# Patient Record
Sex: Male | Born: 1955 | Race: White | Hispanic: No | Marital: Single | Smoking: Current every day smoker
Health system: Southern US, Community
[De-identification: ages and names within clinical notes are randomized; demographics above are authoritative.]

## PROBLEM LIST (undated history)

## (undated) DIAGNOSIS — D649 Anemia, unspecified: Secondary | ICD-10-CM

## (undated) DIAGNOSIS — K219 Gastro-esophageal reflux disease without esophagitis: Secondary | ICD-10-CM

## (undated) DIAGNOSIS — K769 Liver disease, unspecified: Secondary | ICD-10-CM

## (undated) DIAGNOSIS — K7469 Other cirrhosis of liver: Secondary | ICD-10-CM

## (undated) DIAGNOSIS — I10 Essential (primary) hypertension: Secondary | ICD-10-CM

## (undated) HISTORY — PX: LIVER BIOPSY: SHX301

## (undated) HISTORY — DX: Other cirrhosis of liver: K74.69

## (undated) HISTORY — PX: COLONOSCOPY: SHX174

## (undated) HISTORY — DX: Gastro-esophageal reflux disease without esophagitis: K21.9

## (undated) HISTORY — DX: Liver disease, unspecified: K76.9

## (undated) HISTORY — PX: UPPER GASTROINTESTINAL ENDOSCOPY: SHX188

---

## 2005-04-06 ENCOUNTER — Ambulatory Visit (HOSPITAL_COMMUNITY): Admission: RE | Admit: 2005-04-06 | Discharge: 2005-04-06 | Payer: Self-pay | Admitting: Internal Medicine

## 2005-04-24 ENCOUNTER — Ambulatory Visit: Payer: Self-pay | Admitting: Internal Medicine

## 2005-05-31 ENCOUNTER — Encounter (INDEPENDENT_AMBULATORY_CARE_PROVIDER_SITE_OTHER): Payer: Self-pay | Admitting: Internal Medicine

## 2005-05-31 ENCOUNTER — Ambulatory Visit (HOSPITAL_COMMUNITY): Admission: RE | Admit: 2005-05-31 | Discharge: 2005-05-31 | Payer: Self-pay | Admitting: Internal Medicine

## 2005-05-31 ENCOUNTER — Ambulatory Visit: Payer: Self-pay | Admitting: Internal Medicine

## 2005-12-05 ENCOUNTER — Ambulatory Visit (HOSPITAL_COMMUNITY): Admission: RE | Admit: 2005-12-05 | Discharge: 2005-12-05 | Payer: Self-pay | Admitting: Internal Medicine

## 2005-12-05 ENCOUNTER — Encounter (INDEPENDENT_AMBULATORY_CARE_PROVIDER_SITE_OTHER): Payer: Self-pay | Admitting: Specialist

## 2005-12-05 ENCOUNTER — Ambulatory Visit: Payer: Self-pay | Admitting: Internal Medicine

## 2005-12-06 ENCOUNTER — Ambulatory Visit (HOSPITAL_COMMUNITY): Payer: Self-pay | Admitting: Internal Medicine

## 2005-12-06 ENCOUNTER — Encounter (HOSPITAL_COMMUNITY): Admission: RE | Admit: 2005-12-06 | Discharge: 2006-01-05 | Payer: Self-pay | Admitting: Internal Medicine

## 2005-12-06 ENCOUNTER — Ambulatory Visit (HOSPITAL_COMMUNITY): Admission: RE | Admit: 2005-12-06 | Discharge: 2005-12-06 | Payer: Self-pay | Admitting: Internal Medicine

## 2005-12-07 ENCOUNTER — Ambulatory Visit (HOSPITAL_COMMUNITY): Admission: RE | Admit: 2005-12-07 | Discharge: 2005-12-07 | Payer: Self-pay | Admitting: Internal Medicine

## 2005-12-12 ENCOUNTER — Ambulatory Visit: Payer: Self-pay | Admitting: Internal Medicine

## 2005-12-27 ENCOUNTER — Ambulatory Visit (HOSPITAL_COMMUNITY): Admission: RE | Admit: 2005-12-27 | Discharge: 2005-12-27 | Payer: Self-pay | Admitting: Internal Medicine

## 2005-12-27 ENCOUNTER — Encounter (INDEPENDENT_AMBULATORY_CARE_PROVIDER_SITE_OTHER): Payer: Self-pay | Admitting: Specialist

## 2006-01-01 ENCOUNTER — Ambulatory Visit (HOSPITAL_COMMUNITY): Admission: RE | Admit: 2006-01-01 | Discharge: 2006-01-01 | Payer: Self-pay | Admitting: Internal Medicine

## 2006-01-05 ENCOUNTER — Ambulatory Visit (HOSPITAL_COMMUNITY): Admission: RE | Admit: 2006-01-05 | Discharge: 2006-01-05 | Payer: Self-pay | Admitting: Internal Medicine

## 2006-01-10 ENCOUNTER — Ambulatory Visit (HOSPITAL_COMMUNITY): Admission: RE | Admit: 2006-01-10 | Discharge: 2006-01-10 | Payer: Self-pay | Admitting: Internal Medicine

## 2006-01-18 ENCOUNTER — Ambulatory Visit: Payer: Self-pay | Admitting: Internal Medicine

## 2006-01-18 ENCOUNTER — Ambulatory Visit (HOSPITAL_COMMUNITY): Admission: RE | Admit: 2006-01-18 | Discharge: 2006-01-18 | Payer: Self-pay | Admitting: Internal Medicine

## 2006-01-23 ENCOUNTER — Ambulatory Visit: Payer: Self-pay | Admitting: Internal Medicine

## 2006-01-28 ENCOUNTER — Ambulatory Visit (HOSPITAL_COMMUNITY): Admission: RE | Admit: 2006-01-28 | Discharge: 2006-01-28 | Payer: Self-pay | Admitting: Internal Medicine

## 2006-02-01 ENCOUNTER — Ambulatory Visit (HOSPITAL_COMMUNITY): Admission: RE | Admit: 2006-02-01 | Discharge: 2006-02-01 | Payer: Self-pay | Admitting: Internal Medicine

## 2006-02-05 ENCOUNTER — Ambulatory Visit (HOSPITAL_COMMUNITY): Admission: RE | Admit: 2006-02-05 | Discharge: 2006-02-05 | Payer: Self-pay | Admitting: Internal Medicine

## 2006-02-18 ENCOUNTER — Ambulatory Visit: Payer: Self-pay | Admitting: Internal Medicine

## 2006-02-18 ENCOUNTER — Ambulatory Visit (HOSPITAL_COMMUNITY): Admission: RE | Admit: 2006-02-18 | Discharge: 2006-02-18 | Payer: Self-pay | Admitting: Internal Medicine

## 2006-03-05 ENCOUNTER — Ambulatory Visit (HOSPITAL_COMMUNITY): Admission: RE | Admit: 2006-03-05 | Discharge: 2006-03-05 | Payer: Self-pay | Admitting: Internal Medicine

## 2006-03-18 ENCOUNTER — Ambulatory Visit (HOSPITAL_COMMUNITY): Admission: RE | Admit: 2006-03-18 | Discharge: 2006-03-18 | Payer: Self-pay | Admitting: Internal Medicine

## 2006-03-29 ENCOUNTER — Ambulatory Visit: Payer: Self-pay | Admitting: Internal Medicine

## 2006-03-29 ENCOUNTER — Ambulatory Visit (HOSPITAL_COMMUNITY): Admission: RE | Admit: 2006-03-29 | Discharge: 2006-03-29 | Payer: Self-pay | Admitting: Internal Medicine

## 2006-04-05 ENCOUNTER — Ambulatory Visit (HOSPITAL_COMMUNITY): Admission: RE | Admit: 2006-04-05 | Discharge: 2006-04-05 | Payer: Self-pay | Admitting: Gastroenterology

## 2006-04-12 ENCOUNTER — Ambulatory Visit: Payer: Self-pay | Admitting: Internal Medicine

## 2006-04-12 ENCOUNTER — Ambulatory Visit (HOSPITAL_COMMUNITY): Admission: RE | Admit: 2006-04-12 | Discharge: 2006-04-12 | Payer: Self-pay | Admitting: Internal Medicine

## 2006-04-25 ENCOUNTER — Ambulatory Visit: Payer: Self-pay | Admitting: Internal Medicine

## 2006-05-02 ENCOUNTER — Inpatient Hospital Stay (HOSPITAL_COMMUNITY): Admission: EM | Admit: 2006-05-02 | Discharge: 2006-05-04 | Payer: Self-pay | Admitting: Emergency Medicine

## 2006-05-17 ENCOUNTER — Ambulatory Visit (HOSPITAL_COMMUNITY): Admission: RE | Admit: 2006-05-17 | Discharge: 2006-05-17 | Payer: Self-pay | Admitting: Internal Medicine

## 2006-05-28 ENCOUNTER — Ambulatory Visit: Payer: Self-pay | Admitting: Internal Medicine

## 2006-06-20 ENCOUNTER — Ambulatory Visit: Payer: Self-pay | Admitting: Internal Medicine

## 2006-07-11 ENCOUNTER — Ambulatory Visit: Payer: Self-pay | Admitting: Internal Medicine

## 2006-08-06 HISTORY — PX: TIPS PROCEDURE: SHX808

## 2006-09-20 ENCOUNTER — Ambulatory Visit: Payer: Self-pay | Admitting: Internal Medicine

## 2006-09-24 ENCOUNTER — Ambulatory Visit (HOSPITAL_COMMUNITY): Admission: RE | Admit: 2006-09-24 | Discharge: 2006-09-24 | Payer: Self-pay | Admitting: Internal Medicine

## 2006-12-12 ENCOUNTER — Ambulatory Visit (HOSPITAL_COMMUNITY): Admission: RE | Admit: 2006-12-12 | Discharge: 2006-12-12 | Payer: Self-pay | Admitting: Internal Medicine

## 2007-01-09 ENCOUNTER — Ambulatory Visit: Payer: Self-pay | Admitting: Internal Medicine

## 2007-11-07 ENCOUNTER — Ambulatory Visit (HOSPITAL_COMMUNITY): Admission: RE | Admit: 2007-11-07 | Discharge: 2007-11-07 | Payer: Self-pay | Admitting: Internal Medicine

## 2007-11-12 ENCOUNTER — Ambulatory Visit (HOSPITAL_COMMUNITY): Admission: RE | Admit: 2007-11-12 | Discharge: 2007-11-12 | Payer: Self-pay | Admitting: Internal Medicine

## 2007-12-18 ENCOUNTER — Ambulatory Visit (HOSPITAL_COMMUNITY): Admission: RE | Admit: 2007-12-18 | Discharge: 2007-12-18 | Payer: Self-pay | Admitting: Internal Medicine

## 2008-10-28 ENCOUNTER — Encounter: Payer: Self-pay | Admitting: Urgent Care

## 2008-12-27 ENCOUNTER — Ambulatory Visit (HOSPITAL_COMMUNITY): Admission: RE | Admit: 2008-12-27 | Discharge: 2008-12-27 | Payer: Self-pay | Admitting: Internal Medicine

## 2009-09-15 ENCOUNTER — Ambulatory Visit (HOSPITAL_COMMUNITY): Admission: RE | Admit: 2009-09-15 | Discharge: 2009-09-15 | Payer: Self-pay | Admitting: Internal Medicine

## 2010-08-27 ENCOUNTER — Encounter: Payer: Self-pay | Admitting: Internal Medicine

## 2010-12-19 NOTE — Assessment & Plan Note (Signed)
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   SUBJECTIVE:  The patient is a 55 year old Caucasian male patient of Dr.  Ouida Sills, who is here for a scheduled visit.  He was last seen on September 20, 2006.  He has no complaints.  Review of his weight record reveals  that he has gained another 14 pounds.  All in all, he has gained 23  pounds this year.  He admits to not doing much physical activity but  eating a lot.  He is due for his hemoglobin A1c in the a.m. per Dr.  Ouida Sills.  He states he only had a mild episode of nausea since his last  visit, which was about 4 weeks ago.  He continues to complain of  insomnia.  When he was seen at [facility] he was given a  prescription for Ambien, which did not help.  He took 3 doses and  stopped.  He also complains of bloating and cramps and nausea with  lactulose.  He is taking less than the recommended dose.  He has not had  any episodes of confusion.  He denies melena or rectal bleeding.   He is on:  1. Spironolactone 100 mg daily.  2. Lasix 40 mg q.a.m.  3. Noroxin 400 mg daily.  4. B complex daily.  5. Lactulose 30 mL b.i.d.   PAST MEDICAL HISTORY:  1. He is diabetic, presently on diet.  2. Cirrhosis was diagnosed in 2006.  His hepatitis C antibody was      positive but HCV RNA repeatedly has been negative.  Other      biochemical markers were negative.  It was felt he has cryptogenic      cirrhosis.  He developed ascites requiring multiple taps,      eventually leading to TIPS in September 2007, and he has done      remarkably well.  He has had reversal of his muscle wasting.  3. History of mild hypertension.   ALLERGIES:  No known.   OBJECTIVE:  VITAL SIGNS:  Weight 241 pounds.  He is 6 feet tall.  Pulse  64 per  minute, blood pressure 130/72, temperature is 98.3.  GENERAL:  He does not have asterixis.  HEENT:  Conjunctivae are pink.  Sclerae are nonicteric.  Oropharyngeal  mucosa is normal.  Dentition in satisfactory condition.  NECK:  No neck masses or thyromegaly noted.  CHEST:  He does not have spider angiomata but he has bilateral  gynecomastia.  No tenderness noted on breast exam.  LUNGS:  Clear to auscultation.  CARDIAC:  Regular rhythm, normal S1 and S3.  No murmur or gallop noted.  ABDOMEN:  Full, soft and nontender.  Spleen is not palpable.  Liver edge  is indistinct, 5 cm below RCM.  Left lobe of liver is also prominent.  No shifting dullness noted.  It is soft and nontender.  He has  hepatomegaly with prominent left lobe.  He has inferior margin 5-6 cm  below RCM on inspiration.  Liver is nontender.  Spleen is not palpable.  EXTREMITIES:  He does not have shifting dullness or peripheral edema.   Hepatic ultrasound performed Dec 12, 2006:  Cirrhotic-appearing liver  with mild hepatomegaly, mild splenomegaly, cholelithiasis.  A Doppler  was also performed.  Velocities were as follows:  Proximal 204 cm/sec.,  on previous study of October was 150; mid 180 cm/sec., on previous study  was 198; and distally was 161 cm/sec., and previously was 193.  Slight  increase in velocity noted in proximal portion of TIPS compared to  previous study.  Distally it was slightly decreased.  It was felt to be  insignificant.  No ascites was noted on this study.  Similarly, no  ascites noted in this study.   ASSESSMENT:  Cryptogenic cirrhosis complicated by refractory ascites,  status post TIPS placement September 2007.  He has done extremely well  since then, although he had some difficulty with hepatic encephalopathy.  He is not taking his lactulose as recommended because of side effects.   PLAN:  Patient advised to take lactulose at least 30 mL a day.  New  prescription given for spironolactone 100 mg  daily, 30 with refills, for  Lasix 40 mg q.a.m., 30 with 5 refills, and Noroxin 400 mg, 30 with 5  refills.  He will have CBC, Chem-20, INR and serum ammonia with his  blood work in the a.m.  We will also check his alpha-fetoprotein.   Since he has an appointment to be seen at [facility] in August, I  will plan to see him in October 2008 unless new issues arise.       Lionel December, M.D.  Electronically Signed     NR/MEDQ  D:  01/09/2007  T:  01/10/2007  Job:  [id number]   cc:   Kingsley Callander. Ouida Sills, MD  Juel Burrow, PSE

## 2010-12-22 NOTE — Op Note (Signed)
NAME:  [name], [name]               ACCOUNT NO.:  [id number]   MEDICAL RECORD NO.:  [id number]          PATIENT TYPE:  AMB   LOCATION:  DAY                           FACILITY:  [facility]   PHYSICIAN:  Lionel December, M.D.    DATE OF BIRTH:  01-19-1956   DATE OF PROCEDURE:  02/01/2006  DATE OF DISCHARGE:                                 OPERATIVE REPORT   PROCEDURE:  Esophagogastroduodenoscopy.   INDICATIONS:  [name] [name] is a 55 year old Caucasian male with advanced cirrhosis  who is undergoing EGD looking for esophageal and/or gastric varices.  The  procedure risks were reviewed with the patient and informed consent was  obtained.   MEDS FOR CONSCIOUS SEDATION:  Benzocaine spray for pharyngeal topical  anesthesia, Demerol 50 mg IV, Versed 5 mg IV in divided dose.   FINDINGS:  The procedure was performed in the endoscopy suite.  The  patient's vital signs and O2 sat were monitored during the procedure and  remained stable.  The patient was placed in the left lateral position.  The  Olympus videoscope was passed via oropharynx without any difficulty into  esophagus.   Esophagus:  The mucosa of the proximal and middle segments was normal.  Distal 7-8 cm had four columns of esophageal varices, one at 6 o'clock was  most prominent, but no more than grade II, others were smaller and grade 1.  GE junction was located at 37 cm from the incisors.  No hernia was noted.   Stomach.  It was empty and distended very well insufflation.  The folds of  the proximal stomach were normal.  Examination of the mucosa revealed mosaic  pattern of the gastric body with focal patchy erythema with no erosions  and/or ulcers noted.  Pyloric channel was patent.  Angularis, fundus and  cardia were examined by retroflexing the scope and were normal.  No fundal  varices were identified.   Duodenum:  The bulbar mucosa was normal.  The scope was passed in the second  part of the duodenum where mucosa and folds were normal.   The endoscope was  withdrawn.  The patient tolerated the procedure well.   FINAL DIAGNOSIS:  1.  Four columns of grade 1 to II esophageal varices.  2.  Mild ported gastropathy but no evidence of fundal varices.   RECOMMENDATIONS:  He will resume his usual meds.  We will do an H. pylori  next time he has blood work to out H. pylori gastritis.   PLAN:  He will resume his usual meds.  We will do an H. pylori serology when  he has his next blood work.      Lionel December, M.D.  Electronically Signed     NR/MEDQ  D:  02/01/2006  T:  02/01/2006  Job:  [id number]   cc:   Kingsley Callander. Ouida Sills, MD  Fax: [phone]   Satira Mccallum, M.D.

## 2010-12-22 NOTE — Group Therapy Note (Signed)
NAME:  [name] [name], [name] [name]               ACCOUNT NO.:  [id number]   MEDICAL RECORD NO.:  [id number]          PATIENT TYPE:  INP   LOCATION:  A311                          FACILITY:  [facility]   PHYSICIAN:  Angus G. Renard Matter, MD   DATE OF BIRTH:  08-24-1955   DATE OF PROCEDURE:  DATE OF DISCHARGE:                                   PROGRESS NOTE   This patient has a history of cirrhosis, admitted with dizziness and  weakness.  He underwent TIPS surgery for cirrhosis and ascites.  Apparently  is feeling some better.  His liver enzymes were repeated; bilirubin 2.6,  SGOT 98, SGPT 55, total protein 7.1, albumin 2.1.   OBJECTIVE:  VITAL SIGNS:  Blood pressure 118/68, respirations 20, pulse 79,  temp 97.2.  Blood sugars range from 115 to 162.  There is no generalized  weakness.  LUNG:  Clear to P and A.  HEART:  Regular rhythm.  ABDOMEN:  There is abdominal distension.   ASSESSMENT:  Patient has a history of cirrhosis, elevated liver enzymes,  history of cholelithiasis, and elevated ammonia level without hepatic  encephalopathy.   PLAN:  I plan to continue a 4 g sodium diet, 60 to 70 g protein diet, Lasix  40 mg daily.  There is a possibility patient could be discharged over the  weekend.      Angus G. Renard Matter, MD  Electronically Signed     AGM/MEDQ  D:  05/04/2006  T:  05/06/2006  Job:  [id number]

## 2010-12-22 NOTE — Discharge Summary (Signed)
NAME:  [name] [name], [name] [name]               ACCOUNT NO.:  [id number]   MEDICAL RECORD NO.:  [id number]          PATIENT TYPE:  INP   LOCATION:  A311                          FACILITY:  [facility]   PHYSICIAN:  Kingsley Callander. Ouida Sills, MD       DATE OF BIRTH:  April 16, 1956   DATE OF ADMISSION:  05/02/2006  DATE OF DISCHARGE:  09/29/2007LH                                 DISCHARGE SUMMARY   DISCHARGE DIAGNOSES:  1. Cirrhosis.  2. Ascites, status post transjugular intrahepatic portosystemic shunt.  3. Type 2 diabetes.  4. Nausea and vomiting.   HOSPITAL COURSE:  This patient is a 55 year old male with cirrhosis who  presented with recurrent nausea and vomiting with weakness and dizziness.  White count was 11.4, temperature was 98, hemoglobin was 12, ammonia level  was elevated at 96. Had recently been started on lactulose. Felt that this  was contributing to his recurrent vomiting. Sodium was 133, potassium was  4.1. Liver function studies were elevated with an initial bilirubin of 2.1  with an SGOT of 52 and SGPT of 25. He had recently undergone a TIPS at  [facility] He was hospitalized and treated with antiemetics (Zofran) and  Protonix. Lactulose was continued in light of his elevated ammonia level. He  was seen in GI consultation by Dr. Karilyn Cota. A repeat white count was 11.7.  Liver function studies increased further with a rise in SGOT to 80 then 98  and a rise in his SGPT to 37 then 55. His bilirubin rose to 3.4 then dropped  to 2.6 by discharge. His ammonia level improved to 59.   His chest x-ray was normal. His INR was normal at 1.1. He was continued on  spironolactone and Lasix and Noroxin. Symptomatically much improved on the  29th and was felt to be stable for discharge.   DISCHARGE MEDICATIONS:  1. Aldactone 50 mg daily.  2. Lasix 20 mg daily.  3. Noroxin 400 mg daily.  4. Protonix 40 mg daily.  5. Lactulose 30 cc b.i.d.   He will be seen in followup by Dr. Karilyn Cota in 7-10 days. He was advised to  consume a 4 gm sodium, 70 gm protein diet.      Kingsley Callander. Ouida Sills, MD  Electronically Signed     ROF/MEDQ  D:  05/15/2006  T:  05/15/2006  Job:  [id number]   cc:   Lionel December, M.D.  [address]  [place] [address]

## 2010-12-22 NOTE — Op Note (Signed)
NAME:  [name], [name]               ACCOUNT NO.:  [id number]   MEDICAL RECORD NO.:  [id number]          PATIENT TYPE:  AMB   LOCATION:  DAY                           FACILITY:  [facility]   PHYSICIAN:  Lionel December, M.D.    DATE OF BIRTH:  May 27, 1956   DATE OF PROCEDURE:  02/05/2006  DATE OF DISCHARGE:                                 OPERATIVE REPORT   PROCEDURE:  Large Volume Abdominal Paracentasis.  [name] [name] is a 55 year old Caucasian male with biopsy-proven cirrhosis, possibly  secondary to prior hepatitis C and alcohol use, who presents with recurrent  ascites.  He is being evaluated at [facility] as he is a candidate for  OLT.  He is back on his spironolactone and Lasix but cannot tell any  difference.  Dr. Hermelinda Medicus is planning to increase diuretic therapy if his  renal function and potassium are normal.  He just had blood work done by  them yesterday.  He is back with tense abdomen and would like to have some  fluid removed.  His last tap was on January 28, 2006, when 10 L of fluid was  removed.  His white cell count, which initially was 600 in May and peaked to  1100, was down to 200, and he only had 8% neutrophils.   This is [name] [name]'s seventh abdominal paracentesis.  The Procedure and risks were  reviewed the patient, informed consent was obtained.   FINDINGS:  Procedure performed in day hospital.  The patient was placed in  supine position and tilted onto the right side by placing pillow on the left  flank.  Skin in the right midabdomen was prepped in the usual fashion with  Betadine solution and isolated using a sterile sheet.  Xylocaine 1% was  injected using a 25-gauge needle.  There was some oozing from the abdominal  wall.  Therefore, another site was picked just below that.  There was no  bleeding from this site.  Xylocaine 1% was injected down to parietal  peritoneum.  A skin-deep incision was made with scalpel blade and a 14-gauge  Caldwell needle was introduced into  peritoneal cavity without any difficulty  with free flow of creamy, hazy fluid just like before.  A sample was taken  and sent to the lab for routine studies.  A total of 10 L of fluid was  removed without any difficulty.  Puncture site was covered with Band-Aid and  pressure dressing was applied.  The patient tolerated the procedure well.   FINAL DIAGNOSES:  1.  Cirrhotic ascites, difficult to manage medically.  2.  Status post with removal of 10 L of fluid.   PLAN:  Fluid sent to the lab for cell count, differential, Gram stain,  aerobic and anaerobic cultures in blood culture bottles.  The patient can  remove pressure dressing in a.m.  He will continue to watch intake of salt.   He will call us should he reach a point that he feels he needs to have fluid  removed again.      Lionel December, M.D.  Electronically Signed  NR/MEDQ  D:  02/05/2006  T:  02/05/2006  Job:  [id number]   cc:   Kingsley Callander. Ouida Sills, MD  Fax: [phone]   Satira Mccallum, M.D.  Hepatology Section, [facility]

## 2010-12-22 NOTE — Op Note (Signed)
NAME:  [name] [name], [name] [name]               ACCOUNT NO.:  [id number]   MEDICAL RECORD NO.:  [id number]          PATIENT TYPE:  AMB   LOCATION:  DAY                           FACILITY:  [facility]   PHYSICIAN:  Lionel December, M.D.    DATE OF BIRTH:  1956/02/17   DATE OF PROCEDURE:  03/05/2006  DATE OF DISCHARGE:                                 OPERATIVE REPORT   PROCEDURE:  Large-volume abdominal paracentesis (10th tap since Dec 05, 2005).   [name] is 55 year old Caucasian male with cirrhotic ascites which has been  difficult to control with solid restriction and diuretic therapy.  His  diuretic therapy is currently being managed through hepatology Clinic at [facility]  however it is not working.  The patient called yesterday and requested that  fluid be removed for symptomatic relief.  He cannot rest and cannot sleep at  night because he feels tight and has a difficult time taking a deep breath.  He has not experienced fever, chills, nausea or vomiting.  He is being  evaluated for TIPS although he is not sure that he wants to go that route  given the risks.  The patient states that he needs to have fluid removed and  cannot wait any longer.   The procedure risks were reviewed with the patient and informed consent was  obtained.   FINDINGS:  The procedure performed in day hospital.  The patient was placed  in supine position.  He was noted to have tense ascites with positive fluid  thrill.  He was tilted onto the right side, placing a pillow under the left  flank.  The skin and left mid abdomen was prepped in the usual fashion with  Betadine solution and isolated using a sterile sheet.  1% Xylocaine was  injected in the skin and subcutaneous tissue down to parietal peritoneum.  A  total of 2 mL was injected.  A small skin deep incision was made with a  scalpel blade.  A 14-gauge Caldwell needle was introduced into the  peritoneal cavity without any difficulty with free flow of hazy greenish-  yellow  fluid just like before.  60 mL was saved and sent to the lab for  routine studies.  A total of 10 liters of fluid was removed without any  difficulty.  The patient's abdomen was much softer and he felt better.  The  puncture site was covered with a Band-Aid and a pressure dressing was  applied.  The patient tolerated the procedure well.   FINAL DIAGNOSIS:  Cirrhotic ascites refractory to medical therapy.  Status  post LVAP with removal of 10 liters of fluid.  Please note his last tap was  on February 18, 2006.   PLAN:  Fluid sent to the lab for cell count, Dif, Gram's stain, aerobic,  anaerobic cultures in blood culture bottles.  Next he will remove the  pressure dressing after 24 hours.   He will continue his planned visit to [facility] in the next few  __________.      Lionel December, M.D.  Electronically Signed  NR/MEDQ  D:  03/05/2006  T:  03/05/2006  Job:  [id number]   cc:   Kingsley Callander. Ouida Sills, MD  Fax: [phone]   Doretha Sou, PA-C  Hepatology Clinic, [facility]

## 2010-12-22 NOTE — Consult Note (Signed)
NAME:  [name] [name], [name] [name]               ACCOUNT NO.:  [id number]   MEDICAL RECORD NO.:  [id number]          PATIENT TYPE:  INP   LOCATION:  A311                          FACILITY:  [facility]   PHYSICIAN:  Lionel December, M.D.    DATE OF BIRTH:  September 13, 1955   DATE OF CONSULTATION:  05/03/2006  DATE OF DISCHARGE:                                   CONSULTATION   REASON FOR CONSULTATION:  Cirrhosis, nausea/vomiting.   REQUESTING PHYSICIAN:  Kingsley Callander. Ouida Sills, MD   Physician co-signing note is Dr. Lionel December.   HISTORY OF PRESENT ILLNESS:  Patient is a 55 year old Caucasian gentleman  with history of cirrhosis, who recently underwent TIPS on April 17, 2006  for refractory ascites.  He has undergone numerous large volume abdominal  paracentesis since approximately May of this year.  His post-TIPS placement  he has done fairly well, except for some early dizziness and weakness, which  subsided after a couple of days.  He was seen in the office by Dr. Karilyn Cota  late last week, felt to be doing well.  Over the weekend, he started having  increased dizziness and weakness.  He states he fell a couple of times,  because he lost his balance.  On Wednesday, after he started taking  Lactulose, he started having nausea and vomiting.  He has had several  episodes over the course of the last two days.  Denies any hematemesis.  Lactulose was started after elevated ammonia level of 77, was detected on  May 01, 2006.  Patient generally has one bowel movement daily.  He has  had seven doses of Lactulose and has not had any loose stools or increase in  frequency.  Denies any melena or bright red blood per rectum.  He feels a  little bloated.  Denies any abdominal pain.  Been eating well up until a  couple of days ago.   MEDICATIONS:  At home:  1. Lactulose 30 cc b.i.d. started on Wednesday.  2. Aldactone 100 mg b.i.d.  3. Lasix 40 mg t.i.d.  4. Noroxin 400 mg q.d.  5. Protonix 40 mg q.d.   ALLERGIES:   NO KNOWN DRUG ALLERGIES.   PAST MEDICAL HISTORY:  Diagnosed with cirrhosis late 2006.  He does have a  history of hepatitis C antibody which positive, however his HCV RNA came  back negative.  No prior history of heavy alcohol use and no alcohol use at  all in almost two years.  He is currently being evaluated at [facility].  Recent  TIPS placement as outlined above.  History of diabetes mellitus controlled  with diet and borderline hypertension.   FAMILY HISTORY:  Mother in good health.  Father has coronary artery disease,  deceased at age 74, was also diabetic.  He has two sisters who are good  health.   SOCIAL HISTORY:  Has never been married.  Does not have any children.  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  History of  social  alcohol use but none in almost two years, no steady alcohol intake in the  past.   REVIEW OF SYSTEMS:  See HPI for GI and constitutional.  CARDIOPULMONARY:  He  has early a.m. cough productive of white phlegm, no shortness of breath.  GU:  No dysuria or hematuria.   PHYSICAL EXAMINATION:  VITAL SIGNS:  T-Max 99.1, T-current 97.5, pulse 83,  respirations 16, blood pressure 91/50, weight 88.8 kg, height 72 inches.  GENERAL:  Pleasant, chronically ill-appearing Caucasian male in no acute  distress.  SKIN:  Warm and dry, no jaundice.  HEENT:  Sclerae nonicteric.  Oropharyngeal mucosa moist.  No  lymphadenopathy, thyromegaly.  CHEST:  Multiple spider angiomata over the anterior chest.  Lungs are clear  to auscultation.  CARDIAC:  Reveals regular rate and rhythm.  No murmurs, rubs or gallops.  ABDOMEN:  Full but symmetrical, positive bowel sounds.  Abdomen is very  soft, prominent to the left lobe of the liver, easily palpated 6 cm below  the right costal margin and in inspiration.  Spleen tip is also easily  palpable.  No hernia noted and no abdominal  bruits. No  significant ascites.  EXTREMITIES:  Trace pedal edema bilaterally.   LABORATORY:  White count 11,700, hemoglobin 11.3, hematocrit 31.5, platelets  182,000, eosinophil count 7%, sodium 135, potassium 4.3, BUN 22, creatinine  1.4, glucose 139.2.  Bilirubin was 1.3, two days ago, 2.1 yesterday, 3.4  today, and direct bilirubin today is 2.3.  His alkaline phosphatase went  from 130-145, AST was normal, 52 yesterday, 80 today.  ALT was normal, went  to 25 and 37.  Albumin 1.9.  Ammonia went from 96 down to 59.  Lipase is 40.  Chest x-ray reveals no acute disease.   IMPRESSION:  [name] [name] is 55 year old gentleman with a history of cirrhosis,  possibly related to prior HCV versus cryptogenic.  He presents with  weakness, dizziness, nausea, vomiting of two days duration.  Symptoms have  worsened after beginning Lactulose 2 days ago.  Denies any significant  abdominal pain.  It is not clear whether his nausea/vomiting is related to  Lactulose, although patient subjectively feels that he is.  Somewhat  concerning is the changes in his LFT parameters with increasing  transaminases from normal, and increased total bilirubin, although this was  mostly indirect and could be related to his cirrhosis rather than biliary  obstruction.  He does have a history of cholelithiasis.  However, given the  patient not having any significant abdominal  pain, this makes acute  cholecystectomy on biliary etiology less likely.  He has an elevated ammonia  level on admission which is improved; however, patient has not had any signs  or symptoms or hepatic encephalopathy.  He does have a history of positive  cell count on abdominal paracentesis back in May of 2007, but cultures were  negative.  He did receive Rocephin, and more recently his cell counts have  been fine.  He had a very low-grade temp of 99 his admission but otherwise  no other symptoms suggestive of spontaneous bacterial peritonitis.   RECOMMENDATION: 1. We  will be discussing this case with Dr. Karilyn Cota this morning.  We may      need to consider abdominal ultrasound to rule out acute cholecystitis.      Patient tells me he is also due to have his TIPS re-evaluated via      ultrasound next week, and we may facilitate that at the same  time.  2. Will recheck his LFTs in the morning.  3. Further recommendations to follow.      Tana Coast, P.A.      Lionel December, M.D.  Electronically Signed    LL/MEDQ  D:  05/03/2006  T:  05/03/2006  Job:  [id number]   cc:   Kingsley Callander. Ouida Sills, MD  Fax: [phone]

## 2010-12-22 NOTE — Op Note (Signed)
NAME:  [name] [name], [name] [name]               ACCOUNT NO.:  [id number]   MEDICAL RECORD NO.:  [id number]          PATIENT TYPE:  AMB   LOCATION:  DAY                           FACILITY:  [facility]   PHYSICIAN:  [name] [name], M.D.    DATE OF BIRTH:  September 27, 1955   DATE OF PROCEDURE:  12/27/2005  DATE OF DISCHARGE:                                 OPERATIVE REPORT   PROCEDURE:  Large-volume abdominal paracentesis.   INDICATIONS:  [name] [name] is a 55 year old Caucasian male with a biopsy-proven  cirrhosis who presented with new onset of ascites 3 weeks ago.  He had  elevated white cell count.  He was treated with IV Rocephin however, his  white cell count went up to 1100. Cultures have remained negative.  We asked  the lab to keep cultures for 2 weeks and they were cell negative. AFB  cultures are still pending although smear was negative.  He has been  maintained on low-dose Lasix and spironolactone.  His ascites has  reaccumulated and he feels miserable.  He is undergoing repeat tap both for  diagnostic and therapeutic purposes.  Procedure risks were reviewed with the  patient, informed consent was obtained.   FINDINGS:  Procedure performed in day hospital.  The patient was placed in  supine position and tilted onto the left side by placing pillow under the  right flank.  Skin in left mid abdomen was prepped in the usual fashion with  Betadine solution and isolated using sterile sheet.  1% Xylocaine was  injected in skin and subcutaneous tissue with 25-gauge needle and deeper  injection was made was made with 22 gauge needle.  Total of 3 mL of  Xylocaine was injected.  Small skin deep incision was made with scalpel  blade.  A 14 gauge Caldwell needle was introduced in peritoneal cavity  without any difficulty.  There was free flow of yellowish brown hazy fluid  just like before.  Sample was saved and sent to lab.  Total of 9 liters of  fluid was removed.  Puncture site was covered with Band-Aid and  pressure  dressing was applied.  The patient's abdomen was much softer and he felt  better.   FINAL DIAGNOSIS:  Recurrent ascites of recent onset with elevated white  cells but negative cultures status post LV AP with removal of 9 liters of  fluid.   I am concerned that he could have atypical infection.  His serum albumin few  weeks ago was 3.4. His Doppler ultrasound was negative for veno occlusive  disease or portal vein thrombosis.   PLAN:  Fluid sent to the lab for routine studies and also for repeat  cytology.   MET-7 was done along with HCV RNA by PCR qualitative.   He will be scheduled for abdominopelvic CT next week.   The patient was advised to increase his spironolactone to 100 mg b.i.d.   However, his serum potassium was reported to be 5.8.  Sodium was 130 and  creatinine was 1.3.  I contacted the patient and asked him to discontinue  his Lasix and spironolactone.  The patient will also be undergoing EGD at some point in the future to find  out if he has varices and what grade.  We will also send the paperwork for  him to be seen at transplant center which would be a [facility].   He may need that diagnostic peritoneoscopy and he may also benefit from  TIPS.      [name] [name], M.D.  Electronically Signed     NR/MEDQ  D:  12/27/2005  T:  12/28/2005  Job:  [id number]   cc:   [name] [name]. [name] Sills, MD  Fax: [phone]

## 2010-12-22 NOTE — Op Note (Signed)
NAME:  [name] [name], [name] [name]               ACCOUNT NO.:  [id number]   MEDICAL RECORD NO.:  [id number]          PATIENT TYPE:  AMB   LOCATION:  DAY                           FACILITY:  [facility]   PHYSICIAN:  Kassie Mends, M.D.      DATE OF BIRTH:  [birth year]   DATE OF PROCEDURE:  04/05/2006  DATE OF DISCHARGE:                                 OPERATIVE REPORT   PROCEDURE:  Large volume abdominal paracentesis.   INDICATIONS:  Patient is a 55 year old Caucasian male with cirrhotic  ascites, which has been difficult to manage medically.  He requires repeated  abdominal paracentesis for tense ascites.  He has been generally requiring  approximately every two weeks.  His last paracentesis was by Dr. Karilyn Cota on  March 29, 2006, at which time he had 10 liters of fluid drawn.  At that  time, he received 50 gm of albumin IV during the procedure, at the  recommendation of Dr. Deniece Ree, his hepatologist at [facility].  He called the  office yesterday complaining of recurrent abdominal fluid and mild shortness  of breath.   PROCEDURE:  The procedure and risks were reviewed with the patient.  Informed consent was obtained.   PHYSICAL EXAMINATION:  VITAL SIGNS:  Prior to the procedure, his O2 sat was  98% on room air.  His temp was 97.2.  Pulse was 86.  Respirations 20.  Blood  pressure 128/80.  Weight 226 pounds.  GENERAL:  A pleasant, chronically ill-appearing male in no acute distress.  SKIN:  Warm and dry.  No jaundice.  CHEST:  Lungs are clear to auscultation.  CARDIAC:  Regular rate and rhythm.  No murmurs, rubs or gallops.  ABDOMEN:  Positive bowel sounds.  Abdomen is distended and tense.  Positive  fluid wave.  Nontender.  EXTREMITIES:  Trace lower extremity edema bilaterally.   MEDICATIONS:  Xylocaine 1% was used for topical anesthesia, 3 cc.   FINDINGS:  Procedure was performed in short stay center.  Patient was placed  in supine position, tilted to the right side.  Skin in the right mid abdomen  was  prepped in the usual fashion with Betadine solution, isolated using a  sterile sheath.  Xylocaine 1% was injected into the skin and subcutaneous  tissue down to the peritoneal cavity with a total of 3 cc used.  The  peritoneal cavity was located with the same 25 gauge needle.  Next, a 14  gauge Caldwell needle was easily passed into the peritoneal cavity with free  flow of hazy, yellowish-colored fluid.  A total of 12.4 liters of fluid were  removed.  The patient's abdomen was very soft.  He felt better.  The  puncture site was covered with a Band-Aid, and pressure dressing was  applied.  The patient was given 50 gm of albumin IV during the procedure.  The patient tolerated the procedure well.  Post procedure weight and vital  signs to be obtained.   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  PLAN:  1. Fluid was sent to the lab for cell count with differential.  Gram's      stain, anaerobic cultures, aerobic cultures, and blood cultures in      bottles.  2. Continue a 4 gm sodium diet.  3. He will resume his home medications as before, which include      spironolactone 200 mg daily and Lasix 80 mg daily as tolerated.      Patient has been adjusting his dose, as he has been instructed by [facility].      He will continue Noroxin 400 mg daily and Protonix 40 mg daily as well.      He will call us when his ascites has reaccumulated.  If he develops any      problems over the weekend, he has been told to notify the on-call      physician, Dr. Kassie Mends.      Tana Coast, P.A.      Kassie Mends, M.D.  Electronically Signed    LL/MEDQ  D:  04/05/2006  T:  04/05/2006  Job:  [id number]   cc:   Carlota Raspberry, PA-C

## 2010-12-22 NOTE — Op Note (Signed)
NAME:  [name] [name], [name] [name]               ACCOUNT NO.:  [id number]   MEDICAL RECORD NO.:  [id number]          PATIENT TYPE:  AMB   LOCATION:  DAY                           FACILITY:  [facility]   PHYSICIAN:  Lionel December, M.D.    DATE OF BIRTH:  07-23-1956   DATE OF PROCEDURE:  01/28/2006  DATE OF DISCHARGE:                                 OPERATIVE REPORT   PROCEDURE:  Large-volume abdominal paracentesis.   INDICATIONS FOR PROCEDURE:  [name] [name] is a 55 year old Caucasian male with biopsy-  proven cirrhosis who has a recurrent ascites requiring tap every 1-2 weeks.  He has failed spirolactone twice because of hyperemia.  His abdomen is tight  and he requests that some fluid be removed for symptomatic relief.  He has  an appointment to be seen at [facility] in 2 days for consideration of  TIPS and he is also a candidate for OLT.  Procedure risks were reviewed the  patient and informed consent was obtained.   FINDINGS:  Procedure performed in Day Hospital.  The patient was placed in  supine position and was tilted on the left side by placing pillow on the  right side.  The skin and left mid abdomen was prepped in usual fashion with  Betadine solution and isolated using sterile sheet.  Xylocaine 1% was  injected into skin and subcutaneous tissue with 25-gauge needle.  A small  nick was made in the skin to facilitate entry of 14-gauge Caldwell needle  which was easily passed into the peritoneal cavity.  There was a free flow  of hazy fluid just like before.  Sample was saved for routine studies.  Total of 10 liters of fluid was removed without any difficulty.  The  patient's abdomen felt much softer.  Puncture site was covered with Band-Aid  and pressure dressing was applied.  The patient tolerated the procedure  well.   FINAL DIAGNOSIS:  1.  Recurrent ascites, failed medical therapy.  2.  Status post LVAP with removal of 10 liters of fluid.   PLAN:  Fluid sent to lab for Gram stain, aerobic,  anaerobic cultures and  blood culture bottles, cell count and differential.   The patient will call us when he needs repeat tap.      Lionel December, M.D.  Electronically Signed    NR/MEDQ  D:  01/28/2006  T:  01/28/2006  Job:  [id number]   cc:   Kingsley Callander. Ouida Sills, MD  Fax: [phone]

## 2010-12-22 NOTE — Op Note (Signed)
NAME:  [name] [name], [name] [name]               ACCOUNT NO.:  [id number]   MEDICAL RECORD NO.:  [id number]          PATIENT TYPE:  AMB   LOCATION:  DAY                           FACILITY:  [facility]   PHYSICIAN:  Lionel December, M.D.    DATE OF BIRTH:  04/20/1956   DATE OF PROCEDURE:  02/18/2006  DATE OF DISCHARGE:                                 OPERATIVE REPORT   PROCEDURE:  Large-volume abdominal paracentesis.   [name] is a 55 year old Caucasian male with known cirrhotic ascites which has  been difficult to control medically.  The patient called yesterday  complaining of tense ascites and he was having difficult time resting.  He  is therefore here to have fluid removed for symptomatic relief.  This is the  patient's ninth abdominal paracenteses since Dec 05, 2005.  His diuretic  therapy is presently managed  through [facility].   Procedure and risks were reviewed with the patient and  informed consent was  obtained.   PREOP MEDS:  1% Xylocaine for cutaneous subcutaneous topical anesthesia.   Procedure was reviewed with the patient and informed consent was obtained.   FINDINGS:  Procedure performed in day hospital.  The patient was placed in  supine position and tilted onto the left placing pillow under the right  flank.  The skin in left mid abdomen was prepped in usual fashion with  Betadine solution and isolated using sterile sheet.  1% Xylocaine was  injected in the skin and subcutaneous tissue down to parietal peritoneum.  Small skin deep incision was made using scalpel blade to facilitate entry of  14-gauge Caldwell needle which was easily introduced into peritoneal cavity  with free flow of hazy fluid like before.  Sample was taken and sent to the  lab.  A total of 10.5 liters of fluid was removed without any difficulty.  The patient felt a lot better and his abdomen was much softer.  Puncture  site was covered with Band-Aid and pressure dressing was applied.  The  patient tolerated the  procedure well.   FINAL DIAGNOSIS:  Recurrent cirrhotic ascites difficult to manage medically.  Status post large-volume abdominal paracentesis with removal of 10.5 liters  of fluid.   PLAN:  Fluid sent to the lab for Gram stain, cell count, dif, aerobic,  anaerobic cultures in blood culture bottles.   The patient will keep his appointment at [facility].      Lionel December, M.D.  Electronically Signed     NR/MEDQ  D:  02/18/2006  T:  02/18/2006  Job:  [id number]   cc:   Kingsley Callander. Ouida Sills, MD  Fax: [phone]   Cristela Felt, PA  Hepatology [facility] - [facility]

## 2010-12-22 NOTE — Op Note (Signed)
NAME:  [name] [name], [name] [name]               ACCOUNT NO.:  [id number]   MEDICAL RECORD NO.:  [id number]          PATIENT TYPE:  AMB   LOCATION:  DAY                           FACILITY:  [facility]   PHYSICIAN:  Lionel December, M.D.    DATE OF BIRTH:  May 28, 1956   DATE OF PROCEDURE:  DATE OF DISCHARGE:                                 OPERATIVE REPORT   PROCEDURE:  Large-volume abdominal paracentesis.   INDICATIONS:  [name] [name] is a 55 year old Caucasian male with cirrhotic ascites  difficult to manage medically.  He is returning with tense ascites.  He is  requiring tap generally every 2 weeks.  He has been evaluated for TIPS at  [facility] and is scheduled for about 3 weeks from now.  He was seen by  his hepatologist Dr. Hollice Espy earlier in the week and they recommended, that  we should give him IV albumin with LVP.   Procedure and risks were reviewed with the patient and informed consent was  obtained.   FINDINGS:  Procedure performed in the Day Hospital.  The patient was placed  in supine position.  He had tense ascites with fluid thrill.  He also had 2+  edema to his lower extremities.  Skin and right mid abdomen was prepped in  the usual fashion with Betadine solution and isolated using sterile sheet.  Then 1% Xylocaine was injected into the skin and subcutaneous tissue with 25-  gauge needle.  Deeper injection were made into parietal peritoneum with the  same needle.  Small skin deep incision was made with the scalpel blade.  A  14-gauge Caldwell needle was introduced into the peritoneal cavity without  any difficulty with free flow of hazy pale yellow colored fluid.  Sample was  taken for routine studies.   A total of 10 liters of fluid was removed.  He was also given 50 grams of  albumin IV.  The puncture site was covered with Band-Aid and pressure  dressing was applied.  The patient tolerated the procedure well.   FINAL DIAGNOSIS:  1. 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  Status  post LVAP with      removal of 10 liters of fluid.  2. The patient was given of 50 grams of albumin IV during the procedure.   PLAN:  1. Fluid sent to lab for cell count, differential, Gram stain, aerobic and      anaerobic cultures in blood culture bottles.      Will also repeat his protein level.  Please note we did check his      triglyceride levels before and this was not elevated.  Fluid sent to      lab for routine studies along with protein level.  2. The patient will call us when ascites has reaccumulated and he is      symptomatic.      Lionel December, M.D.  Electronically Signed     NR/MEDQ  D:  03/29/2006  T:  03/30/2006  Job:  [id number]   cc:   Clinton Quant, PA  GI Division  [facility]  [address]  [place]   Roy O. Ouida Sills, MD  Fax: [phone]

## 2010-12-22 NOTE — Op Note (Signed)
NAME:  [name] [name], [name] [name]               ACCOUNT NO.:  [id number]   MEDICAL RECORD NO.:  [id number]          PATIENT TYPE:  AMB   LOCATION:  DAY                           FACILITY:  [facility]   PHYSICIAN:  Lionel December, M.D.    DATE OF BIRTH:  1955-09-14   DATE OF PROCEDURE:  05/31/2005  DATE OF DISCHARGE:                                 OPERATIVE REPORT   PROCEDURE:  Percutaneous liver biopsy.   INDICATION:  [name] [name] is a 55 year old Caucasian male who was recently found to  have hepatosplenomegaly by Dr. Ouida Sills. He has elevated transaminases. His  hepatitis C virus antibody was positive. However, his HCV RNA by PCR is  negative. 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 He  is undergoing diagnostic liver biopsy, both for diagnostic purposes as well  as staging of his liver disease. Procedures were reviewed the patient, and  informed consent was obtained. His PT and PTT were normal. His bleeding time  is 7 minutes and platelet count is mildly decreased at 111,000.   FINDINGS:  Procedure performed in endoscopy suite. The patient had an  ultrasound this morning and site was already marked. The patient was placed  in supine position. Skin was prepped in usual fashion with Betadine solution  and isolated using sterile sheet. One percent Xylocaine was injected in the  skin and subcutaneous tissue. Deeper injection was made with the patient  holding his breath. Small skin deep incision was made to facilitate entry of  the liver biopsy needle. Sounding was done with trocar of 22-gauge spinal  needle. Liver biopsy was performed using Monopty needle. Two passes were  made with patient holding his breath at end inspiration. Adequate liver  tissue was obtained. Puncture site was covered with Band-Aid, and the  patient asked to lie on his right side. The patient tolerated the procedure  well.   FINAL DIAGNOSIS:  Hepatomegaly, cirrhosis suspected. Status post  percutaneous liver  biopsy.   PLAN:  He will be given a single dose of Demerol 50 mg IM. He will have  periodic vital signs per protocol. H&H will be repeated in about two to  three hours. Unless he has problems, he will be going home this afternoon.      Lionel December, M.D.  Electronically Signed     NR/MEDQ  D:  05/31/2005  T:  05/31/2005  Job:  [id number]   cc:   Kingsley Callander. Ouida Sills, MD  Fax: [phone]

## 2010-12-22 NOTE — Op Note (Signed)
NAME:  [name] [name], [name] [name]               ACCOUNT NO.:  [id number]   MEDICAL RECORD NO.:  [id number]          PATIENT TYPE:  AMB   LOCATION:  DAY                           FACILITY:  [facility]   PHYSICIAN:  Lionel December, M.D.    DATE OF BIRTH:  1955-10-04   DATE OF PROCEDURE:  01/18/2006  DATE OF DISCHARGE:                                 OPERATIVE REPORT   PROCEDURE:  Large volume abdominal paracentesis.   INDICATIONS:  Patient is a 55 year old Caucasian male with cirrhosis with  recent onset of leukocytic ascites with negative cultures, who has had  difficulty with reaccumulation of ascites.  He has had multiple abdominal  paracentesis over the last six weeks.  Patient called the office today  stating that he was having discomfort and difficulty breathing when he laid  flat; therefore, he came in to undergo LVAP.  The last abdominal  paracentesis was on January 10, 2006, done through radiology with removal of 11  liters of fluid.  The procedure risks were reviewed with the patient, and  informed consent was obtained.   PHYSICAL EXAMINATION:  VITAL SIGNS:  Prior to procedure, his O2 sats were  99%, temp 97.3, pulse 102, respirations 18, blood pressure 163/107.  Weight  212.  GENERAL:  A pleasant, chronically ill-appearing male in no acute distress.  SKIN:  Warm and dry.  No jaundice.  CHEST:  Lungs are clear to auscultation.  CARDIAC:  Regular rate and rhythm.  No murmurs, rubs or gallops.  ABDOMEN:  Positive bowel sounds.  Distended and tense, nontender.  EXTREMITIES:  Lower extremities:  No edema.   MEDICATIONS:  Lidocaine 1% used for topical anesthesia.   FINDINGS:  Procedure performed in short stay center.  Patient was placed in  a supine position and tilted to the right side.  Skin in the right mid  abdomen was prepped in the usual fashion with Betadine solution, isolated  using a sterile sheet.  Lidocaine 1% was injected into the skin and  subcutaneous tissue down to the peritoneal  cavity with a total of 3 cc used.  A 14 gauge Caldwell needle was easily passed into the peritoneal cavity with  free flow of hazy, yellowish-colored fluid.  A total of 12 liters of fluid  were removed.  The patient's abdomen was very soft, and he felt better.  The  puncture site was covered with the Band-Aid, and pressure dressing was  applied.  The patient tolerated the procedure well.   Post procedure, his blood pressure was 115/80, pulse 98, respirations 18.  Weight 188.5 pounds.   FINAL DIAGNOSES:  1.  Recurrent ascites.  2.  Status post large volume abdominal paracentesis with removal of 12      liters of fluid.   PLAN:  1.  Fluid was sent to the lab for cell count with differential, Gram's      stain, anaerobic cultures, and blood cultures, bottle to be held for two      weeks.  2.  He is asked to continue a 4 gm sodium diet with 1500 cc fluid  restriction daily.  His Lasix was increased to 40 mg daily and the      addition of spironolactone 100 mg daily, as per Dr. Patty Sermons request.      He will have a repeat MET-7 on Tuesday, as he did develop hyperkalemia      in the past.  He has an appointment on Wednesday, June 20th, with Dr.      Karilyn Cota.  He is asked to notify us if he develops fever, bleeding,      abdominal pain, severe weakness, or reaccumulation of tense ascites.      Tana Coast, P.A.      Lionel December, M.D.  Electronically Signed    LL/MEDQ  D:  01/18/2006  T:  01/18/2006  Job:  [id number]

## 2010-12-22 NOTE — Op Note (Signed)
NAME:  [name] [name], [name] [name]               ACCOUNT NO.:  [id number]   MEDICAL RECORD NO.:  [id number]          PATIENT TYPE:  AMB   LOCATION:  DAY                           FACILITY:  [facility]   PHYSICIAN:  Lionel December, M.D.    DATE OF BIRTH:  08-May-1956   DATE OF PROCEDURE:  03/18/2006  DATE OF DISCHARGE:                                 OPERATIVE REPORT   PROCEDURE:  Large-volume abdominal paracenteses   INDICATIONS:  [name] [name] is A 55 year old Caucasian male with cirrhotic ascites  which was been difficult to control medically.  He is on solid restriction.  He is on 2 to 4 grams sodium diet a day.  He has also been on the Lasix 80  mg and spirolactone 200 mg daily.  He was having some side effects and did  not take therapy for a few days.  His last LVAP was 2 weeks ago with removal  of 10 liters of fluid.  He is presently undergoing evaluation for TIPS  placement at [facility].  Eventually he also appears to be a candidate  for OLT.  Procedure and risks were reviewed with the patient and informed  consent was obtained.   FINDINGS:  Procedure performed in day hospital.  The patient was placed in  the supine position.  Abdomen is prepped in the usual fashion.  The patient  was placed in supine position.  The skin in left mid abdomen was prepped in  the usual fashion with Betadine solution and isolated using sterile sheet.  Then 1% Xylocaine was injected into the skin and subcutaneous tissue down to  parietal peritoneum.  A total of 4 mL was injected.  A small skin deep  incision was made to facilitate entry of a 14-gauge Caldwell needle which  was easily passed into peritoneal cavity with free flow of whitish hazy  fluid just like before.  Sample was saved for routine studies.  A total of  10 liters of fluid was removed without any difficulty.  Puncture site was  covered with Band-Aid and pressure dressing was applied; and the patient was  asked to lie on his right side.  He tolerated the  procedure well.   FINAL DIAGNOSIS:  1. Cirrhotic ascites which is refractory to medical therapy.  2. Status post large volume abdominal paracentesis with removal of 10      liters of fluid.  3. Fluid sent to the lab for Gram stain, cell count, differential, aerobic      and anaerobic cultures in blood culture bottles.   The patient advised to take it easy today and he can remove the pressure  dressing in a.m. but leave the band-aid on until it falls off spontaneously.  Next, he will call us when he thinks he needs another tap.  He has an  appointment with Ms. Satira Mccallum, [facility] later this  month.      Lionel December, M.D.  Electronically Signed     NR/MEDQ  D:  03/18/2006  T:  03/19/2006  Job:  [id number]   cc:   Nicholos Johns  Trudie Buckler  Hepatology Section  [facility]  [place]  [place]

## 2010-12-22 NOTE — Op Note (Signed)
NAME:  [name] [name], [name] [name]               ACCOUNT NO.:  [id number]   MEDICAL RECORD NO.:  [id number]          PATIENT TYPE:  AMB   LOCATION:  DAY                           FACILITY:  [facility]   PHYSICIAN:  Lionel December, M.D.    DATE OF BIRTH:  1956-04-18   DATE OF PROCEDURE:  12/05/2005  DATE OF DISCHARGE:                                 OPERATIVE REPORT   PROCEDURE:  Diagnostic and therapeutic abdominal paracentesis.   INDICATIONS:  [name] is 55 year old Caucasian male who had liver biopsy in  October last year confirming cirrhosis.  It was felt to be primarily  secondary to ethanol use.  His hep-C antibody was positive but HCV RNA by  PCR was negative implying that must have had prior infection with  spontaneous clearance.  He has not had any alcohol in about 9 months.  He  presented Dr. Ouida Sills earlier this week with new onset of tense ascites.  His  CBC and chemistry panel are essentially normal except alkaline phosphatase  of 124, albumin of 3.3.  His AST is 23, ALT 15, BUN 13, creatinine 0.9.  He  was begun on spironolactone 50 mg b.i.d. and Lasix 20 mg three times a week,  but he has not felt any better.  He is also on low salt diet.  He is  therefore here to have large-volume abdominal paracentesis.  Procedure risks  were reviewed the patient, informed consent was obtained.   FINDINGS:  Procedure performed at bedside.  The patient was placed in supine  position and tilted on the right by putting pillow under the left flank.  Skin right mid abdomen was prepped in the usual fashion with Betadine  solution and isolated using sterile sheet.  1% Xylocaine was injected skin  and subcutaneous tissue and deeper injection were made with the same needle.  Small nick was made in skin to facilitate entry of 14-gauge Caldwell needle  which was easily introduced in the peritoneal cavity with free flow of  yellowish somewhat hazy fluid.  Sample was taken, sent to the lab for  routine studies.  Total of  7 liters of fluid was removed without any  difficulty.  Puncture site was covered with Band-Aid and pressure dressing  applied.  The patient's abdomen was much softer and he felt better.   FINAL DIAGNOSIS:  Status post LV AP with removal of 7 liters of fluid.   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   PLAN:  We will obtain the results of cell count and before he is discharged  from the facility.  If his cell count is high he will need an antibiotic  therapy.  Otherwise he will continue spironolactone and Lasix as above as  well as low salt diet and he will return for OV on 12/12/2005.   Hepatic ultrasound with Doppler study to look for patency of portal and  hepatic veins and also rule out focal hepatic mass.      Lionel December, M.D.  Electronically Signed  NR/MEDQ  D:  12/05/2005  T:  12/06/2005  Job:  [id number]   cc:   Kingsley Callander. Ouida Sills, MD  Fax: [phone]

## 2010-12-22 NOTE — H&P (Signed)
NAME:  [name] [name], [name] [name]               ACCOUNT NO.:  [id number]   MEDICAL RECORD NO.:  [id number]          PATIENT TYPE:  INP   LOCATION:  A311                          FACILITY:  [facility]   PHYSICIAN:  Tesfaye D. Felecia Shelling, MD   DATE OF BIRTH:  June 20, 1956   DATE OF ADMISSION:  05/02/2006  DATE OF DISCHARGE:  LH                                HISTORY & PHYSICAL   CHIEF COMPLAINT:  Dizziness and weakness.   HISTORY OF PRESENT ILLNESS:  This is a 55 year old male patient of Dr. [name] with history of liver cirrhosis, came to emergency room with the above  complaints.  The patient was recently discharged from [facility]  after he underwent PIPS surgery for liver cirrhosis and ascites.  He has  been previously followed by Dr. Dionicia Abler and had frequent abdominal tapping for  ascites.  Since he has been discharged from [facility] the patient has been  generally weak.  He was started on Lactulose 2 days back.  The patient,  however, was very nauseated after taking the lactulose.  He tried to take  about 2 doses.  However, there was no change in his bowel movement.  His  conditioned started getting worse this morning.  The patient became more  dizzy and weak.  He came back to the emergency room where he was evaluated.  During evaluation the patient was found to have elevated bilirubin level.  He was then admitted for further treatment.   REVIEW OF SYSTEMS:  The patient has no headache, fever, chills, cough, chest  pain, abdominal pain, dysuria, urgency or frequency of urination.   PAST MEDICAL HISTORY:  1. Liver cirrhosis.  2. Diabetes mellitus diet controlled.   CURRENT MEDICATIONS:  1. Lactulose 30 cc p.o. b.i.d.  2. Spironolactone 100 mg p.o. b.i.d.  3. Lasix 20 g b.i.d.  4. Protonix 40 mg p.o. b.i.d.  5. Noroxin 400 mg p.o. daily.   SOCIAL HISTORY:  The patient is single and lives alone.  He is currently  disabled due to his illness. The patient smokes about 1-2 pack of  cigarettes  per day.  He has no history of heavy alcohol abuse or substance abuse.   PHYSICAL EXAMINATION:  GENERAL:  The patient is alert, awake and chronically  sick looking.  VITALS:  Blood pressure 117/70, pulse 84, respiratory rate 20, temperature  98 degrees Fahrenheit.  HEENT:  Pupils are equal, reactive.  NECK:  Supple.  CHEST: Decreased air entry, few rhonchi.  CARDIOVASCULAR:  First and secondary sounds heard, regular.  ABDOMEN:  Soft and lax.  Bowel sounds positive.  The patient has significant  ascites.  The patient liver is not palpable.  EXTREMITIES:  1+ edema.   LABORATORY DATA:  Labs on admission CBC:  WBC 11.4, hemoglobin 12.0,  hematocrit 33.9, platelet 207, sodium 153, potassium 4.1, chloride 101,  carbon dioxide 25, sodium 138, BUN 22, creatinine 1.2.  Total bilirubin 2.1,  indirect 0.7 and direct 1.4, alkaline phosphatase 145, AST 52, ALT 25.  Total protein 7.3.  Albumin 2.2, calcium 9.0, ammonia 96, lap is  40.   ASSESSMENT:  This is a 55 year old male patient with known case of liver  cirrhosis with ascites who recently had PIPS, came with dizziness and  generalized weakness. The patient has elevated ammonia level. However, his  mental status and his memory is not effected.  The patient is not obviously  in hepatic encephalopathy.   PLAN:  Will continue the patient on his regular medications.  Will start him  on Zofran p.r.n. for nausea, vomiting.  Will continue on lactulose and  increase the dose 30 cc's q. 4 h until he develops loose stool and then will  back off maybe to b.i.d. and will do GI consult.      Tesfaye D. Felecia Shelling, MD  Electronically Signed     TDF/MEDQ  D:  05/02/2006  T:  05/03/2006  Job:  [id number]

## 2010-12-22 NOTE — Op Note (Signed)
NAME:  [name] [name], [name] [name]               ACCOUNT NO.:  [id number]   MEDICAL RECORD NO.:  [id number]          PATIENT TYPE:  AMB   LOCATION:  DAY                           FACILITY:  [facility]   PHYSICIAN:  Lionel December, M.D.    DATE OF BIRTH:  [birth year]   DATE OF PROCEDURE:  01/05/2006  DATE OF DISCHARGE:  01/05/2006                                 OPERATIVE REPORT   PROCEDURE:  Large-volume abdominal paracentesis.   INDICATION:  The patient is a 55 year old Caucasian male with cirrhosis with  recent onset of leukocytic ascites with negative cultures who is having  difficulty because of the reaccumulation.  The patient called this morning  stating that he was having discomfort and difficulty breathing when he would  lie flat.  He is therefore undergoing LVAP.  Procedure risks were reviewed  with the patient and informed consent was obtained.   MEDICATIONS:  1% Xylocaine for topical anesthesia.   FINDINGS:  Procedure performed in short stay center.  The patient was placed  in supine position and tilted on the left side.  Skin in left mid abdomen  was prepped in the usual fashion with Betadine solution and isolated using  sterile sheet.  1% Xylocaine was injected into the skin and subcutaneous  tissue down to parietal peritoneum.  A small skin-deep incision was made  following of 14-gauge Caldwell needle which was easily passed into  peritoneal cavity with free flow of hazy yellowish-colored fluid.  A total  of 9 liters of fluid was removed.  The patient's abdomen was very soft and  he felt better.  Puncture site was covered with a Band-Aid and pressure  dressing was applied.  The patient tolerated the procedure well.   FINAL DIAGNOSES:  1.  Recurrent ascites.  2.  Status post large volume abdominal paracentesis with removal of 9 liters      of fluid.   PLAN:  1.  Fluid sent to the lab for cell count, differential, Gram stain, aerobic      and anaerobic cultures in blood culture bottles.   Lab been asked to hold      cultures for two weeks.  2.  Will also check triglycerides levels of this fluid.  3.  He will continue 4 grams sodium diet and Lasix 20 mg q.a.m.  Please note      his spirolactone was discontinued because of hyperkalemia.      Lionel December, M.D.  Electronically Signed     NR/MEDQ  D:  01/05/2006  T:  01/07/2006  Job:  [id number]

## 2010-12-22 NOTE — Consult Note (Signed)
NAME:  [name] [name], [name] [name]               ACCOUNT NO.:  [id number]   MEDICAL RECORD NO.:  [id number]          PATIENT TYPE:  AMB   LOCATION:  RAD                           FACILITY:  [facility]   PHYSICIAN:  Lionel December, M.D.    DATE OF BIRTH:  04-Sep-1955   DATE OF CONSULTATION:  04/24/2005  DATE OF DISCHARGE:                                   CONSULTATION   REASON FOR CONSULTATION:  Hepatitis C with cirrhosis.   HISTORY OF PRESENT ILLNESS:  [name] [name] is a 55 year old Caucasian male who is  referred through courtesy of Dr. Ouida Sills for evaluation of recently diagnosed  hepatitis C with suspected cirrhosis.   [name] [name] recently experienced blurred vision.  He checked his glucose level with  his stat Glucometer and noticed glucose was elevated.  This was a couple of  months ago.  He states he went on a strict diet and managed to loose 30  pounds in a period of three months.  He has noticed his fasting glucose runs  around 100 and at bedtime it is between 120-140.  Nevertheless, he decided  to see Dr. Ouida Sills. On exam, Dr. Ouida Sills noted he had hepatomegaly.  His liver  was firm and nontender.  He, therefore, had further studies.  His LFT's are  noted to be mildly elevated.  He had abdominal/pelvic CT on April 06, 2005, revealing lobulated liver contours with a large lateral segment of the  left lobe as well as large caudad lobe.  Spleen was mildly enlarged  measuring 15 cm.  He has a small amount of preperitoneal fluid as well as  cholelithiasis and two tiny stones in the left kidney.  The patient was felt  to have cirrhosis.  There was also an incidental finding of left inguinal  hernia containing fats.  His hepatitis C antibody came back positive, but  hepatitis B surface antigen is negative.   [name] [name] denies history of jaundice or hepatitis.  There is no history of  tattoos, blood transfusion or IV drug abuse.   Family history is negative for hepatitis C.  He states he feels well.  He  denies weakness,  fatigue, abdominal pain, distention or problems with fluid  retention.  He has a good appetite.  He has occasional dysphagia, usually  with first bite or two, but he denies heartburn, melena or rectal bleeding.  He states he used to drink alcohol socially, but not every day.  Has not had  any in the last six months.   MEDICATIONS:  He is presently on no medications.   PAST MEDICAL HISTORY:  Recent diagnosis of diabetes mellitus, presently on  diet.  Has follow up appointment with Dr. Ouida Sills in 10 weeks.  Recently noted  to have borderline blood pressure.   ALLERGIES:  NK.   FAMILY HISTORY:  Mother is in good health.  Father had coronary artery  disease and was diabetic and died at age 41.  He has two sisters in good  health.   SOCIAL HISTORY:  He has never married and does not have any children.  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  He has been smoking  cigarettes off and on for 15 years, presently two packs per day.  History of  social but not steadily alcohol intake until six months ago.   PHYSICAL EXAMINATION:  GENERAL APPEARANCE:  Pleasant, well-developed, mildly  obese, Caucasian male who is in no acute distress.  VITAL SIGNS:  Weight 215, 6 feet tall, pulse 72, blood pressure 164/88,  temperature 98.3.  HEENT:  Conjunctivae is pink.  Sclerae is nonicteric.  Oropharyngeal mucosa  is normal.  No neck masses or thyromegaly noted.  He has multiple spider  angiomata over his anterior chest.  Most of which are located over the right  pectoral area.  He does not have gynecomastia.  CARDIAC:  Regular rhythm.  Normal S1, S3.  No murmurs, rubs or gallops  noted.  LUNGS:  Clear to auscultation.  ABDOMEN:  Full.  There is prominence to left lobe of the liver just seen on  inspection.  On palpation, abdomen is soft.  Spleen tip is easily palpable  at end inspiration.  He has enlarged liver, but prominent left lobe liver is  firm.  There is 6 cm below RCM at end  inspiration.  Span is percussed to be  17 cm.  No hernia noted in supine position.  Genitalia within normal limits.  He does not have peripheral edema or clubbing.   LABORATORY DATA:  Labs through Dr. Alonza Smoker office from March 30, 2005, WBC  7.0, H&H 15.5 and 44.1, platelets 112,000.  Total bilirubin T 120, AST 51,  ALT 48, total protein 7.8 with albumin 3.4.  Sodium 140, potassium 4.6,  chloride 105, CO2 25, glucose 111, BUN 16, creatinine 1.0.  Hemoglobin A1C  was 8.4, calcium 9.0, cholesterol 135, HDL 41, LDL 77, hepatitis B surface  antigen negative.  Hepatitis C virus antibodies reactive.  CT findings as  above.   IMPRESSION:  [name] [name] is a 55 year old Caucasian male who was recently found to  have hepatomegaly by Dr. Ouida Sills, and on workup, was noted to have hepatitis  C.  His exam, as well as, CT findings, suggests that he has cirrhosis.  He  also has palpable spleen.  His albumin is mildly decreased.  INR is unknown.  He, surprisingly, is free of any symptoms.  I would suspect that he has  stage III or IV disease.  While he is asymptomatic, we may have a window of  opportunity to treat him.  Extensive workup and treatment options reviewed  with patient.  Once his evaluation has been completed, therapy will be  discussed in detail with him.  He has acquainted himself with hepatitis C  disease by looking at information on the Internet with the help of his  sister.   RECOMMENDATIONS:  1.  The patient advised to [facility] to receive full      course of hepatitis B vaccine.  2.  Will go to the lab for INR alpha fetoprotein, HCVR, ANA by PCR      quantitative as well as genotype.  3.  Patient also advised not to eat raw fish.  4.  As soon as these lab studies are available for review, will proceed with      esophagogastroduodenoscopy looking for varices and a liver biopsy prior      to any sharing antiviral therapy. 5.  We would like to thank Dr. Ouida Sills for the  opportunity to participate in  the care of this gentleman.      Lionel December, M.D.  Electronically Signed     NR/MEDQ  D:  04/24/2005  T:  04/25/2005  Job:  [id number]   cc:   Kingsley Callander. Ouida Sills, MD  Fax: [phone]

## 2010-12-22 NOTE — Op Note (Signed)
NAME:  [name] [name], [name] [name]               ACCOUNT NO.:  [id number]   MEDICAL RECORD NO.:  [id number]          PATIENT TYPE:  AMB   LOCATION:  DAY                           FACILITY:  [facility]   PHYSICIAN:  Lionel December, M.D.    DATE OF BIRTH:  [birth year]   DATE OF PROCEDURE:  04/12/2006  DATE OF DISCHARGE:                                 OPERATIVE REPORT   PROCEDURE:  Large-volume abdominal paracentesis.   INDICATIONS:  [name] [name] is a 55 year old Caucasian male with refractory cirrhotic  ascites who is scheduled to undergo TIPS at [facility] next week.  He  is requiring LVAP every 10-14 days.  He felt he could wait another couple  days, but since he is undergoing TIPS next week his physicians at [facility] recommended large-volume abdominal paracentesis be performed today, and  he probably would have another one prior to the procedure.  Procedures and  risks were reviewed with the patient, and informed consent was obtained.   FINDINGS:  Procedure performed in day hospital.  The patient was placed in  the supine position and tilted on the left by placing pillow under the right  flank.  Skin in the left midabdomen was prepped in the usual fashion with  Betadine solution and isolated using a sterile sheet.  1% Xylocaine was  injected in the skin and subcutaneous tissue down to parietal peritoneum  with a 25-gauge needle.  Small skin-deep incision was made with scalpel  blade, and a 14-gauge Caldwell needle was introduced across the abdominal  wall into the peritoneal cavity, with free flow of hazy pale yellow fluid  just like before.  Sample was saved for routine studies.  A total of 10  liters of fluid was removed without any difficulty.  During the procedure,  the patient was given 50 g of IV albumin.  The patient's abdomen was much  softer, and he felt better.  Puncture site was covered with Band-Aid, and  pressure dressing was applied.  The patient tolerated the procedure well.   FINAL  DIAGNOSIS:  Cirrhotic ascites, which is refractory to therapy.  Status  post large-volume abdominal paracentesis, with removal of 10 liters of  fluid.   The patient was given 50 grams of albumin IV during the procedure.   PLAN:  Fluid sent to lab for Gram's stain, cell count, differential,  aerobic, anaerobic cultures, and blood culture bottles.   He can remove the pressure dressing in the a.m. but leave the Band-Aid on.  He will keep his appointment for TIPS at [facility] next week.      Lionel December, M.D.  Electronically Signed     NR/MEDQ  D:  04/12/2006  T:  04/13/2006  Job:  [id number]   cc:   Kingsley Callander. Ouida Sills, MD  Fax: [phone]   Satira Mccallum,, PA  GI Division  [facility]

## 2010-12-22 NOTE — Op Note (Signed)
NAME:  [name] [name], [name] [name]               ACCOUNT NO.:  [id number]   MEDICAL RECORD NO.:  [id number]          PATIENT TYPE:  AMB   LOCATION:  DAY                           FACILITY:  [facility]   PHYSICIAN:  Lionel December, M.D.    DATE OF BIRTH:  05/27/1956   DATE OF PROCEDURE:  12/07/2005  DATE OF DISCHARGE:                                 OPERATIVE REPORT   PROCEDURE:  Follow-up abdominal paracentesis for cell count.   [name] is a 55 year old Caucasian male with cirrhosis predominately secondary  to prior alcohol intake who also tested positive for HCV antibody but HCV  RNA by PCR was negative.  His LFTs were normal except albumin of 3.3.  He  presented with acute onset of ascites without any pain or fever.  He had  LVAP 2 days ago with removal of over 7 liters of fluid.  It was hazy, cell  count revealed WBCs 900 and segs were 43%.  His cultures are negative.  He  is being treated with Rocephin.  He is undergoing repeat tap to document  that he is responding to therapy.  In essence, he has culture negative  neutrocytic ascites.  The procedure was reviewed with the patient and  informed consent was obtained.   FINDINGS:  The procedure performed in the hospital.  The patient was placed  in supine position and tilted on the left side.  The skin in the left mid  abdomen was prepped in the usual fashion with Betadine solution and isolated  using a sterile sheet.  Then 1%Xylocaine was injected in the skin and  subcutaneous tissue.  Deeper injection was made with a 22 gauge needle.  A  small skin deep incision was made to facilitate entry of 14-gauge Caldwell  needle which was easily introduced into peritoneal cavity with free flow of  fluid.  It still was hazy.  A sample was sent to the lab for routine  studies.  A total of 4 liters of fluid was removed without any difficulty.  The patient's abdomen was much softer although he still had some fluid in  his abdomen.  The puncture site was covered with a  bandage and a pressure  dressing was applied.  The patient tolerated the procedure well.   FINAL DIAGNOSIS:  Status post abdominal paracentesis primarily for  diagnostic purposes, but 4 liters of fluid was removed.   PLAN:  Fluid sent to the lab for cell count, Dif, Gram's stain, aerobic,  anaerobic cultures and blood culture bottles.  We had asked the lab to hold  the cultures for at least 7 days.   He will finish up his Rocephin in 2 days and then will start Cipro 750 mg  p.o. b.i.d. for 1 week.  He has office visit on Dec 12, 2005.      Lionel December, M.D.  Electronically Signed     NR/MEDQ  D:  12/07/2005  T:  12/08/2005  Job:  [id number]   cc:   Kingsley Callander. Ouida Sills, MD  Fax: [phone]

## 2011-03-16 ENCOUNTER — Other Ambulatory Visit (INDEPENDENT_AMBULATORY_CARE_PROVIDER_SITE_OTHER): Payer: Self-pay | Admitting: *Deleted

## 2011-03-16 NOTE — Telephone Encounter (Signed)
Rec'd fax request from Pharmacy for medication refill

## 2011-03-19 MED ORDER — FUROSEMIDE 40 MG PO TABS: ORAL_TABLET | ORAL | Status: DC

## 2011-06-19 ENCOUNTER — Ambulatory Visit (INDEPENDENT_AMBULATORY_CARE_PROVIDER_SITE_OTHER): Payer: Medicaid Other | Admitting: Ophthalmology

## 2011-06-19 DIAGNOSIS — E11359 Type 2 diabetes mellitus with proliferative diabetic retinopathy without macular edema: Secondary | ICD-10-CM

## 2011-06-19 DIAGNOSIS — E1139 Type 2 diabetes mellitus with other diabetic ophthalmic complication: Secondary | ICD-10-CM

## 2011-06-19 DIAGNOSIS — H2510 Age-related nuclear cataract, unspecified eye: Secondary | ICD-10-CM

## 2011-06-19 DIAGNOSIS — H43819 Vitreous degeneration, unspecified eye: Secondary | ICD-10-CM

## 2011-10-09 ENCOUNTER — Encounter (INDEPENDENT_AMBULATORY_CARE_PROVIDER_SITE_OTHER): Payer: Self-pay | Admitting: *Deleted

## 2011-10-17 ENCOUNTER — Ambulatory Visit (INDEPENDENT_AMBULATORY_CARE_PROVIDER_SITE_OTHER): Payer: No Typology Code available for payment source | Admitting: Ophthalmology

## 2011-10-17 DIAGNOSIS — H43819 Vitreous degeneration, unspecified eye: Secondary | ICD-10-CM

## 2011-10-17 DIAGNOSIS — E1165 Type 2 diabetes mellitus with hyperglycemia: Secondary | ICD-10-CM

## 2011-10-17 DIAGNOSIS — H2510 Age-related nuclear cataract, unspecified eye: Secondary | ICD-10-CM

## 2011-10-17 DIAGNOSIS — E11359 Type 2 diabetes mellitus with proliferative diabetic retinopathy without macular edema: Secondary | ICD-10-CM

## 2011-10-23 ENCOUNTER — Encounter (INDEPENDENT_AMBULATORY_CARE_PROVIDER_SITE_OTHER): Payer: Self-pay | Admitting: Internal Medicine

## 2011-10-23 ENCOUNTER — Ambulatory Visit (INDEPENDENT_AMBULATORY_CARE_PROVIDER_SITE_OTHER): Payer: No Typology Code available for payment source | Admitting: Internal Medicine

## 2011-10-23 VITALS — BP 140/80 | HR 78 | Temp 97.6°F | Resp 16 | Ht 72.0 in | Wt 240.0 lb

## 2011-10-23 DIAGNOSIS — K7460 Unspecified cirrhosis of liver: Secondary | ICD-10-CM

## 2011-10-23 DIAGNOSIS — Z9889 Other specified postprocedural states: Secondary | ICD-10-CM

## 2011-10-23 DIAGNOSIS — E119 Type 2 diabetes mellitus without complications: Secondary | ICD-10-CM | POA: Insufficient documentation

## 2011-10-23 DIAGNOSIS — K219 Gastro-esophageal reflux disease without esophagitis: Secondary | ICD-10-CM | POA: Insufficient documentation

## 2011-10-23 DIAGNOSIS — K7469 Other cirrhosis of liver: Secondary | ICD-10-CM

## 2011-10-23 DIAGNOSIS — Z95828 Presence of other vascular implants and grafts: Secondary | ICD-10-CM | POA: Insufficient documentation

## 2011-10-23 LAB — AFP TUMOR MARKER: AFP-Tumor Marker: 2 ng/mL (ref 0.0–8.0)

## 2011-10-23 NOTE — Patient Instructions (Signed)
Physician Will contact with results of blood work and ultrasound and completed.

## 2011-10-23 NOTE — Progress Notes (Signed)
Presenting complaint;  Followup for cryptogenic cirrhosis.  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 He was last seen in [place] in may 2010. He states he feels fine. He has Very good appetite. Because of his diabetes and being prompted by Dr. Ouida Sills he has lost 15 pounds in the last one year. He denies insomnia or confusion. He has very good appetite and his bowels move regularly. He denies abdominal pain weakness or fatigue. He walks 4-5 miles every day. Patient has not had ultrasound in 2 years. He had EGD and colonoscopy in April 2009. EGD was normal and colonoscopy revealed external hemorrhoids.  Current Medications: Current Outpatient Prescriptions  Medication Sig Dispense Refill  . ciprofloxacin (CIPRO) 750 MG tablet Take 750 mg by mouth once a week.      Marland Kitchen glimepiride (AMARYL) 4 MG tablet Take 4 mg by mouth daily before breakfast.      . OMEPRAZOLE PO Take 20 mg by mouth daily.      . furosemide (LASIX) 40 MG tablet Take 1 Tablet By mouth Everyday Or Every Other Day  30 tablet  5     Objective: Blood pressure 140/80, pulse 78, temperature 97.6 F (36.4 C), temperature source Oral, resp. rate 16, height 6' (1.829 m), weight 240 lb (108.863 kg). Patient is alert and does not have asterixis the Conjunctiva is pink. Sclera is nonicteric Oropharyngeal mucosa is normal. No neck masses or thyromegaly noted. Cardiac exam with regular rhythm normal S1 and S2. No murmur or gallop noted. Lungs are clear to auscultation. Abdomen is obese but soft and nontender without organomegaly or masses. Shifting dullness is absent the No LE edema or clubbing noted.  Labs/studies Results: From 10/05/2011. WBC 6.8, hemoglobin 14.8, hematocrit 44.4, platelet count 126K. Electrolytes normal. Glucose 124, BUN 16, creatinine 1.40, bilirubin 1.3, AP 103, AST  37, ALT 31, total protein 7.1 with albumin of 3.7 calcium 9.1.  Hemoglobin A1c 5.8.   Assessment:  Cryptogenic cirrhosis. Status post TIPS placement in September 2008 for refractory ascites is nothing in transient problems with HE is at hepatic function is surprisingly improved. Clinically he has no ascites. Mild thrombocytopenia secondary to chronic liver disease.   Plan:  Check serum Alpha-fetoprotein. Hepatobiliary ultrasound along with Doppler study to assess flow across Tips. Patient patient advised to keep working at losing weight should at least lose another 20 pounds. Presuming ultrasound is normal it will be repeated in 6 months and a return for office visit in one year.

## 2011-10-25 ENCOUNTER — Ambulatory Visit (HOSPITAL_COMMUNITY)
Admission: RE | Admit: 2011-10-25 | Discharge: 2011-10-25 | Disposition: A | Discharge status: Home / Self Care | Payer: No Typology Code available for payment source | Source: Ambulatory Visit | Attending: Internal Medicine | Admitting: Internal Medicine

## 2011-10-25 ENCOUNTER — Other Ambulatory Visit (INDEPENDENT_AMBULATORY_CARE_PROVIDER_SITE_OTHER): Payer: Self-pay | Admitting: Internal Medicine

## 2011-10-25 DIAGNOSIS — K7469 Other cirrhosis of liver: Secondary | ICD-10-CM

## 2011-10-25 DIAGNOSIS — K8020 Calculus of gallbladder without cholecystitis without obstruction: Secondary | ICD-10-CM | POA: Insufficient documentation

## 2011-10-25 DIAGNOSIS — K7460 Unspecified cirrhosis of liver: Secondary | ICD-10-CM | POA: Insufficient documentation

## 2011-10-25 DIAGNOSIS — Z9889 Other specified postprocedural states: Secondary | ICD-10-CM | POA: Insufficient documentation

## 2012-02-01 ENCOUNTER — Encounter (INDEPENDENT_AMBULATORY_CARE_PROVIDER_SITE_OTHER): Payer: Self-pay

## 2012-02-15 ENCOUNTER — Encounter (INDEPENDENT_AMBULATORY_CARE_PROVIDER_SITE_OTHER): Payer: No Typology Code available for payment source | Admitting: Ophthalmology

## 2012-02-15 DIAGNOSIS — H43819 Vitreous degeneration, unspecified eye: Secondary | ICD-10-CM

## 2012-02-15 DIAGNOSIS — E1139 Type 2 diabetes mellitus with other diabetic ophthalmic complication: Secondary | ICD-10-CM

## 2012-02-15 DIAGNOSIS — H2510 Age-related nuclear cataract, unspecified eye: Secondary | ICD-10-CM

## 2012-02-15 DIAGNOSIS — E1165 Type 2 diabetes mellitus with hyperglycemia: Secondary | ICD-10-CM

## 2012-02-15 DIAGNOSIS — E11359 Type 2 diabetes mellitus with proliferative diabetic retinopathy without macular edema: Secondary | ICD-10-CM

## 2012-04-11 ENCOUNTER — Encounter (INDEPENDENT_AMBULATORY_CARE_PROVIDER_SITE_OTHER): Payer: Self-pay | Admitting: *Deleted

## 2012-04-18 ENCOUNTER — Ambulatory Visit (INDEPENDENT_AMBULATORY_CARE_PROVIDER_SITE_OTHER): Payer: No Typology Code available for payment source | Admitting: Ophthalmology

## 2012-04-21 DIAGNOSIS — E119 Type 2 diabetes mellitus without complications: Secondary | ICD-10-CM | POA: Insufficient documentation

## 2012-05-14 ENCOUNTER — Encounter (INDEPENDENT_AMBULATORY_CARE_PROVIDER_SITE_OTHER): Payer: Self-pay

## 2012-06-10 ENCOUNTER — Other Ambulatory Visit (INDEPENDENT_AMBULATORY_CARE_PROVIDER_SITE_OTHER): Payer: Self-pay | Admitting: Internal Medicine

## 2012-07-09 ENCOUNTER — Other Ambulatory Visit (INDEPENDENT_AMBULATORY_CARE_PROVIDER_SITE_OTHER): Payer: Self-pay | Admitting: Internal Medicine

## 2012-07-25 ENCOUNTER — Encounter (INDEPENDENT_AMBULATORY_CARE_PROVIDER_SITE_OTHER): Payer: Self-pay

## 2012-08-18 ENCOUNTER — Ambulatory Visit (INDEPENDENT_AMBULATORY_CARE_PROVIDER_SITE_OTHER): Payer: Medicaid Other | Admitting: Ophthalmology

## 2012-08-18 DIAGNOSIS — H43819 Vitreous degeneration, unspecified eye: Secondary | ICD-10-CM

## 2012-08-18 DIAGNOSIS — I10 Essential (primary) hypertension: Secondary | ICD-10-CM

## 2012-08-18 DIAGNOSIS — H2510 Age-related nuclear cataract, unspecified eye: Secondary | ICD-10-CM

## 2012-08-18 DIAGNOSIS — E1165 Type 2 diabetes mellitus with hyperglycemia: Secondary | ICD-10-CM

## 2012-08-18 DIAGNOSIS — H35039 Hypertensive retinopathy, unspecified eye: Secondary | ICD-10-CM

## 2012-08-18 DIAGNOSIS — E11359 Type 2 diabetes mellitus with proliferative diabetic retinopathy without macular edema: Secondary | ICD-10-CM

## 2012-10-04 ENCOUNTER — Other Ambulatory Visit (INDEPENDENT_AMBULATORY_CARE_PROVIDER_SITE_OTHER): Payer: Self-pay | Admitting: Internal Medicine

## 2012-11-20 ENCOUNTER — Encounter (INDEPENDENT_AMBULATORY_CARE_PROVIDER_SITE_OTHER): Payer: Self-pay

## 2013-01-28 ENCOUNTER — Other Ambulatory Visit (HOSPITAL_COMMUNITY): Payer: Self-pay | Admitting: Internal Medicine

## 2013-01-28 DIAGNOSIS — K7460 Unspecified cirrhosis of liver: Secondary | ICD-10-CM

## 2013-01-28 DIAGNOSIS — R0989 Other specified symptoms and signs involving the circulatory and respiratory systems: Secondary | ICD-10-CM

## 2013-01-28 DIAGNOSIS — I10 Essential (primary) hypertension: Secondary | ICD-10-CM | POA: Diagnosis present

## 2013-01-30 ENCOUNTER — Ambulatory Visit (HOSPITAL_COMMUNITY)
Admission: RE | Admit: 2013-01-30 | Discharge: 2013-01-30 | Disposition: A | Discharge status: Home / Self Care | Payer: Medicaid Other | Source: Ambulatory Visit | Attending: Internal Medicine | Admitting: Internal Medicine

## 2013-01-30 DIAGNOSIS — I10 Essential (primary) hypertension: Secondary | ICD-10-CM | POA: Insufficient documentation

## 2013-01-30 DIAGNOSIS — K7460 Unspecified cirrhosis of liver: Secondary | ICD-10-CM

## 2013-01-30 DIAGNOSIS — R0989 Other specified symptoms and signs involving the circulatory and respiratory systems: Secondary | ICD-10-CM | POA: Insufficient documentation

## 2013-01-30 DIAGNOSIS — F17200 Nicotine dependence, unspecified, uncomplicated: Secondary | ICD-10-CM | POA: Insufficient documentation

## 2013-01-30 DIAGNOSIS — E119 Type 2 diabetes mellitus without complications: Secondary | ICD-10-CM | POA: Insufficient documentation

## 2013-01-30 DIAGNOSIS — I6529 Occlusion and stenosis of unspecified carotid artery: Secondary | ICD-10-CM | POA: Insufficient documentation

## 2013-03-23 ENCOUNTER — Encounter (INDEPENDENT_AMBULATORY_CARE_PROVIDER_SITE_OTHER): Payer: Self-pay

## 2013-05-05 ENCOUNTER — Telehealth (INDEPENDENT_AMBULATORY_CARE_PROVIDER_SITE_OTHER): Payer: Self-pay | Admitting: Internal Medicine

## 2013-05-05 MED ORDER — FUROSEMIDE 40 MG PO TABS: ORAL_TABLET | ORAL | Status: DC

## 2013-05-05 NOTE — Telephone Encounter (Signed)
Lasix reordered

## 2013-05-18 ENCOUNTER — Ambulatory Visit (INDEPENDENT_AMBULATORY_CARE_PROVIDER_SITE_OTHER): Payer: Medicaid Other | Admitting: Ophthalmology

## 2013-05-18 DIAGNOSIS — I10 Essential (primary) hypertension: Secondary | ICD-10-CM

## 2013-05-18 DIAGNOSIS — H35039 Hypertensive retinopathy, unspecified eye: Secondary | ICD-10-CM

## 2013-05-18 DIAGNOSIS — E1139 Type 2 diabetes mellitus with other diabetic ophthalmic complication: Secondary | ICD-10-CM

## 2013-05-18 DIAGNOSIS — H2510 Age-related nuclear cataract, unspecified eye: Secondary | ICD-10-CM

## 2013-05-18 DIAGNOSIS — E11359 Type 2 diabetes mellitus with proliferative diabetic retinopathy without macular edema: Secondary | ICD-10-CM

## 2013-05-18 DIAGNOSIS — H43819 Vitreous degeneration, unspecified eye: Secondary | ICD-10-CM

## 2013-07-01 ENCOUNTER — Other Ambulatory Visit (INDEPENDENT_AMBULATORY_CARE_PROVIDER_SITE_OTHER): Payer: Self-pay | Admitting: Internal Medicine

## 2013-07-16 ENCOUNTER — Encounter (INDEPENDENT_AMBULATORY_CARE_PROVIDER_SITE_OTHER): Payer: Self-pay

## 2013-11-04 ENCOUNTER — Other Ambulatory Visit (INDEPENDENT_AMBULATORY_CARE_PROVIDER_SITE_OTHER): Payer: Self-pay | Admitting: Internal Medicine

## 2013-11-04 DIAGNOSIS — K7460 Unspecified cirrhosis of liver: Secondary | ICD-10-CM

## 2013-11-04 NOTE — Telephone Encounter (Signed)
Patient needs OV.

## 2014-01-04 ENCOUNTER — Other Ambulatory Visit (INDEPENDENT_AMBULATORY_CARE_PROVIDER_SITE_OTHER): Payer: Self-pay | Admitting: Internal Medicine

## 2014-01-06 ENCOUNTER — Other Ambulatory Visit (INDEPENDENT_AMBULATORY_CARE_PROVIDER_SITE_OTHER): Payer: Self-pay | Admitting: Internal Medicine

## 2014-01-08 NOTE — Telephone Encounter (Signed)
Per Dr.Rehman may fill with 2 additional refills. 

## 2014-02-15 ENCOUNTER — Other Ambulatory Visit (HOSPITAL_COMMUNITY): Payer: Self-pay | Admitting: Internal Medicine

## 2014-02-15 DIAGNOSIS — R0989 Other specified symptoms and signs involving the circulatory and respiratory systems: Secondary | ICD-10-CM

## 2014-02-15 DIAGNOSIS — K7460 Unspecified cirrhosis of liver: Secondary | ICD-10-CM

## 2014-02-18 ENCOUNTER — Other Ambulatory Visit (HOSPITAL_COMMUNITY): Payer: Self-pay | Admitting: Internal Medicine

## 2014-02-18 DIAGNOSIS — R0989 Other specified symptoms and signs involving the circulatory and respiratory systems: Secondary | ICD-10-CM

## 2014-02-18 DIAGNOSIS — K7460 Unspecified cirrhosis of liver: Secondary | ICD-10-CM

## 2014-02-22 ENCOUNTER — Ambulatory Visit (HOSPITAL_COMMUNITY)
Admission: RE | Admit: 2014-02-22 | Discharge: 2014-02-22 | Disposition: A | Discharge status: Home / Self Care | Payer: Medicaid Other | Source: Ambulatory Visit | Attending: Internal Medicine | Admitting: Internal Medicine

## 2014-02-22 ENCOUNTER — Other Ambulatory Visit (HOSPITAL_COMMUNITY): Payer: Self-pay | Admitting: Internal Medicine

## 2014-02-22 DIAGNOSIS — K7460 Unspecified cirrhosis of liver: Secondary | ICD-10-CM | POA: Diagnosis not present

## 2014-02-22 DIAGNOSIS — I6529 Occlusion and stenosis of unspecified carotid artery: Secondary | ICD-10-CM | POA: Insufficient documentation

## 2014-02-22 DIAGNOSIS — R0989 Other specified symptoms and signs involving the circulatory and respiratory systems: Secondary | ICD-10-CM

## 2014-02-22 DIAGNOSIS — I658 Occlusion and stenosis of other precerebral arteries: Secondary | ICD-10-CM | POA: Diagnosis not present

## 2014-03-03 ENCOUNTER — Other Ambulatory Visit (HOSPITAL_COMMUNITY): Payer: Medicaid Other

## 2014-03-26 ENCOUNTER — Encounter (INDEPENDENT_AMBULATORY_CARE_PROVIDER_SITE_OTHER): Payer: Self-pay

## 2014-05-05 ENCOUNTER — Other Ambulatory Visit (INDEPENDENT_AMBULATORY_CARE_PROVIDER_SITE_OTHER): Payer: Self-pay | Admitting: Internal Medicine

## 2014-05-19 ENCOUNTER — Ambulatory Visit (INDEPENDENT_AMBULATORY_CARE_PROVIDER_SITE_OTHER): Payer: Medicaid Other | Admitting: Ophthalmology

## 2014-05-19 DIAGNOSIS — H35033 Hypertensive retinopathy, bilateral: Secondary | ICD-10-CM

## 2014-05-19 DIAGNOSIS — H43813 Vitreous degeneration, bilateral: Secondary | ICD-10-CM

## 2014-05-19 DIAGNOSIS — E11319 Type 2 diabetes mellitus with unspecified diabetic retinopathy without macular edema: Secondary | ICD-10-CM

## 2014-05-19 DIAGNOSIS — I10 Essential (primary) hypertension: Secondary | ICD-10-CM

## 2014-05-19 DIAGNOSIS — E11359 Type 2 diabetes mellitus with proliferative diabetic retinopathy without macular edema: Secondary | ICD-10-CM

## 2014-05-19 DIAGNOSIS — H2513 Age-related nuclear cataract, bilateral: Secondary | ICD-10-CM

## 2014-07-02 ENCOUNTER — Other Ambulatory Visit (INDEPENDENT_AMBULATORY_CARE_PROVIDER_SITE_OTHER): Payer: Self-pay | Admitting: Internal Medicine

## 2014-07-05 ENCOUNTER — Other Ambulatory Visit (INDEPENDENT_AMBULATORY_CARE_PROVIDER_SITE_OTHER): Payer: Self-pay | Admitting: Internal Medicine

## 2014-07-05 NOTE — Telephone Encounter (Signed)
Apt has been scheduled for 07/19/14 with Terri Setzer, NP.  

## 2014-07-05 NOTE — Telephone Encounter (Signed)
Per Dr.Rehman the patient needs a office visit.

## 2014-07-06 ENCOUNTER — Encounter (INDEPENDENT_AMBULATORY_CARE_PROVIDER_SITE_OTHER): Payer: Self-pay | Admitting: *Deleted

## 2014-07-19 ENCOUNTER — Telehealth (INDEPENDENT_AMBULATORY_CARE_PROVIDER_SITE_OTHER): Payer: Self-pay | Admitting: *Deleted

## 2014-07-19 ENCOUNTER — Encounter (INDEPENDENT_AMBULATORY_CARE_PROVIDER_SITE_OTHER): Payer: Self-pay | Admitting: Internal Medicine

## 2014-07-19 ENCOUNTER — Ambulatory Visit (INDEPENDENT_AMBULATORY_CARE_PROVIDER_SITE_OTHER): Payer: Medicaid Other | Admitting: Internal Medicine

## 2014-07-19 VITALS — BP 146/70 | HR 60 | Temp 97.4°F | Ht 70.5 in | Wt 248.7 lb

## 2014-07-19 DIAGNOSIS — K7469 Other cirrhosis of liver: Secondary | ICD-10-CM

## 2014-07-19 DIAGNOSIS — K7460 Unspecified cirrhosis of liver: Secondary | ICD-10-CM

## 2014-07-19 DIAGNOSIS — R188 Other ascites: Principal | ICD-10-CM

## 2014-07-19 NOTE — Telephone Encounter (Signed)
Per Dr.Rehman the patient will need to have labs drawn in 6 months. 

## 2014-07-19 NOTE — Addendum Note (Signed)
Addended by: Len BlalockSETZER, Marcie Shearon L on: 07/19/2014 02:27 PM   Modules accepted: Orders

## 2014-07-19 NOTE — Progress Notes (Addendum)
   Subjective:    Patient ID: [name] [name], male    DOB: [birth year], 58 y.o.   MRN: [id number]  HPI Here today for f/u. 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 He was last seen in 2013 by Dr. Karilyn Cotaehman. He has gained about 8 pounds since his last visit in 2013. He tells me he is is doing good. He is retired from farming. He denies any confusion.  Appetite has remained good. No abdominal pain. His BMs are moving normal usually 1-2 a day. He denies any abdoiminal distention.  No hx of etoh Denies any jaundice    He had EGD and colonoscopy in April 2009. EGD was normal and colonoscopy revealed external hemorrhoids.     US Art/Ven flow abdomen/pelvis doppler limited.  Findings: 10/24/2013  TIPS is patent without significant aliasing. Portal venous flow is towards the TIPS. Hepatic veins are patent with hepatofugal flow. Splenic vein is patent. Hepatic artery is patent.   02/22/2014 US abdomen:  IMPRESSION:  Liver:  Nodular and heterogeneous echotexture without focal lesion or intrahepatic biliary ductal dilatation.  1. Probable tumefactive sludge in the gallbladder without other ultrasound suggestion of cholecystitis.   02/09/2014 H and h 12.7 AND 35.0, MCV 102, Platelet ct 102. Total bili 1.5, ALP 81, AST 29, ALT 19 HA1C 6.1 06/17/2014 H and H 10.1 and 27.1, MCV 111.2    Review of Systems  Single. No children Past Medical History  Diagnosis Date  . Liver disease   . Cryptogenic cirrhosis   . GERD (gastroesophageal reflux disease)   . Diabetes mellitus     Past Surgical History  Procedure Laterality Date  . Colonoscopy    . Upper gastrointestinal endoscopy    . Tips procedure  2008  . Liver biopsy      No Known Allergies  Current Outpatient Prescriptions on File Prior to Visit  Medication Sig Dispense Refill  . ciprofloxacin (CIPRO) 750 MG tablet TAKE ONE TABLET BY MOUTH EVERY WEEK. 12 tablet 3    . furosemide (LASIX) 40 MG tablet TAKE 1 TABLET BY MOUTH EVERYDAY OR EVERY OTHER DAY. 30 tablet 2  . glimepiride (AMARYL) 4 MG tablet Take 4 mg by mouth daily before breakfast.    . omeprazole (PRILOSEC) 20 MG capsule TAKE 1 CAPSULE BY MOUTH EVERY MORNING FOR ACID REFLUX. 30 capsule 5   No current facility-administered medications on file prior to visit.        Objective:   Physical Exam  Filed Vitals:   07/19/14 1007  Height: 5' 10.5" (1.791 m)  Weight: 248 lb 11.2 oz (112.81 kg)   Alert and oriented. Skin warm and dry. Oral mucosa is moist.   . Sclera anicteric, conjunctivae is pink. Thyroid not enlarged. No cervical lymphadenopathy. Lungs clear. Heart regular rate and rhythm.  Abdomen is soft. Bowel sounds are positive. No hepatomegaly. No abdominal masses felt. No tenderness.  Belly obese. No edema to lower extremities.         Assessment & Plan:  Cryptogenic cirrhosis. He seems to be doing well.  Patient advised he needs to lose weight.   CBC, Hepatic function, AFP today OV in 6 months with a CBC, Hepatic function and AFP.  US abdomen next month.

## 2014-07-19 NOTE — Patient Instructions (Signed)
OV in 6 months with a CBC, Hepatic function and AFP. Will also need an US abdomen.

## 2014-07-20 LAB — CBC WITH DIFFERENTIAL/PLATELET
BASOS PCT: 1 % (ref 0–1)
Basophils Absolute: 0.1 10*3/uL (ref 0.0–0.1)
EOS PCT: 6 % — AB (ref 0–5)
Eosinophils Absolute: 0.3 10*3/uL (ref 0.0–0.7)
HEMATOCRIT: 32.4 % — AB (ref 39.0–52.0)
Hemoglobin: 11.4 g/dL — ABNORMAL LOW (ref 13.0–17.0)
Lymphocytes Relative: 33 % (ref 12–46)
Lymphs Abs: 1.7 10*3/uL (ref 0.7–4.0)
MCH: 41.5 pg — AB (ref 26.0–34.0)
MCHC: 35.2 g/dL (ref 30.0–36.0)
MCV: 117.8 fL — AB (ref 78.0–100.0)
MONO ABS: 0.4 10*3/uL (ref 0.1–1.0)
MONOS PCT: 8 % (ref 3–12)
MPV: 9.5 fL (ref 9.4–12.4)
NEUTROS ABS: 2.7 10*3/uL (ref 1.7–7.7)
Neutrophils Relative %: 52 % (ref 43–77)
Platelets: 113 10*3/uL — ABNORMAL LOW (ref 150–400)
RBC: 2.75 MIL/uL — ABNORMAL LOW (ref 4.22–5.81)
RDW: 14.2 % (ref 11.5–15.5)
WBC: 5.2 10*3/uL (ref 4.0–10.5)

## 2014-07-20 LAB — HEPATIC FUNCTION PANEL
ALT: 21 U/L (ref 0–53)
AST: 30 U/L (ref 0–37)
Albumin: 3.9 g/dL (ref 3.5–5.2)
Alkaline Phosphatase: 83 U/L (ref 39–117)
BILIRUBIN DIRECT: 0.4 mg/dL — AB (ref 0.0–0.3)
Indirect Bilirubin: 1.1 mg/dL (ref 0.2–1.2)
Total Bilirubin: 1.5 mg/dL — ABNORMAL HIGH (ref 0.2–1.2)
Total Protein: 6.9 g/dL (ref 6.0–8.3)

## 2014-07-20 LAB — AFP TUMOR MARKER: AFP-Tumor Marker: 1.7 ng/mL (ref <6.1)

## 2014-07-21 ENCOUNTER — Telehealth (INDEPENDENT_AMBULATORY_CARE_PROVIDER_SITE_OTHER): Payer: Self-pay | Admitting: *Deleted

## 2014-07-21 DIAGNOSIS — R188 Other ascites: Principal | ICD-10-CM

## 2014-07-21 DIAGNOSIS — K7460 Unspecified cirrhosis of liver: Secondary | ICD-10-CM

## 2014-07-21 NOTE — Telephone Encounter (Signed)
.  Per Terri Setzer,NP patient to have lab work in 4 weeks. 

## 2014-07-27 ENCOUNTER — Encounter (INDEPENDENT_AMBULATORY_CARE_PROVIDER_SITE_OTHER): Payer: Self-pay | Admitting: *Deleted

## 2014-07-27 ENCOUNTER — Encounter (INDEPENDENT_AMBULATORY_CARE_PROVIDER_SITE_OTHER): Payer: Self-pay

## 2014-08-03 ENCOUNTER — Other Ambulatory Visit (INDEPENDENT_AMBULATORY_CARE_PROVIDER_SITE_OTHER): Payer: Self-pay | Admitting: *Deleted

## 2014-08-03 ENCOUNTER — Encounter (INDEPENDENT_AMBULATORY_CARE_PROVIDER_SITE_OTHER): Payer: Self-pay | Admitting: *Deleted

## 2014-08-03 DIAGNOSIS — K7460 Unspecified cirrhosis of liver: Secondary | ICD-10-CM

## 2014-08-03 DIAGNOSIS — R188 Other ascites: Principal | ICD-10-CM

## 2014-08-09 ENCOUNTER — Ambulatory Visit (HOSPITAL_COMMUNITY)
Admission: RE | Admit: 2014-08-09 | Discharge: 2014-08-09 | Disposition: A | Discharge status: Home / Self Care | Payer: Medicaid Other | Source: Ambulatory Visit | Attending: Internal Medicine | Admitting: Internal Medicine

## 2014-08-09 DIAGNOSIS — R188 Other ascites: Secondary | ICD-10-CM

## 2014-08-09 DIAGNOSIS — K7460 Unspecified cirrhosis of liver: Secondary | ICD-10-CM

## 2014-08-09 DIAGNOSIS — R161 Splenomegaly, not elsewhere classified: Secondary | ICD-10-CM | POA: Insufficient documentation

## 2014-08-09 DIAGNOSIS — K8020 Calculus of gallbladder without cholecystitis without obstruction: Secondary | ICD-10-CM | POA: Insufficient documentation

## 2014-09-02 ENCOUNTER — Other Ambulatory Visit (INDEPENDENT_AMBULATORY_CARE_PROVIDER_SITE_OTHER): Payer: Self-pay | Admitting: Internal Medicine

## 2014-10-02 ENCOUNTER — Other Ambulatory Visit (INDEPENDENT_AMBULATORY_CARE_PROVIDER_SITE_OTHER): Payer: Self-pay | Admitting: Internal Medicine

## 2014-10-15 ENCOUNTER — Encounter (HOSPITAL_COMMUNITY)
Admission: RE | Admit: 2014-10-15 | Discharge: 2014-10-15 | Disposition: A | Discharge status: Home / Self Care | Payer: Medicaid Other | Source: Ambulatory Visit | Attending: Internal Medicine | Admitting: Internal Medicine

## 2014-10-15 DIAGNOSIS — K7689 Other specified diseases of liver: Secondary | ICD-10-CM | POA: Insufficient documentation

## 2014-10-15 DIAGNOSIS — D638 Anemia in other chronic diseases classified elsewhere: Secondary | ICD-10-CM | POA: Insufficient documentation

## 2014-10-15 LAB — RETICULOCYTES
RBC.: 1.72 MIL/uL — AB (ref 4.22–5.81)
RETIC COUNT ABSOLUTE: 29.2 10*3/uL (ref 19.0–186.0)
Retic Ct Pct: 1.7 % (ref 0.4–3.1)

## 2014-10-15 LAB — IRON AND TIBC
IRON: 281 ug/dL — AB (ref 42–165)
UIBC: <15 ug/dL — ABNORMAL LOW (ref 125–400)

## 2014-10-15 LAB — ABO/RH: ABO/RH(D): O POS

## 2014-10-15 LAB — HEMOGLOBIN AND HEMATOCRIT, BLOOD
HEMATOCRIT: 19.6 % — AB (ref 39.0–52.0)
Hemoglobin: 7.3 g/dL — ABNORMAL LOW (ref 13.0–17.0)

## 2014-10-15 LAB — PREPARE RBC (CROSSMATCH)

## 2014-10-15 NOTE — Progress Notes (Signed)
Here for labs and type and cross. Dx anemia ,chronic liver disease.

## 2014-10-15 NOTE — Progress Notes (Signed)
Consent signed. Labs drawn and sent to lab for results.

## 2014-10-16 DIAGNOSIS — D638 Anemia in other chronic diseases classified elsewhere: Secondary | ICD-10-CM | POA: Diagnosis not present

## 2014-10-16 LAB — VITAMIN B12: VITAMIN B 12: 114 pg/mL — AB (ref 211–911)

## 2014-10-16 LAB — FERRITIN: Ferritin: 545 ng/mL — ABNORMAL HIGH (ref 22–322)

## 2014-10-16 LAB — FOLATE: Folate: 10.5 ng/mL

## 2014-10-17 LAB — TYPE AND SCREEN
ABO/RH(D): O POS
ANTIBODY SCREEN: NEGATIVE
UNIT DIVISION: 0
UNIT DIVISION: 0

## 2014-11-11 ENCOUNTER — Ambulatory Visit (INDEPENDENT_AMBULATORY_CARE_PROVIDER_SITE_OTHER): Payer: Medicaid Other | Admitting: Internal Medicine

## 2014-11-11 ENCOUNTER — Encounter (INDEPENDENT_AMBULATORY_CARE_PROVIDER_SITE_OTHER): Payer: Self-pay | Admitting: Internal Medicine

## 2014-11-11 VITALS — BP 142/70 | HR 66 | Temp 98.5°F | Resp 18 | Ht 70.5 in | Wt 248.3 lb

## 2014-11-11 DIAGNOSIS — D519 Vitamin B12 deficiency anemia, unspecified: Secondary | ICD-10-CM

## 2014-11-11 DIAGNOSIS — R188 Other ascites: Principal | ICD-10-CM

## 2014-11-11 DIAGNOSIS — K7460 Unspecified cirrhosis of liver: Secondary | ICD-10-CM | POA: Diagnosis not present

## 2014-11-11 NOTE — Patient Instructions (Signed)
Next blood work and ultrasound with Doppler to be performed in June 2016.

## 2014-11-11 NOTE — Progress Notes (Signed)
Presenting complaint;  Anemia and chronic liver disease.  Database;  Patient is 59 year old Caucasian male was 10 year history of cirrhosis complicated by refractory ascites leading to TIPS in September 2007 located by hepatic encephalopathy. Patient was recently evaluated by Dr. Ouida Sills for progressive weakness and noted to have hemoglobin of 7.3 g. Dr. Ouida Sills was concerned that he may be losing blood from GI tract and therefore wanted to be seen. In the meantime he was found to have a low serum B12 level and begun on B12 injections. He was last seen in our office in December 2015 when his H&H was 11.4 and 32.4. He also had upper abdominal ultrasound and no focal abnormalities noted in cirrhotic appearing liver and there was no evidence of ascites. His study showed cholelithiasis which is known to have from prior studies. Last EGD and colonoscopy was in 2009.  Subjective;  Patient states he feels much better since he was begun on B12 injections. He states his hemoglobin 1 week ago was 10.1 g and Dr. Alonza Smoker plan to check it again in a few weeks. He denies history of melena or rectal bleeding or hematuria. He denies heartburn nausea vomiting or abdominal pain. He states his weight has not changed in the last year. 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    Current Medications: Outpatient Encounter Prescriptions as of 11/11/2014  Medication Sig  . carvedilol (COREG) 6.25 MG tablet Take 6.25 mg by mouth 2 (two) times daily with a meal.  . ciprofloxacin (CIPRO) 750 MG tablet TAKE ONE TABLET BY MOUTH EVERY WEEK.  . Cyanocobalamin (VITAMIN B-12 IJ) Inject as directed once a week. Currently on starter - for 6 weeks then monthly.Currently in 4 th week  . furosemide (LASIX) 40 MG tablet TAKE 1 TABLET BY MOUTH EVERYDAY OR EVERY OTHER DAY.  Marland Kitchen glimepiride (AMARYL) 4 MG tablet Take 4 mg by mouth daily before breakfast.  . losartan  (COZAAR) 100 MG tablet Take 100 mg by mouth daily.  Marland Kitchen omeprazole (PRILOSEC) 20 MG capsule TAKE 1 CAPSULE BY MOUTH EVERY MORNING FOR ACID REFLUX.  . [DISCONTINUED] atenolol (TENORMIN) 25 MG tablet Take by mouth daily.     Objective: Blood pressure 142/70, pulse 66, temperature 98.5 F (36.9 C), temperature source Oral, resp. rate 18, height 5' 10.5" (1.791 m), weight 248 lb 4.8 oz (112.628 kg). Patient is alert and does not have asterixis. Conjunctiva is pink. Sclera is nonicteric Oropharyngeal mucosa is normal. No neck masses or thyromegaly noted. Cardiac exam with regular rhythm normal S1 and S2. No murmur or gallop noted. Lungs are clear to auscultation. Abdomen is full. On palpation is soft and nontender. Spleen is not palpable. Liver edge is firm and left lobe is easily palpable and midepigastric region. No LE edema or clubbing noted.  Labs/studies Results: Lab data from 10/15/2014  B12 was 114   folate 10.5 Serum iron to 81 and ferritin 545.  Assessment:  #1. Macrocytic anemia confirmed to be due to B12 deficiency and he has responded nicely to therapy. No evidence of GI bleed or an deficiency anemia. Therefore he does not need GI workup at this time. #2. Cirrhosis. Etiology felt to be cryptogenic or steatohepatitis. He was diagnosed in 2006 and rapidly developed refractory ascites and has done quite well with TIPS placed in September 2007 and was last confirmed to be patent on July 2015 study.  He is up-to-date on  HCC screening.   Plan:  Patient will have AFP in June 2016. Abdominal ultrasound with Doppler study in June 2016. He will continue to have periodic blood work through Dr. Alonza SmokerFagan's office. Office visit in 1 year.

## 2014-11-18 ENCOUNTER — Encounter (INDEPENDENT_AMBULATORY_CARE_PROVIDER_SITE_OTHER): Payer: Self-pay

## 2014-12-20 ENCOUNTER — Encounter (INDEPENDENT_AMBULATORY_CARE_PROVIDER_SITE_OTHER): Payer: Self-pay | Admitting: *Deleted

## 2014-12-29 ENCOUNTER — Other Ambulatory Visit (INDEPENDENT_AMBULATORY_CARE_PROVIDER_SITE_OTHER): Payer: Self-pay | Admitting: *Deleted

## 2014-12-29 ENCOUNTER — Encounter (INDEPENDENT_AMBULATORY_CARE_PROVIDER_SITE_OTHER): Payer: Self-pay | Admitting: *Deleted

## 2014-12-29 DIAGNOSIS — K7469 Other cirrhosis of liver: Secondary | ICD-10-CM

## 2015-01-19 LAB — CBC WITH DIFFERENTIAL/PLATELET
BASOS ABS: 0.1 10*3/uL (ref 0.0–0.1)
BASOS PCT: 1 % (ref 0–1)
EOS ABS: 0.4 10*3/uL (ref 0.0–0.7)
EOS PCT: 8 % — AB (ref 0–5)
HCT: 40 % (ref 39.0–52.0)
Hemoglobin: 13.3 g/dL (ref 13.0–17.0)
Lymphocytes Relative: 34 % (ref 12–46)
Lymphs Abs: 1.8 10*3/uL (ref 0.7–4.0)
MCH: 28.7 pg (ref 26.0–34.0)
MCHC: 33.3 g/dL (ref 30.0–36.0)
MCV: 86.4 fL (ref 78.0–100.0)
MONO ABS: 0.4 10*3/uL (ref 0.1–1.0)
MPV: 10.4 fL (ref 8.6–12.4)
Monocytes Relative: 8 % (ref 3–12)
Neutro Abs: 2.6 10*3/uL (ref 1.7–7.7)
Neutrophils Relative %: 49 % (ref 43–77)
PLATELETS: 128 10*3/uL — AB (ref 150–400)
RBC: 4.63 MIL/uL (ref 4.22–5.81)
RDW: 14.3 % (ref 11.5–15.5)
WBC: 5.3 10*3/uL (ref 4.0–10.5)

## 2015-01-19 LAB — HEPATIC FUNCTION PANEL
ALBUMIN: 3.4 g/dL — AB (ref 3.5–5.2)
ALT: 16 U/L (ref 0–53)
AST: 24 U/L (ref 0–37)
Alkaline Phosphatase: 105 U/L (ref 39–117)
BILIRUBIN TOTAL: 0.6 mg/dL (ref 0.2–1.2)
Bilirubin, Direct: 0.1 mg/dL (ref 0.0–0.3)
Indirect Bilirubin: 0.5 mg/dL (ref 0.2–1.2)
TOTAL PROTEIN: 6.7 g/dL (ref 6.0–8.3)

## 2015-01-19 LAB — AFP TUMOR MARKER: AFP-Tumor Marker: 2.1 ng/mL (ref <6.1)

## 2015-01-24 ENCOUNTER — Ambulatory Visit (INDEPENDENT_AMBULATORY_CARE_PROVIDER_SITE_OTHER): Payer: Medicaid Other | Admitting: Internal Medicine

## 2015-01-27 ENCOUNTER — Other Ambulatory Visit (INDEPENDENT_AMBULATORY_CARE_PROVIDER_SITE_OTHER): Payer: Self-pay | Admitting: Internal Medicine

## 2015-01-27 DIAGNOSIS — K7469 Other cirrhosis of liver: Secondary | ICD-10-CM

## 2015-01-27 DIAGNOSIS — Z95828 Presence of other vascular implants and grafts: Secondary | ICD-10-CM

## 2015-01-28 ENCOUNTER — Encounter (INDEPENDENT_AMBULATORY_CARE_PROVIDER_SITE_OTHER): Payer: Self-pay

## 2015-02-02 ENCOUNTER — Ambulatory Visit (HOSPITAL_COMMUNITY)
Admission: RE | Admit: 2015-02-02 | Discharge: 2015-02-02 | Disposition: A | Discharge status: Home / Self Care | Payer: Medicaid Other | Source: Ambulatory Visit | Attending: Internal Medicine | Admitting: Internal Medicine

## 2015-02-02 DIAGNOSIS — Z95828 Presence of other vascular implants and grafts: Secondary | ICD-10-CM

## 2015-02-02 DIAGNOSIS — R161 Splenomegaly, not elsewhere classified: Secondary | ICD-10-CM | POA: Insufficient documentation

## 2015-02-02 DIAGNOSIS — Z9889 Other specified postprocedural states: Secondary | ICD-10-CM | POA: Insufficient documentation

## 2015-02-02 DIAGNOSIS — K7460 Unspecified cirrhosis of liver: Secondary | ICD-10-CM | POA: Diagnosis not present

## 2015-02-02 DIAGNOSIS — K7469 Other cirrhosis of liver: Secondary | ICD-10-CM

## 2015-05-01 ENCOUNTER — Other Ambulatory Visit (INDEPENDENT_AMBULATORY_CARE_PROVIDER_SITE_OTHER): Payer: Self-pay | Admitting: Internal Medicine

## 2015-05-23 ENCOUNTER — Ambulatory Visit (INDEPENDENT_AMBULATORY_CARE_PROVIDER_SITE_OTHER): Payer: Medicaid Other | Admitting: Ophthalmology

## 2015-05-23 DIAGNOSIS — H2513 Age-related nuclear cataract, bilateral: Secondary | ICD-10-CM

## 2015-05-23 DIAGNOSIS — E11319 Type 2 diabetes mellitus with unspecified diabetic retinopathy without macular edema: Secondary | ICD-10-CM

## 2015-05-23 DIAGNOSIS — E113593 Type 2 diabetes mellitus with proliferative diabetic retinopathy without macular edema, bilateral: Secondary | ICD-10-CM | POA: Diagnosis not present

## 2015-05-23 DIAGNOSIS — H43813 Vitreous degeneration, bilateral: Secondary | ICD-10-CM | POA: Diagnosis not present

## 2015-05-23 DIAGNOSIS — H35033 Hypertensive retinopathy, bilateral: Secondary | ICD-10-CM

## 2015-05-23 DIAGNOSIS — I10 Essential (primary) hypertension: Secondary | ICD-10-CM

## 2015-08-19 ENCOUNTER — Encounter (INDEPENDENT_AMBULATORY_CARE_PROVIDER_SITE_OTHER): Payer: Self-pay | Admitting: Internal Medicine

## 2015-09-23 ENCOUNTER — Ambulatory Visit (INDEPENDENT_AMBULATORY_CARE_PROVIDER_SITE_OTHER): Payer: Medicaid Other | Admitting: Ophthalmology

## 2015-09-23 DIAGNOSIS — H2513 Age-related nuclear cataract, bilateral: Secondary | ICD-10-CM

## 2015-09-23 DIAGNOSIS — E113593 Type 2 diabetes mellitus with proliferative diabetic retinopathy without macular edema, bilateral: Secondary | ICD-10-CM | POA: Diagnosis not present

## 2015-09-23 DIAGNOSIS — E11319 Type 2 diabetes mellitus with unspecified diabetic retinopathy without macular edema: Secondary | ICD-10-CM

## 2015-09-23 DIAGNOSIS — H35033 Hypertensive retinopathy, bilateral: Secondary | ICD-10-CM | POA: Diagnosis not present

## 2015-09-23 DIAGNOSIS — I10 Essential (primary) hypertension: Secondary | ICD-10-CM | POA: Diagnosis not present

## 2015-09-23 DIAGNOSIS — H43813 Vitreous degeneration, bilateral: Secondary | ICD-10-CM

## 2015-11-16 ENCOUNTER — Ambulatory Visit (INDEPENDENT_AMBULATORY_CARE_PROVIDER_SITE_OTHER): Payer: Medicaid Other | Admitting: Internal Medicine

## 2016-03-22 ENCOUNTER — Ambulatory Visit (INDEPENDENT_AMBULATORY_CARE_PROVIDER_SITE_OTHER): Payer: Medicaid Other | Admitting: Ophthalmology

## 2016-03-22 DIAGNOSIS — I10 Essential (primary) hypertension: Secondary | ICD-10-CM | POA: Diagnosis not present

## 2016-03-22 DIAGNOSIS — H43813 Vitreous degeneration, bilateral: Secondary | ICD-10-CM | POA: Diagnosis not present

## 2016-03-22 DIAGNOSIS — E113593 Type 2 diabetes mellitus with proliferative diabetic retinopathy without macular edema, bilateral: Secondary | ICD-10-CM

## 2016-03-22 DIAGNOSIS — H2513 Age-related nuclear cataract, bilateral: Secondary | ICD-10-CM

## 2016-03-22 DIAGNOSIS — E11319 Type 2 diabetes mellitus with unspecified diabetic retinopathy without macular edema: Secondary | ICD-10-CM

## 2016-03-22 DIAGNOSIS — H35033 Hypertensive retinopathy, bilateral: Secondary | ICD-10-CM

## 2016-06-07 ENCOUNTER — Other Ambulatory Visit (HOSPITAL_COMMUNITY): Payer: Self-pay | Admitting: Internal Medicine

## 2016-06-07 DIAGNOSIS — K7460 Unspecified cirrhosis of liver: Secondary | ICD-10-CM

## 2016-06-15 ENCOUNTER — Ambulatory Visit (HOSPITAL_COMMUNITY)
Admission: RE | Admit: 2016-06-15 | Discharge: 2016-06-15 | Disposition: A | Discharge status: Home / Self Care | Payer: Medicaid Other | Source: Ambulatory Visit | Attending: Internal Medicine | Admitting: Internal Medicine

## 2016-06-15 DIAGNOSIS — K7460 Unspecified cirrhosis of liver: Secondary | ICD-10-CM | POA: Diagnosis present

## 2016-06-15 DIAGNOSIS — R161 Splenomegaly, not elsewhere classified: Secondary | ICD-10-CM | POA: Insufficient documentation

## 2016-06-15 DIAGNOSIS — K8020 Calculus of gallbladder without cholecystitis without obstruction: Secondary | ICD-10-CM | POA: Diagnosis not present

## 2016-07-24 ENCOUNTER — Ambulatory Visit (INDEPENDENT_AMBULATORY_CARE_PROVIDER_SITE_OTHER): Payer: Medicaid Other | Admitting: Ophthalmology

## 2016-07-24 DIAGNOSIS — H43813 Vitreous degeneration, bilateral: Secondary | ICD-10-CM | POA: Diagnosis not present

## 2016-07-24 DIAGNOSIS — I10 Essential (primary) hypertension: Secondary | ICD-10-CM | POA: Diagnosis not present

## 2016-07-24 DIAGNOSIS — H35033 Hypertensive retinopathy, bilateral: Secondary | ICD-10-CM

## 2016-07-24 DIAGNOSIS — E113593 Type 2 diabetes mellitus with proliferative diabetic retinopathy without macular edema, bilateral: Secondary | ICD-10-CM | POA: Diagnosis not present

## 2016-07-24 DIAGNOSIS — E11319 Type 2 diabetes mellitus with unspecified diabetic retinopathy without macular edema: Secondary | ICD-10-CM

## 2016-12-27 ENCOUNTER — Encounter (HOSPITAL_COMMUNITY): Payer: Self-pay | Admitting: *Deleted

## 2016-12-27 ENCOUNTER — Emergency Department (HOSPITAL_COMMUNITY)
Admission: EM | Admit: 2016-12-27 | Discharge: 2016-12-27 | Disposition: A | Discharge status: Home / Self Care | Payer: Medicaid Other | Attending: Emergency Medicine | Admitting: Emergency Medicine

## 2016-12-27 ENCOUNTER — Emergency Department (HOSPITAL_COMMUNITY): Payer: Medicaid Other

## 2016-12-27 DIAGNOSIS — S6991XA Unspecified injury of right wrist, hand and finger(s), initial encounter: Secondary | ICD-10-CM | POA: Diagnosis present

## 2016-12-27 DIAGNOSIS — Z7984 Long term (current) use of oral hypoglycemic drugs: Secondary | ICD-10-CM | POA: Diagnosis not present

## 2016-12-27 DIAGNOSIS — Y92009 Unspecified place in unspecified non-institutional (private) residence as the place of occurrence of the external cause: Secondary | ICD-10-CM | POA: Diagnosis not present

## 2016-12-27 DIAGNOSIS — Z79899 Other long term (current) drug therapy: Secondary | ICD-10-CM | POA: Diagnosis not present

## 2016-12-27 DIAGNOSIS — W270XXA Contact with workbench tool, initial encounter: Secondary | ICD-10-CM | POA: Diagnosis not present

## 2016-12-27 DIAGNOSIS — F17210 Nicotine dependence, cigarettes, uncomplicated: Secondary | ICD-10-CM | POA: Insufficient documentation

## 2016-12-27 DIAGNOSIS — E119 Type 2 diabetes mellitus without complications: Secondary | ICD-10-CM | POA: Diagnosis not present

## 2016-12-27 DIAGNOSIS — S62521B Displaced fracture of distal phalanx of right thumb, initial encounter for open fracture: Secondary | ICD-10-CM | POA: Insufficient documentation

## 2016-12-27 DIAGNOSIS — Y939 Activity, unspecified: Secondary | ICD-10-CM | POA: Diagnosis not present

## 2016-12-27 DIAGNOSIS — Y999 Unspecified external cause status: Secondary | ICD-10-CM | POA: Insufficient documentation

## 2016-12-27 MED ORDER — CEFAZOLIN SODIUM-DEXTROSE 1-4 GM/50ML-% IV SOLN
1.0000 g | Freq: Once | INTRAVENOUS | Status: AC
  Administered 2016-12-27: 1 g via INTRAVENOUS
  Filled 2016-12-27: qty 50

## 2016-12-27 MED ORDER — BUPIVACAINE HCL (PF) 0.5 % IJ SOLN
INTRAMUSCULAR | Status: AC
  Filled 2016-12-27: qty 30

## 2016-12-27 MED ORDER — SULFAMETHOXAZOLE-TRIMETHOPRIM 800-160 MG PO TABS: 1.0000 | ORAL_TABLET | Freq: Two times a day (BID) | ORAL | 0 refills | Status: AC

## 2016-12-27 MED ORDER — OXYCODONE-ACETAMINOPHEN 5-325 MG PO TABS: 1.0000 | ORAL_TABLET | ORAL | 0 refills | Status: DC | PRN

## 2016-12-27 MED ORDER — HYDROGEN PEROXIDE 3 % EX SOLN
CUTANEOUS | Status: DC
Start: 2016-12-27 — End: 2016-12-28
  Filled 2016-12-27: qty 473

## 2016-12-27 MED ORDER — BUPIVACAINE HCL (PF) 0.5 % IJ SOLN
10.0000 mL | Freq: Once | INTRAMUSCULAR | Status: AC
  Administered 2016-12-27: 10 mL

## 2016-12-27 NOTE — ED Notes (Signed)
Pt states he was attempting to steady a board while cutting it using a table saw. Large laceration to R thumb, small amount of blood oozing at this time. Denies blood thinner use.

## 2016-12-27 NOTE — ED Triage Notes (Signed)
Laceration to right thumb, cut with a table saw

## 2016-12-27 NOTE — Discharge Instructions (Signed)
Do not eat or drink anything after midnight tonight.  Have your wife drive you to Dr. Merrilee SeashoreKuzma's office tomorrow morning - he wants to see you between 9 and 9:30 and will be arranging for you to have surgery to complete the repair of your injury.  Take your medicines with you when you go to [place], but do not take your glimepiride in the morning since you will not be able to eat. You may take your other medicines at your regular time with a sip of water.  You are being given several pain pills this evening (oxycodone) this medicine will make you sleepy - do not drive within 4 hours of taking this medicine.

## 2016-12-27 NOTE — ED Provider Notes (Signed)
[facility]-EMERGENCY DEPT Provider Note   CSN: [id number] Arrival date & time: 12/27/16  1958     History   Chief Complaint Chief Complaint  Patient presents with  . Laceration    HPI [name] Schillingsorris M [name] is a 61 y.o. right handed male presenting with laceration injury to his right thumb occurring prior to arrival when he was sawing wood at home as he was making a bee hive.  He states the thumb was "crooked" at the tip until he straightened it so is sure he has a fracture.  He has numbness distally at laceration edge of the finger, but sensation is present on the opposite side.  He has applied pressure but the wound continues to bleed.  His last tetanus was updated 2 years ago by his pcp, per patient report.  The history is provided by the patient and the spouse.    Past Medical History:  Diagnosis Date  . Cryptogenic cirrhosis (HCC)   . Diabetes mellitus   . GERD (gastroesophageal reflux disease)   . Liver disease     Patient Active Problem List   Diagnosis Date Noted  . Cryptogenic cirrhosis (HCC) 10/23/2011  . DM (diabetes mellitus) (HCC) 10/23/2011  . GERD (gastroesophageal reflux disease) 10/23/2011  . 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 COLONOSCOPY    . LIVER BIOPSY    . TIPS PROCEDURE  2008  . UPPER GASTROINTESTINAL ENDOSCOPY         Home Medications    Prior to Admission medications   Medication Sig Start Date End Date Taking? Authorizing Provider  carvedilol (COREG) 6.25 MG tablet Take 6.25 mg by mouth 2 (two) times daily with a meal.    [provider]  ciprofloxacin (CIPRO) 750 MG tablet TAKE ONE TABLET BY MOUTH EVERY WEEK. 05/03/15   Setzer, Brand Maleserri L, NP  Cyanocobalamin (VITAMIN B-12 IJ) Inject as directed once a week. Currently on starter - for 6 weeks then monthly.Currently in 4 th week    [provider]  furosemide (LASIX) 40 MG tablet TAKE 1 TABLET BY  MOUTH EVERYDAY OR EVERY OTHER DAY. 09/03/14   Setzer, Brand Maleserri L, NP  glimepiride (AMARYL) 4 MG tablet Take 4 mg by mouth daily before breakfast.    [provider]  losartan (COZAAR) 100 MG tablet Take 100 mg by mouth daily.    [provider]  omeprazole (PRILOSEC) 20 MG capsule TAKE 1 CAPSULE BY MOUTH EVERY MORNING FOR ACID REFLUX. 07/05/14   Rehman, Joline MaxcyNajeeb U, MD  oxyCODONE-acetaminophen (PERCOCET/ROXICET) 5-325 MG tablet Take 1 tablet by mouth every 4 (four) hours as needed for severe pain. 12/27/16   Burgess AmorIdol, Alianna Wurster, PA-C  sulfamethoxazole-trimethoprim (BACTRIM DS,SEPTRA DS) 800-160 MG tablet Take 1 tablet by mouth 2 (two) times daily. 12/27/16 01/03/17  Burgess AmorIdol, Derel Mcglasson, PA-C    Family History Family History  Problem Relation Age of Onset  . Healthy Mother   . Diabetes Father   . Heart disease Father   . Healthy Sister   . Healthy Sister     Social History Social History  Substance Use Topics  . Smoking status: Current Every Day Smoker    Packs/day: 1.00    Years: 40.00    Types: Cigarettes  . Smokeless tobacco: Never Used     Comment: patient states that in the 40 years he has stopped smoking then start again  . Alcohol use No     Allergies  Patient has no known allergies.   Review of Systems Review of Systems  Constitutional: Negative for chills and fever.  Respiratory: Negative for shortness of breath and wheezing.   Musculoskeletal: Positive for arthralgias. Negative for joint swelling and myalgias.  Skin: Positive for wound.  Neurological: Negative for weakness and numbness.     Physical Exam Updated Vital Signs BP (!) 201/67 (BP Location: Left Arm)   Pulse 66   Temp 98.7 F (37.1 C) (Oral)   Resp 18   SpO2 96%   Physical Exam  Constitutional: He is oriented to person, place, and time. He appears well-developed and well-nourished.  HENT:  Head: Normocephalic.  Cardiovascular: Normal rate.   Pulmonary/Chest: Effort normal.  Musculoskeletal: He  exhibits tenderness and deformity.       Right hand: He exhibits laceration. He exhibits normal capillary refill.  Pt can flex and extend distal phalanx, but limited by pain.  Less than 2 sec cap refill in finger tip.  Decreased sensation radial side, sensation intact ulnar side.  Distal phalanx unstable. Laceration dorsally through the lateral nail plate, around the distal finger tip and extends to the proximal finger tuft.  Irregular wound, partial avulsion mid tuft.  Neurological: He is alert and oriented to person, place, and time. No sensory deficit.  Skin: Laceration noted.     ED Treatments / Results  Labs (all labs ordered are listed, but only abnormal results are displayed) Labs Reviewed - No data to display  EKG  EKG Interpretation None       Radiology Dg Finger Thumb Right  Result Date: 12/27/2016 CLINICAL DATA:  61 y/o  M; laceration to the right thumb. EXAM: RIGHT THUMB 2+V COMPARISON:  None. FINDINGS: Oblique mildly displaced acute fracture with comminuted fragments extending from lateral aspect of the first distal phalanx tuft to mid base with intra-articular extension. Extensive soft tissue abnormality. IMPRESSION: Oblique mildly displaced acute fracture with comminuted fragments extending from lateral aspect of the first distal phalanx tuft to mid base with intra-articular extension. Electronically Signed   By: Mitzi Hansen M.D.   On: 12/27/2016 20:45    Procedures Procedures (including critical care time)  LACERATION REPAIR Performed by: Burgess Amor Authorized by: Burgess Amor Consent: Verbal consent obtained. Risks and benefits: risks, benefits and alternatives were discussed Consent given by: patient Patient identity confirmed: provided demographic data Prepped and Draped in normal sterile fashion.  Copious flushing using NS after betadine wash.  Wound explored  Laceration Location: right distal thumb  Laceration Length:  3 cm including avulsion  on tuft side of finger  No Foreign Bodies seen or palpated  Anesthesia: digital block  Local anesthetic: marcaine 0.5% without epinephrine  Anesthetic total: 4 ml  Irrigation method: syringe Amount of cleaning: standard  Skin closure: chromic gut 4-0  Number of sutures: 5 sutures, loosely approximated  Technique: simple interupted  Patient tolerance: Patient tolerated the procedure well with no immediate complications.  Shredded strands of skin at avulsion site removed.   Medications Ordered in ED Medications  hydrogen peroxide 3 % external solution (not administered)  bupivacaine (MARCAINE) 0.5 % injection 10 mL (10 mLs Infiltration Given by Other 12/27/16 2030)  ceFAZolin (ANCEF) IVPB 1 g/50 mL premix (0 g Intravenous Stopped 12/27/16 2252)     Initial Impression / Assessment and Plan / ED Course  I have reviewed the triage vital signs and the nursing notes.  Pertinent labs & imaging results that were available during my care of the patient were  reviewed by me and considered in my medical decision making (see chart for details).     Discussed with Dr. Merlyn Lot who will see pt in office in am. Pt to stay npo after MN, will plan for surgical repair tomorrow.  Ancef IV given here. Oxycodone pre pack for pain relief.  Bactrim prescribed.  Pt and wife aware and agree with plan.   Final Clinical Impressions(s) / ED Diagnoses   Final diagnoses:  Open displaced fracture of distal phalanx of right thumb, initial encounter    New Prescriptions Discharge Medication List as of 12/27/2016 11:08 PM    START taking these medications   Details  oxyCODONE-acetaminophen (PERCOCET/ROXICET) 5-325 MG tablet Take 1 tablet by mouth every 4 (four) hours as needed for severe pain., Starting Thu 12/27/2016, Print    sulfamethoxazole-trimethoprim (BACTRIM DS,SEPTRA DS) 800-160 MG tablet Take 1 tablet by mouth 2 (two) times daily., Starting Thu 12/27/2016, Until Thu 01/03/2017, Print           Burgess Amor, PA-C 12/28/16 0100    Dione Booze, MD 12/28/16 2242

## 2016-12-28 ENCOUNTER — Encounter (HOSPITAL_BASED_OUTPATIENT_CLINIC_OR_DEPARTMENT_OTHER)
Admission: RE | Disposition: A | Discharge status: Home / Self Care | Payer: Self-pay | Source: Ambulatory Visit | Attending: Orthopedic Surgery

## 2016-12-28 ENCOUNTER — Ambulatory Visit (HOSPITAL_BASED_OUTPATIENT_CLINIC_OR_DEPARTMENT_OTHER)
Admission: RE | Admit: 2016-12-28 | Discharge: 2016-12-28 | Disposition: A | Discharge status: Home / Self Care | Payer: Medicaid Other | Source: Ambulatory Visit | Attending: Orthopedic Surgery | Admitting: Orthopedic Surgery

## 2016-12-28 ENCOUNTER — Encounter (HOSPITAL_BASED_OUTPATIENT_CLINIC_OR_DEPARTMENT_OTHER): Payer: Self-pay | Admitting: *Deleted

## 2016-12-28 ENCOUNTER — Ambulatory Visit (HOSPITAL_BASED_OUTPATIENT_CLINIC_OR_DEPARTMENT_OTHER): Payer: Medicaid Other | Admitting: Anesthesiology

## 2016-12-28 ENCOUNTER — Other Ambulatory Visit: Payer: Self-pay | Admitting: Orthopedic Surgery

## 2016-12-28 DIAGNOSIS — E119 Type 2 diabetes mellitus without complications: Secondary | ICD-10-CM | POA: Diagnosis not present

## 2016-12-28 DIAGNOSIS — Z79899 Other long term (current) drug therapy: Secondary | ICD-10-CM | POA: Insufficient documentation

## 2016-12-28 DIAGNOSIS — Z7984 Long term (current) use of oral hypoglycemic drugs: Secondary | ICD-10-CM | POA: Insufficient documentation

## 2016-12-28 DIAGNOSIS — F17210 Nicotine dependence, cigarettes, uncomplicated: Secondary | ICD-10-CM | POA: Diagnosis not present

## 2016-12-28 DIAGNOSIS — S61111A Laceration without foreign body of right thumb with damage to nail, initial encounter: Secondary | ICD-10-CM | POA: Diagnosis not present

## 2016-12-28 DIAGNOSIS — S6401XA Injury of ulnar nerve at wrist and hand level of right arm, initial encounter: Secondary | ICD-10-CM | POA: Insufficient documentation

## 2016-12-28 DIAGNOSIS — W312XXA Contact with powered woodworking and forming machines, initial encounter: Secondary | ICD-10-CM | POA: Diagnosis not present

## 2016-12-28 DIAGNOSIS — K219 Gastro-esophageal reflux disease without esophagitis: Secondary | ICD-10-CM | POA: Diagnosis not present

## 2016-12-28 DIAGNOSIS — I10 Essential (primary) hypertension: Secondary | ICD-10-CM | POA: Diagnosis not present

## 2016-12-28 DIAGNOSIS — S62521B Displaced fracture of distal phalanx of right thumb, initial encounter for open fracture: Secondary | ICD-10-CM | POA: Insufficient documentation

## 2016-12-28 DIAGNOSIS — S61011A Laceration without foreign body of right thumb without damage to nail, initial encounter: Secondary | ICD-10-CM | POA: Diagnosis present

## 2016-12-28 DIAGNOSIS — S66021A Laceration of long flexor muscle, fascia and tendon of right thumb at wrist and hand level, initial encounter: Secondary | ICD-10-CM | POA: Insufficient documentation

## 2016-12-28 HISTORY — DX: Essential (primary) hypertension: I10

## 2016-12-28 HISTORY — PX: NERVE, TENDON AND ARTERY REPAIR: SHX5695

## 2016-12-28 HISTORY — PX: PERCUTANEOUS PINNING: SHX2209

## 2016-12-28 HISTORY — PX: INCISION AND DRAINAGE OF WOUND: SHX1803

## 2016-12-28 HISTORY — DX: Anemia, unspecified: D64.9

## 2016-12-28 LAB — POCT I-STAT, CHEM 8
BUN: 15 mg/dL (ref 6–20)
CHLORIDE: 109 mmol/L (ref 101–111)
Calcium, Ion: 1.15 mmol/L (ref 1.15–1.40)
Creatinine, Ser: 1.1 mg/dL (ref 0.61–1.24)
GLUCOSE: 121 mg/dL — AB (ref 65–99)
HEMATOCRIT: 40 % (ref 39.0–52.0)
Hemoglobin: 13.6 g/dL (ref 13.0–17.0)
POTASSIUM: 4.1 mmol/L (ref 3.5–5.1)
Sodium: 142 mmol/L (ref 135–145)
TCO2: 22 mmol/L (ref 0–100)

## 2016-12-28 LAB — GLUCOSE, CAPILLARY: GLUCOSE-CAPILLARY: 140 mg/dL — AB (ref 65–99)

## 2016-12-28 SURGERY — PINNING, EXTREMITY, PERCUTANEOUS
Anesthesia: Monitor Anesthesia Care | Site: Thumb | Laterality: Right

## 2016-12-28 MED ORDER — MIDAZOLAM HCL 2 MG/2ML IJ SOLN
INTRAMUSCULAR | Status: AC
  Filled 2016-12-28: qty 2

## 2016-12-28 MED ORDER — PROPOFOL 10 MG/ML IV BOLUS
INTRAVENOUS | Status: AC
  Filled 2016-12-28: qty 20

## 2016-12-28 MED ORDER — LACTATED RINGERS IV SOLN
INTRAVENOUS | Status: DC
  Administered 2016-12-28: 13:00:00 via INTRAVENOUS

## 2016-12-28 MED ORDER — CEFAZOLIN SODIUM-DEXTROSE 2-4 GM/100ML-% IV SOLN
2.0000 g | INTRAVENOUS | Status: AC
  Administered 2016-12-28: 2 g via INTRAVENOUS

## 2016-12-28 MED ORDER — ONDANSETRON HCL 4 MG/2ML IJ SOLN
INTRAMUSCULAR | Status: DC | PRN
  Administered 2016-12-28: 4 mg via INTRAVENOUS

## 2016-12-28 MED ORDER — CEFAZOLIN SODIUM-DEXTROSE 2-4 GM/100ML-% IV SOLN
INTRAVENOUS | Status: AC
  Filled 2016-12-28: qty 100

## 2016-12-28 MED ORDER — PROPOFOL 10 MG/ML IV BOLUS
INTRAVENOUS | Status: DC | PRN
  Administered 2016-12-28: 10 mg via INTRAVENOUS
  Administered 2016-12-28: 20 mg via INTRAVENOUS

## 2016-12-28 MED ORDER — CHLORHEXIDINE GLUCONATE 4 % EX LIQD: 60.0000 mL | Freq: Once | CUTANEOUS | Status: DC

## 2016-12-28 MED ORDER — OXYCODONE-ACETAMINOPHEN 5-325 MG PO TABS: ORAL_TABLET | ORAL | 0 refills | Status: DC

## 2016-12-28 MED ORDER — MIDAZOLAM HCL 2 MG/2ML IJ SOLN: 1.0000 mg | INTRAMUSCULAR | Status: DC | PRN

## 2016-12-28 MED ORDER — SULFAMETHOXAZOLE-TRIMETHOPRIM 800-160 MG PO TABS: 1.0000 | ORAL_TABLET | Freq: Two times a day (BID) | ORAL | 0 refills | Status: DC

## 2016-12-28 MED ORDER — BUPIVACAINE-EPINEPHRINE (PF) 0.5% -1:200000 IJ SOLN
INTRAMUSCULAR | Status: DC | PRN
  Administered 2016-12-28: 30 mL via PERINEURAL

## 2016-12-28 MED ORDER — SCOPOLAMINE 1 MG/3DAYS TD PT72: 1.0000 | MEDICATED_PATCH | Freq: Once | TRANSDERMAL | Status: DC | PRN

## 2016-12-28 MED ORDER — OXYCODONE HCL 5 MG PO TABS: ORAL_TABLET | ORAL | 0 refills | Status: DC

## 2016-12-28 MED ORDER — FENTANYL CITRATE (PF) 100 MCG/2ML IJ SOLN: 50.0000 ug | INTRAMUSCULAR | Status: DC | PRN

## 2016-12-28 MED ORDER — PROPOFOL 500 MG/50ML IV EMUL
INTRAVENOUS | Status: DC | PRN
  Administered 2016-12-28: 75 ug/kg/min via INTRAVENOUS

## 2016-12-28 MED ORDER — FENTANYL CITRATE (PF) 100 MCG/2ML IJ SOLN
INTRAMUSCULAR | Status: AC
  Filled 2016-12-28: qty 2

## 2016-12-28 SURGICAL SUPPLY — 59 items
BANDAGE ACE 3X5.8 VEL STRL LF (GAUZE/BANDAGES/DRESSINGS) ×3 IMPLANT
BANDAGE ACE 4X5 VEL STRL LF (GAUZE/BANDAGES/DRESSINGS) IMPLANT
BLADE SURG 15 STRL LF DISP TIS (BLADE) ×2 IMPLANT
BLADE SURG 15 STRL SS (BLADE) ×6
BNDG CMPR 9X4 STRL LF SNTH (GAUZE/BANDAGES/DRESSINGS) ×1
BNDG ELASTIC 2X5.8 VLCR STR LF (GAUZE/BANDAGES/DRESSINGS) IMPLANT
BNDG ESMARK 4X9 LF (GAUZE/BANDAGES/DRESSINGS) ×3 IMPLANT
BNDG GAUZE ELAST 4 BULKY (GAUZE/BANDAGES/DRESSINGS) ×3 IMPLANT
CHLORAPREP W/TINT 26ML (MISCELLANEOUS) IMPLANT
CORDS BIPOLAR (ELECTRODE) ×3 IMPLANT
COVER BACK TABLE 60X90IN (DRAPES) ×3 IMPLANT
COVER MAYO STAND STRL (DRAPES) ×3 IMPLANT
CUFF TOURNIQUET SINGLE 18IN (TOURNIQUET CUFF) ×3 IMPLANT
DRAPE EXTREMITY T 121X128X90 (DRAPE) ×3 IMPLANT
DRAPE OEC MINIVIEW 54X84 (DRAPES) ×3 IMPLANT
DRAPE SURG 17X23 STRL (DRAPES) ×3 IMPLANT
GAUZE SPONGE 4X4 12PLY STRL (GAUZE/BANDAGES/DRESSINGS) ×3 IMPLANT
GAUZE SPONGE 4X4 16PLY XRAY LF (GAUZE/BANDAGES/DRESSINGS) IMPLANT
GAUZE XEROFORM 1X8 LF (GAUZE/BANDAGES/DRESSINGS) ×3 IMPLANT
GLOVE BIO SURGEON STRL SZ 6.5 (GLOVE) ×4 IMPLANT
GLOVE BIO SURGEON STRL SZ7.5 (GLOVE) ×3 IMPLANT
GLOVE BIO SURGEONS STRL SZ 6.5 (GLOVE) ×2
GLOVE BIOGEL PI IND STRL 7.0 (GLOVE) ×1 IMPLANT
GLOVE BIOGEL PI IND STRL 8 (GLOVE) ×2 IMPLANT
GLOVE BIOGEL PI IND STRL 8.5 (GLOVE) ×3 IMPLANT
GLOVE BIOGEL PI INDICATOR 7.0 (GLOVE) ×2
GLOVE BIOGEL PI INDICATOR 8 (GLOVE) ×4
GLOVE BIOGEL PI INDICATOR 8.5 (GLOVE) ×6
GOWN STRL REUS W/ TWL LRG LVL3 (GOWN DISPOSABLE) ×2 IMPLANT
GOWN STRL REUS W/ TWL XL LVL3 (GOWN DISPOSABLE) IMPLANT
GOWN STRL REUS W/TWL LRG LVL3 (GOWN DISPOSABLE) ×6
GOWN STRL REUS W/TWL XL LVL3 (GOWN DISPOSABLE) ×6 IMPLANT
K-WIRE .035X4 (WIRE) ×6 IMPLANT
NEEDLE HYPO 22GX1.5 SAFETY (NEEDLE) IMPLANT
NS IRRIG 1000ML POUR BTL (IV SOLUTION) IMPLANT
PACK BASIN DAY SURGERY FS (CUSTOM PROCEDURE TRAY) ×3 IMPLANT
PAD CAST 3X4 CTTN HI CHSV (CAST SUPPLIES) ×1 IMPLANT
PAD CAST 4YDX4 CTTN HI CHSV (CAST SUPPLIES) IMPLANT
PADDING CAST COTTON 3X4 STRL (CAST SUPPLIES) ×3
PADDING CAST COTTON 4X4 STRL (CAST SUPPLIES)
SPLINT PLASTER CAST XFAST 3X15 (CAST SUPPLIES) IMPLANT
SPLINT PLASTER XTRA FASTSET 3X (CAST SUPPLIES)
STOCKINETTE 4X48 STRL (DRAPES) ×3 IMPLANT
SUCTION FRAZIER HANDLE 10FR (MISCELLANEOUS) ×2
SUCTION TUBE FRAZIER 10FR DISP (MISCELLANEOUS) ×1 IMPLANT
SUT CHROMIC 6 0 PS 4 (SUTURE) ×6 IMPLANT
SUT ETHILON 3 0 PS 1 (SUTURE) IMPLANT
SUT ETHILON 4 0 PS 2 18 (SUTURE) ×3 IMPLANT
SUT ETHILON 5 0 P 3 18 (SUTURE)
SUT ETHILON 6 0 P 1 (SUTURE) IMPLANT
SUT MON AB 5-0 P3 18 (SUTURE) ×3 IMPLANT
SUT NYLON ETHILON 5-0 P-3 1X18 (SUTURE) IMPLANT
SUT POLY BUTTON 15MM (SUTURE) ×3 IMPLANT
SUT PROLENE 2 0 SH DA (SUTURE) ×3 IMPLANT
SUT VIC AB 3-0 FS2 27 (SUTURE) IMPLANT
SYR CONTROL 10ML LL (SYRINGE) IMPLANT
TUBE CONNECTING 20'X1/4 (TUBING) ×1
TUBE CONNECTING 20X1/4 (TUBING) ×2 IMPLANT
UNDERPAD 30X30 (UNDERPADS AND DIAPERS) ×3 IMPLANT

## 2016-12-28 NOTE — Anesthesia Procedure Notes (Signed)
Anesthesia Regional Block: Supraclavicular block   Pre-Anesthetic Checklist: ,, timeout performed, Correct Patient, Correct Site, Correct Laterality, Correct Procedure, Correct Position, site marked, Risks and benefits discussed,  Surgical consent,  Pre-op evaluation,  At surgeon's request and post-op pain management  Laterality: Right  Prep: chloraprep       Needles:  Injection technique: Single-shot  Needle Type: Echogenic Stimulator Needle     Needle Length: 9cm  Needle Gauge: 21     Additional Needles:   Procedures: ultrasound guided, nerve stimulator,,,,,,   Nerve Stimulator or Paresthesia:  Response: 0.4 mA,   Additional Responses:   Narrative:  Start time: 12/28/2016 12:58 PM End time: 12/28/2016 1:08 PM Injection made incrementally with aspirations every 5 mL.  Performed by: Personally  Anesthesiologist: Arta BruceSSEY, Gerod Caligiuri  Additional Notes: Monitors applied. Patient sedated. Sterile prep and drape,hand hygiene and sterile gloves were used. Relevant anatomy identified.Needle position confirmed.Local anesthetic injected incrementally after negative aspiration. Local anesthetic spread visualized around nerve(s). Vascular puncture avoided. No complications. Image printed for medical record.The patient tolerated the procedure well.

## 2016-12-28 NOTE — Op Note (Signed)
[name], [name] NO.:  [id number]  MEDICAL RECORD NO.:  [id number]  LOCATION:                                 FACILITY:  PHYSICIAN:  Betha Loa, MD             DATE OF BIRTH:  DATE OF PROCEDURE:  12/28/2016 DATE OF DISCHARGE:                              OPERATIVE REPORT   PREOPERATIVE DIAGNOSES:  Right thumb table saw injury with open distal phalanx fracture.  POSTOPERATIVE DIAGNOSES:  Right thumb table saw injury with open distal phalanx fracture, flexor pollicis longus laceration, and ulnar digital nerve and artery laceration.  PROCEDURE:   1. Right thumb irrigation and debridement of open distal phalanx fracture including skin, subcutaneous tissue and bone  2. Open reduction and pin fixation of intra-articular distal phalanx fracture 3. Repair of zone #1 flexor tendon laceration over a button  4. Repair of ulnar digital nerve under microscope 5. Repair of ulnar artery under microscope 6. Right thumb nail bed repair.  SURGEON:  Betha Loa, MD.  ASSISTANT:  Cindee Salt, MD.  ANESTHESIA:  Regional with sedation.  IV FLUIDS:  Per anesthesia flow sheet.  ESTIMATED BLOOD LOSS:  Minimal.  COMPLICATIONS:  None.  SPECIMENS:  None.  TOURNIQUET TIME:  105 minutes.  DISPOSITION:  Stable to PACU.  INDICATIONS:  [name] [name] is a 61 year old male, who last night was using a table saw when he sustained a laceration to the right thumb on the blade.  He was seen at [facility] Emergency Department where the wound was irrigated and sutured.  He followed up in the office.  I recommended operative treatment.  Risks, benefits, and alternatives of surgery were discussed including the risk of blood loss; infection; damage to nerves, vessels, tendons, ligaments, bone for surgery, need for additional surgery, complications with wound healing, continued pain, and stiffness.  He voiced understanding of these risks and elected to proceed.  OPERATIVE COURSE:   After being identified preoperatively by myself, the patient and I agreed upon procedure and site of procedure.  Surgical site was marked.  Risks, benefits, and alternatives of surgery were reviewed and wished to proceed.  Surgical consent had been signed.  He was given IV Ancef as preoperative antibiotic prophylaxis.  He was transferred to the operating room and placed on the operating room table in supine position with the right upper extremity on arm board.  A regional block had been performed by Anesthesia in preoperative holding. Sedation was induced in the operating room.  Right upper extremity was prepped and draped in normal sterile orthopedic fashion.  Surgical pause was performed between surgeons, anesthesia, and operating staff; and all were in agreement as to the patient procedure and site of procedure. Tourniquet at the proximal aspect of the extremity was inflated to 250 mmHg after exsanguination of the limb with an Esmarch bandage.  The sutures were removed.  The wound was explored.  There was a fracture through the distal phalanx involving the articular surface.  The laceration went through the nail bed.  The wound was debrided of contaminated hematoma.  The scissors were used to sharply debride the devitalized skin.  There was a piece of bone, which was removed as well. The wound was copiously irrigated with sterile saline.  There was laceration of the FPL tendon from the larger piece of distal phalanx. It was still attached to the smaller radial-sided fragment.  The fracture was then stabilized with two 0.035 inch K-wires.  These were advanced from the tip of the thumb across the fracture and DIP joint. This was adequate to stabilize the thumb.  C-arm was used in AP and lateral projections to ensure appropriate reduction and position of the pins, which was the case.  The dorsal skin and nailbed were then repaired.  The skin was repaired with 5-0 Monocryl suture and the  nail bed was repaired with 6-0 chromic suture.  The nail had been removed with a Therapist, nutritionalreer elevator.  The laceration in the nail bed was at the radial edge coursing more centrally at the proximal aspect.  Attention was turned to the flexor tendon.  A 2-0 Prolene suture was passed in a Bunnell fashion through the tendon.  It was then passed through the dorsal aspect of the thumb and tied over a button on the dorsum of the thumb.  A piece of Xeroform had been placed on the nail bed and in the nail fold.  The area was padded with Webril suture.  This provided good reapproximation of the tendon to the bone.  The pins had been bent and cut short.  The microscope was brought in.  The ulnar digital artery was 100% lacerated.  The damaged ends were freshened.  Clot was removed from the lumen.  A 9-0 nylon suture was used in an interrupted circumferential fashion to reapproximate the arterial ends.  Good reapproximation was obtained.  The laceration to the ulnar digital nerve was at the level of the trifurcation.  Two branches were found and both were repaired with the 9-0 nylon suture in an interrupted fashion.  Good reapproximation of the nerve ends was obtained.  The wound was then closed with 4-0 nylon in a horizontal mattress fashion.  The wound had been extended proximally to aid in visualization of the tendon and ulnar digital nerve and artery.  The wounds were then dressed with sterile Xeroform, 4x4s, and wrapped with a Kerlix bandage.  A dorsal blocking splint was placed with the wrist flexed approximately 30 to 40 degrees. The thumb MP joint flexed and the IP extended.  This was wrapped with Kerlix and Ace bandage.  Tourniquet was deflated at 105 minutes. Fingertips were pink with brisk capillary refill after deflation of the tourniquet.  Operative drapes were broken down.  The patient was awakened from anesthesia safely.  He was transferred back to stretcher and taken to PACU in stable  condition.  I will see him back in the office in 1 week for postoperative followup.  I will give him Percocet 5/325 one to two p.o. q.6 hours p.r.n. pain, dispensed #30 and Bactrim DS 1 p.o. b.i.d. x7 days.     Betha LoaKevin Bladen Umar, MD     KK/MEDQ  D:  12/28/2016  T:  12/28/2016  Job:  [id number]

## 2016-12-28 NOTE — Anesthesia Preprocedure Evaluation (Signed)
Anesthesia Evaluation  Patient identified by MRN, date of birth, ID band Patient awake    Reviewed: Allergy & Precautions, NPO status , Patient's Chart, lab work & pertinent test results  Airway Mallampati: I  TM Distance: >3 FB Neck ROM: Full    Dental   Pulmonary Current Smoker,    Pulmonary exam normal        Cardiovascular hypertension, Pt. on medications Normal cardiovascular exam     Neuro/Psych    GI/Hepatic GERD  Medicated and Controlled,  Endo/Other  diabetes, Type 2, Oral Hypoglycemic Agents  Renal/GU      Musculoskeletal   Abdominal   Peds  Hematology   Anesthesia Other Findings   Reproductive/Obstetrics                             Anesthesia Physical Anesthesia Plan  ASA: II  Anesthesia Plan: Regional and MAC   Post-op Pain Management:    Induction: Intravenous  Airway Management Planned: Simple Face Mask  Additional Equipment:   Intra-op Plan:   Post-operative Plan:   Informed Consent: I have reviewed the patients History and Physical, chart, labs and discussed the procedure including the risks, benefits and alternatives for the proposed anesthesia with the patient or authorized representative who has indicated his/her understanding and acceptance.     Plan Discussed with: CRNA and Surgeon  Anesthesia Plan Comments:         Anesthesia Quick Evaluation

## 2016-12-28 NOTE — Op Note (Signed)
I assisted Surgeon(s) and Role:    * Betha LoaKuzma, Kevin, MD - Primary    Cindee Salt* Colburn Asper, MD on the Procedure(s): PERCUTANEOUS PINNING EXTREMITY IRRIGATION AND DEBRIDEMENT WOUND NERVE, TENDON AND ARTERY REPAIR on 12/28/2016.  I provided assistance on this case as follows: repair flexor tendon, repair artery and nerve using microscope, closure and application of the dressing and splints.  Electronically signed by: Nicki ReaperKUZMA,Paisely Brick R, MD Date: 12/28/2016 Time: 4:36 PM

## 2016-12-28 NOTE — Transfer of Care (Signed)
Immediate Anesthesia Transfer of Care Note  Patient: [name] [name]  Procedure(s) Performed: Procedure(s): PERCUTANEOUS PINNING EXTREMITY (Right) IRRIGATION AND DEBRIDEMENT WOUND (Right) NERVE, TENDON AND ARTERY REPAIR (Right)  Patient Location: PACU  Anesthesia Type:GA combined with regional for post-op pain  Level of Consciousness: sedated  Airway & Oxygen Therapy: Patient Spontanous Breathing and Patient connected to face mask oxygen  Post-op Assessment: Report given to RN and Post -op Vital signs reviewed and stable  Post vital signs: Reviewed and stable  Last Vitals:  Vitals:   12/28/16 1304 12/28/16 1305  BP:    Pulse: (!) 56 61  Resp: 14 15  Temp:      Last Pain:  Vitals:   12/28/16 1209  TempSrc: Oral  PainSc: 1       Patients Stated Pain Goal: 1 (12/28/16 1209)  Complications: No apparent anesthesia complications

## 2016-12-28 NOTE — H&P (Signed)
[name] [name] is an 61 y.o. male.   Chief Complaint: right thumb saw injury HPI: 62 yo rhd male states he injured right thumb on table saw last night.  Seen at [facility] where wound cleaned and sutured.  Followed up in office.  He wishes to undergo operative treatment for the fracture.  Allergies: No Known Allergies  Past Medical History:  Diagnosis Date  . Anemia   . Cryptogenic cirrhosis (HCC)   . Diabetes mellitus   . GERD (gastroesophageal reflux disease)   . Hypertension   . Liver disease     Past Surgical History:  Procedure Laterality Date  . COLONOSCOPY    . LIVER BIOPSY    . TIPS PROCEDURE  2008  . UPPER GASTROINTESTINAL ENDOSCOPY      Family History: Family History  Problem Relation Age of Onset  . Healthy Mother   . Diabetes Father   . Heart disease Father   . Healthy Sister   . Healthy Sister     Social History:   reports that he has been smoking Cigarettes.  He has a 40.00 pack-year smoking history. He has never used smokeless tobacco. He reports that he does not drink alcohol or use drugs.  Medications: Medications Prior to Admission  Medication Sig Dispense Refill  . carvedilol (COREG) 6.25 MG tablet Take 6.25 mg by mouth 2 (two) times daily with a meal.    . Cyanocobalamin (VITAMIN B-12 IJ) Inject as directed once a week. Currently on starter - for 6 weeks then monthly.Currently in 4 th week    . furosemide (LASIX) 40 MG tablet TAKE 1 TABLET BY MOUTH EVERYDAY OR EVERY OTHER DAY. 30 tablet 5  . glimepiride (AMARYL) 4 MG tablet Take 4 mg by mouth daily before breakfast.    . losartan (COZAAR) 100 MG tablet Take 100 mg by mouth daily.    Marland Kitchen omeprazole (PRILOSEC) 20 MG capsule TAKE 1 CAPSULE BY MOUTH EVERY MORNING FOR ACID REFLUX. 30 capsule 5  . ciprofloxacin (CIPRO) 750 MG tablet TAKE ONE TABLET BY MOUTH EVERY WEEK. 4 tablet 0  . oxyCODONE-acetaminophen (PERCOCET/ROXICET) 5-325 MG tablet Take 1 tablet by mouth every 4 (four) hours as needed for severe pain. 6  tablet 0  . sulfamethoxazole-trimethoprim (BACTRIM DS,SEPTRA DS) 800-160 MG tablet Take 1 tablet by mouth 2 (two) times daily. 20 tablet 0    Results for orders placed or performed during the hospital encounter of 12/28/16 (from the past 48 hour(s))  I-STAT, chem 8     Status: Abnormal   Collection Time: 12/28/16 12:47 PM  Result Value Ref Range   Sodium 142 135 - 145 mmol/L   Potassium 4.1 3.5 - 5.1 mmol/L   Chloride 109 101 - 111 mmol/L   BUN 15 6 - 20 mg/dL   Creatinine, Ser 6.96 0.61 - 1.24 mg/dL   Glucose, Bld 295 (H) 65 - 99 mg/dL   Calcium, Ion 2.84 1.32 - 1.40 mmol/L   TCO2 22 0 - 100 mmol/L   Hemoglobin 13.6 13.0 - 17.0 g/dL   HCT 44.0 10.2 - 72.5 %    Dg Finger Thumb Right  Result Date: 12/27/2016 CLINICAL DATA:  61 y/o  M; laceration to the right thumb. EXAM: RIGHT THUMB 2+V COMPARISON:  None. FINDINGS: Oblique mildly displaced acute fracture with comminuted fragments extending from lateral aspect of the first distal phalanx tuft to mid base with intra-articular extension. Extensive soft tissue abnormality. IMPRESSION: Oblique mildly displaced acute fracture with comminuted fragments extending from  lateral aspect of the first distal phalanx tuft to mid base with intra-articular extension. Electronically Signed   By: Mitzi HansenLance  Furusawa-Stratton M.D.   On: 12/27/2016 20:45     A comprehensive review of systems was negative.  Blood pressure (!) 171/69, pulse 61, temperature 98.1 F (36.7 C), temperature source Oral, resp. rate 15, height 5\' 10"  (1.778 m), weight 110.2 kg (243 lb), SpO2 100 %.  General appearance: alert, cooperative and appears stated age Head: Normocephalic, without obvious abnormality, atraumatic Neck: supple, symmetrical, trachea midline Resp: clear to auscultation bilaterally Cardio: regular rate and rhythm GI: non-tender Extremities: Intact sensation and capillary refill all digits.  +epl/fpl/io.  Laceration of distal phalanx including nail. Pulses: 2+  and symmetric Skin: Skin color, texture, turgor normal. No rashes or lesions Neurologic: Grossly normal Incision/Wound:as above  Assessment/Plan Right thumb table saw injury with open distal phalanx fracture.  Non operative and operative treatment options were discussed with the patient and patient wishes to proceed with operative treatment. Risks, benefits, and alternatives of surgery were discussed and the patient agrees with the plan of care.   Jarone Ostergaard R 12/28/2016, 1:12 PM

## 2016-12-28 NOTE — Discharge Instructions (Addendum)
Regional Anesthesia Blocks ° °1. Numbness or the inability to move the "blocked" extremity may last from 3-48 hours after placement. The length of time depends on the medication injected and your individual response to the medication. If the numbness is not going away after 48 hours, call your surgeon. ° °2. The extremity that is blocked will need to be protected until the numbness is gone and the  Strength has returned. Because you cannot feel it, you will need to take extra care to avoid injury. Because it may be weak, you may have difficulty moving it or using it. You may not know what position it is in without looking at it while the block is in effect. ° °3. For blocks in the legs and feet, returning to weight bearing and walking needs to be done carefully. You will need to wait until the numbness is entirely gone and the strength has returned. You should be able to move your leg and foot normally before you try and bear weight or walk. You will need someone to be with you when you first try to ensure you do not fall and possibly risk injury. ° °4. Bruising and tenderness at the needle site are common side effects and will resolve in a few days. ° °5. Persistent numbness or new problems with movement should be communicated to the surgeon or the [facility] ([phone])/ [facility] ([phone]). ° ° ° °Post Anesthesia Home Care Instructions ° °Activity: °Get plenty of rest for the remainder of the day. A responsible individual must stay with you for 24 hours following the procedure.  °For the next 24 hours, DO NOT: °-Drive a car °-Operate machinery °-Drink alcoholic beverages °-Take any medication unless instructed by your physician °-Make any legal decisions or sign important papers. ° °Meals: °Start with liquid foods such as gelatin or soup. Progress to regular foods as tolerated. Avoid greasy, spicy, heavy foods. If nausea and/or vomiting occur, drink only clear liquids until the  nausea and/or vomiting subsides. Call your physician if vomiting continues. ° °Special Instructions/Symptoms: °Your throat may feel dry or sore from the anesthesia or the breathing tube placed in your throat during surgery. If this causes discomfort, gargle with warm salt water. The discomfort should disappear within 24 hours. ° °If you had a scopolamine patch placed behind your ear for the management of post- operative nausea and/or vomiting: ° °1. The medication in the patch is effective for 72 hours, after which it should be removed.  Wrap patch in a tissue and discard in the trash. Wash hands thoroughly with soap and water. °2. You may remove the patch earlier than 72 hours if you experience unpleasant side effects which may include dry mouth, dizziness or visual disturbances. °3. Avoid touching the patch. Wash your hands with soap and water after contact with the patch. °  °[facility] Center Instructions °Hand Surgery ° °Wound Care: °Keep your hand elevated above the level of your heart.  Do not allow it to dangle by your side.  Keep the dressing dry and do not remove it unless your doctor advises you to do so.  He will usually change it at the time of your post-op visit.  Moving your fingers is advised to stimulate circulation but will depend on the site of your surgery.  If you have a splint applied, your doctor will advise you regarding movement. ° °Activity: °Do not drive or operate machinery today.  Rest today and then you may   return to your normal activity and work as indicated by your physician. ° °Diet:  °Drink liquids today or eat a light diet.  You may resume a regular diet tomorrow.   ° °General expectations: °Pain for two to three days. °Fingers may become slightly swollen. ° °Call your doctor if any of the following occur: °Severe pain not relieved by pain medication. °Elevated temperature. °Dressing soaked with blood. °Inability to move fingers. °White or bluish color to fingers. ° °

## 2016-12-28 NOTE — Brief Op Note (Signed)
12/28/2016  3:39 PM  PATIENT:  Cay SchillingsNorris M Brazel  61 y.o. male  PRE-OPERATIVE DIAGNOSIS:  INJURY NAIL BED RT THUMB  POST-OPERATIVE DIAGNOSIS:  INJURY NAIL BED RT THUMB LACERATION TENDON NERVE ARTERY THUMB  PROCEDURE:  Procedure(s): PERCUTANEOUS PINNING EXTREMITY (Right) IRRIGATION AND DEBRIDEMENT WOUND (Right) NERVE, TENDON AND ARTERY REPAIR (Right)  SURGEON:  Surgeon(s) and Role:    * Betha LoaKuzma, Gaynel Schaafsma, MD - Primary    * Cindee SaltKuzma, Gary, MD  PHYSICIAN ASSISTANT:   ASSISTANTS: Cindee SaltGary Marleta Lapierre, MD   ANESTHESIA:   regional and IV sedation  EBL:  Total I/O In: 300 [I.V.:300] Out: -   BLOOD ADMINISTERED:none  DRAINS: none   LOCAL MEDICATIONS USED:  NONE  SPECIMEN:  No Specimen  DISPOSITION OF SPECIMEN:  N/A  COUNTS:  YES  TOURNIQUET:   Total Tourniquet Time Documented: Upper Arm (Right) - 105 minutes Total: Upper Arm (Right) - 105 minutes   DICTATION: .Other Dictation: Dictation Number [id number]  PLAN OF CARE: Discharge to home after PACU  PATIENT DISPOSITION:  PACU - hemodynamically stable.

## 2016-12-28 NOTE — Anesthesia Postprocedure Evaluation (Signed)
Anesthesia Post Note  Patient: [name] [name]  Procedure(s) Performed: Procedure(s) (LRB): PERCUTANEOUS PINNING EXTREMITY (Right) IRRIGATION AND DEBRIDEMENT WOUND (Right) NERVE, TENDON AND ARTERY REPAIR (Right)  Patient location during evaluation: PACU Anesthesia Type: Regional Level of consciousness: awake and alert and patient cooperative Pain management: pain level controlled Vital Signs Assessment: post-procedure vital signs reviewed and stable Respiratory status: spontaneous breathing and respiratory function stable Cardiovascular status: stable Anesthetic complications: no       Last Vitals:  Vitals:   12/28/16 1600 12/28/16 1630  BP: (!) 141/67 (!) 165/81  Pulse: (!) 58 64  Resp: 17 16  Temp:  36.6 C    Last Pain:  Vitals:   12/28/16 1630  TempSrc:   PainSc: 0-No pain                 Astryd Pearcy DAVID

## 2016-12-28 NOTE — Op Note (Signed)
[id number] 

## 2016-12-28 NOTE — Progress Notes (Signed)
Assisted Dr. Ossey with right, ultrasound guided, supraclavicular block. Side rails up, monitors on throughout procedure. See vital signs in flow sheet. Tolerated Procedure well. 

## 2017-01-01 ENCOUNTER — Encounter (HOSPITAL_BASED_OUTPATIENT_CLINIC_OR_DEPARTMENT_OTHER): Payer: Self-pay | Admitting: Orthopedic Surgery

## 2017-01-04 MED FILL — Oxycodone w/ Acetaminophen Tab 5-325 MG: ORAL | Qty: 6 | Status: AC

## 2017-01-23 ENCOUNTER — Ambulatory Visit (INDEPENDENT_AMBULATORY_CARE_PROVIDER_SITE_OTHER): Payer: Medicaid Other | Admitting: Ophthalmology

## 2017-01-23 DIAGNOSIS — E10311 Type 1 diabetes mellitus with unspecified diabetic retinopathy with macular edema: Secondary | ICD-10-CM

## 2017-01-23 DIAGNOSIS — E113593 Type 2 diabetes mellitus with proliferative diabetic retinopathy without macular edema, bilateral: Secondary | ICD-10-CM | POA: Diagnosis not present

## 2017-01-23 DIAGNOSIS — H2513 Age-related nuclear cataract, bilateral: Secondary | ICD-10-CM | POA: Diagnosis not present

## 2017-01-23 DIAGNOSIS — I10 Essential (primary) hypertension: Secondary | ICD-10-CM | POA: Diagnosis not present

## 2017-01-23 DIAGNOSIS — H35033 Hypertensive retinopathy, bilateral: Secondary | ICD-10-CM | POA: Diagnosis not present

## 2017-01-23 DIAGNOSIS — H43813 Vitreous degeneration, bilateral: Secondary | ICD-10-CM

## 2017-05-23 ENCOUNTER — Other Ambulatory Visit (HOSPITAL_COMMUNITY): Payer: Self-pay | Admitting: Internal Medicine

## 2017-05-23 DIAGNOSIS — K7460 Unspecified cirrhosis of liver: Secondary | ICD-10-CM

## 2017-05-23 DIAGNOSIS — I739 Peripheral vascular disease, unspecified: Principal | ICD-10-CM

## 2017-05-23 DIAGNOSIS — I779 Disorder of arteries and arterioles, unspecified: Secondary | ICD-10-CM

## 2017-05-29 ENCOUNTER — Ambulatory Visit (HOSPITAL_COMMUNITY): Payer: Medicaid Other

## 2017-06-03 ENCOUNTER — Ambulatory Visit (HOSPITAL_COMMUNITY): Payer: Medicaid Other | Attending: Internal Medicine

## 2017-06-03 ENCOUNTER — Ambulatory Visit (HOSPITAL_COMMUNITY): Admission: RE | Admit: 2017-06-03 | Payer: Medicaid Other | Source: Ambulatory Visit

## 2017-06-17 ENCOUNTER — Ambulatory Visit (HOSPITAL_COMMUNITY)
Admission: RE | Admit: 2017-06-17 | Discharge: 2017-06-17 | Disposition: A | Discharge status: Home / Self Care | Payer: Medicaid Other | Source: Ambulatory Visit | Attending: Internal Medicine | Admitting: Internal Medicine

## 2017-06-17 DIAGNOSIS — I779 Disorder of arteries and arterioles, unspecified: Secondary | ICD-10-CM

## 2017-06-17 DIAGNOSIS — K7460 Unspecified cirrhosis of liver: Secondary | ICD-10-CM | POA: Insufficient documentation

## 2017-06-17 DIAGNOSIS — K8020 Calculus of gallbladder without cholecystitis without obstruction: Secondary | ICD-10-CM | POA: Insufficient documentation

## 2017-06-17 DIAGNOSIS — I6523 Occlusion and stenosis of bilateral carotid arteries: Secondary | ICD-10-CM | POA: Diagnosis not present

## 2017-06-17 DIAGNOSIS — I739 Peripheral vascular disease, unspecified: Principal | ICD-10-CM

## 2017-07-26 ENCOUNTER — Ambulatory Visit (INDEPENDENT_AMBULATORY_CARE_PROVIDER_SITE_OTHER): Payer: Medicaid Other | Admitting: Ophthalmology

## 2017-07-26 DIAGNOSIS — H2513 Age-related nuclear cataract, bilateral: Secondary | ICD-10-CM | POA: Diagnosis not present

## 2017-07-26 DIAGNOSIS — H35033 Hypertensive retinopathy, bilateral: Secondary | ICD-10-CM | POA: Diagnosis not present

## 2017-07-26 DIAGNOSIS — E11311 Type 2 diabetes mellitus with unspecified diabetic retinopathy with macular edema: Secondary | ICD-10-CM | POA: Diagnosis not present

## 2017-07-26 DIAGNOSIS — E113593 Type 2 diabetes mellitus with proliferative diabetic retinopathy without macular edema, bilateral: Secondary | ICD-10-CM

## 2017-07-26 DIAGNOSIS — H43813 Vitreous degeneration, bilateral: Secondary | ICD-10-CM | POA: Diagnosis not present

## 2017-07-26 DIAGNOSIS — I10 Essential (primary) hypertension: Secondary | ICD-10-CM | POA: Diagnosis not present

## 2017-09-05 DIAGNOSIS — E113593 Type 2 diabetes mellitus with proliferative diabetic retinopathy without macular edema, bilateral: Secondary | ICD-10-CM | POA: Diagnosis not present

## 2017-09-05 DIAGNOSIS — H35033 Hypertensive retinopathy, bilateral: Secondary | ICD-10-CM | POA: Diagnosis not present

## 2017-09-05 DIAGNOSIS — H25012 Cortical age-related cataract, left eye: Secondary | ICD-10-CM | POA: Diagnosis not present

## 2017-09-05 DIAGNOSIS — H2512 Age-related nuclear cataract, left eye: Secondary | ICD-10-CM | POA: Diagnosis not present

## 2017-09-05 DIAGNOSIS — H40013 Open angle with borderline findings, low risk, bilateral: Secondary | ICD-10-CM | POA: Diagnosis not present

## 2017-09-05 DIAGNOSIS — H25013 Cortical age-related cataract, bilateral: Secondary | ICD-10-CM | POA: Diagnosis not present

## 2017-09-05 DIAGNOSIS — H2513 Age-related nuclear cataract, bilateral: Secondary | ICD-10-CM | POA: Diagnosis not present

## 2017-10-08 DIAGNOSIS — H2511 Age-related nuclear cataract, right eye: Secondary | ICD-10-CM | POA: Diagnosis not present

## 2017-10-08 DIAGNOSIS — H25811 Combined forms of age-related cataract, right eye: Secondary | ICD-10-CM | POA: Diagnosis not present

## 2017-11-05 DIAGNOSIS — H25812 Combined forms of age-related cataract, left eye: Secondary | ICD-10-CM | POA: Diagnosis not present

## 2017-11-05 DIAGNOSIS — H2512 Age-related nuclear cataract, left eye: Secondary | ICD-10-CM | POA: Diagnosis not present

## 2018-01-23 ENCOUNTER — Encounter (INDEPENDENT_AMBULATORY_CARE_PROVIDER_SITE_OTHER): Payer: Medicaid Other | Admitting: Ophthalmology

## 2018-01-23 DIAGNOSIS — E113593 Type 2 diabetes mellitus with proliferative diabetic retinopathy without macular edema, bilateral: Secondary | ICD-10-CM

## 2018-01-23 DIAGNOSIS — H43813 Vitreous degeneration, bilateral: Secondary | ICD-10-CM | POA: Diagnosis not present

## 2018-01-23 DIAGNOSIS — I10 Essential (primary) hypertension: Secondary | ICD-10-CM

## 2018-01-23 DIAGNOSIS — E11319 Type 2 diabetes mellitus with unspecified diabetic retinopathy without macular edema: Secondary | ICD-10-CM | POA: Diagnosis not present

## 2018-01-23 DIAGNOSIS — H35033 Hypertensive retinopathy, bilateral: Secondary | ICD-10-CM

## 2018-01-24 ENCOUNTER — Encounter (INDEPENDENT_AMBULATORY_CARE_PROVIDER_SITE_OTHER): Payer: Medicaid Other | Admitting: Ophthalmology

## 2018-02-13 DIAGNOSIS — E538 Deficiency of other specified B group vitamins: Secondary | ICD-10-CM | POA: Diagnosis not present

## 2018-03-21 DIAGNOSIS — E538 Deficiency of other specified B group vitamins: Secondary | ICD-10-CM | POA: Diagnosis not present

## 2018-04-25 DIAGNOSIS — E538 Deficiency of other specified B group vitamins: Secondary | ICD-10-CM | POA: Diagnosis not present

## 2018-05-19 DIAGNOSIS — Z79899 Other long term (current) drug therapy: Secondary | ICD-10-CM | POA: Diagnosis not present

## 2018-05-19 DIAGNOSIS — E1139 Type 2 diabetes mellitus with other diabetic ophthalmic complication: Secondary | ICD-10-CM | POA: Diagnosis not present

## 2018-05-19 DIAGNOSIS — K7460 Unspecified cirrhosis of liver: Secondary | ICD-10-CM | POA: Diagnosis not present

## 2018-05-19 DIAGNOSIS — D696 Thrombocytopenia, unspecified: Secondary | ICD-10-CM | POA: Diagnosis not present

## 2018-05-26 DIAGNOSIS — Z23 Encounter for immunization: Secondary | ICD-10-CM | POA: Diagnosis not present

## 2018-05-26 DIAGNOSIS — E1139 Type 2 diabetes mellitus with other diabetic ophthalmic complication: Secondary | ICD-10-CM | POA: Diagnosis not present

## 2018-05-26 DIAGNOSIS — K7469 Other cirrhosis of liver: Secondary | ICD-10-CM | POA: Diagnosis not present

## 2018-05-26 DIAGNOSIS — D696 Thrombocytopenia, unspecified: Secondary | ICD-10-CM | POA: Diagnosis not present

## 2018-06-06 DIAGNOSIS — E538 Deficiency of other specified B group vitamins: Secondary | ICD-10-CM | POA: Diagnosis not present

## 2018-07-28 ENCOUNTER — Encounter (INDEPENDENT_AMBULATORY_CARE_PROVIDER_SITE_OTHER): Payer: Medicaid Other | Admitting: Ophthalmology

## 2018-08-05 ENCOUNTER — Other Ambulatory Visit: Payer: Self-pay | Admitting: Internal Medicine

## 2018-08-05 ENCOUNTER — Other Ambulatory Visit (HOSPITAL_COMMUNITY): Payer: Self-pay | Admitting: Internal Medicine

## 2018-08-05 DIAGNOSIS — I739 Peripheral vascular disease, unspecified: Principal | ICD-10-CM

## 2018-08-05 DIAGNOSIS — I779 Disorder of arteries and arterioles, unspecified: Secondary | ICD-10-CM

## 2018-08-05 DIAGNOSIS — K7460 Unspecified cirrhosis of liver: Secondary | ICD-10-CM

## 2018-08-11 ENCOUNTER — Encounter (INDEPENDENT_AMBULATORY_CARE_PROVIDER_SITE_OTHER): Payer: Medicaid Other | Admitting: Ophthalmology

## 2018-08-11 DIAGNOSIS — H43813 Vitreous degeneration, bilateral: Secondary | ICD-10-CM

## 2018-08-11 DIAGNOSIS — I10 Essential (primary) hypertension: Secondary | ICD-10-CM | POA: Diagnosis not present

## 2018-08-11 DIAGNOSIS — E113593 Type 2 diabetes mellitus with proliferative diabetic retinopathy without macular edema, bilateral: Secondary | ICD-10-CM | POA: Diagnosis not present

## 2018-08-11 DIAGNOSIS — H35033 Hypertensive retinopathy, bilateral: Secondary | ICD-10-CM | POA: Diagnosis not present

## 2018-08-11 DIAGNOSIS — E11319 Type 2 diabetes mellitus with unspecified diabetic retinopathy without macular edema: Secondary | ICD-10-CM | POA: Diagnosis not present

## 2018-08-13 ENCOUNTER — Ambulatory Visit (HOSPITAL_COMMUNITY)
Admission: RE | Admit: 2018-08-13 | Discharge: 2018-08-13 | Disposition: A | Discharge status: Home / Self Care | Payer: Medicaid Other | Source: Ambulatory Visit | Attending: Internal Medicine | Admitting: Internal Medicine

## 2018-08-13 DIAGNOSIS — K7460 Unspecified cirrhosis of liver: Secondary | ICD-10-CM

## 2018-08-13 DIAGNOSIS — I739 Peripheral vascular disease, unspecified: Secondary | ICD-10-CM | POA: Insufficient documentation

## 2018-08-13 DIAGNOSIS — K8020 Calculus of gallbladder without cholecystitis without obstruction: Secondary | ICD-10-CM | POA: Diagnosis not present

## 2018-08-13 DIAGNOSIS — I6523 Occlusion and stenosis of bilateral carotid arteries: Secondary | ICD-10-CM | POA: Diagnosis not present

## 2018-08-13 DIAGNOSIS — I779 Disorder of arteries and arterioles, unspecified: Secondary | ICD-10-CM

## 2018-08-20 DIAGNOSIS — D696 Thrombocytopenia, unspecified: Secondary | ICD-10-CM | POA: Diagnosis not present

## 2018-08-20 DIAGNOSIS — Z79899 Other long term (current) drug therapy: Secondary | ICD-10-CM | POA: Diagnosis not present

## 2018-08-20 DIAGNOSIS — K7460 Unspecified cirrhosis of liver: Secondary | ICD-10-CM | POA: Diagnosis not present

## 2018-08-20 DIAGNOSIS — E1139 Type 2 diabetes mellitus with other diabetic ophthalmic complication: Secondary | ICD-10-CM | POA: Diagnosis not present

## 2018-08-27 DIAGNOSIS — I700 Atherosclerosis of aorta: Secondary | ICD-10-CM | POA: Diagnosis not present

## 2018-08-27 DIAGNOSIS — E1140 Type 2 diabetes mellitus with diabetic neuropathy, unspecified: Secondary | ICD-10-CM | POA: Diagnosis not present

## 2018-08-27 DIAGNOSIS — D696 Thrombocytopenia, unspecified: Secondary | ICD-10-CM | POA: Diagnosis not present

## 2018-08-27 DIAGNOSIS — K7460 Unspecified cirrhosis of liver: Secondary | ICD-10-CM | POA: Diagnosis not present

## 2018-08-27 DIAGNOSIS — N183 Chronic kidney disease, stage 3 (moderate): Secondary | ICD-10-CM | POA: Diagnosis not present

## 2018-08-27 DIAGNOSIS — E538 Deficiency of other specified B group vitamins: Secondary | ICD-10-CM | POA: Diagnosis not present

## 2018-08-27 DIAGNOSIS — Z6835 Body mass index (BMI) 35.0-35.9, adult: Secondary | ICD-10-CM | POA: Diagnosis not present

## 2018-09-24 ENCOUNTER — Encounter (HOSPITAL_COMMUNITY): Payer: Self-pay | Admitting: Emergency Medicine

## 2018-09-24 ENCOUNTER — Inpatient Hospital Stay (HOSPITAL_COMMUNITY)
Admission: EM | Admit: 2018-09-24 | Discharge: 2018-10-02 | DRG: 444 | Disposition: A | Discharge status: Home / Self Care | Payer: Medicaid Other | Attending: Internal Medicine | Admitting: Internal Medicine

## 2018-09-24 ENCOUNTER — Other Ambulatory Visit: Payer: Self-pay

## 2018-09-24 ENCOUNTER — Emergency Department (HOSPITAL_COMMUNITY): Payer: Medicaid Other

## 2018-09-24 DIAGNOSIS — F22 Delusional disorders: Secondary | ICD-10-CM | POA: Diagnosis not present

## 2018-09-24 DIAGNOSIS — Z8249 Family history of ischemic heart disease and other diseases of the circulatory system: Secondary | ICD-10-CM

## 2018-09-24 DIAGNOSIS — Z79899 Other long term (current) drug therapy: Secondary | ICD-10-CM

## 2018-09-24 DIAGNOSIS — E860 Dehydration: Secondary | ICD-10-CM | POA: Diagnosis present

## 2018-09-24 DIAGNOSIS — F17210 Nicotine dependence, cigarettes, uncomplicated: Secondary | ICD-10-CM | POA: Diagnosis present

## 2018-09-24 DIAGNOSIS — N183 Chronic kidney disease, stage 3 (moderate): Secondary | ICD-10-CM | POA: Diagnosis present

## 2018-09-24 DIAGNOSIS — K831 Obstruction of bile duct: Secondary | ICD-10-CM | POA: Diagnosis present

## 2018-09-24 DIAGNOSIS — K7469 Other cirrhosis of liver: Secondary | ICD-10-CM | POA: Diagnosis present

## 2018-09-24 DIAGNOSIS — E1122 Type 2 diabetes mellitus with diabetic chronic kidney disease: Secondary | ICD-10-CM | POA: Diagnosis present

## 2018-09-24 DIAGNOSIS — I10 Essential (primary) hypertension: Secondary | ICD-10-CM | POA: Diagnosis present

## 2018-09-24 DIAGNOSIS — D696 Thrombocytopenia, unspecified: Secondary | ICD-10-CM | POA: Diagnosis present

## 2018-09-24 DIAGNOSIS — I129 Hypertensive chronic kidney disease with stage 1 through stage 4 chronic kidney disease, or unspecified chronic kidney disease: Secondary | ICD-10-CM | POA: Diagnosis present

## 2018-09-24 DIAGNOSIS — N179 Acute kidney failure, unspecified: Secondary | ICD-10-CM | POA: Diagnosis present

## 2018-09-24 DIAGNOSIS — R1013 Epigastric pain: Secondary | ICD-10-CM | POA: Diagnosis not present

## 2018-09-24 DIAGNOSIS — K8001 Calculus of gallbladder with acute cholecystitis with obstruction: Principal | ICD-10-CM | POA: Diagnosis present

## 2018-09-24 DIAGNOSIS — G92 Toxic encephalopathy: Secondary | ICD-10-CM | POA: Diagnosis not present

## 2018-09-24 DIAGNOSIS — Z7984 Long term (current) use of oral hypoglycemic drugs: Secondary | ICD-10-CM

## 2018-09-24 DIAGNOSIS — K819 Cholecystitis, unspecified: Secondary | ICD-10-CM | POA: Diagnosis not present

## 2018-09-24 DIAGNOSIS — K750 Abscess of liver: Secondary | ICD-10-CM | POA: Diagnosis present

## 2018-09-24 DIAGNOSIS — D6489 Other specified anemias: Secondary | ICD-10-CM | POA: Diagnosis present

## 2018-09-24 DIAGNOSIS — K219 Gastro-esophageal reflux disease without esophagitis: Secondary | ICD-10-CM | POA: Diagnosis present

## 2018-09-24 DIAGNOSIS — R161 Splenomegaly, not elsewhere classified: Secondary | ICD-10-CM | POA: Diagnosis present

## 2018-09-24 DIAGNOSIS — Z720 Tobacco use: Secondary | ICD-10-CM | POA: Diagnosis present

## 2018-09-24 DIAGNOSIS — K810 Acute cholecystitis: Secondary | ICD-10-CM | POA: Diagnosis present

## 2018-09-24 DIAGNOSIS — K8000 Calculus of gallbladder with acute cholecystitis without obstruction: Secondary | ICD-10-CM

## 2018-09-24 DIAGNOSIS — R188 Other ascites: Secondary | ICD-10-CM | POA: Diagnosis present

## 2018-09-24 DIAGNOSIS — F419 Anxiety disorder, unspecified: Secondary | ICD-10-CM | POA: Diagnosis present

## 2018-09-24 DIAGNOSIS — K661 Hemoperitoneum: Secondary | ICD-10-CM | POA: Diagnosis present

## 2018-09-24 DIAGNOSIS — M25511 Pain in right shoulder: Secondary | ICD-10-CM | POA: Diagnosis not present

## 2018-09-24 DIAGNOSIS — E1165 Type 2 diabetes mellitus with hyperglycemia: Secondary | ICD-10-CM | POA: Diagnosis present

## 2018-09-24 MED ORDER — LACTATED RINGERS IV SOLN
INTRAVENOUS | Status: DC
  Administered 2018-09-24 – 2018-09-29 (×7): via INTRAVENOUS

## 2018-09-24 MED ORDER — ONDANSETRON HCL 4 MG/2ML IJ SOLN
4.0000 mg | Freq: Once | INTRAMUSCULAR | Status: AC
  Administered 2018-09-24: 4 mg via INTRAVENOUS
  Filled 2018-09-24: qty 2

## 2018-09-24 MED ORDER — FENTANYL CITRATE (PF) 100 MCG/2ML IJ SOLN
50.0000 ug | Freq: Once | INTRAMUSCULAR | Status: AC
  Administered 2018-09-24: 50 ug via INTRAVENOUS
  Filled 2018-09-24: qty 2

## 2018-09-24 NOTE — ED Triage Notes (Signed)
Pt reports abd pain for one month intermittently. Nausea and vomiting with diarrhea since 8pm.

## 2018-09-24 NOTE — ED Provider Notes (Signed)
Emergency Department Provider Note   I have reviewed the triage vital signs and the nursing notes.   HISTORY  Chief Complaint Abdominal Pain   HPI [name] [name] is a 63 y.o. male with medical problems documented below who presents the emergency department today with excruciating abdominal pain.  Patient states that he has been having this pain off and on for the last month or 2.  It feels like bloating and sharp constant pain in his epigastric area that spreads to the right upper quadrant and subsequently to his whole abdomen.  Patient states that does not seem to be related to meals but he does have 6 known gallstones.  Does not drink alcohol is never any problems his pancreas that he knows of.  He has a history of cirrhosis of unclear etiology.  Not had any fevers during this time.  When he gets this pain he will have some nonbloody nonbilious vomiting and watery diarrhea.  No cough.  No other associated symptoms.  Tonight the pain was much worse than previously and did not go away so came to the emergency room for evaluation.  No sick contacts or recent travels.  No suspicious food intake.  Normal urine output.  No skin color changes. No other associated or modifying symptoms.    Past Medical History:  Diagnosis Date  . Anemia   . Cryptogenic cirrhosis (HCC)   . Diabetes mellitus   . GERD (gastroesophageal reflux disease)   . Hypertension   . Liver disease     Patient Active Problem List   Diagnosis Date Noted  . Acute cholecystitis 09/25/2018  . Cryptogenic cirrhosis (HCC) 10/23/2011  . DM (diabetes mellitus) (HCC) 10/23/2011  . GERD (gastroesophageal reflux disease) 10/23/2011  . 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 COLONOSCOPY    . INCISION AND DRAINAGE OF WOUND Right 12/28/2016   Procedure: IRRIGATION AND DEBRIDEMENT WOUND;  Surgeon: Betha Loa, MD;  Location: [facility];  Service: Orthopedics;  Laterality: Right;  . LIVER BIOPSY    . NERVE, TENDON AND ARTERY REPAIR Right 12/28/2016   Procedure: NERVE, TENDON AND ARTERY REPAIR;  Surgeon: Betha Loa, MD;  Location: [facility];  Service: Orthopedics;  Laterality: Right;  . PERCUTANEOUS PINNING Right 12/28/2016   Procedure: PERCUTANEOUS PINNING EXTREMITY;  Surgeon: Betha Loa, MD;  Location: [facility];  Service: Orthopedics;  Laterality: Right;  . TIPS PROCEDURE  2008  . UPPER GASTROINTESTINAL ENDOSCOPY      Current Outpatient Rx  . Order #: [id number] Class: Historical Med  . Order #: [id number] Class: Normal  . Order #: [id number] Class: Historical Med  . Order #: [id number] Class: Normal  . Order #: [id number] Class: Historical Med  . Order #: [id number] Class: Historical Med  . Order #: [id number] Class: Normal  . Order #: [id number] Class: Print  . Order #: [id number] Class: Print    Allergies Patient has no known allergies.  Family History  Problem Relation Age of Onset  . Healthy Mother   . Diabetes Father   . Heart disease Father   . Healthy Sister   . Healthy Sister     Social History Social History   Tobacco Use  . Smoking status: Current Every Day Smoker    Packs/day: 1.00    Years: 40.00    Pack years: 40.00    Types: Cigarettes  . Smokeless tobacco: Never Used  . Tobacco comment: patient states that  in the 40 years he has stopped smoking then start again  Substance Use Topics  . Alcohol use: No    Alcohol/week: 0.0 standard drinks  . Drug use: No    Review of Systems  All other systems negative except as documented in the HPI. All pertinent positives and negatives as reviewed in the HPI. ____________________________________________   PHYSICAL EXAM:  VITAL SIGNS: ED Triage Vitals  Enc Vitals Group     BP 09/24/18 2232 (!) 159/38     Pulse Rate 09/24/18 2232 71     Resp 09/24/18 2232 18     Temp 09/24/18 2232 97.8 F (36.6 C)      Temp Source 09/24/18 2232 Oral     SpO2 09/24/18 2232 95 %     Weight 09/24/18 2233 240 lb (108.9 kg)     Height 09/24/18 2233 5\' 11"  (1.803 m)     Head Circumference --      Peak Flow --      Pain Score 09/24/18 2232 9     Pain Loc --      Pain Edu? --      Excl. in GC? --     Constitutional: Alert and oriented. Well appearing and in no acute distress. Eyes: Conjunctivae are normal. PERRL. EOMI. Head: Atraumatic. Nose: No congestion/rhinnorhea. Mouth/Throat: Mucous membranes are moist.  Oropharynx non-erythematous. Neck: No stridor.  No meningeal signs.   Cardiovascular: Normal rate, regular rhythm. Good peripheral circulation. Grossly normal heart sounds.   Respiratory: Normal respiratory effort.  No retractions. Lungs CTAB. Gastrointestinal: Soft and exquisitely tender to epigastric area and less so to periumbilical area. No distention.  Musculoskeletal: No lower extremity tenderness nor edema. No gross deformities of extremities. Neurologic:  Normal speech and language. No gross focal neurologic deficits are appreciated.  Skin:  Skin is warm, dry and intact. No rash noted.   ____________________________________________   LABS (all labs ordered are listed, but only abnormal results are displayed)  Labs Reviewed  COMPREHENSIVE METABOLIC PANEL - Abnormal; Notable for the following components:      Result Value   CO2 21 (*)    Glucose, Bld 240 (*)    Creatinine, Ser 1.26 (*)    Calcium 8.6 (*)    Albumin 3.3 (*)    Alkaline Phosphatase 132 (*)    Total Bilirubin 3.1 (*)    All other components within normal limits  CBC WITH DIFFERENTIAL/PLATELET - Abnormal; Notable for the following components:   WBC 11.6 (*)    Platelets 106 (*)    Neutro Abs 9.8 (*)    All other components within normal limits  LIPASE, BLOOD  URINALYSIS, ROUTINE W REFLEX MICROSCOPIC   ____________________________________________  EKG   EKG Interpretation  Date/Time:  Wednesday September 24 2018 23:38:28 EST Ventricular Rate:  73 PR Interval:    QRS Duration: 92 QT Interval:  418 QTC Calculation: 461 R Axis:   62 Text Interpretation:  Sinus rhythm Minimal ST depression, diffuse leads likely related to baseline wander No acute changes comp ared to may 2018 Confirmed by Marily Memos [phone]) on 09/25/2018 12:06:57 AM       ____________________________________________  RADIOLOGY  Ct Abdomen Pelvis W Contrast  Result Date: 09/25/2018 CLINICAL DATA:  Upper abdominal pain with nausea and vomiting EXAM: CT ABDOMEN AND PELVIS WITH CONTRAST TECHNIQUE: Multidetector CT imaging of the abdomen and pelvis was performed using the standard protocol following bolus administration of intravenous contrast. CONTRAST:  OMNIPAQUE IOHEXOL 300 MG/ML  SOLN COMPARISON:  CT 01/01/2006 FINDINGS: Lower chest: Lung bases demonstrate no acute consolidation or effusion. The heart size is within normal limits. Small hiatal hernia Hepatobiliary: Cirrhotic morphology of the liver. Tips shunt is in place. No focal hepatic abnormality or biliary dilatation. Abnormal appearance of gallbladder with high density intraluminal material and hazy edema around the gallbladder lumen. Stone at the gallbladder neck. High density fluid along the inferior hepatic margin suspicious for hemoperitoneum. Rounded calcification in the fluid, series 2, image number 27, possibly a gallstone. Pancreas: Unremarkable. No pancreatic ductal dilatation or surrounding inflammatory changes. Spleen: Normal in size without focal abnormality. Adrenals/Urinary Tract: Adrenal glands are unremarkable. Kidneys are normal, without renal calculi, focal lesion, or hydronephrosis. Bladder is unremarkable. Stomach/Bowel: Stomach is within normal limits. Appendix appears normal. No evidence of bowel wall thickening, distention, or inflammatory changes. Vascular/Lymphatic: Nonaneurysmal aorta. Mild aortic atherosclerosis. No significantly enlarged lymph  nodes Reproductive: Slightly enlarged prostate with calcification Other: No free air. Small slightly hyperdense fluid within the pelvis with more moderate fluid in the right upper quadrant adjacent to the liver. Fat containing inguinal hernias. Musculoskeletal: Advanced degenerative changes of the spine. No acute osseous abnormality. IMPRESSION: 1. Abnormal appearance of the gallbladder which contains intraluminal high density material. There is hazy edema and or fluid around the gallbladder with a stone present in the gallbladder neck. Findings could be secondary to an acute cholecystitis. Additional finding of hemorrhagic fluid along the inferior margin of the liver with rounded calcifications in the hemorrhagic fluid, question gallbladder perforation with extravasated stones. Associated hemoperitoneum. 2. Cirrhosis of the liver. 3. Small amount of hemorrhagic ascites within the pelvis. Electronically Signed   By: Jasmine PangKim  Fujinaga M.D.   On: 09/25/2018 02:02    ____________________________________________   PROCEDURES  Procedure(s) performed:   Procedures   ____________________________________________   INITIAL IMPRESSION / ASSESSMENT AND PLAN / ED COURSE  Overall impression is for likely biliary colic.  Will treat pain and nausea.  Also consider peptic ulcer disease versus pancreatitis as possible causes.  His abdomen is pretty tender in the epigastric area but does not seem to have peritonitis at this point.  I will continue to reevaluate after interventions and results.  CT scan with evidence of cholecystitis and likely gallbladder rupture.  Discussed with Dr. Lovell SheehanJenkins to ensure this was not an emergent surgical issue and the patient has normal vital signs minimally elevated white blood cell count and does not have a peritonitic abdomen so we will start antibiotics admit to medicine and surgery will see in the morning.     Pertinent labs & imaging results that were available during my care  of the patient were reviewed by me and considered in my medical decision making (see chart for details).  ____________________________________________  FINAL CLINICAL IMPRESSION(S) / ED DIAGNOSES  Final diagnoses:  Cholecystitis     MEDICATIONS GIVEN DURING THIS VISIT:  Medications  lactated ringers infusion ( Intravenous New Bag/Given 09/24/18 2349)  piperacillin-tazobactam (ZOSYN) IVPB 3.375 g (3.375 g Intravenous New Bag/Given 09/25/18 0219)  fentaNYL (SUBLIMAZE) injection 50 mcg (50 mcg Intravenous Given 09/24/18 2348)  ondansetron (ZOFRAN) injection 4 mg (4 mg Intravenous Given 09/24/18 2348)  iohexol (OMNIPAQUE) 300 MG/ML solution 100 mL (100 mLs Intravenous Contrast Given 09/25/18 0056)  HYDROmorphone (DILAUDID) injection 1 mg (1 mg Intravenous Given 09/25/18 0132)     NEW OUTPATIENT MEDICATIONS STARTED DURING THIS VISIT:  New Prescriptions   No medications on file    Note:  This note was prepared with  assistance of Conservation officer, historic buildingsDragon voice recognition software. Occasional wrong-word or sound-a-like substitutions may have occurred due to the inherent limitations of voice recognition software.   Eben Choinski, Barbara CowerJason, MD 09/25/18 [phone]

## 2018-09-25 ENCOUNTER — Other Ambulatory Visit: Payer: Self-pay

## 2018-09-25 ENCOUNTER — Encounter (HOSPITAL_COMMUNITY): Payer: Self-pay | Admitting: Internal Medicine

## 2018-09-25 ENCOUNTER — Inpatient Hospital Stay (HOSPITAL_COMMUNITY): Payer: Medicaid Other

## 2018-09-25 DIAGNOSIS — Z720 Tobacco use: Secondary | ICD-10-CM | POA: Diagnosis present

## 2018-09-25 DIAGNOSIS — K651 Peritoneal abscess: Secondary | ICD-10-CM | POA: Diagnosis not present

## 2018-09-25 DIAGNOSIS — K7469 Other cirrhosis of liver: Secondary | ICD-10-CM | POA: Diagnosis present

## 2018-09-25 DIAGNOSIS — F419 Anxiety disorder, unspecified: Secondary | ICD-10-CM | POA: Diagnosis present

## 2018-09-25 DIAGNOSIS — K831 Obstruction of bile duct: Secondary | ICD-10-CM | POA: Diagnosis present

## 2018-09-25 DIAGNOSIS — Z8249 Family history of ischemic heart disease and other diseases of the circulatory system: Secondary | ICD-10-CM | POA: Diagnosis not present

## 2018-09-25 DIAGNOSIS — K82A2 Perforation of gallbladder in cholecystitis: Secondary | ICD-10-CM | POA: Diagnosis not present

## 2018-09-25 DIAGNOSIS — K710 Toxic liver disease with cholestasis: Secondary | ICD-10-CM | POA: Diagnosis not present

## 2018-09-25 DIAGNOSIS — K810 Acute cholecystitis: Secondary | ICD-10-CM | POA: Diagnosis present

## 2018-09-25 DIAGNOSIS — E1122 Type 2 diabetes mellitus with diabetic chronic kidney disease: Secondary | ICD-10-CM | POA: Diagnosis present

## 2018-09-25 DIAGNOSIS — I10 Essential (primary) hypertension: Secondary | ICD-10-CM | POA: Diagnosis not present

## 2018-09-25 DIAGNOSIS — D631 Anemia in chronic kidney disease: Secondary | ICD-10-CM | POA: Diagnosis not present

## 2018-09-25 DIAGNOSIS — Z79899 Other long term (current) drug therapy: Secondary | ICD-10-CM | POA: Diagnosis not present

## 2018-09-25 DIAGNOSIS — K8001 Calculus of gallbladder with acute cholecystitis with obstruction: Secondary | ICD-10-CM | POA: Diagnosis not present

## 2018-09-25 DIAGNOSIS — F17210 Nicotine dependence, cigarettes, uncomplicated: Secondary | ICD-10-CM | POA: Diagnosis present

## 2018-09-25 DIAGNOSIS — K661 Hemoperitoneum: Secondary | ICD-10-CM | POA: Diagnosis present

## 2018-09-25 DIAGNOSIS — K822 Perforation of gallbladder: Secondary | ICD-10-CM | POA: Diagnosis not present

## 2018-09-25 DIAGNOSIS — D696 Thrombocytopenia, unspecified: Secondary | ICD-10-CM | POA: Diagnosis present

## 2018-09-25 DIAGNOSIS — N183 Chronic kidney disease, stage 3 (moderate): Secondary | ICD-10-CM | POA: Diagnosis present

## 2018-09-25 DIAGNOSIS — K219 Gastro-esophageal reflux disease without esophagitis: Secondary | ICD-10-CM | POA: Diagnosis present

## 2018-09-25 DIAGNOSIS — R17 Unspecified jaundice: Secondary | ICD-10-CM | POA: Diagnosis not present

## 2018-09-25 DIAGNOSIS — K819 Cholecystitis, unspecified: Secondary | ICD-10-CM | POA: Diagnosis not present

## 2018-09-25 DIAGNOSIS — N179 Acute kidney failure, unspecified: Secondary | ICD-10-CM | POA: Diagnosis present

## 2018-09-25 DIAGNOSIS — K750 Abscess of liver: Secondary | ICD-10-CM | POA: Diagnosis present

## 2018-09-25 DIAGNOSIS — I129 Hypertensive chronic kidney disease with stage 1 through stage 4 chronic kidney disease, or unspecified chronic kidney disease: Secondary | ICD-10-CM | POA: Diagnosis present

## 2018-09-25 DIAGNOSIS — R161 Splenomegaly, not elsewhere classified: Secondary | ICD-10-CM | POA: Diagnosis present

## 2018-09-25 DIAGNOSIS — G92 Toxic encephalopathy: Secondary | ICD-10-CM | POA: Diagnosis not present

## 2018-09-25 DIAGNOSIS — K760 Fatty (change of) liver, not elsewhere classified: Secondary | ICD-10-CM | POA: Diagnosis not present

## 2018-09-25 DIAGNOSIS — K7460 Unspecified cirrhosis of liver: Secondary | ICD-10-CM | POA: Diagnosis not present

## 2018-09-25 DIAGNOSIS — R1013 Epigastric pain: Secondary | ICD-10-CM | POA: Diagnosis not present

## 2018-09-25 DIAGNOSIS — E1165 Type 2 diabetes mellitus with hyperglycemia: Secondary | ICD-10-CM | POA: Diagnosis present

## 2018-09-25 DIAGNOSIS — Z7984 Long term (current) use of oral hypoglycemic drugs: Secondary | ICD-10-CM | POA: Diagnosis not present

## 2018-09-25 DIAGNOSIS — R188 Other ascites: Secondary | ICD-10-CM | POA: Diagnosis present

## 2018-09-25 DIAGNOSIS — E860 Dehydration: Secondary | ICD-10-CM | POA: Diagnosis present

## 2018-09-25 DIAGNOSIS — M25511 Pain in right shoulder: Secondary | ICD-10-CM | POA: Diagnosis not present

## 2018-09-25 DIAGNOSIS — N200 Calculus of kidney: Secondary | ICD-10-CM | POA: Diagnosis not present

## 2018-09-25 DIAGNOSIS — D6489 Other specified anemias: Secondary | ICD-10-CM | POA: Diagnosis present

## 2018-09-25 LAB — CBC WITH DIFFERENTIAL/PLATELET
Abs Immature Granulocytes: 0.07 10*3/uL (ref 0.00–0.07)
BASOS PCT: 0 %
Basophils Absolute: 0 10*3/uL (ref 0.0–0.1)
Basophils Absolute: 0 10*3/uL (ref 0.0–0.1)
Basophils Relative: 0 %
EOS ABS: 0.1 10*3/uL (ref 0.0–0.5)
Eosinophils Absolute: 0.1 10*3/uL (ref 0.0–0.5)
Eosinophils Relative: 1 %
Eosinophils Relative: 1 %
HCT: 36.8 % — ABNORMAL LOW (ref 39.0–52.0)
HEMATOCRIT: 40.2 % (ref 39.0–52.0)
HEMOGLOBIN: 13.2 g/dL (ref 13.0–17.0)
Hemoglobin: 12.1 g/dL — ABNORMAL LOW (ref 13.0–17.0)
Immature Granulocytes: 1 %
LYMPHS ABS: 0.7 10*3/uL (ref 0.7–4.0)
Lymphocytes Relative: 6 %
Lymphocytes Relative: 9 %
Lymphs Abs: 1 10*3/uL (ref 0.7–4.0)
MCH: 30.5 pg (ref 26.0–34.0)
MCH: 31 pg (ref 26.0–34.0)
MCHC: 32.9 g/dL (ref 30.0–36.0)
MCHC: 32.8 g/dL (ref 30.0–36.0)
MCV: 92.8 fL (ref 80.0–100.0)
MCV: 94.4 fL (ref 80.0–100.0)
MONOS PCT: 8 %
Monocytes Absolute: 1 10*3/uL (ref 0.1–1.0)
Monocytes Absolute: 1 10*3/uL (ref 0.1–1.0)
Monocytes Relative: 8 %
NEUTROS ABS: 9.8 10*3/uL — AB (ref 1.7–7.7)
NEUTROS PCT: 84 %
Neutro Abs: 9.4 10*3/uL — ABNORMAL HIGH (ref 1.7–7.7)
Neutrophils Relative %: 81 %
Platelets: 106 10*3/uL — ABNORMAL LOW (ref 150–400)
Platelets: 97 10*3/uL — ABNORMAL LOW (ref 150–400)
RBC: 3.9 MIL/uL — ABNORMAL LOW (ref 4.22–5.81)
RBC: 4.33 MIL/uL (ref 4.22–5.81)
RDW: 13.5 % (ref 11.5–15.5)
RDW: 13.9 % (ref 11.5–15.5)
WBC: 11.6 10*3/uL — ABNORMAL HIGH (ref 4.0–10.5)
WBC: 11.6 10*3/uL — ABNORMAL HIGH (ref 4.0–10.5)
nRBC: 0 % (ref 0.0–0.2)
nRBC: 0.8 % — ABNORMAL HIGH (ref 0.0–0.2)

## 2018-09-25 LAB — COMPREHENSIVE METABOLIC PANEL
ALBUMIN: 3.3 g/dL — AB (ref 3.5–5.0)
ALT: 37 U/L (ref 0–44)
ALT: 40 U/L (ref 0–44)
AST: 35 U/L (ref 15–41)
AST: 35 U/L (ref 15–41)
Albumin: 3.1 g/dL — ABNORMAL LOW (ref 3.5–5.0)
Alkaline Phosphatase: 109 U/L (ref 38–126)
Alkaline Phosphatase: 132 U/L — ABNORMAL HIGH (ref 38–126)
Anion gap: 8 (ref 5–15)
Anion gap: 8 (ref 5–15)
BUN: 21 mg/dL (ref 8–23)
BUN: 18 mg/dL (ref 8–23)
CHLORIDE: 108 mmol/L (ref 98–111)
CO2: 21 mmol/L — AB (ref 22–32)
CO2: 21 mmol/L — AB (ref 22–32)
Calcium: 8.4 mg/dL — ABNORMAL LOW (ref 8.9–10.3)
Calcium: 8.6 mg/dL — ABNORMAL LOW (ref 8.9–10.3)
Chloride: 109 mmol/L (ref 98–111)
Creatinine, Ser: 1.26 mg/dL — ABNORMAL HIGH (ref 0.61–1.24)
Creatinine, Ser: 1.44 mg/dL — ABNORMAL HIGH (ref 0.61–1.24)
GFR calc Af Amer: 60 mL/min — ABNORMAL LOW (ref >60)
GFR calc Af Amer: >60 mL/min (ref >60)
GFR calc non Af Amer: 52 mL/min — ABNORMAL LOW (ref >60)
GFR calc non Af Amer: >60 mL/min (ref >60)
GLUCOSE: 240 mg/dL — AB (ref 70–99)
Glucose, Bld: 248 mg/dL — ABNORMAL HIGH (ref 70–99)
Potassium: 4.8 mmol/L (ref 3.5–5.1)
Potassium: 4.9 mmol/L (ref 3.5–5.1)
SODIUM: 137 mmol/L (ref 135–145)
Sodium: 138 mmol/L (ref 135–145)
Total Bilirubin: 2.6 mg/dL — ABNORMAL HIGH (ref 0.3–1.2)
Total Bilirubin: 3.1 mg/dL — ABNORMAL HIGH (ref 0.3–1.2)
Total Protein: 6.4 g/dL — ABNORMAL LOW (ref 6.5–8.1)
Total Protein: 6.9 g/dL (ref 6.5–8.1)

## 2018-09-25 LAB — URINALYSIS, ROUTINE W REFLEX MICROSCOPIC
BACTERIA UA: NONE SEEN
BILIRUBIN URINE: NEGATIVE
Glucose, UA: >=500 mg/dL — AB
Hgb urine dipstick: NEGATIVE
KETONES UR: NEGATIVE mg/dL
Leukocytes,Ua: NEGATIVE
NITRITE: NEGATIVE
PROTEIN: NEGATIVE mg/dL
SPECIFIC GRAVITY, URINE: 1.037 — AB (ref 1.005–1.030)
pH: 5 (ref 5.0–8.0)

## 2018-09-25 LAB — PHOSPHORUS: Phosphorus: 2.3 mg/dL — ABNORMAL LOW (ref 2.5–4.6)

## 2018-09-25 LAB — CBG MONITORING, ED
Glucose-Capillary: 231 mg/dL — ABNORMAL HIGH (ref 70–99)
Glucose-Capillary: 199 mg/dL — ABNORMAL HIGH (ref 70–99)
Glucose-Capillary: 253 mg/dL — ABNORMAL HIGH (ref 70–99)
Glucose-Capillary: 253 mg/dL — ABNORMAL HIGH (ref 70–99)
Glucose-Capillary: 228 mg/dL — ABNORMAL HIGH (ref 70–99)

## 2018-09-25 LAB — GLUCOSE, CAPILLARY: GLUCOSE-CAPILLARY: 193 mg/dL — AB (ref 70–99)

## 2018-09-25 LAB — BILIRUBIN, DIRECT: Bilirubin, Direct: 1.1 mg/dL — ABNORMAL HIGH (ref 0.0–0.2)

## 2018-09-25 LAB — LIPASE, BLOOD: LIPASE: 48 U/L (ref 11–51)

## 2018-09-25 LAB — PROTIME-INR
INR: 1.06
Prothrombin Time: 13.7 seconds (ref 11.4–15.2)

## 2018-09-25 LAB — MAGNESIUM: MAGNESIUM: 2 mg/dL (ref 1.7–2.4)

## 2018-09-25 MED ORDER — INSULIN ASPART 100 UNIT/ML ~~LOC~~ SOLN: 0.0000 [IU] | Freq: Every day | SUBCUTANEOUS | Status: DC

## 2018-09-25 MED ORDER — PANTOPRAZOLE SODIUM 40 MG PO TBEC
40.0000 mg | DELAYED_RELEASE_TABLET | Freq: Every day | ORAL | Status: DC
  Administered 2018-09-25 – 2018-09-28 (×4): 40 mg via ORAL
  Filled 2018-09-25 (×4): qty 1

## 2018-09-25 MED ORDER — IOHEXOL 300 MG/ML  SOLN
100.0000 mL | Freq: Once | INTRAMUSCULAR | Status: AC | PRN
  Administered 2018-09-25: 100 mL via INTRAVENOUS

## 2018-09-25 MED ORDER — PIPERACILLIN-TAZOBACTAM 3.375 G IVPB 30 MIN
3.3750 g | Freq: Once | INTRAVENOUS | Status: AC
  Administered 2018-09-25: 3.375 g via INTRAVENOUS
  Filled 2018-09-25: qty 50

## 2018-09-25 MED ORDER — INSULIN ASPART 100 UNIT/ML ~~LOC~~ SOLN
0.0000 [IU] | Freq: Three times a day (TID) | SUBCUTANEOUS | Status: DC
  Administered 2018-09-26 (×3): 3 [IU] via SUBCUTANEOUS
  Administered 2018-09-27 (×3): 2 [IU] via SUBCUTANEOUS
  Administered 2018-09-28 – 2018-09-29 (×3): 3 [IU] via SUBCUTANEOUS

## 2018-09-25 MED ORDER — HYDROMORPHONE HCL 1 MG/ML IJ SOLN
1.0000 mg | INTRAMUSCULAR | Status: DC | PRN
  Administered 2018-09-25 – 2018-09-26 (×2): 1 mg via INTRAVENOUS
  Filled 2018-09-25 (×4): qty 1

## 2018-09-25 MED ORDER — PIPERACILLIN-TAZOBACTAM 3.375 G IVPB
3.3750 g | Freq: Three times a day (TID) | INTRAVENOUS | Status: DC
  Administered 2018-09-25 – 2018-09-28 (×9): 3.375 g via INTRAVENOUS
  Filled 2018-09-25 (×9): qty 50

## 2018-09-25 MED ORDER — PIPERACILLIN-TAZOBACTAM 3.375 G IVPB 30 MIN: 3.3750 g | Freq: Three times a day (TID) | INTRAVENOUS | Status: DC

## 2018-09-25 MED ORDER — FAMOTIDINE IN NACL 20-0.9 MG/50ML-% IV SOLN: 20.0000 mg | Freq: Two times a day (BID) | INTRAVENOUS | Status: DC

## 2018-09-25 MED ORDER — HYDROMORPHONE HCL 1 MG/ML IJ SOLN
1.0000 mg | Freq: Once | INTRAMUSCULAR | Status: AC
  Administered 2018-09-25: 1 mg via INTRAVENOUS
  Filled 2018-09-25: qty 1

## 2018-09-25 MED ORDER — GLIMEPIRIDE 2 MG PO TABS
4.0000 mg | ORAL_TABLET | Freq: Every day | ORAL | Status: DC
  Administered 2018-09-27: 4 mg via ORAL
  Filled 2018-09-25: qty 1
  Filled 2018-09-25: qty 2

## 2018-09-25 MED ORDER — ONDANSETRON HCL 4 MG/2ML IJ SOLN
4.0000 mg | Freq: Four times a day (QID) | INTRAMUSCULAR | Status: DC | PRN
  Administered 2018-09-29 – 2018-09-30 (×2): 4 mg via INTRAVENOUS
  Filled 2018-09-25 (×2): qty 2

## 2018-09-25 MED ORDER — ACETAMINOPHEN 650 MG RE SUPP: 650.0000 mg | Freq: Four times a day (QID) | RECTAL | Status: DC | PRN

## 2018-09-25 MED ORDER — PANTOPRAZOLE SODIUM 40 MG PO TBEC: 40.0000 mg | DELAYED_RELEASE_TABLET | Freq: Every day | ORAL | Status: DC

## 2018-09-25 MED ORDER — LOSARTAN POTASSIUM 25 MG PO TABS: 25.0000 mg | ORAL_TABLET | Freq: Every day | ORAL | Status: DC

## 2018-09-25 MED ORDER — ACETAMINOPHEN 325 MG PO TABS: 650.0000 mg | ORAL_TABLET | Freq: Four times a day (QID) | ORAL | Status: DC | PRN

## 2018-09-25 MED ORDER — LOSARTAN POTASSIUM 50 MG PO TABS
100.0000 mg | ORAL_TABLET | Freq: Every day | ORAL | Status: DC
  Administered 2018-09-25 – 2018-09-28 (×4): 100 mg via ORAL
  Filled 2018-09-25 (×2): qty 2
  Filled 2018-09-25: qty 4
  Filled 2018-09-25: qty 2

## 2018-09-25 MED ORDER — INSULIN ASPART 100 UNIT/ML ~~LOC~~ SOLN
0.0000 [IU] | SUBCUTANEOUS | Status: DC
  Administered 2018-09-25: 3 [IU] via SUBCUTANEOUS
  Administered 2018-09-25: 5 [IU] via SUBCUTANEOUS
  Administered 2018-09-25: 8 [IU] via SUBCUTANEOUS
  Administered 2018-09-25: 5 [IU] via SUBCUTANEOUS
  Filled 2018-09-25 (×5): qty 1

## 2018-09-25 MED ORDER — FUROSEMIDE 40 MG PO TABS
40.0000 mg | ORAL_TABLET | Freq: Every day | ORAL | Status: DC
  Administered 2018-09-25 – 2018-09-28 (×4): 40 mg via ORAL
  Filled 2018-09-25 (×4): qty 1

## 2018-09-25 MED ORDER — CARVEDILOL 3.125 MG PO TABS
6.2500 mg | ORAL_TABLET | Freq: Two times a day (BID) | ORAL | Status: DC
  Administered 2018-09-26 – 2018-10-02 (×12): 6.25 mg via ORAL
  Filled 2018-09-25 (×13): qty 2

## 2018-09-25 MED ORDER — ONDANSETRON HCL 4 MG PO TABS: 4.0000 mg | ORAL_TABLET | Freq: Four times a day (QID) | ORAL | Status: DC | PRN

## 2018-09-25 MED ORDER — FAMOTIDINE IN NACL 20-0.9 MG/50ML-% IV SOLN
20.0000 mg | Freq: Two times a day (BID) | INTRAVENOUS | Status: DC
  Administered 2018-09-25 – 2018-09-28 (×7): 20 mg via INTRAVENOUS
  Filled 2018-09-25 (×7): qty 50

## 2018-09-25 NOTE — ED Notes (Signed)
Nurse unavailable for report at this time. Floor to call ED back for report.

## 2018-09-25 NOTE — ED Notes (Signed)
Dr. Jenkins at bedside. 

## 2018-09-25 NOTE — H&P (Signed)
History and Physical    [name] [name] QXI:[id number] DOB: Oct 17, 1955 DOA: 09/24/2018  PCP: [name], MD  Patient coming from: Home.  I have personally briefly reviewed patient's old medical records in [facility]  Chief Complaint: Abdominal pain  HPI: [name] [name] is a 63 y.o. male with medical history significant of anemia, cryptogenic cirrhosis, status post TIPS procedure, type 2 diabetes mellitus, GERD, hypertension, history of cholelithiasis who is coming to the emergency department with complaints of abdominal pain, diarrhea, nausea and emesis since earlier in the evening.  The patient states that he has been having this pain for a while 1-1/2 to 2 months, but it has not been this intense.  He denies fever, chills, but feels fatigued.  No dyspnea, wheezing, hemoptysis, chest pain, palpitations, dizziness, diaphoresis, PND orthopnea.  He gets occasional lower extremity edema.  He denies dysuria, frequency or hematuria.  No polyuria, polydipsia, polyphagia or blurred vision.  He denies skin rashes or pruritus.  ED Course: Initial vital signs temperature 97.8 F, pulse 71, respiration 18, blood pressure 159/38 mmHg and O2 sat 95% on room air.  The patient received fentanyl 50 mcg IVP x1 dose, hydromorphone 1 mg IVP x1 dose, Zofran 4 mg IVP x1 and Zosyn 3.375 g IVPB.  White count is 11.6, hemoglobin 13.2 g/dL and platelets 034.  Sodium 137, potassium 4.9, chloride 108 and CO2 21 mmol/L.  Glucose 240, BUN 18, creatinine 1.26, calcium 8.6, total bilirubin 3.1 mg/dL.  Total protein was normal, but albumin was low-3.3 g/dL.  His alkaline phosphatase mildly elevated 132.  AST and ALT were normal.  Imaging: CT abdomen/pelvis shows abnormal appearance of the gallbladder which contains intraluminal high density material.  There was hazy edema and fluid around the gallbladder with a stone present in the gallbladder neck.  There is additional finding of hemorrhagic fluid along the inferior  margin of the liver with rounded calcification in the hemorrhagic fluid, question gallbladder perforation with extravasated stones.  There is associated hemoperitoneum.  There is cirrhosis of the liver.  There is a small amount of hemorrhagic ascites within the pelvis.  Please see images and full radiology report for further detail.  Review of Systems: As per HPI otherwise 10 point review of systems negative.   Past Medical History:  Diagnosis Date  . Anemia   . Cryptogenic cirrhosis (HCC)   . Diabetes mellitus   . GERD (gastroesophageal reflux disease)   . Hypertension   . Liver disease     Past Surgical History:  Procedure Laterality Date  . COLONOSCOPY    . INCISION AND DRAINAGE OF WOUND Right 12/28/2016   Procedure: IRRIGATION AND DEBRIDEMENT WOUND;  Surgeon: Betha Loa, MD;  Location: [facility];  Service: Orthopedics;  Laterality: Right;  . LIVER BIOPSY    . NERVE, TENDON AND ARTERY REPAIR Right 12/28/2016   Procedure: NERVE, TENDON AND ARTERY REPAIR;  Surgeon: Betha Loa, MD;  Location: [facility];  Service: Orthopedics;  Laterality: Right;  . PERCUTANEOUS PINNING Right 12/28/2016   Procedure: PERCUTANEOUS PINNING EXTREMITY;  Surgeon: Betha Loa, MD;  Location: [facility];  Service: Orthopedics;  Laterality: Right;  . TIPS PROCEDURE  2008  . UPPER GASTROINTESTINAL ENDOSCOPY       reports that he has been smoking cigarettes. He has a 40.00 pack-year smoking history. He has never used smokeless tobacco. He reports that he does not drink alcohol or use drugs.  No Known Allergies  Family History  Problem Relation Age of Onset  . Healthy Mother   . Diabetes Father   . Heart disease Father   . Healthy Sister   . Healthy Sister    Prior to Admission medications   Medication Sig Start Date End Date Taking? Authorizing Provider  carvedilol (COREG) 6.25 MG tablet Take 6.25 mg by mouth 2 (two) times daily with a meal.   Yes  [provider]  Cyanocobalamin (VITAMIN B-12 IJ) Inject as directed once a week. Currently on starter - for 6 weeks then monthly.Currently in 4 th week   Yes [provider]  furosemide (LASIX) 40 MG tablet TAKE 1 TABLET BY MOUTH EVERYDAY OR EVERY OTHER DAY. 09/03/14  Yes Setzer, Terri L, NP  glimepiride (AMARYL) 4 MG tablet Take 4 mg by mouth daily before breakfast.   Yes [provider]  losartan (COZAAR) 100 MG tablet Take 100 mg by mouth daily.   Yes [provider]  omeprazole (PRILOSEC) 20 MG capsule TAKE 1 CAPSULE BY MOUTH EVERY MORNING FOR ACID REFLUX. 07/05/14  Yes Rehman, Joline MaxcyNajeeb U, MD  ciprofloxacin (CIPRO) 750 MG tablet TAKE ONE TABLET BY MOUTH EVERY WEEK. 05/03/15   Setzer, Brand Maleserri L, NP  oxyCODONE (ROXICODONE) 5 MG immediate release tablet 1-2 tabs po q6 hours prn pain 12/28/16   Betha LoaKuzma, Kevin, MD  sulfamethoxazole-trimethoprim (BACTRIM DS) 800-160 MG tablet Take 1 tablet by mouth 2 (two) times daily. 12/28/16   Betha LoaKuzma, Kevin, MD    Physical Exam: Vitals:   09/24/18 2340 09/25/18 0219 09/25/18 0230 09/25/18 0300  BP: (!) 150/43 (!) 128/42 (!) 119/44 (!) 112/44  Pulse: 73 88 91 88  Resp: 20 17 14 14   Temp:  99.4 F (37.4 C)    TempSrc:  Oral    SpO2: 98% 96% 94% 91%  Weight:      Height:        Constitutional: NAD, calm, comfortable Eyes: PERRL, lids and conjunctivae normal ENMT: Mucous membranes are dry (patient states he mouth breathes). Posterior pharynx clear of any exudate or lesions. Neck: normal, supple, no masses, no thyromegaly Respiratory: clear to auscultation bilaterally, no wheezing, no crackles. Normal respiratory effort. No accessory muscle use.  Cardiovascular: Regular rate and rhythm, no murmurs / rubs / gallops. No extremity edema. 2+ pedal pulses. No carotid bruits.  Abdomen: Nondistended, soft, positive RUQ and epigastric tenderness, no guarding or rebound, masses palpated. No hepatosplenomegaly. Bowel sounds positive.    Musculoskeletal: no clubbing / cyanosis. No joint deformity upper and lower extremities. Good ROM, no contractures. Normal muscle tone.  Skin: no gross rashes, lesions, ulcers on limited dermatological examination. Neurologic: CN 2-12 grossly intact. Sensation intact, DTR normal. Strength 5/5 in all 4.  Psychiatric: Normal judgment and insight. Alert and oriented x 3. Normal mood.   Labs on Admission: I have personally reviewed following labs and imaging studies  CBC: Recent Labs  Lab 09/24/18 2350  WBC 11.6*  NEUTROABS 9.8*  HGB 13.2  HCT 40.2  MCV 92.8  PLT 106*   Basic Metabolic Panel: Recent Labs  Lab 09/24/18 2350  NA 137  K 4.9  CL 108  CO2 21*  GLUCOSE 240*  BUN 18  CREATININE 1.26*  CALCIUM 8.6*  MG 2.0  PHOS 2.3*   GFR: Estimated Creatinine Clearance: 76.3 mL/min (A) (by C-G formula based on SCr of 1.26 mg/dL (H)). Liver Function Tests: Recent Labs  Lab 09/24/18 2350  AST 35  ALT 40  ALKPHOS 132*  BILITOT 3.1*  PROT 6.9  ALBUMIN 3.3*   Recent Labs  Lab 09/24/18 2350  LIPASE 48   No results for input(s): AMMONIA in the last 168 hours. Coagulation Profile: No results for input(s): INR, PROTIME in the last 168 hours. Cardiac Enzymes: No results for input(s): CKTOTAL, CKMB, CKMBINDEX, TROPONINI in the last 168 hours. BNP (last 3 results) No results for input(s): PROBNP in the last 8760 hours. HbA1C: No results for input(s): HGBA1C in the last 72 hours. CBG: No results for input(s): GLUCAP in the last 168 hours. Lipid Profile: No results for input(s): CHOL, HDL, LDLCALC, TRIG, CHOLHDL, LDLDIRECT in the last 72 hours. Thyroid Function Tests: No results for input(s): TSH, T4TOTAL, FREET4, T3FREE, THYROIDAB in the last 72 hours. Anemia Panel: No results for input(s): VITAMINB12, FOLATE, FERRITIN, TIBC, IRON, RETICCTPCT in the last 72 hours. Urine analysis: No results found for: COLORURINE, APPEARANCEUR, LABSPEC, PHURINE, GLUCOSEU, HGBUR,  BILIRUBINUR, KETONESUR, PROTEINUR, UROBILINOGEN, NITRITE, LEUKOCYTESUR  Radiological Exams on Admission: Ct Abdomen Pelvis W Contrast  Result Date: 09/25/2018 CLINICAL DATA:  Upper abdominal pain with nausea and vomiting EXAM: CT ABDOMEN AND PELVIS WITH CONTRAST TECHNIQUE: Multidetector CT imaging of the abdomen and pelvis was performed using the standard protocol following bolus administration of intravenous contrast. CONTRAST:  OMNIPAQUE IOHEXOL 300 MG/ML  SOLN COMPARISON:  CT 01/01/2006 FINDINGS: Lower chest: Lung bases demonstrate no acute consolidation or effusion. The heart size is within normal limits. Small hiatal hernia Hepatobiliary: Cirrhotic morphology of the liver. Tips shunt is in place. No focal hepatic abnormality or biliary dilatation. Abnormal appearance of gallbladder with high density intraluminal material and hazy edema around the gallbladder lumen. Stone at the gallbladder neck. High density fluid along the inferior hepatic margin suspicious for hemoperitoneum. Rounded calcification in the fluid, series 2, image number 27, possibly a gallstone. Pancreas: Unremarkable. No pancreatic ductal dilatation or surrounding inflammatory changes. Spleen: Normal in size without focal abnormality. Adrenals/Urinary Tract: Adrenal glands are unremarkable. Kidneys are normal, without renal calculi, focal lesion, or hydronephrosis. Bladder is unremarkable. Stomach/Bowel: Stomach is within normal limits. Appendix appears normal. No evidence of bowel wall thickening, distention, or inflammatory changes. Vascular/Lymphatic: Nonaneurysmal aorta. Mild aortic atherosclerosis. No significantly enlarged lymph nodes Reproductive: Slightly enlarged prostate with calcification Other: No free air. Small slightly hyperdense fluid within the pelvis with more moderate fluid in the right upper quadrant adjacent to the liver. Fat containing inguinal hernias. Musculoskeletal: Advanced degenerative changes of the  spine. No acute osseous abnormality. IMPRESSION: 1. Abnormal appearance of the gallbladder which contains intraluminal high density material. There is hazy edema and or fluid around the gallbladder with a stone present in the gallbladder neck. Findings could be secondary to an acute cholecystitis. Additional finding of hemorrhagic fluid along the inferior margin of the liver with rounded calcifications in the hemorrhagic fluid, question gallbladder perforation with extravasated stones. Associated hemoperitoneum. 2. Cirrhosis of the liver. 3. Small amount of hemorrhagic ascites within the pelvis. Electronically Signed   By: Jasmine Pang M.D.   On: 09/25/2018 02:02    EKG: Independently reviewed.  Vent. rate 73 BPM PR interval * ms QRS duration 92 ms QT/QTc 418/461 ms P-R-T axes 25 62 -8 Sinus rhythm Minimal ST depression, diffuse leads likely related to baseline wander No significant changes when compared to previous tracings.  Assessment/Plan Principal Problem:   Acute cholecystitis Admit to telemetry/inpatient. Keep n.p.o. Continue supplemental oxygen. Continue IV fluids. Continue Zosyn 3.375 g IVPB every 8 hours. Analgesics as needed. Antiemetics as needed. Obtain RUQ ultrasound for better characterization.  General surgery to evaluate later today.  Active Problems:   GERD (gastroesophageal reflux disease) Famotidine 20 mg IVPB every 12 hours    Tobacco use Declined nicotine replacement therapy. Will have the staff provide tobacco cessation information.    Hypophosphatemia Replacement ordered. Follow-up level as needed.    Hypertension Hold carvedilol, furosemide and losartan. Will use scheduled IV metoprolol and as needed hydralazine. Monitor blood pressure, heart rate, renal function and electrolytes.   DVT prophylaxis: SCDs. Code Status: Full code. Family Communication: Disposition Plan: Admit for symptoms management, IV antibiotic and general surgery  evaluation. Consults called: General surgery consultation. Admission status: Inpatient/telemetry.   [name] MD [facility]  09/25/2018, 5:38 AM   This document was prepared using Dragon voice recognition software and may contain some unintended transcription errors.

## 2018-09-25 NOTE — Progress Notes (Signed)
PROGRESS NOTE                                                                                                                                                                                                             Patient Demographics:    [name] [name], is a 63 y.o. male, DOB - 12/22/1955, ACZ:[id number]  Admit date - 09/24/2018   Admitting Physician No admitting provider for patient encounter.  Outpatient Primary MD for the patient is [name], MD  LOS - 0  Outpatient Specialists: None (previously followed with GI Dr. Karilyn Cota )  Chief Complaint  Patient presents with  . 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  Reports off-and-on pain for almost 2 months.  Denies jaundice, fevers, chills, shortness of breath, chest pain, hematemesis, melena. Patient found to have acute calculus cholecystitis with stone present in the gallbladder neck.  There is additional finding of hemorrhagic fluid along the inferior margin of the liver with concern for gallbladder perforation with extravasated stone and associated hemoperitoneum.  There is also small amount of hemorrhagic ascites within the pelvis.      Subjective:   Patient complains of epigastric pain and also having some right groin pain this morning.  Reports noticing his urine to be dark red.   Assessment  & Plan :    Principal Problem:   Acute calculus cholecystitis with?  Gallbladder perforation and hemoperitoneum Strict n.p.o.  Pain control with PRN Dilaudid, supportive care with antiemetics.  Continue Ringer's lactate at 125 cc/h.  Monitor for fluid overload.  Empiric IV Zosyn.  Ordered abdominal  ultrasound to evaluate?  Gallbladder perforation and hemoperitoneum.  Serial abdominal exam. Monitor LFTs (total bili of 2.6 this morning). General surgery consulted.  Active Problems: Essential hypertension On Coreg, Lasix and losartan at home which are on hold.  Placed on scheduled IV Lopressor and PRN hydralazine.  Diabetes mellitus type 2 with hyperglycemia CBG in 200s.  Amaryl on hold.  Monitor on sliding scale coverage.  Acute kidney injury (HCC) Baseline creatinine <1, 1.44 this morning.  Possibly prerenal with dehydration.  Hold Lasix and losartan.  Monitor with IV fluids.     GERD (gastroesophageal reflux disease) Continue Pepcid.    Tobacco use Counseled on cessation.  Refused nicotine patch    Hypophosphatemia Ordered replenishment.  Thrombocytopenia Chronic (last lab almost 4 years back with platelets in low 100s).  Likely associated with cryptogenic cirrhosis.  Previously followed by Dr. Karilyn Cotaehman has not been seen in last 4  Years. monitor closely.  ?  Hematuria Check UA.  Code Status : Full code  Family Communication  : Sister at bedside  Disposition Plan  : Home once symptoms resolved, pending surgical evaluation and intervention  Barriers For Discharge : Active symptoms  Consults  : Surgery  Procedures  : CT abdomen/ultrasound abdomen  DVT Prophylaxis  : SCDs  Lab Results  Component Value Date   PLT 97 (L) 09/25/2018    Antibiotics  :    Anti-infectives (From admission, onward)   Start     Dose/Rate Route Frequency Ordered Stop   09/25/18 1000  piperacillin-tazobactam (ZOSYN) IVPB 3.375 g     3.375 g 12.5 mL/hr over 240 Minutes Intravenous Every 8 hours 09/25/18 0249     09/25/18 0600  piperacillin-tazobactam (ZOSYN) IVPB 3.375 g  Status:  Discontinued     3.375 g 100 mL/hr over 30 Minutes Intravenous Every 8 hours 09/25/18 0245 09/25/18 0250   09/25/18 0215  piperacillin-tazobactam (ZOSYN) IVPB 3.375 g     3.375 g 100 mL/hr over 30 Minutes  Intravenous  Once 09/25/18 0206 09/25/18 0246        Objective:   Vitals:   09/25/18 0530 09/25/18 0651 09/25/18 0700 09/25/18 0730  BP: (!) 105/49 138/68 (!) 157/69 129/61  Pulse: 85 85 85 84  Resp: 13 18 18 16   Temp:      TempSrc:      SpO2: 96% 100% 99% 99%  Weight:      Height:        Wt Readings from Last 3 Encounters:  09/24/18 108.9 kg  12/28/16 110.2 kg  11/11/14 112.6 kg     Intake/Output Summary (Last 24 hours) at 09/25/2018 0818 Last data filed at 09/25/2018 0321 Gross per 24 hour  Intake 1050 ml  Output -  Net 1050 ml     Physical Exam  Gen: not in distress, fatigued HEENT: no pallor, mild icterus, moist mucosa, supple neck Chest: clear b/l, no added sounds CVS: N S1&S2, no murmurs, rubs or gallop GI: soft, nondistended, bowel sounds present, epigastric and right upper quadrant tenderness to minimal pressure.  Mild right lower quadrant tenderness as well Musculoskeletal: warm, no edema     Data Review:    CBC Recent Labs  Lab 09/24/18 2350 09/25/18 0714  WBC 11.6* 11.6*  HGB 13.2 12.1*  HCT 40.2 36.8*  PLT 106* 97*  MCV 92.8 94.4  MCH 30.5 31.0  MCHC 32.8 32.9  RDW 13.5 13.9  LYMPHSABS 0.7 1.0  MONOABS 1.0 1.0  EOSABS 0.1 0.1  BASOSABS 0.0 0.0    Chemistries  Recent Labs  Lab 09/24/18 2350 09/25/18 0714  NA 137 138  K 4.9 4.8  CL 108 109  CO2 21* 21*  GLUCOSE 240* 248*  BUN 18 21  CREATININE 1.26* 1.44*  CALCIUM 8.6* 8.4*  MG 2.0  --   AST 35 35  ALT 40 37  ALKPHOS 132* 109  BILITOT 3.1* 2.6*   ------------------------------------------------------------------------------------------------------------------ No results for input(s): CHOL, HDL, LDLCALC, TRIG, CHOLHDL, LDLDIRECT in the last 72 hours.  No results found for: HGBA1C ------------------------------------------------------------------------------------------------------------------ No results for input(s): TSH, T4TOTAL, T3FREE, THYROIDAB in the last 72  hours.  Invalid input(s): FREET3 ------------------------------------------------------------------------------------------------------------------ No results for input(s): VITAMINB12, FOLATE, FERRITIN, TIBC, IRON, RETICCTPCT in the last 72 hours.  Coagulation profile No results for input(s): INR, PROTIME in the last 168 hours.  No results for input(s): DDIMER in the last 72 hours.  Cardiac Enzymes No results for input(s): CKMB, TROPONINI, MYOGLOBIN in the last 168 hours.  Invalid input(s): CK ------------------------------------------------------------------------------------------------------------------ No results found for: BNP  Inpatient Medications  Scheduled Meds: Continuous Infusions: . famotidine (PEPCID) IV 20 mg (09/25/18 0705)  . lactated ringers Stopped (09/25/18 0321)  . piperacillin-tazobactam (ZOSYN)  IV     PRN Meds:.acetaminophen **OR** acetaminophen, HYDROmorphone (DILAUDID) injection, ondansetron **OR** ondansetron (ZOFRAN) IV  Micro Results No results found for this or any previous visit (from the past 240 hour(s)).  Radiology Reports Ct Abdomen Pelvis W Contrast  Result Date: 09/25/2018 CLINICAL DATA:  Upper abdominal pain with nausea and vomiting EXAM: CT ABDOMEN AND PELVIS WITH CONTRAST TECHNIQUE: Multidetector CT imaging of the abdomen and pelvis was performed using the standard protocol following bolus administration of intravenous contrast. CONTRAST:  OMNIPAQUE IOHEXOL 300 MG/ML  SOLN COMPARISON:  CT 01/01/2006 FINDINGS: Lower chest: Lung bases demonstrate no acute consolidation or effusion. The heart size is within normal limits. Small hiatal hernia Hepatobiliary: Cirrhotic morphology of the liver. Tips shunt is in place. No focal hepatic abnormality or biliary dilatation. Abnormal appearance of gallbladder with high density intraluminal material and hazy edema around the gallbladder lumen. Stone at the gallbladder neck. High density fluid along the  inferior hepatic margin suspicious for hemoperitoneum. Rounded calcification in the fluid, series 2, image number 27, possibly a gallstone. Pancreas: Unremarkable. No pancreatic ductal dilatation or surrounding inflammatory changes. Spleen: Normal in size without focal abnormality. Adrenals/Urinary Tract: Adrenal glands are unremarkable. Kidneys are normal, without renal calculi, focal lesion, or hydronephrosis. Bladder is unremarkable. Stomach/Bowel: Stomach is within normal limits. Appendix appears normal. No evidence of bowel wall thickening, distention, or inflammatory changes. Vascular/Lymphatic: Nonaneurysmal aorta. Mild aortic atherosclerosis. No significantly enlarged lymph nodes Reproductive: Slightly enlarged prostate with calcification Other: No free air. Small slightly hyperdense fluid within the pelvis with more moderate fluid in the right upper quadrant adjacent to the liver. Fat containing inguinal hernias. Musculoskeletal: Advanced degenerative changes of the spine. No acute osseous abnormality. IMPRESSION: 1. Abnormal appearance of the gallbladder which contains intraluminal high density material. There is hazy edema and or fluid around the gallbladder with a stone present in the gallbladder neck. Findings could be secondary to an acute cholecystitis. Additional finding of hemorrhagic fluid along the inferior margin of the liver with rounded calcifications in the hemorrhagic fluid, question gallbladder perforation with extravasated stones. Associated hemoperitoneum. 2. Cirrhosis of the liver. 3. Small amount of hemorrhagic ascites within the pelvis. Electronically Signed   By: Jasmine Pang M.D.   On: 09/25/2018 02:02    Time Spent in minutes  20   Zachary Nole M.D on 09/25/2018 at 8:18 AM  Between 7am to 7pm - Pager - [phone]  After 7pm go to [email or web address] - password [email or web address]  [facility] -  Office  [phone]

## 2018-09-25 NOTE — ED Notes (Signed)
Melody Comas, sister, [phone]

## 2018-09-25 NOTE — Consult Note (Signed)
Reason for Consult: Perforated cholecystitis, cholelithiasis, cirrhosis Referring Physician: Dr. Ainsley Spinner [name] [name] is an 63 y.o. male.  HPI: Patient is a 63 year old white male who presented to the emergency room with a several month history of worsening upper abdominal pain.  As his pain worsened and he started having diarrhea, nausea, and emesis, he presented to the emergency room.  CT scan of the abdomen was performed which revealed acute cholecystitis, cholelithiasis, and probable perforation of the gallbladder.  He denies any fever, chills, or jaundice.  His liver enzyme tests were within normal limits except for an elevated total bilirubin.  His white blood cell count is within normal limits.  He is hungry.  He has a known history of cholelithiasis.  He is status post a TIPS procedure for cryptogenic cirrhosis.  He has not seen a GI doctor in many years.  He currently has minimal right upper quadrant abdominal pain.  Past Medical History:  Diagnosis Date  . Anemia   . Cryptogenic cirrhosis (HCC)   . Diabetes mellitus   . GERD (gastroesophageal reflux disease)   . Hypertension   . Liver disease     Past Surgical History:  Procedure Laterality Date  . COLONOSCOPY    . INCISION AND DRAINAGE OF WOUND Right 12/28/2016   Procedure: IRRIGATION AND DEBRIDEMENT WOUND;  Surgeon: Betha Loa, MD;  Location: [facility];  Service: Orthopedics;  Laterality: Right;  . LIVER BIOPSY    . NERVE, TENDON AND ARTERY REPAIR Right 12/28/2016   Procedure: NERVE, TENDON AND ARTERY REPAIR;  Surgeon: Betha Loa, MD;  Location: [facility];  Service: Orthopedics;  Laterality: Right;  . PERCUTANEOUS PINNING Right 12/28/2016   Procedure: PERCUTANEOUS PINNING EXTREMITY;  Surgeon: Betha Loa, MD;  Location: [facility];  Service: Orthopedics;  Laterality: Right;  . TIPS PROCEDURE  2008  . UPPER GASTROINTESTINAL ENDOSCOPY      Family History  Problem Relation  Age of Onset  . Healthy Mother   . Diabetes Father   . Heart disease Father   . Healthy Sister   . Healthy Sister     Social History:  reports that he has been smoking cigarettes. He has a 40.00 pack-year smoking history. He has never used smokeless tobacco. He reports that he does not drink alcohol or use drugs.  Allergies: No Known Allergies  Medications: I have reviewed the patient's current medications.  Results for orders placed or performed during the hospital encounter of 09/24/18 (from the past 48 hour(s))  Comprehensive metabolic panel     Status: Abnormal   Collection Time: 09/24/18 11:50 PM  Result Value Ref Range   Sodium 137 135 - 145 mmol/L   Potassium 4.9 3.5 - 5.1 mmol/L   Chloride 108 98 - 111 mmol/L   CO2 21 (L) 22 - 32 mmol/L   Glucose, Bld 240 (H) 70 - 99 mg/dL   BUN 18 8 - 23 mg/dL   Creatinine, Ser 9.62 (H) 0.61 - 1.24 mg/dL   Calcium 8.6 (L) 8.9 - 10.3 mg/dL   Total Protein 6.9 6.5 - 8.1 g/dL   Albumin 3.3 (L) 3.5 - 5.0 g/dL   AST 35 15 - 41 U/L   ALT 40 0 - 44 U/L   Alkaline Phosphatase 132 (H) 38 - 126 U/L   Total Bilirubin 3.1 (H) 0.3 - 1.2 mg/dL   GFR calc non Af Amer >60 >60 mL/min   GFR calc Af Amer >60 >60 mL/min  Anion gap 8 5 - 15    Comment: Performed at [facility], [address] [place], [place] [address]  Lipase, blood     Status: None   Collection Time: 09/24/18 11:50 PM  Result Value Ref Range   Lipase 48 11 - 51 U/L    Comment: Performed at [facility], [address] [place], [place] [address]  CBC with Diff     Status: Abnormal   Collection Time: 09/24/18 11:50 PM  Result Value Ref Range   WBC 11.6 (H) 4.0 - 10.5 K/uL   RBC 4.33 4.22 - 5.81 MIL/uL   Hemoglobin 13.2 13.0 - 17.0 g/dL   HCT 09.840.2 11.939.0 - 14.752.0 %   MCV 92.8 80.0 - 100.0 fL   MCH 30.5 26.0 - 34.0 pg   MCHC 32.8 30.0 - 36.0 g/dL   RDW 82.913.5 56.211.5 - 13.015.5 %   Platelets 106 (L) 150 - 400 K/uL    Comment: PLATELET COUNT CONFIRMED BY SMEAR SPECIMEN CHECKED FOR  CLOTS Immature Platelet Fraction may be clinically indicated, consider ordering this additional test [id number]    nRBC 0.0 0.0 - 0.2 %   Neutrophils Relative % 84 %   Neutro Abs 9.8 (H) 1.7 - 7.7 K/uL   Lymphocytes Relative 6 %   Lymphs Abs 0.7 0.7 - 4.0 K/uL   Monocytes Relative 8 %   Monocytes Absolute 1.0 0.1 - 1.0 K/uL   Eosinophils Relative 1 %   Eosinophils Absolute 0.1 0.0 - 0.5 K/uL   Basophils Relative 0 %   Basophils Absolute 0.0 0.0 - 0.1 K/uL    Comment: Performed at [facility], [address] [place], [place] [address]  Magnesium     Status: None   Collection Time: 09/24/18 11:50 PM  Result Value Ref Range   Magnesium 2.0 1.7 - 2.4 mg/dL    Comment: Performed at [facility], [address] [place], [place] [address]  Phosphorus     Status: Abnormal   Collection Time: 09/24/18 11:50 PM  Result Value Ref Range   Phosphorus 2.3 (L) 2.5 - 4.6 mg/dL    Comment: Performed at [facility], [address] [place], [place] [address]  CBC WITH DIFFERENTIAL     Status: Abnormal   Collection Time: 09/25/18  7:14 AM  Result Value Ref Range   WBC 11.6 (H) 4.0 - 10.5 K/uL   RBC 3.90 (L) 4.22 - 5.81 MIL/uL   Hemoglobin 12.1 (L) 13.0 - 17.0 g/dL   HCT 27.236.8 (L) 53.639.0 - 64.452.0 %   MCV 94.4 80.0 - 100.0 fL   MCH 31.0 26.0 - 34.0 pg   MCHC 32.9 30.0 - 36.0 g/dL   RDW 03.413.9 74.211.5 - 59.515.5 %   Platelets 97 (L) 150 - 400 K/uL    Comment: Immature Platelet Fraction may be clinically indicated, consider ordering this additional test [id number]    nRBC 0.8 (H) 0.0 - 0.2 %   Neutrophils Relative % 81 %   Neutro Abs 9.4 (H) 1.7 - 7.7 K/uL   Lymphocytes Relative 9 %   Lymphs Abs 1.0 0.7 - 4.0 K/uL   Monocytes Relative 8 %   Monocytes Absolute 1.0 0.1 - 1.0 K/uL   Eosinophils Relative 1 %   Eosinophils Absolute 0.1 0.0 - 0.5 K/uL   Basophils Relative 0 %   Basophils Absolute 0.0 0.0 - 0.1 K/uL   Immature Granulocytes 1 %   Abs Immature Granulocytes 0.07 0.00 - 0.07 K/uL    Comment:  Performed at [facility], [address], [place], [place] [address]  Comprehensive metabolic panel     Status: Abnormal   Collection Time: 09/25/18  7:14 AM  Result Value Ref Range   Sodium 138 135 - 145 mmol/L   Potassium 4.8 3.5 - 5.1 mmol/L   Chloride 109 98 - 111 mmol/L   CO2 21 (L) 22 - 32 mmol/L   Glucose, Bld 248 (H) 70 - 99 mg/dL   BUN 21 8 - 23 mg/dL   Creatinine, Ser 6.21 (H) 0.61 - 1.24 mg/dL   Calcium 8.4 (L) 8.9 - 10.3 mg/dL   Total Protein 6.4 (L) 6.5 - 8.1 g/dL   Albumin 3.1 (L) 3.5 - 5.0 g/dL   AST 35 15 - 41 U/L   ALT 37 0 - 44 U/L   Alkaline Phosphatase 109 38 - 126 U/L   Total Bilirubin 2.6 (H) 0.3 - 1.2 mg/dL   GFR calc non Af Amer 52 (L) >60 mL/min   GFR calc Af Amer 60 (L) >60 mL/min   Anion gap 8 5 - 15    Comment: Performed at [facility], [address], [place], [place] [address]  Bilirubin, direct     Status: Abnormal   Collection Time: 09/25/18  7:14 AM  Result Value Ref Range   Bilirubin, Direct 1.1 (H) 0.0 - 0.2 mg/dL    Comment: Performed at [facility], [address], [place], [place] [address]  CBG monitoring, ED     Status: Abnormal   Collection Time: 09/25/18  9:13 AM  Result Value Ref Range   Glucose-Capillary 231 (H) 70 - 99 mg/dL  Protime-INR     Status: None   Collection Time: 09/25/18  9:54 AM  Result Value Ref Range   Prothrombin Time 13.7 11.4 - 15.2 seconds   INR 1.06     Comment: Performed at [facility], [address], [place], [place] [address]  CBG monitoring, ED     Status: Abnormal   Collection Time: 09/25/18  1:15 PM  Result Value Ref Range   Glucose-Capillary 199 (H) 70 - 99 mg/dL    US Abdomen Complete  Result Date: 09/25/2018 CLINICAL DATA:  Cirrhosis.  Upper abdominal pain EXAM: ABDOMEN ULTRASOUND COMPLETE COMPARISON:  None. FINDINGS: Gallbladder: There is a 10 mm in size calculus adherent in the neck of the gallbladder. Gallbladder contains significant sludge. The gallbladder wall is diffusely thickened. There is  no pericholecystic fluid. No sonographic Murphy sign noted by sonographer. Common bile duct: Diameter: 4 mm. No demonstrable intrahepatic, common hepatic, or common bile duct dilatation. Liver: No focal lesion identified. Liver has a nodular contour with diffuse increased echogenicity. A TIPS catheter is present and noted to be patent. Portal vein is patent on color Doppler imaging with normal direction of blood flow towards the liver. IVC: No abnormality visualized in visualized regions. Much of the infrahepatic inferior vena cava is obscured by gas. Pancreas: Visualized portion unremarkable. Most of the pancreas is obscured by gas. Spleen: Size and appearance within normal limits. Spleen measures 12.1 cm. Right Kidney: Length: 11.7 cm. Echogenicity within normal limits. No mass or hydronephrosis visualized. Left Kidney: Length: 12.7 cm. Echogenicity within normal limits. No mass or hydronephrosis visualized. Abdominal aorta: No aneurysm visualized in visualized regions. Much of the aorta is obscured by gas. Other findings: There is a mild degree of ascites present. IMPRESSION: 1. Gallstone adherent in the neck of the gallbladder. Sludge throughout gallbladder. Gallbladder wall is thickened. While gallbladder wall  thickening may be seen in association with ascites, this finding in association with the gallstone adherent in the neck of the gallbladder raises concern for a degree of acute cholecystitis. It may be prudent to consider nuclear medicine hepatobiliary imaging study to assess for cystic duct patency. 2. Appearance of the liver is indicative of hepatic cirrhosis. While no focal liver lesions are evident, it must be cautioned that the sensitivity of ultrasound for detection of focal liver lesions is diminished significantly in this circumstance. TIPS catheter is present, patent. The flow in the portal vein is in the anatomic direction. 3. Most of the pancreas is obscured by gas. Much of the inferior vena  cava and aorta are obscured by gas. 4.  Spleen appears toward the upper limits of normal in size. Electronically Signed   By: Bretta Bang III M.D.   On: 09/25/2018 09:22   Ct Abdomen Pelvis W Contrast  Result Date: 09/25/2018 CLINICAL DATA:  Upper abdominal pain with nausea and vomiting EXAM: CT ABDOMEN AND PELVIS WITH CONTRAST TECHNIQUE: Multidetector CT imaging of the abdomen and pelvis was performed using the standard protocol following bolus administration of intravenous contrast. CONTRAST:  OMNIPAQUE IOHEXOL 300 MG/ML  SOLN COMPARISON:  CT 01/01/2006 FINDINGS: Lower chest: Lung bases demonstrate no acute consolidation or effusion. The heart size is within normal limits. Small hiatal hernia Hepatobiliary: Cirrhotic morphology of the liver. Tips shunt is in place. No focal hepatic abnormality or biliary dilatation. Abnormal appearance of gallbladder with high density intraluminal material and hazy edema around the gallbladder lumen. Stone at the gallbladder neck. High density fluid along the inferior hepatic margin suspicious for hemoperitoneum. Rounded calcification in the fluid, series 2, image number 27, possibly a gallstone. Pancreas: Unremarkable. No pancreatic ductal dilatation or surrounding inflammatory changes. Spleen: Normal in size without focal abnormality. Adrenals/Urinary Tract: Adrenal glands are unremarkable. Kidneys are normal, without renal calculi, focal lesion, or hydronephrosis. Bladder is unremarkable. Stomach/Bowel: Stomach is within normal limits. Appendix appears normal. No evidence of bowel wall thickening, distention, or inflammatory changes. Vascular/Lymphatic: Nonaneurysmal aorta. Mild aortic atherosclerosis. No significantly enlarged lymph nodes Reproductive: Slightly enlarged prostate with calcification Other: No free air. Small slightly hyperdense fluid within the pelvis with more moderate fluid in the right upper quadrant adjacent to the liver. Fat containing  inguinal hernias. Musculoskeletal: Advanced degenerative changes of the spine. No acute osseous abnormality. IMPRESSION: 1. Abnormal appearance of the gallbladder which contains intraluminal high density material. There is hazy edema and or fluid around the gallbladder with a stone present in the gallbladder neck. Findings could be secondary to an acute cholecystitis. Additional finding of hemorrhagic fluid along the inferior margin of the liver with rounded calcifications in the hemorrhagic fluid, question gallbladder perforation with extravasated stones. Associated hemoperitoneum. 2. Cirrhosis of the liver. 3. Small amount of hemorrhagic ascites within the pelvis. Electronically Signed   By: Jasmine Pang M.D.   On: 09/25/2018 02:02    ROS:  Pertinent items are noted in HPI.  Blood pressure (!) 126/57, pulse 85, temperature 99.4 F (37.4 C), temperature source Oral, resp. rate 16, height 5\' 11"  (1.803 m), weight 108.9 kg, SpO2 98 %. Physical Exam: Pleasant white male no acute distress Head is normocephalic, atraumatic Eyes without scleral icterus Lungs are clear to auscultation with good breath sounds bilaterally Heart examination reveals regular rate and rhythm without S3, S4, murmurs Abdomen is rotund, soft.  No significant right upper quadrant pain is noted to palpation.  Difficult to assess for hepatosplenomegaly.  No rigidity is noted.  CT scan and ultrasound images personally reviewed  Assessment/Plan: Impression: Perforated acute cholecystitis with cholelithiasis.  Is difficult to determine exactly when this occurred.  He has no leukocytosis and is not septic.  His hemoglobin is relatively good.  He does have a total elevated bilirubin secondary to the cholecystitis.  His liver cirrhosis appears to be well compensated.  He has a normal INR. He is a high risk surgical candidate for cholecystectomy or exploration due to his cirrhosis.  I did discuss the situation with interventional  radiology.  They feel that he has not a good candidate for cholecystostomy tube at this time as the ability to drain the contracted gallbladder would be low yield. I did discuss the situation with the patient.  At this point, would admit the patient for IV antibiotics and monitoring.  He is a risk of developing the right upper quadrant abscess and should that occur, would reimage him and possibly percutaneously drain him at that time.  He is fully aware that he is a high risk candidate for any surgical intervention.  Will follow with you.  Franky Macho 09/25/2018, 1:27 PM

## 2018-09-26 DIAGNOSIS — R17 Unspecified jaundice: Secondary | ICD-10-CM

## 2018-09-26 DIAGNOSIS — K710 Toxic liver disease with cholestasis: Secondary | ICD-10-CM

## 2018-09-26 DIAGNOSIS — K7460 Unspecified cirrhosis of liver: Secondary | ICD-10-CM

## 2018-09-26 DIAGNOSIS — D631 Anemia in chronic kidney disease: Secondary | ICD-10-CM

## 2018-09-26 DIAGNOSIS — K82A2 Perforation of gallbladder in cholecystitis: Secondary | ICD-10-CM

## 2018-09-26 LAB — CBC WITH DIFFERENTIAL/PLATELET
Abs Immature Granulocytes: 0.07 10*3/uL (ref 0.00–0.07)
BASOS ABS: 0 10*3/uL (ref 0.0–0.1)
Basophils Relative: 0 %
Eosinophils Absolute: 0 10*3/uL (ref 0.0–0.5)
Eosinophils Relative: 0 %
HCT: 36.6 % — ABNORMAL LOW (ref 39.0–52.0)
Hemoglobin: 11.8 g/dL — ABNORMAL LOW (ref 13.0–17.0)
Immature Granulocytes: 1 %
Lymphocytes Relative: 11 %
Lymphs Abs: 1 10*3/uL (ref 0.7–4.0)
MCH: 30.9 pg (ref 26.0–34.0)
MCHC: 32.2 g/dL (ref 30.0–36.0)
MCV: 95.8 fL (ref 80.0–100.0)
Monocytes Absolute: 1.1 10*3/uL — ABNORMAL HIGH (ref 0.1–1.0)
Monocytes Relative: 13 %
NEUTROS PCT: 75 %
Neutro Abs: 6.7 10*3/uL (ref 1.7–7.7)
Platelets: 97 10*3/uL — ABNORMAL LOW (ref 150–400)
RBC: 3.82 MIL/uL — ABNORMAL LOW (ref 4.22–5.81)
RDW: 14.2 % (ref 11.5–15.5)
WBC: 8.8 10*3/uL (ref 4.0–10.5)
nRBC: 0 % (ref 0.0–0.2)

## 2018-09-26 LAB — COMPREHENSIVE METABOLIC PANEL
ALT: 41 U/L (ref 0–44)
AST: 43 U/L — ABNORMAL HIGH (ref 15–41)
Albumin: 3.1 g/dL — ABNORMAL LOW (ref 3.5–5.0)
Alkaline Phosphatase: 107 U/L (ref 38–126)
Anion gap: 8 (ref 5–15)
BILIRUBIN TOTAL: 5.1 mg/dL — AB (ref 0.3–1.2)
BUN: 27 mg/dL — AB (ref 8–23)
CO2: 24 mmol/L (ref 22–32)
Calcium: 8.5 mg/dL — ABNORMAL LOW (ref 8.9–10.3)
Chloride: 106 mmol/L (ref 98–111)
Creatinine, Ser: 1.84 mg/dL — ABNORMAL HIGH (ref 0.61–1.24)
GFR calc Af Amer: 45 mL/min — ABNORMAL LOW (ref >60)
GFR, EST NON AFRICAN AMERICAN: 38 mL/min — AB (ref >60)
Glucose, Bld: 191 mg/dL — ABNORMAL HIGH (ref 70–99)
Potassium: 4.2 mmol/L (ref 3.5–5.1)
Sodium: 138 mmol/L (ref 135–145)
TOTAL PROTEIN: 6.7 g/dL (ref 6.5–8.1)

## 2018-09-26 LAB — BILIRUBIN, DIRECT: Bilirubin, Direct: 2.5 mg/dL — ABNORMAL HIGH (ref 0.0–0.2)

## 2018-09-26 LAB — GLUCOSE, CAPILLARY
GLUCOSE-CAPILLARY: 185 mg/dL — AB (ref 70–99)
Glucose-Capillary: 182 mg/dL — ABNORMAL HIGH (ref 70–99)
Glucose-Capillary: 193 mg/dL — ABNORMAL HIGH (ref 70–99)
Glucose-Capillary: 143 mg/dL — ABNORMAL HIGH (ref 70–99)

## 2018-09-26 LAB — LIPASE, BLOOD: LIPASE: 30 U/L (ref 11–51)

## 2018-09-26 MED ORDER — HYDROMORPHONE HCL 1 MG/ML IJ SOLN
1.0000 mg | INTRAMUSCULAR | Status: DC | PRN
  Administered 2018-09-26 – 2018-09-27 (×11): 1 mg via INTRAVENOUS
  Filled 2018-09-26 (×12): qty 1

## 2018-09-26 MED ORDER — KETOROLAC TROMETHAMINE 30 MG/ML IJ SOLN
30.0000 mg | Freq: Once | INTRAMUSCULAR | Status: AC
  Administered 2018-09-26: 30 mg via INTRAVENOUS
  Filled 2018-09-26: qty 1

## 2018-09-26 MED ORDER — HYDROMORPHONE HCL 1 MG/ML IJ SOLN
2.0000 mg | Freq: Once | INTRAMUSCULAR | Status: AC
  Administered 2018-09-26: 2 mg via INTRAVENOUS
  Filled 2018-09-26: qty 2

## 2018-09-26 MED ORDER — HYDROMORPHONE HCL 1 MG/ML IJ SOLN
1.0000 mg | Freq: Once | INTRAMUSCULAR | Status: AC
  Administered 2018-09-26: 1 mg via INTRAVENOUS

## 2018-09-26 NOTE — Progress Notes (Signed)
Patient Demographics:    [name] [name], is a 63 y.o. male, DOB - [birth year], AVW:[id number]  Admit date - 09/24/2018   Admitting Physician [name], MD  Outpatient Primary MD for the patient is [name], MD  LOS - 1   Chief Complaint  Patient presents with  . 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 placement for refractory ascites a year later and history of asymptomatic cholelithiasis and on 09/25/2018 with perforated gallbladder currently on IV Zosyn   Plan:- 1)Calculus Cholecystitis with Perforation----apparently not a surgical candidate, also not a candidate for IR cholecystectomy tube placement at this time, continue IV Zosyn started 09/25/2018, surgical consult appreciated, GI consult appreciated, bilirubin trending up  2)History of Cryptogenic Cirrhosis status post prior TIPS in 2007----no encephalopathy, monitor closely  3)DM2--- no recent A1c, use Amaryl with breakfast, continue with sliding scale coverage      4)HTN--- stable, continue Coreg 6.25 mg twice daily, losartan 100 mg daily,  Disposition/Need for in-Hospital Stay- patient unable to be discharged at this time due to   Code Status :  Full   Family Communication:   Wife at bedside  Disposition Plan  :  TBD  Consults  :  Gen Surgery/Gi---  DVT Prophylaxis  :   SCDs   Lab Results  Component Value Date    PLT 97 (L) 09/26/2018    Inpatient Medications  Scheduled Meds: . carvedilol  6.25 mg Oral BID WC  . furosemide  40 mg Oral Daily  . glimepiride  4 mg Oral Q breakfast  . insulin aspart  0-15 Units Subcutaneous TID WC  . insulin aspart  0-5 Units Subcutaneous QHS  . losartan  100 mg Oral Daily  . pantoprazole  40 mg Oral Daily   Continuous Infusions: . famotidine (PEPCID) IV 20 mg (09/26/18 1059)  . lactated ringers 125 mL/hr at 09/26/18 1245  . piperacillin-tazobactam (ZOSYN)  IV 3.375 g (09/26/18 1714)   PRN Meds:.acetaminophen **OR** acetaminophen, HYDROmorphone (DILAUDID) injection, ondansetron **OR** ondansetron (ZOFRAN) IV  Anti-infectives (From admission, onward)   Start     Dose/Rate Route Frequency Ordered Stop   09/25/18 1000  piperacillin-tazobactam (ZOSYN) IVPB 3.375 g     3.375 g 12.5 mL/hr over 240 Minutes Intravenous Every 8 hours 09/25/18 0249     09/25/18 0600  piperacillin-tazobactam (ZOSYN) IVPB 3.375 g  Status:  Discontinued     3.375 g 100 mL/hr over 30 Minutes Intravenous Every 8 hours 09/25/18 0245 09/25/18 0250   09/25/18 0215  piperacillin-tazobactam (ZOSYN) IVPB 3.375 g     3.375 g 100 mL/hr over 30 Minutes Intravenous  Once 09/25/18 0206 09/25/18 0246        Objective:   Vitals:  09/25/18 2210 09/26/18 0546 09/26/18 1356 09/26/18 1412  BP: (!) 175/60 (!) 166/80  115/62  Pulse: 93 66  (!) 59  Resp: 16   20  Temp: (!) 101.3 F (38.5 C) 98.2 F (36.8 C)  (!) 97.5 F (36.4 C)  TempSrc: Oral Oral  Oral  SpO2: 96% 98% 95% 100%  Weight:      Height:        Wt Readings from Last 3 Encounters:  09/24/18 108.9 kg  12/28/16 110.2 kg  11/11/14 112.6 kg     Intake/Output Summary (Last 24 hours) at 09/26/2018 1821 Last data filed at 09/26/2018 1449 Gross per 24 hour  Intake 150 ml  Output 700 ml  Net -550 ml     Physical Exam Patient is examined daily including today on 09/26/18 , exams remain the same as of yesterday except that has  changed   Gen:- Awake Alert,  HEENT:- [place].AT, No sclera icterus Neck-Supple Neck,No JVD,.  Lungs-  CTAB , fair symmetrical air movement CV- S1, S2 normal, regular Abd-  +ve B.Sounds, Abd Soft, right upper quadrant tenderness, no CVA area tenderness    Extremity/Skin:- No  edema, pedal pulses present  Psych-affect is appropriate, oriented x3 Neuro-no new focal deficits, no tremors   Data Review:   Micro Results No results found for this or any previous visit (from the past 240 hour(s)).  Radiology Reports [place] Abdomen Complete  Result Date: 09/25/2018 CLINICAL DATA:  Cirrhosis.  Upper abdominal pain EXAM: ABDOMEN ULTRASOUND COMPLETE COMPARISON:  None. FINDINGS: Gallbladder: There is a 10 mm in size calculus adherent in the neck of the gallbladder. Gallbladder contains significant sludge. The gallbladder wall is diffusely thickened. There is no pericholecystic fluid. No sonographic Murphy sign noted by sonographer. Common bile duct: Diameter: 4 mm. No demonstrable intrahepatic, common hepatic, or common bile duct dilatation. Liver: No focal lesion identified. Liver has a nodular contour with diffuse increased echogenicity. A TIPS catheter is present and noted to be patent. Portal vein is patent on color Doppler imaging with normal direction of blood flow towards the liver. IVC: No abnormality visualized in visualized regions. Much of the infrahepatic inferior vena cava is obscured by gas. Pancreas: Visualized portion unremarkable. Most of the pancreas is obscured by gas. Spleen: Size and appearance within normal limits. Spleen measures 12.1 cm. Right Kidney: Length: 11.7 cm. Echogenicity within normal limits. No mass or hydronephrosis visualized. Left Kidney: Length: 12.7 cm. Echogenicity within normal limits. No mass or hydronephrosis visualized. Abdominal aorta: No aneurysm visualized in visualized regions. Much of the aorta is obscured by gas. Other findings: There is a mild degree of ascites  present. IMPRESSION: 1. Gallstone adherent in the neck of the gallbladder. Sludge throughout gallbladder. Gallbladder wall is thickened. While gallbladder wall thickening may be seen in association with ascites, this finding in association with the gallstone adherent in the neck of the gallbladder raises concern for a degree of acute cholecystitis. It may be prudent to consider nuclear medicine hepatobiliary imaging study to assess for cystic duct patency. 2. Appearance of the liver is indicative of hepatic cirrhosis. While no focal liver lesions are evident, it must be cautioned that the sensitivity of ultrasound for detection of focal liver lesions is diminished significantly in this circumstance. TIPS catheter is present, patent. The flow in the portal vein is in the anatomic direction. 3. Most of the pancreas is obscured by gas. Much of the inferior vena cava and aorta are obscured by gas. 4.  Spleen  appears toward the upper limits of normal in size. Electronically Signed   By: Bretta Bang III M.D.   On: 09/25/2018 09:22   Ct Abdomen Pelvis W Contrast  Result Date: 09/25/2018 CLINICAL DATA:  Upper abdominal pain with nausea and vomiting EXAM: CT ABDOMEN AND PELVIS WITH CONTRAST TECHNIQUE: Multidetector CT imaging of the abdomen and pelvis was performed using the standard protocol following bolus administration of intravenous contrast. CONTRAST:  OMNIPAQUE IOHEXOL 300 MG/ML  SOLN COMPARISON:  CT 01/01/2006 FINDINGS: Lower chest: Lung bases demonstrate no acute consolidation or effusion. The heart size is within normal limits. Small hiatal hernia Hepatobiliary: Cirrhotic morphology of the liver. Tips shunt is in place. No focal hepatic abnormality or biliary dilatation. Abnormal appearance of gallbladder with high density intraluminal material and hazy edema around the gallbladder lumen. Stone at the gallbladder neck. High density fluid along the inferior hepatic margin suspicious for  hemoperitoneum. Rounded calcification in the fluid, series 2, image number 27, possibly a gallstone. Pancreas: Unremarkable. No pancreatic ductal dilatation or surrounding inflammatory changes. Spleen: Normal in size without focal abnormality. Adrenals/Urinary Tract: Adrenal glands are unremarkable. Kidneys are normal, without renal calculi, focal lesion, or hydronephrosis. Bladder is unremarkable. Stomach/Bowel: Stomach is within normal limits. Appendix appears normal. No evidence of bowel wall thickening, distention, or inflammatory changes. Vascular/Lymphatic: Nonaneurysmal aorta. Mild aortic atherosclerosis. No significantly enlarged lymph nodes Reproductive: Slightly enlarged prostate with calcification Other: No free air. Small slightly hyperdense fluid within the pelvis with more moderate fluid in the right upper quadrant adjacent to the liver. Fat containing inguinal hernias. Musculoskeletal: Advanced degenerative changes of the spine. No acute osseous abnormality. IMPRESSION: 1. Abnormal appearance of the gallbladder which contains intraluminal high density material. There is hazy edema and or fluid around the gallbladder with a stone present in the gallbladder neck. Findings could be secondary to an acute cholecystitis. Additional finding of hemorrhagic fluid along the inferior margin of the liver with rounded calcifications in the hemorrhagic fluid, question gallbladder perforation with extravasated stones. Associated hemoperitoneum. 2. Cirrhosis of the liver. 3. Small amount of hemorrhagic ascites within the pelvis. Electronically Signed   By: Jasmine Pang M.D.   On: 09/25/2018 02:02     CBC Recent Labs  Lab 09/24/18 2350 09/25/18 0714 09/26/18 0538  WBC 11.6* 11.6* 8.8  HGB 13.2 12.1* 11.8*  HCT 40.2 36.8* 36.6*  PLT 106* 97* 97*  MCV 92.8 94.4 95.8  MCH 30.5 31.0 30.9  MCHC 32.8 32.9 32.2  RDW 13.5 13.9 14.2  LYMPHSABS 0.7 1.0 1.0  MONOABS 1.0 1.0 1.1*  EOSABS 0.1 0.1 0.0  BASOSABS  0.0 0.0 0.0    Chemistries  Recent Labs  Lab 09/24/18 2350 09/25/18 0714 09/26/18 0538  NA 137 138 138  K 4.9 4.8 4.2  CL 108 109 106  CO2 21* 21* 24  GLUCOSE 240* 248* 191*  BUN 18 21 27*  CREATININE 1.26* 1.44* 1.84*  CALCIUM 8.6* 8.4* 8.5*  MG 2.0  --   --   AST 35 35 43*  ALT 40 37 41  ALKPHOS 132* 109 107  BILITOT 3.1* 2.6* 5.1*   ------------------------------------------------------------------------------------------------------------------ No results for input(s): CHOL, HDL, LDLCALC, TRIG, CHOLHDL, LDLDIRECT in the last 72 hours.  No results found for: HGBA1C ------------------------------------------------------------------------------------------------------------------ No results for input(s): TSH, T4TOTAL, T3FREE, THYROIDAB in the last 72 hours.  Invalid input(s): FREET3 ------------------------------------------------------------------------------------------------------------------ No results for input(s): VITAMINB12, FOLATE, FERRITIN, TIBC, IRON, RETICCTPCT in the last 72 hours.  Coagulation profile Recent Labs  Lab 09/25/18 0954  INR 1.06    No results for input(s): DDIMER in the last 72 hours.  Cardiac Enzymes No results for input(s): CKMB, TROPONINI, MYOGLOBIN in the last 168 hours.  Invalid input(s): CK ------------------------------------------------------------------------------------------------------------------ No results found for: BNP   Shon Hale M.D on 09/26/2018 at 6:21 PM  Go to [email or web address] - for contact info  [facility] - Office  [phone]

## 2018-09-26 NOTE — Progress Notes (Signed)
Patient was screaming 10 out of 10 pain at 0200 PRN dilaudid was given. Patient stated pain medication was not working and pain was getting worse. Paged MD and got order for 1mg  of dilaudid and Toradol. Patient stated pain was better, now sleeping in room.

## 2018-09-26 NOTE — Progress Notes (Signed)
Subjective: Patient had episode of right shoulder pain.  He became very anxious.  He currently is not having any abdominal pain.  Objective: Vital signs in last 24 hours: Temp:  [98.2 F (36.8 C)-101.3 F (38.5 C)] 98.2 F (36.8 C) (02/21 0546) Pulse Rate:  [66-93] 66 (02/21 0546) Resp:  [14-25] 16 (02/20 2210) BP: (126-175)/(49-80) 166/80 (02/21 0546) SpO2:  [93 %-100 %] 98 % (02/21 0546) Last BM Date: 09/24/18  Intake/Output from previous day: 02/20 0701 - 02/21 0700 In: 250 [IV Piggyback:250] Out: 400 [Urine:400] Intake/Output this shift: No intake/output data recorded.  General appearance: alert, cooperative and no distress GI: Soft, not particularly tender in the right upper quadrant.  Patient does have a rotund abdomen.  No rigidity is noted.  Bowel sounds present.  Lab Results:  Recent Labs    09/25/18 0714 09/26/18 0538  WBC 11.6* 8.8  HGB 12.1* 11.8*  HCT 36.8* 36.6*  PLT 97* 97*   BMET Recent Labs    09/25/18 0714 09/26/18 0538  NA 138 138  K 4.8 4.2  CL 109 106  CO2 21* 24  GLUCOSE 248* 191*  BUN 21 27*  CREATININE 1.44* 1.84*  CALCIUM 8.4* 8.5*   PT/INR Recent Labs    09/25/18 0954  LABPROT 13.7  INR 1.06    Studies/Results: US Abdomen Complete  Result Date: 09/25/2018 CLINICAL DATA:  Cirrhosis.  Upper abdominal pain EXAM: ABDOMEN ULTRASOUND COMPLETE COMPARISON:  None. FINDINGS: Gallbladder: There is a 10 mm in size calculus adherent in the neck of the gallbladder. Gallbladder contains significant sludge. The gallbladder wall is diffusely thickened. There is no pericholecystic fluid. No sonographic Murphy sign noted by sonographer. Common bile duct: Diameter: 4 mm. No demonstrable intrahepatic, common hepatic, or common bile duct dilatation. Liver: No focal lesion identified. Liver has a nodular contour with diffuse increased echogenicity. A TIPS catheter is present and noted to be patent. Portal vein is patent on color Doppler imaging with  normal direction of blood flow towards the liver. IVC: No abnormality visualized in visualized regions. Much of the infrahepatic inferior vena cava is obscured by gas. Pancreas: Visualized portion unremarkable. Most of the pancreas is obscured by gas. Spleen: Size and appearance within normal limits. Spleen measures 12.1 cm. Right Kidney: Length: 11.7 cm. Echogenicity within normal limits. No mass or hydronephrosis visualized. Left Kidney: Length: 12.7 cm. Echogenicity within normal limits. No mass or hydronephrosis visualized. Abdominal aorta: No aneurysm visualized in visualized regions. Much of the aorta is obscured by gas. Other findings: There is a mild degree of ascites present. IMPRESSION: 1. Gallstone adherent in the neck of the gallbladder. Sludge throughout gallbladder. Gallbladder wall is thickened. While gallbladder wall thickening may be seen in association with ascites, this finding in association with the gallstone adherent in the neck of the gallbladder raises concern for a degree of acute cholecystitis. It may be prudent to consider nuclear medicine hepatobiliary imaging study to assess for cystic duct patency. 2. Appearance of the liver is indicative of hepatic cirrhosis. While no focal liver lesions are evident, it must be cautioned that the sensitivity of ultrasound for detection of focal liver lesions is diminished significantly in this circumstance. TIPS catheter is present, patent. The flow in the portal vein is in the anatomic direction. 3. Most of the pancreas is obscured by gas. Much of the inferior vena cava and aorta are obscured by gas. 4.  Spleen appears toward the upper limits of normal in size. Electronically Signed   By:  Bretta Bang III M.D.   On: 09/25/2018 09:22   Ct Abdomen Pelvis W Contrast  Result Date: 09/25/2018 CLINICAL DATA:  Upper abdominal pain with nausea and vomiting EXAM: CT ABDOMEN AND PELVIS WITH CONTRAST TECHNIQUE: Multidetector CT imaging of the abdomen  and pelvis was performed using the standard protocol following bolus administration of intravenous contrast. CONTRAST:  OMNIPAQUE IOHEXOL 300 MG/ML  SOLN COMPARISON:  CT 01/01/2006 FINDINGS: Lower chest: Lung bases demonstrate no acute consolidation or effusion. The heart size is within normal limits. Small hiatal hernia Hepatobiliary: Cirrhotic morphology of the liver. Tips shunt is in place. No focal hepatic abnormality or biliary dilatation. Abnormal appearance of gallbladder with high density intraluminal material and hazy edema around the gallbladder lumen. Stone at the gallbladder neck. High density fluid along the inferior hepatic margin suspicious for hemoperitoneum. Rounded calcification in the fluid, series 2, image number 27, possibly a gallstone. Pancreas: Unremarkable. No pancreatic ductal dilatation or surrounding inflammatory changes. Spleen: Normal in size without focal abnormality. Adrenals/Urinary Tract: Adrenal glands are unremarkable. Kidneys are normal, without renal calculi, focal lesion, or hydronephrosis. Bladder is unremarkable. Stomach/Bowel: Stomach is within normal limits. Appendix appears normal. No evidence of bowel wall thickening, distention, or inflammatory changes. Vascular/Lymphatic: Nonaneurysmal aorta. Mild aortic atherosclerosis. No significantly enlarged lymph nodes Reproductive: Slightly enlarged prostate with calcification Other: No free air. Small slightly hyperdense fluid within the pelvis with more moderate fluid in the right upper quadrant adjacent to the liver. Fat containing inguinal hernias. Musculoskeletal: Advanced degenerative changes of the spine. No acute osseous abnormality. IMPRESSION: 1. Abnormal appearance of the gallbladder which contains intraluminal high density material. There is hazy edema and or fluid around the gallbladder with a stone present in the gallbladder neck. Findings could be secondary to an acute cholecystitis. Additional finding of  hemorrhagic fluid along the inferior margin of the liver with rounded calcifications in the hemorrhagic fluid, question gallbladder perforation with extravasated stones. Associated hemoperitoneum. 2. Cirrhosis of the liver. 3. Small amount of hemorrhagic ascites within the pelvis. Electronically Signed   By: Jasmine Pang M.D.   On: 09/25/2018 02:02    Anti-infectives: Anti-infectives (From admission, onward)   Start     Dose/Rate Route Frequency Ordered Stop   09/25/18 1000  piperacillin-tazobactam (ZOSYN) IVPB 3.375 g     3.375 g 12.5 mL/hr over 240 Minutes Intravenous Every 8 hours 09/25/18 0249     09/25/18 0600  piperacillin-tazobactam (ZOSYN) IVPB 3.375 g  Status:  Discontinued     3.375 g 100 mL/hr over 30 Minutes Intravenous Every 8 hours 09/25/18 0245 09/25/18 0250   09/25/18 0215  piperacillin-tazobactam (ZOSYN) IVPB 3.375 g     3.375 g 100 mL/hr over 30 Minutes Intravenous  Once 09/25/18 0206 09/25/18 0246      Assessment/Plan: Impression: Perforated cholecystitis with cholelithiasis, cirrhosis.  The patient may be having referred pain from his right hemidiaphragm.  He is hemodynamically stable and the only thing remarkable on his liver enzyme tests is his total bilirubin which has risen.  He has no leukocytosis and his hemoglobin is stable.  I have asked Dr. Karilyn Cota to consult on the patient.  He does appear improved since yesterday.  Would continue current management at this time.  . LOS: 1 day    Franky Macho 09/26/2018

## 2018-09-26 NOTE — Consult Note (Signed)
Referring Provider: Franky Macho, MD and Shon Hale, Primary Care Physician:  [name], MD Primary Gastroenterologist:  Dr. Karilyn Cota  Reason for Consultation:    Jaundice in a patient with perforated cholecystitis.  HPI:   Patient is 63 year old Caucasian male with biopsy-proven cryptogenic cirrhosis dating back to 2006 status post TIPS placement for refractory ascites a year later and history of asymptomatic cholelithiasis who presented to emergency room with midepigastric and lower sternal pain associated nausea vomiting and low-grade fever on the evening of 09/24/2018.  Patient underwent abdominal pelvic CT which revealed thickened gallbladder wall high density liquid in the gallbladder as well as surrounding liver felt to be blood gallstone in neck of gallbladder and 2 stones outside the gallbladder.  Patient was felt to have acute cholecystitis with perforation.  He was hospitalized and begun on Zosyn and pain management. Patient states that he has been having intermittent epigastric pain for the last 1 year.  He describes his pain to be gas pain not associated with nausea vomiting fever chills or inability to eat.  He states pain would last few minutes and go away.  However for 3 days prior to admission he noted constant pain with worsening intensity.  Pain was intense prompting him to come to emergency room.  He did have few loose bowel movements prior to coming to emergency room.  He has not had any movement since then.  He has had good appetite and he has not lost any weight. This afternoon he complains of pain primarily in his right shoulder which started yesterday.  He has no abdominal pain at the present time. He had upper abdominal ultrasound yesterday revealing gallbladder wall thickening with sludge in the gallbladder gallstone in the neck of the gallbladder cirrhotic liver and splenomegaly.  TIPS in place. He is on omeprazole for GERD but he does not take any NSAIDs. He is  up-to-date on screening for CRC.  Last ultrasound was on 08/13/2018 not revealed any focal abnormalities to the liver.  He is single and does not have any children.  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  Now he is disabled.  He has not had any alcohol in the last 14 years.  Prior to that he would drink socially but not daily.  He smokes a pack of cigarettes per day. His mother is in her mid 58s and father died of MI in his 15s.  He has 2 sisters in good health.   Past Medical History:  Diagnosis Date  . Anemia   . Cryptogenic cirrhosis (HCC)   . Diabetes mellitus   . GERD (gastroesophageal reflux disease)   . Hypertension         Past Surgical History:  Procedure Laterality Date  . COLONOSCOPY    . INCISION AND DRAINAGE OF WOUND Right 12/28/2016   Procedure: IRRIGATION AND DEBRIDEMENT WOUND;  Surgeon: Betha Loa, MD;  Location: [facility];  Service: Orthopedics;  Laterality: Right;  . LIVER BIOPSY    . NERVE, TENDON AND ARTERY REPAIR Right 12/28/2016   Procedure: NERVE, TENDON AND ARTERY REPAIR;  Surgeon: Betha Loa, MD;  Location: [facility];  Service: Orthopedics;  Laterality: Right;  . PERCUTANEOUS PINNING Right 12/28/2016   Procedure: PERCUTANEOUS PINNING EXTREMITY;  Surgeon: Betha Loa, MD;  Location: [facility];  Service: Orthopedics;  Laterality: Right;  . TIPS PROCEDURE  2008  . UPPER GASTROINTESTINAL ENDOSCOPY      Prior to Admission medications   Medication Sig Start  Date End Date Taking? Authorizing Provider  carvedilol (COREG) 6.25 MG tablet Take 6.25 mg by mouth 2 (two) times daily with a meal.   Yes [provider]  Cyanocobalamin (VITAMIN B-12 IJ) Inject as directed once a week. Currently on starter - for 6 weeks then monthly.Currently in 4 th week   Yes [provider]  furosemide (LASIX) 40 MG tablet TAKE 1 TABLET BY MOUTH EVERYDAY OR EVERY OTHER DAY. 09/03/14  Yes Setzer, Terri L, NP  glimepiride (AMARYL) 4 MG  tablet Take 4 mg by mouth daily before breakfast.   Yes [provider]  losartan (COZAAR) 100 MG tablet Take 100 mg by mouth daily.   Yes [provider]  omeprazole (PRILOSEC) 20 MG capsule TAKE 1 CAPSULE BY MOUTH EVERY MORNING FOR ACID REFLUX. 07/05/14  Yes Rehman, Joline Maxcy, MD  oxyCODONE (ROXICODONE) 5 MG immediate release tablet 1-2 tabs po q6 hours prn pain Patient not taking: Reported on 09/25/2018 12/28/16   Betha Loa, MD  sulfamethoxazole-trimethoprim (BACTRIM DS) 800-160 MG tablet Take 1 tablet by mouth 2 (two) times daily. Patient not taking: Reported on 09/25/2018 12/28/16   Betha Loa, MD    Current Facility-Administered Medications  Medication Dose Route Frequency Provider Last Rate Last Dose  . acetaminophen (TYLENOL) tablet 650 mg  650 mg Oral Q6H PRN [name], MD       Or  . acetaminophen (TYLENOL) suppository 650 mg  650 mg Rectal Q6H PRN [name], MD      . carvedilol (COREG) tablet 6.25 mg  6.25 mg Oral BID WC Dhungel, Nishant, MD   6.25 mg at 09/26/18 0841  . famotidine (PEPCID) IVPB 20 mg premix  20 mg Intravenous Q12H [name], MD 100 mL/hr at 09/26/18 1059 20 mg at 09/26/18 1059  . furosemide (LASIX) tablet 40 mg  40 mg Oral Daily Dhungel, Nishant, MD   40 mg at 09/26/18 1052  . glimepiride (AMARYL) tablet 4 mg  4 mg Oral Q breakfast Dhungel, Nishant, MD      . HYDROmorphone (DILAUDID) injection 1 mg  1 mg Intravenous Q2H PRN [name], MD   1 mg at 09/26/18 1518  . insulin aspart (novoLOG) injection 0-15 Units  0-15 Units Subcutaneous TID WC Bodenheimer, Charles A, NP   3 Units at 09/26/18 1240  . insulin aspart (novoLOG) injection 0-5 Units  0-5 Units Subcutaneous QHS Bodenheimer, Charles A, NP      . lactated ringers infusion   Intravenous Continuous [name], MD 125 mL/hr at 09/26/18 1245    . losartan (COZAAR) tablet 100 mg  100 mg Oral Daily Dhungel, Nishant, MD   100 mg at 09/26/18 1052  .  ondansetron (ZOFRAN) tablet 4 mg  4 mg Oral Q6H PRN [name], MD       Or  . ondansetron [facility]) injection 4 mg  4 mg Intravenous Q6H PRN [name], MD      . pantoprazole (PROTONIX) EC tablet 40 mg  40 mg Oral Daily Dhungel, Nishant, MD   40 mg at 09/26/18 1052  . piperacillin-tazobactam (ZOSYN) IVPB 3.375 g  3.375 g Intravenous Q8H [name], MD 12.5 mL/hr at 09/26/18 1056 3.375 g at 09/26/18 1056    Allergies as of 09/24/2018  . (No Known Allergies)    Family History  Problem Relation Age of Onset  . Healthy Mother   . Diabetes Father   . Heart disease Father   .  Healthy Sister   . Healthy Sister     Social History   Socioeconomic History  . Marital status: Single    Spouse name: Not on file  . Number of children: Not on file  . Years of education: Not on file  . Highest education level: Not on file  Occupational History  . Not on file  Social Needs  . Financial resource strain: Not on file  . Food insecurity:    Worry: Not on file    Inability: Not on file  . Transportation needs:    Medical: Not on file    Non-medical: Not on file  Tobacco Use  . Smoking status: Current Every Day Smoker    Packs/day: 1.00    Years: 40.00    Pack years: 40.00    Types: Cigarettes  . Smokeless tobacco: Never Used  . Tobacco comment: patient states that in the 40 years he has stopped smoking then start again  Substance and Sexual Activity  . Alcohol use: No    Alcohol/week: 0.0 standard drinks  . Drug use: No  . Sexual activity: Not on file  Lifestyle  . Physical activity:    Days per week: Not on file    Minutes per session: Not on file  . Stress: Not on file  Relationships  . Social connections:    Talks on phone: Not on file    Gets together: Not on file    Attends religious service: Not on file    Active member of club or organization: Not on file    Attends meetings of clubs or organizations: Not on file    Relationship status: Not  on file  . Intimate partner violence:    Fear of current or ex partner: Not on file    Emotionally abused: Not on file    Physically abused: Not on file    Forced sexual activity: Not on file  Other Topics Concern  . Not on file  Social History Narrative  . Not on file    Review of Systems: See HPI, otherwise normal ROS  Physical Exam: Temp:  [97.5 F (36.4 C)-101.3 F (38.5 C)] 97.5 F (36.4 C) (02/21 1412) Pulse Rate:  [59-93] 59 (02/21 1412) Resp:  [14-25] 20 (02/21 1412) BP: (115-175)/(49-80) 115/62 (02/21 1412) SpO2:  [93 %-100 %] 100 % (02/21 1412) Last BM Date: 09/24/18  Patient is alert and in no acute distress. Conjunctiva is pink.  Sclera is mildly icteric. Oropharyngeal mucosa is normal. No neck masses or thyromegaly noted. Cardiac exam with regular rhythm normal S1 and S2.  No murmur gallop noted. Auscultation lungs reveal vesicular breath sounds bilaterally. Abdomen is full but somewhat tense.  Bowel sounds are normal.  On palpation he has mild tenderness in midepigastric region on deep palpation.  No organomegaly or masses. No peripheral edema or clubbing noted.  Lab Results: Recent Labs    09/24/18 2350 09/25/18 0714 09/26/18 0538  WBC 11.6* 11.6* 8.8  HGB 13.2 12.1* 11.8*  HCT 40.2 36.8* 36.6*  PLT 106* 97* 97*   BMET Recent Labs    09/24/18 2350 09/25/18 0714 09/26/18 0538  NA 137 138 138  K 4.9 4.8 4.2  CL 108 109 106  CO2 21* 21* 24  GLUCOSE 240* 248* 191*  BUN 18 21 27*  CREATININE 1.26* 1.44* 1.84*  CALCIUM 8.6* 8.4* 8.5*   LFT Recent Labs    09/26/18 0538  PROT 6.7  ALBUMIN 3.1*  AST 43*  ALT 41  ALKPHOS 107  BILITOT 5.1*  BILIDIR 2.5*   PT/INR Recent Labs    09/25/18 0954  LABPROT 13.7  INR 1.06   Hepatitis Panel No results for input(s): HEPBSAG, HCVAB, HEPAIGM, HEPBIGM in the last 72 hours.  Studies/Results: US Abdomen Complete  Result Date: 09/25/2018 CLINICAL DATA:  Cirrhosis.  Upper abdominal pain EXAM:  ABDOMEN ULTRASOUND COMPLETE COMPARISON:  None. FINDINGS: Gallbladder: There is a 10 mm in size calculus adherent in the neck of the gallbladder. Gallbladder contains significant sludge. The gallbladder wall is diffusely thickened. There is no pericholecystic fluid. No sonographic Murphy sign noted by sonographer. Common bile duct: Diameter: 4 mm. No demonstrable intrahepatic, common hepatic, or common bile duct dilatation. Liver: No focal lesion identified. Liver has a nodular contour with diffuse increased echogenicity. A TIPS catheter is present and noted to be patent. Portal vein is patent on color Doppler imaging with normal direction of blood flow towards the liver. IVC: No abnormality visualized in visualized regions. Much of the infrahepatic inferior vena cava is obscured by gas. Pancreas: Visualized portion unremarkable. Most of the pancreas is obscured by gas. Spleen: Size and appearance within normal limits. Spleen measures 12.1 cm. Right Kidney: Length: 11.7 cm. Echogenicity within normal limits. No mass or hydronephrosis visualized. Left Kidney: Length: 12.7 cm. Echogenicity within normal limits. No mass or hydronephrosis visualized. Abdominal aorta: No aneurysm visualized in visualized regions. Much of the aorta is obscured by gas. Other findings: There is a mild degree of ascites present. IMPRESSION: 1. Gallstone adherent in the neck of the gallbladder. Sludge throughout gallbladder. Gallbladder wall is thickened. While gallbladder wall thickening may be seen in association with ascites, this finding in association with the gallstone adherent in the neck of the gallbladder raises concern for a degree of acute cholecystitis. It may be prudent to consider nuclear medicine hepatobiliary imaging study to assess for cystic duct patency. 2. Appearance of the liver is indicative of hepatic cirrhosis. While no focal liver lesions are evident, it must be cautioned that the sensitivity of ultrasound for  detection of focal liver lesions is diminished significantly in this circumstance. TIPS catheter is present, patent. The flow in the portal vein is in the anatomic direction. 3. Most of the pancreas is obscured by gas. Much of the inferior vena cava and aorta are obscured by gas. 4.  Spleen appears toward the upper limits of normal in size. Electronically Signed   By: Bretta Bang III M.D.   On: 09/25/2018 09:22   Ct Abdomen Pelvis W Contrast  Result Date: 09/25/2018 CLINICAL DATA:  Upper abdominal pain with nausea and vomiting EXAM: CT ABDOMEN AND PELVIS WITH CONTRAST TECHNIQUE: Multidetector CT imaging of the abdomen and pelvis was performed using the standard protocol following bolus administration of intravenous contrast. CONTRAST:  OMNIPAQUE IOHEXOL 300 MG/ML  SOLN COMPARISON:  CT 01/01/2006 FINDINGS: Lower chest: Lung bases demonstrate no acute consolidation or effusion. The heart size is within normal limits. Small hiatal hernia Hepatobiliary: Cirrhotic morphology of the liver. Tips shunt is in place. No focal hepatic abnormality or biliary dilatation. Abnormal appearance of gallbladder with high density intraluminal material and hazy edema around the gallbladder lumen. Stone at the gallbladder neck. High density fluid along the inferior hepatic margin suspicious for hemoperitoneum. Rounded calcification in the fluid, series 2, image number 27, possibly a gallstone. Pancreas: Unremarkable. No pancreatic ductal dilatation or surrounding inflammatory changes. Spleen: Normal in size without focal abnormality. Adrenals/Urinary Tract: Adrenal glands are unremarkable. Kidneys  are normal, without renal calculi, focal lesion, or hydronephrosis. Bladder is unremarkable. Stomach/Bowel: Stomach is within normal limits. Appendix appears normal. No evidence of bowel wall thickening, distention, or inflammatory changes. Vascular/Lymphatic: Nonaneurysmal aorta. Mild aortic atherosclerosis. No significantly  enlarged lymph nodes Reproductive: Slightly enlarged prostate with calcification Other: No free air. Small slightly hyperdense fluid within the pelvis with more moderate fluid in the right upper quadrant adjacent to the liver. Fat containing inguinal hernias. Musculoskeletal: Advanced degenerative changes of the spine. No acute osseous abnormality. IMPRESSION: 1. Abnormal appearance of the gallbladder which contains intraluminal high density material. There is hazy edema and or fluid around the gallbladder with a stone present in the gallbladder neck. Findings could be secondary to an acute cholecystitis. Additional finding of hemorrhagic fluid along the inferior margin of the liver with rounded calcifications in the hemorrhagic fluid, question gallbladder perforation with extravasated stones. Associated hemoperitoneum. 2. Cirrhosis of the liver. 3. Small amount of hemorrhagic ascites within the pelvis. Electronically Signed   By: Jasmine Pang M.D.   On: 09/25/2018 02:02   Current imaging studies reviewed along with Dr. Juan Quam and compared with CT from April 06, 2005(only other CT that he has had since current study) findings as above.  Assessment;  Patient is 63 year old Caucasian male who has biopsy-proven cryptogenic cirrhosis dating back to October 2006, status post TIPS in 2007 for refractory ascites with known cholelithiasis now presents with 3-day history of epigastric pain associated with low-grade fever and he has some nausea.  CT revealed gallbladder wall thickening high density material within the gallbladder as well as along inferior and lateral margin the liver felt to be blood.  He also had 2 rounded calcifications outside the gallbladder and one in the neck. These findings would suggest acute cholecystitis with perforation and hemoperitoneum.  Process appears to be localized or contained.  He is presently on Zosyn which would appear to be appropriate coverage for his condition.  He is  at risk for abscess formation and will have to monitor symptoms closely. Patient's bilirubin has been gradually creeping up.  Cholestasis would appear to be due to underlying cirrhosis and perhaps contribution from hemoperitoneum.  Bile duct is not dilated.  There is no indication for endoscopic intervention.  Recommendations;  LFTs and INR in a.m.  Dr. Darrick Penna will will be seeing patient over the weekend for GI issues.   LOS: 1 day   Najeeb Rehman  09/26/2018, 3:24 PM

## 2018-09-27 LAB — BASIC METABOLIC PANEL
Anion gap: 12 (ref 5–15)
BUN: 54 mg/dL — ABNORMAL HIGH (ref 8–23)
CO2: 22 mmol/L (ref 22–32)
Calcium: 8.9 mg/dL (ref 8.9–10.3)
Chloride: 107 mmol/L (ref 98–111)
Creatinine, Ser: 2.56 mg/dL — ABNORMAL HIGH (ref 0.61–1.24)
GFR calc Af Amer: 30 mL/min — ABNORMAL LOW (ref >60)
GFR calc non Af Amer: 26 mL/min — ABNORMAL LOW (ref >60)
Glucose, Bld: 133 mg/dL — ABNORMAL HIGH (ref 70–99)
Potassium: 5 mmol/L (ref 3.5–5.1)
Sodium: 141 mmol/L (ref 135–145)

## 2018-09-27 LAB — CBC WITH DIFFERENTIAL/PLATELET
Abs Immature Granulocytes: 0.12 10*3/uL — ABNORMAL HIGH (ref 0.00–0.07)
Basophils Absolute: 0 10*3/uL (ref 0.0–0.1)
Basophils Relative: 0 %
EOS ABS: 0.1 10*3/uL (ref 0.0–0.5)
EOS PCT: 1 %
HCT: 34.4 % — ABNORMAL LOW (ref 39.0–52.0)
Hemoglobin: 11.2 g/dL — ABNORMAL LOW (ref 13.0–17.0)
Immature Granulocytes: 1 %
Lymphocytes Relative: 8 %
Lymphs Abs: 0.9 10*3/uL (ref 0.7–4.0)
MCH: 31.2 pg (ref 26.0–34.0)
MCHC: 32.6 g/dL (ref 30.0–36.0)
MCV: 95.8 fL (ref 80.0–100.0)
MONO ABS: 1.2 10*3/uL — AB (ref 0.1–1.0)
Monocytes Relative: 11 %
Neutro Abs: 8.9 10*3/uL — ABNORMAL HIGH (ref 1.7–7.7)
Neutrophils Relative %: 79 %
Platelets: 108 10*3/uL — ABNORMAL LOW (ref 150–400)
RBC: 3.59 MIL/uL — ABNORMAL LOW (ref 4.22–5.81)
RDW: 14.6 % (ref 11.5–15.5)
WBC: 11.3 10*3/uL — ABNORMAL HIGH (ref 4.0–10.5)
nRBC: 0 % (ref 0.0–0.2)

## 2018-09-27 LAB — HEPATIC FUNCTION PANEL
ALT: 38 U/L (ref 0–44)
AST: 56 U/L — ABNORMAL HIGH (ref 15–41)
Albumin: 3.1 g/dL — ABNORMAL LOW (ref 3.5–5.0)
Alkaline Phosphatase: 80 U/L (ref 38–126)
BILIRUBIN TOTAL: 14.6 mg/dL — AB (ref 0.3–1.2)
Bilirubin, Direct: 9.3 mg/dL — ABNORMAL HIGH (ref 0.0–0.2)
Indirect Bilirubin: 5.3 mg/dL — ABNORMAL HIGH (ref 0.3–0.9)
Total Protein: 6.9 g/dL (ref 6.5–8.1)

## 2018-09-27 LAB — PROTIME-INR
INR: 1.05
Prothrombin Time: 13.6 seconds (ref 11.4–15.2)

## 2018-09-27 LAB — GLUCOSE, CAPILLARY
Glucose-Capillary: 132 mg/dL — ABNORMAL HIGH (ref 70–99)
Glucose-Capillary: 135 mg/dL — ABNORMAL HIGH (ref 70–99)
Glucose-Capillary: 146 mg/dL — ABNORMAL HIGH (ref 70–99)
Glucose-Capillary: 183 mg/dL — ABNORMAL HIGH (ref 70–99)

## 2018-09-27 MED ORDER — SODIUM CHLORIDE 0.9 % IV SOLN
INTRAVENOUS | Status: DC | PRN
  Administered 2018-09-27: 250 mL via INTRAVENOUS

## 2018-09-27 MED ORDER — HYDRALAZINE HCL 20 MG/ML IJ SOLN: 10.0000 mg | Freq: Four times a day (QID) | INTRAMUSCULAR | Status: DC | PRN

## 2018-09-27 MED ORDER — GLIMEPIRIDE 2 MG PO TABS
2.0000 mg | ORAL_TABLET | Freq: Every day | ORAL | Status: DC
  Administered 2018-09-28 – 2018-09-29 (×2): 2 mg via ORAL
  Filled 2018-09-27 (×2): qty 1

## 2018-09-27 NOTE — Progress Notes (Signed)
Patient Demographics:    [name] [name], is a 63 y.o. male, DOB - [birth year], [name] RockerVWU:[id number]  Admit date - 09/24/2018   Admitting Physician Bobette Moavid Manuel Ortiz, MD  Outpatient Primary MD for the patient is [name], Roy, MD  LOS - 2   Chief Complaint  Patient presents with  . Abdominal Pain        Subjective:    [name] Rockerorris [name] today has no fevers, no emesis,  No chest pain, wife at bedside, complains of right shoulder pain suspect referred pain due to phrenic nerve irritation from perforated gallbladder with hemoperitoneum, had nausea  Assessment  & Plan :    Principal Problem:   Acute cholecystitis Active Problems:   GERD (gastroesophageal reflux disease)   Tobacco use   Hypophosphatemia   Hypertension  Brief Summary  [age] year old Caucasian male with biopsy-proven cryptogenic cirrhosis dating back to 2006 status post TIPS placement for refractory ascites a year later and history of asymptomatic cholelithiasis and on 09/25/2018 with perforated gallbladder currently on IV Zosyn   Plan:- 1)Calculus Cholecystitis with Perforation----apparently not a surgical candidate, also not a candidate for IR cholecystectomy tube placement at this time, continue IV Zosyn started 09/25/2018, surgical consult appreciated, GI consult appreciated, bilirubin trending up most likely due to reabsorption of bilirubin within abdominal cavity, patient may need repeat CT abdomen and pelvis within the next 1 to 2 days, may need HIDA scan down the road in order to help rule out ongoing bile leak, use prn opiates and PRN antiemetics  2)History of Cryptogenic Cirrhosis status post prior TIPS in 2007----no encephalopathy, monitor closely  3)DM2--- no recent A1c, use Amaryl with breakfast, continue with sliding scale coverage      4)HTN--- stable, continue Coreg 6.25 mg twice daily, losartan 100 mg daily,  may use IV Hydralazine 10 mg   Every 4 hours Prn for systolic blood pressure over 160 mmhg  5)FEN--- per general surgeon okay to try full liquid diet, continue IV fluids until oral intake is more reliable  Disposition/Need for in-Hospital Stay- patient unable to be discharged at this time due to Perforated gallbladder requiring IV antibiotics, inability to tolerate solid food at this time, requiring IV fluids  Code Status :  Full   Family Communication:   Wife at bedside  Disposition Plan  :  TBD  Consults  :  Gen Surgery/Gi---  DVT Prophylaxis  :   SCDs   Lab Results  Component Value Date   PLT 108 (L) 09/27/2018    Inpatient Medications  Scheduled Meds: . carvedilol  6.25 mg Oral BID WC  . furosemide  40 mg Oral Daily  . [START ON 09/28/2018] glimepiride  2 mg Oral Q breakfast  . insulin aspart  0-15 Units Subcutaneous TID WC  . insulin aspart  0-5 Units Subcutaneous QHS  . losartan  100 mg Oral Daily  . pantoprazole  40 mg Oral Daily   Continuous Infusions: . sodium chloride 250 mL (09/27/18 1210)  . famotidine (PEPCID) IV 20 mg (09/27/18 0943)  . lactated ringers 100 mL/hr at 09/27/18 1204  . piperacillin-tazobactam (ZOSYN)  IV 3.375 g (09/27/18 0926)   PRN Meds:.sodium chloride, acetaminophen **OR** acetaminophen, HYDROmorphone (DILAUDID) injection, ondansetron **OR** ondansetron (ZOFRAN) IV  Anti-infectives (From admission, onward)  Start     Dose/Rate Route Frequency Ordered Stop   09/25/18 1000  piperacillin-tazobactam (ZOSYN) IVPB 3.375 g     3.375 g 12.5 mL/hr over 240 Minutes Intravenous Every 8 hours 09/25/18 0249     09/25/18 0600  piperacillin-tazobactam (ZOSYN) IVPB 3.375 g  Status:  Discontinued     3.375 g 100 mL/hr over 30 Minutes Intravenous Every 8 hours 09/25/18 0245 09/25/18 0250   09/25/18 0215  piperacillin-tazobactam (ZOSYN) IVPB 3.375 g     3.375 g 100 mL/hr over 30 Minutes Intravenous  Once 09/25/18 0206 09/25/18 0246        Objective:   Vitals:   09/26/18 2100  09/26/18 2158 09/27/18 0625 09/27/18 0917  BP:  106/68 (!) 142/60 (!) 128/58  Pulse:  78 66 74  Resp:  18 18   Temp:  98.4 F (36.9 C) 98.5 F (36.9 C)   TempSrc:  Oral Oral   SpO2: 96% 90% 91%   Weight:      Height:        Wt Readings from Last 3 Encounters:  09/24/18 108.9 kg  12/28/16 110.2 kg  11/11/14 112.6 kg    No intake or output data in the 24 hours ending 09/27/18 1629   Physical Exam Patient is examined daily including today on 09/27/18 , exams remain the same as of yesterday except that has changed   Gen:- Awake Alert,  HEENT:- [place].AT, +ve icterus Neck-Supple Neck,No JVD,.  Lungs-  CTAB , fair symmetrical air movement CV- S1, S2 normal, regular Abd-  +ve B.Sounds, Abd Soft, right upper quadrant tenderness, no significant rebound, no CVA area tenderness    Extremity/Skin:- No  edema, pedal pulses present  Psych-affect is appropriate, oriented x3 Neuro-no new focal deficits, no tremors   Data Review:   Micro Results No results found for this or any previous visit (from the past 240 hour(s)).  Radiology Reports US Abdomen Complete  Result Date: 09/25/2018 CLINICAL DATA:  Cirrhosis.  Upper abdominal pain EXAM: ABDOMEN ULTRASOUND COMPLETE COMPARISON:  None. FINDINGS: Gallbladder: There is a 10 mm in size calculus adherent in the neck of the gallbladder. Gallbladder contains significant sludge. The gallbladder wall is diffusely thickened. There is no pericholecystic fluid. No sonographic Murphy sign noted by sonographer. Common bile duct: Diameter: 4 mm. No demonstrable intrahepatic, common hepatic, or common bile duct dilatation. Liver: No focal lesion identified. Liver has a nodular contour with diffuse increased echogenicity. A TIPS catheter is present and noted to be patent. Portal vein is patent on color Doppler imaging with normal direction of blood flow towards the liver. IVC: No abnormality visualized in visualized regions. Much of the infrahepatic inferior vena  cava is obscured by gas. Pancreas: Visualized portion unremarkable. Most of the pancreas is obscured by gas. Spleen: Size and appearance within normal limits. Spleen measures 12.1 cm. Right Kidney: Length: 11.7 cm. Echogenicity within normal limits. No mass or hydronephrosis visualized. Left Kidney: Length: 12.7 cm. Echogenicity within normal limits. No mass or hydronephrosis visualized. Abdominal aorta: No aneurysm visualized in visualized regions. Much of the aorta is obscured by gas. Other findings: There is a mild degree of ascites present. IMPRESSION: 1. Gallstone adherent in the neck of the gallbladder. Sludge throughout gallbladder. Gallbladder wall is thickened. While gallbladder wall thickening may be seen in association with ascites, this finding in association with the gallstone adherent in the neck of the gallbladder raises concern for a degree of acute cholecystitis. It may be prudent to consider nuclear  medicine hepatobiliary imaging study to assess for cystic duct patency. 2. Appearance of the liver is indicative of hepatic cirrhosis. While no focal liver lesions are evident, it must be cautioned that the sensitivity of ultrasound for detection of focal liver lesions is diminished significantly in this circumstance. TIPS catheter is present, patent. The flow in the portal vein is in the anatomic direction. 3. Most of the pancreas is obscured by gas. Much of the inferior vena cava and aorta are obscured by gas. 4.  Spleen appears toward the upper limits of normal in size. Electronically Signed   By: Bretta Bang III M.D.   On: 09/25/2018 09:22   Ct Abdomen Pelvis W Contrast  Result Date: 09/25/2018 CLINICAL DATA:  Upper abdominal pain with nausea and vomiting EXAM: CT ABDOMEN AND PELVIS WITH CONTRAST TECHNIQUE: Multidetector CT imaging of the abdomen and pelvis was performed using the standard protocol following bolus administration of intravenous contrast. CONTRAST:  OMNIPAQUE IOHEXOL  300 MG/ML  SOLN COMPARISON:  CT 01/01/2006 FINDINGS: Lower chest: Lung bases demonstrate no acute consolidation or effusion. The heart size is within normal limits. Small hiatal hernia Hepatobiliary: Cirrhotic morphology of the liver. Tips shunt is in place. No focal hepatic abnormality or biliary dilatation. Abnormal appearance of gallbladder with high density intraluminal material and hazy edema around the gallbladder lumen. Stone at the gallbladder neck. High density fluid along the inferior hepatic margin suspicious for hemoperitoneum. Rounded calcification in the fluid, series 2, image number 27, possibly a gallstone. Pancreas: Unremarkable. No pancreatic ductal dilatation or surrounding inflammatory changes. Spleen: Normal in size without focal abnormality. Adrenals/Urinary Tract: Adrenal glands are unremarkable. Kidneys are normal, without renal calculi, focal lesion, or hydronephrosis. Bladder is unremarkable. Stomach/Bowel: Stomach is within normal limits. Appendix appears normal. No evidence of bowel wall thickening, distention, or inflammatory changes. Vascular/Lymphatic: Nonaneurysmal aorta. Mild aortic atherosclerosis. No significantly enlarged lymph nodes Reproductive: Slightly enlarged prostate with calcification Other: No free air. Small slightly hyperdense fluid within the pelvis with more moderate fluid in the right upper quadrant adjacent to the liver. Fat containing inguinal hernias. Musculoskeletal: Advanced degenerative changes of the spine. No acute osseous abnormality. IMPRESSION: 1. Abnormal appearance of the gallbladder which contains intraluminal high density material. There is hazy edema and or fluid around the gallbladder with a stone present in the gallbladder neck. Findings could be secondary to an acute cholecystitis. Additional finding of hemorrhagic fluid along the inferior margin of the liver with rounded calcifications in the hemorrhagic fluid, question gallbladder perforation  with extravasated stones. Associated hemoperitoneum. 2. Cirrhosis of the liver. 3. Small amount of hemorrhagic ascites within the pelvis. Electronically Signed   By: Jasmine Pang M.D.   On: 09/25/2018 02:02     CBC Recent Labs  Lab 09/24/18 2350 09/25/18 0714 09/26/18 0538 09/27/18 0630  WBC 11.6* 11.6* 8.8 11.3*  HGB 13.2 12.1* 11.8* 11.2*  HCT 40.2 36.8* 36.6* 34.4*  PLT 106* 97* 97* 108*  MCV 92.8 94.4 95.8 95.8  MCH 30.5 31.0 30.9 31.2  MCHC 32.8 32.9 32.2 32.6  RDW 13.5 13.9 14.2 14.6  LYMPHSABS 0.7 1.0 1.0 0.9  MONOABS 1.0 1.0 1.1* 1.2*  EOSABS 0.1 0.1 0.0 0.1  BASOSABS 0.0 0.0 0.0 0.0    Chemistries  Recent Labs  Lab 09/24/18 2350 09/25/18 0714 09/26/18 0538 09/27/18 0630  NA 137 138 138 141  K 4.9 4.8 4.2 5.0  CL 108 109 106 107  CO2 21* 21* 24 22  GLUCOSE 240* 248* 191*  133*  BUN 18 21 27* 54*  CREATININE 1.26* 1.44* 1.84* 2.56*  CALCIUM 8.6* 8.4* 8.5* 8.9  MG 2.0  --   --   --   AST 35 35 43* 56*  ALT 40 37 41 38  ALKPHOS 132* 109 107 80  BILITOT 3.1* 2.6* 5.1* 14.6*   Coagulation profile Recent Labs  Lab 09/25/18 0954 09/27/18 0630  INR 1.06 1.05    No results for input(s): DDIMER in the last 72 hours.  Cardiac Enzymes No results for input(s): CKMB, TROPONINI, MYOGLOBIN in the last 168 hours.  Invalid input(s): CK ------------------------------------------------------------------------------------------------------------------ No results found for: BNP   Shon Hale M.D on 09/27/2018 at 4:29 PM  Go to [email or web address] - for contact info  [facility] - Office  [phone]

## 2018-09-27 NOTE — Progress Notes (Signed)
  Subjective: Patient complains of right shoulder pain intermittently.  He is also frustrated that he was made n.p.o. again.  He is hungry.  He has minimal right upper quadrant abdominal pain.  Objective: Vital signs in last 24 hours: Temp:  [97.5 F (36.4 C)-98.5 F (36.9 C)] 98.5 F (36.9 C) (02/22 0625) Pulse Rate:  [59-78] 74 (02/22 0917) Resp:  [18-20] 18 (02/22 0625) BP: (106-142)/(58-68) 128/58 (02/22 0917) SpO2:  [90 %-100 %] 91 % (02/22 0625) Last BM Date: 09/24/18  Intake/Output from previous day: 02/21 0701 - 02/22 0700 In: 0  Out: 300 [Urine:300] Intake/Output this shift: No intake/output data recorded.  General appearance: alert, cooperative and no distress GI: Soft with minimal tenderness in the right upper quadrant to palpation.  He does have a rotund abdomen which limits assessment.  No peritoneal signs appreciated.  Lab Results:  Recent Labs    09/26/18 0538 09/27/18 0630  WBC 8.8 11.3*  HGB 11.8* 11.2*  HCT 36.6* 34.4*  PLT 97* 108*   BMET Recent Labs    09/26/18 0538 09/27/18 0630  NA 138 141  K 4.2 5.0  CL 106 107  CO2 24 22  GLUCOSE 191* 133*  BUN 27* 54*  CREATININE 1.84* 2.56*  CALCIUM 8.5* 8.9   PT/INR Recent Labs    09/25/18 0954 09/27/18 0630  LABPROT 13.7 13.6  INR 1.06 1.05    Studies/Results: No results found.  Anti-infectives: Anti-infectives (From admission, onward)   Start     Dose/Rate Route Frequency Ordered Stop   09/25/18 1000  piperacillin-tazobactam (ZOSYN) IVPB 3.375 g     3.375 g 12.5 mL/hr over 240 Minutes Intravenous Every 8 hours 09/25/18 0249     09/25/18 0600  piperacillin-tazobactam (ZOSYN) IVPB 3.375 g  Status:  Discontinued     3.375 g 100 mL/hr over 30 Minutes Intravenous Every 8 hours 09/25/18 0245 09/25/18 0250   09/25/18 0215  piperacillin-tazobactam (ZOSYN) IVPB 3.375 g     3.375 g 100 mL/hr over 30 Minutes Intravenous  Once 09/25/18 0206 09/25/18 0246      Assessment/Plan: Impression:  Perforated cholecystitis with cholelithiasis.  Not surprised that the patient's bilirubin has climbed.  It is assumed that this is secondary to reabsorption of the bilirubin within the abdominal cavity.  May get CT scan of abdomen tomorrow to further assess.  May need HIDA scan in the future to see whether he has an ongoing bile leak.  All of this was explained to the patient.  Patient may be back on full liquid diet.  His right shoulder pain is most likely referred pain.  LOS: 2 days    Franky Macho 09/27/2018

## 2018-09-28 LAB — COMPREHENSIVE METABOLIC PANEL
ALT: 36 U/L (ref 0–44)
AST: 56 U/L — ABNORMAL HIGH (ref 15–41)
Albumin: 2.6 g/dL — ABNORMAL LOW (ref 3.5–5.0)
Alkaline Phosphatase: 66 U/L (ref 38–126)
Anion gap: 10 (ref 5–15)
BUN: 60 mg/dL — ABNORMAL HIGH (ref 8–23)
CO2: 21 mmol/L — ABNORMAL LOW (ref 22–32)
Calcium: 8.3 mg/dL — ABNORMAL LOW (ref 8.9–10.3)
Chloride: 106 mmol/L (ref 98–111)
Creatinine, Ser: 2.15 mg/dL — ABNORMAL HIGH (ref 0.61–1.24)
GFR calc Af Amer: 37 mL/min — ABNORMAL LOW (ref >60)
GFR calc non Af Amer: 32 mL/min — ABNORMAL LOW (ref >60)
Glucose, Bld: 193 mg/dL — ABNORMAL HIGH (ref 70–99)
Potassium: 3.8 mmol/L (ref 3.5–5.1)
Sodium: 137 mmol/L (ref 135–145)
Total Bilirubin: 12 mg/dL — ABNORMAL HIGH (ref 0.3–1.2)
Total Protein: 6.1 g/dL — ABNORMAL LOW (ref 6.5–8.1)

## 2018-09-28 LAB — CBC WITH DIFFERENTIAL/PLATELET
Abs Immature Granulocytes: 0.06 10*3/uL (ref 0.00–0.07)
Basophils Absolute: 0 10*3/uL (ref 0.0–0.1)
Basophils Relative: 1 %
Eosinophils Absolute: 0.1 10*3/uL (ref 0.0–0.5)
Eosinophils Relative: 1 %
HCT: 31.1 % — ABNORMAL LOW (ref 39.0–52.0)
Hemoglobin: 10 g/dL — ABNORMAL LOW (ref 13.0–17.0)
Immature Granulocytes: 1 %
Lymphocytes Relative: 10 %
Lymphs Abs: 0.7 10*3/uL (ref 0.7–4.0)
MCH: 30.2 pg (ref 26.0–34.0)
MCHC: 32.2 g/dL (ref 30.0–36.0)
MCV: 94 fL (ref 80.0–100.0)
MONO ABS: 0.7 10*3/uL (ref 0.1–1.0)
MONOS PCT: 11 %
Neutro Abs: 4.9 10*3/uL (ref 1.7–7.7)
Neutrophils Relative %: 76 %
Platelets: 109 10*3/uL — ABNORMAL LOW (ref 150–400)
RBC: 3.31 MIL/uL — AB (ref 4.22–5.81)
RDW: 14.7 % (ref 11.5–15.5)
WBC: 6.4 10*3/uL (ref 4.0–10.5)
nRBC: 0 % (ref 0.0–0.2)

## 2018-09-28 LAB — PROTIME-INR
INR: 1.09
Prothrombin Time: 14 seconds (ref 11.4–15.2)

## 2018-09-28 LAB — GLUCOSE, CAPILLARY
Glucose-Capillary: 171 mg/dL — ABNORMAL HIGH (ref 70–99)
Glucose-Capillary: 158 mg/dL — ABNORMAL HIGH (ref 70–99)
Glucose-Capillary: 109 mg/dL — ABNORMAL HIGH (ref 70–99)
Glucose-Capillary: 82 mg/dL (ref 70–99)

## 2018-09-28 LAB — AMMONIA: AMMONIA: 37 umol/L — AB (ref 9–35)

## 2018-09-28 LAB — BILIRUBIN, DIRECT: Bilirubin, Direct: 7.9 mg/dL — ABNORMAL HIGH (ref 0.0–0.2)

## 2018-09-28 MED ORDER — LORAZEPAM 2 MG/ML IJ SOLN
1.0000 mg | Freq: Once | INTRAMUSCULAR | Status: AC
  Administered 2018-09-28: 1 mg via INTRAVENOUS
  Filled 2018-09-28: qty 1

## 2018-09-28 MED ORDER — QUETIAPINE FUMARATE 25 MG PO TABS
25.0000 mg | ORAL_TABLET | Freq: Once | ORAL | Status: AC
  Administered 2018-09-28: 25 mg via ORAL
  Filled 2018-09-28: qty 1

## 2018-09-28 MED ORDER — QUETIAPINE FUMARATE 25 MG PO TABS
25.0000 mg | ORAL_TABLET | Freq: Every day | ORAL | Status: DC
  Administered 2018-09-28 – 2018-10-01 (×4): 25 mg via ORAL
  Filled 2018-09-28 (×4): qty 1

## 2018-09-28 MED ORDER — SODIUM CHLORIDE 0.45 % IV BOLUS
2000.0000 mL | Freq: Once | INTRAVENOUS | Status: AC
  Administered 2018-09-28: 2000 mL via INTRAVENOUS

## 2018-09-28 MED ORDER — METRONIDAZOLE IN NACL 5-0.79 MG/ML-% IV SOLN: 500.0000 mg | Freq: Three times a day (TID) | INTRAVENOUS | Status: DC

## 2018-09-28 MED ORDER — PANTOPRAZOLE SODIUM 40 MG PO TBEC
40.0000 mg | DELAYED_RELEASE_TABLET | Freq: Two times a day (BID) | ORAL | Status: DC
  Administered 2018-09-28 – 2018-10-02 (×8): 40 mg via ORAL
  Filled 2018-09-28 (×8): qty 1

## 2018-09-28 MED ORDER — PIPERACILLIN-TAZOBACTAM 3.375 G IVPB
3.3750 g | Freq: Three times a day (TID) | INTRAVENOUS | Status: DC
  Administered 2018-09-28 – 2018-10-02 (×13): 3.375 g via INTRAVENOUS
  Filled 2018-09-28 (×13): qty 50

## 2018-09-28 MED ORDER — HALOPERIDOL LACTATE 5 MG/ML IJ SOLN
5.0000 mg | Freq: Once | INTRAMUSCULAR | Status: AC
  Administered 2018-09-28: 5 mg via INTRAVENOUS
  Filled 2018-09-28: qty 1

## 2018-09-28 MED ORDER — SODIUM CHLORIDE 0.45 % IV BOLUS
1000.0000 mL | Freq: Once | INTRAVENOUS | Status: AC
  Administered 2018-09-28: 1000 mL via INTRAVENOUS

## 2018-09-28 MED ORDER — FUROSEMIDE 40 MG PO TABS: 40.0000 mg | ORAL_TABLET | Freq: Every day | ORAL | Status: DC

## 2018-09-28 MED ORDER — LOSARTAN POTASSIUM 50 MG PO TABS: 50.0000 mg | ORAL_TABLET | Freq: Every day | ORAL | Status: DC

## 2018-09-28 NOTE — Progress Notes (Signed)
Patient Demographics:    [name] [name], is a 63 y.o. male, DOB - 12-12-1955, JYN:[id number]  Admit date - 09/24/2018   Admitting Physician [name], MD  Outpatient Primary MD for the patient is [name], MD  LOS - 3   Chief Complaint  Patient presents with  . Abdominal Pain        Subjective:    [name] [name] today has no fevers, no emesis,  No chest pain, mother and sister at bedside, intermittent episodes of confusion persist, overnight patient apparently became agitated and belligerent with paranoia, and hallucinations  Assessment  & Plan :    Principal Problem:   Acute cholecystitis Active Problems:   GERD (gastroesophageal reflux disease)   Tobacco use   Hypophosphatemia   Hypertension  Brief Summary  63 year old Caucasian male with biopsy-proven cryptogenic cirrhosis dating back to 2006 status post TIPS placement for refractory ascites a year later and history of asymptomatic cholelithiasis and on 09/25/2018 with perforated gallbladder currently on IV Zosyn, now having hospital psychosis/paranoia/hallucinations most likely due to acute illness, being in the hospital and opiate use   Plan:- 1)Calculus Cholecystitis with Perforation----apparently not a surgical candidate, also not a candidate for IR cholecystectomy tube placement at this time, continue IV Zosyn started 09/25/2018, surgical consult appreciated, GI consult appreciated, bilirubin is down to 12.0 from 14.6, suspect it is most likely due to reabsorption of bilirubin within abdominal cavity, patient may need repeat CT abdomen and pelvis on 09/29/2018,  may need HIDA scan down the road in order to help rule out ongoing bile leak, use prn opiates and PRN antiemetics  2)History of Cryptogenic Cirrhosis status post prior TIPS in 2007----no encephalopathy, monitor closely , serum ammonia is 37 on 09/28/18  3)DM2--- no recent A1c,  stable, use Amaryl with breakfast, continue with sliding scale coverage      4)HTN--- stable, continue Coreg 6.25 mg twice daily, losartan 100 mg daily,  may use IV Hydralazine 10 mg  Every 4 hours Prn for systolic blood pressure over 160 mmhg  5)FEN--- per general surgeon okay to try full liquid diet, continue IV fluids until oral intake is more reliable  6)Acute Toxic Metabolic Encephalopathy--- now having hospital psychosis with paranoia/hallucinations most likely due to acute illness, being in the hospital and opiate use, this is not hepatic encephalopathy as patient is agitated rather lethargic and serum ammonia is 37 on 09/28/18, give Seroquel, taper off opiates  7)AKI----acute kidney injury due to dehydration in the setting of nausea vomiting secondary to cholecystitis compounded by Lasix and losartan use, creatinine on admission= 1.26 ,   baseline creatinine = 1.1   , creatinine is now= 2.15 (peak 2.56)   , renally adjust medications, avoid nephrotoxic agents/dehydration/hypotension  Disposition/Need for in-Hospital Stay- patient unable to be discharged at this time due to Perforated gallbladder requiring IV antibiotics, inability to tolerate solid food at this time, requiring IV fluids  Code Status :  Full   Family Communication: Sister and mother at bedside  Disposition Plan  :  TBD  Consults  :  Gen Surgery/Gi---  DVT Prophylaxis  :   SCDs   Lab Results  Component Value Date   PLT 109 (L) 09/28/2018    Inpatient Medications  Scheduled  Meds: . carvedilol  6.25 mg Oral BID WC  . [START ON 10/01/2018] furosemide  40 mg Oral Daily  . glimepiride  2 mg Oral Q breakfast  . insulin aspart  0-15 Units Subcutaneous TID WC  . insulin aspart  0-5 Units Subcutaneous QHS  . [START ON 09/29/2018] losartan  50 mg Oral Daily  . pantoprazole  40 mg Oral Daily  . pantoprazole  40 mg Oral BID AC  . QUEtiapine  25 mg Oral Once  . QUEtiapine  25 mg Oral QHS   Continuous Infusions: .  sodium chloride 250 mL (09/27/18 1210)  . lactated ringers 100 mL/hr at 09/28/18 1120  . piperacillin-tazobactam (ZOSYN)  IV 3.375 g (09/28/18 1118)   PRN Meds:.sodium chloride, acetaminophen **OR** acetaminophen, hydrALAZINE, HYDROmorphone (DILAUDID) injection, ondansetron **OR** ondansetron (ZOFRAN) IV  Anti-infectives (From admission, onward)   Start     Dose/Rate Route Frequency Ordered Stop   09/28/18 1000  piperacillin-tazobactam (ZOSYN) IVPB 3.375 g     3.375 g 12.5 mL/hr over 240 Minutes Intravenous Every 8 hours 09/28/18 0836     09/28/18 0500  metroNIDAZOLE (FLAGYL) IVPB 500 mg  Status:  Discontinued     500 mg 100 mL/hr over 60 Minutes Intravenous Every 8 hours 09/28/18 0447 09/28/18 0454   09/25/18 1000  piperacillin-tazobactam (ZOSYN) IVPB 3.375 g  Status:  Discontinued     3.375 g 12.5 mL/hr over 240 Minutes Intravenous Every 8 hours 09/25/18 0249 09/28/18 0447   09/25/18 0600  piperacillin-tazobactam (ZOSYN) IVPB 3.375 g  Status:  Discontinued     3.375 g 100 mL/hr over 30 Minutes Intravenous Every 8 hours 09/25/18 0245 09/25/18 0250   09/25/18 0215  piperacillin-tazobactam (ZOSYN) IVPB 3.375 g     3.375 g 100 mL/hr over 30 Minutes Intravenous  Once 09/25/18 0206 09/25/18 0246        Objective:   Vitals:   09/27/18 1801 09/27/18 2108 09/27/18 2134 09/28/18 0421  BP: 132/65  (!) 144/51 (!) 126/49  Pulse: 75  79 80  Resp: 18  18 18   Temp: 98.3 F (36.8 C)  98.4 F (36.9 C) 98 F (36.7 C)  TempSrc: Oral  Oral Oral  SpO2: 97% 93% 96% 95%  Weight:      Height:        Wt Readings from Last 3 Encounters:  09/24/18 108.9 kg  12/28/16 110.2 kg  11/11/14 112.6 kg     Intake/Output Summary (Last 24 hours) at 09/28/2018 1331 Last data filed at 09/28/2018 1300 Gross per 24 hour  Intake 7156.02 ml  Output 250 ml  Net 6906.02 ml     Physical Exam Patient is examined daily including today on 09/28/18 , exams remain the same as of yesterday except that has  changed   Gen:- Awake Alert, episodes of agitation, disorientation HEENT:- [place].AT, +ve icterus Neck-Supple Neck,No JVD,.  Lungs-  CTAB , fair symmetrical air movement CV- S1, S2 normal, regular Abd-  +ve B.Sounds, Abd Soft, right upper quadrant tenderness, no significant rebound, no CVA area tenderness    Extremity/Skin:- No  edema, pedal pulses present  Psych-affect is agitated at times, disoriented with psychosis at times (hallucinations/paranoia) Neuro-generalized weakness, but no new focal deficits, no tremors   Data Review:   Micro Results No results found for this or any previous visit (from the past 240 hour(s)).  Radiology Reports US Abdomen Complete  Result Date: 09/25/2018 CLINICAL DATA:  Cirrhosis.  Upper abdominal pain EXAM: ABDOMEN ULTRASOUND COMPLETE COMPARISON:  None.  FINDINGS: Gallbladder: There is a 10 mm in size calculus adherent in the neck of the gallbladder. Gallbladder contains significant sludge. The gallbladder wall is diffusely thickened. There is no pericholecystic fluid. No sonographic Murphy sign noted by sonographer. Common bile duct: Diameter: 4 mm. No demonstrable intrahepatic, common hepatic, or common bile duct dilatation. Liver: No focal lesion identified. Liver has a nodular contour with diffuse increased echogenicity. A TIPS catheter is present and noted to be patent. Portal vein is patent on color Doppler imaging with normal direction of blood flow towards the liver. IVC: No abnormality visualized in visualized regions. Much of the infrahepatic inferior vena cava is obscured by gas. Pancreas: Visualized portion unremarkable. Most of the pancreas is obscured by gas. Spleen: Size and appearance within normal limits. Spleen measures 12.1 cm. Right Kidney: Length: 11.7 cm. Echogenicity within normal limits. No mass or hydronephrosis visualized. Left Kidney: Length: 12.7 cm. Echogenicity within normal limits. No mass or hydronephrosis visualized. Abdominal aorta: No  aneurysm visualized in visualized regions. Much of the aorta is obscured by gas. Other findings: There is a mild degree of ascites present. IMPRESSION: 1. Gallstone adherent in the neck of the gallbladder. Sludge throughout gallbladder. Gallbladder wall is thickened. While gallbladder wall thickening may be seen in association with ascites, this finding in association with the gallstone adherent in the neck of the gallbladder raises concern for a degree of acute cholecystitis. It may be prudent to consider nuclear medicine hepatobiliary imaging study to assess for cystic duct patency. 2. Appearance of the liver is indicative of hepatic cirrhosis. While no focal liver lesions are evident, it must be cautioned that the sensitivity of ultrasound for detection of focal liver lesions is diminished significantly in this circumstance. TIPS catheter is present, patent. The flow in the portal vein is in the anatomic direction. 3. Most of the pancreas is obscured by gas. Much of the inferior vena cava and aorta are obscured by gas. 4.  Spleen appears toward the upper limits of normal in size. Electronically Signed   By: Bretta Bang III M.D.   On: 09/25/2018 09:22   Ct Abdomen Pelvis W Contrast  Result Date: 09/25/2018 CLINICAL DATA:  Upper abdominal pain with nausea and vomiting EXAM: CT ABDOMEN AND PELVIS WITH CONTRAST TECHNIQUE: Multidetector CT imaging of the abdomen and pelvis was performed using the standard protocol following bolus administration of intravenous contrast. CONTRAST:  OMNIPAQUE IOHEXOL 300 MG/ML  SOLN COMPARISON:  CT 01/01/2006 FINDINGS: Lower chest: Lung bases demonstrate no acute consolidation or effusion. The heart size is within normal limits. Small hiatal hernia Hepatobiliary: Cirrhotic morphology of the liver. Tips shunt is in place. No focal hepatic abnormality or biliary dilatation. Abnormal appearance of gallbladder with high density intraluminal material and hazy edema around the  gallbladder lumen. Stone at the gallbladder neck. High density fluid along the inferior hepatic margin suspicious for hemoperitoneum. Rounded calcification in the fluid, series 2, image number 27, possibly a gallstone. Pancreas: Unremarkable. No pancreatic ductal dilatation or surrounding inflammatory changes. Spleen: Normal in size without focal abnormality. Adrenals/Urinary Tract: Adrenal glands are unremarkable. Kidneys are normal, without renal calculi, focal lesion, or hydronephrosis. Bladder is unremarkable. Stomach/Bowel: Stomach is within normal limits. Appendix appears normal. No evidence of bowel wall thickening, distention, or inflammatory changes. Vascular/Lymphatic: Nonaneurysmal aorta. Mild aortic atherosclerosis. No significantly enlarged lymph nodes Reproductive: Slightly enlarged prostate with calcification Other: No free air. Small slightly hyperdense fluid within the pelvis with more moderate fluid in the right upper quadrant  adjacent to the liver. Fat containing inguinal hernias. Musculoskeletal: Advanced degenerative changes of the spine. No acute osseous abnormality. IMPRESSION: 1. Abnormal appearance of the gallbladder which contains intraluminal high density material. There is hazy edema and or fluid around the gallbladder with a stone present in the gallbladder neck. Findings could be secondary to an acute cholecystitis. Additional finding of hemorrhagic fluid along the inferior margin of the liver with rounded calcifications in the hemorrhagic fluid, question gallbladder perforation with extravasated stones. Associated hemoperitoneum. 2. Cirrhosis of the liver. 3. Small amount of hemorrhagic ascites within the pelvis. Electronically Signed   By: Jasmine Pang M.D.   On: 09/25/2018 02:02     CBC Recent Labs  Lab 09/24/18 2350 09/25/18 0714 09/26/18 0538 09/27/18 0630 09/28/18 0625  WBC 11.6* 11.6* 8.8 11.3* 6.4  HGB 13.2 12.1* 11.8* 11.2* 10.0*  HCT 40.2 36.8* 36.6* 34.4* 31.1*    PLT 106* 97* 97* 108* 109*  MCV 92.8 94.4 95.8 95.8 94.0  MCH 30.5 31.0 30.9 31.2 30.2  MCHC 32.8 32.9 32.2 32.6 32.2  RDW 13.5 13.9 14.2 14.6 14.7  LYMPHSABS 0.7 1.0 1.0 0.9 0.7  MONOABS 1.0 1.0 1.1* 1.2* 0.7  EOSABS 0.1 0.1 0.0 0.1 0.1  BASOSABS 0.0 0.0 0.0 0.0 0.0    Chemistries  Recent Labs  Lab 09/24/18 2350 09/25/18 0714 09/26/18 0538 09/27/18 0630 09/28/18 0625  NA 137 138 138 141 137  K 4.9 4.8 4.2 5.0 3.8  CL 108 109 106 107 106  CO2 21* 21* 24 22 21*  GLUCOSE 240* 248* 191* 133* 193*  BUN 18 21 27* 54* 60*  CREATININE 1.26* 1.44* 1.84* 2.56* 2.15*  CALCIUM 8.6* 8.4* 8.5* 8.9 8.3*  MG 2.0  --   --   --   --   AST 35 35 43* 56* 56*  ALT 40 37 41 38 36  ALKPHOS 132* 109 107 80 66  BILITOT 3.1* 2.6* 5.1* 14.6* 12.0*   Coagulation profile Recent Labs  Lab 09/25/18 0954 09/27/18 0630 09/28/18 0625  INR 1.06 1.05 1.09    No results for input(s): DDIMER in the last 72 hours.  Cardiac Enzymes No results for input(s): CKMB, TROPONINI, MYOGLOBIN in the last 168 hours.  Invalid input(s): CK ------------------------------------------------------------------------------------------------------------------ No results found for: BNP   Shon Hale M.D on 09/28/2018 at 1:31 PM  Go to [email or web address] - for contact info  [facility] - Office  [phone]

## 2018-09-28 NOTE — Progress Notes (Signed)
Patient ID: [name] [name], male   DOB: 05-31-1956, 63 y.o.   MRN: [id number]  Night shift floor coverage note.  The patient has continued to have some odd thought and paranoia. He was placed on Seroquel at bed time and given haldol earlier. His BUN and creatinine continue to be elevated, but have improved. I will discontinue diuretic and ARB. I will give another 1000 mL NaCl 0.45% bolus in anticipation of possible CT abdomen/pelvis tomorrow. Follow up renal function and electrolytes.  Sanda Klein., MD

## 2018-09-28 NOTE — Progress Notes (Signed)
Night shift floor coverage note  The patient was seen due to becoming restless and paranoid with the staff. He stated that we were not providing the right treatment and earlier the staff reports that he seemed to have some mild confusion.He also wanted to see Dr. Karilyn Cota ASAP. He stated that Dr. Karilyn Cota is his neighbor and that he only trusts him and not [place].  His vital signs have been stable. Most recent set showed a temperature 98 F, pulse 80, respiration 18, blood pressure 126/49 mmHg and O2 sat 95% on room air. He states though that his pain is better.  However, he stated that he was thirsty. Physical exam:  HEENT: normocephalic, icteric sclerae. Neck: Supple, no JVD. Lungs: CTA bilaterally. Cardiovascular: S1, S2, no murmurs. Abdomen: Obese, nondistended, soft, greatly decreased RUQ tenderness when compared to my assessment 3 days ago. Extremities: No edema, clubbing or cyanosis Neuro: Awake, alert, oriented x2, partially oriented to time/date, seems to be having some confusion about his current medical condition.  Hepatic function panel [[id number]] (Abnormal)   Collected: 09/27/18 0630   Updated: 09/27/18 0810   Specimen Type: Blood    Total Protein 6.9 g/dL   Albumin 9.1MBW  g/dL   AST 46KZLD  U/L   ALT 38 U/L   Alkaline Phosphatase 80 U/L   Total Bilirubin 14.6High  mg/dL   Bilirubin, Direct 3.5TSVX  mg/dL   Indirect Bilirubin 7.9TJQZ  mg/dL  Basic metabolic panel [[id number]] (Abnormal)   Collected: 09/27/18 0630   Updated: 09/27/18 0810   Specimen Type: Blood    Sodium 141 mmol/L   Potassium 5.0 mmol/L   Chloride 107 mmol/L   CO2 22 mmol/L   Glucose, Bld 133High  mg/dL   BUN 62UQJF  mg/dL   Creatinine, Ser 2.56High  mg/dL   Calcium 8.9 mg/dL   GFR calc non Af Amer 26Low  mL/min   GFR calc Af Amer 30Low  mL/min   Anion gap 12  CBC WITH DIFFERENTIAL [[id number]] (Abnormal)   Collected: 09/27/18 0630   Updated: 09/27/18 0804   Specimen Type: Blood    WBC 11.3High  K/uL    RBC 3.59Low  MIL/uL   Hemoglobin 11.2Low  g/dL   HCT 89.3TDS  %   MCV 95.8 fL   MCH 31.2 pg   MCHC 32.6 g/dL   RDW 28.7 %   Platelets 108Low  K/uL   nRBC 0.0 %   Neutrophils Relative % 79 %   Neutro Abs 8.9High  K/uL   Lymphocytes Relative 8 %   Lymphs Abs 0.9 K/uL   Monocytes Relative 11 %   Monocytes Absolute 1.2High  K/uL   Eosinophils Relative 1 %   Eosinophils Absolute 0.1 K/uL   Basophils Relative 0 %   Basophils Absolute 0.0 K/uL   Immature Granulocytes 1 %   Abs Immature Granulocytes 0.12High  K/uL  Protime-INR [[id number]]   Collected: 09/27/18 0630   Updated: 09/27/18 0355   Specimen Type: Blood    Prothrombin Time 13.6 seconds   INR 1.05   Assessment:  AKI Probably a combination of dehydration secondary to loose stools, decreased oral intake. Zosyn pseudo-nephrotoxicity or nephrotoxicity should be considred. Will ask pharmacy to adjust dosage. A 0.45% NaCl 2000 mL bolus was ordered.  Will check CBC, CMP, PT/PTT/INR and ammonia level.  Lorazepam 1 mg IVP for anxiety and restlessness was also ordered. The Patient has been asked to assist the staff with output measurement by urinating the urine output has been provided.  Sanda Klein, MD  About 30 minutes of critical care time were spent during the process of this event.  This document was prepared using Dragon voice recognition software may contain some unintended transcription errors.

## 2018-09-28 NOTE — Progress Notes (Signed)
Pharmacy Antibiotic Note  [name] [name] is a 63 y.o. male admitted on 09/24/2018 with IAI.  Pharmacy has been consulted for ZOSYN dosing.  Plan: Zosyn 3.375g IV q8h (4 hour infusion). 2/23 >> Continue to monitor renal fxn. (suspected prerenal)  Height: 5\' 11"  (180.3 cm) Weight: 240 lb (108.9 kg) IBW/kg (Calculated) : 75.3  Temp (24hrs), Avg:98.2 F (36.8 C), Min:98 F (36.7 C), Max:98.4 F (36.9 C)  Recent Labs  Lab 09/24/18 2350 09/25/18 0714 09/26/18 0538 09/27/18 0630 09/28/18 0625  WBC 11.6* 11.6* 8.8 11.3* 6.4  CREATININE 1.26* 1.44* 1.84* 2.56* 2.15*    Estimated Creatinine Clearance: 44.7 mL/min (A) (by C-G formula based on SCr of 2.15 mg/dL (H)).    No Known Allergies  Antimicrobials this admission: Zosyn 2/23 >>   Dose adjustments this admission:  Microbiology results:  BCx:   UCx:    Sputum:    MRSA PCR:   Thank you for allowing pharmacy to be a part of this patient's care.  Valrie Hart A 09/28/2018 8:37 AM

## 2018-09-28 NOTE — Progress Notes (Signed)
Patient had woken up earlier confused, irritable.  RN reassessed patient and patient is alert and oriented x 4 at this time.  Wife is at bedside.  RN will continue to assess patient and make MD aware of any changes.  P.J. Henderson Newcomer, RN

## 2018-09-28 NOTE — Progress Notes (Signed)
  Subjective: Noted that patient is having deliriums.  He can be somewhat belligerent during those times.  He states to me that his right shoulder pain has resolved.  His only complaint is that about the room he is staying in.  Objective: Vital signs in last 24 hours: Temp:  [98 F (36.7 C)-98.4 F (36.9 C)] 98 F (36.7 C) (02/23 0421) Pulse Rate:  [75-89] 80 (02/23 0421) Resp:  [18] 18 (02/23 0421) BP: (126-144)/(49-73) 126/49 (02/23 0421) SpO2:  [93 %-97 %] 95 % (02/23 0421) Last BM Date: 09/24/18  Intake/Output from previous day: 02/22 0701 - 02/23 0700 In: 5915 [P.O.:240; I.V.:3881.5; IV Piggyback:1793.5] Out: 150 [Urine:150] Intake/Output this shift: Total I/O In: 851 [P.O.:240; IV Piggyback:611] Out: -   General appearance: alert and no distress GI: Soft, rotund.  No tenderness noted.  No rigidity noted.  Eyes with scleral icterus  Lab Results:  Recent Labs    09/27/18 0630 09/28/18 0625  WBC 11.3* 6.4  HGB 11.2* 10.0*  HCT 34.4* 31.1*  PLT 108* 109*   BMET Recent Labs    09/27/18 0630 09/28/18 0625  NA 141 137  K 5.0 3.8  CL 107 106  CO2 22 21*  GLUCOSE 133* 193*  BUN 54* 60*  CREATININE 2.56* 2.15*  CALCIUM 8.9 8.3*   PT/INR Recent Labs    09/27/18 0630 09/28/18 0625  LABPROT 13.6 14.0  INR 1.05 1.09    Studies/Results: No results found.  Anti-infectives: Anti-infectives (From admission, onward)   Start     Dose/Rate Route Frequency Ordered Stop   09/28/18 1000  piperacillin-tazobactam (ZOSYN) IVPB 3.375 g     3.375 g 12.5 mL/hr over 240 Minutes Intravenous Every 8 hours 09/28/18 0836     09/28/18 0500  metroNIDAZOLE (FLAGYL) IVPB 500 mg  Status:  Discontinued     500 mg 100 mL/hr over 60 Minutes Intravenous Every 8 hours 09/28/18 0447 09/28/18 0454   09/25/18 1000  piperacillin-tazobactam (ZOSYN) IVPB 3.375 g  Status:  Discontinued     3.375 g 12.5 mL/hr over 240 Minutes Intravenous Every 8 hours 09/25/18 0249 09/28/18 0447   09/25/18 0600  piperacillin-tazobactam (ZOSYN) IVPB 3.375 g  Status:  Discontinued     3.375 g 100 mL/hr over 30 Minutes Intravenous Every 8 hours 09/25/18 0245 09/25/18 0250   09/25/18 0215  piperacillin-tazobactam (ZOSYN) IVPB 3.375 g     3.375 g 100 mL/hr over 30 Minutes Intravenous  Once 09/25/18 0206 09/25/18 0246      Assessment/Plan: Impression: Perforated cholecystitis with cholelithiasis in the face of cirrhosis.  Clinically, the patient appears improved.  His total bilirubin is starting to drop.  He has no leukocytosis.  His hemoglobin is drifting down but stable, most likely secondary to equilibration of fluids. Plan: Continue current management.  Probable CT scan of the abdomen tomorrow for follow-up.  No need for acute surgical intervention at this time.  LOS: 3 days    [name] [name] 09/28/2018

## 2018-09-28 NOTE — Progress Notes (Signed)
Patient had become agitated and paranoid around 0410 this morning.  He came to nurses station with his sister and demanded to speak to Dr. Karilyn Cota.  He stated that he was going to leave and his hospitalization "is a hoax".  Patient pulled off telemetry box and attempted to remove IV.  RN was able to calm patient down enough to escort him back to his room.  RN explained to patient and sister that Dr. Karilyn Cota is not available and Dr. Darrick Penna is covering for him this weekend, sister voiced understanding.  RN paged Dr. Robb Matar and reported patient's agitation and paranoia and reviewed with him patient's elevated bilirubin and most recent VS.  Dr. Robb Matar came to see patient and new orders received.  P.J. Zorah Backes,RN

## 2018-09-29 ENCOUNTER — Inpatient Hospital Stay (HOSPITAL_COMMUNITY): Payer: Medicaid Other

## 2018-09-29 LAB — CBC WITH DIFFERENTIAL/PLATELET
Abs Immature Granulocytes: 0.08 10*3/uL — ABNORMAL HIGH (ref 0.00–0.07)
BASOS PCT: 0 %
Basophils Absolute: 0 10*3/uL (ref 0.0–0.1)
EOS ABS: 0.3 10*3/uL (ref 0.0–0.5)
Eosinophils Relative: 4 %
HCT: 30.3 % — ABNORMAL LOW (ref 39.0–52.0)
Hemoglobin: 9.8 g/dL — ABNORMAL LOW (ref 13.0–17.0)
Immature Granulocytes: 1 %
Lymphocytes Relative: 18 %
Lymphs Abs: 1.3 10*3/uL (ref 0.7–4.0)
MCH: 30.3 pg (ref 26.0–34.0)
MCHC: 32.3 g/dL (ref 30.0–36.0)
MCV: 93.8 fL (ref 80.0–100.0)
Monocytes Absolute: 1 10*3/uL (ref 0.1–1.0)
Monocytes Relative: 14 %
Neutro Abs: 4.6 10*3/uL (ref 1.7–7.7)
Neutrophils Relative %: 63 %
Platelets: 121 10*3/uL — ABNORMAL LOW (ref 150–400)
RBC: 3.23 MIL/uL — ABNORMAL LOW (ref 4.22–5.81)
RDW: 14.7 % (ref 11.5–15.5)
WBC: 7.3 10*3/uL (ref 4.0–10.5)
nRBC: 0 % (ref 0.0–0.2)

## 2018-09-29 LAB — COMPREHENSIVE METABOLIC PANEL
ALT: 29 U/L (ref 0–44)
AST: 48 U/L — ABNORMAL HIGH (ref 15–41)
Albumin: 2.2 g/dL — ABNORMAL LOW (ref 3.5–5.0)
Alkaline Phosphatase: 66 U/L (ref 38–126)
Anion gap: 6 (ref 5–15)
BUN: 43 mg/dL — ABNORMAL HIGH (ref 8–23)
CO2: 25 mmol/L (ref 22–32)
Calcium: 8.2 mg/dL — ABNORMAL LOW (ref 8.9–10.3)
Chloride: 109 mmol/L (ref 98–111)
Creatinine, Ser: 1.76 mg/dL — ABNORMAL HIGH (ref 0.61–1.24)
GFR calc Af Amer: 47 mL/min — ABNORMAL LOW (ref >60)
GFR calc non Af Amer: 41 mL/min — ABNORMAL LOW (ref >60)
Glucose, Bld: 61 mg/dL — ABNORMAL LOW (ref 70–99)
Potassium: 3.4 mmol/L — ABNORMAL LOW (ref 3.5–5.1)
Sodium: 140 mmol/L (ref 135–145)
Total Bilirubin: 7.5 mg/dL — ABNORMAL HIGH (ref 0.3–1.2)
Total Protein: 5.4 g/dL — ABNORMAL LOW (ref 6.5–8.1)

## 2018-09-29 LAB — GLUCOSE, CAPILLARY
Glucose-Capillary: 110 mg/dL — ABNORMAL HIGH (ref 70–99)
Glucose-Capillary: 187 mg/dL — ABNORMAL HIGH (ref 70–99)
Glucose-Capillary: 67 mg/dL — ABNORMAL LOW (ref 70–99)
Glucose-Capillary: 69 mg/dL — ABNORMAL LOW (ref 70–99)
Glucose-Capillary: 70 mg/dL (ref 70–99)
Glucose-Capillary: 89 mg/dL (ref 70–99)

## 2018-09-29 MED ORDER — IOPAMIDOL (ISOVUE-300) INJECTION 61%
30.0000 mL | Freq: Once | INTRAVENOUS | Status: AC | PRN
  Administered 2018-09-29: 50 mL via ORAL

## 2018-09-29 MED ORDER — POTASSIUM CHLORIDE CRYS ER 20 MEQ PO TBCR
40.0000 meq | EXTENDED_RELEASE_TABLET | Freq: Once | ORAL | Status: AC
  Administered 2018-09-29: 40 meq via ORAL
  Filled 2018-09-29: qty 2

## 2018-09-29 MED ORDER — SODIUM CHLORIDE 0.45 % IV SOLN: INTRAVENOUS | Status: DC

## 2018-09-29 MED ORDER — POTASSIUM CHLORIDE IN NACL 20-0.45 MEQ/L-% IV SOLN
INTRAVENOUS | Status: AC
  Administered 2018-09-29: 22:00:00 via INTRAVENOUS

## 2018-09-29 MED ORDER — IOPAMIDOL (ISOVUE-300) INJECTION 61%: 75.0000 mL | Freq: Once | INTRAVENOUS | Status: DC | PRN

## 2018-09-29 MED ORDER — IIOPAMIDOL (ISOVUE-250) INJECTION 51%: 100.0000 mL | Freq: Once | INTRAVENOUS | Status: DC | PRN

## 2018-09-29 MED ORDER — DEXTROSE 5 % IV SOLN
INTRAVENOUS | Status: DC
  Administered 2018-09-30: via INTRAVENOUS

## 2018-09-29 MED ORDER — IOHEXOL 300 MG/ML  SOLN
75.0000 mL | Freq: Once | INTRAMUSCULAR | Status: AC | PRN
  Administered 2018-09-29: 75 mL via INTRAVENOUS

## 2018-09-29 NOTE — Progress Notes (Signed)
Inpatient Diabetes Program Recommendations  AACE/ADA: New Consensus Statement on Inpatient Glycemic Control (2015)  Target Ranges:  Prepandial:   less than 140 mg/dL      Peak postprandial:   less than 180 mg/dL (1-2 hours)      Critically ill patients:  140 - 180 mg/dL   Lab Results  Component Value Date   GLUCAP 69 (L) 09/29/2018    Review of Glycemic Control Results for [name] [name], [name] [name] (MRN [id number]) as of 09/29/2018 10:26  Ref. Range 09/28/2018 07:18 09/28/2018 11:54 09/28/2018 16:26 09/28/2018 20:54 09/29/2018 07:40 09/29/2018 08:00  Glucose-Capillary Latest Ref Range: 70 - 99 mg/dL 681 (H) 157 (H) 262 (H) 82 67 (L) 69 (L)   Inpatient Diabetes Program Recommendations:   -D/C Amaryl while in the hospital and reassess prior to D/C home -Decrease Novolog correction to sensitive tid + hs 0-5  Thank you, Billy Fischer. Anaysia Germer, RN, MSN, CDE  Diabetes Coordinator Inpatient Glycemic Control Team Team Pager [phone] (8am-5pm) 09/29/2018 10:27 AM

## 2018-09-29 NOTE — Progress Notes (Signed)
  Subjective:  Patient has no complaints this morning.  He denies shoulder or abdominal pain.  He states his stool has been loose.  His Sister Tessie Fass was at bedside states he had 4 stools the last 24 hours.  No melena or rectal bleeding reported.  According to her he was having hallucinations over the weekend but not today.  Objective: Blood pressure (!) 141/51, pulse 72, temperature 98 F (36.7 C), temperature source Oral, resp. rate 16, height '5\' 11"'$  (1.803 m), weight 108.9 kg, SpO2 96 %. Patient is alert and oriented to place person and time. Does not have asterixis. Conjunctiva is pink. Sclera is icteric Cardiac exam with regular rhythm normal S1 and S2.  No murmur gallop noted. Auscultation of lungs reveal vesicular breath sounds bilaterally. Abdomen is full.  Bowel sounds are normal.  On palpation it somewhat tense but not tender.  No organomegaly or masses. No LE edema or clubbing noted.  Labs/studies Results:  CBC Latest Ref Rng & Units 09/29/2018 09/28/2018 09/27/2018  WBC 4.0 - 10.5 K/uL 7.3 6.4 11.3(H)  Hemoglobin 13.0 - 17.0 g/dL 9.8(L) 10.0(L) 11.2(L)  Hematocrit 39.0 - 52.0 % 30.3(L) 31.1(L) 34.4(L)  Platelets 150 - 400 K/uL 121(L) 109(L) 108(L)    CMP Latest Ref Rng & Units 09/29/2018 09/28/2018 09/27/2018  Glucose 70 - 99 mg/dL 61(L) 193(H) 133(H)  BUN 8 - 23 mg/dL 43(H) 60(H) 54(H)  Creatinine 0.61 - 1.24 mg/dL 1.76(H) 2.15(H) 2.56(H)  Sodium 135 - 145 mmol/L 140 137 141  Potassium 3.5 - 5.1 mmol/L 3.4(L) 3.8 5.0  Chloride 98 - 111 mmol/L 109 106 107  CO2 22 - 32 mmol/L 25 21(L) 22  Calcium 8.9 - 10.3 mg/dL 8.2(L) 8.3(L) 8.9  Total Protein 6.5 - 8.1 g/dL 5.4(L) 6.1(L) 6.9  Total Bilirubin 0.3 - 1.2 mg/dL 7.5(H) 12.0(H) 14.6(H)  Alkaline Phos 38 - 126 U/L 66 66 80  AST 15 - 41 U/L 48(H) 56(H) 56(H)  ALT 0 - 44 U/L 29 36 38    Hepatic Function Latest Ref Rng & Units 09/29/2018 09/28/2018 09/27/2018  Total Protein 6.5 - 8.1 g/dL 5.4(L) 6.1(L) 6.9  Albumin 3.5 - 5.0 g/dL  2.2(L) 2.6(L) 3.1(L)  AST 15 - 41 U/L 48(H) 56(H) 56(H)  ALT 0 - 44 U/L 29 36 38  Alk Phosphatase 38 - 126 U/L 66 66 80  Total Bilirubin 0.3 - 1.2 mg/dL 7.5(H) 12.0(H) 14.6(H)  Bilirubin, Direct 0.0 - 0.2 mg/dL - 7.9(H) 9.3(H)      Assessment:  #1.  Acute calculus cholecystitis with perforation.  Day 5 on IV antibiotic.  Patient is now pain-free and abdominal exam does not reveal tenderness.  He will need a follow-up CT to document that inflammatory processes resolving and so is the hemoperitoneum.  HIDA scan may not be helpful given cholestasis.  #2.  Cholestasis appears to be due to infection/and the toxemia.  Hemoperitoneum may also be contributing to rising bilirubin.  Reassuring to note that bilirubin has dropped significantly from a peak of over 15 two days ago.  #3.  Mental status changes.  He is not confused this morning.  Suspect mental status changes multifactorial including hepatic encephalopathy.  #4.  Advanced cryptogenic cirrhosis.  INR 2 days ago was normal.  Patient at risk for ascites due to decompensation secondary to acute illness.  #4.  Anemia secondary to acute illness and hemoperitoneum.   Recommendations:  Consider advancing diet. Follow-up CT; timing to be determined by Dr. Arnoldo Morale.

## 2018-09-29 NOTE — Progress Notes (Signed)
  Subjective: Patient denies any abdominal pain.  His hallucinations seem to have resolved.  Much more comfortable.  Objective: Vital signs in last 24 hours: Temp:  [98 F (36.7 C)-100.1 F (37.8 C)] 98 F (36.7 C) (02/24 0631) Pulse Rate:  [72-88] 72 (02/24 0631) Resp:  [16] 16 (02/24 0631) BP: (119-160)/(51-60) 141/51 (02/24 0631) SpO2:  [95 %-97 %] 96 % (02/24 0631) Last BM Date: 09/24/18  Intake/Output from previous day: 02/23 0701 - 02/24 0700 In: 2691 [P.O.:480; I.V.:400; IV Piggyback:1811] Out: 200 [Urine:200] Intake/Output this shift: Total I/O In: 240 [P.O.:240] Out: -   General appearance: alert, cooperative and no distress GI: soft, non-tender; bowel sounds normal; no masses,  no organomegaly  Lab Results:  Recent Labs    09/28/18 0625 09/29/18 0442  WBC 6.4 7.3  HGB 10.0* 9.8*  HCT 31.1* 30.3*  PLT 109* 121*   BMET Recent Labs    09/28/18 0625 09/29/18 0442  NA 137 140  K 3.8 3.4*  CL 106 109  CO2 21* 25  GLUCOSE 193* 61*  BUN 60* 43*  CREATININE 2.15* 1.76*  CALCIUM 8.3* 8.2*   PT/INR Recent Labs    09/27/18 0630 09/28/18 0625  LABPROT 13.6 14.0  INR 1.05 1.09    Studies/Results: No results found.  Anti-infectives: Anti-infectives (From admission, onward)   Start     Dose/Rate Route Frequency Ordered Stop   09/28/18 1000  piperacillin-tazobactam (ZOSYN) IVPB 3.375 g     3.375 g 12.5 mL/hr over 240 Minutes Intravenous Every 8 hours 09/28/18 0836     09/28/18 0500  metroNIDAZOLE (FLAGYL) IVPB 500 mg  Status:  Discontinued     500 mg 100 mL/hr over 60 Minutes Intravenous Every 8 hours 09/28/18 0447 09/28/18 0454   09/25/18 1000  piperacillin-tazobactam (ZOSYN) IVPB 3.375 g  Status:  Discontinued     3.375 g 12.5 mL/hr over 240 Minutes Intravenous Every 8 hours 09/25/18 0249 09/28/18 0447   09/25/18 0600  piperacillin-tazobactam (ZOSYN) IVPB 3.375 g  Status:  Discontinued     3.375 g 100 mL/hr over 30 Minutes Intravenous Every 8  hours 09/25/18 0245 09/25/18 0250   09/25/18 0215  piperacillin-tazobactam (ZOSYN) IVPB 3.375 g     3.375 g 100 mL/hr over 30 Minutes Intravenous  Once 09/25/18 0206 09/25/18 0246      Assessment/Plan: Impression: Perforated cholecystitis, cholelithiasis, cirrhosis.  Bilirubin is dropping.  White count is normal.  Hemoglobin is stable.  Overall, patient is improving. Plan: CT scan of the abdomen will be done today.  Further management is pending those results.  LOS: 4 days    Franky Macho 09/29/2018

## 2018-09-29 NOTE — Progress Notes (Signed)
C/O nausea but no vomiting.  Received zofran.  Sister has been at bedside most of day

## 2018-09-29 NOTE — Progress Notes (Signed)
Patient Demographics:    [name] [name], is a 63 y.o. male, DOB - November 25, 1955, THY:[id number]  Admit date - 09/24/2018   Admitting Physician [name], MD  Outpatient Primary MD for the patient is [name], MD  LOS - 4   Chief Complaint  Patient presents with  . Abdominal Pain        Subjective:    [name] [name] today has no fevers, no emesis,  No chest pain  sister at bedside, much less confused, eating okay  Assessment  & Plan :    Principal Problem:   Acute cholecystitis Active Problems:   GERD (gastroesophageal reflux disease)   Tobacco use   Hypophosphatemia   Hypertension  Brief Summary  63 year old Caucasian male with biopsy-proven cryptogenic cirrhosis dating back to 2006 status post TIPS placement for refractory ascites a year later and history of asymptomatic cholelithiasis and on 09/25/2018 with perforated gallbladder currently on IV Zosyn, now having hospital psychosis/paranoia/hallucinations most likely due to acute illness, being in the hospital and opiate use, confusion improving   Plan:- 1)Calculus Cholecystitis with Perforation----apparently not a surgical candidate, also not a candidate for IR cholecystectomy tube placement at this time, continue IV Zosyn started 09/25/2018, surgical consult appreciated, GI consult appreciated, bilirubin is down to 7.5 from 14.6, suspect it is most likely due to reabsorption of bilirubin within abdominal cavity,   repeat CT abdomen and pelvis on 09/29/2018 with Changes as described involving the gallbladder likely due to acute hemorrhagic cholecystitis with rupture (previously known), may need HIDA scan down the road in order to help rule out ongoing bile leak, use prn opiates and PRN antiemetics  2)History of Cryptogenic Cirrhosis status post prior TIPS in 2007----no encephalopathy, monitor closely , serum ammonia is 37 on 09/28/18  3)DM2---  no recent A1c, stable, use Amaryl with breakfast, continue with sliding scale coverage      4)HTN--- stable, continue Coreg 6.25 mg twice daily, losartan 100 mg daily,  may use IV Hydralazine 10 mg  Every 4 hours Prn for systolic blood pressure over 160 mmhg  5)FEN--- per general surgeon okay to try full liquid diet, continue IV fluids until oral intake is more reliable  6)Acute Toxic Metabolic Encephalopathy--- now having hospital psychosis with paranoia/hallucinations most likely due to acute illness, being in the hospital and opiate use, this is not hepatic encephalopathy as patient is agitated rather lethargic and serum ammonia is 37 on 09/28/18, give Seroquel, taper off opiates  7)AKI----acute kidney injury due to dehydration in the setting of nausea vomiting secondary to cholecystitis compounded by Lasix and losartan use, creatinine on admission= 1.26 ,   baseline creatinine = 1.1   , creatinine is now=  1.7 (peak 2.56)   , renally adjust medications, avoid nephrotoxic agents/dehydration/hypotension, Lasix and losartan on hold  Disposition/Need for in-Hospital Stay- patient unable to be discharged at this time due to Perforated gallbladder requiring IV antibiotics, , requiring IV fluids  Code Status :  Full   Family Communication: Sister and mother at bedside  Disposition Plan  :  TBD  Consults  :  Gen Surgery/Gi---  DVT Prophylaxis  :   SCDs   Lab Results  Component Value Date   PLT 121 (L) 09/29/2018  Inpatient Medications  Scheduled Meds: . carvedilol  6.25 mg Oral BID WC  . glimepiride  2 mg Oral Q breakfast  . insulin aspart  0-15 Units Subcutaneous TID WC  . insulin aspart  0-5 Units Subcutaneous QHS  . pantoprazole  40 mg Oral BID AC  . QUEtiapine  25 mg Oral QHS   Continuous Infusions: . sodium chloride 250 mL (09/27/18 1210)  . lactated ringers 50 mL/hr at 09/29/18 0119  . piperacillin-tazobactam (ZOSYN)  IV 3.375 g (09/29/18 1657)   PRN Meds:.sodium  chloride, acetaminophen **OR** acetaminophen, hydrALAZINE, HYDROmorphone (DILAUDID) injection, iopamidol, iopamidol, ondansetron **OR** ondansetron (ZOFRAN) IV  Anti-infectives (From admission, onward)   Start     Dose/Rate Route Frequency Ordered Stop   09/28/18 1000  piperacillin-tazobactam (ZOSYN) IVPB 3.375 g     3.375 g 12.5 mL/hr over 240 Minutes Intravenous Every 8 hours 09/28/18 0836     09/28/18 0500  metroNIDAZOLE (FLAGYL) IVPB 500 mg  Status:  Discontinued     500 mg 100 mL/hr over 60 Minutes Intravenous Every 8 hours 09/28/18 0447 09/28/18 0454   09/25/18 1000  piperacillin-tazobactam (ZOSYN) IVPB 3.375 g  Status:  Discontinued     3.375 g 12.5 mL/hr over 240 Minutes Intravenous Every 8 hours 09/25/18 0249 09/28/18 0447   09/25/18 0600  piperacillin-tazobactam (ZOSYN) IVPB 3.375 g  Status:  Discontinued     3.375 g 100 mL/hr over 30 Minutes Intravenous Every 8 hours 09/25/18 0245 09/25/18 0250   09/25/18 0215  piperacillin-tazobactam (ZOSYN) IVPB 3.375 g     3.375 g 100 mL/hr over 30 Minutes Intravenous  Once 09/25/18 0206 09/25/18 0246        Objective:   Vitals:   09/28/18 2052 09/28/18 2053 09/29/18 0631 09/29/18 1801  BP: (!) 119/59 (!) 119/59 (!) 141/51 (!) 130/45  Pulse: 78 79 72 78  Resp: Temp: 100.1 F (37.8 C) 100.1 F (37.8 C) 98 F (36.7 C)   TempSrc: Oral Oral Oral   SpO2: 95% 95% 96% 95%  Weight:      Height:        Wt Readings from Last 3 Encounters:  09/24/18 108.9 kg  12/28/16 110.2 kg  11/11/14 112.6 kg     Intake/Output Summary (Last 24 hours) at 09/29/2018 1822 Last data filed at 09/29/2018 1700 Gross per 24 hour  Intake 2570 ml  Output -  Net 2570 ml     Physical Exam Patient is examined daily including today on 09/29/18 , exams remain the same as of yesterday except that has changed   Gen:- Awake Alert, less confused  HEENT:- [place].AT, +ve icterus Neck-Supple Neck,No JVD,.  Lungs-  CTAB , fair symmetrical air  movement CV- S1, S2 normal, regular Abd-  +ve B.Sounds, Abd Soft, much improved right upper quadrant tenderness, no significant rebound, no CVA area tenderness    Extremity/Skin:- No  edema, pedal pulses present  Psych-affect is flat, less disoriented   Neuro-generalized weakness, but no new focal deficits, no tremors   Data Review:   Micro Results No results found for this or any previous visit (from the past 240 hour(s)).  Radiology Reports US Abdomen Complete  Result Date: 09/25/2018 CLINICAL DATA:  Cirrhosis.  Upper abdominal pain EXAM: ABDOMEN ULTRASOUND COMPLETE COMPARISON:  None. FINDINGS: Gallbladder: There is a 10 mm in size calculus adherent in the neck of the gallbladder. Gallbladder contains significant sludge. The gallbladder wall is diffusely thickened. There is no pericholecystic fluid. No sonographic  Murphy sign noted by sonographer. Common bile duct: Diameter: 4 mm. No demonstrable intrahepatic, common hepatic, or common bile duct dilatation. Liver: No focal lesion identified. Liver has a nodular contour with diffuse increased echogenicity. A TIPS catheter is present and noted to be patent. Portal vein is patent on color Doppler imaging with normal direction of blood flow towards the liver. IVC: No abnormality visualized in visualized regions. Much of the infrahepatic inferior vena cava is obscured by gas. Pancreas: Visualized portion unremarkable. Most of the pancreas is obscured by gas. Spleen: Size and appearance within normal limits. Spleen measures 12.1 cm. Right Kidney: Length: 11.7 cm. Echogenicity within normal limits. No mass or hydronephrosis visualized. Left Kidney: Length: 12.7 cm. Echogenicity within normal limits. No mass or hydronephrosis visualized. Abdominal aorta: No aneurysm visualized in visualized regions. Much of the aorta is obscured by gas. Other findings: There is a mild degree of ascites present. IMPRESSION: 1. Gallstone adherent in the neck of the  gallbladder. Sludge throughout gallbladder. Gallbladder wall is thickened. While gallbladder wall thickening may be seen in association with ascites, this finding in association with the gallstone adherent in the neck of the gallbladder raises concern for a degree of acute cholecystitis. It may be prudent to consider nuclear medicine hepatobiliary imaging study to assess for cystic duct patency. 2. Appearance of the liver is indicative of hepatic cirrhosis. While no focal liver lesions are evident, it must be cautioned that the sensitivity of ultrasound for detection of focal liver lesions is diminished significantly in this circumstance. TIPS catheter is present, patent. The flow in the portal vein is in the anatomic direction. 3. Most of the pancreas is obscured by gas. Much of the inferior vena cava and aorta are obscured by gas. 4.  Spleen appears toward the upper limits of normal in size. Electronically Signed   By: Bretta Bang III M.D.   On: 09/25/2018 09:22   Ct Abdomen Pelvis W Contrast  Addendum Date: 09/29/2018   ADDENDUM REPORT: 09/29/2018 14:27 ADDENDUM: Critical Value/emergent results were called by telephone at the time of interpretation on 09/29/2018 at 2:27 pm to Dr. Franky Macho , who verbally acknowledged these results. Electronically Signed   By: Elberta Fortis M.D.   On: 09/29/2018 14:27   Result Date: 09/29/2018 CLINICAL DATA:  Perforated cholecystitis, cholelithiasis, cirrhosis. Bilirubin dropping. Hemoglobin stable. EXAM: CT ABDOMEN AND PELVIS WITH CONTRAST TECHNIQUE: Multidetector CT imaging of the abdomen and pelvis was performed using the standard protocol following bolus administration of intravenous contrast. CONTRAST:  50mL ISOVUE-300 IOPAMIDOL (ISOVUE-300) INJECTION 61%, 75mL OMNIPAQUE IOHEXOL 300 MG/ML SOLN COMPARISON:  CT 09/25/2018 and 12/23/2005 as well as ultrasound 09/25/2018 FINDINGS: Lower chest: Lung bases demonstrate a small amount of bilateral pleural fluid with  associated bibasilar atelectasis. Hepatobiliary: Evidence of known cirrhosis. Tips shunt unchanged. Gallbladder slightly contracted compared to the recent previous CT. Again seen is a 7 mm stone over the dependent portion of the gallbladder/gallbladder neck. There is mild increased density material within the gallbladder. There is focal discontinuity of the enhancing gallbladder wall over the fundus with increased adjacent fluid likely due to perforation. There is a 6-7 mm calcification as well as adjacent 8 mm calcification within the adjacent free fluid at least 1 of which is likely a free gallstone from the presumed gallbladder rupture. Pancreas: Normal. Spleen: Normal. Adrenals/Urinary Tract: Adrenal glands are normal. Kidneys are normal in size without hydronephrosis. Nonobstructing punctate stone over the lower pole collecting system of the right kidney. Ureters and bladder  are normal. Stomach/Bowel: Stomach is within normal. Small bowel is unremarkable. Appendix is normal. Colon is normal. Vascular/Lymphatic: Mild calcified plaque over the distal abdominal aorta. No adenopathy. Reproductive: Normal. Other: Mild to moderate ascites slightly worse. Few perisplenic varices. Musculoskeletal: Moderate degenerate changes spine with disc disease at the L4-5 and L5-S1 levels. Mild degenerative change of the hips. IMPRESSION: Changes as described involving the gallbladder likely due to acute hemorrhagic cholecystitis with rupture. Cirrhosis with mild to moderate ascites which is slightly worse. Tips shunt unchanged. Tiny bilateral pleural effusions with associated basilar atelectasis. Nonobstructing punctate stone over the lower pole collecting system of the right kidney. Aortic Atherosclerosis (ICD10-I70.0). Electronically Signed: By: Elberta Fortis M.D. On: 09/29/2018 14:18   Ct Abdomen Pelvis W Contrast  Result Date: 09/25/2018 CLINICAL DATA:  Upper abdominal pain with nausea and vomiting EXAM: CT ABDOMEN AND  PELVIS WITH CONTRAST TECHNIQUE: Multidetector CT imaging of the abdomen and pelvis was performed using the standard protocol following bolus administration of intravenous contrast. CONTRAST:  OMNIPAQUE IOHEXOL 300 MG/ML  SOLN COMPARISON:  CT 01/01/2006 FINDINGS: Lower chest: Lung bases demonstrate no acute consolidation or effusion. The heart size is within normal limits. Small hiatal hernia Hepatobiliary: Cirrhotic morphology of the liver. Tips shunt is in place. No focal hepatic abnormality or biliary dilatation. Abnormal appearance of gallbladder with high density intraluminal material and hazy edema around the gallbladder lumen. Stone at the gallbladder neck. High density fluid along the inferior hepatic margin suspicious for hemoperitoneum. Rounded calcification in the fluid, series 2, image number 27, possibly a gallstone. Pancreas: Unremarkable. No pancreatic ductal dilatation or surrounding inflammatory changes. Spleen: Normal in size without focal abnormality. Adrenals/Urinary Tract: Adrenal glands are unremarkable. Kidneys are normal, without renal calculi, focal lesion, or hydronephrosis. Bladder is unremarkable. Stomach/Bowel: Stomach is within normal limits. Appendix appears normal. No evidence of bowel wall thickening, distention, or inflammatory changes. Vascular/Lymphatic: Nonaneurysmal aorta. Mild aortic atherosclerosis. No significantly enlarged lymph nodes Reproductive: Slightly enlarged prostate with calcification Other: No free air. Small slightly hyperdense fluid within the pelvis with more moderate fluid in the right upper quadrant adjacent to the liver. Fat containing inguinal hernias. Musculoskeletal: Advanced degenerative changes of the spine. No acute osseous abnormality. IMPRESSION: 1. Abnormal appearance of the gallbladder which contains intraluminal high density material. There is hazy edema and or fluid around the gallbladder with a stone present in the gallbladder neck. Findings  could be secondary to an acute cholecystitis. Additional finding of hemorrhagic fluid along the inferior margin of the liver with rounded calcifications in the hemorrhagic fluid, question gallbladder perforation with extravasated stones. Associated hemoperitoneum. 2. Cirrhosis of the liver. 3. Small amount of hemorrhagic ascites within the pelvis. Electronically Signed   By: Jasmine Pang M.D.   On: 09/25/2018 02:02     CBC Recent Labs  Lab 09/25/18 0714 09/26/18 0538 09/27/18 0630 09/28/18 0625 09/29/18 0442  WBC 11.6* 8.8 11.3* 6.4 7.3  HGB 12.1* 11.8* 11.2* 10.0* 9.8*  HCT 36.8* 36.6* 34.4* 31.1* 30.3*  PLT 97* 97* 108* 109* 121*  MCV 94.4 95.8 95.8 94.0 93.8  MCH 31.0 30.9 31.2 30.2 30.3  MCHC 32.9 32.2 32.6 32.2 32.3  RDW 13.9 14.2 14.6 14.7 14.7  LYMPHSABS 1.0 1.0 0.9 0.7 1.3  MONOABS 1.0 1.1* 1.2* 0.7 1.0  EOSABS 0.1 0.0 0.1 0.1 0.3  BASOSABS 0.0 0.0 0.0 0.0 0.0    Chemistries  Recent Labs  Lab 09/24/18 2350 09/25/18 1610 09/26/18 0538 09/27/18 0630 09/28/18 9604 09/29/18 5409  NA 137 138 138 141 137 140  K 4.9 4.8 4.2 5.0 3.8 3.4*  CL 108 109 106 107 106 109  CO2 21* 21* 24 22 21* 25  GLUCOSE 240* 248* 191* 133* 193* 61*  BUN 18 21 27* 54* 60* 43*  CREATININE 1.26* 1.44* 1.84* 2.56* 2.15* 1.76*  CALCIUM 8.6* 8.4* 8.5* 8.9 8.3* 8.2*  MG 2.0  --   --   --   --   --   AST 35 35 43* 56* 56* 48*  ALT 40 37 41 38 36 29  ALKPHOS 132* 109 107 80 66 66  BILITOT 3.1* 2.6* 5.1* 14.6* 12.0* 7.5*   Coagulation profile Recent Labs  Lab 09/25/18 0954 09/27/18 0630 09/28/18 0625  INR 1.06 1.05 1.09    No results for input(s): DDIMER in the last 72 hours.  Cardiac Enzymes No results for input(s): CKMB, TROPONINI, MYOGLOBIN in the last 168 hours.  Invalid input(s): CK ------------------------------------------------------------------------------------------------------------------ No results found for: BNP   Shon Hale M.D on 09/29/2018 at 6:22 PM  Go to  [email or web address] - for contact info  [facility] - Office  [phone]

## 2018-09-29 NOTE — Progress Notes (Signed)
Discussed with Dr. Deanne Coffer, Interventional Radiology.  Patient will be transported to [facility] tomorrow for possible percutaneous drainage.

## 2018-09-30 ENCOUNTER — Ambulatory Visit (HOSPITAL_COMMUNITY)
Admit: 2018-09-30 | Discharge: 2018-09-30 | Disposition: A | Discharge status: Home / Self Care | Payer: Medicaid Other | Attending: General Surgery | Admitting: General Surgery

## 2018-09-30 ENCOUNTER — Encounter (HOSPITAL_COMMUNITY): Payer: Self-pay

## 2018-09-30 ENCOUNTER — Inpatient Hospital Stay (HOSPITAL_COMMUNITY): Admit: 2018-09-30 | Payer: Medicaid Other

## 2018-09-30 DIAGNOSIS — K219 Gastro-esophageal reflux disease without esophagitis: Secondary | ICD-10-CM | POA: Insufficient documentation

## 2018-09-30 DIAGNOSIS — E119 Type 2 diabetes mellitus without complications: Secondary | ICD-10-CM | POA: Insufficient documentation

## 2018-09-30 DIAGNOSIS — F17210 Nicotine dependence, cigarettes, uncomplicated: Secondary | ICD-10-CM

## 2018-09-30 DIAGNOSIS — K822 Perforation of gallbladder: Secondary | ICD-10-CM

## 2018-09-30 DIAGNOSIS — Z7984 Long term (current) use of oral hypoglycemic drugs: Secondary | ICD-10-CM | POA: Insufficient documentation

## 2018-09-30 DIAGNOSIS — Z79899 Other long term (current) drug therapy: Secondary | ICD-10-CM

## 2018-09-30 DIAGNOSIS — I10 Essential (primary) hypertension: Secondary | ICD-10-CM | POA: Insufficient documentation

## 2018-09-30 DIAGNOSIS — K651 Peritoneal abscess: Secondary | ICD-10-CM | POA: Diagnosis not present

## 2018-09-30 LAB — GLUCOSE, CAPILLARY
Glucose-Capillary: 60 mg/dL — ABNORMAL LOW (ref 70–99)
Glucose-Capillary: 132 mg/dL — ABNORMAL HIGH (ref 70–99)
Glucose-Capillary: 89 mg/dL (ref 70–99)
Glucose-Capillary: 117 mg/dL — ABNORMAL HIGH (ref 70–99)

## 2018-09-30 LAB — CBC WITH DIFFERENTIAL/PLATELET
Abs Immature Granulocytes: 0.15 10*3/uL — ABNORMAL HIGH (ref 0.00–0.07)
Basophils Absolute: 0 10*3/uL (ref 0.0–0.1)
Basophils Relative: 0 %
EOS PCT: 5 %
Eosinophils Absolute: 0.3 10*3/uL (ref 0.0–0.5)
HCT: 29.1 % — ABNORMAL LOW (ref 39.0–52.0)
Hemoglobin: 9.4 g/dL — ABNORMAL LOW (ref 13.0–17.0)
Immature Granulocytes: 2 %
LYMPHS PCT: 19 %
Lymphs Abs: 1.3 10*3/uL (ref 0.7–4.0)
MCH: 30.9 pg (ref 26.0–34.0)
MCHC: 32.3 g/dL (ref 30.0–36.0)
MCV: 95.7 fL (ref 80.0–100.0)
Monocytes Absolute: 1 10*3/uL (ref 0.1–1.0)
Monocytes Relative: 15 %
Neutro Abs: 3.8 10*3/uL (ref 1.7–7.7)
Neutrophils Relative %: 59 %
Platelets: 119 10*3/uL — ABNORMAL LOW (ref 150–400)
RBC: 3.04 MIL/uL — AB (ref 4.22–5.81)
RDW: 14.9 % (ref 11.5–15.5)
WBC: 6.6 10*3/uL (ref 4.0–10.5)
nRBC: 0 % (ref 0.0–0.2)

## 2018-09-30 LAB — COMPREHENSIVE METABOLIC PANEL
ALT: 26 U/L (ref 0–44)
ANION GAP: 5 (ref 5–15)
AST: 37 U/L (ref 15–41)
Albumin: 2.1 g/dL — ABNORMAL LOW (ref 3.5–5.0)
Alkaline Phosphatase: 68 U/L (ref 38–126)
BUN: 28 mg/dL — ABNORMAL HIGH (ref 8–23)
CO2: 24 mmol/L (ref 22–32)
Calcium: 7.9 mg/dL — ABNORMAL LOW (ref 8.9–10.3)
Chloride: 109 mmol/L (ref 98–111)
Creatinine, Ser: 1.73 mg/dL — ABNORMAL HIGH (ref 0.61–1.24)
GFR calc Af Amer: 48 mL/min — ABNORMAL LOW (ref >60)
GFR calc non Af Amer: 41 mL/min — ABNORMAL LOW (ref >60)
Glucose, Bld: 136 mg/dL — ABNORMAL HIGH (ref 70–99)
Potassium: 3.7 mmol/L (ref 3.5–5.1)
Sodium: 138 mmol/L (ref 135–145)
Total Bilirubin: 4.9 mg/dL — ABNORMAL HIGH (ref 0.3–1.2)
Total Protein: 5.3 g/dL — ABNORMAL LOW (ref 6.5–8.1)

## 2018-09-30 MED ORDER — FENTANYL CITRATE (PF) 100 MCG/2ML IJ SOLN
INTRAMUSCULAR | Status: AC
  Filled 2018-09-30: qty 2

## 2018-09-30 MED ORDER — MIDAZOLAM HCL 2 MG/2ML IJ SOLN
INTRAMUSCULAR | Status: AC | PRN
  Administered 2018-09-30: 1 mg via INTRAVENOUS
  Administered 2018-09-30: 0.5 mg via INTRAVENOUS

## 2018-09-30 MED ORDER — SODIUM CHLORIDE 0.9% FLUSH: 5.0000 mL | Freq: Three times a day (TID) | INTRAVENOUS | Status: DC

## 2018-09-30 MED ORDER — SODIUM CHLORIDE 0.45 % IV SOLN
INTRAVENOUS | Status: DC
  Administered 2018-09-30: 09:00:00 via INTRAVENOUS

## 2018-09-30 MED ORDER — MIDAZOLAM HCL 2 MG/2ML IJ SOLN
INTRAMUSCULAR | Status: AC
  Filled 2018-09-30: qty 2

## 2018-09-30 MED ORDER — FENTANYL CITRATE (PF) 100 MCG/2ML IJ SOLN
INTRAMUSCULAR | Status: AC | PRN
  Administered 2018-09-30: 25 ug via INTRAVENOUS
  Administered 2018-09-30: 50 ug via INTRAVENOUS

## 2018-09-30 NOTE — Progress Notes (Signed)
Patient ID: [name] [name], male   DOB: June 01, 1956, 63 y.o.   MRN: [id number]   Pt is scheduled for RUQ abscess drain placement  To be at [facility] asap  Pt consents for self per RN  Will return to [facility] after procedure

## 2018-09-30 NOTE — Progress Notes (Signed)
Subjective: Patient has no specific complaints.  Is alert and oriented.  Objective: Vital signs in last 24 hours: Temp:  [98.2 F (36.8 C)-98.8 F (37.1 C)] 98.8 F (37.1 C) (02/25 0455) Pulse Rate:  [70-78] 70 (02/25 0455) Resp:  [18] 18 (02/25 0455) BP: (119-132)/(45-74) 119/74 (02/25 0455) SpO2:  [94 %-95 %] 94 % (02/25 0455) Last BM Date: 09/29/18  Intake/Output from previous day: 02/24 0701 - 02/25 0700 In: 1527.9 [P.O.:720; I.V.:650.9; IV Piggyback:157.1] Out: -  Intake/Output this shift: No intake/output data recorded.  General appearance: alert, cooperative and no distress GI: Soft, rotund.  No rigidity is noted.  Possible discomfort to deep palpation in the right upper quadrant and epigastric region.  Lab Results:  Recent Labs    09/29/18 0442 09/30/18 0437  WBC 7.3 6.6  HGB 9.8* 9.4*  HCT 30.3* 29.1*  PLT 121* 119*   BMET Recent Labs    09/29/18 0442 09/30/18 0437  NA 140 138  K 3.4* 3.7  CL 109 109  CO2 25 24  GLUCOSE 61* 136*  BUN 43* 28*  CREATININE 1.76* 1.73*  CALCIUM 8.2* 7.9*   PT/INR Recent Labs    09/28/18 0625  LABPROT 14.0  INR 1.09    Studies/Results: Ct Abdomen Pelvis W Contrast  Addendum Date: 09/29/2018   ADDENDUM REPORT: 09/29/2018 14:27 ADDENDUM: Critical Value/emergent results were called by telephone at the time of interpretation on 09/29/2018 at 2:27 pm to Dr. Franky Macho , who verbally acknowledged these results. Electronically Signed   By: Elberta Fortis M.D.   On: 09/29/2018 14:27   Result Date: 09/29/2018 CLINICAL DATA:  Perforated cholecystitis, cholelithiasis, cirrhosis. Bilirubin dropping. Hemoglobin stable. EXAM: CT ABDOMEN AND PELVIS WITH CONTRAST TECHNIQUE: Multidetector CT imaging of the abdomen and pelvis was performed using the standard protocol following bolus administration of intravenous contrast. CONTRAST:  19mL ISOVUE-300 IOPAMIDOL (ISOVUE-300) INJECTION 61%, 1mL OMNIPAQUE IOHEXOL 300 MG/ML SOLN  COMPARISON:  CT 09/25/2018 and 12/23/2005 as well as ultrasound 09/25/2018 FINDINGS: Lower chest: Lung bases demonstrate a small amount of bilateral pleural fluid with associated bibasilar atelectasis. Hepatobiliary: Evidence of known cirrhosis. Tips shunt unchanged. Gallbladder slightly contracted compared to the recent previous CT. Again seen is a 7 mm stone over the dependent portion of the gallbladder/gallbladder neck. There is mild increased density material within the gallbladder. There is focal discontinuity of the enhancing gallbladder wall over the fundus with increased adjacent fluid likely due to perforation. There is a 6-7 mm calcification as well as adjacent 8 mm calcification within the adjacent free fluid at least 1 of which is likely a free gallstone from the presumed gallbladder rupture. Pancreas: Normal. Spleen: Normal. Adrenals/Urinary Tract: Adrenal glands are normal. Kidneys are normal in size without hydronephrosis. Nonobstructing punctate stone over the lower pole collecting system of the right kidney. Ureters and bladder are normal. Stomach/Bowel: Stomach is within normal. Small bowel is unremarkable. Appendix is normal. Colon is normal. Vascular/Lymphatic: Mild calcified plaque over the distal abdominal aorta. No adenopathy. Reproductive: Normal. Other: Mild to moderate ascites slightly worse. Few perisplenic varices. Musculoskeletal: Moderate degenerate changes spine with disc disease at the L4-5 and L5-S1 levels. Mild degenerative change of the hips. IMPRESSION: Changes as described involving the gallbladder likely due to acute hemorrhagic cholecystitis with rupture. Cirrhosis with mild to moderate ascites which is slightly worse. Tips shunt unchanged. Tiny bilateral pleural effusions with associated basilar atelectasis. Nonobstructing punctate stone over the lower pole collecting system of the right kidney. Aortic Atherosclerosis (ICD10-I70.0). Electronically Signed: By:  Elberta Fortis  M.D. On: 09/29/2018 14:18    Anti-infectives: Anti-infectives (From admission, onward)   Start     Dose/Rate Route Frequency Ordered Stop   09/28/18 1000  piperacillin-tazobactam (ZOSYN) IVPB 3.375 g     3.375 g 12.5 mL/hr over 240 Minutes Intravenous Every 8 hours 09/28/18 0836     09/28/18 0500  metroNIDAZOLE (FLAGYL) IVPB 500 mg  Status:  Discontinued     500 mg 100 mL/hr over 60 Minutes Intravenous Every 8 hours 09/28/18 0447 09/28/18 0454   09/25/18 1000  piperacillin-tazobactam (ZOSYN) IVPB 3.375 g  Status:  Discontinued     3.375 g 12.5 mL/hr over 240 Minutes Intravenous Every 8 hours 09/25/18 0249 09/28/18 0447   09/25/18 0600  piperacillin-tazobactam (ZOSYN) IVPB 3.375 g  Status:  Discontinued     3.375 g 100 mL/hr over 30 Minutes Intravenous Every 8 hours 09/25/18 0245 09/25/18 0250   09/25/18 0215  piperacillin-tazobactam (ZOSYN) IVPB 3.375 g     3.375 g 100 mL/hr over 30 Minutes Intravenous  Once 09/25/18 0206 09/25/18 0246      Assessment/Plan: Impression: Perforated cholecystitis, cholelithiasis, cirrhosis Plan: For percutaneous drainage of right upper quadrant today.  Further management is pending those results.  LOS: 5 days    Franky Macho 09/30/2018

## 2018-09-30 NOTE — Progress Notes (Signed)
Patient Demographics:    [name] [name], is a 63 y.o. male, DOB - March 30, 1956, UUV:[id number]  Admit date - 09/24/2018   Admitting Physician [name], MD  Outpatient Primary MD for the patient is [name], MD  LOS - 5   Chief Complaint  Patient presents with  . 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 for refractory ascites a year later and history of asymptomatic cholelithiasis and on 09/25/2018 with perforated gallbladder currently on IV Zosyn, now having hospital psychosis/paranoia/hallucinations most likely due to acute illness, being in the hospital and opiate use, more coherent, s/p CT Guided Drainage of Pericholecystic/Perihepatic Abscess on 09/30/18  Plan:- 1)Calculus Cholecystitis with Perforation----apparently not a surgical candidate, s/p CT Guided Drainage of Pericholecystic/Perihepatic Abscess on 09/30/18, continue IV Zosyn started 09/25/2018, surgical consult appreciated, GI consult appreciated, bilirubin is down to 7.5 from 14.6, suspect it is most likely due to reabsorption of bilirubin within abdominal cavity,   repeat CT abdomen and pelvis on 09/29/2018 with Changes as described involving the gallbladder likely due to acute hemorrhagic cholecystitis with rupture (previously known), may need HIDA scan down the road in order to help rule out ongoing bile leak, use prn opiates and PRN antiemetics   2)History of Cryptogenic Cirrhosis status post prior TIPS in 2007----no encephalopathy, monitor  closely , serum ammonia is 37 on 09/28/18  3)DM2--- no recent A1c, stable, use Amaryl with breakfast, continue with sliding scale coverage      4)HTN--- stable, continue Coreg 6.25 mg twice daily, losartan 100 mg daily,  may use IV Hydralazine 10 mg  Every 4 hours Prn for systolic blood pressure over 160 mmhg  5)FEN--- per general surgeon diet advanced,   6)Acute Toxic Metabolic Encephalopathy--- Now resolved, suspect patient was having hospital psychosis with paranoia/hallucinations most likely due to acute illness, being in the hospital and opiate use, this is not hepatic encephalopathy as patient is agitated rather lethargic and serum ammonia is 37 on 09/28/18, much improved on Seroquel, after taper of opiates  7)AKI----acute kidney injury due to dehydration in the setting of nausea vomiting secondary to cholecystitis compounded by Lasix and losartan use, creatinine on admission= 1.26 ,   baseline creatinine = 1.1   , creatinine is now=  1.7 (peak 2.56)   , renally adjust medications, avoid nephrotoxic agents/dehydration/hypotension, Lasix and losartan on hold  Disposition/Need for in-Hospital Stay- patient unable to be discharged at this time due to Perforated gallbladder requiring IV antibiotics, , requiring IV fluids  Code Status :  Full   Family Communication: Sister and mother at bedside  Disposition Plan  :  TBD--- as per surgical and GI team  Procedures-  CT Guided Drainage of Pericholecystic/Perihepatic Abscess on 09/30/18  Consults  :  Gen Surgery/Gi---  DVT Prophylaxis  :   SCDs  Lab Results  Component Value Date   PLT 119 (L) 09/30/2018    Inpatient Medications  Scheduled Meds: . carvedilol  6.25 mg Oral BID WC  . pantoprazole  40 mg Oral BID AC  . QUEtiapine  25 mg Oral QHS   Continuous Infusions: . sodium chloride 75 mL/hr at 09/30/18 1638  . sodium chloride 250 mL (09/27/18 1210)  . dextrose 125 mL/hr at 09/30/18 0300  . piperacillin-tazobactam (ZOSYN)  IV  3.375 g (09/30/18 1639)   PRN Meds:.sodium chloride, acetaminophen **OR** acetaminophen, hydrALAZINE, HYDROmorphone (DILAUDID) injection, iopamidol, iopamidol, ondansetron **OR** ondansetron (ZOFRAN) IV  Anti-infectives (From admission, onward)   Start     Dose/Rate Route Frequency Ordered Stop   09/28/18 1000  piperacillin-tazobactam (ZOSYN) IVPB 3.375 g     3.375 g 12.5 mL/hr over 240 Minutes Intravenous Every 8 hours 09/28/18 0836     09/28/18 0500  metroNIDAZOLE (FLAGYL) IVPB 500 mg  Status:  Discontinued     500 mg 100 mL/hr over 60 Minutes Intravenous Every 8 hours 09/28/18 0447 09/28/18 0454   09/25/18 1000  piperacillin-tazobactam (ZOSYN) IVPB 3.375 g  Status:  Discontinued     3.375 g 12.5 mL/hr over 240 Minutes Intravenous Every 8 hours 09/25/18 0249 09/28/18 0447   09/25/18 0600  piperacillin-tazobactam (ZOSYN) IVPB 3.375 g  Status:  Discontinued     3.375 g 100 mL/hr over 30 Minutes Intravenous Every 8 hours 09/25/18 0245 09/25/18 0250   09/25/18 0215  piperacillin-tazobactam (ZOSYN) IVPB 3.375 g     3.375 g 100 mL/hr over 30 Minutes Intravenous  Once 09/25/18 0206 09/25/18 0246        Objective:   Vitals:   09/29/18 0631 09/29/18 1801 09/29/18 2245 09/30/18 0455  BP: (!) 141/51 (!) 130/45 (!) 132/52 119/74  Pulse: 72 78 75 70  Resp: Temp: 98 F (36.7 C)  98.2 F (36.8 C) 98.8 F (37.1 C)  TempSrc: Oral  Oral Oral  SpO2: 96% 95% 95% 94%  Weight:      Height:        Wt Readings from Last 3 Encounters:  09/24/18 108.9 kg  12/28/16 110.2 kg  11/11/14 112.6 kg     Intake/Output Summary (Last 24 hours) at 09/30/2018 1859 Last data filed at 09/30/2018 1815 Gross per 24 hour  Intake 1345.41 ml  Output 145 ml  Net 1200.41 ml     Physical Exam Patient is examined daily including today on 09/30/18 , exams remain the same as of yesterday except that has changed   Gen:- Awake Alert, less confused  HEENT:- [place].AT, +ve icterus Neck-Supple Neck,No  JVD,.  Lungs-  CTAB , fair symmetrical air movement CV- S1, S2 normal, regular Abd-  +ve B.Sounds, Abd Soft, much improved right upper quadrant tenderness, no significant rebound, no CVA area tenderness , drain site clean dry and intact Extremity/Skin:- No  edema, pedal pulses present  Psych-affect is appropriate, alert and oriented x3  neuro-generalized weakness, but no new focal deficits, no tremors   Data Review:   Micro Results Recent Results (from the past 240 hour(s))  Aerobic/Anaerobic Culture (surgical/deep wound)     Status: None (Preliminary result)   Collection Time: 09/30/18  1:37 PM  Result Value Ref Range Status   Specimen Description ABSCESS  Final   Special Requests NONE  Final   Gram Stain   Final    FEW WBC PRESENT, PREDOMINANTLY PMN NO ORGANISMS SEEN Performed at [facility], [address] [place], [place] [address]    Culture PENDING  Incomplete   Report Status PENDING  Incomplete    Radiology Reports US Abdomen Complete  Result Date: 09/25/2018 CLINICAL DATA:  Cirrhosis.  Upper abdominal pain EXAM: ABDOMEN ULTRASOUND COMPLETE COMPARISON:  None. FINDINGS: Gallbladder: There is a 10 mm in size calculus adherent in the neck of the gallbladder. Gallbladder contains significant sludge. The gallbladder wall is diffusely thickened. There is no pericholecystic fluid. No sonographic Murphy sign noted by sonographer. Common bile duct: Diameter: 4 mm. No demonstrable intrahepatic, common hepatic, or common bile duct dilatation. Liver: No focal lesion identified. Liver has a nodular contour with diffuse increased echogenicity. A TIPS catheter is present and noted to be patent. Portal vein is patent on color Doppler imaging with normal direction of blood flow towards the liver. IVC: No abnormality visualized in visualized regions. Much of the infrahepatic inferior vena cava is obscured by gas. Pancreas: Visualized portion unremarkable. Most of the pancreas is obscured by  gas. Spleen: Size and appearance within normal limits. Spleen measures 12.1 cm. Right Kidney: Length: 11.7 cm. Echogenicity within normal limits. No mass or hydronephrosis visualized. Left Kidney: Length: 12.7 cm. Echogenicity within normal limits. No mass or hydronephrosis visualized. Abdominal aorta: No aneurysm visualized in visualized regions. Much of the aorta is obscured by gas. Other findings: There is a mild degree of ascites present. IMPRESSION: 1. Gallstone adherent in the neck of the gallbladder. Sludge throughout gallbladder. Gallbladder wall is thickened. While gallbladder wall thickening may be seen in association with ascites, this finding in association with the gallstone adherent in the neck of the gallbladder raises concern for a degree of acute cholecystitis. It may be prudent to consider nuclear medicine hepatobiliary imaging study to assess for cystic duct patency. 2. Appearance of the liver is indicative of hepatic cirrhosis. While no focal liver lesions are evident, it must be cautioned that the sensitivity of ultrasound for detection of focal liver lesions is diminished significantly in this circumstance. TIPS catheter is present, patent. The flow in the portal vein is in the anatomic direction. 3. Most of the pancreas is obscured by gas. Much of the inferior vena cava and aorta are obscured by gas. 4.  Spleen appears toward the upper limits of normal in size. Electronically Signed   By: Bretta Bang III M.D.   On: 09/25/2018 09:22   Ct Abdomen Pelvis W Contrast  Addendum Date: 09/29/2018   ADDENDUM REPORT: 09/29/2018 14:27 ADDENDUM: Critical Value/emergent results were called by telephone at the time of interpretation on 09/29/2018 at 2:27 pm to Dr. Franky Macho , who verbally acknowledged these results. Electronically Signed   By: Elberta Fortis M.D.   On: 09/29/2018 14:27   Result Date: 09/29/2018 CLINICAL DATA:  Perforated cholecystitis, cholelithiasis, cirrhosis. Bilirubin  dropping. Hemoglobin stable. EXAM: CT ABDOMEN AND PELVIS WITH CONTRAST TECHNIQUE: Multidetector CT imaging of the abdomen and pelvis was performed using the standard protocol following bolus administration of intravenous contrast. CONTRAST:  8mL ISOVUE-300 IOPAMIDOL (ISOVUE-300) INJECTION 61%, 38mL OMNIPAQUE IOHEXOL 300 MG/ML SOLN COMPARISON:  CT 09/25/2018 and 12/23/2005 as well as ultrasound 09/25/2018 FINDINGS: Lower chest: Lung bases demonstrate a small amount of bilateral pleural fluid with associated bibasilar atelectasis. Hepatobiliary: Evidence of known cirrhosis. Tips shunt unchanged. Gallbladder slightly contracted compared to the recent previous CT. Again seen is a 7 mm stone over the dependent portion of the gallbladder/gallbladder neck. There is mild increased density material within the gallbladder. There is focal discontinuity of  the enhancing gallbladder wall over the fundus with increased adjacent fluid likely due to perforation. There is a 6-7 mm calcification as well as adjacent 8 mm calcification within the adjacent free fluid at least 1 of which is likely a free gallstone from the presumed gallbladder rupture. Pancreas: Normal. Spleen: Normal. Adrenals/Urinary Tract: Adrenal glands are normal. Kidneys are normal in size without hydronephrosis. Nonobstructing punctate stone over the lower pole collecting system of the right kidney. Ureters and bladder are normal. Stomach/Bowel: Stomach is within normal. Small bowel is unremarkable. Appendix is normal. Colon is normal. Vascular/Lymphatic: Mild calcified plaque over the distal abdominal aorta. No adenopathy. Reproductive: Normal. Other: Mild to moderate ascites slightly worse. Few perisplenic varices. Musculoskeletal: Moderate degenerate changes spine with disc disease at the L4-5 and L5-S1 levels. Mild degenerative change of the hips. IMPRESSION: Changes as described involving the gallbladder likely due to acute hemorrhagic cholecystitis with  rupture. Cirrhosis with mild to moderate ascites which is slightly worse. Tips shunt unchanged. Tiny bilateral pleural effusions with associated basilar atelectasis. Nonobstructing punctate stone over the lower pole collecting system of the right kidney. Aortic Atherosclerosis (ICD10-I70.0). Electronically Signed: By: Elberta Fortis M.D. On: 09/29/2018 14:18   Ct Abdomen Pelvis W Contrast  Result Date: 09/25/2018 CLINICAL DATA:  Upper abdominal pain with nausea and vomiting EXAM: CT ABDOMEN AND PELVIS WITH CONTRAST TECHNIQUE: Multidetector CT imaging of the abdomen and pelvis was performed using the standard protocol following bolus administration of intravenous contrast. CONTRAST:  OMNIPAQUE IOHEXOL 300 MG/ML  SOLN COMPARISON:  CT 01/01/2006 FINDINGS: Lower chest: Lung bases demonstrate no acute consolidation or effusion. The heart size is within normal limits. Small hiatal hernia Hepatobiliary: Cirrhotic morphology of the liver. Tips shunt is in place. No focal hepatic abnormality or biliary dilatation. Abnormal appearance of gallbladder with high density intraluminal material and hazy edema around the gallbladder lumen. Stone at the gallbladder neck. High density fluid along the inferior hepatic margin suspicious for hemoperitoneum. Rounded calcification in the fluid, series 2, image number 27, possibly a gallstone. Pancreas: Unremarkable. No pancreatic ductal dilatation or surrounding inflammatory changes. Spleen: Normal in size without focal abnormality. Adrenals/Urinary Tract: Adrenal glands are unremarkable. Kidneys are normal, without renal calculi, focal lesion, or hydronephrosis. Bladder is unremarkable. Stomach/Bowel: Stomach is within normal limits. Appendix appears normal. No evidence of bowel wall thickening, distention, or inflammatory changes. Vascular/Lymphatic: Nonaneurysmal aorta. Mild aortic atherosclerosis. No significantly enlarged lymph nodes Reproductive: Slightly enlarged prostate  with calcification Other: No free air. Small slightly hyperdense fluid within the pelvis with more moderate fluid in the right upper quadrant adjacent to the liver. Fat containing inguinal hernias. Musculoskeletal: Advanced degenerative changes of the spine. No acute osseous abnormality. IMPRESSION: 1. Abnormal appearance of the gallbladder which contains intraluminal high density material. There is hazy edema and or fluid around the gallbladder with a stone present in the gallbladder neck. Findings could be secondary to an acute cholecystitis. Additional finding of hemorrhagic fluid along the inferior margin of the liver with rounded calcifications in the hemorrhagic fluid, question gallbladder perforation with extravasated stones. Associated hemoperitoneum. 2. Cirrhosis of the liver. 3. Small amount of hemorrhagic ascites within the pelvis. Electronically Signed   By: Jasmine Pang M.D.   On: 09/25/2018 02:02   Ct Image Guided Drainage By Percutaneous Catheter  Result Date: 09/30/2018 CLINICAL DATA:  Evidence of gallbladder perforation by imaging with pericholecystic and perihepatic fluid collection containing at least 2 extraluminal gallstones. The patient is not a current candidate for cholecystectomy and  requires percutaneous drainage catheter placement. EXAM: CT GUIDED CATHETER DRAINAGE OF RIGHT UPPER QUADRANT PERITONEAL ABSCESS ANESTHESIA/SEDATION: 1.5 mg IV Versed 75 mcg IV Fentanyl Total Moderate Sedation Time:  20 minutes The patient's level of consciousness and physiologic status were continuously monitored during the procedure by Radiology nursing. PROCEDURE: The procedure, risks, benefits, and alternatives were explained to the patient. Questions regarding the procedure were encouraged and answered. The patient understands and consents to the procedure. A time out was performed prior to initiating the procedure. Initial CT was performed in a supine position through the upper abdomen. The right  abdominal wall was prepped with chlorhexidine in a sterile fashion, and a sterile drape was applied covering the operative field. A sterile gown and sterile gloves were used for the procedure. Local anesthesia was provided with 1% Lidocaine. An 18 gauge trocar needle was advanced under CT guidance into a right pericholecystic/perihepatic fluid collection. After confirming needle tip position, aspiration of fluid was performed and a sample sent for culture analysis. A guidewire was advanced and the needle removed. The percutaneous tract was dilated to 10 [place] and a 10 [place] percutaneous drain placed. Drainage catheter positioning was confirmed by CT. The catheter was connected to a suction bulb. The catheter was secured at the skin with a Prolene retention suture and adhesive StatLock device. COMPLICATIONS: None FINDINGS: Aspiration at the level of the pericholecystic and perihepatic fluid collection yielded dark, bilious fluid. The drain was formed in the collection and is draining well after placement. IMPRESSION: CT-guided percutaneous catheter drainage of pericholecystic/perihepatic fluid collection yielding dark, bilious fluid. A sample was sent for culture analysis. A 10 French drain was placed and attached to suction bulb drainage. Electronically Signed   By: Irish LackGlenn  Yamagata M.D.   On: 09/30/2018 14:56     CBC Recent Labs  Lab 09/26/18 0538 09/27/18 0630 09/28/18 0625 09/29/18 0442 09/30/18 0437  WBC 8.8 11.3* 6.4 7.3 6.6  HGB 11.8* 11.2* 10.0* 9.8* 9.4*  HCT 36.6* 34.4* 31.1* 30.3* 29.1*  PLT 97* 108* 109* 121* 119*  MCV 95.8 95.8 94.0 93.8 95.7  MCH 30.9 31.2 30.2 30.3 30.9  MCHC 32.2 32.6 32.2 32.3 32.3  RDW 14.2 14.6 14.7 14.7 14.9  LYMPHSABS 1.0 0.9 0.7 1.3 1.3  MONOABS 1.1* 1.2* 0.7 1.0 1.0  EOSABS 0.0 0.1 0.1 0.3 0.3  BASOSABS 0.0 0.0 0.0 0.0 0.0    Chemistries  Recent Labs  Lab 09/24/18 2350  09/26/18 0538 09/27/18 0630 09/28/18 0625 09/29/18 0442 09/30/18 0437  NA  137   < > 138 141 137 140 138  K 4.9   < > 4.2 5.0 3.8 3.4* 3.7  CL 108   < > 106 107 106 109 109  CO2 21*   < > 24 22 21* 25 24  GLUCOSE 240*   < > 191* 133* 193* 61* 136*  BUN 18   < > 27* 54* 60* 43* 28*  CREATININE 1.26*   < > 1.84* 2.56* 2.15* 1.76* 1.73*  CALCIUM 8.6*   < > 8.5* 8.9 8.3* 8.2* 7.9*  MG 2.0  --   --   --   --   --   --   AST 35   < > 43* 56* 56* 48* 37  ALT 40   < > 41 38 36 29 26  ALKPHOS 132*   < > 107 80 66 66 68  BILITOT 3.1*   < > 5.1* 14.6* 12.0* 7.5* 4.9*   < > =  values in this interval not displayed.   Coagulation profile Recent Labs  Lab 09/25/18 0954 09/27/18 0630 09/28/18 0625  INR 1.06 1.05 1.09    No results for input(s): DDIMER in the last 72 hours.  Cardiac Enzymes No results for input(s): CKMB, TROPONINI, MYOGLOBIN in the last 168 hours.  Invalid input(s): CK ------------------------------------------------------------------------------------------------------------------ No results found for: BNP   Shon Hale M.D on 09/30/2018 at 6:59 PM  Go to [email or web address] - for contact info  [facility] - Office  [phone]

## 2018-09-30 NOTE — Progress Notes (Signed)
Dressing to jp drain site dry and intact.  JP drain emptied of 35 mls dark red/brown drainage.  Sister will be helping patient and she demonstrated emptying of drain.

## 2018-09-30 NOTE — Procedures (Signed)
Interventional Radiology Procedure Note  Procedure: CT Guided Drainage of Pericholecystic/Perihepatic Abscess  Complications: None  Estimated Blood Loss: < 10 mL  Findings: 10 Fr drain placed in pericholecystic fluid collection with return of dar, bilious fluid. Fluid sample sent for culture analysis. Drain attached to suction bulb drainage.  Will follow.  Jodi Marble. Fredia Sorrow, M.D Pager:  [phone]

## 2018-09-30 NOTE — Progress Notes (Signed)
  Subjective:  Patient denies abdominal pain post percutaneous placement of drain.  Objective: Blood pressure 119/74, pulse 70, temperature 98.8 F (37.1 C), temperature source Oral, resp. rate 18, height '5\' 11"'$  (1.803 m), weight 108.9 kg, SpO2 94 %. Patient is alert and oriented to place person and time. Asterixis absent. Abdomen is full.  There is a 10 [place] drain in place.  Bilious fluid noted in container.  Abdomen is nontender to palpation.  Labs/studies Results:   CBC Latest Ref Rng & Units 09/30/2018 09/29/2018 09/28/2018  WBC 4.0 - 10.5 K/uL 6.6 7.3 6.4  Hemoglobin 13.0 - 17.0 g/dL 9.4(L) 9.8(L) 10.0(L)  Hematocrit 39.0 - 52.0 % 29.1(L) 30.3(L) 31.1(L)  Platelets 150 - 400 K/uL 119(L) 121(L) 109(L)    CMP Latest Ref Rng & Units 09/30/2018 09/29/2018 09/28/2018  Glucose 70 - 99 mg/dL 136(H) 61(L) 193(H)  BUN 8 - 23 mg/dL 28(H) 43(H) 60(H)  Creatinine 0.61 - 1.24 mg/dL 1.73(H) 1.76(H) 2.15(H)  Sodium 135 - 145 mmol/L 138 140 137  Potassium 3.5 - 5.1 mmol/L 3.7 3.4(L) 3.8  Chloride 98 - 111 mmol/L 109 109 106  CO2 22 - 32 mmol/L 24 25 21(L)  Calcium 8.9 - 10.3 mg/dL 7.9(L) 8.2(L) 8.3(L)  Total Protein 6.5 - 8.1 g/dL 5.3(L) 5.4(L) 6.1(L)  Total Bilirubin 0.3 - 1.2 mg/dL 4.9(H) 7.5(H) 12.0(H)  Alkaline Phos 38 - 126 U/L 68 66 66  AST 15 - 41 U/L 37 48(H) 56(H)  ALT 0 - 44 U/L 26 29 36    Hepatic Function Latest Ref Rng & Units 09/30/2018 09/29/2018 09/28/2018  Total Protein 6.5 - 8.1 g/dL 5.3(L) 5.4(L) 6.1(L)  Albumin 3.5 - 5.0 g/dL 2.1(L) 2.2(L) 2.6(L)  AST 15 - 41 U/L 37 48(H) 56(H)  ALT 0 - 44 U/L 26 29 36  Alk Phosphatase 38 - 126 U/L 68 66 66  Total Bilirubin 0.3 - 1.2 mg/dL 4.9(H) 7.5(H) 12.0(H)  Bilirubin, Direct 0.0 - 0.2 mg/dL - - 7.9(H)      Assessment:  #1.  Acute calculus cholecystitis with perforation.  CT yesterday showed increasing subhepatic fluid.  He underwent CT-guided drainage of pericholecystic fluid.  Fluid reported to be bilious.  Fluid has been sent  for cultures.  #2.  Cholestasis appears to be due to infection/and the toxemia.  Continued improvement in cholestasis bilirubin peaked to 14.6 3 days ago.  Now it is down to 4.9.  #3.  Advanced cryptogenic cirrhosis.    Recommendations:  Wait for results of culture.

## 2018-09-30 NOTE — Progress Notes (Signed)
Notified mid-level of BS 70, up to 89 after supplement but pt NPO after midnight. New order to change fluids for D5. Will continue to monitor.

## 2018-09-30 NOTE — Consult Note (Signed)
Chief Complaint: Patient was seen in consultation today for right upper quadrant abscess drain placement at the request of Jenkins,Mark  Referring Physician(s): Franky Macho  Supervising Physician: Irish Lack  Patient Status: [facility] IP  History of Present Illness: [name] [name] is a 63 y.o. male   Cryptogenic cirrhosis 2006; post TIPS 2007 To ED 2/20 with Abd pain; N/V and fever  2/20 CT:  IMPRESSION: 1. Abnormal appearance of the gallbladder which contains intraluminal high density material. There is hazy edema and or fluid around the gallbladder with a stone present in the gallbladder neck. Findings could be secondary to an acute cholecystitis. Additional finding of hemorrhagic fluid along the inferior margin of the liver with rounded calcifications in the hemorrhagic fluid, question gallbladder perforation with extravasated stones. Associated hemoperitoneum. 2. Cirrhosis of the liver. 3. Small amount of hemorrhagic ascites within the pelvis.  Not a surgical candidate at this time per Dr Lovell Sheehan secondary cirrhosis Requesting percutaneous drainage of collection  Past Medical History:  Diagnosis Date  . Anemia   . Cryptogenic cirrhosis (HCC)   . Diabetes mellitus   . GERD (gastroesophageal reflux disease)   . Hypertension   . Liver disease     Past Surgical History:  Procedure Laterality Date  . COLONOSCOPY    . INCISION AND DRAINAGE OF WOUND Right 12/28/2016   Procedure: IRRIGATION AND DEBRIDEMENT WOUND;  Surgeon: Betha Loa, MD;  Location: [facility];  Service: Orthopedics;  Laterality: Right;  . LIVER BIOPSY    . NERVE, TENDON AND ARTERY REPAIR Right 12/28/2016   Procedure: NERVE, TENDON AND ARTERY REPAIR;  Surgeon: Betha Loa, MD;  Location: [facility];  Service: Orthopedics;  Laterality: Right;  . PERCUTANEOUS PINNING Right 12/28/2016   Procedure: PERCUTANEOUS PINNING EXTREMITY;  Surgeon: Betha Loa, MD;  Location:  [facility];  Service: Orthopedics;  Laterality: Right;  . TIPS PROCEDURE  2008  . UPPER GASTROINTESTINAL ENDOSCOPY      Allergies: Patient has no known allergies.  Medications: Prior to Admission medications   Medication Sig Start Date End Date Taking? Authorizing Provider  carvedilol (COREG) 6.25 MG tablet Take 6.25 mg by mouth 2 (two) times daily with a meal.    [provider]  Cyanocobalamin (VITAMIN B-12 IJ) Inject as directed once a week. Currently on starter - for 6 weeks then monthly.Currently in 4 th week    [provider]  furosemide (LASIX) 40 MG tablet TAKE 1 TABLET BY MOUTH EVERYDAY OR EVERY OTHER DAY. 09/03/14   Setzer, Brand Males, NP  glimepiride (AMARYL) 4 MG tablet Take 4 mg by mouth daily before breakfast.    [provider]  losartan (COZAAR) 100 MG tablet Take 100 mg by mouth daily.    [provider]  omeprazole (PRILOSEC) 20 MG capsule TAKE 1 CAPSULE BY MOUTH EVERY MORNING FOR ACID REFLUX. 07/05/14   Rehman, Joline Maxcy, MD  oxyCODONE (ROXICODONE) 5 MG immediate release tablet 1-2 tabs po q6 hours prn pain Patient not taking: Reported on 09/25/2018 12/28/16   Betha Loa, MD  sulfamethoxazole-trimethoprim (BACTRIM DS) 800-160 MG tablet Take 1 tablet by mouth 2 (two) times daily. Patient not taking: Reported on 09/25/2018 12/28/16   Betha Loa, MD     Family History  Problem Relation Age of Onset  . Healthy Mother   . Diabetes Father   . Heart disease Father   . Healthy Sister   . Healthy Sister     Social History  Socioeconomic History  . Marital status: Single    Spouse name: Not on file  . Number of children: Not on file  . Years of education: Not on file  . Highest education level: Not on file  Occupational History  . Not on file  Social Needs  . Financial resource strain: Not on file  . Food insecurity:    Worry: Not on file    Inability: Not on file  . Transportation needs:    Medical: Not on file      Non-medical: Not on file  Tobacco Use  . Smoking status: Current Every Day Smoker    Packs/day: 1.00    Years: 40.00    Pack years: 40.00    Types: Cigarettes  . Smokeless tobacco: Never Used  . Tobacco comment: patient states that in the 40 years he has stopped smoking then start again  Substance and Sexual Activity  . Alcohol use: No    Alcohol/week: 0.0 standard drinks  . Drug use: No  . Sexual activity: Not on file  Lifestyle  . Physical activity:    Days per week: Not on file    Minutes per session: Not on file  . Stress: Not on file  Relationships  . Social connections:    Talks on phone: Not on file    Gets together: Not on file    Attends religious service: Not on file    Active member of club or organization: Not on file    Attends meetings of clubs or organizations: Not on file    Relationship status: Not on file  Other Topics Concern  . Not on file  Social History Narrative  . Not on file    Review of Systems: A 12 point ROS discussed and pertinent positives are indicated in the HPI above.  All other systems are negative.  Review of Systems  Constitutional: Positive for activity change, appetite change and fatigue. Negative for fever.  Respiratory: Negative for cough and shortness of breath.   Cardiovascular: Negative for chest pain.  Gastrointestinal: Positive for abdominal pain, nausea and vomiting.  Neurological: Positive for weakness.  Psychiatric/Behavioral: Negative for behavioral problems and confusion.    Vital Signs: There were no vitals taken for this visit.  Physical Exam Vitals signs reviewed.  Cardiovascular:     Rate and Rhythm: Normal rate and regular rhythm.     Heart sounds: Normal heart sounds.  Pulmonary:     Effort: Pulmonary effort is normal.     Breath sounds: Normal breath sounds.  Abdominal:     General: Bowel sounds are normal.     Tenderness: There is abdominal tenderness.  Musculoskeletal: Normal range of motion.   Skin:    General: Skin is warm and dry.  Neurological:     Mental Status: He is alert and oriented to person, place, and time.  Psychiatric:        Mood and Affect: Mood normal.        Behavior: Behavior normal.        Thought Content: Thought content normal.        Judgment: Judgment normal.     Imaging: US Abdomen Complete  Result Date: 09/25/2018 CLINICAL DATA:  Cirrhosis.  Upper abdominal pain EXAM: ABDOMEN ULTRASOUND COMPLETE COMPARISON:  None. FINDINGS: Gallbladder: There is a 10 mm in size calculus adherent in the neck of the gallbladder. Gallbladder contains significant sludge. The gallbladder wall is diffusely thickened. There is no pericholecystic fluid. No sonographic Eulah Pont  sign noted by sonographer. Common bile duct: Diameter: 4 mm. No demonstrable intrahepatic, common hepatic, or common bile duct dilatation. Liver: No focal lesion identified. Liver has a nodular contour with diffuse increased echogenicity. A TIPS catheter is present and noted to be patent. Portal vein is patent on color Doppler imaging with normal direction of blood flow towards the liver. IVC: No abnormality visualized in visualized regions. Much of the infrahepatic inferior vena cava is obscured by gas. Pancreas: Visualized portion unremarkable. Most of the pancreas is obscured by gas. Spleen: Size and appearance within normal limits. Spleen measures 12.1 cm. Right Kidney: Length: 11.7 cm. Echogenicity within normal limits. No mass or hydronephrosis visualized. Left Kidney: Length: 12.7 cm. Echogenicity within normal limits. No mass or hydronephrosis visualized. Abdominal aorta: No aneurysm visualized in visualized regions. Much of the aorta is obscured by gas. Other findings: There is a mild degree of ascites present. IMPRESSION: 1. Gallstone adherent in the neck of the gallbladder. Sludge throughout gallbladder. Gallbladder wall is thickened. While gallbladder wall thickening may be seen in association with  ascites, this finding in association with the gallstone adherent in the neck of the gallbladder raises concern for a degree of acute cholecystitis. It may be prudent to consider nuclear medicine hepatobiliary imaging study to assess for cystic duct patency. 2. Appearance of the liver is indicative of hepatic cirrhosis. While no focal liver lesions are evident, it must be cautioned that the sensitivity of ultrasound for detection of focal liver lesions is diminished significantly in this circumstance. TIPS catheter is present, patent. The flow in the portal vein is in the anatomic direction. 3. Most of the pancreas is obscured by gas. Much of the inferior vena cava and aorta are obscured by gas. 4.  Spleen appears toward the upper limits of normal in size. Electronically Signed   By: Bretta Bang III M.D.   On: 09/25/2018 09:22   Ct Abdomen Pelvis W Contrast  Addendum Date: 09/29/2018   ADDENDUM REPORT: 09/29/2018 14:27 ADDENDUM: Critical Value/emergent results were called by telephone at the time of interpretation on 09/29/2018 at 2:27 pm to Dr. Franky Macho , who verbally acknowledged these results. Electronically Signed   By: Elberta Fortis M.D.   On: 09/29/2018 14:27   Result Date: 09/29/2018 CLINICAL DATA:  Perforated cholecystitis, cholelithiasis, cirrhosis. Bilirubin dropping. Hemoglobin stable. EXAM: CT ABDOMEN AND PELVIS WITH CONTRAST TECHNIQUE: Multidetector CT imaging of the abdomen and pelvis was performed using the standard protocol following bolus administration of intravenous contrast. CONTRAST:  69mL ISOVUE-300 IOPAMIDOL (ISOVUE-300) INJECTION 61%, 30mL OMNIPAQUE IOHEXOL 300 MG/ML SOLN COMPARISON:  CT 09/25/2018 and 12/23/2005 as well as ultrasound 09/25/2018 FINDINGS: Lower chest: Lung bases demonstrate a small amount of bilateral pleural fluid with associated bibasilar atelectasis. Hepatobiliary: Evidence of known cirrhosis. Tips shunt unchanged. Gallbladder slightly contracted compared to  the recent previous CT. Again seen is a 7 mm stone over the dependent portion of the gallbladder/gallbladder neck. There is mild increased density material within the gallbladder. There is focal discontinuity of the enhancing gallbladder wall over the fundus with increased adjacent fluid likely due to perforation. There is a 6-7 mm calcification as well as adjacent 8 mm calcification within the adjacent free fluid at least 1 of which is likely a free gallstone from the presumed gallbladder rupture. Pancreas: Normal. Spleen: Normal. Adrenals/Urinary Tract: Adrenal glands are normal. Kidneys are normal in size without hydronephrosis. Nonobstructing punctate stone over the lower pole collecting system of the right kidney. Ureters and bladder are  normal. Stomach/Bowel: Stomach is within normal. Small bowel is unremarkable. Appendix is normal. Colon is normal. Vascular/Lymphatic: Mild calcified plaque over the distal abdominal aorta. No adenopathy. Reproductive: Normal. Other: Mild to moderate ascites slightly worse. Few perisplenic varices. Musculoskeletal: Moderate degenerate changes spine with disc disease at the L4-5 and L5-S1 levels. Mild degenerative change of the hips. IMPRESSION: Changes as described involving the gallbladder likely due to acute hemorrhagic cholecystitis with rupture. Cirrhosis with mild to moderate ascites which is slightly worse. Tips shunt unchanged. Tiny bilateral pleural effusions with associated basilar atelectasis. Nonobstructing punctate stone over the lower pole collecting system of the right kidney. Aortic Atherosclerosis (ICD10-I70.0). Electronically Signed: By: Elberta Fortis M.D. On: 09/29/2018 14:18   Ct Abdomen Pelvis W Contrast  Result Date: 09/25/2018 CLINICAL DATA:  Upper abdominal pain with nausea and vomiting EXAM: CT ABDOMEN AND PELVIS WITH CONTRAST TECHNIQUE: Multidetector CT imaging of the abdomen and pelvis was performed using the standard protocol following bolus  administration of intravenous contrast. CONTRAST:  OMNIPAQUE IOHEXOL 300 MG/ML  SOLN COMPARISON:  CT 01/01/2006 FINDINGS: Lower chest: Lung bases demonstrate no acute consolidation or effusion. The heart size is within normal limits. Small hiatal hernia Hepatobiliary: Cirrhotic morphology of the liver. Tips shunt is in place. No focal hepatic abnormality or biliary dilatation. Abnormal appearance of gallbladder with high density intraluminal material and hazy edema around the gallbladder lumen. Stone at the gallbladder neck. High density fluid along the inferior hepatic margin suspicious for hemoperitoneum. Rounded calcification in the fluid, series 2, image number 27, possibly a gallstone. Pancreas: Unremarkable. No pancreatic ductal dilatation or surrounding inflammatory changes. Spleen: Normal in size without focal abnormality. Adrenals/Urinary Tract: Adrenal glands are unremarkable. Kidneys are normal, without renal calculi, focal lesion, or hydronephrosis. Bladder is unremarkable. Stomach/Bowel: Stomach is within normal limits. Appendix appears normal. No evidence of bowel wall thickening, distention, or inflammatory changes. Vascular/Lymphatic: Nonaneurysmal aorta. Mild aortic atherosclerosis. No significantly enlarged lymph nodes Reproductive: Slightly enlarged prostate with calcification Other: No free air. Small slightly hyperdense fluid within the pelvis with more moderate fluid in the right upper quadrant adjacent to the liver. Fat containing inguinal hernias. Musculoskeletal: Advanced degenerative changes of the spine. No acute osseous abnormality. IMPRESSION: 1. Abnormal appearance of the gallbladder which contains intraluminal high density material. There is hazy edema and or fluid around the gallbladder with a stone present in the gallbladder neck. Findings could be secondary to an acute cholecystitis. Additional finding of hemorrhagic fluid along the inferior margin of the liver with rounded  calcifications in the hemorrhagic fluid, question gallbladder perforation with extravasated stones. Associated hemoperitoneum. 2. Cirrhosis of the liver. 3. Small amount of hemorrhagic ascites within the pelvis. Electronically Signed   By: Jasmine Pang M.D.   On: 09/25/2018 02:02    Labs:  CBC: Recent Labs    09/27/18 0630 09/28/18 0625 09/29/18 0442 09/30/18 0437  WBC 11.3* 6.4 7.3 6.6  HGB 11.2* 10.0* 9.8* 9.4*  HCT 34.4* 31.1* 30.3* 29.1*  PLT 108* 109* 121* 119*    COAGS: Recent Labs    09/25/18 0954 09/27/18 0630 09/28/18 0625  INR 1.06 1.05 1.09    BMP: Recent Labs    09/27/18 0630 09/28/18 0625 09/29/18 0442 09/30/18 0437  NA 141 137 140 138  K 5.0 3.8 3.4* 3.7  CL 107 106 109 109  CO2 22 21* 25 24  GLUCOSE 133* 193* 61* 136*  BUN 54* 60* 43* 28*  CALCIUM 8.9 8.3* 8.2* 7.9*  CREATININE 2.56* 2.15*  1.76* 1.73*  GFRNONAA 26* 32* 41* 41*  GFRAA 30* 37* 47* 48*    LIVER FUNCTION TESTS: Recent Labs    09/27/18 0630 09/28/18 0625 09/29/18 0442 09/30/18 0437  BILITOT 14.6* 12.0* 7.5* 4.9*  AST 56* 56* 48* 37  ALT 38 36 29 26  ALKPHOS 80 66 66 68  PROT 6.9 6.1* 5.4* 5.3*  ALBUMIN 3.1* 2.6* 2.2* 2.1*    TUMOR MARKERS: No results for input(s): AFPTM, CEA, CA199, CHROMGRNA in the last 8760 hours.  Assessment and Plan:  Perforated gall bladder RUQ abscess collection Scheduled for abscess drain placement Plan for same today Will return to [facility] when placed Risks and benefits discussed with the patient including bleeding, infection, damage to adjacent structures, bowel perforation/fistula connection, and sepsis.  All of the patient's questions were answered, patient is agreeable to proceed. Consent signed and in chart.   Thank you for this interesting consult.  I greatly enjoyed meeting Cay Schillingsorris M Bienvenue and look forward to participating in their care.  A copy of this report was sent to the requesting provider on this date.  Electronically  Signed: Robet LeuPamela A Joshus Rogan, PA-C 09/30/2018, 10:51 AM   I spent a total of 40 Minutes    in face to face in clinical consultation, greater than 50% of which was counseling/coordinating care for RUQ abscess drain placement

## 2018-09-30 NOTE — Progress Notes (Signed)
Inpatient Diabetes Program Recommendations  AACE/ADA: New Consensus Statement on Inpatient Glycemic Control  Target Ranges:  Prepandial:   less than 140 mg/dL      Peak postprandial:   less than 180 mg/dL (1-2 hours)      Critically ill patients:  140 - 180 mg/dL  Results for [name] [name], [name] [name] (MRN [id number]) as of 09/30/2018 07:53  Ref. Range 09/30/2018 04:37  Glucose Latest Ref Range: 70 - 99 mg/dL 208 (H)   Results for [name] [name], [name] [name] (MRN [id number]) as of 09/30/2018 07:53  Ref. Range 09/29/2018 07:40 09/29/2018 08:00 09/29/2018 11:05 09/29/2018 16:45 09/29/2018 22:47 09/29/2018 23:16 09/29/2018 23:35  Glucose-Capillary Latest Ref Range: 70 - 99 mg/dL 67 (L) 69 (L)  Amaryl 2 mg 187 (H)  Novolog 3 units 110 (H) 60 (L) 70 89   Review of Glycemic Control  Diabetes history: DM2 Outpatient Diabetes medications: Amaryl 4 mg daily Current orders for Inpatient glycemic control: None  Inpatient Diabetes Program Recommendations:  Correction (SSI): Noted Amaryl and and Novolog correction discontinued on 2/24 due to hypoglycemia. Hypoglycemia likely due to Amaryl.  While inpatient, please order CBGs with Novolog 0-9 units Q4H (while NPO).  Thanks, Orlando Penner, RN, MSN, CDE Diabetes Coordinator Inpatient Diabetes Program [phone] (Team Pager from 8am to 5pm)

## 2018-10-01 DIAGNOSIS — K810 Acute cholecystitis: Secondary | ICD-10-CM

## 2018-10-01 DIAGNOSIS — I10 Essential (primary) hypertension: Secondary | ICD-10-CM

## 2018-10-01 DIAGNOSIS — Z720 Tobacco use: Secondary | ICD-10-CM

## 2018-10-01 LAB — COMPREHENSIVE METABOLIC PANEL
ALT: 25 U/L (ref 0–44)
AST: 33 U/L (ref 15–41)
Albumin: 2 g/dL — ABNORMAL LOW (ref 3.5–5.0)
Alkaline Phosphatase: 81 U/L (ref 38–126)
Anion gap: 6 (ref 5–15)
BILIRUBIN TOTAL: 4.2 mg/dL — AB (ref 0.3–1.2)
BUN: 25 mg/dL — ABNORMAL HIGH (ref 8–23)
CO2: 24 mmol/L (ref 22–32)
Calcium: 7.9 mg/dL — ABNORMAL LOW (ref 8.9–10.3)
Chloride: 109 mmol/L (ref 98–111)
Creatinine, Ser: 1.69 mg/dL — ABNORMAL HIGH (ref 0.61–1.24)
GFR calc Af Amer: 49 mL/min — ABNORMAL LOW (ref >60)
GFR, EST NON AFRICAN AMERICAN: 43 mL/min — AB (ref >60)
Glucose, Bld: 135 mg/dL — ABNORMAL HIGH (ref 70–99)
Potassium: 3.6 mmol/L (ref 3.5–5.1)
Sodium: 139 mmol/L (ref 135–145)
Total Protein: 5.3 g/dL — ABNORMAL LOW (ref 6.5–8.1)

## 2018-10-01 LAB — CBC
HEMATOCRIT: 30.8 % — AB (ref 39.0–52.0)
HEMOGLOBIN: 9.8 g/dL — AB (ref 13.0–17.0)
MCH: 30.9 pg (ref 26.0–34.0)
MCHC: 31.8 g/dL (ref 30.0–36.0)
MCV: 97.2 fL (ref 80.0–100.0)
Platelets: 113 10*3/uL — ABNORMAL LOW (ref 150–400)
RBC: 3.17 MIL/uL — ABNORMAL LOW (ref 4.22–5.81)
RDW: 14.9 % (ref 11.5–15.5)
WBC: 5.9 10*3/uL (ref 4.0–10.5)
nRBC: 0 % (ref 0.0–0.2)

## 2018-10-01 LAB — GLUCOSE, CAPILLARY
Glucose-Capillary: 128 mg/dL — ABNORMAL HIGH (ref 70–99)
Glucose-Capillary: 160 mg/dL — ABNORMAL HIGH (ref 70–99)
Glucose-Capillary: 168 mg/dL — ABNORMAL HIGH (ref 70–99)
Glucose-Capillary: 144 mg/dL — ABNORMAL HIGH (ref 70–99)

## 2018-10-01 MED ORDER — POTASSIUM CHLORIDE IN NACL 20-0.9 MEQ/L-% IV SOLN
INTRAVENOUS | Status: DC
  Administered 2018-10-01: 21:00:00 via INTRAVENOUS

## 2018-10-01 MED ORDER — HYDRALAZINE HCL 20 MG/ML IJ SOLN: 10.0000 mg | Freq: Four times a day (QID) | INTRAMUSCULAR | Status: DC | PRN

## 2018-10-01 NOTE — Progress Notes (Signed)
PROGRESS NOTE  [name] [name] WJX:[id number] DOB: 1955/10/18 DOA: 09/24/2018 PCP: [name], MD  Brief History:  63 year old male with a history of cryptogenic cirrhosis status post TIPS, diabetes mellitus type 2, GERD, hypertension, cholelithiasis presenting with 1 and half to 68-month history of intermittent abdominal pain.  Has worsened significantly in 24 hours prior to admission with increasing intensity.  He had complained of associated nausea, vomiting, and loose stools.  Initial CT of the abdomen and pelvis showed hazy edema and fluid around the gallbladder with a stone in the gallbladder neck.  There is also an additional finding of hemorrhagic fluid around the inferior margin of the liver with a round calcification in the hemorrhagic fluid with concerns for gallbladder perforation with extravasated stones.  General surgery was consulted.  The patient was felt not to be a surgical candidate.  The patient was started on Zosyn.  The patient continued to have abdominal pain.  Repeat CT of the abdomen and pelvis on 09/29/2018 revealed an acute hemorrhagic cholecystitis with rupture.  IR was consulted.  An abscess drain was placed on 09/30/2018.  Assessment/Plan: Calculus Cholecystitis with Perforation -not a surgical candidate,  -s/p CT Guided Drainage ofPericholecystic/Perihepatic Abscess on 09/30/18,  -continue IV Zosyn started 09/25/2018,  -surgical consult appreciated,  -GI consult appreciated,  -bilirubin is trending down    -repeat CT abdomen and pelvis on 09/29/2018 with Changes as described involving the gallbladder likely due to acute hemorrhagic cholecystitis with rupture (previously known),  Cryptogenic Cirrhosis  -status post prior TIPS in 2007----no encephalopathy, monitor closely ,  -serum ammonia is 37 on 09/28/18  DM2--  - no recent A1c, stable, use Amaryl with breakfast, continue with sliding scale coverage      HTN-- - stable, continue Coreg 6.25 mg twice  daily, losartan 100 mg daily,   -IV hydralazine prn SBP >180  Acute Toxic Metabolic Encephalopathy-- - Now resolved, suspect patient was having hospital psychosis with paranoia/hallucinations most likely due to acute illness, being in the hospital and opiate use, this is not hepatic encephalopathy as patient is agitated rather lethargic and serum ammonia is 37 on 09/28/18, much improved on Seroquel, after taper of opiates  Acute on chronic renal failure--CKD stage III -Baseline creatinine 1.1-1.4 -Serum creatinine peaked 2.56 -Secondary to infectious process, hemodynamic changes, and volume depletion -A.m. BMP  Thrombocytopenia -Secondary to cryptogenic cirrhosis -Monitor for signs of bleeding    Disposition Plan:   Home 2/27 if renal function stable and cultures final Family Communication:   Sister updated at bedside 2/26 -Total time spent 35 minutes.  Greater than 50% spent face to face counseling and coordinating care.   Consultants:  GI, general surgery, IR  Code Status:  FULL   DVT Prophylaxis:  SCDs   Procedures: As Listed in Progress Note Above  Antibiotics: Zosyn 2/20>>>     Subjective: Patient denies fevers, chills, headache, chest pain, dyspnea, nausea, vomiting, diarrhea, abdominal pain, dysuria, hematuria, hematochezia, and melena.   Objective: Vitals:   09/30/18 2128 10/01/18 0640 10/01/18 0817 10/01/18 1514  BP: (!) 119/45 (!) 97/37 (!) 114/47 (!) 127/44  Pulse: 66 64 63 65  Resp: Temp: 99.3 F (37.4 C) 99 F (37.2 C)  98.9 F (37.2 C)  TempSrc: Oral Oral  Oral  SpO2: 95% 94% 95% 95%  Weight:      Height:        Intake/Output Summary (Last 24 hours) at  10/01/2018 1606 Last data filed at 10/01/2018 1535 Gross per 24 hour  Intake 240 ml  Output 155 ml  Net 85 ml   Weight change:  Exam:   General:  Pt is alert, follows commands appropriately, not in acute distress  HEENT: No icterus, No thrush, No neck mass,  [place]/AT  Cardiovascular: RRR, S1/S2, no rubs, no gallops  Respiratory: CTA bilaterally, no wheezing, no crackles, no rhonchi  Abdomen: Soft/+BS, non tender, non distended, no guarding  Extremities: No edema, No lymphangitis, No petechiae, No rashes, no synovitis   Data Reviewed: I have personally reviewed following labs and imaging studies Basic Metabolic Panel: Recent Labs  Lab 09/24/18 2350  09/27/18 0630 09/28/18 0625 09/29/18 0442 09/30/18 0437 10/01/18 0436  NA 137   < > 141 137 140 138 139  K 4.9   < > 5.0 3.8 3.4* 3.7 3.6  CL 108   < > 107 106 109 109 109  CO2 21*   < > 22 21* 25 24 24   GLUCOSE 240*   < > 133* 193* 61* 136* 135*  BUN 18   < > 54* 60* 43* 28* 25*  CREATININE 1.26*   < > 2.56* 2.15* 1.76* 1.73* 1.69*  CALCIUM 8.6*   < > 8.9 8.3* 8.2* 7.9* 7.9*  MG 2.0  --   --   --   --   --   --   PHOS 2.3*  --   --   --   --   --   --    < > = values in this interval not displayed.   Liver Function Tests: Recent Labs  Lab 09/27/18 0630 09/28/18 0625 09/29/18 0442 09/30/18 0437 10/01/18 0436  AST 56* 56* 48* 37 33  ALT 38 36 29 26 25   ALKPHOS 80 66 66 68 81  BILITOT 14.6* 12.0* 7.5* 4.9* 4.2*  PROT 6.9 6.1* 5.4* 5.3* 5.3*  ALBUMIN 3.1* 2.6* 2.2* 2.1* 2.0*   Recent Labs  Lab 09/24/18 2350 09/26/18 0538  LIPASE 48 30   Recent Labs  Lab 09/28/18 0624  AMMONIA 37*   Coagulation Profile: Recent Labs  Lab 09/25/18 0954 09/27/18 0630 09/28/18 0625  INR 1.06 1.05 1.09   CBC: Recent Labs  Lab 09/26/18 0538 09/27/18 0630 09/28/18 0625 09/29/18 0442 09/30/18 0437 10/01/18 0436  WBC 8.8 11.3* 6.4 7.3 6.6 5.9  NEUTROABS 6.7 8.9* 4.9 4.6 3.8  --   HGB 11.8* 11.2* 10.0* 9.8* 9.4* 9.8*  HCT 36.6* 34.4* 31.1* 30.3* 29.1* 30.8*  MCV 95.8 95.8 94.0 93.8 95.7 97.2  PLT 97* 108* 109* 121* 119* 113*   Cardiac Enzymes: No results for input(s): CKTOTAL, CKMB, CKMBINDEX, TROPONINI in the last 168 hours. BNP: Invalid input(s): POCBNP CBG: Recent Labs   Lab 09/30/18 0738 09/30/18 1611 09/30/18 2129 10/01/18 0754 10/01/18 1121  GLUCAP 132* 89 117* 128* 160*   HbA1C: No results for input(s): HGBA1C in the last 72 hours. Urine analysis:    Component Value Date/Time   COLORURINE YELLOW 09/25/2018 1408   APPEARANCEUR CLEAR 09/25/2018 1408   LABSPEC 1.037 (H) 09/25/2018 1408   PHURINE 5.0 09/25/2018 1408   GLUCOSEU >=500 (A) 09/25/2018 1408   HGBUR NEGATIVE 09/25/2018 1408   BILIRUBINUR NEGATIVE 09/25/2018 1408   KETONESUR NEGATIVE 09/25/2018 1408   PROTEINUR NEGATIVE 09/25/2018 1408   NITRITE NEGATIVE 09/25/2018 1408   LEUKOCYTESUR NEGATIVE 09/25/2018 1408   Sepsis Labs: @LABRCNTIP (procalcitonin:4,lacticidven:4) ) Recent Results (from the past 240 hour(s))  Aerobic/Anaerobic Culture (surgical/deep  wound)     Status: None (Preliminary result)   Collection Time: 09/30/18  1:37 PM  Result Value Ref Range Status   Specimen Description ABSCESS  Final   Special Requests NONE  Final   Gram Stain   Final    FEW WBC PRESENT, PREDOMINANTLY PMN NO ORGANISMS SEEN    Culture   Final    NO GROWTH < 24 HOURS Performed at [facility], [address] [place], [place] [address]    Report Status PENDING  Incomplete     Scheduled Meds: . carvedilol  6.25 mg Oral BID WC  . pantoprazole  40 mg Oral BID AC  . QUEtiapine  25 mg Oral QHS   Continuous Infusions: . sodium chloride 50 mL/hr at 09/30/18 2214  . sodium chloride 250 mL (09/27/18 1210)  . piperacillin-tazobactam (ZOSYN)  IV 3.375 g (10/01/18 1000)    Procedures/Studies: US Abdomen Complete  Result Date: 09/25/2018 CLINICAL DATA:  Cirrhosis.  Upper abdominal pain EXAM: ABDOMEN ULTRASOUND COMPLETE COMPARISON:  None. FINDINGS: Gallbladder: There is a 10 mm in size calculus adherent in the neck of the gallbladder. Gallbladder contains significant sludge. The gallbladder wall is diffusely thickened. There is no pericholecystic fluid. No sonographic Murphy sign noted by  sonographer. Common bile duct: Diameter: 4 mm. No demonstrable intrahepatic, common hepatic, or common bile duct dilatation. Liver: No focal lesion identified. Liver has a nodular contour with diffuse increased echogenicity. A TIPS catheter is present and noted to be patent. Portal vein is patent on color Doppler imaging with normal direction of blood flow towards the liver. IVC: No abnormality visualized in visualized regions. Much of the infrahepatic inferior vena cava is obscured by gas. Pancreas: Visualized portion unremarkable. Most of the pancreas is obscured by gas. Spleen: Size and appearance within normal limits. Spleen measures 12.1 cm. Right Kidney: Length: 11.7 cm. Echogenicity within normal limits. No mass or hydronephrosis visualized. Left Kidney: Length: 12.7 cm. Echogenicity within normal limits. No mass or hydronephrosis visualized. Abdominal aorta: No aneurysm visualized in visualized regions. Much of the aorta is obscured by gas. Other findings: There is a mild degree of ascites present. IMPRESSION: 1. Gallstone adherent in the neck of the gallbladder. Sludge throughout gallbladder. Gallbladder wall is thickened. While gallbladder wall thickening may be seen in association with ascites, this finding in association with the gallstone adherent in the neck of the gallbladder raises concern for a degree of acute cholecystitis. It may be prudent to consider nuclear medicine hepatobiliary imaging study to assess for cystic duct patency. 2. Appearance of the liver is indicative of hepatic cirrhosis. While no focal liver lesions are evident, it must be cautioned that the sensitivity of ultrasound for detection of focal liver lesions is diminished significantly in this circumstance. TIPS catheter is present, patent. The flow in the portal vein is in the anatomic direction. 3. Most of the pancreas is obscured by gas. Much of the inferior vena cava and aorta are obscured by gas. 4.  Spleen appears toward the  upper limits of normal in size. Electronically Signed   By: Bretta Bang III M.D.   On: 09/25/2018 09:22   Ct Abdomen Pelvis W Contrast  Addendum Date: 09/29/2018   ADDENDUM REPORT: 09/29/2018 14:27 ADDENDUM: Critical Value/emergent results were called by telephone at the time of interpretation on 09/29/2018 at 2:27 pm to Dr. Franky Macho , who verbally acknowledged these results. Electronically Signed   By: Elberta Fortis M.D.   On: 09/29/2018 14:27  Result Date: 09/29/2018 CLINICAL DATA:  Perforated cholecystitis, cholelithiasis, cirrhosis. Bilirubin dropping. Hemoglobin stable. EXAM: CT ABDOMEN AND PELVIS WITH CONTRAST TECHNIQUE: Multidetector CT imaging of the abdomen and pelvis was performed using the standard protocol following bolus administration of intravenous contrast. CONTRAST:  53mL ISOVUE-300 IOPAMIDOL (ISOVUE-300) INJECTION 61%, 19mL OMNIPAQUE IOHEXOL 300 MG/ML SOLN COMPARISON:  CT 09/25/2018 and 12/23/2005 as well as ultrasound 09/25/2018 FINDINGS: Lower chest: Lung bases demonstrate a small amount of bilateral pleural fluid with associated bibasilar atelectasis. Hepatobiliary: Evidence of known cirrhosis. Tips shunt unchanged. Gallbladder slightly contracted compared to the recent previous CT. Again seen is a 7 mm stone over the dependent portion of the gallbladder/gallbladder neck. There is mild increased density material within the gallbladder. There is focal discontinuity of the enhancing gallbladder wall over the fundus with increased adjacent fluid likely due to perforation. There is a 6-7 mm calcification as well as adjacent 8 mm calcification within the adjacent free fluid at least 1 of which is likely a free gallstone from the presumed gallbladder rupture. Pancreas: Normal. Spleen: Normal. Adrenals/Urinary Tract: Adrenal glands are normal. Kidneys are normal in size without hydronephrosis. Nonobstructing punctate stone over the lower pole collecting system of the right kidney.  Ureters and bladder are normal. Stomach/Bowel: Stomach is within normal. Small bowel is unremarkable. Appendix is normal. Colon is normal. Vascular/Lymphatic: Mild calcified plaque over the distal abdominal aorta. No adenopathy. Reproductive: Normal. Other: Mild to moderate ascites slightly worse. Few perisplenic varices. Musculoskeletal: Moderate degenerate changes spine with disc disease at the L4-5 and L5-S1 levels. Mild degenerative change of the hips. IMPRESSION: Changes as described involving the gallbladder likely due to acute hemorrhagic cholecystitis with rupture. Cirrhosis with mild to moderate ascites which is slightly worse. Tips shunt unchanged. Tiny bilateral pleural effusions with associated basilar atelectasis. Nonobstructing punctate stone over the lower pole collecting system of the right kidney. Aortic Atherosclerosis (ICD10-I70.0). Electronically Signed: By: Elberta Fortis M.D. On: 09/29/2018 14:18   Ct Abdomen Pelvis W Contrast  Result Date: 09/25/2018 CLINICAL DATA:  Upper abdominal pain with nausea and vomiting EXAM: CT ABDOMEN AND PELVIS WITH CONTRAST TECHNIQUE: Multidetector CT imaging of the abdomen and pelvis was performed using the standard protocol following bolus administration of intravenous contrast. CONTRAST:  OMNIPAQUE IOHEXOL 300 MG/ML  SOLN COMPARISON:  CT 01/01/2006 FINDINGS: Lower chest: Lung bases demonstrate no acute consolidation or effusion. The heart size is within normal limits. Small hiatal hernia Hepatobiliary: Cirrhotic morphology of the liver. Tips shunt is in place. No focal hepatic abnormality or biliary dilatation. Abnormal appearance of gallbladder with high density intraluminal material and hazy edema around the gallbladder lumen. Stone at the gallbladder neck. High density fluid along the inferior hepatic margin suspicious for hemoperitoneum. Rounded calcification in the fluid, series 2, image number 27, possibly a gallstone. Pancreas: Unremarkable. No  pancreatic ductal dilatation or surrounding inflammatory changes. Spleen: Normal in size without focal abnormality. Adrenals/Urinary Tract: Adrenal glands are unremarkable. Kidneys are normal, without renal calculi, focal lesion, or hydronephrosis. Bladder is unremarkable. Stomach/Bowel: Stomach is within normal limits. Appendix appears normal. No evidence of bowel wall thickening, distention, or inflammatory changes. Vascular/Lymphatic: Nonaneurysmal aorta. Mild aortic atherosclerosis. No significantly enlarged lymph nodes Reproductive: Slightly enlarged prostate with calcification Other: No free air. Small slightly hyperdense fluid within the pelvis with more moderate fluid in the right upper quadrant adjacent to the liver. Fat containing inguinal hernias. Musculoskeletal: Advanced degenerative changes of the spine. No acute osseous abnormality. IMPRESSION: 1. Abnormal appearance of the gallbladder which  contains intraluminal high density material. There is hazy edema and or fluid around the gallbladder with a stone present in the gallbladder neck. Findings could be secondary to an acute cholecystitis. Additional finding of hemorrhagic fluid along the inferior margin of the liver with rounded calcifications in the hemorrhagic fluid, question gallbladder perforation with extravasated stones. Associated hemoperitoneum. 2. Cirrhosis of the liver. 3. Small amount of hemorrhagic ascites within the pelvis. Electronically Signed   By: Jasmine PangKim  Fujinaga M.D.   On: 09/25/2018 02:02   Ct Image Guided Drainage By Percutaneous Catheter  Result Date: 09/30/2018 CLINICAL DATA:  Evidence of gallbladder perforation by imaging with pericholecystic and perihepatic fluid collection containing at least 2 extraluminal gallstones. The patient is not a current candidate for cholecystectomy and requires percutaneous drainage catheter placement. EXAM: CT GUIDED CATHETER DRAINAGE OF RIGHT UPPER QUADRANT PERITONEAL ABSCESS  ANESTHESIA/SEDATION: 1.5 mg IV Versed 75 mcg IV Fentanyl Total Moderate Sedation Time:  20 minutes The patient's level of consciousness and physiologic status were continuously monitored during the procedure by Radiology nursing. PROCEDURE: The procedure, risks, benefits, and alternatives were explained to the patient. Questions regarding the procedure were encouraged and answered. The patient understands and consents to the procedure. A time out was performed prior to initiating the procedure. Initial CT was performed in a supine position through the upper abdomen. The right abdominal wall was prepped with chlorhexidine in a sterile fashion, and a sterile drape was applied covering the operative field. A sterile gown and sterile gloves were used for the procedure. Local anesthesia was provided with 1% Lidocaine. An 18 gauge trocar needle was advanced under CT guidance into a right pericholecystic/perihepatic fluid collection. After confirming needle tip position, aspiration of fluid was performed and a sample sent for culture analysis. A guidewire was advanced and the needle removed. The percutaneous tract was dilated to 10 [place] and a 10 [place] percutaneous drain placed. Drainage catheter positioning was confirmed by CT. The catheter was connected to a suction bulb. The catheter was secured at the skin with a Prolene retention suture and adhesive StatLock device. COMPLICATIONS: None FINDINGS: Aspiration at the level of the pericholecystic and perihepatic fluid collection yielded dark, bilious fluid. The drain was formed in the collection and is draining well after placement. IMPRESSION: CT-guided percutaneous catheter drainage of pericholecystic/perihepatic fluid collection yielding dark, bilious fluid. A sample was sent for culture analysis. A 10 French drain was placed and attached to suction bulb drainage. Electronically Signed   By: Irish LackGlenn  Yamagata M.D.   On: 09/30/2018 14:56    Catarina Hartshornavid Ventura Hollenbeck, DO  [facility] Pager [phone]  If 7PM-7AM, please contact night-coverage [email or web address] Password [email or web address] 10/01/2018, 4:06 PM   LOS: 6 days

## 2018-10-01 NOTE — Progress Notes (Signed)
Subjective: Patient has no complaints this morning.  Objective: Vital signs in last 24 hours: Temp:  [99 F (37.2 C)-99.3 F (37.4 C)] 99 F (37.2 C) (02/26 0640) Pulse Rate:  [62-66] 63 (02/26 0817) Resp:  [15-19] 19 (02/26 0817) BP: (97-137)/(37-71) 114/47 (02/26 0817) SpO2:  [94 %-97 %] 95 % (02/26 0817) Last BM Date: 09/30/18  Intake/Output from previous day: 02/25 0701 - 02/26 0700 In: 937.5 [I.V.:937.5] Out: 205 [Drains:205] Intake/Output this shift: No intake/output data recorded.  General appearance: alert, cooperative and no distress GI: Soft with minimal tenderness at the exit site of the drainage catheter.  Serosanguineous bilious fluid is noted in the bulb.  Lab Results:  Recent Labs    09/30/18 0437 10/01/18 0436  WBC 6.6 5.9  HGB 9.4* 9.8*  HCT 29.1* 30.8*  PLT 119* 113*   BMET Recent Labs    09/30/18 0437 10/01/18 0436  NA 138 139  K 3.7 3.6  CL 109 109  CO2 24 24  GLUCOSE 136* 135*  BUN 28* 25*  CREATININE 1.73* 1.69*  CALCIUM 7.9* 7.9*   PT/INR No results for input(s): LABPROT, INR in the last 72 hours.  Studies/Results: Ct Abdomen Pelvis W Contrast  Addendum Date: 09/29/2018   ADDENDUM REPORT: 09/29/2018 14:27 ADDENDUM: Critical Value/emergent results were called by telephone at the time of interpretation on 09/29/2018 at 2:27 pm to Dr. Franky Macho , who verbally acknowledged these results. Electronically Signed   By: Elberta Fortis M.D.   On: 09/29/2018 14:27   Result Date: 09/29/2018 CLINICAL DATA:  Perforated cholecystitis, cholelithiasis, cirrhosis. Bilirubin dropping. Hemoglobin stable. EXAM: CT ABDOMEN AND PELVIS WITH CONTRAST TECHNIQUE: Multidetector CT imaging of the abdomen and pelvis was performed using the standard protocol following bolus administration of intravenous contrast. CONTRAST:  62mL ISOVUE-300 IOPAMIDOL (ISOVUE-300) INJECTION 61%, 35mL OMNIPAQUE IOHEXOL 300 MG/ML SOLN COMPARISON:  CT 09/25/2018 and 12/23/2005 as well  as ultrasound 09/25/2018 FINDINGS: Lower chest: Lung bases demonstrate a small amount of bilateral pleural fluid with associated bibasilar atelectasis. Hepatobiliary: Evidence of known cirrhosis. Tips shunt unchanged. Gallbladder slightly contracted compared to the recent previous CT. Again seen is a 7 mm stone over the dependent portion of the gallbladder/gallbladder neck. There is mild increased density material within the gallbladder. There is focal discontinuity of the enhancing gallbladder wall over the fundus with increased adjacent fluid likely due to perforation. There is a 6-7 mm calcification as well as adjacent 8 mm calcification within the adjacent free fluid at least 1 of which is likely a free gallstone from the presumed gallbladder rupture. Pancreas: Normal. Spleen: Normal. Adrenals/Urinary Tract: Adrenal glands are normal. Kidneys are normal in size without hydronephrosis. Nonobstructing punctate stone over the lower pole collecting system of the right kidney. Ureters and bladder are normal. Stomach/Bowel: Stomach is within normal. Small bowel is unremarkable. Appendix is normal. Colon is normal. Vascular/Lymphatic: Mild calcified plaque over the distal abdominal aorta. No adenopathy. Reproductive: Normal. Other: Mild to moderate ascites slightly worse. Few perisplenic varices. Musculoskeletal: Moderate degenerate changes spine with disc disease at the L4-5 and L5-S1 levels. Mild degenerative change of the hips. IMPRESSION: Changes as described involving the gallbladder likely due to acute hemorrhagic cholecystitis with rupture. Cirrhosis with mild to moderate ascites which is slightly worse. Tips shunt unchanged. Tiny bilateral pleural effusions with associated basilar atelectasis. Nonobstructing punctate stone over the lower pole collecting system of the right kidney. Aortic Atherosclerosis (ICD10-I70.0). Electronically Signed: By: Elberta Fortis M.D. On: 09/29/2018 14:18   Ct Image  Guided  Drainage By Percutaneous Catheter  Result Date: 09/30/2018 CLINICAL DATA:  Evidence of gallbladder perforation by imaging with pericholecystic and perihepatic fluid collection containing at least 2 extraluminal gallstones. The patient is not a current candidate for cholecystectomy and requires percutaneous drainage catheter placement. EXAM: CT GUIDED CATHETER DRAINAGE OF RIGHT UPPER QUADRANT PERITONEAL ABSCESS ANESTHESIA/SEDATION: 1.5 mg IV Versed 75 mcg IV Fentanyl Total Moderate Sedation Time:  20 minutes The patient's level of consciousness and physiologic status were continuously monitored during the procedure by Radiology nursing. PROCEDURE: The procedure, risks, benefits, and alternatives were explained to the patient. Questions regarding the procedure were encouraged and answered. The patient understands and consents to the procedure. A time out was performed prior to initiating the procedure. Initial CT was performed in a supine position through the upper abdomen. The right abdominal wall was prepped with chlorhexidine in a sterile fashion, and a sterile drape was applied covering the operative field. A sterile gown and sterile gloves were used for the procedure. Local anesthesia was provided with 1% Lidocaine. An 18 gauge trocar needle was advanced under CT guidance into a right pericholecystic/perihepatic fluid collection. After confirming needle tip position, aspiration of fluid was performed and a sample sent for culture analysis. A guidewire was advanced and the needle removed. The percutaneous tract was dilated to 10 [place] and a 10 [place] percutaneous drain placed. Drainage catheter positioning was confirmed by CT. The catheter was connected to a suction bulb. The catheter was secured at the skin with a Prolene retention suture and adhesive StatLock device. COMPLICATIONS: None FINDINGS: Aspiration at the level of the pericholecystic and perihepatic fluid collection yielded dark, bilious fluid. The  drain was formed in the collection and is draining well after placement. IMPRESSION: CT-guided percutaneous catheter drainage of pericholecystic/perihepatic fluid collection yielding dark, bilious fluid. A sample was sent for culture analysis. A 10 French drain was placed and attached to suction bulb drainage. Electronically Signed   By: Irish Lack M.D.   On: 09/30/2018 14:56    Anti-infectives: Anti-infectives (From admission, onward)   Start     Dose/Rate Route Frequency Ordered Stop   09/28/18 1000  piperacillin-tazobactam (ZOSYN) IVPB 3.375 g     3.375 g 12.5 mL/hr over 240 Minutes Intravenous Every 8 hours 09/28/18 0836     09/28/18 0500  metroNIDAZOLE (FLAGYL) IVPB 500 mg  Status:  Discontinued     500 mg 100 mL/hr over 60 Minutes Intravenous Every 8 hours 09/28/18 0447 09/28/18 0454   09/25/18 1000  piperacillin-tazobactam (ZOSYN) IVPB 3.375 g  Status:  Discontinued     3.375 g 12.5 mL/hr over 240 Minutes Intravenous Every 8 hours 09/25/18 0249 09/28/18 0447   09/25/18 0600  piperacillin-tazobactam (ZOSYN) IVPB 3.375 g  Status:  Discontinued     3.375 g 100 mL/hr over 30 Minutes Intravenous Every 8 hours 09/25/18 0245 09/25/18 0250   09/25/18 0215  piperacillin-tazobactam (ZOSYN) IVPB 3.375 g     3.375 g 100 mL/hr over 30 Minutes Intravenous  Once 09/25/18 0206 09/25/18 0246      Assessment/Plan: Impression: Status post percutaneous drainage of subhepatic space, perforated cholecystitis with cholelithiasis, cirrhosis Plan: Would monitor for another 24 hours.  Able to be discharged home and follow-up with interventional radiology for drain care.  Continue IV antibiotics for now.  LOS: 6 days    Franky Macho 10/01/2018

## 2018-10-01 NOTE — Progress Notes (Signed)
  Subjective:   Objective: Blood pressure (!) 127/44, pulse 65, temperature 98.9 F (37.2 C), temperature source Oral, resp. rate 19, height '5\' 11"'$  (1.803 m), weight 108.9 kg, SpO2 95 %. Patient is alert. He does not have asterixis. Abdomen remains full but nontender. Biliary drain in place with bilious fluid in it. Trace edema around ankles.  Biliary output 255 cc in 30 hours.  Labs/studies Results:  CBC Latest Ref Rng & Units 10/01/2018 09/30/2018 09/29/2018  WBC 4.0 - 10.5 K/uL 5.9 6.6 7.3  Hemoglobin 13.0 - 17.0 g/dL 9.8(L) 9.4(L) 9.8(L)  Hematocrit 39.0 - 52.0 % 30.8(L) 29.1(L) 30.3(L)  Platelets 150 - 400 K/uL 113(L) 119(L) 121(L)    CMP Latest Ref Rng & Units 10/01/2018 09/30/2018 09/29/2018  Glucose 70 - 99 mg/dL 135(H) 136(H) 61(L)  BUN 8 - 23 mg/dL 25(H) 28(H) 43(H)  Creatinine 0.61 - 1.24 mg/dL 1.69(H) 1.73(H) 1.76(H)  Sodium 135 - 145 mmol/L 139 138 140  Potassium 3.5 - 5.1 mmol/L 3.6 3.7 3.4(L)  Chloride 98 - 111 mmol/L 109 109 109  CO2 22 - 32 mmol/L '24 24 25  '$ Calcium 8.9 - 10.3 mg/dL 7.9(L) 7.9(L) 8.2(L)  Total Protein 6.5 - 8.1 g/dL 5.3(L) 5.3(L) 5.4(L)  Total Bilirubin 0.3 - 1.2 mg/dL 4.2(H) 4.9(H) 7.5(H)  Alkaline Phos 38 - 126 U/L 81 68 66  AST 15 - 41 U/L 33 37 48(H)  ALT 0 - 44 U/L '25 26 29    '$ Hepatic Function Latest Ref Rng & Units 10/01/2018 09/30/2018 09/29/2018  Total Protein 6.5 - 8.1 g/dL 5.3(L) 5.3(L) 5.4(L)  Albumin 3.5 - 5.0 g/dL 2.0(L) 2.1(L) 2.2(L)  AST 15 - 41 U/L 33 37 48(H)  ALT 0 - 44 U/L '25 26 29  '$ Alk Phosphatase 38 - 126 U/L 81 68 66  Total Bilirubin 0.3 - 1.2 mg/dL 4.2(H) 4.9(H) 7.5(H)  Bilirubin, Direct 0.0 - 0.2 mg/dL - - -    Bile cultures negative at 24 hours.  Assessment:  #1.  Calculus cholecystitis with perforation requiring biliary drainage.  Patient remains on IV antibiotic.  Cultures negative. He is tolerating diet.  Agree with plans for discharge in near future.  Need for further antibiotic therapy to depend on final culture  results on biliary fluid.  #2.  Cholestasis.  Continued drop in bilirubin.  #3.  Anemia secondary to acute illness.  No evidence of GI bleed.   Recommendations:  We will plan to see patient in the office in 4 weeks or so regarding his cirrhosis.

## 2018-10-01 NOTE — Progress Notes (Signed)
Pharmacy Antibiotic Note  [name] [name] is a 63 y.o. male admitted on 09/24/2018 with IAI.  Pharmacy has been consulted for ZOSYN dosing. Percutaneous drain placed 2/25of subhepatic space, perforated cholecystitis with cholelithiasis, cirrhosis .  Plan: Continue Zosyn 3.375g IV q8h (4 hour infusion).  F/u cxs and clinical progress Monitor V/S, labs  Height: 5\' 11"  (180.3 cm) Weight: 240 lb (108.9 kg) IBW/kg (Calculated) : 75.3  Temp (24hrs), Avg:99.2 F (37.3 C), Min:99 F (37.2 C), Max:99.3 F (37.4 C)  Recent Labs  Lab 09/27/18 0630 09/28/18 0625 09/29/18 0442 09/30/18 0437 10/01/18 0436  WBC 11.3* 6.4 7.3 6.6 5.9  CREATININE 2.56* 2.15* 1.76* 1.73* 1.69*    Estimated Creatinine Clearance: 56.9 mL/min (A) (by C-G formula based on SCr of 1.69 mg/dL (H)).    No Known Allergies  Antimicrobials this admission: Zosyn 2/23 >>   Dose adjustments this admission:  Microbiology results:  BCx:   UCx:    Sputum:    MRSA PCR:   Thank you for allowing pharmacy to be a part of this patient's care.  Tera Mater 10/01/2018 12:10 PM

## 2018-10-01 NOTE — Progress Notes (Signed)
Referring Physician(s): Dr Zenon Mayo  Supervising Physician: Malachy Moan  Patient Status:  [facility] IP  Chief Complaint:  Perforated GB; fluid collection Findings: yesterday: 10 Fr drain placed in pericholecystic fluid collection with return of dar, bilious fluid. Fluid sample sent for culture analysis. Drain attached to suction bulb drainage.  Subjective:  Up in chair Doing well feeling better No pain No complaints   Allergies: Patient has no known allergies.  Medications: Prior to Admission medications   Medication Sig Start Date End Date Taking? Authorizing Provider  carvedilol (COREG) 6.25 MG tablet Take 6.25 mg by mouth 2 (two) times daily with a meal.   Yes [provider]  Cyanocobalamin (VITAMIN B-12 IJ) Inject as directed once a week. Currently on starter - for 6 weeks then monthly.Currently in 4 th week   Yes [provider]  furosemide (LASIX) 40 MG tablet TAKE 1 TABLET BY MOUTH EVERYDAY OR EVERY OTHER DAY. 09/03/14  Yes Setzer, Terri L, NP  glimepiride (AMARYL) 4 MG tablet Take 4 mg by mouth daily before breakfast.   Yes [provider]  losartan (COZAAR) 100 MG tablet Take 100 mg by mouth daily.   Yes [provider]  omeprazole (PRILOSEC) 20 MG capsule TAKE 1 CAPSULE BY MOUTH EVERY MORNING FOR ACID REFLUX. 07/05/14  Yes Rehman, Joline Maxcy, MD  oxyCODONE (ROXICODONE) 5 MG immediate release tablet 1-2 tabs po q6 hours prn pain Patient not taking: Reported on 09/25/2018 12/28/16   Betha Loa, MD  sulfamethoxazole-trimethoprim (BACTRIM DS) 800-160 MG tablet Take 1 tablet by mouth 2 (two) times daily. Patient not taking: Reported on 09/25/2018 12/28/16   Betha Loa, MD     Vital Signs: BP (!) 114/47 (BP Location: Right Arm)   Pulse 63   Temp 99 F (37.2 C) (Oral)   Resp 19   Ht 5\' 11"  (1.803 m)   Wt 240 lb (108.9 kg)   SpO2 95%   BMI 33.47 kg/m   Physical Exam Vitals signs reviewed.  Skin:    General: Skin is  warm and dry.     Comments: Site is clean and dry NT no bleeding No hematoma OP bilious 20 cc in JP 200 cc OP yesterday   Neurological:     Mental Status: He is alert.     Imaging: Ct Abdomen Pelvis W Contrast  Addendum Date: 09/29/2018   ADDENDUM REPORT: 09/29/2018 14:27 ADDENDUM: Critical Value/emergent results were called by telephone at the time of interpretation on 09/29/2018 at 2:27 pm to Dr. Franky Macho , who verbally acknowledged these results. Electronically Signed   By: Elberta Fortis M.D.   On: 09/29/2018 14:27   Result Date: 09/29/2018 CLINICAL DATA:  Perforated cholecystitis, cholelithiasis, cirrhosis. Bilirubin dropping. Hemoglobin stable. EXAM: CT ABDOMEN AND PELVIS WITH CONTRAST TECHNIQUE: Multidetector CT imaging of the abdomen and pelvis was performed using the standard protocol following bolus administration of intravenous contrast. CONTRAST:  79mL ISOVUE-300 IOPAMIDOL (ISOVUE-300) INJECTION 61%, 22mL OMNIPAQUE IOHEXOL 300 MG/ML SOLN COMPARISON:  CT 09/25/2018 and 12/23/2005 as well as ultrasound 09/25/2018 FINDINGS: Lower chest: Lung bases demonstrate a small amount of bilateral pleural fluid with associated bibasilar atelectasis. Hepatobiliary: Evidence of known cirrhosis. Tips shunt unchanged. Gallbladder slightly contracted compared to the recent previous CT. Again seen is a 7 mm stone over the dependent portion of the gallbladder/gallbladder neck. There is mild increased density material within the gallbladder. There is focal discontinuity of the enhancing gallbladder wall over the fundus with increased adjacent fluid likely  due to perforation. There is a 6-7 mm calcification as well as adjacent 8 mm calcification within the adjacent free fluid at least 1 of which is likely a free gallstone from the presumed gallbladder rupture. Pancreas: Normal. Spleen: Normal. Adrenals/Urinary Tract: Adrenal glands are normal. Kidneys are normal in size without hydronephrosis.  Nonobstructing punctate stone over the lower pole collecting system of the right kidney. Ureters and bladder are normal. Stomach/Bowel: Stomach is within normal. Small bowel is unremarkable. Appendix is normal. Colon is normal. Vascular/Lymphatic: Mild calcified plaque over the distal abdominal aorta. No adenopathy. Reproductive: Normal. Other: Mild to moderate ascites slightly worse. Few perisplenic varices. Musculoskeletal: Moderate degenerate changes spine with disc disease at the L4-5 and L5-S1 levels. Mild degenerative change of the hips. IMPRESSION: Changes as described involving the gallbladder likely due to acute hemorrhagic cholecystitis with rupture. Cirrhosis with mild to moderate ascites which is slightly worse. Tips shunt unchanged. Tiny bilateral pleural effusions with associated basilar atelectasis. Nonobstructing punctate stone over the lower pole collecting system of the right kidney. Aortic Atherosclerosis (ICD10-I70.0). Electronically Signed: By: Elberta Fortis M.D. On: 09/29/2018 14:18   Ct Image Guided Drainage By Percutaneous Catheter  Result Date: 09/30/2018 CLINICAL DATA:  Evidence of gallbladder perforation by imaging with pericholecystic and perihepatic fluid collection containing at least 2 extraluminal gallstones. The patient is not a current candidate for cholecystectomy and requires percutaneous drainage catheter placement. EXAM: CT GUIDED CATHETER DRAINAGE OF RIGHT UPPER QUADRANT PERITONEAL ABSCESS ANESTHESIA/SEDATION: 1.5 mg IV Versed 75 mcg IV Fentanyl Total Moderate Sedation Time:  20 minutes The patient's level of consciousness and physiologic status were continuously monitored during the procedure by Radiology nursing. PROCEDURE: The procedure, risks, benefits, and alternatives were explained to the patient. Questions regarding the procedure were encouraged and answered. The patient understands and consents to the procedure. A time out was performed prior to initiating the  procedure. Initial CT was performed in a supine position through the upper abdomen. The right abdominal wall was prepped with chlorhexidine in a sterile fashion, and a sterile drape was applied covering the operative field. A sterile gown and sterile gloves were used for the procedure. Local anesthesia was provided with 1% Lidocaine. An 18 gauge trocar needle was advanced under CT guidance into a right pericholecystic/perihepatic fluid collection. After confirming needle tip position, aspiration of fluid was performed and a sample sent for culture analysis. A guidewire was advanced and the needle removed. The percutaneous tract was dilated to 10 [place] and a 10 [place] percutaneous drain placed. Drainage catheter positioning was confirmed by CT. The catheter was connected to a suction bulb. The catheter was secured at the skin with a Prolene retention suture and adhesive StatLock device. COMPLICATIONS: None FINDINGS: Aspiration at the level of the pericholecystic and perihepatic fluid collection yielded dark, bilious fluid. The drain was formed in the collection and is draining well after placement. IMPRESSION: CT-guided percutaneous catheter drainage of pericholecystic/perihepatic fluid collection yielding dark, bilious fluid. A sample was sent for culture analysis. A 10 French drain was placed and attached to suction bulb drainage. Electronically Signed   By: Irish Lack M.D.   On: 09/30/2018 14:56    Labs:  CBC: Recent Labs    09/28/18 0625 09/29/18 0442 09/30/18 0437 10/01/18 0436  WBC 6.4 7.3 6.6 5.9  HGB 10.0* 9.8* 9.4* 9.8*  HCT 31.1* 30.3* 29.1* 30.8*  PLT 109* 121* 119* 113*    COAGS: Recent Labs    09/25/18 0954 09/27/18 0630 09/28/18 0625  INR  1.06 1.05 1.09    BMP: Recent Labs    09/28/18 0625 09/29/18 0442 09/30/18 0437 10/01/18 0436  NA 137 140 138 139  K 3.8 3.4* 3.7 3.6  CL 106 109 109 109  CO2 21* GLUCOSE 193* 61* 136* 135*  BUN 60* 43* 28* 25*    CALCIUM 8.3* 8.2* 7.9* 7.9*  CREATININE 2.15* 1.76* 1.73* 1.69*  GFRNONAA 32* 41* 41* 43*  GFRAA 37* 47* 48* 49*    LIVER FUNCTION TESTS: Recent Labs    09/28/18 0625 09/29/18 0442 09/30/18 0437 10/01/18 0436  BILITOT 12.0* 7.5* 4.9* 4.2*  AST 56* 48* 37 33  ALT 36 ALKPHOS 66 66 68 81  PROT 6.1* 5.4* 5.3* 5.3*  ALBUMIN 2.6* 2.2* 2.1* 2.0*    Assessment and Plan:  Perforated GB RUQ fluid collection Drain placed in IR 2/25 Plan for DC tomorrow per chart Drain will ned to be flushed at home daily with 5-10 cc sterile saline Record OP daily IR will call pt with time and date of follow CT and recheck at OP Clinic He is aware and agreeable  Electronically Signed: Robet Leu, PA-C 10/01/2018, 9:51 AM   I spent a total of 15 Minutes at the the patient's bedside AND on the patient's hospital floor or unit, greater than 50% of which was counseling/coordinating care for RUQ fluid collection drain

## 2018-10-01 NOTE — Care Management Note (Signed)
Case Management Note  Patient Details  Name: JERMY ALLIE MRN: [id number] Date of Birth: 24-Jul-1956  Subjective/Objective:       Acute calculus cholecystitis, perforated gallbladder. From home, independent.            Action/Plan: Discussed home health RN with patient in regards to drain management. He declines. Reports he and his sister have been shown how to manage drain and feels confident he can manage himself.  Discussed with bedside RN to allow patient to empty bulb and flush drain as he will be doing at home.  Patient aware that home health can be ordered by PCP or surgeon after DC'd from hospital if needed.   Expected Discharge Date:     10/02/18            Expected Discharge Plan:  Home/Self Care  In-House Referral:     Discharge planning Services  CM Consult  Post Acute Care Choice:  Home Health Choice offered to:     DME Arranged:    DME Agency:     HH Arranged:  Patient Refused HH HH Agency:     Status of Service:  Completed, signed off  If discussed at [employer], dates discussed:    Additional Comments:  Aarib Pulido, Chrystine Oiler, RN 10/01/2018, 1:31 PM

## 2018-10-02 ENCOUNTER — Other Ambulatory Visit: Payer: Self-pay | Admitting: General Surgery

## 2018-10-02 DIAGNOSIS — K8000 Calculus of gallbladder with acute cholecystitis without obstruction: Secondary | ICD-10-CM

## 2018-10-02 DIAGNOSIS — K219 Gastro-esophageal reflux disease without esophagitis: Secondary | ICD-10-CM

## 2018-10-02 LAB — COMPREHENSIVE METABOLIC PANEL
ALT: 23 U/L (ref 0–44)
AST: 28 U/L (ref 15–41)
Albumin: 2 g/dL — ABNORMAL LOW (ref 3.5–5.0)
Alkaline Phosphatase: 87 U/L (ref 38–126)
Anion gap: 6 (ref 5–15)
BUN: 22 mg/dL (ref 8–23)
CALCIUM: 7.8 mg/dL — AB (ref 8.9–10.3)
CO2: 22 mmol/L (ref 22–32)
Chloride: 110 mmol/L (ref 98–111)
Creatinine, Ser: 1.6 mg/dL — ABNORMAL HIGH (ref 0.61–1.24)
GFR calc Af Amer: 53 mL/min — ABNORMAL LOW (ref >60)
GFR, EST NON AFRICAN AMERICAN: 45 mL/min — AB (ref >60)
Glucose, Bld: 88 mg/dL (ref 70–99)
Potassium: 3.5 mmol/L (ref 3.5–5.1)
Sodium: 138 mmol/L (ref 135–145)
Total Bilirubin: 3.8 mg/dL — ABNORMAL HIGH (ref 0.3–1.2)
Total Protein: 5.2 g/dL — ABNORMAL LOW (ref 6.5–8.1)

## 2018-10-02 LAB — GLUCOSE, CAPILLARY
Glucose-Capillary: 63 mg/dL — ABNORMAL LOW (ref 70–99)
Glucose-Capillary: 73 mg/dL (ref 70–99)

## 2018-10-02 MED ORDER — AMOXICILLIN-POT CLAVULANATE 875-125 MG PO TABS
1.0000 | ORAL_TABLET | Freq: Two times a day (BID) | ORAL | Status: DC
  Administered 2018-10-02: 1 via ORAL
  Filled 2018-10-02: qty 1

## 2018-10-02 MED ORDER — AMOXICILLIN-POT CLAVULANATE 875-125 MG PO TABS: 1.0000 | ORAL_TABLET | Freq: Two times a day (BID) | ORAL | 0 refills | Status: DC

## 2018-10-02 NOTE — Discharge Summary (Signed)
Physician Discharge Summary  [name] [name] ZOX:[id number] DOB: 30-Jan-1956 DOA: 09/24/2018  PCP: [name], Roy, MD  Admit date: 09/24/2018 Discharge date: 10/02/2018  Admitted From: Home Disposition:  Home   Recommendations for Outpatient Follow-up:  1. Follow up with PCP in 1-2 weeks 2. Please obtain BMP/CBC in one week     Discharge Condition: Stable CODE STATUS: FULL Diet recommendation: Heart Healthy   Brief/Interim Summary: 63 year old male with a history of cryptogenic cirrhosis status post TIPS, diabetes mellitus type 2, GERD, hypertension, cholelithiasis presenting with 1 and half to 1030-month history of intermittent abdominal pain.  Has worsened significantly in 24 hours prior to admission with increasing intensity.  He had complained of associated nausea, vomiting, and loose stools.  Initial CT of the abdomen and pelvis showed hazy edema and fluid around the gallbladder with a stone in the gallbladder neck.  There is also an additional finding of hemorrhagic fluid around the inferior margin of the liver with a round calcification in the hemorrhagic fluid with concerns for gallbladder perforation with extravasated stones.  General surgery was consulted.  The patient was felt not to be a surgical candidate.  The patient was started on Zosyn.  The patient continued to have abdominal pain.  Repeat CT of the abdomen and pelvis on 09/29/2018 revealed an acute hemorrhagic cholecystitis with rupture.  IR was consulted.  An abscess drain was placed on 09/30/2018.  Discharge Diagnoses:  Calculus Cholecystitis with Perforation -not a surgical candidate, -s/pCT Guided Drainage ofPericholecystic/Perihepatic Abscesson 09/30/18-->culture neg -continue IV Zosyn started 09/25/2018-->d/c home with amox/clav x 7 more days -surgical consult appreciated,  -GI consult appreciated,  -bilirubin is trending down  -repeat CT abdomen and pelvis on 09/29/2018 with Changes as described involving the  gallbladder likely due to acute hemorrhagic cholecystitis with rupture (previously known),  Cryptogenic Cirrhosis  -status post prior TIPS in 2007----no encephalopathy, monitor closely ,  -serum ammonia is 37 on 09/28/18  DM2--  - no recent A1c,  -stable, use Amaryl with breakfast, continue with sliding scale coverage   HTN-- - stable, continue Coreg 6.25 mg twice daily, losartan 100 mg daily,  -d/c losartan due to renal failure-->BP remained well controlled -IV hydralazine prn SBP >180  Acute Toxic Metabolic Encephalopathy-- -Nowresolved, suspect patient was havinghospital psychosis with paranoia/hallucinations most likely due to acute illness, being in the hospital and opiate use, this is not hepatic encephalopathy as patient is agitated rather lethargic and serum ammonia is 37 on 09/28/18, much improved onSeroquel -tapered opiates  Acute on chronic renal failure--CKD stage III -Baseline creatinine 1.1-1.4 -Serum creatinine peaked 2.56 -Secondary to infectious process, hemodynamic changes, and volume depletion -serum creatinine 1.60 on day of d/c -d/c losartan  Thrombocytopenia -Secondary to cryptogenic cirrhosis -Monitor for signs of bleeding  Discharge Instructions   Allergies as of 10/02/2018   No Known Allergies     Medication List    STOP taking these medications   furosemide 40 MG tablet Commonly known as:  LASIX   losartan 100 MG tablet Commonly known as:  COZAAR   oxyCODONE 5 MG immediate release tablet Commonly known as:  ROXICODONE   sulfamethoxazole-trimethoprim 800-160 MG tablet Commonly known as:  BACTRIM DS     TAKE these medications   amoxicillin-clavulanate 875-125 MG tablet Commonly known as:  AUGMENTIN Take 1 tablet by mouth every 12 (twelve) hours.   carvedilol 6.25 MG tablet Commonly known as:  COREG Take 6.25 mg by mouth 2 (two) times daily with a meal.   glimepiride 4  MG tablet Commonly known as:  AMARYL Take 4 mg by  mouth daily before breakfast.   omeprazole 20 MG capsule Commonly known as:  PRILOSEC TAKE 1 CAPSULE BY MOUTH EVERY MORNING FOR ACID REFLUX.   VITAMIN B-12 IJ Inject as directed once a week. Currently on starter - for 6 weeks then monthly.Currently in 4 th week      Follow-up Information    Irish Lack, MD Follow up in 1 week(s).   Specialties:  Interventional Radiology, Radiology Why:  pt will hear from scheduler for follow up time and date at [facility]-- call [phone] if any questions Contact information: [address] [place] [address] [phone]        Franky Macho, MD. Schedule an appointment as soon as possible for a visit in 4 week(s).   Specialty:  General Surgery Contact information: [address] [place] [address] [phone]          No Known Allergies  Consultations:  GI, general surgery   Procedures/Studies: US Abdomen Complete  Result Date: 09/25/2018 CLINICAL DATA:  Cirrhosis.  Upper abdominal pain EXAM: ABDOMEN ULTRASOUND COMPLETE COMPARISON:  None. FINDINGS: Gallbladder: There is a 10 mm in size calculus adherent in the neck of the gallbladder. Gallbladder contains significant sludge. The gallbladder wall is diffusely thickened. There is no pericholecystic fluid. No sonographic Murphy sign noted by sonographer. Common bile duct: Diameter: 4 mm. No demonstrable intrahepatic, common hepatic, or common bile duct dilatation. Liver: No focal lesion identified. Liver has a nodular contour with diffuse increased echogenicity. A TIPS catheter is present and noted to be patent. Portal vein is patent on color Doppler imaging with normal direction of blood flow towards the liver. IVC: No abnormality visualized in visualized regions. Much of the infrahepatic inferior vena cava is obscured by gas. Pancreas: Visualized portion unremarkable. Most of the pancreas is obscured by gas. Spleen: Size and appearance within normal  limits. Spleen measures 12.1 cm. Right Kidney: Length: 11.7 cm. Echogenicity within normal limits. No mass or hydronephrosis visualized. Left Kidney: Length: 12.7 cm. Echogenicity within normal limits. No mass or hydronephrosis visualized. Abdominal aorta: No aneurysm visualized in visualized regions. Much of the aorta is obscured by gas. Other findings: There is a mild degree of ascites present. IMPRESSION: 1. Gallstone adherent in the neck of the gallbladder. Sludge throughout gallbladder. Gallbladder wall is thickened. While gallbladder wall thickening may be seen in association with ascites, this finding in association with the gallstone adherent in the neck of the gallbladder raises concern for a degree of acute cholecystitis. It may be prudent to consider nuclear medicine hepatobiliary imaging study to assess for cystic duct patency. 2. Appearance of the liver is indicative of hepatic cirrhosis. While no focal liver lesions are evident, it must be cautioned that the sensitivity of ultrasound for detection of focal liver lesions is diminished significantly in this circumstance. TIPS catheter is present, patent. The flow in the portal vein is in the anatomic direction. 3. Most of the pancreas is obscured by gas. Much of the inferior vena cava and aorta are obscured by gas. 4.  Spleen appears toward the upper limits of normal in size. Electronically Signed   By: Bretta Bang III M.D.   On: 09/25/2018 09:22   Ct Abdomen Pelvis W Contrast  Addendum Date: 09/29/2018   ADDENDUM REPORT: 09/29/2018 14:27 ADDENDUM: Critical Value/emergent results were called by telephone at the time of interpretation on 09/29/2018 at 2:27 pm to Dr. Loraine Leriche  JENKINS , who verbally acknowledged these results. Electronically Signed   By: Elberta Fortis M.D.   On: 09/29/2018 14:27   Result Date: 09/29/2018 CLINICAL DATA:  Perforated cholecystitis, cholelithiasis, cirrhosis. Bilirubin dropping. Hemoglobin stable. EXAM: CT ABDOMEN AND  PELVIS WITH CONTRAST TECHNIQUE: Multidetector CT imaging of the abdomen and pelvis was performed using the standard protocol following bolus administration of intravenous contrast. CONTRAST:  21mL ISOVUE-300 IOPAMIDOL (ISOVUE-300) INJECTION 61%, 75mL OMNIPAQUE IOHEXOL 300 MG/ML SOLN COMPARISON:  CT 09/25/2018 and 12/23/2005 as well as ultrasound 09/25/2018 FINDINGS: Lower chest: Lung bases demonstrate a small amount of bilateral pleural fluid with associated bibasilar atelectasis. Hepatobiliary: Evidence of known cirrhosis. Tips shunt unchanged. Gallbladder slightly contracted compared to the recent previous CT. Again seen is a 7 mm stone over the dependent portion of the gallbladder/gallbladder neck. There is mild increased density material within the gallbladder. There is focal discontinuity of the enhancing gallbladder wall over the fundus with increased adjacent fluid likely due to perforation. There is a 6-7 mm calcification as well as adjacent 8 mm calcification within the adjacent free fluid at least 1 of which is likely a free gallstone from the presumed gallbladder rupture. Pancreas: Normal. Spleen: Normal. Adrenals/Urinary Tract: Adrenal glands are normal. Kidneys are normal in size without hydronephrosis. Nonobstructing punctate stone over the lower pole collecting system of the right kidney. Ureters and bladder are normal. Stomach/Bowel: Stomach is within normal. Small bowel is unremarkable. Appendix is normal. Colon is normal. Vascular/Lymphatic: Mild calcified plaque over the distal abdominal aorta. No adenopathy. Reproductive: Normal. Other: Mild to moderate ascites slightly worse. Few perisplenic varices. Musculoskeletal: Moderate degenerate changes spine with disc disease at the L4-5 and L5-S1 levels. Mild degenerative change of the hips. IMPRESSION: Changes as described involving the gallbladder likely due to acute hemorrhagic cholecystitis with rupture. Cirrhosis with mild to moderate ascites  which is slightly worse. Tips shunt unchanged. Tiny bilateral pleural effusions with associated basilar atelectasis. Nonobstructing punctate stone over the lower pole collecting system of the right kidney. Aortic Atherosclerosis (ICD10-I70.0). Electronically Signed: By: Elberta Fortis M.D. On: 09/29/2018 14:18   Ct Abdomen Pelvis W Contrast  Result Date: 09/25/2018 CLINICAL DATA:  Upper abdominal pain with nausea and vomiting EXAM: CT ABDOMEN AND PELVIS WITH CONTRAST TECHNIQUE: Multidetector CT imaging of the abdomen and pelvis was performed using the standard protocol following bolus administration of intravenous contrast. CONTRAST:  OMNIPAQUE IOHEXOL 300 MG/ML  SOLN COMPARISON:  CT 01/01/2006 FINDINGS: Lower chest: Lung bases demonstrate no acute consolidation or effusion. The heart size is within normal limits. Small hiatal hernia Hepatobiliary: Cirrhotic morphology of the liver. Tips shunt is in place. No focal hepatic abnormality or biliary dilatation. Abnormal appearance of gallbladder with high density intraluminal material and hazy edema around the gallbladder lumen. Stone at the gallbladder neck. High density fluid along the inferior hepatic margin suspicious for hemoperitoneum. Rounded calcification in the fluid, series 2, image number 27, possibly a gallstone. Pancreas: Unremarkable. No pancreatic ductal dilatation or surrounding inflammatory changes. Spleen: Normal in size without focal abnormality. Adrenals/Urinary Tract: Adrenal glands are unremarkable. Kidneys are normal, without renal calculi, focal lesion, or hydronephrosis. Bladder is unremarkable. Stomach/Bowel: Stomach is within normal limits. Appendix appears normal. No evidence of bowel wall thickening, distention, or inflammatory changes. Vascular/Lymphatic: Nonaneurysmal aorta. Mild aortic atherosclerosis. No significantly enlarged lymph nodes Reproductive: Slightly enlarged prostate with calcification Other: No free air. Small  slightly hyperdense fluid within the pelvis with more moderate fluid in the right upper quadrant adjacent to the  liver. Fat containing inguinal hernias. Musculoskeletal: Advanced degenerative changes of the spine. No acute osseous abnormality. IMPRESSION: 1. Abnormal appearance of the gallbladder which contains intraluminal high density material. There is hazy edema and or fluid around the gallbladder with a stone present in the gallbladder neck. Findings could be secondary to an acute cholecystitis. Additional finding of hemorrhagic fluid along the inferior margin of the liver with rounded calcifications in the hemorrhagic fluid, question gallbladder perforation with extravasated stones. Associated hemoperitoneum. 2. Cirrhosis of the liver. 3. Small amount of hemorrhagic ascites within the pelvis. Electronically Signed   By: Jasmine Pang M.D.   On: 09/25/2018 02:02   Ct Image Guided Drainage By Percutaneous Catheter  Result Date: 09/30/2018 CLINICAL DATA:  Evidence of gallbladder perforation by imaging with pericholecystic and perihepatic fluid collection containing at least 2 extraluminal gallstones. The patient is not a current candidate for cholecystectomy and requires percutaneous drainage catheter placement. EXAM: CT GUIDED CATHETER DRAINAGE OF RIGHT UPPER QUADRANT PERITONEAL ABSCESS ANESTHESIA/SEDATION: 1.5 mg IV Versed 75 mcg IV Fentanyl Total Moderate Sedation Time:  20 minutes The patient's level of consciousness and physiologic status were continuously monitored during the procedure by Radiology nursing. PROCEDURE: The procedure, risks, benefits, and alternatives were explained to the patient. Questions regarding the procedure were encouraged and answered. The patient understands and consents to the procedure. A time out was performed prior to initiating the procedure. Initial CT was performed in a supine position through the upper abdomen. The right abdominal wall was prepped with chlorhexidine in a  sterile fashion, and a sterile drape was applied covering the operative field. A sterile gown and sterile gloves were used for the procedure. Local anesthesia was provided with 1% Lidocaine. An 18 gauge trocar needle was advanced under CT guidance into a right pericholecystic/perihepatic fluid collection. After confirming needle tip position, aspiration of fluid was performed and a sample sent for culture analysis. A guidewire was advanced and the needle removed. The percutaneous tract was dilated to 10 [place] and a 10 [place] percutaneous drain placed. Drainage catheter positioning was confirmed by CT. The catheter was connected to a suction bulb. The catheter was secured at the skin with a Prolene retention suture and adhesive StatLock device. COMPLICATIONS: None FINDINGS: Aspiration at the level of the pericholecystic and perihepatic fluid collection yielded dark, bilious fluid. The drain was formed in the collection and is draining well after placement. IMPRESSION: CT-guided percutaneous catheter drainage of pericholecystic/perihepatic fluid collection yielding dark, bilious fluid. A sample was sent for culture analysis. A 10 French drain was placed and attached to suction bulb drainage. Electronically Signed   By: Irish Lack M.D.   On: 09/30/2018 14:56         Discharge Exam: Vitals:   10/01/18 2217 10/02/18 0545  BP: 131/61 (!) 117/50  Pulse: 65 63  Resp:  20  Temp:  99.1 F (37.3 C)  SpO2: 98% 95%   Vitals:   10/01/18 2008 10/01/18 2215 10/01/18 2217 10/02/18 0545  BP:  (!) 82/29 131/61 (!) 117/50  Pulse:  64 65 63  Resp:  20  20  Temp:  98.8 F (37.1 C)  99.1 F (37.3 C)  TempSrc:  Oral  Oral  SpO2: (!) 89% 98% 98% 95%  Weight:      Height:        General: Pt is alert, awake, not in acute distress Cardiovascular: RRR, S1/S2 +, no rubs, no gallops Respiratory: CTA bilaterally, no wheezing, no rhonchi Abdominal: Soft, NT, ND,  bowel sounds + Extremities: no edema, no  cyanosis   The results of significant diagnostics from this hospitalization (including imaging, microbiology, ancillary and laboratory) are listed below for reference.    Significant Diagnostic Studies: US Abdomen Complete  Result Date: 09/25/2018 CLINICAL DATA:  Cirrhosis.  Upper abdominal pain EXAM: ABDOMEN ULTRASOUND COMPLETE COMPARISON:  None. FINDINGS: Gallbladder: There is a 10 mm in size calculus adherent in the neck of the gallbladder. Gallbladder contains significant sludge. The gallbladder wall is diffusely thickened. There is no pericholecystic fluid. No sonographic Murphy sign noted by sonographer. Common bile duct: Diameter: 4 mm. No demonstrable intrahepatic, common hepatic, or common bile duct dilatation. Liver: No focal lesion identified. Liver has a nodular contour with diffuse increased echogenicity. A TIPS catheter is present and noted to be patent. Portal vein is patent on color Doppler imaging with normal direction of blood flow towards the liver. IVC: No abnormality visualized in visualized regions. Much of the infrahepatic inferior vena cava is obscured by gas. Pancreas: Visualized portion unremarkable. Most of the pancreas is obscured by gas. Spleen: Size and appearance within normal limits. Spleen measures 12.1 cm. Right Kidney: Length: 11.7 cm. Echogenicity within normal limits. No mass or hydronephrosis visualized. Left Kidney: Length: 12.7 cm. Echogenicity within normal limits. No mass or hydronephrosis visualized. Abdominal aorta: No aneurysm visualized in visualized regions. Much of the aorta is obscured by gas. Other findings: There is a mild degree of ascites present. IMPRESSION: 1. Gallstone adherent in the neck of the gallbladder. Sludge throughout gallbladder. Gallbladder wall is thickened. While gallbladder wall thickening may be seen in association with ascites, this finding in association with the gallstone adherent in the neck of the gallbladder raises concern for a  degree of acute cholecystitis. It may be prudent to consider nuclear medicine hepatobiliary imaging study to assess for cystic duct patency. 2. Appearance of the liver is indicative of hepatic cirrhosis. While no focal liver lesions are evident, it must be cautioned that the sensitivity of ultrasound for detection of focal liver lesions is diminished significantly in this circumstance. TIPS catheter is present, patent. The flow in the portal vein is in the anatomic direction. 3. Most of the pancreas is obscured by gas. Much of the inferior vena cava and aorta are obscured by gas. 4.  Spleen appears toward the upper limits of normal in size. Electronically Signed   By: Bretta Bang III M.D.   On: 09/25/2018 09:22   Ct Abdomen Pelvis W Contrast  Addendum Date: 09/29/2018   ADDENDUM REPORT: 09/29/2018 14:27 ADDENDUM: Critical Value/emergent results were called by telephone at the time of interpretation on 09/29/2018 at 2:27 pm to Dr. Franky Macho , who verbally acknowledged these results. Electronically Signed   By: Elberta Fortis M.D.   On: 09/29/2018 14:27   Result Date: 09/29/2018 CLINICAL DATA:  Perforated cholecystitis, cholelithiasis, cirrhosis. Bilirubin dropping. Hemoglobin stable. EXAM: CT ABDOMEN AND PELVIS WITH CONTRAST TECHNIQUE: Multidetector CT imaging of the abdomen and pelvis was performed using the standard protocol following bolus administration of intravenous contrast. CONTRAST:  50mL ISOVUE-300 IOPAMIDOL (ISOVUE-300) INJECTION 61%, 75mL OMNIPAQUE IOHEXOL 300 MG/ML SOLN COMPARISON:  CT 09/25/2018 and 12/23/2005 as well as ultrasound 09/25/2018 FINDINGS: Lower chest: Lung bases demonstrate a small amount of bilateral pleural fluid with associated bibasilar atelectasis. Hepatobiliary: Evidence of known cirrhosis. Tips shunt unchanged. Gallbladder slightly contracted compared to the recent previous CT. Again seen is a 7 mm stone over the dependent portion of the gallbladder/gallbladder neck.  There is mild  increased density material within the gallbladder. There is focal discontinuity of the enhancing gallbladder wall over the fundus with increased adjacent fluid likely due to perforation. There is a 6-7 mm calcification as well as adjacent 8 mm calcification within the adjacent free fluid at least 1 of which is likely a free gallstone from the presumed gallbladder rupture. Pancreas: Normal. Spleen: Normal. Adrenals/Urinary Tract: Adrenal glands are normal. Kidneys are normal in size without hydronephrosis. Nonobstructing punctate stone over the lower pole collecting system of the right kidney. Ureters and bladder are normal. Stomach/Bowel: Stomach is within normal. Small bowel is unremarkable. Appendix is normal. Colon is normal. Vascular/Lymphatic: Mild calcified plaque over the distal abdominal aorta. No adenopathy. Reproductive: Normal. Other: Mild to moderate ascites slightly worse. Few perisplenic varices. Musculoskeletal: Moderate degenerate changes spine with disc disease at the L4-5 and L5-S1 levels. Mild degenerative change of the hips. IMPRESSION: Changes as described involving the gallbladder likely due to acute hemorrhagic cholecystitis with rupture. Cirrhosis with mild to moderate ascites which is slightly worse. Tips shunt unchanged. Tiny bilateral pleural effusions with associated basilar atelectasis. Nonobstructing punctate stone over the lower pole collecting system of the right kidney. Aortic Atherosclerosis (ICD10-I70.0). Electronically Signed: By: Elberta Fortis M.D. On: 09/29/2018 14:18   Ct Abdomen Pelvis W Contrast  Result Date: 09/25/2018 CLINICAL DATA:  Upper abdominal pain with nausea and vomiting EXAM: CT ABDOMEN AND PELVIS WITH CONTRAST TECHNIQUE: Multidetector CT imaging of the abdomen and pelvis was performed using the standard protocol following bolus administration of intravenous contrast. CONTRAST:  OMNIPAQUE IOHEXOL 300 MG/ML  SOLN COMPARISON:  CT 01/01/2006  FINDINGS: Lower chest: Lung bases demonstrate no acute consolidation or effusion. The heart size is within normal limits. Small hiatal hernia Hepatobiliary: Cirrhotic morphology of the liver. Tips shunt is in place. No focal hepatic abnormality or biliary dilatation. Abnormal appearance of gallbladder with high density intraluminal material and hazy edema around the gallbladder lumen. Stone at the gallbladder neck. High density fluid along the inferior hepatic margin suspicious for hemoperitoneum. Rounded calcification in the fluid, series 2, image number 27, possibly a gallstone. Pancreas: Unremarkable. No pancreatic ductal dilatation or surrounding inflammatory changes. Spleen: Normal in size without focal abnormality. Adrenals/Urinary Tract: Adrenal glands are unremarkable. Kidneys are normal, without renal calculi, focal lesion, or hydronephrosis. Bladder is unremarkable. Stomach/Bowel: Stomach is within normal limits. Appendix appears normal. No evidence of bowel wall thickening, distention, or inflammatory changes. Vascular/Lymphatic: Nonaneurysmal aorta. Mild aortic atherosclerosis. No significantly enlarged lymph nodes Reproductive: Slightly enlarged prostate with calcification Other: No free air. Small slightly hyperdense fluid within the pelvis with more moderate fluid in the right upper quadrant adjacent to the liver. Fat containing inguinal hernias. Musculoskeletal: Advanced degenerative changes of the spine. No acute osseous abnormality. IMPRESSION: 1. Abnormal appearance of the gallbladder which contains intraluminal high density material. There is hazy edema and or fluid around the gallbladder with a stone present in the gallbladder neck. Findings could be secondary to an acute cholecystitis. Additional finding of hemorrhagic fluid along the inferior margin of the liver with rounded calcifications in the hemorrhagic fluid, question gallbladder perforation with extravasated stones. Associated  hemoperitoneum. 2. Cirrhosis of the liver. 3. Small amount of hemorrhagic ascites within the pelvis. Electronically Signed   By: Jasmine Pang M.D.   On: 09/25/2018 02:02   Ct Image Guided Drainage By Percutaneous Catheter  Result Date: 09/30/2018 CLINICAL DATA:  Evidence of gallbladder perforation by imaging with pericholecystic and perihepatic fluid collection containing at least 2 extraluminal  gallstones. The patient is not a current candidate for cholecystectomy and requires percutaneous drainage catheter placement. EXAM: CT GUIDED CATHETER DRAINAGE OF RIGHT UPPER QUADRANT PERITONEAL ABSCESS ANESTHESIA/SEDATION: 1.5 mg IV Versed 75 mcg IV Fentanyl Total Moderate Sedation Time:  20 minutes The patient's level of consciousness and physiologic status were continuously monitored during the procedure by Radiology nursing. PROCEDURE: The procedure, risks, benefits, and alternatives were explained to the patient. Questions regarding the procedure were encouraged and answered. The patient understands and consents to the procedure. A time out was performed prior to initiating the procedure. Initial CT was performed in a supine position through the upper abdomen. The right abdominal wall was prepped with chlorhexidine in a sterile fashion, and a sterile drape was applied covering the operative field. A sterile gown and sterile gloves were used for the procedure. Local anesthesia was provided with 1% Lidocaine. An 18 gauge trocar needle was advanced under CT guidance into a right pericholecystic/perihepatic fluid collection. After confirming needle tip position, aspiration of fluid was performed and a sample sent for culture analysis. A guidewire was advanced and the needle removed. The percutaneous tract was dilated to 10 [place] and a 10 [place] percutaneous drain placed. Drainage catheter positioning was confirmed by CT. The catheter was connected to a suction bulb. The catheter was secured at the skin with a Prolene  retention suture and adhesive StatLock device. COMPLICATIONS: None FINDINGS: Aspiration at the level of the pericholecystic and perihepatic fluid collection yielded dark, bilious fluid. The drain was formed in the collection and is draining well after placement. IMPRESSION: CT-guided percutaneous catheter drainage of pericholecystic/perihepatic fluid collection yielding dark, bilious fluid. A sample was sent for culture analysis. A 10 French drain was placed and attached to suction bulb drainage. Electronically Signed   By: Irish Lack M.D.   On: 09/30/2018 14:56     Microbiology: Recent Results (from the past 240 hour(s))  Aerobic/Anaerobic Culture (surgical/deep wound)     Status: None (Preliminary result)   Collection Time: 09/30/18  1:37 PM  Result Value Ref Range Status   Specimen Description ABSCESS  Final   Special Requests NONE  Final   Gram Stain   Final    FEW WBC PRESENT, PREDOMINANTLY PMN NO ORGANISMS SEEN    Culture   Final    NO GROWTH 2 DAYS NO ANAEROBES ISOLATED; CULTURE IN PROGRESS FOR 5 DAYS Performed at [facility], [address], [place], [place] [address]    Report Status PENDING  Incomplete     Labs: Basic Metabolic Panel: Recent Labs  Lab 09/28/18 0625 09/29/18 0442 09/30/18 0437 10/01/18 0436 10/02/18 0451  NA 137 140 138 139 138  K 3.8 3.4* 3.7 3.6 3.5  CL 106 109 109 109 110  CO2 21* GLUCOSE 193* 61* 136* 135* 88  BUN 60* 43* 28* 25* 22  CREATININE 2.15* 1.76* 1.73* 1.69* 1.60*  CALCIUM 8.3* 8.2* 7.9* 7.9* 7.8*   Liver Function Tests: Recent Labs  Lab 09/28/18 0625 09/29/18 0442 09/30/18 0437 10/01/18 0436 10/02/18 0451  AST 56* 48* 37 33 28  ALT 36 ALKPHOS 66 66 68 81 87  BILITOT 12.0* 7.5* 4.9* 4.2* 3.8*  PROT 6.1* 5.4* 5.3* 5.3* 5.2*  ALBUMIN 2.6* 2.2* 2.1* 2.0* 2.0*   Recent Labs  Lab 09/26/18 0538  LIPASE 30   Recent Labs  Lab 09/28/18 0624  AMMONIA 37*   CBC: Recent Labs  Lab  09/26/18  9629 09/27/18 0630 09/28/18 0625 09/29/18 0442 09/30/18 0437 10/01/18 0436  WBC 8.8 11.3* 6.4 7.3 6.6 5.9  NEUTROABS 6.7 8.9* 4.9 4.6 3.8  --   HGB 11.8* 11.2* 10.0* 9.8* 9.4* 9.8*  HCT 36.6* 34.4* 31.1* 30.3* 29.1* 30.8*  MCV 95.8 95.8 94.0 93.8 95.7 97.2  PLT 97* 108* 109* 121* 119* 113*   Cardiac Enzymes: No results for input(s): CKTOTAL, CKMB, CKMBINDEX, TROPONINI in the last 168 hours. BNP: Invalid input(s): POCBNP CBG: Recent Labs  Lab 10/01/18 1121 10/01/18 1655 10/01/18 2308 10/02/18 0815 10/02/18 1131  GLUCAP 160* 168* 144* 63* 73    Time coordinating discharge:  36 minutes  Signed:  Catarina Hartshorn, DO [facility] Pager: [phone] 10/02/2018, 1:12 PM

## 2018-10-02 NOTE — Progress Notes (Signed)
Patient has no complaints. Biliary drain was 100 mL in the last 24 hours. Bilirubin is down to 3.8. Agree with discharge planning. Agree with Dr. Lovell Sheehan recommendations about antibiotic use for another week.

## 2018-10-02 NOTE — Care Management (Signed)
Discussed home health again with patient and sister at bedside. Both still decline home health RN. Sister has been working with RN on emptying and flushing drain.   Asked attending to order saline flushes for patient at time of DC.

## 2018-10-02 NOTE — Progress Notes (Signed)
Attending RN used the demonstration method in showing the patient and patient's sister on how to empty and flush the drain. 30 cc was emptied out of the drain. Before discharge, attending RN will use the teach back method with the patient and his sister on emptying and flushing drain. Patient and sister seemed to have a good understanding when the nurse demonstrated, will assess their skills before patient is discharged.

## 2018-10-02 NOTE — Progress Notes (Signed)
IV's removed. WNL. D/C instructions given to pt. Verbalized understanding. Watched the patient and sister demonstrate JP drain care.

## 2018-10-02 NOTE — Progress Notes (Signed)
Subjective: Patient has no complaints.  Objective: Vital signs in last 24 hours: Temp:  [98.8 F (37.1 C)-99.1 F (37.3 C)] 99.1 F (37.3 C) (02/27 0545) Pulse Rate:  [63-65] 63 (02/27 0545) Resp:  [19-20] 20 (02/27 0545) BP: (82-131)/(29-61) 117/50 (02/27 0545) SpO2:  [89 %-98 %] 95 % (02/27 0545) Last BM Date: 09/30/18  Intake/Output from previous day: 02/26 0701 - 02/27 0700 In: 240 [P.O.:240] Out: 100 [Drains:100] Intake/Output this shift: No intake/output data recorded.  General appearance: alert, cooperative and no distress GI: soft, non-tender; bowel sounds normal; no masses,  no organomegaly and Serosanguineous drainage in JP drain.  Mild bilious fluid present.  Lab Results:  Recent Labs    09/30/18 0437 10/01/18 0436  WBC 6.6 5.9  HGB 9.4* 9.8*  HCT 29.1* 30.8*  PLT 119* 113*   BMET Recent Labs    10/01/18 0436 10/02/18 0451  NA 139 138  K 3.6 3.5  CL 109 110  CO2 24 22  GLUCOSE 135* 88  BUN 25* 22  CREATININE 1.69* 1.60*  CALCIUM 7.9* 7.8*   PT/INR No results for input(s): LABPROT, INR in the last 72 hours.  Studies/Results: Ct Image Guided Drainage By Percutaneous Catheter  Result Date: 09/30/2018 CLINICAL DATA:  Evidence of gallbladder perforation by imaging with pericholecystic and perihepatic fluid collection containing at least 2 extraluminal gallstones. The patient is not a current candidate for cholecystectomy and requires percutaneous drainage catheter placement. EXAM: CT GUIDED CATHETER DRAINAGE OF RIGHT UPPER QUADRANT PERITONEAL ABSCESS ANESTHESIA/SEDATION: 1.5 mg IV Versed 75 mcg IV Fentanyl Total Moderate Sedation Time:  20 minutes The patient's level of consciousness and physiologic status were continuously monitored during the procedure by Radiology nursing. PROCEDURE: The procedure, risks, benefits, and alternatives were explained to the patient. Questions regarding the procedure were encouraged and answered. The patient understands  and consents to the procedure. A time out was performed prior to initiating the procedure. Initial CT was performed in a supine position through the upper abdomen. The right abdominal wall was prepped with chlorhexidine in a sterile fashion, and a sterile drape was applied covering the operative field. A sterile gown and sterile gloves were used for the procedure. Local anesthesia was provided with 1% Lidocaine. An 18 gauge trocar needle was advanced under CT guidance into a right pericholecystic/perihepatic fluid collection. After confirming needle tip position, aspiration of fluid was performed and a sample sent for culture analysis. A guidewire was advanced and the needle removed. The percutaneous tract was dilated to 10 [place] and a 10 [place] percutaneous drain placed. Drainage catheter positioning was confirmed by CT. The catheter was connected to a suction bulb. The catheter was secured at the skin with a Prolene retention suture and adhesive StatLock device. COMPLICATIONS: None FINDINGS: Aspiration at the level of the pericholecystic and perihepatic fluid collection yielded dark, bilious fluid. The drain was formed in the collection and is draining well after placement. IMPRESSION: CT-guided percutaneous catheter drainage of pericholecystic/perihepatic fluid collection yielding dark, bilious fluid. A sample was sent for culture analysis. A 10 French drain was placed and attached to suction bulb drainage. Electronically Signed   By: Irish Lack M.D.   On: 09/30/2018 14:56    Anti-infectives: Anti-infectives (From admission, onward)   Start     Dose/Rate Route Frequency Ordered Stop   09/28/18 1000  piperacillin-tazobactam (ZOSYN) IVPB 3.375 g     3.375 g 12.5 mL/hr over 240 Minutes Intravenous Every 8 hours 09/28/18 0836     09/28/18 0500  metroNIDAZOLE (FLAGYL) IVPB 500 mg  Status:  Discontinued     500 mg 100 mL/hr over 60 Minutes Intravenous Every 8 hours 09/28/18 0447 09/28/18 0454    09/25/18 1000  piperacillin-tazobactam (ZOSYN) IVPB 3.375 g  Status:  Discontinued     3.375 g 12.5 mL/hr over 240 Minutes Intravenous Every 8 hours 09/25/18 0249 09/28/18 0447   09/25/18 0600  piperacillin-tazobactam (ZOSYN) IVPB 3.375 g  Status:  Discontinued     3.375 g 100 mL/hr over 30 Minutes Intravenous Every 8 hours 09/25/18 0245 09/25/18 0250   09/25/18 0215  piperacillin-tazobactam (ZOSYN) IVPB 3.375 g     3.375 g 100 mL/hr over 30 Minutes Intravenous  Once 09/25/18 0206 09/25/18 0246      Assessment/Plan: Imp: Perforated cholecystitis, status post pigtail drainage. Plan: Initial Gram stain of fluid is negative for organisms.  Would place on oral antibiotics for 1 week.  Augmentin may be a good choice.  We will follow-up with IR for CT scan and catheter maintenance.  I will see the patient in 1 month in my office for follow-up.  LOS: 7 days    Franky Macho 10/02/2018

## 2018-10-05 LAB — AEROBIC/ANAEROBIC CULTURE W GRAM STAIN (SURGICAL/DEEP WOUND): Culture: NO GROWTH

## 2018-10-06 ENCOUNTER — Other Ambulatory Visit: Payer: Self-pay | Admitting: General Surgery

## 2018-10-08 ENCOUNTER — Encounter: Payer: Self-pay | Admitting: Radiology

## 2018-10-08 ENCOUNTER — Ambulatory Visit
Admission: RE | Admit: 2018-10-08 | Discharge: 2018-10-08 | Disposition: A | Discharge status: Home / Self Care | Payer: Medicaid Other | Source: Ambulatory Visit | Attending: General Surgery | Admitting: General Surgery

## 2018-10-08 ENCOUNTER — Encounter: Payer: Self-pay | Admitting: *Deleted

## 2018-10-08 ENCOUNTER — Ambulatory Visit
Admission: RE | Admit: 2018-10-08 | Discharge: 2018-10-08 | Disposition: A | Discharge status: Home / Self Care | Payer: Medicaid Other | Source: Ambulatory Visit | Attending: Radiology | Admitting: Radiology

## 2018-10-08 ENCOUNTER — Telehealth (INDEPENDENT_AMBULATORY_CARE_PROVIDER_SITE_OTHER): Payer: Self-pay | Admitting: Internal Medicine

## 2018-10-08 DIAGNOSIS — K8000 Calculus of gallbladder with acute cholecystitis without obstruction: Secondary | ICD-10-CM

## 2018-10-08 HISTORY — PX: IR RADIOLOGIST EVAL & MGMT: IMG5224

## 2018-10-08 MED ORDER — IOPAMIDOL (ISOVUE-300) INJECTION 61%
125.0000 mL | Freq: Once | INTRAVENOUS | Status: AC | PRN
  Administered 2018-10-08: 125 mL via INTRAVENOUS

## 2018-10-08 NOTE — Progress Notes (Signed)
Referring Physician(s): Dr Zenon Mayo  Chief Complaint: The patient is seen in follow up today s/p 09/30/18:  CT-guided percutaneous catheter drainage of pericholecystic/perihepatic fluid collection yielding dark, bilious fluid  History of present illness:  Evidence of gallbladder perforation by imaging with pericholecystic and perihepatic fluid collection containing at least 2 extraluminal gallstones. The patient is not a current candidate for cholecystectomy.  Pt has done well this last week Denies pain Denies N/V Denies fever chills OP is thin bilious fluid Flushing daily  Scheduled today for CT and possible injection   Past Medical History:  Diagnosis Date  . Anemia   . Cryptogenic cirrhosis (HCC)   . Diabetes mellitus   . GERD (gastroesophageal reflux disease)   . Hypertension   . Liver disease     Past Surgical History:  Procedure Laterality Date  . COLONOSCOPY    . INCISION AND DRAINAGE OF WOUND Right 12/28/2016   Procedure: IRRIGATION AND DEBRIDEMENT WOUND;  Surgeon: Betha Loa, MD;  Location: [facility];  Service: Orthopedics;  Laterality: Right;  . LIVER BIOPSY    . NERVE, TENDON AND ARTERY REPAIR Right 12/28/2016   Procedure: NERVE, TENDON AND ARTERY REPAIR;  Surgeon: Betha Loa, MD;  Location: [facility];  Service: Orthopedics;  Laterality: Right;  . PERCUTANEOUS PINNING Right 12/28/2016   Procedure: PERCUTANEOUS PINNING EXTREMITY;  Surgeon: Betha Loa, MD;  Location: [facility];  Service: Orthopedics;  Laterality: Right;  . TIPS PROCEDURE  2008  . UPPER GASTROINTESTINAL ENDOSCOPY      Allergies: Patient has no known allergies.  Medications: Prior to Admission medications   Medication Sig Start Date End Date Taking? Authorizing Provider  amoxicillin-clavulanate (AUGMENTIN) 875-125 MG tablet Take 1 tablet by mouth every 12 (twelve) hours. 10/02/18   Catarina Hartshorn, MD  carvedilol (COREG) 6.25 MG tablet Take  6.25 mg by mouth 2 (two) times daily with a meal.    [provider]  Cyanocobalamin (VITAMIN B-12 IJ) Inject as directed once a week. Currently on starter - for 6 weeks then monthly.Currently in 4 th week    [provider]  glimepiride (AMARYL) 4 MG tablet Take 4 mg by mouth daily before breakfast.    [provider]  omeprazole (PRILOSEC) 20 MG capsule TAKE 1 CAPSULE BY MOUTH EVERY MORNING FOR ACID REFLUX. 07/05/14   Rehman, Joline Maxcy, MD     Family History  Problem Relation Age of Onset  . Healthy Mother   . Diabetes Father   . Heart disease Father   . Healthy Sister   . Healthy Sister     Social History   Socioeconomic History  . Marital status: Single    Spouse name: Not on file  . Number of children: Not on file  . Years of education: Not on file  . Highest education level: Not on file  Occupational History  . Not on file  Social Needs  . Financial resource strain: Not on file  . Food insecurity:    Worry: Not on file    Inability: Not on file  . Transportation needs:    Medical: Not on file    Non-medical: Not on file  Tobacco Use  . Smoking status: Current Every Day Smoker    Packs/day: 1.00    Years: 40.00    Pack years: 40.00    Types: Cigarettes  . Smokeless tobacco: Never Used  . Tobacco comment: patient states that in the 40 years he has stopped  smoking then start again  Substance and Sexual Activity  . Alcohol use: No    Alcohol/week: 0.0 standard drinks  . Drug use: No  . Sexual activity: Not on file  Lifestyle  . Physical activity:    Days per week: Not on file    Minutes per session: Not on file  . Stress: Not on file  Relationships  . Social connections:    Talks on phone: Not on file    Gets together: Not on file    Attends religious service: Not on file    Active member of club or organization: Not on file    Attends meetings of clubs or organizations: Not on file    Relationship status: Not on file  Other  Topics Concern  . Not on file  Social History Narrative  . Not on file     Vital Signs: There were no vitals taken for this visit.  Physical Exam Skin:    General: Skin is warm and dry.     Comments: Site clean and dry NT no bleeding OP bilious Cxs no growth   Neurological:     Mental Status: He is alert.    CT today showing good position of drain No collections noted per Dr Deanne Coffer  Imaging: No results found.  Labs:  CBC: Recent Labs    09/28/18 0625 09/29/18 0442 09/30/18 0437 10/01/18 0436  WBC 6.4 7.3 6.6 5.9  HGB 10.0* 9.8* 9.4* 9.8*  HCT 31.1* 30.3* 29.1* 30.8*  PLT 109* 121* 119* 113*    COAGS: Recent Labs    09/25/18 0954 09/27/18 0630 09/28/18 0625  INR 1.06 1.05 1.09    BMP: Recent Labs    09/29/18 0442 09/30/18 0437 10/01/18 0436 10/02/18 0451  NA 140 138 139 138  K 3.4* 3.7 3.6 3.5  CL 109 109 109 110  CO2 25 24 24 22   GLUCOSE 61* 136* 135* 88  BUN 43* 28* 25* 22  CALCIUM 8.2* 7.9* 7.9* 7.8*  CREATININE 1.76* 1.73* 1.69* 1.60*  GFRNONAA 41* 41* 43* 45*  GFRAA 47* 48* 49* 53*    LIVER FUNCTION TESTS: Recent Labs    09/29/18 0442 09/30/18 0437 10/01/18 0436 10/02/18 0451  BILITOT 7.5* 4.9* 4.2* 3.8*  AST 48* 37 33 28  ALT 29 26 25 23   ALKPHOS 66 68 81 87  PROT 5.4* 5.3* 5.3* 5.2*  ALBUMIN 2.2* 2.1* 2.0* 2.0*    Assessment:  Pericholecystic/Perihepatic Abscess drain placed 09/30/18 Doing well Rx for saline flushes given to pt-- can flush every to every other day Plan for Dr Franky Macho to follow up with pt Will need cholecystectomy Pt is to make appt with Dr Lovell Sheehan He and sister have good understandig of plan   Signed: Robet Leu, PA-C 10/08/2018, 11:17 AM   Please refer to Dr. Epimenio Foot attestation of this note for management and plan.

## 2018-10-08 NOTE — Telephone Encounter (Signed)
Dr.Rehman has been paged. 

## 2018-10-08 NOTE — Telephone Encounter (Signed)
Patients wife left message stating when patient was in the hospital Dr Karilyn Cota took patient off all Lasix - patient is having a lot of swelling - please call [phone]

## 2018-10-08 NOTE — Telephone Encounter (Signed)
Message sent to Dr.Rehman and forwarded to him telephone encounter for documentation.

## 2018-10-10 ENCOUNTER — Encounter (INDEPENDENT_AMBULATORY_CARE_PROVIDER_SITE_OTHER): Payer: Self-pay | Admitting: Internal Medicine

## 2018-10-10 ENCOUNTER — Telehealth (INDEPENDENT_AMBULATORY_CARE_PROVIDER_SITE_OTHER): Payer: Self-pay | Admitting: *Deleted

## 2018-10-10 NOTE — Telephone Encounter (Signed)
Patient had questions about the taking of his Lasix. Apparently it was stopped at t he time of discharge from hospital. He took one yesterday morning and 1 this morning. He was originally taking one by mouth every other day , his sister is asking how he should take the medication?   Per Dr. Karilyn Cota he should take 1 by mouth every other day.  He will nee appointment with Dr.Rehman in the next 2-3 weeks.

## 2018-10-12 NOTE — Telephone Encounter (Signed)
Patient has been advised to go back on his usual diuretic medications

## 2018-10-13 DIAGNOSIS — E538 Deficiency of other specified B group vitamins: Secondary | ICD-10-CM | POA: Diagnosis not present

## 2018-10-14 ENCOUNTER — Encounter: Payer: Self-pay | Admitting: General Surgery

## 2018-10-14 ENCOUNTER — Ambulatory Visit (INDEPENDENT_AMBULATORY_CARE_PROVIDER_SITE_OTHER): Payer: Medicaid Other | Admitting: General Surgery

## 2018-10-14 VITALS — BP 146/62 | HR 62 | Temp 97.8°F | Resp 22 | Wt 248.4 lb

## 2018-10-14 DIAGNOSIS — K7030 Alcoholic cirrhosis of liver without ascites: Secondary | ICD-10-CM

## 2018-10-14 DIAGNOSIS — K8001 Calculus of gallbladder with acute cholecystitis with obstruction: Secondary | ICD-10-CM

## 2018-10-15 ENCOUNTER — Other Ambulatory Visit: Payer: Self-pay | Admitting: General Surgery

## 2018-10-15 DIAGNOSIS — K769 Liver disease, unspecified: Secondary | ICD-10-CM

## 2018-10-15 DIAGNOSIS — K760 Fatty (change of) liver, not elsewhere classified: Secondary | ICD-10-CM

## 2018-10-15 NOTE — Progress Notes (Signed)
Subjective:     [name] [name]  Here for follow-up hospitalization.  Patient was hospitalized with a perforated gallbladder which was managed conservatively.  He has underlining cirrhosis of the liver.  He states he has been feeling great.  He denies any fever, chills, or jaundice.  His appetite is good.  A percutaneous drainage tube is in place and is draining approximately 40 to 70 cc of bilious fluid daily.  He has finished his antibiotic course. Objective:    BP (!) 146/62 (BP Location: Left Arm, Patient Position: Sitting, Cuff Size: Large)   Pulse 62   Temp 97.8 F (36.6 C) (Temporal)   Resp (!) 22   Wt 248 lb 6.4 oz (112.7 kg)   BMI 34.64 kg/m   General:  alert, cooperative and no distress  Abdomen is soft, nontender, nondistended.  No significant ascites appreciable.  Percutaneous drain in place with dilute bilious fluid present.  No blood is present.  Recent CT scan of the abdomen report reviewed     Assessment:    Doing well, status post percutaneous drainage of perforated cholecystitis.  There is a question of a mass within the parenchyma of the liver, possibly consistent with hepatocellular carcinoma.    Plan:   Plan: Continue drainage.  Will get MRI of liver to further assess the mass.  Further management is pending those results.

## 2018-10-22 ENCOUNTER — Other Ambulatory Visit: Payer: Self-pay

## 2018-10-22 ENCOUNTER — Ambulatory Visit (HOSPITAL_COMMUNITY)
Admission: RE | Admit: 2018-10-22 | Discharge: 2018-10-22 | Disposition: A | Discharge status: Home / Self Care | Payer: Medicaid Other | Source: Ambulatory Visit | Attending: General Surgery | Admitting: General Surgery

## 2018-10-22 DIAGNOSIS — K769 Liver disease, unspecified: Secondary | ICD-10-CM | POA: Insufficient documentation

## 2018-10-22 DIAGNOSIS — K760 Fatty (change of) liver, not elsewhere classified: Secondary | ICD-10-CM | POA: Insufficient documentation

## 2018-10-22 DIAGNOSIS — R188 Other ascites: Secondary | ICD-10-CM | POA: Diagnosis not present

## 2018-10-22 DIAGNOSIS — R161 Splenomegaly, not elsewhere classified: Secondary | ICD-10-CM | POA: Diagnosis not present

## 2018-10-22 MED ORDER — GADOBUTROL 1 MMOL/ML IV SOLN
10.0000 mL | Freq: Once | INTRAVENOUS | Status: AC | PRN
  Administered 2018-10-22: 10 mL via INTRAVENOUS

## 2018-10-27 ENCOUNTER — Telehealth: Payer: Self-pay | Admitting: Emergency Medicine

## 2018-10-27 NOTE — Telephone Encounter (Signed)
Called patient and cancelled appointment. Notified him tht due to the corona virus per [place] we are not able to see any patients in the office and if he has any emergencies to please  Go to the ER. Patient stated he is not having any issues at this time

## 2018-10-28 ENCOUNTER — Other Ambulatory Visit: Payer: Self-pay

## 2018-10-28 ENCOUNTER — Encounter: Payer: Self-pay | Admitting: General Surgery

## 2018-10-28 ENCOUNTER — Ambulatory Visit (INDEPENDENT_AMBULATORY_CARE_PROVIDER_SITE_OTHER): Payer: Medicaid Other | Admitting: General Surgery

## 2018-10-28 ENCOUNTER — Encounter (INDEPENDENT_AMBULATORY_CARE_PROVIDER_SITE_OTHER): Payer: Self-pay | Admitting: Internal Medicine

## 2018-10-28 ENCOUNTER — Ambulatory Visit: Payer: Medicaid Other | Admitting: General Surgery

## 2018-10-28 ENCOUNTER — Ambulatory Visit (INDEPENDENT_AMBULATORY_CARE_PROVIDER_SITE_OTHER): Payer: Medicaid Other | Admitting: Internal Medicine

## 2018-10-28 VITALS — BP 184/66 | HR 65 | Temp 98.0°F | Resp 18 | Wt 241.0 lb

## 2018-10-28 VITALS — BP 153/70 | HR 62 | Temp 98.3°F | Resp 18 | Ht 70.5 in | Wt 238.6 lb

## 2018-10-28 DIAGNOSIS — K7469 Other cirrhosis of liver: Secondary | ICD-10-CM | POA: Diagnosis not present

## 2018-10-28 DIAGNOSIS — K8001 Calculus of gallbladder with acute cholecystitis with obstruction: Secondary | ICD-10-CM | POA: Diagnosis not present

## 2018-10-28 DIAGNOSIS — Z8719 Personal history of other diseases of the digestive system: Secondary | ICD-10-CM | POA: Diagnosis not present

## 2018-10-28 DIAGNOSIS — D649 Anemia, unspecified: Secondary | ICD-10-CM

## 2018-10-28 NOTE — Progress Notes (Signed)
Presenting complaint;  Follow-up to recent hospitalization for perforated cholecystitis. Patient has history of cryptogenic cirrhosis.  Database and subjective:  [name] [name] is 63 year old Caucasian male who has history of cryptogenic cirrhosis diagnosed in 2006 when he presented with refractory ascites and eventually went on to have TIPS and has done well. He was hospitalized last week for jaundice and right upper quadrant abdominal pain and found to have perforated acute cholecystitis and subhepatic abscess.  His jaundice was felt to be due to intrahepatic cholestasis in the setting of infection and not due to obstructive process.  He was felt to be too sick to undergo cholecystectomy.  He was treated with cholecystostomy and symptomatic improvement. He underwent abdominal CT on 10/08/2018 which revealed 54m enhancing lesion in segment 3 concerning for hepatocellular carcinoma.  TIPS was felt to be a patent.  Gallbladder was decompressed containing calcified stone and there was only small amount of residual fluid collection. 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  Study also revealed cirrhosis splenomegaly moderate right pleural effusion and small volume ascites. In the meantime patient has not had any drainage for the last 2 weeks.  He also has been pain-free.  Therefore Dr. JArnoldo Moraleremoved biliary drain earlier today.  Patient reports feeling much better.  He has good appetite but his taste is still not normal.  He says he used to drink 2 [facility] day and now he does not have taste for it.  His bowels move daily.  He denies shortness of breath or abdominal pain.  He has had diarrhea off and on but not in the last week or so.   Current Medications: Outpatient Encounter Medications as of 10/28/2018  Medication Sig  . carvedilol (COREG) 6.25 MG tablet Take 6.25 mg by mouth 2 (two) times daily with a meal.  . Cyanocobalamin  (VITAMIN B-12 IJ) Inject as directed once a week. Currently on starter - for 6 weeks then monthly.Currently in 4 th week  . furosemide (LASIX) 40 MG tablet Take 40 mg by mouth. Patient is taking 1 by mouth every other day.  .Marland Kitchenglimepiride (AMARYL) 4 MG tablet Take 4 mg by mouth daily before breakfast.  . LOSARTAN POTASSIUM PO Take 100 mg by mouth.  .Marland Kitchenomeprazole (PRILOSEC) 20 MG capsule TAKE 1 CAPSULE BY MOUTH EVERY MORNING FOR ACID REFLUX.   No facility-administered encounter medications on file as of 10/28/2018.      Objective: Blood pressure (!) 153/70, pulse 62, temperature 98.3 F (36.8 C), temperature source Oral, resp. rate 18, height 5' 10.5" (1.791 m), weight 238 lb 9.6 oz (108.2 kg). Patient is alert and in no acute distress. He does not have asterixis. Conjunctiva is pink. Sclera is nonicteric Oropharyngeal mucosa is normal. No neck masses or thyromegaly noted. Cardiac exam with regular rhythm normal S1 and S2. No murmur or gallop noted. Lungs are clear to auscultation. Abdomen is full.  He has sterile dressing over the site of percutaneous drain.  Abdomen is soft and nontender without palpable spleen.  Liver edge is palpable below the right costal margin. No LE edema or clubbing noted.  Labs/studies Results:  CBC Latest Ref Rng & Units 10/01/2018 09/30/2018 09/29/2018  WBC 4.0 - 10.5 K/uL 5.9 6.6 7.3  Hemoglobin 13.0 - 17.0 g/dL 9.8(L) 9.4(L) 9.8(L)  Hematocrit 39.0 - 52.0 % 30.8(L) 29.1(L) 30.3(L)  Platelets 150 - 400 K/uL 113(L) 119(L) 121(L)    CMP Latest  Ref Rng & Units 10/02/2018 10/01/2018 09/30/2018  Glucose 70 - 99 mg/dL 88 135(H) 136(H)  BUN 8 - 23 mg/dL 22 25(H) 28(H)  Creatinine 0.61 - 1.24 mg/dL 1.60(H) 1.69(H) 1.73(H)  Sodium 135 - 145 mmol/L 138 139 138  Potassium 3.5 - 5.1 mmol/L 3.5 3.6 3.7  Chloride 98 - 111 mmol/L 110 109 109  CO2 22 - 32 mmol/L '22 24 24  '$ Calcium 8.9 - 10.3 mg/dL 7.8(L) 7.9(L) 7.9(L)  Total Protein 6.5 - 8.1 g/dL 5.2(L) 5.3(L) 5.3(L)   Total Bilirubin 0.3 - 1.2 mg/dL 3.8(H) 4.2(H) 4.9(H)  Alkaline Phos 38 - 126 U/L 87 81 68  AST 15 - 41 U/L 28 33 37  ALT 0 - 44 U/L '23 25 26    '$ Hepatic Function Latest Ref Rng & Units 10/02/2018 10/01/2018 09/30/2018  Total Protein 6.5 - 8.1 g/dL 5.2(L) 5.3(L) 5.3(L)  Albumin 3.5 - 5.0 g/dL 2.0(L) 2.0(L) 2.1(L)  AST 15 - 41 U/L 28 33 37  ALT 0 - 44 U/L '23 25 26  '$ Alk Phosphatase 38 - 126 U/L 87 81 68  Total Bilirubin 0.3 - 1.2 mg/dL 3.8(H) 4.2(H) 4.9(H)  Bilirubin, Direct 0.0 - 0.2 mg/dL - - -    MRI reviewed.  Findings as above.  Assessment:  #1.  History of perforated cholecystitis responding to IV antibiotics and percutaneous drainage of abscess.  Drain was removed by Dr. Arnoldo Morale earlier today.  Patient is now asymptomatic.  It remains to be seen if he would require cholecystectomy down the road.  #2.  Cholestasis with acute illness.  Clinically he is not jaundice.  Will check labs.  #3.  Cryptogenic cirrhosis.  There was some hepatic decompensation while he was in the hospital with a drop in his albumin to 2.  Hopefully is not coming back high.  #4.  Anemia secondary to acute illness.   Plan:  Patient advised to call office if he has abdominal pain or fever. He will go to the lab for blood work to consist of CBC, comprehensive chemistry panel and an INR this or next week. Office visit in 3 months.

## 2018-10-28 NOTE — Progress Notes (Signed)
Subjective:     [name] [name]  Patient here for post hospitalization visit.  He is doing well.  Has no nausea or vomiting.  He denies any fever or chills.  He denies any jaundice.  He has not had any drainage from his pigtail catheter drain in over a week. Objective:    BP (!) 184/66 (BP Location: Left Arm, Patient Position: Sitting, Cuff Size: Normal)   Pulse 65   Temp 98 F (36.7 C) (Temporal)   Resp 18   Wt 241 lb (109.3 kg)   BMI 33.61 kg/m   General:  alert, cooperative and no distress  Abdomen soft.  Pigtail catheter removed.     Assessment:    Patient doing well after rupture of his gallbladder.    Plan:   I discussed management with Dr. Karilyn Cota.  He will see the patient in the few weeks to get lab test.  No need for acute surgical invention at this time.  Patient was instructed to go to the emergency room should he develop fever, chills, or jaundice.  Further follow-up will be done expectantly with Dr. Karilyn Cota.

## 2018-10-28 NOTE — Patient Instructions (Signed)
Physician will call with results of blood test when completed. Please notify if you have right upper quadrant abdominal pain or fever.

## 2018-11-03 ENCOUNTER — Other Ambulatory Visit: Payer: Self-pay | Admitting: "Endocrinology

## 2018-11-03 DIAGNOSIS — K7469 Other cirrhosis of liver: Secondary | ICD-10-CM | POA: Diagnosis not present

## 2018-11-03 DIAGNOSIS — D649 Anemia, unspecified: Secondary | ICD-10-CM | POA: Diagnosis not present

## 2018-11-04 LAB — CBC/DIFF AMBIGUOUS DEFAULT
Basophils Absolute: 0 10*3/uL (ref 0.0–0.2)
Basos: 1 %
EOS (ABSOLUTE): 0.2 10*3/uL (ref 0.0–0.4)
Eos: 4 %
Hematocrit: 33.2 % — ABNORMAL LOW (ref 37.5–51.0)
Hemoglobin: 11 g/dL — ABNORMAL LOW (ref 13.0–17.7)
Immature Grans (Abs): 0 10*3/uL (ref 0.0–0.1)
Immature Granulocytes: 0 %
Lymphocytes Absolute: 1.4 10*3/uL (ref 0.7–3.1)
Lymphs: 27 %
MCH: 30.1 pg (ref 26.6–33.0)
MCHC: 33.1 g/dL (ref 31.5–35.7)
MCV: 91 fL (ref 79–97)
Monocytes Absolute: 0.6 10*3/uL (ref 0.1–0.9)
Monocytes: 12 %
Neutrophils Absolute: 2.9 10*3/uL (ref 1.4–7.0)
Neutrophils: 56 %
Platelets: 139 10*3/uL — ABNORMAL LOW (ref 150–450)
RBC: 3.65 x10E6/uL — ABNORMAL LOW (ref 4.14–5.80)
RDW: 14.9 % (ref 11.6–15.4)
WBC: 5.1 10*3/uL (ref 3.4–10.8)

## 2018-11-04 LAB — PROTIME-INR
INR: 1 (ref 0.8–1.2)
Prothrombin Time: 10.9 s (ref 9.1–12.0)

## 2018-11-04 LAB — SPECIMEN STATUS REPORT

## 2018-11-04 LAB — COMPREHENSIVE METABOLIC PANEL
ALT: 22 IU/L (ref 0–44)
AST: 34 IU/L (ref 0–40)
Albumin/Globulin Ratio: 0.9 — ABNORMAL LOW (ref 1.2–2.2)
Albumin: 3.4 g/dL — ABNORMAL LOW (ref 3.8–4.8)
Alkaline Phosphatase: 167 IU/L — ABNORMAL HIGH (ref 39–117)
BUN/Creatinine Ratio: 11 (ref 10–24)
BUN: 23 mg/dL (ref 8–27)
Bilirubin Total: 1.8 mg/dL — ABNORMAL HIGH (ref 0.0–1.2)
CHLORIDE: 106 mmol/L (ref 96–106)
CO2: 21 mmol/L (ref 20–29)
Calcium: 9.2 mg/dL (ref 8.6–10.2)
Creatinine, Ser: 2.02 mg/dL — ABNORMAL HIGH (ref 0.76–1.27)
GFR calc Af Amer: 40 mL/min/{1.73_m2} — ABNORMAL LOW (ref >59)
GFR calc non Af Amer: 34 mL/min/{1.73_m2} — ABNORMAL LOW (ref >59)
GLOBULIN, TOTAL: 3.7 g/dL (ref 1.5–4.5)
Glucose: 173 mg/dL — ABNORMAL HIGH (ref 65–99)
Potassium: 4.7 mmol/L (ref 3.5–5.2)
Sodium: 140 mmol/L (ref 134–144)
Total Protein: 7.1 g/dL (ref 6.0–8.5)

## 2018-11-11 ENCOUNTER — Other Ambulatory Visit (INDEPENDENT_AMBULATORY_CARE_PROVIDER_SITE_OTHER): Payer: Self-pay | Admitting: *Deleted

## 2018-11-11 ENCOUNTER — Encounter (INDEPENDENT_AMBULATORY_CARE_PROVIDER_SITE_OTHER): Payer: Self-pay | Admitting: *Deleted

## 2018-11-11 DIAGNOSIS — K7469 Other cirrhosis of liver: Secondary | ICD-10-CM

## 2018-11-18 DIAGNOSIS — E538 Deficiency of other specified B group vitamins: Secondary | ICD-10-CM | POA: Diagnosis not present

## 2018-11-26 DIAGNOSIS — K7469 Other cirrhosis of liver: Secondary | ICD-10-CM | POA: Diagnosis not present

## 2018-11-26 DIAGNOSIS — D649 Anemia, unspecified: Secondary | ICD-10-CM | POA: Diagnosis not present

## 2018-11-27 LAB — CBC
HCT: 36.5 % — ABNORMAL LOW (ref 38.5–50.0)
Hemoglobin: 12.6 g/dL — ABNORMAL LOW (ref 13.2–17.1)
MCH: 31 pg (ref 27.0–33.0)
MCHC: 34.5 g/dL (ref 32.0–36.0)
MCV: 89.7 fL (ref 80.0–100.0)
MPV: 10.2 fL (ref 7.5–12.5)
PLATELETS: 118 10*3/uL — AB (ref 140–400)
RBC: 4.07 10*6/uL — ABNORMAL LOW (ref 4.20–5.80)
RDW: 14.1 % (ref 11.0–15.0)
WBC: 5 10*3/uL (ref 3.8–10.8)

## 2018-11-27 LAB — COMPREHENSIVE METABOLIC PANEL
AG Ratio: 0.9 (calc) — ABNORMAL LOW (ref 1.0–2.5)
ALT: 23 U/L (ref 9–46)
AST: 31 U/L (ref 10–35)
Albumin: 3.1 g/dL — ABNORMAL LOW (ref 3.6–5.1)
Alkaline phosphatase (APISO): 175 U/L — ABNORMAL HIGH (ref 35–144)
BUN: 24 mg/dL (ref 7–25)
CO2: 23 mmol/L (ref 20–32)
Calcium: 8.7 mg/dL (ref 8.6–10.3)
Chloride: 111 mmol/L — ABNORMAL HIGH (ref 98–110)
Creat: 1.2 mg/dL (ref 0.70–1.25)
Globulin: 3.6 g/dL (calc) (ref 1.9–3.7)
Glucose, Bld: 189 mg/dL — ABNORMAL HIGH (ref 65–139)
Potassium: 4 mmol/L (ref 3.5–5.3)
SODIUM: 141 mmol/L (ref 135–146)
TOTAL PROTEIN: 6.7 g/dL (ref 6.1–8.1)
Total Bilirubin: 1.2 mg/dL (ref 0.2–1.2)

## 2018-11-27 LAB — PROTIME-INR
INR: 1
Prothrombin Time: 10.4 s (ref 9.0–11.5)

## 2018-12-11 DIAGNOSIS — S0501XA Injury of conjunctiva and corneal abrasion without foreign body, right eye, initial encounter: Secondary | ICD-10-CM | POA: Diagnosis not present

## 2018-12-19 DIAGNOSIS — E1140 Type 2 diabetes mellitus with diabetic neuropathy, unspecified: Secondary | ICD-10-CM | POA: Diagnosis not present

## 2018-12-19 DIAGNOSIS — N183 Chronic kidney disease, stage 3 (moderate): Secondary | ICD-10-CM | POA: Diagnosis not present

## 2018-12-19 DIAGNOSIS — Z79899 Other long term (current) drug therapy: Secondary | ICD-10-CM | POA: Diagnosis not present

## 2018-12-19 DIAGNOSIS — K7460 Unspecified cirrhosis of liver: Secondary | ICD-10-CM | POA: Diagnosis not present

## 2018-12-19 DIAGNOSIS — D696 Thrombocytopenia, unspecified: Secondary | ICD-10-CM | POA: Diagnosis not present

## 2018-12-26 DIAGNOSIS — N183 Chronic kidney disease, stage 3 (moderate): Secondary | ICD-10-CM | POA: Diagnosis not present

## 2018-12-26 DIAGNOSIS — K7469 Other cirrhosis of liver: Secondary | ICD-10-CM | POA: Diagnosis not present

## 2018-12-26 DIAGNOSIS — D696 Thrombocytopenia, unspecified: Secondary | ICD-10-CM | POA: Diagnosis not present

## 2018-12-26 DIAGNOSIS — E1140 Type 2 diabetes mellitus with diabetic neuropathy, unspecified: Secondary | ICD-10-CM | POA: Diagnosis not present

## 2019-01-02 DIAGNOSIS — E538 Deficiency of other specified B group vitamins: Secondary | ICD-10-CM | POA: Diagnosis not present

## 2019-01-06 DIAGNOSIS — I10 Essential (primary) hypertension: Secondary | ICD-10-CM | POA: Diagnosis not present

## 2019-01-21 DIAGNOSIS — I10 Essential (primary) hypertension: Secondary | ICD-10-CM | POA: Diagnosis not present

## 2019-02-04 DIAGNOSIS — I10 Essential (primary) hypertension: Secondary | ICD-10-CM | POA: Diagnosis not present

## 2019-02-04 DIAGNOSIS — R609 Edema, unspecified: Secondary | ICD-10-CM | POA: Diagnosis not present

## 2019-02-05 DIAGNOSIS — E538 Deficiency of other specified B group vitamins: Secondary | ICD-10-CM | POA: Diagnosis not present

## 2019-02-09 ENCOUNTER — Other Ambulatory Visit: Payer: Self-pay

## 2019-02-09 ENCOUNTER — Encounter (INDEPENDENT_AMBULATORY_CARE_PROVIDER_SITE_OTHER): Payer: Medicaid Other | Admitting: Ophthalmology

## 2019-02-09 DIAGNOSIS — E11319 Type 2 diabetes mellitus with unspecified diabetic retinopathy without macular edema: Secondary | ICD-10-CM | POA: Diagnosis not present

## 2019-02-09 DIAGNOSIS — H43813 Vitreous degeneration, bilateral: Secondary | ICD-10-CM | POA: Diagnosis not present

## 2019-02-09 DIAGNOSIS — I10 Essential (primary) hypertension: Secondary | ICD-10-CM

## 2019-02-09 DIAGNOSIS — E113593 Type 2 diabetes mellitus with proliferative diabetic retinopathy without macular edema, bilateral: Secondary | ICD-10-CM | POA: Diagnosis not present

## 2019-02-09 DIAGNOSIS — H35033 Hypertensive retinopathy, bilateral: Secondary | ICD-10-CM | POA: Diagnosis not present

## 2019-03-12 DIAGNOSIS — E538 Deficiency of other specified B group vitamins: Secondary | ICD-10-CM | POA: Diagnosis not present

## 2019-04-08 DIAGNOSIS — Z79899 Other long term (current) drug therapy: Secondary | ICD-10-CM | POA: Diagnosis not present

## 2019-04-08 DIAGNOSIS — K743 Primary biliary cirrhosis: Secondary | ICD-10-CM | POA: Diagnosis not present

## 2019-04-08 DIAGNOSIS — I10 Essential (primary) hypertension: Secondary | ICD-10-CM | POA: Diagnosis not present

## 2019-04-08 DIAGNOSIS — E1139 Type 2 diabetes mellitus with other diabetic ophthalmic complication: Secondary | ICD-10-CM | POA: Diagnosis not present

## 2019-05-22 DIAGNOSIS — E538 Deficiency of other specified B group vitamins: Secondary | ICD-10-CM | POA: Diagnosis not present

## 2019-06-12 DIAGNOSIS — E1129 Type 2 diabetes mellitus with other diabetic kidney complication: Secondary | ICD-10-CM | POA: Diagnosis not present

## 2019-06-12 DIAGNOSIS — I10 Essential (primary) hypertension: Secondary | ICD-10-CM | POA: Diagnosis not present

## 2019-06-25 DIAGNOSIS — Z23 Encounter for immunization: Secondary | ICD-10-CM | POA: Diagnosis not present

## 2019-06-25 DIAGNOSIS — E538 Deficiency of other specified B group vitamins: Secondary | ICD-10-CM | POA: Diagnosis not present

## 2019-07-13 DIAGNOSIS — E1129 Type 2 diabetes mellitus with other diabetic kidney complication: Secondary | ICD-10-CM | POA: Diagnosis not present

## 2019-07-13 DIAGNOSIS — K7460 Unspecified cirrhosis of liver: Secondary | ICD-10-CM | POA: Diagnosis not present

## 2019-07-13 DIAGNOSIS — Z79899 Other long term (current) drug therapy: Secondary | ICD-10-CM | POA: Diagnosis not present

## 2019-07-13 DIAGNOSIS — I10 Essential (primary) hypertension: Secondary | ICD-10-CM | POA: Diagnosis not present

## 2019-07-20 DIAGNOSIS — K7460 Unspecified cirrhosis of liver: Secondary | ICD-10-CM | POA: Diagnosis not present

## 2019-07-20 DIAGNOSIS — E1122 Type 2 diabetes mellitus with diabetic chronic kidney disease: Secondary | ICD-10-CM | POA: Diagnosis not present

## 2019-07-20 DIAGNOSIS — I10 Essential (primary) hypertension: Secondary | ICD-10-CM | POA: Diagnosis not present

## 2019-08-03 ENCOUNTER — Other Ambulatory Visit (HOSPITAL_COMMUNITY): Payer: Self-pay | Admitting: Internal Medicine

## 2019-08-03 ENCOUNTER — Other Ambulatory Visit: Payer: Self-pay | Admitting: Internal Medicine

## 2019-08-03 DIAGNOSIS — E538 Deficiency of other specified B group vitamins: Secondary | ICD-10-CM | POA: Diagnosis not present

## 2019-08-03 DIAGNOSIS — R0989 Other specified symptoms and signs involving the circulatory and respiratory systems: Secondary | ICD-10-CM

## 2019-08-12 ENCOUNTER — Encounter (INDEPENDENT_AMBULATORY_CARE_PROVIDER_SITE_OTHER): Payer: Medicaid Other | Admitting: Ophthalmology

## 2019-08-12 ENCOUNTER — Other Ambulatory Visit: Payer: Self-pay

## 2019-08-12 DIAGNOSIS — E11319 Type 2 diabetes mellitus with unspecified diabetic retinopathy without macular edema: Secondary | ICD-10-CM

## 2019-08-12 DIAGNOSIS — H35033 Hypertensive retinopathy, bilateral: Secondary | ICD-10-CM | POA: Diagnosis not present

## 2019-08-12 DIAGNOSIS — I10 Essential (primary) hypertension: Secondary | ICD-10-CM

## 2019-08-12 DIAGNOSIS — E113593 Type 2 diabetes mellitus with proliferative diabetic retinopathy without macular edema, bilateral: Secondary | ICD-10-CM | POA: Diagnosis not present

## 2019-08-12 DIAGNOSIS — H43813 Vitreous degeneration, bilateral: Secondary | ICD-10-CM | POA: Diagnosis not present

## 2019-08-17 ENCOUNTER — Ambulatory Visit (HOSPITAL_COMMUNITY)
Admission: RE | Admit: 2019-08-17 | Discharge: 2019-08-17 | Disposition: A | Discharge status: Home / Self Care | Payer: Medicaid Other | Source: Ambulatory Visit | Attending: Internal Medicine | Admitting: Internal Medicine

## 2019-08-17 ENCOUNTER — Other Ambulatory Visit: Payer: Self-pay

## 2019-08-17 DIAGNOSIS — R0989 Other specified symptoms and signs involving the circulatory and respiratory systems: Secondary | ICD-10-CM

## 2019-08-19 ENCOUNTER — Other Ambulatory Visit: Payer: Self-pay

## 2019-08-19 ENCOUNTER — Ambulatory Visit (INDEPENDENT_AMBULATORY_CARE_PROVIDER_SITE_OTHER): Payer: Medicaid Other | Admitting: Cardiovascular Disease

## 2019-08-19 ENCOUNTER — Encounter: Payer: Self-pay | Admitting: Cardiovascular Disease

## 2019-08-19 ENCOUNTER — Telehealth: Payer: Self-pay | Admitting: Cardiovascular Disease

## 2019-08-19 VITALS — BP 147/74 | HR 55 | Ht 70.5 in | Wt 238.0 lb

## 2019-08-19 DIAGNOSIS — I6523 Occlusion and stenosis of bilateral carotid arteries: Secondary | ICD-10-CM

## 2019-08-19 DIAGNOSIS — N183 Chronic kidney disease, stage 3 unspecified: Secondary | ICD-10-CM

## 2019-08-19 DIAGNOSIS — I10 Essential (primary) hypertension: Secondary | ICD-10-CM

## 2019-08-19 MED ORDER — AMLODIPINE BESYLATE 5 MG PO TABS: 7.5000 mg | ORAL_TABLET | Freq: Every day | ORAL | 2 refills | Status: DC

## 2019-08-19 NOTE — Telephone Encounter (Signed)
    Precert needed for:  Renal Art Duplex    Location:  [facility]    Date: Sep 03, 2019

## 2019-08-19 NOTE — Patient Instructions (Signed)
Medication Instructions:   Your physician has recommended you make the following change in your medication:   Increase amlodipine to 7.5 mg daily. Please take 1&1/2 of your 5 mg daily  Continue other medications the same  Labwork:  NONE  Testing/Procedures: Your physician has requested that you have a renal artery duplex. During this test, an ultrasound is used to evaluate blood flow to the kidneys. Allow one hour for this exam. Do not eat after midnight the day before and avoid carbonated beverages. Take your medications as you usually do.  Follow-Up:  Your physician recommends that you schedule a follow-up appointment in: 3 months (virtual)  Any Other Special Instructions Will Be Listed Below (If Applicable).  If you need a refill on your cardiac medications before your next appointment, please call your pharmacy.

## 2019-08-19 NOTE — Progress Notes (Signed)
CARDIOLOGY CONSULT NOTE  Patient ID: [name] [name] MRN: [id number] DOB/AGE: 07-Feb-1956 64 y.o.  Admit date: (Not on file) Primary Physician: Asencion Noble, MD  Reason for Consultation: Hypertension management  HPI: [name] [name] is a 64 y.o. male who is being seen today for the evaluation of hypertension management at the request of Asencion Noble, MD.   I reviewed notes from his PCP.    He has a history of type 2 diabetes, cryptogenic cirrhosis status post TIPS, GERD, cholelithiasis, and hypertension.    Notes state that blood pressure control has been challenging in spite of 4 drug therapy.  Heart rates are typically in the 50 bpm range.  He has previously reported some dizziness upon standing but he has not been orthostatic.  It appears his mother requested a cardiology consult.  He apparently had some difficulties with low blood pressures while using a chainsaw to cut wood in November 2020.  He was not hypoglycemic at that time.  PCP felt that dizziness was related to clonidine and not due to bradycardia or hypoglycemia.  Consideration for hydralazine twice daily was given.  I personally reviewed an ECG performed on 09/24/2018 which demonstrated sinus rhythm with nonspecific ST segment and T wave abnormalities.  The patient currently denies any symptoms of chest pain, palpitations, shortness of breath, lightheadedness, dizziness, leg swelling, orthopnea, PND, and syncope.  He denies abdominal pain.  I reviewed a BP log dating back to June 2020 which demonstrated several elevated readings.  I reviewed labs dated 07/13/2019 which showed BUN 26 and creatinine 1.53.  Hemoglobin was 12.9.  Platelets were mildly low at 124.   No Known Allergies  Current Outpatient Medications  Medication Sig Dispense Refill  . amLODipine (NORVASC) 5 MG tablet Take 5 mg by mouth at bedtime.    Marland Kitchen aspirin EC 81 MG tablet Take 81 mg by mouth daily.    . carvedilol (COREG) 6.25 MG tablet Take  6.25 mg by mouth 2 (two) times daily with a meal.    . cloNIDine (CATAPRES) 0.3 MG tablet Take 0.3 mg by mouth 3 (three) times daily.    . Cyanocobalamin (VITAMIN B-12 IJ) Inject 1 mL as directed every 30 (thirty) days.     . furosemide (LASIX) 20 MG tablet Take 20 mg by mouth every other day.    Marland Kitchen glimepiride (AMARYL) 4 MG tablet Take 4 mg by mouth daily before breakfast.    . LANTUS SOLOSTAR 100 UNIT/ML Solostar Pen Inject 30 Units into the skin daily.    Marland Kitchen losartan (COZAAR) 100 MG tablet Take 100 mg by mouth daily.    Marland Kitchen omeprazole (PRILOSEC) 20 MG capsule TAKE 1 CAPSULE BY MOUTH EVERY MORNING FOR ACID REFLUX. 30 capsule 5  . zolpidem (AMBIEN) 5 MG tablet Take 2.5-5 mg by mouth at bedtime as needed.     No current facility-administered medications for this visit.    Past Medical History:  Diagnosis Date  . Anemia   . Cryptogenic cirrhosis ([place])   . Diabetes mellitus   . GERD (gastroesophageal reflux disease)   . Hypertension   . Liver disease     Past Surgical History:  Procedure Laterality Date  . COLONOSCOPY    . INCISION AND DRAINAGE OF WOUND Right 12/28/2016   Procedure: IRRIGATION AND DEBRIDEMENT WOUND;  Surgeon: Leanora Cover, MD;  Location: [place];  Service: Orthopedics;  Laterality: Right;  . IR RADIOLOGIST EVAL & MGMT  10/08/2018  .  LIVER BIOPSY    . NERVE, TENDON AND ARTERY REPAIR Right 12/28/2016   Procedure: NERVE, TENDON AND ARTERY REPAIR;  Surgeon: Betha Loa, MD;  Location: [facility];  Service: Orthopedics;  Laterality: Right;  . PERCUTANEOUS PINNING Right 12/28/2016   Procedure: PERCUTANEOUS PINNING EXTREMITY;  Surgeon: Betha Loa, MD;  Location: [facility];  Service: Orthopedics;  Laterality: Right;  . TIPS PROCEDURE  2008  . UPPER GASTROINTESTINAL ENDOSCOPY      Social History   Socioeconomic History  . Marital status: Single    Spouse name: Not on file  . Number of children: Not on file  . Years of  education: Not on file  . Highest education level: Not on file  Occupational History  . Not on file  Tobacco Use  . Smoking status: Current Every Day Smoker    Packs/day: 1.00    Years: 40.00    Pack years: 40.00    Types: Cigarettes    Start date: 01/23/1981  . Smokeless tobacco: Never Used  . Tobacco comment: patient states that in the 40 years he has stopped smoking then start again  Substance and Sexual Activity  . Alcohol use: No    Alcohol/week: 0.0 standard drinks  . Drug use: No  . Sexual activity: Not on file  Other Topics Concern  . Not on file  Social History Narrative  . Not on file   Social Determinants of Health   Financial Resource Strain:   . Difficulty of Paying Living Expenses: Not on file  Food Insecurity:   . Worried About Programme researcher, broadcasting/film/video in the Last Year: Not on file  . Ran Out of Food in the Last Year: Not on file  Transportation Needs:   . Lack of Transportation (Medical): Not on file  . Lack of Transportation (Non-Medical): Not on file  Physical Activity:   . Days of Exercise per Week: Not on file  . Minutes of Exercise per Session: Not on file  Stress:   . Feeling of Stress : Not on file  Social Connections:   . Frequency of Communication with Friends and Family: Not on file  . Frequency of Social Gatherings with Friends and Family: Not on file  . Attends Religious Services: Not on file  . Active Member of Clubs or Organizations: Not on file  . Attends Banker Meetings: Not on file  . Marital Status: Not on file  Intimate Partner Violence:   . Fear of Current or Ex-Partner: Not on file  . Emotionally Abused: Not on file  . Physically Abused: Not on file  . Sexually Abused: Not on file     No family history of premature CAD in 1st degree relatives.  Current Meds  Medication Sig  . amLODipine (NORVASC) 5 MG tablet Take 5 mg by mouth at bedtime.  Marland Kitchen aspirin EC 81 MG tablet Take 81 mg by mouth daily.  . carvedilol (COREG)  6.25 MG tablet Take 6.25 mg by mouth 2 (two) times daily with a meal.  . cloNIDine (CATAPRES) 0.3 MG tablet Take 0.3 mg by mouth 3 (three) times daily.  . Cyanocobalamin (VITAMIN B-12 IJ) Inject 1 mL as directed every 30 (thirty) days.   . furosemide (LASIX) 20 MG tablet Take 20 mg by mouth every other day.  Marland Kitchen glimepiride (AMARYL) 4 MG tablet Take 4 mg by mouth daily before breakfast.  . LANTUS SOLOSTAR 100 UNIT/ML Solostar Pen Inject 30 Units into the skin  daily.  . losartan (COZAAR) 100 MG tablet Take 100 mg by mouth daily.  Marland Kitchen omeprazole (PRILOSEC) 20 MG capsule TAKE 1 CAPSULE BY MOUTH EVERY MORNING FOR ACID REFLUX.  Marland Kitchen zolpidem (AMBIEN) 5 MG tablet Take 2.5-5 mg by mouth at bedtime as needed.      Review of systems complete and found to be negative unless listed above in HPI    Physical exam Blood pressure (!) 147/74, pulse (!) 55, height 5' 10.5" (1.791 m), weight 238 lb (108 kg), SpO2 100 %. General: NAD Neck: No JVD, no thyromegaly or thyroid nodule.  Lungs: Clear to auscultation bilaterally with normal respiratory effort. CV: Nondisplaced PMI. Regular rate and rhythm, normal S1/S2, no S3/S4, no murmur.  No peripheral edema.  No carotid bruit.    Abdomen: Soft, nontender, no distention.  Skin: Intact without lesions or rashes.  Neurologic: Alert and oriented x 3.  Psych: Normal affect. Extremities: No clubbing or cyanosis.  HEENT: Normal.   ECG: Most recent ECG reviewed.   Labs: Lab Results  Component Value Date/Time   K 4.0 11/26/2018 10:56 AM   BUN 24 11/26/2018 10:56 AM   BUN 23 11/03/2018 08:15 AM   CREATININE 1.20 11/26/2018 10:56 AM   ALT 23 11/26/2018 10:56 AM   HGB 12.6 (L) 11/26/2018 10:56 AM   HGB 11.0 (L) 11/03/2018 08:15 AM     Lipids: No results found for: LDLCALC, LDLDIRECT, CHOL, TRIG, HDL      ASSESSMENT AND PLAN:  1.  Hypertension: Blood pressure is mildly elevated.  He is on multiple antihypertensive agents as noted above.  In order to rule out  secondary causes, I will obtain renal artery Dopplers to evaluate for renal artery stenosis.  Creatinine is elevated at 1.53 on 07/13/2019.  I will increase amlodipine to 7.5 mg every morning.  2.  Carotid artery stenosis: Carotid Dopplers on 08/17/2019 showed 50 to 69% left ICA stenosis and less than 50% right ICA stenosis.  This will require annual imaging.  3.  Chronic kidney disease stage III: Creatinine 1.53 on 07/13/2019.  Currently on Lasix 20 mg every other day and losartan 100 mg daily.  I will obtain renal artery Dopplers.   Disposition: Follow up in 3 months virtual visit  Signed: Prentice Docker, M.D., F.A.C.C.  08/19/2019, 9:04 AM

## 2019-08-31 DIAGNOSIS — L03115 Cellulitis of right lower limb: Secondary | ICD-10-CM | POA: Diagnosis not present

## 2019-09-03 ENCOUNTER — Ambulatory Visit (INDEPENDENT_AMBULATORY_CARE_PROVIDER_SITE_OTHER): Payer: Medicaid Other

## 2019-09-03 ENCOUNTER — Telehealth: Payer: Self-pay | Admitting: *Deleted

## 2019-09-03 ENCOUNTER — Other Ambulatory Visit: Payer: Self-pay

## 2019-09-03 DIAGNOSIS — I10 Essential (primary) hypertension: Secondary | ICD-10-CM

## 2019-09-03 NOTE — Telephone Encounter (Signed)
Patient returned call

## 2019-09-03 NOTE — Telephone Encounter (Signed)
-----   Message from [name], MD sent at 09/03/2019  9:53 AM EST ----- Some suggestion of at least mild blockage of arteries to kidneys.  Awaiting final report by physician.

## 2019-09-07 DIAGNOSIS — I10 Essential (primary) hypertension: Secondary | ICD-10-CM | POA: Diagnosis not present

## 2019-09-07 DIAGNOSIS — E1129 Type 2 diabetes mellitus with other diabetic kidney complication: Secondary | ICD-10-CM | POA: Diagnosis not present

## 2019-09-07 NOTE — Telephone Encounter (Signed)
[name] [name], [place]  02/06/1637 4:53 PM EST    Detailed message left on voice mail. Copy to pmd & follow up scheduled for 11/17/2019 with Dr. Purvis Sheffield.    [name] [name], [place]  6/46/8032 1:22 PM EST    Left message to return call.   [name], MD  09/03/2019 10:35 AM EST    Mild renal artery blockages. Will monitor in outpatient setting.

## 2019-09-10 DIAGNOSIS — E538 Deficiency of other specified B group vitamins: Secondary | ICD-10-CM | POA: Diagnosis not present

## 2019-10-11 DIAGNOSIS — Z23 Encounter for immunization: Secondary | ICD-10-CM | POA: Diagnosis not present

## 2019-11-01 DIAGNOSIS — Z23 Encounter for immunization: Secondary | ICD-10-CM | POA: Diagnosis not present

## 2019-11-02 IMAGING — US ULTRASOUND ABDOMEN COMPLETE
1 series · 13 of 25 positions shown · non-contrast
Comparison: None.

CLINICAL DATA: Cirrhosis..  Upper abdominal pain

EXAM:
ABDOMEN ULTRASOUND COMPLETE

[Series 1: ultrasound abdomen complete · 0.24mm/px · 13 of 95 slices shown]
[im 1/95]
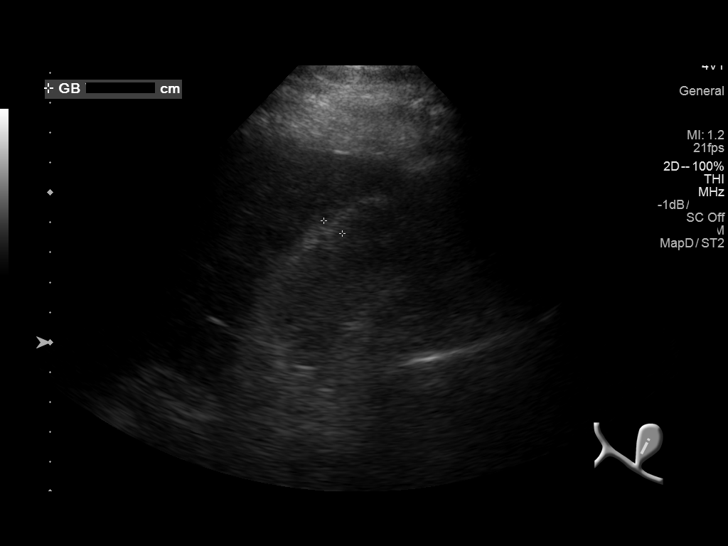
[im 8/95]
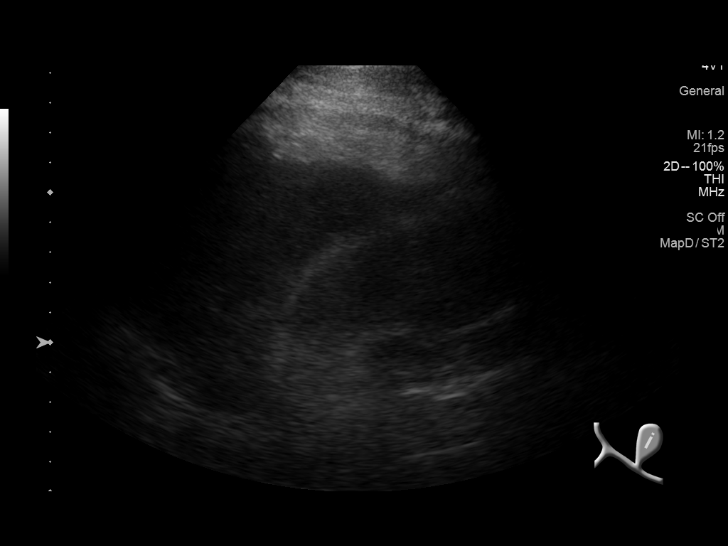
[im 16/95]
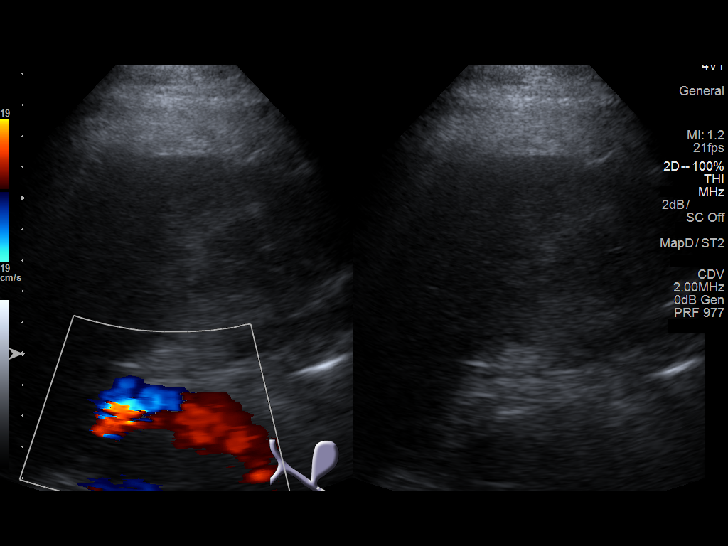
[im 24/95]
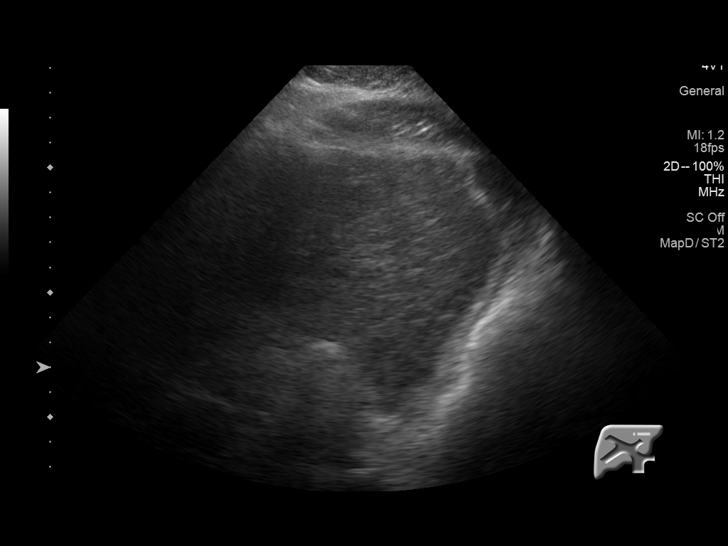
[im 32/95]
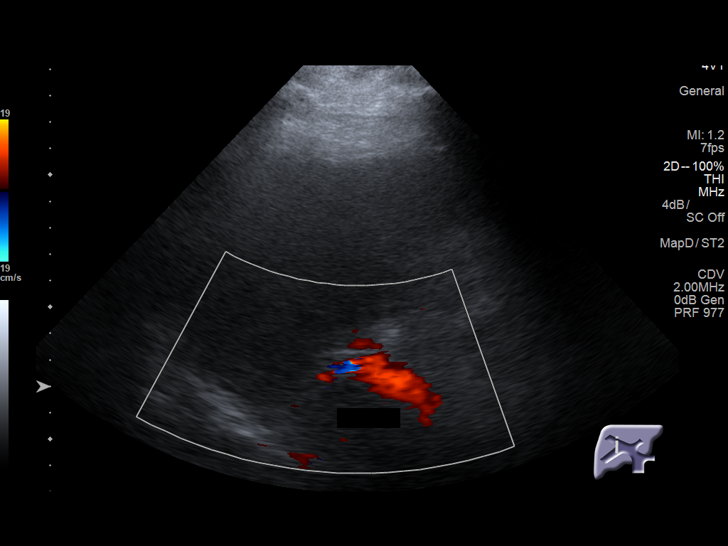
[im 40/95]
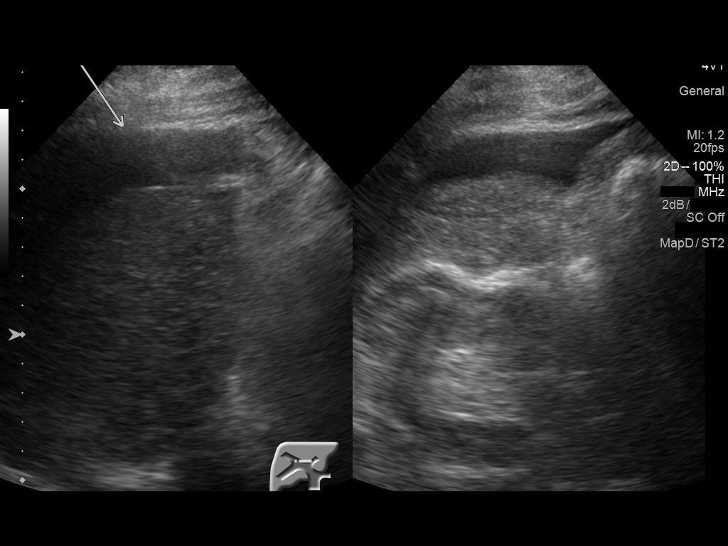
[im 48/95]
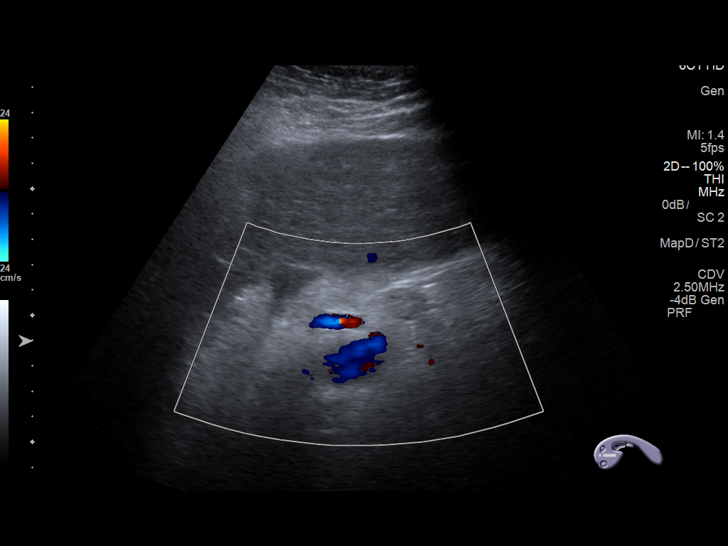
[im 55/95]
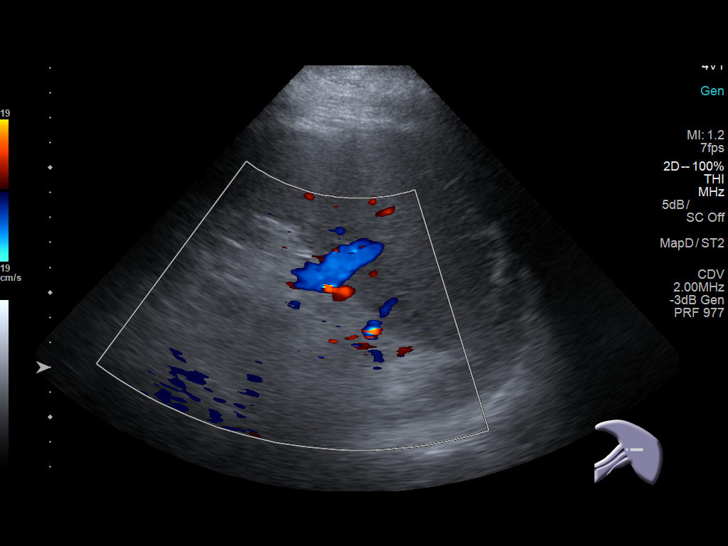
[im 63/95]
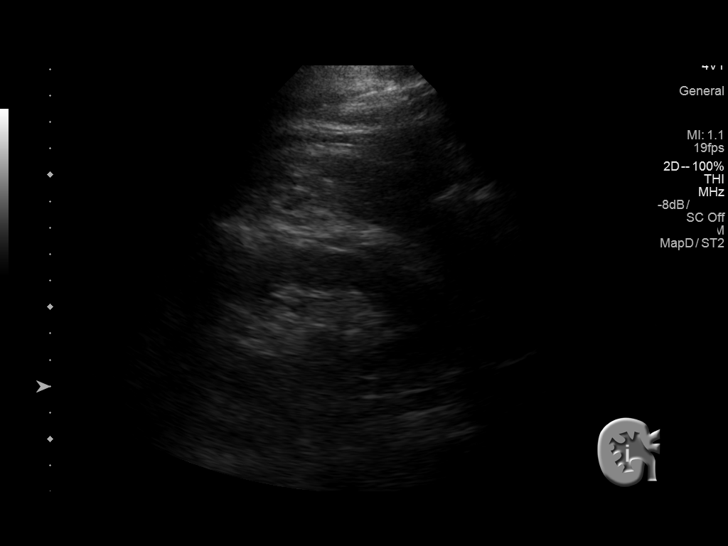
[im 71/95]
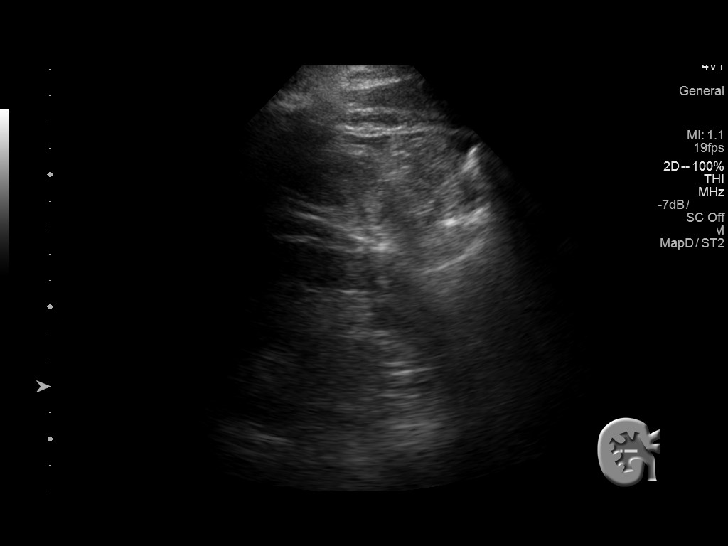
[im 79/95]
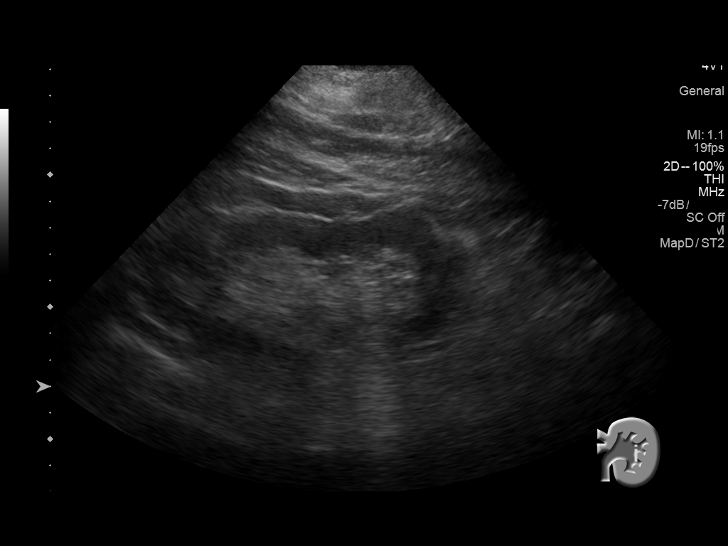
[im 87/95]
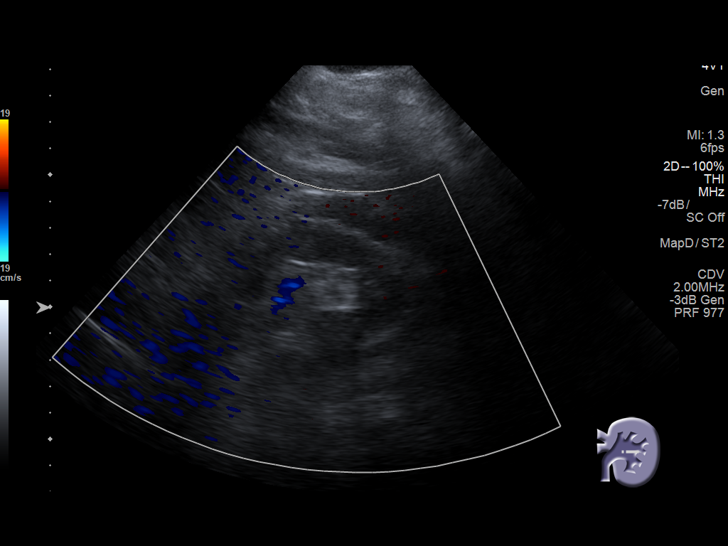
[im 95/95]
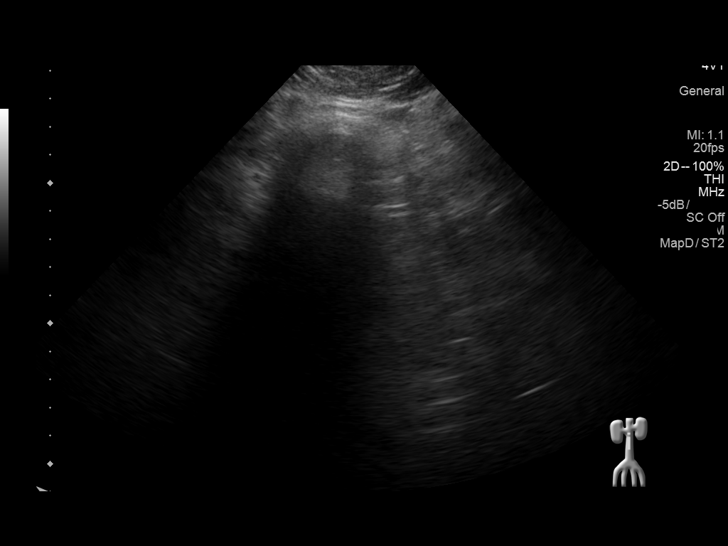

[13 of 25 positions shown; findings below may reference images not displayed]

FINDINGS: Gallbladder: There is a 10 mm in size calculus adherent in the neck
of the gallbladder. Gallbladder contains significant sludge. The
gallbladder wall is diffusely thickened. There is no pericholecystic
fluid. No sonographic Murphy sign noted by sonographer.

Common bile duct: Diameter: 4 mm. No demonstrable intrahepatic,
common hepatic, or common bile duct dilatation.

Liver: No focal lesion identified. Liver has a nodular contour with
diffuse increased echogenicity. A TIPS catheter is present and noted
to be patent.. Portal vein is patent on color Doppler imaging with
normal direction of blood flow towards the liver.

IVC: No abnormality visualized in visualized regions. Much of the
infrahepatic inferior vena cava is obscured by gas.

Pancreas: Visualized portion unremarkable. Most of the pancreas is
obscured by gas.

Spleen: Size and appearance within normal limits. Spleen measures
12.1 cm.

Right Kidney: Length: 11.7 cm. Echogenicity within normal limits. No
mass or hydronephrosis visualized.

Left Kidney: Length: 12.7 cm. Echogenicity within normal limits. No
mass or hydronephrosis visualized.

Abdominal aorta: No aneurysm visualized in visualized regions. Much
of the aorta is obscured by gas.

Other findings: There is a mild degree of ascites present.
IMPRESSION: 1. Gallstone adherent in the neck of the gallbladder. Sludge
throughout gallbladder. Gallbladder wall is thickened. While
gallbladder wall thickening may be seen in association with ascites,
this finding in association with the gallstone adherent in the neck
of the gallbladder raises concern for a degree of acute
cholecystitis. It may be prudent to consider nuclear medicine
hepatobiliary imaging study to assess for cystic duct patency.

2. Appearance of the liver is indicative of hepatic cirrhosis. While
no focal liver lesions are evident, it must be cautioned that the
sensitivity of ultrasound for detection of focal liver lesions is
diminished significantly in this circumstance. TIPS catheter is
present, patent. The flow in the portal vein is in the anatomic
direction.

3. Most of the pancreas is obscured by gas. Much of the inferior
vena cava and aorta are obscured by gas.

4.  Spleen appears toward the upper limits of normal in size.

## 2019-11-17 ENCOUNTER — Encounter: Payer: Self-pay | Admitting: Cardiovascular Disease

## 2019-11-17 ENCOUNTER — Telehealth (INDEPENDENT_AMBULATORY_CARE_PROVIDER_SITE_OTHER): Payer: Medicaid Other | Admitting: Cardiovascular Disease

## 2019-11-17 VITALS — BP 152/66 | HR 52 | Ht 70.5 in | Wt 240.0 lb

## 2019-11-17 DIAGNOSIS — N183 Chronic kidney disease, stage 3 unspecified: Secondary | ICD-10-CM | POA: Diagnosis not present

## 2019-11-17 DIAGNOSIS — I6523 Occlusion and stenosis of bilateral carotid arteries: Secondary | ICD-10-CM | POA: Diagnosis not present

## 2019-11-17 DIAGNOSIS — I10 Essential (primary) hypertension: Secondary | ICD-10-CM | POA: Diagnosis not present

## 2019-11-17 DIAGNOSIS — I701 Atherosclerosis of renal artery: Secondary | ICD-10-CM

## 2019-11-17 MED ORDER — AMLODIPINE BESYLATE 10 MG PO TABS: 10.0000 mg | ORAL_TABLET | Freq: Every day | ORAL | 6 refills | Status: DC

## 2019-11-17 NOTE — Patient Instructions (Addendum)
Medication Instructions:   Increase Amlodipine to 10mg  every evening.   You can double up on the 5mg  tablet so you can use up supply you already have.   New 10mg  tablet sent to [facility] today.   Continue all other medications.    Labwork: none  Testing/Procedures: none  Follow-Up: 6 months   Any Other Special Instructions Will Be Listed Below (If Applicable).  If you need a refill on your cardiac medications before your next appointment, please call your pharmacy.

## 2019-11-17 NOTE — Addendum Note (Signed)
Addended by: Lesle Chris on: 11/17/2019 09:19 AM   Modules accepted: Orders

## 2019-11-17 NOTE — Progress Notes (Signed)
Virtual Visit via Telephone Note   This visit type was conducted due to national recommendations for restrictions regarding the COVID-19 Pandemic (e.g. social distancing) in an effort to limit this patient's exposure and mitigate transmission in our community.  Due to his co-morbid illnesses, this patient is at least at moderate risk for complications without adequate follow up.  This format is felt to be most appropriate for this patient at this time.  The patient did not have access to video technology/had technical difficulties with video requiring transitioning to audio format only (telephone).  All issues noted in this document were discussed and addressed.  No physical exam could be performed with this format.  Please refer to the patient's chart for his  consent to telehealth for [facility].   The patient was identified using 2 identifiers.  Date:  11/17/2019   ID:  [name] [name], DOB 1956/05/03, MRN [id number]  Patient Location: Home Provider Location: Office  PCP:  [name], MD  Cardiologist:  Prentice Docker, MD  Electrophysiologist:  None   Evaluation Performed:  Follow-Up Visit  Chief Complaint:  HTN  History of Present Illness:    [name] [name] is a 64 y.o. male with a history of type 2 diabetes, cryptogenic cirrhosis status post TIPS, GERD, cholelithiasis, and hypertension.    Yesterday, his blood pressure in the morning prior to taking medications was elevated with a systolic reading in the 170 range.  After he takes his morning medications, blood pressure normalizes within 30 minutes to an hour.  The patient denies any symptoms of chest pain, palpitations, shortness of breath, lightheadedness, dizziness, leg swelling, orthopnea, PND, and syncope.    Past Medical History:  Diagnosis Date  . Anemia   . Cryptogenic cirrhosis ([facility])   . Diabetes mellitus   . GERD (gastroesophageal reflux disease)   . Hypertension   . Liver disease    Past Surgical  History:  Procedure Laterality Date  . COLONOSCOPY    . INCISION AND DRAINAGE OF WOUND Right 12/28/2016   Procedure: IRRIGATION AND DEBRIDEMENT WOUND;  Surgeon: Betha Loa, MD;  Location: [facility];  Service: Orthopedics;  Laterality: Right;  . IR RADIOLOGIST EVAL & MGMT  10/08/2018  . LIVER BIOPSY    . NERVE, TENDON AND ARTERY REPAIR Right 12/28/2016   Procedure: NERVE, TENDON AND ARTERY REPAIR;  Surgeon: Betha Loa, MD;  Location: [facility];  Service: Orthopedics;  Laterality: Right;  . PERCUTANEOUS PINNING Right 12/28/2016   Procedure: PERCUTANEOUS PINNING EXTREMITY;  Surgeon: Betha Loa, MD;  [facility];  Service: Orthopedics;  Laterality: Right;  . TIPS PROCEDURE  2008  . UPPER GASTROINTESTINAL ENDOSCOPY       Current Meds  Medication Sig  . amLODipine (NORVASC) 5 MG tablet Take 1.5 tablets (7.5 mg total) by mouth at bedtime.  Marland Kitchen aspirin EC 81 MG tablet Take 81 mg by mouth daily.  . carvedilol (COREG) 6.25 MG tablet Take 6.25 mg by mouth 2 (two) times daily with a meal.  . cloNIDine (CATAPRES) 0.3 MG tablet Take 0.3 mg by mouth 3 (three) times daily.  . Cyanocobalamin (VITAMIN B-12 IJ) Inject 1 mL as directed every 30 (thirty) days.   . furosemide (LASIX) 20 MG tablet Take 20 mg by mouth every other day.  Marland Kitchen glimepiride (AMARYL) 4 MG tablet Take 4 mg by mouth daily before breakfast.  . LANTUS SOLOSTAR 100 UNIT/ML Solostar Pen Inject 30 Units into the skin daily.  Marland Kitchen  losartan (COZAAR) 100 MG tablet Take 100 mg by mouth daily.  Marland Kitchen omeprazole (PRILOSEC) 20 MG capsule TAKE 1 CAPSULE BY MOUTH EVERY MORNING FOR ACID REFLUX.  Marland Kitchen zolpidem (AMBIEN) 5 MG tablet Take 2.5-5 mg by mouth at bedtime as needed.     Allergies:   Patient has no known allergies.   Social History   Tobacco Use  . Smoking status: Current Every Day Smoker    Packs/day: 1.00    Years: 40.00    Pack years: 40.00    Types: Cigarettes    Start date: 01/23/1981    . Smokeless tobacco: Never Used  . Tobacco comment: patient states that in the 40 years he has stopped smoking then start again  Substance Use Topics  . Alcohol use: No    Alcohol/week: 0.0 standard drinks  . Drug use: No     Family Hx: The patient's family history includes Diabetes in his father; Healthy in his mother, sister, and sister; Heart disease in his father.  ROS:   Please see the history of present illness.     All other systems reviewed and are negative.   Prior CV studies:   The following studies were reviewed today:  Renal artery Dopplers 09/03/2019:  Summary:  Largest Aortic Diameter: 2.4 cm    Renal:    Right: Normal size right kidney. Normal cortical thickness of right     kidney. 1-59% stenosis of the right renal artery. RRV flow     present. Abnormal right Resistive Index.  Left: Normal size of left kidney. Abnormal left Resisitve Index.     Normal cortical thickness of the left kidney. LRV flow     present. 1-59% stenosis of the left renal artery. Upper end     of scale.  Mesenteric:  Normal Celiac artery and Superior Mesenteric artery findings.   Labs/Other Tests and Data Reviewed:    EKG:  No ECG reviewed.  Recent Labs: 11/26/2018: ALT 23; BUN 24; Creat 1.20; Hemoglobin 12.6; Platelets 118; Potassium 4.0; Sodium 141   Recent Lipid Panel No results found for: CHOL, TRIG, HDL, CHOLHDL, LDLCALC, LDLDIRECT  Wt Readings from Last 3 Encounters:  11/17/19 240 lb (108.9 kg)  08/19/19 238 lb (108 kg)  10/28/18 238 lb 9.6 oz (108.2 kg)     Objective:    Vital Signs:  BP (!) 152/66 Comment: after morning medication  Pulse (!) 52   Ht 5' 10.5" (1.791 m)   Wt 240 lb (108.9 kg)   BMI 33.95 kg/m    VITAL SIGNS:  reviewed  ASSESSMENT & PLAN:    1.  Hypertension: Blood pressure is elevated.  He is on multiple antihypertensive agents as noted above.  Renal artery Dopplers showed only mild bilateral renal artery stenosis.  I  will increase amlodipine to 10 mg every evening.  2.  Carotid artery stenosis: Carotid Dopplers on 08/17/2019 showed 50 to 69% left ICA stenosis and less than 50% right ICA stenosis.  This will require annual imaging.  3.  Chronic kidney disease stage III: Creatinine 1.53 on 07/13/2019.  Currently on Lasix 20 mg every other day and losartan 100 mg daily.  Renal artery Dopplers showed only mild bilateral renal artery stenosis.  4. Renal artery stenosis:  Renal artery Dopplers on 09/03/2019 showed only mild bilateral renal artery stenosis.    COVID-19 Education: The signs and symptoms of COVID-19 were discussed with the patient and how to seek care for testing (follow up with PCP or arrange E-visit).  The importance of social distancing was discussed today.  Time:   Today, I have spent 20 minutes with the patient with telehealth technology discussing the above problems.     Medication Adjustments/Labs and Tests Ordered: Current medicines are reviewed at length with the patient today.  Concerns regarding medicines are outlined above.   Tests Ordered: No orders of the defined types were placed in this encounter.   Medication Changes: No orders of the defined types were placed in this encounter.   Follow Up:  Virtual Visit  in 6 month(s)  Signed, Prentice Docker, MD  11/17/2019 9:07 AM    [facility]

## 2019-11-30 ENCOUNTER — Telehealth: Payer: Self-pay | Admitting: Cardiovascular Disease

## 2019-11-30 MED ORDER — AMLODIPINE BESYLATE 10 MG PO TABS: 10.0000 mg | ORAL_TABLET | Freq: Every day | ORAL | 6 refills | Status: AC

## 2019-11-30 NOTE — Telephone Encounter (Signed)
It is okay to refill this medication.  Thank you

## 2019-11-30 NOTE — Telephone Encounter (Signed)
Patient states that [place] that they cannot renew amLODipine (NORVASC) 10 MG tablet .Dr. Purvis Sheffield is not contracted with Medicaid. Also, states that the medication was increased.

## 2019-12-02 DIAGNOSIS — E1129 Type 2 diabetes mellitus with other diabetic kidney complication: Secondary | ICD-10-CM | POA: Diagnosis not present

## 2019-12-02 DIAGNOSIS — I10 Essential (primary) hypertension: Secondary | ICD-10-CM | POA: Diagnosis not present

## 2019-12-02 DIAGNOSIS — Z79899 Other long term (current) drug therapy: Secondary | ICD-10-CM | POA: Diagnosis not present

## 2019-12-02 DIAGNOSIS — K7460 Unspecified cirrhosis of liver: Secondary | ICD-10-CM | POA: Diagnosis not present

## 2019-12-09 DIAGNOSIS — E1122 Type 2 diabetes mellitus with diabetic chronic kidney disease: Secondary | ICD-10-CM | POA: Diagnosis not present

## 2019-12-09 DIAGNOSIS — N1831 Chronic kidney disease, stage 3a: Secondary | ICD-10-CM | POA: Diagnosis not present

## 2019-12-09 DIAGNOSIS — Z6835 Body mass index (BMI) 35.0-35.9, adult: Secondary | ICD-10-CM | POA: Diagnosis not present

## 2019-12-09 DIAGNOSIS — K7460 Unspecified cirrhosis of liver: Secondary | ICD-10-CM | POA: Diagnosis not present

## 2019-12-09 DIAGNOSIS — I10 Essential (primary) hypertension: Secondary | ICD-10-CM | POA: Diagnosis not present

## 2020-02-11 ENCOUNTER — Other Ambulatory Visit: Payer: Self-pay

## 2020-02-11 ENCOUNTER — Encounter (INDEPENDENT_AMBULATORY_CARE_PROVIDER_SITE_OTHER): Payer: Medicaid Other | Admitting: Ophthalmology

## 2020-02-11 DIAGNOSIS — E113593 Type 2 diabetes mellitus with proliferative diabetic retinopathy without macular edema, bilateral: Secondary | ICD-10-CM | POA: Diagnosis not present

## 2020-02-11 DIAGNOSIS — H35033 Hypertensive retinopathy, bilateral: Secondary | ICD-10-CM

## 2020-02-11 DIAGNOSIS — E11319 Type 2 diabetes mellitus with unspecified diabetic retinopathy without macular edema: Secondary | ICD-10-CM | POA: Diagnosis not present

## 2020-02-11 DIAGNOSIS — H43813 Vitreous degeneration, bilateral: Secondary | ICD-10-CM | POA: Diagnosis not present

## 2020-02-11 DIAGNOSIS — I10 Essential (primary) hypertension: Secondary | ICD-10-CM | POA: Diagnosis not present

## 2020-03-09 DIAGNOSIS — Z79899 Other long term (current) drug therapy: Secondary | ICD-10-CM | POA: Diagnosis not present

## 2020-03-09 DIAGNOSIS — N183 Chronic kidney disease, stage 3 unspecified: Secondary | ICD-10-CM | POA: Diagnosis not present

## 2020-03-09 DIAGNOSIS — E1129 Type 2 diabetes mellitus with other diabetic kidney complication: Secondary | ICD-10-CM | POA: Diagnosis not present

## 2020-03-09 DIAGNOSIS — I10 Essential (primary) hypertension: Secondary | ICD-10-CM | POA: Diagnosis not present

## 2020-03-09 DIAGNOSIS — K7460 Unspecified cirrhosis of liver: Secondary | ICD-10-CM | POA: Diagnosis not present

## 2020-04-08 ENCOUNTER — Other Ambulatory Visit: Payer: Self-pay | Admitting: Internal Medicine

## 2020-04-08 DIAGNOSIS — K7460 Unspecified cirrhosis of liver: Secondary | ICD-10-CM

## 2020-04-14 ENCOUNTER — Encounter (HOSPITAL_COMMUNITY): Payer: Self-pay

## 2020-04-14 ENCOUNTER — Ambulatory Visit (HOSPITAL_COMMUNITY): Payer: Medicaid Other

## 2020-05-23 IMAGING — MR MRI ABDOMEN WITH AND WITHOUT CONTRAST
10 of 18 series · 24 of 48 positions shown · IV contrast (10ml gadavist)
Comparison: No prior abdominal MRI. CT the abdomen and pelvis
10/08/2018.

CLINICAL DATA: 62-year-old male with history of suspicious lesion
in the liver noted on recent CT examination. Follow-up study.

EXAM:
MRI ABDOMEN WITHOUT AND WITH CONTRAST
TECHNIQUE: Multiplanar multisequence MR imaging of the abdomen was performed
both before and after the administration of intravenous contrast.
CONTRAST:  10 mL of Gadavist.

[Series 3: T2 · coronal · 5.0mm · 1.31mm/px · 1 of 36 slices shown (1 of 2)]
[im 1/36]
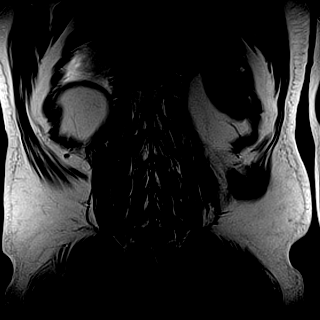

[Series 4: t2fs axial blade · axial · 4.0mm · 1.26mm/px · 1 of 48 slices shown]
[im 1/48]
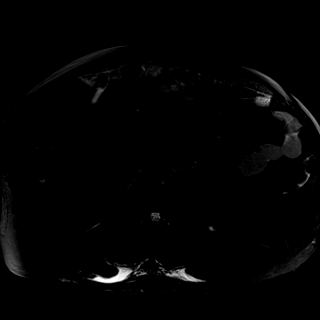

[Series 5: T1 · axial · 4.0mm · 0.66mm/px · z∈[-102,+150]mm · 2 of 64 slices shown (1 of 2)]
[im 1/64]
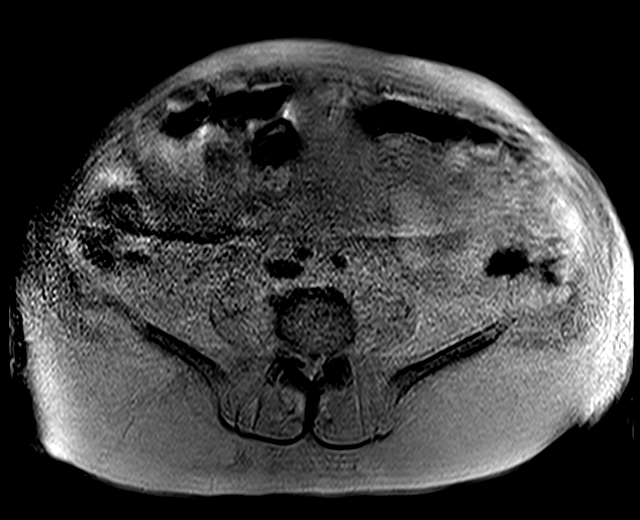
[im 64/64]
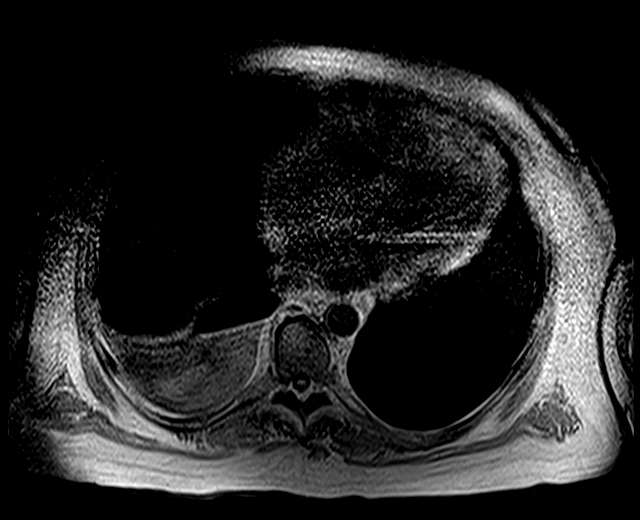

[Series 6: T1 · axial · 4.0mm · 0.66mm/px · z∈[-102,+150]mm · 3 of 64 slices shown (2 of 2)]
[im 1/64]
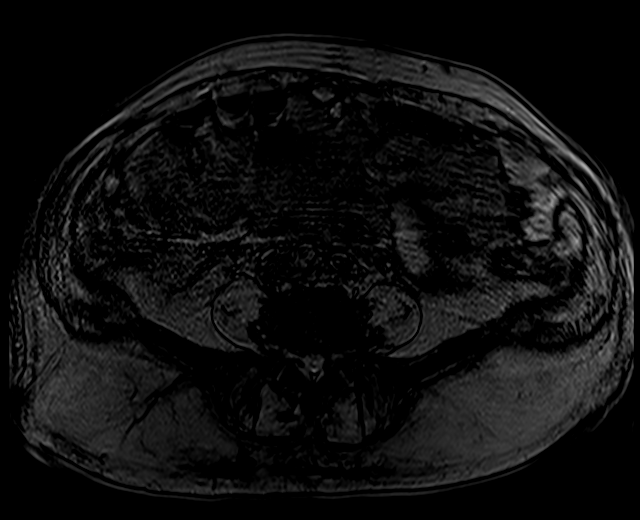
[im 32/64]
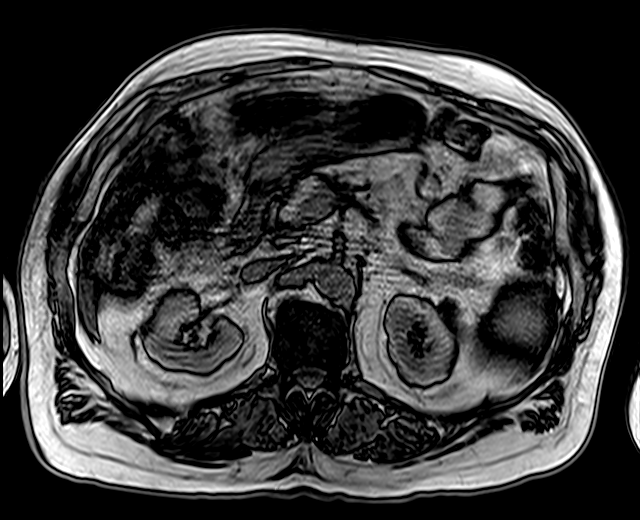
[im 64/64]
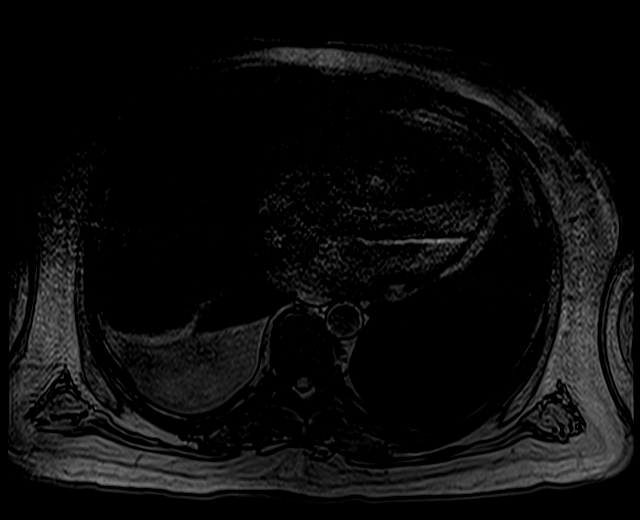

[Series 9: DWI · axial · 5.0mm · 1.08mm/px · z∈[-71,+163]mm · 3 of 80 slices shown (1 of 2)]
[im 1/80]
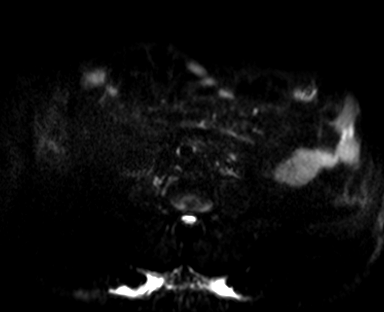
[im 40/80]
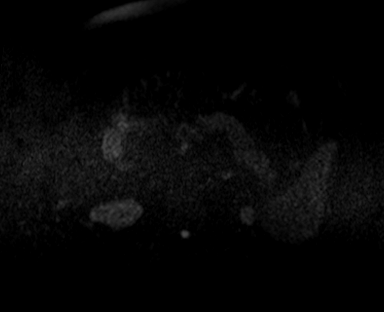
[im 80/80]
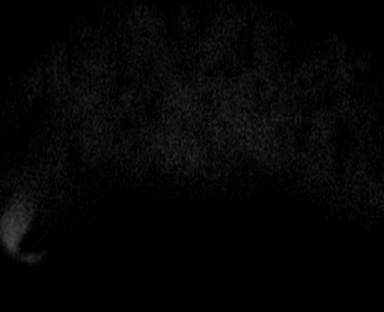

[Series 10: DWI · axial · 5.0mm · 1.08mm/px · z∈[-71,+163]mm · 2 of 40 slices shown (2 of 2)]
[im 1/40]
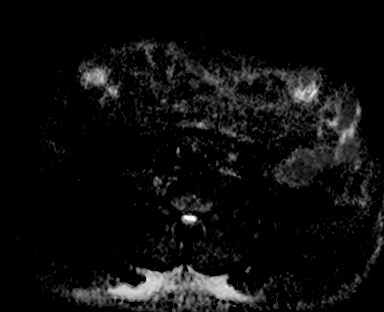
[im 40/40]
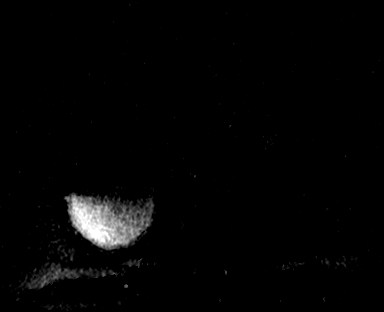

[Series 11: bSSFP · axial · 4.0mm · 0.83mm/px · z∈[-92,+184]mm · 3 of 70 slices shown]
[im 1/70]
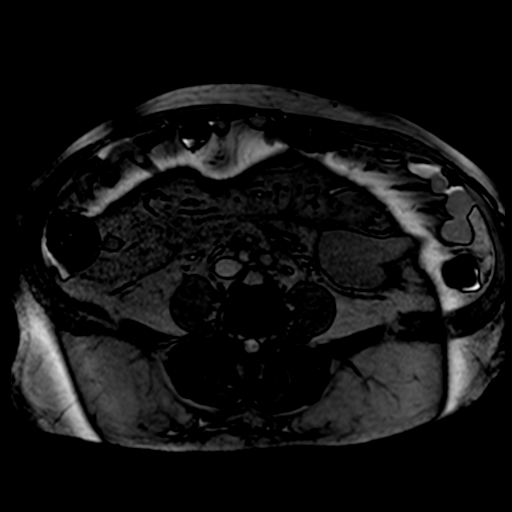
[im 35/70]
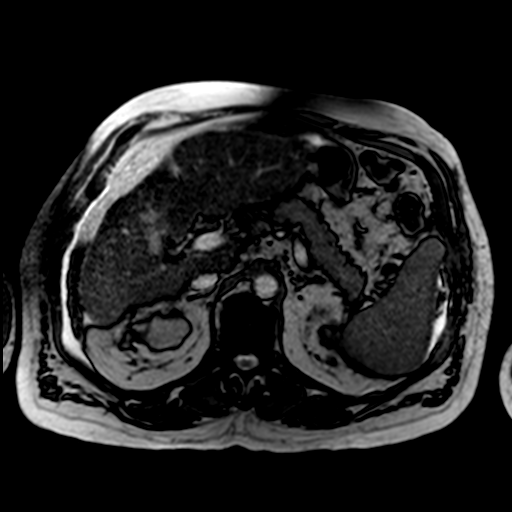
[im 70/70]
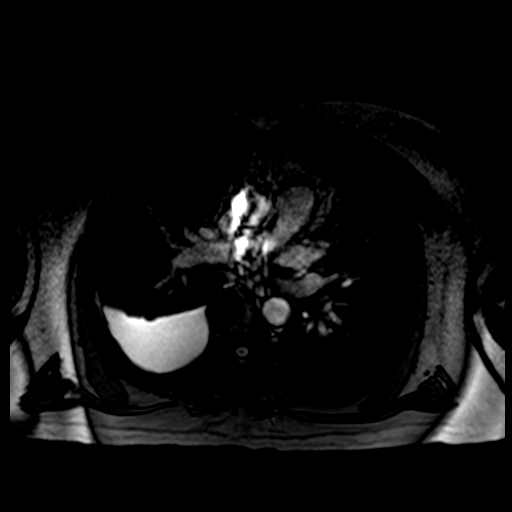

[Series 17: t1fs coronal post · coronal · 2.5mm · 1.48mm/px · 4 of 104 slices shown]
[im 1/104]
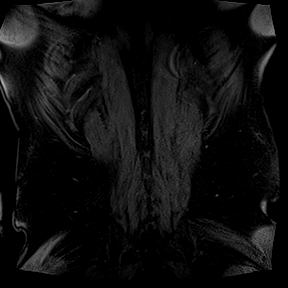
[im 35/104]
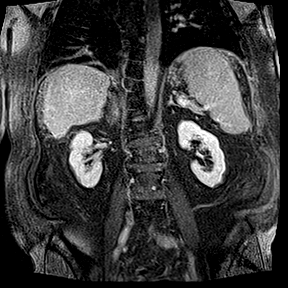
[im 69/104]
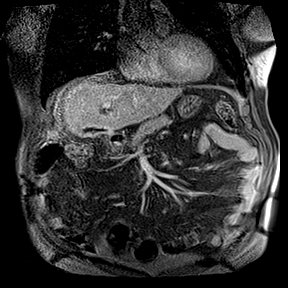
[im 104/104]
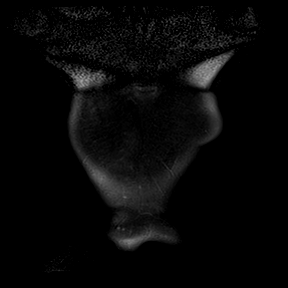

[Series 18: T2 · axial · 5.0mm · 1.62mm/px · z∈[-93,+141]mm · 2 of 40 slices shown (2 of 2)]
[im 1/40]
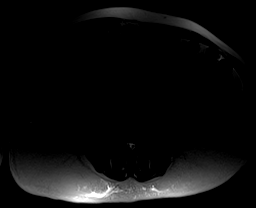
[im 40/40]
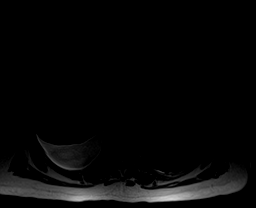

[Series 19: 5 min delay · axial · delayed · 3.5mm · 0.66mm/px · z∈[-100,+148]mm · 3 of 72 slices shown]
[im 1/72]
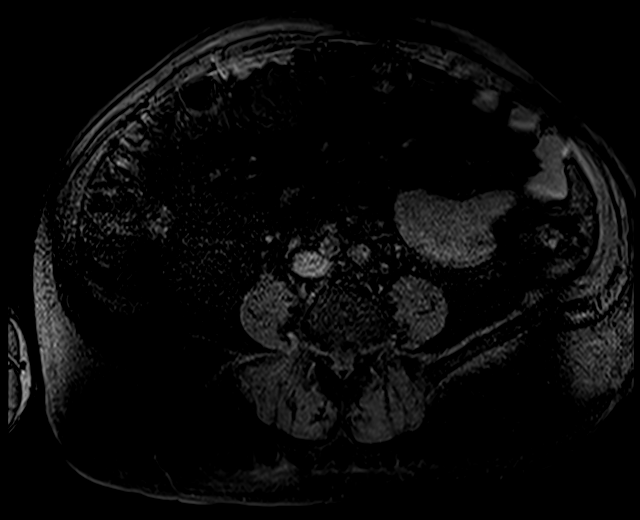
[im 36/72]
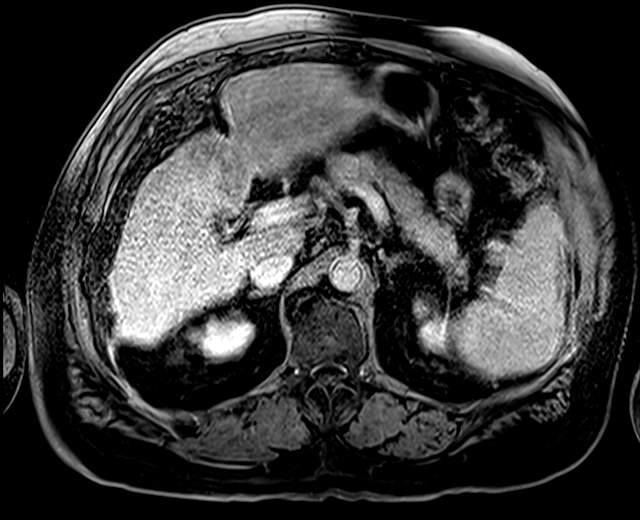
[im 72/72]
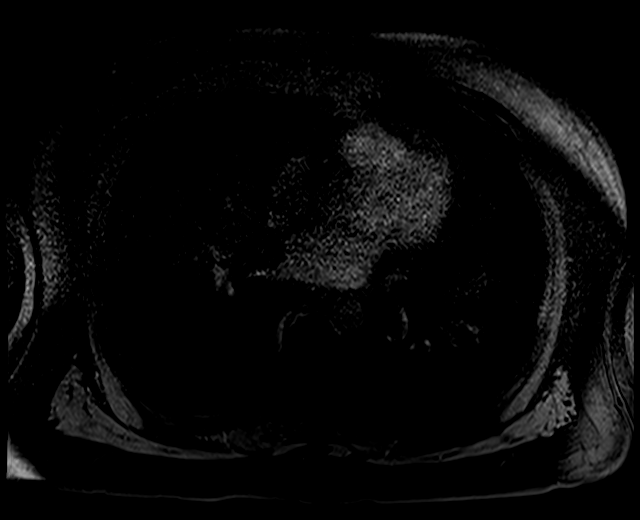

[24 of 48 positions shown; findings below may reference images not displayed]

FINDINGS: Lower chest: Moderate right pleural effusion lying dependently.

Hepatobiliary: Liver has a shrunken appearance and nodular contour,
indicative of advanced cirrhosis. There is a lace-like pattern of T2
hyperintensity scattered throughout the liver which corresponds to
areas of delayed enhancement, compatible with areas of regions of
confluent hepatic fibrosis. In segment 2 of the liver there is a
x 1.0 cm T1 hypointense, mildly T2 hyperintense lesion which does
not demonstrate diffusion restriction which demonstrates diffuse
arterial phase hyperenhancement with no appreciable washout on
delayed imaging (delayed images seem to parallel signal intensity of
vascular structures), favored to represent a cavernous hemangioma.
No other suspicious appearing hepatic lesions. No intra or
extrahepatic biliary ductal dilatation. Small filling defects are
noted within the gallbladder of lying dependently, compatible with
cholelithiasis. Gallbladder wall does not appear thickened, but
there does appear to be some T2 hyperintensity adjacent to the
gallbladder, suggesting gallbladder wall edema. Percutaneous
cholecystostomy tube poorly demonstrated on today's MRI examination.
Signal void in the liver between the right portal vein and right
hepatic vein, compatible with indwelling TIPS.

Pancreas: No pancreatic mass. No pancreatic ductal dilatation. No
pancreatic or peripancreatic fluid or inflammatory changes.

Spleen: Spleen is mildly enlarged measuring 7.4 x 13.6 x 14.0 cm
(estimated splenic volume of 704 mL).

Adrenals/Urinary Tract: Bilateral kidneys and adrenal glands are
normal in appearance. No hydroureteronephrosis in the visualized
portions of the abdomen.

Stomach/Bowel: Visualized portions are unremarkable.

Vascular/Lymphatic: Aortic atherosclerosis, without evidence of
aneurysm in the abdominal vasculature. No lymphadenopathy noted in
the abdomen.

Other:  Small volume of ascites.

Musculoskeletal: No aggressive appearing osseous lesions are noted
in the visualized portions of the skeleton.
IMPRESSION: 1. There are changes of cirrhosis with areas of hepatic fibrosis, as
discussed above. The suspicious lesion in segment 2 of the liver has
imaging characteristics most compatible with a flash fill cavernous
hemangioma. Given the background of cirrhosis, follow-up MRI of the
abdomen with and without IV gadolinium is recommended in 3-6 months
to ensure the stability of this finding.
2. Moderate right pleural effusion lying dependently.
3. Small volume of ascites.
4. Splenomegaly.
5. Additional incidental findings, as above.

## 2020-05-24 ENCOUNTER — Ambulatory Visit: Payer: Medicaid Other | Admitting: Cardiovascular Disease

## 2020-07-05 DIAGNOSIS — R7889 Finding of other specified substances, not normally found in blood: Secondary | ICD-10-CM | POA: Diagnosis not present

## 2020-07-05 DIAGNOSIS — E1139 Type 2 diabetes mellitus with other diabetic ophthalmic complication: Secondary | ICD-10-CM | POA: Diagnosis not present

## 2020-07-05 DIAGNOSIS — K7460 Unspecified cirrhosis of liver: Secondary | ICD-10-CM | POA: Diagnosis not present

## 2020-07-05 DIAGNOSIS — I10 Essential (primary) hypertension: Secondary | ICD-10-CM | POA: Diagnosis not present

## 2020-07-12 DIAGNOSIS — R7309 Other abnormal glucose: Secondary | ICD-10-CM | POA: Diagnosis not present

## 2020-07-12 DIAGNOSIS — D690 Allergic purpura: Secondary | ICD-10-CM | POA: Diagnosis not present

## 2020-07-12 DIAGNOSIS — E1122 Type 2 diabetes mellitus with diabetic chronic kidney disease: Secondary | ICD-10-CM | POA: Diagnosis not present

## 2020-07-12 DIAGNOSIS — K7469 Other cirrhosis of liver: Secondary | ICD-10-CM | POA: Diagnosis not present

## 2020-07-12 DIAGNOSIS — N1831 Chronic kidney disease, stage 3a: Secondary | ICD-10-CM | POA: Diagnosis not present

## 2020-07-12 DIAGNOSIS — Z23 Encounter for immunization: Secondary | ICD-10-CM | POA: Diagnosis not present

## 2020-07-20 ENCOUNTER — Other Ambulatory Visit: Payer: Self-pay

## 2020-07-20 ENCOUNTER — Ambulatory Visit (HOSPITAL_COMMUNITY)
Admission: RE | Admit: 2020-07-20 | Discharge: 2020-07-20 | Disposition: A | Discharge status: Home / Self Care | Payer: Medicaid Other | Source: Ambulatory Visit | Attending: Internal Medicine | Admitting: Internal Medicine

## 2020-07-20 DIAGNOSIS — K7460 Unspecified cirrhosis of liver: Secondary | ICD-10-CM | POA: Diagnosis not present

## 2020-07-20 DIAGNOSIS — K8020 Calculus of gallbladder without cholecystitis without obstruction: Secondary | ICD-10-CM | POA: Diagnosis not present

## 2020-08-15 ENCOUNTER — Encounter (INDEPENDENT_AMBULATORY_CARE_PROVIDER_SITE_OTHER): Payer: Medicaid Other | Admitting: Ophthalmology

## 2020-08-15 ENCOUNTER — Other Ambulatory Visit: Payer: Self-pay

## 2020-08-15 DIAGNOSIS — E113593 Type 2 diabetes mellitus with proliferative diabetic retinopathy without macular edema, bilateral: Secondary | ICD-10-CM

## 2020-08-15 DIAGNOSIS — H35033 Hypertensive retinopathy, bilateral: Secondary | ICD-10-CM

## 2020-08-15 DIAGNOSIS — I10 Essential (primary) hypertension: Secondary | ICD-10-CM

## 2020-08-15 DIAGNOSIS — H43813 Vitreous degeneration, bilateral: Secondary | ICD-10-CM

## 2020-08-15 DIAGNOSIS — H35372 Puckering of macula, left eye: Secondary | ICD-10-CM | POA: Diagnosis not present

## 2020-08-26 ENCOUNTER — Other Ambulatory Visit (HOSPITAL_COMMUNITY): Payer: Self-pay | Admitting: Internal Medicine

## 2020-08-26 DIAGNOSIS — R0989 Other specified symptoms and signs involving the circulatory and respiratory systems: Secondary | ICD-10-CM

## 2020-09-01 ENCOUNTER — Other Ambulatory Visit: Payer: Self-pay

## 2020-09-01 ENCOUNTER — Ambulatory Visit (HOSPITAL_COMMUNITY)
Admission: RE | Admit: 2020-09-01 | Discharge: 2020-09-01 | Disposition: A | Discharge status: Home / Self Care | Payer: Medicaid Other | Source: Ambulatory Visit | Attending: Internal Medicine | Admitting: Internal Medicine

## 2020-09-01 DIAGNOSIS — R0989 Other specified symptoms and signs involving the circulatory and respiratory systems: Secondary | ICD-10-CM | POA: Diagnosis not present

## 2020-09-01 DIAGNOSIS — I6523 Occlusion and stenosis of bilateral carotid arteries: Secondary | ICD-10-CM | POA: Diagnosis not present

## 2020-09-16 DIAGNOSIS — Z23 Encounter for immunization: Secondary | ICD-10-CM | POA: Diagnosis not present

## 2020-10-03 DIAGNOSIS — K7460 Unspecified cirrhosis of liver: Secondary | ICD-10-CM | POA: Diagnosis not present

## 2020-10-03 DIAGNOSIS — N183 Chronic kidney disease, stage 3 unspecified: Secondary | ICD-10-CM | POA: Diagnosis not present

## 2020-10-03 DIAGNOSIS — E1129 Type 2 diabetes mellitus with other diabetic kidney complication: Secondary | ICD-10-CM | POA: Diagnosis not present

## 2020-10-03 DIAGNOSIS — Z79899 Other long term (current) drug therapy: Secondary | ICD-10-CM | POA: Diagnosis not present

## 2020-10-03 DIAGNOSIS — D696 Thrombocytopenia, unspecified: Secondary | ICD-10-CM | POA: Diagnosis not present

## 2020-10-10 DIAGNOSIS — N1831 Chronic kidney disease, stage 3a: Secondary | ICD-10-CM | POA: Diagnosis not present

## 2020-10-10 DIAGNOSIS — D696 Thrombocytopenia, unspecified: Secondary | ICD-10-CM | POA: Diagnosis not present

## 2020-10-10 DIAGNOSIS — R7309 Other abnormal glucose: Secondary | ICD-10-CM | POA: Diagnosis not present

## 2020-10-10 DIAGNOSIS — I10 Essential (primary) hypertension: Secondary | ICD-10-CM | POA: Diagnosis not present

## 2020-10-10 DIAGNOSIS — K7469 Other cirrhosis of liver: Secondary | ICD-10-CM | POA: Diagnosis not present

## 2020-10-10 DIAGNOSIS — E1122 Type 2 diabetes mellitus with diabetic chronic kidney disease: Secondary | ICD-10-CM | POA: Diagnosis not present

## 2020-12-11 ENCOUNTER — Other Ambulatory Visit: Payer: Self-pay

## 2020-12-11 ENCOUNTER — Ambulatory Visit
Admission: EM | Admit: 2020-12-11 | Discharge: 2020-12-11 | Disposition: A | Discharge status: Home / Self Care | Payer: Medicaid Other | Attending: Family Medicine | Admitting: Family Medicine

## 2020-12-11 ENCOUNTER — Encounter: Payer: Self-pay | Admitting: Emergency Medicine

## 2020-12-11 DIAGNOSIS — S0501XA Injury of conjunctiva and corneal abrasion without foreign body, right eye, initial encounter: Secondary | ICD-10-CM

## 2020-12-11 MED ORDER — ERYTHROMYCIN 5 MG/GM OP OINT: TOPICAL_OINTMENT | OPHTHALMIC | 0 refills | Status: DC

## 2020-12-11 NOTE — ED Provider Notes (Signed)
[facility]    CSN: [id number] Arrival date & time: 12/11/20  1041      History   Chief Complaint No chief complaint on file.   HPI [name] [name] is a 65 y.o. male.   HPI Patient presents today with right eye irritation, reports two days ago he had saw dust fly into his eye. He immediately flushed the eye and retrieved remnants of saw dust following eye irrigation. He has had eye irritation redness since that time. Denies any visual acuity changes  Past Medical History:  Diagnosis Date  . Anemia   . Cryptogenic cirrhosis (HCC)   . Diabetes mellitus   . GERD (gastroesophageal reflux disease)   . Hypertension   . Liver disease     Patient Active Problem List   Diagnosis Date Noted  . Acute cholecystitis 09/25/2018  . Tobacco use 09/25/2018  . Hypophosphatemia 09/25/2018  . Hypertension 09/25/2018  . Calculus of gallbladder with acute cholecystitis and obstruction   . Cryptogenic cirrhosis (HCC) 10/23/2011  . DM (diabetes mellitus) (HCC) 10/23/2011  . GERD (gastroesophageal reflux disease) 10/23/2011  . 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 COLONOSCOPY    . INCISION AND DRAINAGE OF WOUND Right 12/28/2016   Procedure: IRRIGATION AND DEBRIDEMENT WOUND;  Surgeon: Betha Loa, MD;  Location: [facility];  Service: Orthopedics;  Laterality: Right;  . IR RADIOLOGIST EVAL & MGMT  10/08/2018  . LIVER BIOPSY    . NERVE, TENDON AND ARTERY REPAIR Right 12/28/2016   Procedure: NERVE, TENDON AND ARTERY REPAIR;  Surgeon: Betha Loa, MD;  Location: [facility];  Service: Orthopedics;  Laterality: Right;  . PERCUTANEOUS PINNING Right 12/28/2016   Procedure: PERCUTANEOUS PINNING EXTREMITY;  Surgeon: Betha Loa, MD;  Location: [facility];  Service: Orthopedics;  Laterality: Right;  . TIPS PROCEDURE  2008  . UPPER GASTROINTESTINAL ENDOSCOPY          Home Medications    Prior to Admission medications   Medication Sig Start Date End Date Taking? Authorizing Provider  amLODipine (NORVASC) 10 MG tablet Take 1 tablet (10 mg total) by mouth at bedtime. 11/30/19   Netta Neat., NP  aspirin EC 81 MG tablet Take 81 mg by mouth daily.    [provider]  carvedilol (COREG) 6.25 MG tablet Take 6.25 mg by mouth 2 (two) times daily with a meal.    [provider]  cloNIDine (CATAPRES) 0.3 MG tablet Take 0.3 mg by mouth 3 (three) times daily. 08/14/19   [provider]  Cyanocobalamin (VITAMIN B-12 IJ) Inject 1 mL as directed every 30 (thirty) days.     [provider]  furosemide (LASIX) 20 MG tablet Take 20 mg by mouth every other day.    [provider]  glimepiride (AMARYL) 4 MG tablet Take 4 mg by mouth daily before breakfast.    [provider]  LANTUS SOLOSTAR 100 UNIT/ML Solostar Pen Inject 30 Units into the skin daily. 08/03/19   [provider]  losartan (COZAAR) 100 MG tablet Take 100 mg by mouth daily. 08/14/19   [provider]  omeprazole (PRILOSEC) 20 MG capsule TAKE 1 CAPSULE BY MOUTH EVERY MORNING FOR ACID REFLUX. 07/05/14   Rehman, Joline Maxcy, MD  zolpidem (AMBIEN) 5 MG tablet Take 2.5-5 mg by mouth at bedtime as needed. 06/12/19   [provider]    Family History  Family History  Problem Relation Age of Onset  . Healthy Mother   . Diabetes Father   . Heart disease Father   . Healthy Sister   . Healthy Sister     Social History Social History   Tobacco Use  . Smoking status: Current Every Day Smoker    Packs/day: 1.00    Years: 40.00    Pack years: 40.00    Types: Cigarettes    Start date: 01/23/1981  . Smokeless tobacco: Never Used  . Tobacco comment: patient states that in the 40 years he has stopped smoking then start again  Vaping Use  . Vaping Use: Never used  Substance Use Topics  . Alcohol use: No    Alcohol/week: 0.0  standard drinks  . Drug use: No     Allergies   Patient has no known allergies.   Review of Systems Review of Systems Pertinent negatives listed in HPI   Physical Exam Triage Vital Signs ED Triage Vitals  Enc Vitals Group     BP 12/11/20 1046 (!) 163/70     Pulse Rate 12/11/20 1046 (!) 56     Resp 12/11/20 1046 18     Temp 12/11/20 1046 98 F (36.7 C)     Temp Source 12/11/20 1046 Oral     SpO2 12/11/20 1046 96 %     Weight --      Height --      Head Circumference --      Peak Flow --      Pain Score 12/11/20 1050 0     Pain Loc --      Pain Edu? --      Excl. in GC? --    No data found.  Updated Vital Signs BP (!) 163/70   Pulse (!) 56   Temp 98 F (36.7 C) (Oral)   Resp 18   SpO2 96%   Visual Acuity Right Eye Distance:   Left Eye Distance:   Bilateral Distance:    Right Eye Near:   Left Eye Near:    Bilateral Near:     Physical Exam Constitutional:      Appearance: Normal appearance.  HENT:     Head: Normocephalic and atraumatic.  Eyes:     Pupils: Pupils are equal, round, and reactive to light.     Right eye: Corneal abrasion and fluorescein uptake present.   Cardiovascular:     Rate and Rhythm: Normal rate and regular rhythm.  Pulmonary:     Effort: Pulmonary effort is normal.     Breath sounds: Normal breath sounds.  Skin:    General: Skin is warm.     Capillary Refill: Capillary refill takes less than 2 seconds.  Neurological:     General: No focal deficit present.     Mental Status: He is alert and oriented to person, place, and time.  Psychiatric:        Behavior: Behavior is uncooperative.      UC Treatments / Results  Labs (all labs ordered are listed, but only abnormal results are displayed) Labs Reviewed - No data to display  EKG   Radiology No results found.  Procedures Procedures (including critical care time)  Medications Ordered in UC Medications - No data to display  Initial Impression / Assessment and  Plan / UC Course  I have reviewed the triage vital signs and the nursing notes.  Pertinent labs & imaging results that were available during my care of the  patient were reviewed by me and considered in my medical decision making (see chart for details).     Corneal inversion affecting the right, no foreign body retrieve foreign seen exam.  Treating with erythromycin for 5 days.  Worsening of symptoms develop patient also follow-up with ophthalmologist.  Final Clinical Impressions(s) / UC Diagnoses   Final diagnoses:  Corneal abrasion, right, initial encounter   Discharge Instructions   None    ED Prescriptions    Medication Sig Dispense Auth. Provider   erythromycin ophthalmic ointment Place a 1/2 inch ribbon of ointment into the lower eyelid right eye x 5 days at bedtime 3.5 g Bing Neighbors, FNP     PDMP not reviewed this encounter.   Bing Neighbors, FNP 12/11/20 1130

## 2020-12-11 NOTE — ED Triage Notes (Signed)
Saw dust in RT eye x 3 days

## 2021-02-13 ENCOUNTER — Other Ambulatory Visit: Payer: Self-pay

## 2021-02-13 ENCOUNTER — Encounter (INDEPENDENT_AMBULATORY_CARE_PROVIDER_SITE_OTHER): Payer: Medicare Other | Admitting: Ophthalmology

## 2021-02-13 DIAGNOSIS — I10 Essential (primary) hypertension: Secondary | ICD-10-CM | POA: Diagnosis not present

## 2021-02-13 DIAGNOSIS — H35033 Hypertensive retinopathy, bilateral: Secondary | ICD-10-CM | POA: Diagnosis not present

## 2021-02-13 DIAGNOSIS — H43813 Vitreous degeneration, bilateral: Secondary | ICD-10-CM

## 2021-02-13 DIAGNOSIS — E113511 Type 2 diabetes mellitus with proliferative diabetic retinopathy with macular edema, right eye: Secondary | ICD-10-CM | POA: Diagnosis not present

## 2021-02-13 DIAGNOSIS — E113592 Type 2 diabetes mellitus with proliferative diabetic retinopathy without macular edema, left eye: Secondary | ICD-10-CM | POA: Diagnosis not present

## 2021-04-13 ENCOUNTER — Other Ambulatory Visit: Payer: Self-pay | Admitting: Internal Medicine

## 2021-04-13 ENCOUNTER — Other Ambulatory Visit (HOSPITAL_COMMUNITY): Payer: Self-pay | Admitting: Internal Medicine

## 2021-04-13 DIAGNOSIS — Z1289 Encounter for screening for malignant neoplasm of other sites: Secondary | ICD-10-CM

## 2021-04-21 ENCOUNTER — Ambulatory Visit (HOSPITAL_COMMUNITY)
Admission: RE | Admit: 2021-04-21 | Discharge: 2021-04-21 | Disposition: A | Discharge status: Home / Self Care | Payer: Medicare Other | Source: Ambulatory Visit | Attending: Internal Medicine | Admitting: Internal Medicine

## 2021-04-21 ENCOUNTER — Other Ambulatory Visit: Payer: Self-pay

## 2021-04-21 DIAGNOSIS — Z1289 Encounter for screening for malignant neoplasm of other sites: Secondary | ICD-10-CM | POA: Diagnosis not present

## 2021-08-16 ENCOUNTER — Other Ambulatory Visit: Payer: Self-pay

## 2021-08-16 ENCOUNTER — Encounter (INDEPENDENT_AMBULATORY_CARE_PROVIDER_SITE_OTHER): Payer: Medicare Other | Admitting: Ophthalmology

## 2021-08-16 DIAGNOSIS — H43813 Vitreous degeneration, bilateral: Secondary | ICD-10-CM

## 2021-08-16 DIAGNOSIS — H35033 Hypertensive retinopathy, bilateral: Secondary | ICD-10-CM

## 2021-08-16 DIAGNOSIS — I10 Essential (primary) hypertension: Secondary | ICD-10-CM

## 2021-08-16 DIAGNOSIS — E113513 Type 2 diabetes mellitus with proliferative diabetic retinopathy with macular edema, bilateral: Secondary | ICD-10-CM

## 2021-08-31 ENCOUNTER — Other Ambulatory Visit (HOSPITAL_COMMUNITY): Payer: Self-pay | Admitting: Internal Medicine

## 2021-08-31 ENCOUNTER — Other Ambulatory Visit: Payer: Self-pay | Admitting: Internal Medicine

## 2021-08-31 DIAGNOSIS — R0989 Other specified symptoms and signs involving the circulatory and respiratory systems: Secondary | ICD-10-CM

## 2021-09-07 ENCOUNTER — Other Ambulatory Visit: Payer: Self-pay

## 2021-09-07 ENCOUNTER — Ambulatory Visit (HOSPITAL_COMMUNITY)
Admission: RE | Admit: 2021-09-07 | Discharge: 2021-09-07 | Disposition: A | Discharge status: Home / Self Care | Payer: Medicare Other | Source: Ambulatory Visit | Attending: Internal Medicine | Admitting: Internal Medicine

## 2021-09-07 DIAGNOSIS — R0989 Other specified symptoms and signs involving the circulatory and respiratory systems: Secondary | ICD-10-CM | POA: Diagnosis present

## 2022-02-07 DIAGNOSIS — L97919 Non-pressure chronic ulcer of unspecified part of right lower leg with unspecified severity: Secondary | ICD-10-CM | POA: Insufficient documentation

## 2022-02-14 ENCOUNTER — Encounter (INDEPENDENT_AMBULATORY_CARE_PROVIDER_SITE_OTHER): Payer: Medicare Other | Admitting: Ophthalmology

## 2022-02-14 DIAGNOSIS — I10 Essential (primary) hypertension: Secondary | ICD-10-CM

## 2022-02-14 DIAGNOSIS — H35033 Hypertensive retinopathy, bilateral: Secondary | ICD-10-CM

## 2022-02-14 DIAGNOSIS — E113512 Type 2 diabetes mellitus with proliferative diabetic retinopathy with macular edema, left eye: Secondary | ICD-10-CM

## 2022-02-14 DIAGNOSIS — E113591 Type 2 diabetes mellitus with proliferative diabetic retinopathy without macular edema, right eye: Secondary | ICD-10-CM | POA: Diagnosis not present

## 2022-02-14 DIAGNOSIS — H43813 Vitreous degeneration, bilateral: Secondary | ICD-10-CM

## 2022-03-07 DIAGNOSIS — I890 Lymphedema, not elsewhere classified: Secondary | ICD-10-CM | POA: Insufficient documentation

## 2022-03-20 ENCOUNTER — Encounter (INDEPENDENT_AMBULATORY_CARE_PROVIDER_SITE_OTHER): Payer: Medicare Other | Admitting: Ophthalmology

## 2022-03-20 DIAGNOSIS — I10 Essential (primary) hypertension: Secondary | ICD-10-CM | POA: Diagnosis not present

## 2022-03-20 DIAGNOSIS — H35033 Hypertensive retinopathy, bilateral: Secondary | ICD-10-CM | POA: Diagnosis not present

## 2022-03-20 DIAGNOSIS — E113591 Type 2 diabetes mellitus with proliferative diabetic retinopathy without macular edema, right eye: Secondary | ICD-10-CM

## 2022-03-20 DIAGNOSIS — E113512 Type 2 diabetes mellitus with proliferative diabetic retinopathy with macular edema, left eye: Secondary | ICD-10-CM | POA: Diagnosis not present

## 2022-03-20 DIAGNOSIS — H43813 Vitreous degeneration, bilateral: Secondary | ICD-10-CM

## 2022-04-17 ENCOUNTER — Encounter (INDEPENDENT_AMBULATORY_CARE_PROVIDER_SITE_OTHER): Payer: Medicare Other | Admitting: Ophthalmology

## 2022-04-17 DIAGNOSIS — H35033 Hypertensive retinopathy, bilateral: Secondary | ICD-10-CM

## 2022-04-17 DIAGNOSIS — E113591 Type 2 diabetes mellitus with proliferative diabetic retinopathy without macular edema, right eye: Secondary | ICD-10-CM

## 2022-04-17 DIAGNOSIS — H43813 Vitreous degeneration, bilateral: Secondary | ICD-10-CM

## 2022-04-17 DIAGNOSIS — I10 Essential (primary) hypertension: Secondary | ICD-10-CM | POA: Diagnosis not present

## 2022-04-17 DIAGNOSIS — E113512 Type 2 diabetes mellitus with proliferative diabetic retinopathy with macular edema, left eye: Secondary | ICD-10-CM

## 2022-04-26 ENCOUNTER — Other Ambulatory Visit (HOSPITAL_COMMUNITY): Payer: Self-pay | Admitting: Internal Medicine

## 2022-04-26 ENCOUNTER — Other Ambulatory Visit: Payer: Self-pay | Admitting: Internal Medicine

## 2022-04-26 DIAGNOSIS — K7460 Unspecified cirrhosis of liver: Secondary | ICD-10-CM

## 2022-05-02 ENCOUNTER — Ambulatory Visit (HOSPITAL_COMMUNITY): Payer: Medicare Other

## 2022-05-02 DIAGNOSIS — S81802S Unspecified open wound, left lower leg, sequela: Secondary | ICD-10-CM | POA: Insufficient documentation

## 2022-05-07 ENCOUNTER — Ambulatory Visit (HOSPITAL_COMMUNITY)
Admission: RE | Admit: 2022-05-07 | Discharge: 2022-05-07 | Disposition: A | Discharge status: Home / Self Care | Payer: Medicare Other | Source: Ambulatory Visit | Attending: Internal Medicine | Admitting: Internal Medicine

## 2022-05-07 DIAGNOSIS — K7460 Unspecified cirrhosis of liver: Secondary | ICD-10-CM | POA: Diagnosis not present

## 2022-05-15 ENCOUNTER — Encounter (INDEPENDENT_AMBULATORY_CARE_PROVIDER_SITE_OTHER): Payer: Medicare Other | Admitting: Ophthalmology

## 2022-05-15 ENCOUNTER — Other Ambulatory Visit: Payer: Self-pay

## 2022-05-15 DIAGNOSIS — E113591 Type 2 diabetes mellitus with proliferative diabetic retinopathy without macular edema, right eye: Secondary | ICD-10-CM | POA: Diagnosis not present

## 2022-05-15 DIAGNOSIS — E113512 Type 2 diabetes mellitus with proliferative diabetic retinopathy with macular edema, left eye: Secondary | ICD-10-CM

## 2022-05-15 DIAGNOSIS — H43813 Vitreous degeneration, bilateral: Secondary | ICD-10-CM

## 2022-05-15 DIAGNOSIS — H35033 Hypertensive retinopathy, bilateral: Secondary | ICD-10-CM

## 2022-05-15 DIAGNOSIS — I739 Peripheral vascular disease, unspecified: Secondary | ICD-10-CM

## 2022-05-15 DIAGNOSIS — I10 Essential (primary) hypertension: Secondary | ICD-10-CM

## 2022-05-16 ENCOUNTER — Ambulatory Visit (INDEPENDENT_AMBULATORY_CARE_PROVIDER_SITE_OTHER): Payer: Medicare Other

## 2022-05-16 ENCOUNTER — Ambulatory Visit (INDEPENDENT_AMBULATORY_CARE_PROVIDER_SITE_OTHER): Payer: Medicare Other | Admitting: Vascular Surgery

## 2022-05-16 ENCOUNTER — Encounter: Payer: Self-pay | Admitting: Vascular Surgery

## 2022-05-16 VITALS — BP 155/67 | HR 49 | Temp 98.2°F | Ht 70.5 in | Wt 243.0 lb

## 2022-05-16 DIAGNOSIS — I878 Other specified disorders of veins: Secondary | ICD-10-CM

## 2022-05-16 DIAGNOSIS — I739 Peripheral vascular disease, unspecified: Secondary | ICD-10-CM

## 2022-05-16 NOTE — Progress Notes (Signed)
[facility] [place]  Patient name: [name] [name] MRN: [id number] DOB: [birth year] Sex: male  REASON FOR CONSULT: Evaluation lower extremity wounds rule out arterial insufficiency  HPI: [name] [name] is a 66 y.o. male, who is here today for evaluation of lower extremity wounds.  He is here with his sister who helps with his wound care.  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  This was in April.  He has had continued nonhealing of this area.  He subsequently developed ulceration on the posterior aspect of his left leg above the Achilles tendon.  He has been treated at the [facility] wound center.  He has had appropriate treatment with compression with Ace wrap throughout his treatment.  He reports that he was unable to use a prescribed silver wafer dressing due to insurance cost.  Have a history of diabetes.  He has a history of cryptogenic cirrhosis undergoing a TIPS procedure in 2007 at [facility].  He reports that he had a great deal of swelling prior to that time and had multiple episodes of paracentesis.  He has had no further difficulty following TIPS procedure.  He does report occasional swelling in both lower extremities.  No history of DVT  Past Medical History:  Diagnosis Date   Anemia    Cryptogenic cirrhosis (HCC)    Diabetes mellitus    GERD (gastroesophageal reflux disease)    Hypertension    Liver disease     Family History  Problem Relation Age of Onset   Healthy Mother    Diabetes Father    Heart disease Father    Healthy Sister    Healthy Sister     SOCIAL HISTORY: Social History   Socioeconomic History   Marital status: Single    Spouse name: Not on file   Number of children: Not on file   Years of education: Not on file   Highest education level: Not on file  Occupational History   Not on file  Tobacco Use   Smoking status: Every Day     Packs/day: 1.00    Years: 40.00    Total pack years: 40.00    Types: Cigarettes    Start date: 01/23/1981   Smokeless tobacco: Never   Tobacco comments:    patient states that in the 40 years he has stopped smoking then start again  Vaping Use   Vaping Use: Never used  Substance and Sexual Activity   Alcohol use: No    Alcohol/week: 0.0 standard drinks of alcohol   Drug use: No   Sexual activity: Not on file  Other Topics Concern   Not on file  Social History Narrative   Not on file   Social Determinants of Health   Financial Resource Strain: Not on file  Food Insecurity: Not on file  Transportation Needs: Not on file  Physical Activity: Not on file  Stress: Not on file  Social Connections: Not on file  Intimate Partner Violence: Not on file    No Known Allergies  Current Outpatient Medications  Medication Sig Dispense Refill   amLODipine (NORVASC) 10 MG tablet Take 1 tablet (10 mg total) by mouth at bedtime. 30 tablet 6   aspirin EC 81 MG tablet Take 81 mg by mouth daily.     carvedilol (COREG) 6.25 MG tablet Take 6.25 mg by mouth 2 (two) times daily with a meal.  cloNIDine (CATAPRES) 0.3 MG tablet Take 0.3 mg by mouth 3 (three) times daily.     Cyanocobalamin (VITAMIN B-12 IJ) Inject 1 mL as directed every 30 (thirty) days.      erythromycin ophthalmic ointment Place a 1/2 inch ribbon of ointment into the lower eyelid right eye x 5 days at bedtime 3.5 g 0   furosemide (LASIX) 20 MG tablet Take 20 mg by mouth every other day.     glimepiride (AMARYL) 4 MG tablet Take 4 mg by mouth daily before breakfast.     LANTUS SOLOSTAR 100 UNIT/ML Solostar Pen Inject 30 Units into the skin daily.     losartan (COZAAR) 100 MG tablet Take 100 mg by mouth daily.     omeprazole (PRILOSEC) 20 MG capsule TAKE 1 CAPSULE BY MOUTH EVERY MORNING FOR ACID REFLUX. 30 capsule 5   zolpidem (AMBIEN) 5 MG tablet Take 2.5-5 mg by mouth at bedtime as needed.     No current facility-administered  medications for this visit.    REVIEW OF SYSTEMS:  [X]  denotes positive finding, [ ]  denotes negative finding Cardiac  Comments:  Chest pain or chest pressure:    Shortness of breath upon exertion:    Short of breath when lying flat:    Irregular heart rhythm:        Vascular    Pain in calf, thigh, or hip brought on by ambulation:    Pain in feet at night that wakes you up from your sleep:     Blood clot in your veins:    Leg swelling:  xx       Pulmonary    Oxygen at home:    Productive cough:     Wheezing:         Neurologic    Sudden weakness in arms or legs:     Sudden numbness in arms or legs:     Sudden onset of difficulty speaking or slurred speech:    Temporary loss of vision in one eye:     Problems with dizziness:         Gastrointestinal    Blood in stool:     Vomited blood:         Genitourinary    Burning when urinating:     Blood in urine:        Psychiatric    Major depression:         Hematologic    Bleeding problems:    Problems with blood clotting too easily:        Skin    Rashes or ulcers: x       Constitutional    Fever or chills:      PHYSICAL EXAM: Vitals:   05/16/22 0905  BP: (!) 155/67  Pulse: (!) 49  Temp: 98.2 F (36.8 C)  SpO2: 98%  Weight: 243 lb (110.2 kg)  Height: 5' 10.5" (1.791 m)    GENERAL: The patient is a well-nourished male, in no acute distress. The vital signs are documented above. CARDIOVASCULAR: 2+ radial and 2+ dorsalis pedis pulses bilaterally PULMONARY: There is good air exchange  MUSCULOSKELETAL: There are no major deformities or cyanosis. NEUROLOGIC: No focal weakness or paresthesias are detected. SKIN: He does have several ulcerations over his right pretibial area.  No surrounding erythema.  Minimal swelling today.  He does have approximately 3 cm ulceration over the posterior left distal calf PSYCHIATRIC: The patient has a normal affect.  DATA:  Noninvasive studies in  our office today reveal  normal ankle arm index and normal phasic waveforms bilaterally  He underwent venous duplex at [facility] July 2023 showing no evidence of DVT  MEDICAL ISSUES: Discussed these findings with patient and his sister present. He has no evidence of arterial insufficiency that would slow his healing.  This does appear to be related to venous stasis ulceration.  I did discuss the very slow process of healing.  I explained the critical importance of lifelong compression once healed to reduce risk for recurrence.  He will continue his follow-up at the [facility] wound center.  I did discuss the possible use of Silvadene ointment due to insurance issues regarding the silver wafer treatment.  We will defer this to Dr.Straughan   Larina Earthly, MD [facility] [phone] Pager [phone]  Note: Portions of this report may have been transcribed using voice recognition software.  Every effort has been made to ensure accuracy; however, inadvertent computerized transcription errors may still be present.

## 2022-06-19 ENCOUNTER — Encounter (INDEPENDENT_AMBULATORY_CARE_PROVIDER_SITE_OTHER): Payer: Medicare Other | Admitting: Ophthalmology

## 2022-06-19 DIAGNOSIS — E113512 Type 2 diabetes mellitus with proliferative diabetic retinopathy with macular edema, left eye: Secondary | ICD-10-CM

## 2022-06-19 DIAGNOSIS — I10 Essential (primary) hypertension: Secondary | ICD-10-CM | POA: Diagnosis not present

## 2022-06-19 DIAGNOSIS — E113591 Type 2 diabetes mellitus with proliferative diabetic retinopathy without macular edema, right eye: Secondary | ICD-10-CM

## 2022-06-19 DIAGNOSIS — H35033 Hypertensive retinopathy, bilateral: Secondary | ICD-10-CM

## 2022-06-19 DIAGNOSIS — H43813 Vitreous degeneration, bilateral: Secondary | ICD-10-CM

## 2022-08-07 ENCOUNTER — Encounter (INDEPENDENT_AMBULATORY_CARE_PROVIDER_SITE_OTHER): Payer: Medicare Other | Admitting: Ophthalmology

## 2022-08-07 DIAGNOSIS — I10 Essential (primary) hypertension: Secondary | ICD-10-CM

## 2022-08-07 DIAGNOSIS — H35033 Hypertensive retinopathy, bilateral: Secondary | ICD-10-CM | POA: Diagnosis not present

## 2022-08-07 DIAGNOSIS — E113513 Type 2 diabetes mellitus with proliferative diabetic retinopathy with macular edema, bilateral: Secondary | ICD-10-CM

## 2022-08-07 DIAGNOSIS — H43813 Vitreous degeneration, bilateral: Secondary | ICD-10-CM | POA: Diagnosis not present

## 2022-08-17 ENCOUNTER — Encounter (INDEPENDENT_AMBULATORY_CARE_PROVIDER_SITE_OTHER): Payer: Medicare Other | Admitting: Ophthalmology

## 2022-09-07 ENCOUNTER — Other Ambulatory Visit (HOSPITAL_COMMUNITY): Payer: Self-pay | Admitting: Internal Medicine

## 2022-09-07 DIAGNOSIS — R0989 Other specified symptoms and signs involving the circulatory and respiratory systems: Secondary | ICD-10-CM

## 2022-09-20 ENCOUNTER — Ambulatory Visit (HOSPITAL_COMMUNITY)
Admission: RE | Admit: 2022-09-20 | Discharge: 2022-09-20 | Disposition: A | Discharge status: Home / Self Care | Payer: Medicare Other | Source: Ambulatory Visit | Attending: Internal Medicine | Admitting: Internal Medicine

## 2022-09-20 DIAGNOSIS — R0989 Other specified symptoms and signs involving the circulatory and respiratory systems: Secondary | ICD-10-CM | POA: Diagnosis present

## 2022-10-01 ENCOUNTER — Encounter (INDEPENDENT_AMBULATORY_CARE_PROVIDER_SITE_OTHER): Payer: Medicare Other | Admitting: Ophthalmology

## 2022-10-01 DIAGNOSIS — E113591 Type 2 diabetes mellitus with proliferative diabetic retinopathy without macular edema, right eye: Secondary | ICD-10-CM

## 2022-10-01 DIAGNOSIS — H35033 Hypertensive retinopathy, bilateral: Secondary | ICD-10-CM | POA: Diagnosis not present

## 2022-10-01 DIAGNOSIS — E113512 Type 2 diabetes mellitus with proliferative diabetic retinopathy with macular edema, left eye: Secondary | ICD-10-CM | POA: Diagnosis not present

## 2022-10-01 DIAGNOSIS — I10 Essential (primary) hypertension: Secondary | ICD-10-CM | POA: Diagnosis not present

## 2022-10-01 DIAGNOSIS — H43813 Vitreous degeneration, bilateral: Secondary | ICD-10-CM

## 2022-10-23 DIAGNOSIS — D510 Vitamin B12 deficiency anemia due to intrinsic factor deficiency: Secondary | ICD-10-CM | POA: Insufficient documentation

## 2022-10-23 DIAGNOSIS — N183 Chronic kidney disease, stage 3 unspecified: Secondary | ICD-10-CM | POA: Insufficient documentation

## 2022-10-24 ENCOUNTER — Ambulatory Visit (INDEPENDENT_AMBULATORY_CARE_PROVIDER_SITE_OTHER): Payer: Medicare Other | Admitting: Vascular Surgery

## 2022-10-24 ENCOUNTER — Encounter: Payer: Self-pay | Admitting: Vascular Surgery

## 2022-10-24 VITALS — BP 149/68 | HR 51 | Temp 98.2°F | Ht 70.5 in | Wt 250.4 lb

## 2022-10-24 DIAGNOSIS — I6523 Occlusion and stenosis of bilateral carotid arteries: Secondary | ICD-10-CM | POA: Diagnosis not present

## 2022-10-24 DIAGNOSIS — N1831 Chronic kidney disease, stage 3a: Secondary | ICD-10-CM | POA: Diagnosis not present

## 2022-10-24 DIAGNOSIS — D510 Vitamin B12 deficiency anemia due to intrinsic factor deficiency: Secondary | ICD-10-CM

## 2022-10-24 NOTE — Progress Notes (Signed)
[facility]ist of [place]  Patient name: [name] [name] MRN: [id number] DOB: 1956/07/23 Sex: male  REASON FOR VISIT: Discuss carotid stenoses  HPI: [name] [name] is a 67 y.o. male here today for follow-up.  I saw him initially in October 2023 with venous stasis disease.  He continues to have improvement with local wound care.  He reports that he has had more improvement recently with the addition of the antibiotic spray.  He is continuing to wrap his legs with Ace wrap and I explained the critical importance of lifelong compression even after healing.  He has known asymptomatic carotid disease.  He recently underwent repeat carotid duplex surveillance is here for discussion of this.  He specifically denies any prior history of amaurosis fugax, transient ischemic attack or stroke.  Past Medical History:  Diagnosis Date   Anemia    Cryptogenic cirrhosis (HCC)    Diabetes mellitus    GERD (gastroesophageal reflux disease)    Hypertension    Liver disease     Family History  Problem Relation Age of Onset   Healthy Mother    Diabetes Father    Heart disease Father    Healthy Sister    Healthy Sister     SOCIAL HISTORY: Social History   Tobacco Use   Smoking status: Every Day    Packs/day: 1.00    Years: 40.00    Additional pack years: 0.00    Total pack years: 40.00    Types: Cigarettes    Start date: 01/23/1981   Smokeless tobacco: Never   Tobacco comments:    patient states that in the 40 years he has stopped smoking then start again  Substance Use Topics   Alcohol use: No    Alcohol/week: 0.0 standard drinks of alcohol    No Known Allergies  Current Outpatient Medications  Medication Sig Dispense Refill   ACCU-CHEK AVIVA PLUS test strip 1 each by Other route as needed.     amLODipine (NORVASC) 10 MG tablet Take 1 tablet (10 mg total) by mouth at bedtime. 30 tablet 6   aspirin EC 81 MG tablet Take 81 mg by  mouth daily.     carvedilol (COREG) 6.25 MG tablet Take 6.25 mg by mouth 2 (two) times daily with a meal.     cloNIDine (CATAPRES) 0.3 MG tablet Take 0.3 mg by mouth 3 (three) times daily.     erythromycin ophthalmic ointment Place a 1/2 inch ribbon of ointment into the lower eyelid right eye x 5 days at bedtime 3.5 g 0   furosemide (LASIX) 40 MG tablet Take 40 mg by mouth daily.     glimepiride (AMARYL) 4 MG tablet Take 4 mg by mouth daily before breakfast.     LANTUS SOLOSTAR 100 UNIT/ML Solostar Pen Inject 30 Units into the skin daily.     losartan (COZAAR) 100 MG tablet Take 100 mg by mouth daily.     omeprazole (PRILOSEC) 20 MG capsule TAKE 1 CAPSULE BY MOUTH EVERY MORNING FOR ACID REFLUX. 30 capsule 5   SURE COMFORT PEN NEEDLES 31G X 8 MM MISC      TRESIBA FLEXTOUCH 100 UNIT/ML FlexTouch Pen Inject 32 Units into the skin daily.     zolpidem (AMBIEN) 5 MG tablet Take 2.5-5 mg by mouth at bedtime as needed.     Cyanocobalamin (VITAMIN B-12 IJ) Inject 1 mL as directed every 30 (thirty) days.  (Patient not taking: Reported on 10/24/2022)  No current facility-administered medications for this visit.    REVIEW OF SYSTEMS:  [X]  denotes positive finding, [ ]  denotes negative finding Cardiac  Comments:  Chest pain or chest pressure:    Shortness of breath upon exertion:    Short of breath when lying flat:    Irregular heart rhythm:        Vascular    Pain in calf, thigh, or hip brought on by ambulation:    Pain in feet at night that wakes you up from your sleep:     Blood clot in your veins:    Leg swelling:           PHYSICAL EXAM: Vitals:   10/24/22 1022 10/24/22 1025  BP: (!) 147/66 (!) 149/68  Pulse: (!) 51   Temp: 98.2 F (36.8 C)   SpO2: 98%   Weight: 250 lb 6.4 oz (113.6 kg)   Height: 5' 10.5" (1.791 m)     GENERAL: The patient is a well-nourished male, in no acute distress. The vital signs are documented above. CARDIOVASCULAR: Carotid arteries without bruits  bilaterally.  2+ radial pulses bilaterally. PULMONARY: There is good air exchange  MUSCULOSKELETAL: There are no major deformities or cyanosis. NEUROLOGIC: No focal weakness or paresthesias are detected. SKIN: There are no ulcers or rashes noted. PSYCHIATRIC: The patient has a normal affect.  DATA:  Duplex from [facility] on 09/20/2022 was reviewed with the patient.  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  Radiologist interpretation of this may be somewhat more involved on the left carotid.  His duplex velocity certainly put him in the midportion of this range.  MEDICAL ISSUES: I discussed this at length with the patient.  I explained that he is well below the threshold where we would recommend any intervention for severe asymptomatic disease.  I did review strokelike symptoms to him and asked that he report immediately to the emergency room should this occur.  I did explain that if he developed greater than 80% stenosis, we would recommend treatment.  He reports that a brother and 2 other friends had a stroke related to carotid surgery or stenting.  He is quite hesitant to proceed even if he reaches a high degree of stenosis.  I did "a 1-1 and half percent risk of stroke with carotid endarterectomy and unfortunately he has 3 acquaintances who have had a bad outcome.  He does wish to continue surveillance and would discuss potential treatment should he reach critical stenosis or have symptoms.  We will see him again in 1 year with repeat carotid duplex    Rosetta Posner, MD FACS [facility] [phone]  Note: Portions of this report may have been transcribed using voice recognition software.  Every effort has been made to ensure accuracy; however, inadvertent computerized transcription errors may still be present.

## 2022-12-03 ENCOUNTER — Encounter (INDEPENDENT_AMBULATORY_CARE_PROVIDER_SITE_OTHER): Payer: Medicare Other | Admitting: Ophthalmology

## 2022-12-03 DIAGNOSIS — E113512 Type 2 diabetes mellitus with proliferative diabetic retinopathy with macular edema, left eye: Secondary | ICD-10-CM | POA: Diagnosis not present

## 2022-12-03 DIAGNOSIS — I10 Essential (primary) hypertension: Secondary | ICD-10-CM | POA: Diagnosis not present

## 2022-12-03 DIAGNOSIS — H35033 Hypertensive retinopathy, bilateral: Secondary | ICD-10-CM | POA: Diagnosis not present

## 2022-12-03 DIAGNOSIS — H43813 Vitreous degeneration, bilateral: Secondary | ICD-10-CM

## 2022-12-03 DIAGNOSIS — E113591 Type 2 diabetes mellitus with proliferative diabetic retinopathy without macular edema, right eye: Secondary | ICD-10-CM | POA: Diagnosis not present

## 2022-12-03 DIAGNOSIS — Z794 Long term (current) use of insulin: Secondary | ICD-10-CM

## 2022-12-24 ENCOUNTER — Encounter (HOSPITAL_BASED_OUTPATIENT_CLINIC_OR_DEPARTMENT_OTHER): Payer: Medicare Other | Attending: Internal Medicine | Admitting: Internal Medicine

## 2022-12-24 DIAGNOSIS — E11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 other skin ulcer: Secondary | ICD-10-CM | POA: Diagnosis not present

## 2022-12-24 DIAGNOSIS — L97818 Non-pressure chronic ulcer of other part of right lower leg with other specified severity: Secondary | ICD-10-CM | POA: Diagnosis not present

## 2022-12-24 DIAGNOSIS — L97828 Non-pressure chronic ulcer of other part of left lower leg with other specified severity: Secondary | ICD-10-CM | POA: Diagnosis not present

## 2022-12-24 DIAGNOSIS — I890 Lymphedema, not elsewhere classified: Secondary | ICD-10-CM | POA: Insufficient documentation

## 2022-12-24 DIAGNOSIS — K7460 Unspecified cirrhosis of liver: Secondary | ICD-10-CM | POA: Insufficient documentation

## 2022-12-24 DIAGNOSIS — L97812 Non-pressure chronic ulcer of other part of right lower leg with fat layer exposed: Secondary | ICD-10-CM | POA: Insufficient documentation

## 2022-12-24 DIAGNOSIS — I87313 Chronic venous hypertension (idiopathic) with ulcer of bilateral lower extremity: Secondary | ICD-10-CM | POA: Insufficient documentation

## 2022-12-25 NOTE — Progress Notes (Signed)
[name] [name] ([id number]) [id number] Page 1 of 4 Visit Report for 12/24/2022 Abuse Risk Screen Details Patient Name: Date of Service: [name] [name]. 12/24/2022 10:30 A M Medical Record Number: [id number] Patient Account Number: [id number] Date of Birth/Sex: Treating RN: 09-25-1955 (67 y.o. [name] [name] Primary Care [name] [name]: [name] [name] Other Clinician: Referring [name] [name]/Extender: [name] [name]: 0 Abuse Risk Screen Items Answer ABUSE RISK SCREEN: Has anyone close to you tried to hurt or harm you recentlyo No Do you feel uncomfortable with anyone in your familyo No Has anyone forced you do things that you didnt want to doo No Electronic Signature(s) Signed: 12/24/2022 6:19:31 PM By: [name] RN, BSN Entered By: [name] on 12/24/2022 10:50:22 -------------------------------------------------------------------------------- Activities of Daily Living Details Patient Name: Date of Service: [name] [name]. 12/24/2022 10:30 A M Medical Record Number: [id number] Patient Account Number: [id number] Date of Birth/Sex: Treating RN: [birth year] (67 y.o. [name] [name] Primary Care Kinneth Fujiwara: [name] [name] Other Clinician: Referring [name] [name]: Treating [name] [name]/Extender: [name] [name]: 0 Activities of Daily Living Items Answer Activities of Daily Living (Please select one for each item) Drive Automobile Completely Able T Medications ake Completely Able Use T elephone Completely Able Care for Appearance Completely Able Use T oilet Completely Able Bath / Shower Completely Able Dress Self Completely Able Feed Self Completely Able Walk Completely Able Get In / Out Bed Completely Able Housework Completely Able Prepare Meals Completely Able Handle Money Completely Able Shop for Self Completely Able Electronic Signature(s) Signed: 12/24/2022  6:19:31 PM By: [name] RN, BSN Entered By: [name] on 12/24/2022 10:50:36 -------------------------------------------------------------------------------- Education Screening Details Patient Name: Date of Service: [name] 51, [name] 12/24/2022 10:30 A M Medical Record Number: [id number] Patient Account Number: [id number] Date of Birth/Sex: Treating RN: 21-Aug-1955 (67 y.o. [name] [name] Primary Care Hether Anselmo: [name] [name] Other Clinician: Referring Amarri Michaelson: Treating Lindzee Gouge/Extender: [name] [name]: 0 [name] [name] ([id number]) [id number] Page 2 of 4 Primary Learner Assessed: Patient Learning Preferences/Education Level/Primary Language Learning Preference: Explanation, Demonstration, Printed Material Highest Education Level: Grade School Preferred Language: Economist Language Barrier: No Translator Needed: No Memory Deficit: No Emotional Barrier: No Cultural/Religious Beliefs Affecting Medical Care: No Physical Barrier Impaired Vision: No Impaired Hearing: No Decreased Hand dexterity: No Knowledge/Comprehension Knowledge Level: High Comprehension Level: High Ability to understand written instructions: High Ability to understand verbal instructions: High Motivation Anxiety Level: Calm Cooperation: Cooperative Education Importance: Acknowledges Need Interest in Health Problems: Asks Questions Perception: Coherent Willingness to Engage in Self-Management High Activities: Readiness to Engage in Self-Management High Activities: Electronic Signature(s) Signed: 12/24/2022 6:19:31 PM By: [name] RN, BSN Entered By: [name] on 12/24/2022 10:50:52 -------------------------------------------------------------------------------- Fall Risk Assessment Details Patient Name: Date of Service: [name] [name], [name] 12/24/2022 10:30 A M Medical Record Number:  [id number] Patient Account Number: [id number] Date of Birth/Sex: Treating RN: [birth year] (67 y.o. [name] [name] Primary Care [name] [name]: [name] [name] Other Clinician: Referring [name] [name]: Treating [name] [name]/Extender: [name] [name]: 0 Fall Risk Assessment Items Have you had 2 or more falls in the last 12 monthso 0 Yes Have you had any fall that resulted in injury in the last 12 monthso 0 No FALLS RISK SCREEN History of falling - immediate or within 3 months 25 Yes Secondary diagnosis (Do you have 2 or more medical  diagnoseso) 0 No Ambulatory aid None/bed rest/wheelchair/nurse 0 Yes Crutches/cane/walker 0 No Furniture 0 No Intravenous therapy Access/Saline/Heparin Lock 0 No Gait/Transferring Normal/ bed rest/ wheelchair 0 Yes Weak (short steps with or without shuffle, stooped but able to lift head while walking, may seek 0 No support from furniture) Impaired (short steps with shuffle, may have difficulty arising from chair, head down, impaired 0 No balance) Mental Status Oriented to own ability 0 Yes Overestimates or forgets limitations 0 No Risk Level: Medium Risk Score: [address] ([id number]) [phone] [id number] Page 3 of 4 Electronic Signature(s) -------------------------------------------------------------------------------- Foot Assessment Details Patient Name: Date of Service: [name] [name]. 12/24/2022 10:30 A M Medical Record Number: [id number] Patient Account Number: [id number] Date of Birth/Sex: Treating RN: 11-Nov-1955 (67 y.o. [name] [name] Primary Care Naquita Nappier: [name] [name] Other Clinician: Referring Mckenzie Bove: Treating Odean Fester/Extender: [name] [name]: 0 Foot Assessment Items Site Locations + = Sensation present, - = Sensation absent, C = Callus, U = Ulcer R = Redness, W = Warmth, M = Maceration, PU = Pre-ulcerative lesion F = Fissure, S = Swelling, D =  Dryness Assessment Right: Left: Other Deformity: No No Prior Foot Ulcer: No No Prior Amputation: No No Charcot Joint: No No Ambulatory Status: Ambulatory Without Help Gait: Steady Electronic Signature(s) Signed: 12/24/2022 6:19:31 PM By: [name] RN, BSN Entered By: [name] on 12/24/2022 11:10:31 -------------------------------------------------------------------------------- Nutrition Risk Screening Details Patient Name: Date of Service: [name] 42 RRIS M. 12/24/2022 10:30 A M Medical Record Number: [id number] Patient Account Number: [id number] Date of Birth/Sex: Treating RN: 1955/11/27 (67 y.o. [name] [name] Primary Care Giacomo Valone: [name] [name] Other Clinician: Referring Marlan Steward: Treating Joslyn Ramos/Extender: [name] [name]: 0 Height (in): 72 Weight (lbs): 245 Body Mass Index (BMI): 33.2 DERMAINE, CHABRA ([id number]) [phone] [id number] Page 4 of 4 Nutrition Risk Screening Items Score Screening NUTRITION RISK SCREEN: I have an illness or condition that made me change the kind and/or amount of food I eat 2 Yes I eat fewer than two meals per day 0 No I eat few fruits and vegetables, or milk products 0 No I have three or more drinks of beer, liquor or wine almost every day 0 No I have tooth or mouth problems that make it hard for me to eat 0 No I don't always have enough money to buy the food I need 0 No I eat alone most of the time 0 No I take three or more different prescribed or over-the-counter drugs a day 1 Yes Without wanting to, I have lost or gained 10 pounds in the last six months 0 No I am not always physically able to shop, cook and/or feed myself 0 No Nutrition Protocols Good Risk Protocol Provide education on elevated blood Moderate Risk Protocol 0 sugars and impact on wound healing, as applicable High Risk Proctocol Risk Level: Moderate Risk Score: 3 Electronic Signature(s) Signed:  12/24/2022 6:19:31 PM By: [name] RN, BSN Entered By: [name] on 12/24/2022 10:51:11

## 2023-01-03 ENCOUNTER — Encounter (HOSPITAL_BASED_OUTPATIENT_CLINIC_OR_DEPARTMENT_OTHER): Payer: Medicare Other | Admitting: Internal Medicine

## 2023-01-03 DIAGNOSIS — E11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 other skin ulcer: Secondary | ICD-10-CM | POA: Diagnosis not present

## 2023-01-03 DIAGNOSIS — L97828 Non-pressure chronic ulcer of other part of left lower leg with other specified severity: Secondary | ICD-10-CM | POA: Diagnosis not present

## 2023-01-03 DIAGNOSIS — I87313 Chronic venous hypertension (idiopathic) with ulcer of bilateral lower extremity: Secondary | ICD-10-CM

## 2023-01-03 DIAGNOSIS — L97818 Non-pressure chronic ulcer of other part of right lower leg with other specified severity: Secondary | ICD-10-CM | POA: Diagnosis not present

## 2023-01-08 ENCOUNTER — Encounter (HOSPITAL_BASED_OUTPATIENT_CLINIC_OR_DEPARTMENT_OTHER): Payer: Medicare Other | Attending: Internal Medicine | Admitting: Internal Medicine

## 2023-01-08 DIAGNOSIS — F17200 Nicotine dependence, unspecified, uncomplicated: Secondary | ICD-10-CM | POA: Insufficient documentation

## 2023-01-08 DIAGNOSIS — L97818 Non-pressure chronic ulcer of other part of right lower leg with other specified severity: Secondary | ICD-10-CM | POA: Diagnosis not present

## 2023-01-08 DIAGNOSIS — I129 Hypertensive chronic kidney disease with stage 1 through stage 4 chronic kidney disease, or unspecified chronic kidney disease: Secondary | ICD-10-CM | POA: Insufficient documentation

## 2023-01-08 DIAGNOSIS — E1122 Type 2 diabetes mellitus with diabetic chronic kidney disease: Secondary | ICD-10-CM | POA: Insufficient documentation

## 2023-01-08 DIAGNOSIS — E11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 other skin ulcer: Secondary | ICD-10-CM | POA: Insufficient documentation

## 2023-01-08 DIAGNOSIS — L97828 Non-pressure chronic ulcer of other part of left lower leg with other specified severity: Secondary | ICD-10-CM | POA: Insufficient documentation

## 2023-01-08 DIAGNOSIS — I87313 Chronic venous hypertension (idiopathic) with ulcer of bilateral lower extremity: Secondary | ICD-10-CM | POA: Insufficient documentation

## 2023-01-08 DIAGNOSIS — K7469 Other cirrhosis of liver: Secondary | ICD-10-CM | POA: Insufficient documentation

## 2023-01-08 DIAGNOSIS — Z833 Family history of diabetes mellitus: Secondary | ICD-10-CM | POA: Insufficient documentation

## 2023-01-08 DIAGNOSIS — Z8249 Family history of ischemic heart disease and other diseases of the circulatory system: Secondary | ICD-10-CM | POA: Insufficient documentation

## 2023-01-08 DIAGNOSIS — I890 Lymphedema, not elsewhere classified: Secondary | ICD-10-CM | POA: Diagnosis not present

## 2023-01-08 DIAGNOSIS — Z794 Long term (current) use of insulin: Secondary | ICD-10-CM | POA: Diagnosis not present

## 2023-01-08 DIAGNOSIS — N183 Chronic kidney disease, stage 3 unspecified: Secondary | ICD-10-CM | POA: Diagnosis not present

## 2023-01-17 ENCOUNTER — Encounter (HOSPITAL_BASED_OUTPATIENT_CLINIC_OR_DEPARTMENT_OTHER): Payer: Medicare Other | Admitting: Internal Medicine

## 2023-01-17 DIAGNOSIS — L97818 Non-pressure chronic ulcer of other part of right lower leg with other specified severity: Secondary | ICD-10-CM | POA: Diagnosis not present

## 2023-01-17 DIAGNOSIS — E11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 other skin ulcer: Secondary | ICD-10-CM | POA: Diagnosis not present

## 2023-01-17 DIAGNOSIS — L97828 Non-pressure chronic ulcer of other part of left lower leg with other specified severity: Secondary | ICD-10-CM

## 2023-01-17 DIAGNOSIS — I87313 Chronic venous hypertension (idiopathic) with ulcer of bilateral lower extremity: Secondary | ICD-10-CM

## 2023-01-18 NOTE — Progress Notes (Signed)
STEELE, ZIMMERLY ([id number]) [id number] Page 1 of 10 Visit Report for 01/17/2023 Chief Complaint Document Details Patient Name: Date of Service: [name] [name] [name] [name] [name]. 01/17/2023 11:00 A [name] [name] [name] Medical Record Number: [id number] Patient Account Number: [id number] Date of Birth/Sex: Treating RN: 20-May-1956 (67 y.o. [name] [name] [name]) Primary Care Provider: Cranford Mon Other Clinician: Referring Provider: Treating Provider/Extender: Shaune Pollack in Treatment: 3 Information Obtained from: Patient Chief Complaint 12/24/2022; bilateral lower extremity wounds Electronic Signature(s) Signed: 01/17/2023 12:16:14 PM By: Geralyn Corwin DO Entered By: Geralyn Corwin on 01/17/2023 11:58:49 -------------------------------------------------------------------------------- Debridement Details Patient Name: Date of Service: [name] [name] [name] [name] [name], [name] [name] [name] [name] [name]. 01/17/2023 11:00 A [name] [name] [name] Medical Record Number: [id number] Patient Account Number: [id number] Date of Birth/Sex: Treating RN: February 03, 1956 (67 y.o. [name] [name] [name]) Primary Care Provider: Cranford Mon Other Clinician: Referring Provider: Treating Provider/Extender: Shaune Pollack in Treatment: 3 Debridement Performed for Assessment: Wound #1 Left Lower Leg Performed By: Physician Geralyn Corwin, DO Debridement Type: Debridement Severity of Tissue Pre Debridement: Fat layer exposed Level of Consciousness (Pre-procedure): Awake and Alert Pre-procedure Verification/Time Out Yes - 11:38 Taken: Start Time: 11:39 Pain Control: Lidocaine 4% T opical Solution Percent of Wound Bed Debrided: 100% T Area Debrided (cm): otal 0.28 Tissue and other material debrided: Viable, Non-Viable, Slough, Skin: Dermis , Skin: Epidermis, Slough Level: Skin/Epidermis Debridement Description: Selective/Open Wound Instrument: Curette Bleeding: Minimum Hemostasis Achieved: Pressure End Time: 11:44 Procedural Pain: 0 Post Procedural Pain:  0 Response to Treatment: Procedure was tolerated well Level of Consciousness (Post- Awake and Alert procedure): Post Debridement Measurements of Total Wound Length: (cm) 0.4 Width: (cm) 0.9 Depth: (cm) 0.1 Volume: (cm) 0.028 Character of Wound/Ulcer Post Debridement: Improved QUANDELL, ABELLERA ([id number]) [id number] Page 2 of 10 Severity of Tissue Post Debridement: Fat layer exposed Post Procedure Diagnosis Same as Pre-procedure Electronic Signature(s) Signed: 01/17/2023 12:16:14 PM By: Geralyn Corwin DO Entered By: Geralyn Corwin on 01/17/2023 12:02:57 -------------------------------------------------------------------------------- Debridement Details Patient Name: Date of Service: [name] [name] [name] [name] [name], [name] [name] [name] [name] [name]. 01/17/2023 11:00 A [name] [name] [name] Medical Record Number: [id number] Patient Account Number: [id number] Date of Birth/Sex: Treating RN: Jun 27, 1956 (66 y.o. [name] [name] [name]) Primary Care Provider: Cranford Mon Other Clinician: Referring Provider: Treating Provider/Extender: Shaune Pollack in Treatment: 3 Debridement Performed for Assessment: Wound #2 Right,Lateral Lower Leg Performed By: Physician Geralyn Corwin, DO Debridement Type: Debridement Severity of Tissue Pre Debridement: Fat layer exposed Level of Consciousness (Pre-procedure): Awake and Alert Pre-procedure Verification/Time Out Yes - 11:38 Taken: Start Time: 11:39 Pain Control: Lidocaine 4% T opical Solution Percent of Wound Bed Debrided: 100% T Area Debrided (cm): otal 2 Tissue and other material debrided: Viable, Non-Viable, Slough, Skin: Dermis , Skin: Epidermis, Slough Level: Skin/Epidermis Debridement Description: Selective/Open Wound Instrument: Curette Bleeding: Minimum Hemostasis Achieved: Pressure End Time: 11:44 Procedural Pain: 0 Post Procedural Pain: 0 Response to Treatment: Procedure was tolerated well Level of Consciousness (Post- Awake and Alert procedure): Post  Debridement Measurements of Total Wound Length: (cm) 1.7 Width: (cm) 1.5 Depth: (cm) 0.1 Volume: (cm) 0.2 Character of Wound/Ulcer Post Debridement: Improved Severity of Tissue Post Debridement: Fat layer exposed Post Procedure Diagnosis Same as Pre-procedure Electronic Signature(s) Signed: 01/17/2023 12:16:14 PM By: Geralyn Corwin DO Entered By: Geralyn Corwin on 01/17/2023 12:03:08 HPI Details -------------------------------------------------------------------------------- [name] [name] [name] [name] [name] ([id number]) [id number] Page 3 of 10 Patient Name: Date of Service: [name] [name] [name] [name] [name]. 01/17/2023 11:00 A [name] [name] [name] Medical Record Number: [id number] Patient Account Number: [id number] Date of Birth/Sex: Treating RN: Oct 28, 1955 (67 y.o. [name] [name] [name]) Primary  Care Provider: Cranford Mon Other Clinician: Referring Provider: Treating Provider/Extender: Shaune Pollack in Treatment: 3 History of Present Illness HPI Description: 12/24/2022 [name] [name] [name] [name] [name] is a 67 year old male with a past medical history of controlled type 2 diabetes on insulin, lymphedema/chronic venous insufficiency and cirrhosis that presents to the clinic for a 1 year history of nonhealing wounds to his lower extremities bilaterally. He reports the starting spontaneously. He states that he has been following with [facility] and they have been using [facility] antibiotic spray to the legs. He is not wearing compression stockings or wraps. He is also tried Medihoney and collagen in the past with little benefit. He currently denies systemic signs of infection. 5/30; patient presents for follow-up. He has been using triamcinolone cream to the periwound and Santyl to the wound beds. There has been improvement in wound healing. He denies signs of infection and has [name] [name] issues or complaints today. 6/4; both legs look considerably better. He has a wound on the right lateral lower leg and the left  medial lower leg. Significant hemosiderin in the right leg less so on the left. We have been using Hydrofera Blue under compression 6/13; patient presents for follow-up. We have been using antibiotic ointment with Hydrofera Blue under compression therapy. The wounds are smaller. He has [name] [name] issues or complaints today. We discussed ordering juxta lite compression garment wraps T use once his wounds heal as he has hard time putting on o compression stockings. He was agreeable with this. Electronic Signature(s) Signed: 01/17/2023 12:16:14 PM By: Geralyn Corwin DO Entered By: Geralyn Corwin on 01/17/2023 12:00:22 -------------------------------------------------------------------------------- Physical Exam Details Patient Name: Date of Service: [name] [name] [name] [name] [name], [name] [name] [name] [name] [name]. 01/17/2023 11:00 A [name] [name] [name] Medical Record Number: [id number] Patient Account Number: [id number] Date of Birth/Sex: Treating RN: 11-May-1956 (67 y.o. [name] [name] [name]) Primary Care Provider: Cranford Mon Other Clinician: Referring Provider: Treating Provider/Extender: Lanney Gins Weeks in Treatment: 3 Constitutional respirations regular, non-labored and within target range for patient.. Cardiovascular 2+ dorsalis pedis/posterior tibialis pulses. Psychiatric pleasant and cooperative. Notes 2 open wounds to the lower extremities bilaterally with granulation tissue and nonviable tissue. Good edema control. [name] [name] signs of infection including increased warmth, erythema purulent drainage. Electronic Signature(s) Signed: 01/17/2023 12:16:14 PM By: Geralyn Corwin DO Entered By: Geralyn Corwin on 01/17/2023 12:01:06 -------------------------------------------------------------------------------- Physician Orders Details Patient Name: Date of Service: [name] [name] [name] [name] [name], [name] [name] [name] [name] [name]. 01/17/2023 11:00 A [name] [name] [name] Medical Record Number: [id number] Patient Account Number: [id number] [name] [name] [name] [name] [name], [name] [name] [name] [name] [name] ([id number]) [id number] Page 4 of  10 Date of Birth/Sex: Treating RN: 1956-07-20 (67 y.o. [name], [name] Primary Care Provider: Other Clinician: Cranford Mon Referring Provider: Treating Provider/Extender: Shaune Pollack in Treatment: 3 Verbal / Phone Orders: [name] [name] Diagnosis Coding ICD-10 Coding Code Description [phone] Chronic venous hypertension (idiopathic) with ulcer of bilateral lower extremity L97.818 Non-pressure chronic ulcer of other part of right lower leg with other specified severity L97.828 Non-pressure chronic ulcer of other part of left lower leg with other specified severity I89.0 Lymphedema, not elsewhere classified E11.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 other skin ulcer K74.69 Other cirrhosis of liver Follow-up Appointments ppointment in 1 week. - 01/24/2023 Dr. Mikey Bussing 0800 Thursday Return A ppointment in 2 weeks. - 01/31/2023 Dr. Mikey Bussing 130pm Thursday Return A Other: - [facility] to send you your juxtalite HDs Anesthetic (In clinic) Topical Lidocaine 4% applied to wound bed Bathing/ Shower/ Hygiene May shower with protection but do not get wound dressing(s) wet. Protect  dressing(s) with water repellant cover (for example, large plastic bag) or a cast cover and may then take shower. Edema Control - Lymphedema / SCD / Other Elevate legs to the level of the heart or above for 30 minutes daily and/or when sitting for 3-4 times a day throughout the day. Avoid standing for long periods of time. Exercise regularly Compression stocking or Garment 30-40 mm/Hg pressure to: - Juxtalites HD for both legs. Wound Treatment Wound #1 - Lower Leg Wound Laterality: Left Cleanser: Soap and Water 1 x Per Day/30 Days Discharge Instructions: May shower and wash wound with dial antibacterial soap and water prior to dressing change. Topical: Gentamicin 1 x Per Day/30 Days Discharge Instructions: As directed by physician Topical: Mupirocin Ointment 1 x Per Day/30 Days Discharge Instructions: Apply  Mupirocin (Bactroban) as instructed Topical: Triamcinolone 1 x Per Day/30 Days Discharge Instructions: Apply Triamcinolone as directed Prim Dressing: Hydrofera Blue Ready Transfer Foam, 4x5 (in/in) 1 x Per Day/30 Days ary Discharge Instructions: Apply to wound bed as instructed Secondary Dressing: ABD Pad, 8x10 1 x Per Day/30 Days Discharge Instructions: Apply over primary dressing as directed. Secured With: [employer], 4.5x3.1 (in/yd) 1 x Per Day/30 Days Discharge Instructions: Secure with Kerlix as directed. Secured With: 44M Medipore H Soft Cloth Surgical T ape, 4 x 10 (in/yd) 1 x Per Day/30 Days Discharge Instructions: Secure with tape as directed. Compression Wrap: Urgo K2 Lite, (equivalent to a 3 layer) two layer compression system, regular 1 x Per Day/30 Days Discharge Instructions: Apply Urgo K2 Lite as directed (alternative to 3 layer compression). Compression Wrap: Juxtalite HD 1 x Per Day/30 Days Discharge Instructions: do not apply bring to your appts. Wound #2 - Lower Leg Wound Laterality: Right, Lateral Cleanser: Soap and Water 1 x Per Day/30 Days Discharge Instructions: May shower and wash wound with dial antibacterial soap and water prior to dressing change. Topical: Gentamicin 1 x Per Day/30 Days Discharge Instructions: As directed by physician Topical: Mupirocin Ointment 1 x Per Day/30 Days [employer], [employer] ([id number]) [id number] Page 5 of 10 Discharge Instructions: Apply Mupirocin (Bactroban) as instructed Topical: Triamcinolone 1 x Per Day/30 Days Discharge Instructions: Apply Triamcinolone as directed Prim Dressing: Hydrofera Blue Ready Transfer Foam, 4x5 (in/in) 1 x Per Day/30 Days ary Discharge Instructions: Apply to wound bed as instructed Secondary Dressing: ABD Pad, 8x10 1 x Per Day/30 Days Discharge Instructions: Apply over primary dressing as directed. Secured With: [employer], 4.5x3.1 (in/yd) 1 x Per Day/30  Days Discharge Instructions: Secure with Kerlix as directed. Secured With: 44M Medipore H Soft Cloth Surgical T ape, 4 x 10 (in/yd) 1 x Per Day/30 Days Discharge Instructions: Secure with tape as directed. Compression Wrap: Urgo K2 Lite, (equivalent to a 3 layer) two layer compression system, regular 1 x Per Day/30 Days Discharge Instructions: Apply Urgo K2 Lite as directed (alternative to 3 layer compression). Compression Wrap: Juxtalite HD 1 x Per Day/30 Days Discharge Instructions: do not apply bring to your appts. Electronic Signature(s) Signed: 01/17/2023 12:16:14 PM By: Geralyn Corwin DO Entered By: Geralyn Corwin on 01/17/2023 12:01:14 -------------------------------------------------------------------------------- Problem List Details Patient Name: Date of Service: [name] [name] [name] [name] [name], [name] [name] [name] [name] [name]. 01/17/2023 11:00 A [name] [name] [name] Medical Record Number: [id number] Patient Account Number: [id number] Date of Birth/Sex: Treating RN: [birth year] (67 y.o. [name] [name] [name] [name] [name] Primary Care Provider: Cranford Mon Other Clinician: Referring Provider: Treating Provider/Extender: Shaune Pollack in Treatment: 3 Active Problems ICD-10 Encounter Code Description Active Date MDM Diagnosis I87.313 Chronic  venous hypertension (idiopathic) with ulcer of bilateral lower extremity 12/24/2022 [name] [name] Yes L97.818 Non-pressure chronic ulcer of other part of right lower leg with other specified 12/24/2022 [name] [name] Yes severity L97.828 Non-pressure chronic ulcer of other part of left lower leg with other specified 12/24/2022 [name] [name] Yes severity I89.0 Lymphedema, not elsewhere classified 12/24/2022 [name] [name] Yes E11.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 other skin ulcer 12/24/2022 [name] [name] Yes K74.69 Other cirrhosis of liver 12/24/2022 [name] [name] Yes [name] [name] [name] [name] [name], [name] [name] [name] [name] [name] ([id number]) [id number] Page 6 of 10 Inactive Problems Resolved Problems Electronic Signature(s) Signed: 01/17/2023 12:16:14 PM By: Geralyn Corwin  DO Entered By: Geralyn Corwin on 01/17/2023 11:58:02 -------------------------------------------------------------------------------- Progress Note Details Patient Name: Date of Service: [name] [name] [name] [name] [name], [name] [name] [name] [name] [name]. 01/17/2023 11:00 A [name] [name] [name] Medical Record Number: [id number] Patient Account Number: [id number] Date of Birth/Sex: Treating RN: [birth year] (67 y.o. [name] [name] [name]) Primary Care Provider: Cranford Mon Other Clinician: Referring Provider: Treating Provider/Extender: Shaune Pollack in Treatment: 3 Subjective Chief Complaint Information obtained from Patient 12/24/2022; bilateral lower extremity wounds History of Present Illness (HPI) 12/24/2022 [name] [name] [name] [name] [name] is a 67 year old male with a past medical history of controlled type 2 diabetes on insulin, lymphedema/chronic venous insufficiency and cirrhosis that presents to the clinic for a 1 year history of nonhealing wounds to his lower extremities bilaterally. He reports the starting spontaneously. He states that he has been following with [facility] and they have been using [facility] antibiotic spray to the legs. He is not wearing compression stockings or wraps. He is also tried Medihoney and collagen in the past with little benefit. He currently denies systemic signs of infection. 5/30; patient presents for follow-up. He has been using triamcinolone cream to the periwound and Santyl to the wound beds. There has been improvement in wound healing. He denies signs of infection and has [name] [name] issues or complaints today. 6/4; both legs look considerably better. He has a wound on the right lateral lower leg and the left medial lower leg. Significant hemosiderin in the right leg less so on the left. We have been using Hydrofera Blue under compression 6/13; patient presents for follow-up. We have been using antibiotic ointment with Hydrofera Blue under compression therapy. The wounds are smaller. He has [name] [name] issues or complaints today. We  discussed ordering juxta lite compression garment wraps T use once his wounds heal as he has hard time putting on o compression stockings. He was agreeable with this. Patient History Family History Diabetes - Father, Heart Disease - Father. Social History Current every day smoker, Marital Status - Single, Alcohol Use - Never, Drug Use - [name] [name] History, Caffeine Use - Never. Medical History Hematologic/Lymphatic Patient has history of Anemia, Lymphedema Cardiovascular Patient has history of Hypertension Gastrointestinal Patient has history of Cirrhosis - crpytogenic Endocrine Patient has history of Type II Diabetes - 6.2 HgbA1c Medical A Surgical History Notes nd Gastrointestinal GERD Genitourinary stage III CKD [name] [name] [name] [name] [name], [name] [name] [name] [name] [name] ([id number]) [id number] Page 7 of 10 Objective Constitutional respirations regular, non-labored and within target range for patient.. Vitals Time Taken: 11:18 AM, Height: 72 in, Weight: 245 lbs, BMI: 33.2, Temperature: 98.1 F, Pulse: 48 bpm, Respiratory Rate: 20 breaths/min, Blood Pressure: 157/73 mmHg, Capillary Blood Glucose: 110 mg/dl. Cardiovascular 2+ dorsalis pedis/posterior tibialis pulses. Psychiatric pleasant and cooperative. General Notes: 2 open wounds to the lower extremities bilaterally with granulation tissue and nonviable tissue. Good edema control. [name] [name] signs of infection including increased warmth, erythema purulent drainage. Integumentary (Hair, Skin) Wound #1 status is Open. Original cause of wound  was Gradually Appeared. The date acquired was: 11/05/2022. The wound has been in treatment 3 weeks. The wound is located on the Left Lower Leg. The wound measures 0.4cm length x 0.9cm width x 0.1cm depth; 0.283cm^2 area and 0.028cm^3 volume. There is Fat Layer (Subcutaneous Tissue) exposed. There is [name] [name] tunneling or undermining noted. There is a medium amount of serosanguineous drainage noted. The wound margin is distinct  with the outline attached to the wound base. There is large (67-100%) pink granulation within the wound bed. There is a small (1-33%) amount of necrotic tissue within the wound bed including Adherent Slough. The periwound skin appearance exhibited: Rash. The periwound skin appearance did not exhibit: Callus, Crepitus, Excoriation, Induration, Scarring, Dry/Scaly, Maceration, Atrophie Blanche, Cyanosis, Ecchymosis, Hemosiderin Staining, Mottled, Pallor, Rubor, Erythema. Wound #2 status is Open. Original cause of wound was Gradually Appeared. The date acquired was: 11/07/2022. The wound has been in treatment 3 weeks. The wound is located on the Right,Lateral Lower Leg. The wound measures 1.7cm length x 1.5cm width x 0.1cm depth; 2.003cm^2 area and 0.2cm^3 volume. There is Fat Layer (Subcutaneous Tissue) exposed. There is [name] [name] tunneling or undermining noted. There is a medium amount of serosanguineous drainage noted. The wound margin is distinct with the outline attached to the wound base. There is large (67-100%) pink, pale granulation within the wound bed. There is a small (1- 33%) amount of necrotic tissue within the wound bed including Adherent Slough. The periwound skin appearance exhibited: Rash, Dry/Scaly. The periwound skin appearance did not exhibit: Callus, Crepitus, Excoriation, Induration, Scarring, Maceration, Atrophie Blanche, Cyanosis, Ecchymosis, Hemosiderin Staining, Mottled, Pallor, Rubor, Erythema. 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 I debrided nonviable tissue. I recommended continuing  the course with antibiotic ointment and Hydrofera Blue under compression wrap. We will go ahead and order juxta lite compression wraps today so he has these when his wounds healed to use daily. Follow-up in 1 week. Procedures Wound #1 Pre-procedure diagnosis of Wound #1 is a Diabetic Wound/Ulcer of the Lower Extremity located on the Left Lower Leg .Severity of Tissue Pre Debridement is: Fat layer exposed. There was a Selective/Open Wound Skin/Epidermis Debridement with a total area of 0.28 sq cm performed by Geralyn Corwin, DO. With the following instrument(s): Curette to remove Viable and Non-Viable tissue/material. Material removed includes Slough, Skin: Dermis, and Skin: Epidermis after achieving pain control using Lidocaine 4% Topical Solution. A time out was conducted at 11:38, prior to the start of the procedure. A Minimum amount of bleeding was controlled with Pressure. The procedure was tolerated well with a pain level of 0 throughout and a pain level of 0 following the procedure. Post Debridement Measurements: 0.4cm length x 0.9cm width x 0.1cm depth; 0.028cm^3 volume. Character of Wound/Ulcer Post Debridement is improved. Severity of Tissue Post Debridement is: Fat layer exposed. Post procedure Diagnosis Wound #1: Same as Pre-Procedure Pre-procedure diagnosis of Wound #1 is a Diabetic Wound/Ulcer of the Lower Extremity located on the Left Lower Leg . There was a Double Layer Compression Therapy Procedure by [name] [name] Stall, RN. Post procedure Diagnosis Wound #1: Same as Pre-Procedure Wound #2 Pre-procedure diagnosis of Wound #2 is a Diabetic Wound/Ulcer of the Lower  Extremity located on the Right,Lateral Lower Leg .Severity of Tissue Pre Debridement is: Fat layer exposed. There was a Selective/Open Wound Skin/Epidermis Debridement with a total area of 2 sq cm performed by Geralyn Corwin, DO. With the following instrument(s): Curette to remove Viable and Non-Viable tissue/material.  Material removed includes Slough, Skin: Dermis, and Skin: Epidermis after achieving pain control using Lidocaine 4% Topical Solution. A time out was conducted at 11:38, prior to the start of the procedure. A Minimum amount of bleeding was controlled with Pressure. The procedure was tolerated well with a pain level of 0 throughout and a pain level of 0 following the procedure. Post Debridement Measurements: 1.7cm length x 1.5cm width x 0.1cm depth; 0.2cm^3 volume. Character of Wound/Ulcer Post Debridement is improved. Severity of Tissue Post Debridement is: Fat layer exposed. Post procedure Diagnosis Wound #2: Same as Pre-Procedure [name] [name] [name] [name] [name], [name] [name] [name] [name] [name] ([id number]) [id number] Page 8 of 10 Pre-procedure diagnosis of Wound #2 is a Diabetic Wound/Ulcer of the Lower Extremity located on the Right,Lateral Lower Leg . There was a Double Layer Compression Therapy Procedure by [name] [name] Stall, RN. Post procedure Diagnosis Wound #2: Same as Pre-Procedure Plan Follow-up Appointments: Return Appointment in 1 week. - 01/24/2023 Dr. Mikey Bussing 0800 Thursday Return Appointment in 2 weeks. - 01/31/2023 Dr. Mikey Bussing 130pm Thursday Other: - [facility] to send you your juxtalite HDs Anesthetic: (In clinic) Topical Lidocaine 4% applied to wound bed Bathing/ Shower/ Hygiene: May shower with protection but do not get wound dressing(s) wet. Protect dressing(s) with water repellant cover (for example, large plastic bag) or a cast cover and may then take shower. Edema Control - Lymphedema / SCD / Other: Elevate legs to the level of the heart or above for 30 minutes daily and/or when sitting for 3-4 times a day throughout the day. Avoid standing for long periods of time. Exercise regularly Compression stocking or Garment 30-40 mm/Hg pressure to: - Juxtalites HD for both legs. WOUND #1: - Lower Leg Wound Laterality: Left Cleanser: Soap and Water 1 x Per Day/30 Days Discharge Instructions: May shower and  wash wound with dial antibacterial soap and water prior to dressing change. Topical: Gentamicin 1 x Per Day/30 Days Discharge Instructions: As directed by physician Topical: Mupirocin Ointment 1 x Per Day/30 Days Discharge Instructions: Apply Mupirocin (Bactroban) as instructed Topical: Triamcinolone 1 x Per Day/30 Days Discharge Instructions: Apply Triamcinolone as directed Prim Dressing: Hydrofera Blue Ready Transfer Foam, 4x5 (in/in) 1 x Per Day/30 Days ary Discharge Instructions: Apply to wound bed as instructed Secondary Dressing: ABD Pad, 8x10 1 x Per Day/30 Days Discharge Instructions: Apply over primary dressing as directed. Secured With: [employer], 4.5x3.1 (in/yd) 1 x Per Day/30 Days Discharge Instructions: Secure with Kerlix as directed. Secured With: 89M Medipore H Soft Cloth Surgical T ape, 4 x 10 (in/yd) 1 x Per Day/30 Days Discharge Instructions: Secure with tape as directed. Com pression Wrap: Urgo K2 Lite, (equivalent to a 3 layer) two layer compression system, regular 1 x Per Day/30 Days Discharge Instructions: Apply Urgo K2 Lite as directed (alternative to 3 layer compression). Com pression Wrap: Juxtalite HD 1 x Per Day/30 Days Discharge Instructions: do not apply bring to your appts. WOUND #2: - Lower Leg Wound Laterality: Right, Lateral Cleanser: Soap and Water 1 x Per Day/30 Days Discharge Instructions: May shower and wash wound with dial antibacterial soap and water prior to dressing change. Topical: Gentamicin 1 x Per Day/30 Days Discharge Instructions: As directed by physician Topical: Mupirocin Ointment 1 x Per Day/30  Days Discharge Instructions: Apply Mupirocin (Bactroban) as instructed Topical: Triamcinolone 1 x Per Day/30 Days Discharge Instructions: Apply Triamcinolone as directed Prim Dressing: Hydrofera Blue Ready Transfer Foam, 4x5 (in/in) 1 x Per Day/30 Days ary Discharge Instructions: Apply to wound bed as instructed Secondary Dressing: ABD  Pad, 8x10 1 x Per Day/30 Days Discharge Instructions: Apply over primary dressing as directed. Secured With: [employer], 4.5x3.1 (in/yd) 1 x Per Day/30 Days Discharge Instructions: Secure with Kerlix as directed. Secured With: 79M Medipore H Soft Cloth Surgical T ape, 4 x 10 (in/yd) 1 x Per Day/30 Days Discharge Instructions: Secure with tape as directed. Com pression Wrap: Urgo K2 Lite, (equivalent to a 3 layer) two layer compression system, regular 1 x Per Day/30 Days Discharge Instructions: Apply Urgo K2 Lite as directed (alternative to 3 layer compression). Com pression Wrap: Juxtalite HD 1 x Per Day/30 Days Discharge Instructions: do not apply bring to your appts. 1. In office sharp debridement 2. Hydrofera Blue with antibiotic ointment under 3 layer compression to lower extremities bilaterally 3. Follow-up in 1 week 4. Order juxta lite compression garment wraps Electronic Signature(s) Signed: 01/17/2023 5:31:32 PM By: [name] [name] Stall RN, BSN Signed: 01/21/2023 12:26:35 PM By: Geralyn Corwin DO Previous Signature: 01/17/2023 12:16:14 PM Version By: Geralyn Corwin DO Entered By: [name] [name] [name] [name] [name] on 01/17/2023 16:46:47 [name] [name] [name] [name] [name] ([id number]) [id number] Page 9 of 10 -------------------------------------------------------------------------------- HxROS Details Patient Name: Date of Service: [name] [name] [name] [name] [name]. 01/17/2023 11:00 A [name] [name] [name] Medical Record Number: [id number] Patient Account Number: [id number] Date of Birth/Sex: Treating RN: February 16, 1956 (67 y.o. [name] [name] [name]) Primary Care Provider: Cranford Mon Other Clinician: Referring Provider: Treating Provider/Extender: Shaune Pollack in Treatment: 3 Hematologic/Lymphatic Medical History: Positive for: Anemia; Lymphedema Cardiovascular Medical History: Positive for: Hypertension Gastrointestinal Medical History: Positive for: Cirrhosis - crpytogenic Past Medical History  Notes: GERD Endocrine Medical History: Positive for: Type II Diabetes - 6.2 HgbA1c Time with diabetes: 20 yeatrs Treated with: Insulin Genitourinary Medical History: Past Medical History Notes: stage III CKD Immunizations Pneumococcal Vaccine: Received Pneumococcal Vaccination: [name] [name] Implantable Devices [name] [name] devices added Family and Social History Diabetes: Yes - Father; Heart Disease: Yes - Father; Current every day smoker; Marital Status - Single; Alcohol Use: Never; Drug Use: [name] [name] History; Caffeine Use: Never; Financial Concerns: [name] [name]; Food, Clothing or Shelter Needs: [name] [name]; Support System Lacking: [name] [name]; Transportation Concerns: [name] [name] Electronic Signature(s) Signed: 01/17/2023 12:16:14 PM By: Geralyn Corwin DO Entered By: Geralyn Corwin on 01/17/2023 12:00:29 -------------------------------------------------------------------------------- SuperBill Details Patient Name: Date of Service: [name] [name] [name] [name] [name], [name] [name] [name] [name] [name]. 01/17/2023 Medical Record Number: [id number] Patient Account Number: [id number] Date of Birth/Sex: Treating RN: 1956/01/19 (67 y.o. 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 L97.828 Non-pressure chronic ulcer of other part of left lower leg with other specified severity I89.0 Lymphedema, not elsewhere classified E11.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 other skin ulcer K74.69 Other cirrhosis of liver Facility Procedures : CPT4 Code: [id number] Description: 97597 - DEBRIDE WOUND 1ST 20 SQ CM OR < ICD-10 Diagnosis Description L97.818 Non-pressure  chronic ulcer of other part of right lower leg with other specified s L97.828 Non-pressure chronic ulcer of other part of left lower leg  with other  specified se 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 Modifier: everity verity Quantity: 1 Physician Procedures : CPT4 Code Description Modifier [id number] - WC PHYS DEBR WO ANESTH 20 SQ CM ICD-10 Diagnosis Description L97.818 Non-pressure chronic ulcer of other part of right lower leg with other specified severity L97.828 Non-pressure chronic ulcer of other  part of left lower leg with other specified severity 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 Quantity: 1 Electronic Signature(s) Signed: 01/17/2023 12:16:14 PM By: Geralyn Corwin DO Entered By: Geralyn Corwin on 01/17/2023 12:03:36

## 2023-01-19 NOTE — Progress Notes (Signed)
BILLIE, LUEBBERS ([id number]) [id number] Page 1 of 11 Visit Report for 01/17/2023 Arrival Information Details Patient Name: Date of Service: [name] 01/17/2023 11:00 A M Medical Record Number: [id number] Patient Account Number: [id number] Date of Birth/Sex: Treating RN: 1955-10-19 (67 y.o. [name], [name] Primary Care Jes Costales: [place] Other Clinician: Referring Timohty Renbarger: Treating Johany Hansman/Extender: Shaune Pollack in Treatment: 3 Visit Information History Since Last Visit Added or deleted any medications: No Patient Arrived: Ambulatory Any new allergies or adverse reactions: No Arrival Time: 11:13 Had a fall or experienced change in No Accompanied By: sister activities of daily living that may affect Transfer Assistance: None risk of falls: Patient Identification Verified: Yes Signs or symptoms of abuse/neglect since last visito No Secondary Verification Process Completed: Yes Hospitalized since last visit: No Patient Requires Transmission-Based No Implantable device outside of the clinic excluding No Precautions: cellular tissue based products placed in the center Patient Has Alerts: Yes since last visit: Patient Alerts: 10/23 ABI L0.94 R1 VVS Has Dressing in Place as Prescribed: Yes 10/23 TBI L1.2 R0.88 VVS Has Compression in Place as Prescribed: Yes Pain Present Now: No Electronic Signature(s) Signed: 01/17/2023 5:31:32 PM By: [name] RN, BSN Entered By: [name] on 01/17/2023 11:13:44 -------------------------------------------------------------------------------- Compression Therapy Details Patient Name: Date of Service: [name] 01/17/2023 11:00 A M Medical Record Number: [id number] Patient Account Number: [id number] Date of Birth/Sex: Treating RN: 06-01-1956 (67 y.o. [name] Primary Care Noelani Harbach: [place] Mon Other Clinician: Referring Nazair Fortenberry: Treating Ruchama Kubicek/Extender:  Shaune Pollack in Treatment: 3 Compression Therapy Performed for Wound Assessment: Wound #1 Left Lower Leg Performed By: Clinician [name], RN Compression Type: Double Layer Post Procedure Diagnosis Same as Pre-procedure Electronic Signature(s) Signed: 01/17/2023 5:31:32 PM By: [name] RN, BSN Entered By: [name] on 01/17/2023 11:47:57 Cay Schillings ([id number]) [id number] Page 2 of 11 -------------------------------------------------------------------------------- Compression Therapy Details Patient Name: Date of Service: [name] 01/17/2023 11:00 A M Medical Record Number: [id number] Patient Account Number: [id number] Date of Birth/Sex: Treating RN: [birth year] (67 y.o. [name] Primary Care Jereld Presti: [place] Mon Other Clinician: Referring Garold Sheeler: Treating Allin Frix/Extender: Shaune Pollack in Treatment: 3 Compression Therapy Performed for Wound Assessment: Wound #2 Right,Lateral Lower Leg Performed By: Clinician [name], RN Compression Type: Double Layer Post Procedure Diagnosis Same as Pre-procedure Electronic Signature(s) Signed: 01/17/2023 5:31:32 PM By: [name] RN, BSN Entered By: [name] on 01/17/2023 11:47:57 -------------------------------------------------------------------------------- Encounter Discharge Information Details Patient Name: Date of Service: [name], [name] RRIS M. 01/17/2023 11:00 A M Medical Record Number: [id number] Patient Account Number: [id number] Date of Birth/Sex: Treating RN: Dec 20, 1955 (67 y.o. [name] Primary Care Larone Kliethermes: [place] Mon Other Clinician: Referring Montrae Braithwaite: Treating Ronnae Kaser/Extender: Shaune Pollack in Treatment: 3 Encounter Discharge Information Items Post Procedure Vitals Discharge Condition: Stable Temperature (F): 98.1 Ambulatory Status: Ambulatory Pulse (bpm):  48 Discharge Destination: Home Respiratory Rate (breaths/min): 20 Transportation: Private Auto Blood Pressure (mmHg): 157/73 Accompanied By: sister Schedule Follow-up Appointment: Yes Clinical Summary of Care: Electronic Signature(s) Signed: 01/17/2023 5:31:32 PM By: [name] RN, BSN Entered By: [name] on 01/17/2023 11:48:57 -------------------------------------------------------------------------------- Lower Extremity Assessment Details Patient Name: Date of Service: [name], [name] 01/17/2023 11:00 A M Medical Record Number: [id number] Patient Account Number: [id number] Date of Birth/Sex: Treating RN: 05-08-1956 (67 y.o. [name] Primary Care Christl Fessenden: [place] Mon  Other Clinician: Referring Cheri Ayotte: Treating Haisley Arens/Extender: Lanney Gins Weeks in Treatment: 3 Edema Assessment Assessed: [Left: Yes] [name]: Yes] Edema: [Left: Yes] [Right: Yes] [address] HULICES, TINDELL ([id number]) [id number] Page 3 of 11 Left: Right: Point of Measurement: 37 cm From Medial Instep 33.4 cm 33 cm Ankle Left: Right: Point of Measurement: 13 cm From Medial Instep 22 cm 18 cm Vascular Assessment Pulses: Dorsalis Pedis Palpable: [Left:Yes] [Right:Yes] Electronic Signature(s) Signed: 01/17/2023 5:31:32 PM By: [name] RN, BSN Entered By: [name] on 01/17/2023 11:11:14 -------------------------------------------------------------------------------- Multi Wound Chart Details Patient Name: Date of Service: [name], [name] RRIS M. 01/17/2023 11:00 A M Medical Record Number: [id number] Patient Account Number: [id number] Date of Birth/Sex: Treating RN: 04-06-1956 (67 y.o. M) Primary Care Desjuan Stearns: [place] Mon Other Clinician: Referring Tavion Senkbeil: Treating Nicky Kras/Extender: Shaune Pollack in Treatment: 3 Vital Signs Height(in): 72 Capillary Blood Glucose(mg/dl): 086 Weight(lbs): 578 Pulse(bpm): 48 Body  Mass Index(BMI): 33.2 Blood Pressure(mmHg): 157/73 Temperature(F): 98.1 Respiratory Rate(breaths/min): 20 [1:Photos:] [N/A:N/A] Left Lower Leg Right, Lateral Lower Leg N/A Wound Location: Gradually Appeared Gradually Appeared N/A Wounding Event: Diabetic Wound/Ulcer of the Lower Diabetic Wound/Ulcer of the Lower N/A Primary Etiology: Extremity Extremity Lymphedema Lymphedema N/A Secondary Etiology: Anemia, Lymphedema, Hypertension, Anemia, Lymphedema, Hypertension, N/A Comorbid History: Cirrhosis , Type II Diabetes Cirrhosis , Type II Diabetes 11/05/2022 11/07/2022 N/A Date Acquired: 3 3 N/A Weeks of Treatment: Open Open N/A Wound Status: No No N/A Wound Recurrence: Yes Yes N/A Clustered Wound: 0 1 N/A Clustered Quantity: 0.4x0.9x0.1 1.7x1.5x0.1 N/A Measurements L x W x D (cm) 0.283 2.003 N/A A (cm) : rea 0.028 0.2 N/A Volume (cm) : 94.90% 85.40% N/A % Reduction in A rea: 97.50% 92.70% N/A % Reduction in Volume: Grade 1 Grade 2 N/A Classification: Medium Medium N/A Exudate A mount: Serosanguineous Serosanguineous N/A Exudate Type: red, brown red, brown N/A Exudate Color: Distinct, outline attached Distinct, outline attached N/A Wound MarginCASSIEL, SHARPLEY ([id number]) [id number] Page 4 of 11 Large (67-100%) Large (67-100%) N/A Granulation Amount: Pink Pink, Pale N/A Granulation Quality: Small (1-33%) Small (1-33%) N/A Necrotic Amount: Fat Layer (Subcutaneous Tissue): Yes Fat Layer (Subcutaneous Tissue): Yes N/A Exposed Structures: Fascia: No Fascia: No Tendon: No Tendon: No Muscle: No Muscle: No Joint: No Joint: No Bone: No Bone: No Large (67-100%) Medium (34-66%) N/A Epithelialization: Debridement - Excisional Debridement - Excisional N/A Debridement: Pre-procedure Verification/Time Out 11:38 11:38 N/A Taken: Lidocaine 4% Topical Solution Lidocaine 4% Topical Solution N/A Pain Control: Subcutaneous, Slough  Subcutaneous, Slough N/A Tissue Debrided: Skin/Subcutaneous Tissue Skin/Subcutaneous Tissue N/A Level: 0.28 2 N/A Debridement A (sq cm): rea Curette Curette N/A Instrument: Minimum Minimum N/A Bleeding: Pressure Pressure N/A Hemostasis A chieved: 0 0 N/A Procedural Pain: 0 0 N/A Post Procedural Pain: Procedure was tolerated well Procedure was tolerated well N/A Debridement Treatment Response: 0.4x0.9x0.1 1.7x1.5x0.1 N/A Post Debridement Measurements L x W x D (cm) 0.028 0.2 N/A Post Debridement Volume: (cm) Rash: Yes Rash: Yes N/A Periwound Skin Texture: Excoriation: No Excoriation: No Induration: No Induration: No Callus: No Callus: No Crepitus: No Crepitus: No Scarring: No Scarring: No Maceration: No Dry/Scaly: Yes N/A Periwound Skin Moisture: Dry/Scaly: No Maceration: No Atrophie Blanche: No Atrophie Blanche: No N/A Periwound Skin Color: Cyanosis: No Cyanosis: No Ecchymosis: No Ecchymosis: No Erythema: No Erythema: No Hemosiderin Staining: No Hemosiderin Staining: No Mottled: No Mottled: No Pallor: No Pallor: No Rubor: No Rubor: No Compression Therapy Compression Therapy N/A Procedures Performed: Debridement Debridement Treatment Notes Wound #1 (Lower  Leg) Wound Laterality: Left Cleanser Soap and Water Discharge Instruction: May shower and wash wound with dial antibacterial soap and water prior to dressing change. Peri-Wound Care Topical Gentamicin Discharge Instruction: As directed by physician Mupirocin Ointment Discharge Instruction: Apply Mupirocin (Bactroban) as instructed Triamcinolone Discharge Instruction: Apply Triamcinolone as directed Primary Dressing Hydrofera Blue Ready Transfer Foam, 4x5 (in/in) Discharge Instruction: Apply to wound bed as instructed Secondary Dressing ABD Pad, 8x10 Discharge Instruction: Apply over primary dressing as directed. Secured With [employer], 4.5x3.1 (in/yd) Discharge Instruction:  Secure with Kerlix as directed. 44M Medipore H Soft Cloth Surgical T ape, 4 x 10 (in/yd) Discharge Instruction: Secure with tape as directed. Compression Wrap Urgo K2 Lite, (equivalent to a 3 layer) two layer compression system, regular Discharge Instruction: Apply Urgo K2 Lite as directed (alternative to 3 layer compression). MALIQUE, DEBELLIS ([id number]) [id number] Page 5 of 11 Juxtalite HD Discharge Instruction: do not apply bring to your appts. Compression Stockings Add-Ons Wound #2 (Lower Leg) Wound Laterality: Right, Lateral Cleanser Soap and Water Discharge Instruction: May shower and wash wound with dial antibacterial soap and water prior to dressing change. Peri-Wound Care Topical Gentamicin Discharge Instruction: As directed by physician Mupirocin Ointment Discharge Instruction: Apply Mupirocin (Bactroban) as instructed Triamcinolone Discharge Instruction: Apply Triamcinolone as directed Primary Dressing Hydrofera Blue Ready Transfer Foam, 4x5 (in/in) Discharge Instruction: Apply to wound bed as instructed Secondary Dressing ABD Pad, 8x10 Discharge Instruction: Apply over primary dressing as directed. Secured With [employer], 4.5x3.1 (in/yd) Discharge Instruction: Secure with Kerlix as directed. 44M Medipore H Soft Cloth Surgical T ape, 4 x 10 (in/yd) Discharge Instruction: Secure with tape as directed. Compression Wrap Urgo K2 Lite, (equivalent to a 3 layer) two layer compression system, regular Discharge Instruction: Apply Urgo K2 Lite as directed (alternative to 3 layer compression). Juxtalite HD Discharge Instruction: do not apply bring to your appts. Compression Stockings Add-Ons Electronic Signature(s) Signed: 01/17/2023 12:16:14 PM By: Geralyn Corwin DO Entered By: Geralyn Corwin on 01/17/2023 11:58:40 -------------------------------------------------------------------------------- Multi-Disciplinary Care Plan  Details Patient Name: Date of Service: [name], [name] 01/17/2023 11:00 A M Medical Record Number: [id number] Patient Account Number: [id number] Date of Birth/Sex: Treating RN: 01-Oct-1955 (67 y.o. [name] Primary Care Allix Blomquist: [place] Mon Other Clinician: Referring Karren Newland: Treating Reeda Soohoo/Extender: Shaune Pollack in Treatment: [address] PRAJIT, BALTAZAR ([id number]) [id number] Page 6 of 11 Pain, Acute or Chronic Nursing Diagnoses: Pain, acute or chronic: actual or potential Potential alteration in comfort, pain Goals: Patient will verbalize adequate pain control and receive pain control interventions during procedures as needed Date Initiated: 12/24/2022 Target Resolution Date: 02/01/2023 Goal Status: Active Patient/caregiver will verbalize comfort level met Date Initiated: 12/24/2022 Target Resolution Date: 02/01/2023 Goal Status: Active Interventions: Encourage patient to take pain medications as prescribed Provide education on pain management Treatment Activities: Administer pain control measures as ordered : 12/24/2022 Notes: Wound/Skin Impairment Nursing Diagnoses: Knowledge deficit related to ulceration/compromised skin integrity Goals: Patient/caregiver will verbalize understanding of skin care regimen Date Initiated: 12/24/2022 Target Resolution Date: 02/01/2023 Goal Status: Active Interventions: Assess patient/caregiver ability to perform ulcer/skin care regimen upon admission and as needed Assess ulceration(s) every visit Provide education on ulcer and skin care Treatment Activities: Skin care regimen initiated : 12/24/2022 Topical wound management initiated : 12/24/2022 Notes: Electronic Signature(s) Signed: 01/17/2023 5:31:32 PM By: [name] RN, BSN Entered By: [name] on 01/17/2023 11:18:58 -------------------------------------------------------------------------------- Pain  Assessment Details Patient Name: Date of Service: [name], NO  RRIS M. 01/17/2023 11:00 A M Medical Record Number: [id number] Patient Account Number: [id number] Date of Birth/Sex: Treating RN: 1956/03/18 (67 y.o. [name] Primary Care Hind Chesler: [place] Mon Other Clinician: Referring Jayland Null: Treating Kambree Krauss/Extender: Shaune Pollack in Treatment: 3 Active Problems Location of Pain Severity and Description of Pain Patient Has Paino No Site Locations [place], [place] ([id number]) [id number] Page 7 of 11 Pain Management and Medication Current Pain Management: Electronic Signature(s) Signed: 01/17/2023 5:31:32 PM By: [name] RN, BSN Entered By: [name] on 01/17/2023 11:13:24 -------------------------------------------------------------------------------- Patient/Caregiver Education Details Patient Name: Date of Service: [name], [name] 6/13/2024andnbsp11:00 A M Medical Record Number: [id number] Patient Account Number: [id number] Date of Birth/Gender: Treating RN: 02-03-1956 (67 y.o. [name] Primary Care Physician: [place] Mon Other Clinician: Referring Physician: Treating Physician/Extender: Shaune Pollack in Treatment: 3 Education Assessment Education Provided To: Patient Education Topics Provided Wound/Skin Impairment: Handouts: Caring for Your Ulcer Methods: Explain/Verbal Responses: Reinforcements needed Electronic Signature(s) Signed: 01/17/2023 5:31:32 PM By: [name] RN, BSN Entered By: [name] on 01/17/2023 11:19:08 -------------------------------------------------------------------------------- Wound Assessment Details Patient Name: Date of Service: [name], [name] 01/17/2023 11:00 A M Medical Record Number: [id number] Patient Account Number: [id number] Date of Birth/Sex: Treating RN: 1955/08/18 (67 y.o. [name] Primary Care Edwena Mayorga: [place] Other Clinician: ARTHURO, NOETZEL ([id number]) [id number] Page 8 of 11 Referring Kwasi Joung: Treating Yvonda Fouty/Extender: Shaune Pollack in Treatment: 3 Wound Status Wound Number: 1 Primary Etiology: Diabetic Wound/Ulcer of the Lower Extremity Wound Location: Left Lower Leg Secondary Lymphedema Etiology: Wounding Event: Gradually Appeared Wound Status: Open Date Acquired: 11/05/2022 Comorbid Anemia, Lymphedema, Hypertension, Cirrhosis , Type II Weeks Of Treatment: 3 History: Diabetes Clustered Wound: Yes Photos Wound Measurements Length: (cm) Width: (cm) Depth: (cm) Clustered Quantity: Area: (cm) Volume: (cm) 0.4 % Reduction in Area: 94.9% 0.9 % Reduction in Volume: 97.5% 0.1 Epithelialization: Large (67-100%) 0 Tunneling: No 0.283 Undermining: No 0.028 Wound Description Classification: Grade 1 Wound Margin: Distinct, outline attached Exudate Amount: Medium Exudate Type: Serosanguineous Exudate Color: red, brown Foul Odor After Cleansing: No Slough/Fibrino Yes Wound Bed Granulation Amount: Large (67-100%) Exposed Structure Granulation Quality: Pink Fascia Exposed: No Necrotic Amount: Small (1-33%) Fat Layer (Subcutaneous Tissue) Exposed: Yes Necrotic Quality: Adherent Slough Tendon Exposed: No Muscle Exposed: No Joint Exposed: No Bone Exposed: No Periwound Skin Texture Texture Color No Abnormalities Noted: No No Abnormalities Noted: No Callus: No Atrophie Blanche: No Crepitus: No Cyanosis: No Excoriation: No Ecchymosis: No Induration: No Erythema: No Rash: Yes Hemosiderin Staining: No Scarring: No Mottled: No Pallor: No Moisture Rubor: No No Abnormalities Noted: No Dry / Scaly: No Maceration: No Treatment Notes Wound #1 (Lower Leg) Wound Laterality: Left Cleanser Soap and Water Discharge Instruction: May shower and wash wound with dial antibacterial soap and water prior to dressing  change. Peri-Wound Care TRASHAUN, MULKINS K ([id number]) [id number] Page 9 of 11 Topical Gentamicin Discharge Instruction: As directed by physician Mupirocin Ointment Discharge Instruction: Apply Mupirocin (Bactroban) as instructed Triamcinolone Discharge Instruction: Apply Triamcinolone as directed Primary Dressing Hydrofera Blue Ready Transfer Foam, 4x5 (in/in) Discharge Instruction: Apply to wound bed as instructed Secondary Dressing ABD Pad, 8x10 Discharge Instruction: Apply over primary dressing as directed. Secured With [employer], 4.5x3.1 (in/yd) Discharge Instruction: Secure with Kerlix as directed. 45M Medipore H Soft Cloth Surgical T ape, 4 x 10 (in/yd) Discharge Instruction: Secure with tape as directed. Compression Wrap Urgo K2  Lite, (equivalent to a 3 layer) two layer compression system, regular Discharge Instruction: Apply Urgo K2 Lite as directed (alternative to 3 layer compression). Juxtalite HD Discharge Instruction: do not apply bring to your appts. Compression Stockings Add-Ons Electronic Signature(s) Signed: 01/17/2023 5:31:32 PM By: [name] RN, BSN Entered By: [name] on 01/17/2023 11:40:50 -------------------------------------------------------------------------------- Wound Assessment Details Patient Name: Date of Service: [name], [name] 01/17/2023 11:00 A M Medical Record Number: [id number] Patient Account Number: [id number] Date of Birth/Sex: Treating RN: 10/25/1955 (67 y.o. [name] Primary Care Arron Tetrault: [place] Mon Other Clinician: Referring Munira Polson: Treating Sayward Horvath/Extender: Lanney Gins Weeks in Treatment: 3 Wound Status Wound Number: 2 Primary Etiology: Diabetic Wound/Ulcer of the Lower Extremity Wound Location: Right, Lateral Lower Leg Secondary Lymphedema Etiology: Wounding Event: Gradually Appeared Wound Status: Open Date Acquired: 11/07/2022 Comorbid Anemia,  Lymphedema, Hypertension, Cirrhosis , Type II Weeks Of Treatment: 3 History: Diabetes Clustered Wound: Yes Photos RASHAWN, HABICHT ([id number]) [id number] Page 10 of 11 Wound Measurements Length: (cm) Width: (cm) Depth: (cm) Clustered Quantity: Area: (cm) Volume: (cm) 1.7 % Reduction in Area: 85.4% 1.5 % Reduction in Volume: 92.7% 0.1 Epithelialization: Medium (34-66%) 1 Tunneling: No 2.003 Undermining: No 0.2 Wound Description Classification: Grade 2 Wound Margin: Distinct, outline attached Exudate Amount: Medium Exudate Type: Serosanguineous Exudate Color: red, brown Foul Odor After Cleansing: No Slough/Fibrino Yes Wound Bed Granulation Amount: Large (67-100%) Exposed Structure Granulation Quality: Pink, Pale Fascia Exposed: No Necrotic Amount: Small (1-33%) Fat Layer (Subcutaneous Tissue) Exposed: Yes Necrotic Quality: Adherent Slough Tendon Exposed: No Muscle Exposed: No Joint Exposed: No Bone Exposed: No Periwound Skin Texture Texture Color No Abnormalities Noted: No No Abnormalities Noted: No Callus: No Atrophie Blanche: No Crepitus: No Cyanosis: No Excoriation: No Ecchymosis: No Induration: No Erythema: No Rash: Yes Hemosiderin Staining: No Scarring: No Mottled: No Pallor: No Moisture Rubor: No No Abnormalities Noted: No Dry / Scaly: Yes Maceration: No Treatment Notes Wound #2 (Lower Leg) Wound Laterality: Right, Lateral Cleanser Soap and Water Discharge Instruction: May shower and wash wound with dial antibacterial soap and water prior to dressing change. Peri-Wound Care Topical Gentamicin Discharge Instruction: As directed by physician Mupirocin Ointment Discharge Instruction: Apply Mupirocin (Bactroban) as instructed Triamcinolone Discharge Instruction: Apply Triamcinolone as directed Primary Dressing Hydrofera Blue Ready Transfer Foam, 4x5 (in/in) Discharge Instruction: Apply to wound bed as  instructed DAEMIAN, BINGER ([id number]) [id number] Page 11 of 11 Secondary Dressing ABD Pad, 8x10 Discharge Instruction: Apply over primary dressing as directed. Secured With [employer], 4.5x3.1 (in/yd) Discharge Instruction: Secure with Kerlix as directed. 33M Medipore H Soft Cloth Surgical T ape, 4 x 10 (in/yd) Discharge Instruction: Secure with tape as directed. Compression Wrap Urgo K2 Lite, (equivalent to a 3 layer) two layer compression system, regular Discharge Instruction: Apply Urgo K2 Lite as directed (alternative to 3 layer compression). Juxtalite HD Discharge Instruction: do not apply bring to your appts. Compression Stockings Add-Ons Electronic Signature(s) Signed: 01/17/2023 5:31:32 PM By: [name] RN, BSN Entered By: [name] on 01/17/2023 11:41:17 -------------------------------------------------------------------------------- Vitals Details Patient Name: Date of Service: [name], [name] 01/17/2023 11:00 A M Medical Record Number: [id number] Patient Account Number: [id number] Date of Birth/Sex: Treating RN: 1956-07-30 (67 y.o. [name] Primary Care Oberon Hehir: [place] Mon Other Clinician: Referring Eland Lamantia: Treating Codie Krogh/Extender: Shaune Pollack in Treatment: 3 Vital Signs Time Taken: 11:18 Temperature (F): 98.1 Height (in): 72 Pulse (bpm): 48 Weight (lbs): 245 Respiratory Rate (breaths/min):  20 Body Mass Index (BMI): 33.2 Blood Pressure (mmHg): 157/73 Capillary Blood Glucose (mg/dl): 086 Reference Range: 80 - 120 mg / dl Electronic Signature(s) Signed: 01/17/2023 5:31:32 PM By: [name] RN, BSN Entered By: [name] on 01/17/2023 11:18:15

## 2023-01-24 ENCOUNTER — Encounter (HOSPITAL_BASED_OUTPATIENT_CLINIC_OR_DEPARTMENT_OTHER): Payer: Medicare Other | Admitting: Internal Medicine

## 2023-01-24 DIAGNOSIS — I87313 Chronic venous hypertension (idiopathic) with ulcer of bilateral lower extremity: Secondary | ICD-10-CM | POA: Diagnosis not present

## 2023-01-24 DIAGNOSIS — L97818 Non-pressure chronic ulcer of other part of right lower leg with other specified severity: Secondary | ICD-10-CM

## 2023-01-24 DIAGNOSIS — E11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 other skin ulcer: Secondary | ICD-10-CM

## 2023-01-25 NOTE — Progress Notes (Signed)
TRAN, ARZUAGA ([id number]) [id number] Page 1 of 10 Visit Report for 01/24/2023 Chief Complaint Document Details Patient Name: Date of Service: [name] [name]. 01/24/2023 8:00 A [name] Medical Record Number: [id number] Patient Account Number: [id number] Date of Birth/Sex: Treating RN: 1955/12/28 (67 y.o. [name]) Primary Care Provider: Cranford Mon Other Clinician: Referring Provider: Treating Provider/Extender: Shaune Pollack in Treatment: 4 Information Obtained from: Patient Chief Complaint 12/24/2022; bilateral lower extremity wounds Electronic Signature(s) Signed: 01/24/2023 4:07:01 PM By: Geralyn Corwin DO Entered By: Geralyn Corwin on 01/24/2023 09:16:26 -------------------------------------------------------------------------------- Cellular or Tissue Based Product Details Patient Name: Date of Service: [name] [name], [name] [name]. 01/24/2023 8:00 A [name] Medical Record Number: [id number] Patient Account Number: [id number] Date of Birth/Sex: Treating RN: January 30, 1956 (67 y.o. [name] [name] Primary Care Provider: Cranford Mon Other Clinician: Referring Provider: Treating Provider/Extender: Shaune Pollack in Treatment: 4 Cellular or Tissue Based Product Type Wound #2 Right,Lateral Lower Leg Applied to: Performed By: Physician Geralyn Corwin, DO Cellular or Tissue Based Product Type: Puraply AM Level of Consciousness (Pre-procedure): Awake and Alert Pre-procedure Verification/Time Out Yes - 09:00 Taken: Location: trunk / arms / legs Wound Size (sq cm): 0.96 Product Size (sq cm): 8 Waste Size (sq cm): 4 Waste Reason: wound size Amount of Product Applied (sq cm): 4 Instrument Used: Forceps, Scissors Lot #: [id number] Order #: 1 Expiration Date: 12/23/2024 Fenestrated: [name] Reconstituted: Yes Solution Type: normal saline Solution Amount: 3mL Lot #: [id number] Solution Expiration Date: 01/03/2025 Secured:  Yes Secured With: Steri-Strips, adaptic Dressing Applied: [name] Procedural Pain: 0 Post Procedural Pain: 0 Response to Treatment: Procedure was tolerated well Level of Consciousness (Post- Awake and [address] HASNAIN, MANHEIM ([id number]) [id number] Page 2 of 10 procedure): Post Procedure Diagnosis Same as Pre-procedure Electronic Signature(s) Signed: 01/24/2023 4:07:01 PM By: Geralyn Corwin DO Signed: 01/24/2023 6:06:31 PM By: [name] RN, BSN Entered By: [name] on 01/24/2023 09:05:02 -------------------------------------------------------------------------------- Debridement Details Patient Name: Date of Service: [name] [name], [name] [name]. 01/24/2023 8:00 A [name] Medical Record Number: [id number] Patient Account Number: [id number] Date of Birth/Sex: Treating RN: Mar 19, 1956 (67 y.o. [name] [name], [name] Primary Care Provider: Cranford Mon Other Clinician: Referring Provider: Treating Provider/Extender: Shaune Pollack in Treatment: 4 Debridement Performed for Assessment: Wound #2 Right,Lateral Lower Leg Performed By: Physician Geralyn Corwin, DO Debridement Type: Debridement Severity of Tissue Pre Debridement: Fat layer exposed Level of Consciousness (Pre-procedure): Awake and Alert Pre-procedure Verification/Time Out Yes - 08:45 Taken: Start Time: 08:46 Pain Control: Lidocaine 4% T opical Solution Percent of Wound Bed Debrided: 100% T Area Debrided (cm): otal 0.75 Tissue and other material debrided: Viable, Non-Viable, Eschar, Slough, Subcutaneous, Skin: Dermis , Skin: Epidermis, Biofilm, Slough Level: Skin/Subcutaneous Tissue Debridement Description: Excisional Instrument: Curette Bleeding: Minimum Hemostasis Achieved: Pressure End Time: 08:55 Procedural Pain: 0 Post Procedural Pain: 0 Response to Treatment: Procedure was tolerated well Level of Consciousness (Post- Awake and Alert procedure): Post Debridement Measurements of  Total Wound Length: (cm) 1.2 Width: (cm) 0.8 Depth: (cm) 0.1 Volume: (cm) 0.075 Character of Wound/Ulcer Post Debridement: Improved Severity of Tissue Post Debridement: Fat layer exposed Post Procedure Diagnosis Same as Pre-procedure Electronic Signature(s) Signed: 01/24/2023 4:07:01 PM By: Geralyn Corwin DO Signed: 01/24/2023 6:06:31 PM By: [name] RN, BSN Entered By: [name] on 01/24/2023 09:02:25 [name] [name] ([id number]) [id number] Page 3 of 10 -------------------------------------------------------------------------------- HPI Details Patient Name: Date of Service: [name] 74, [name] [name]. 01/24/2023 8:00 A [name]  Medical Record Number: [id number] Patient Account Number: [id number] Date of Birth/Sex: Treating RN: 1956-03-18 (67 y.o. [name]) Primary Care Provider: Cranford Mon Other Clinician: Referring Provider: Treating Provider/Extender: Shaune Pollack in Treatment: 4 History of Present Illness HPI Description: 12/24/2022 [name] [name] is a 67 year old male with a past medical history of controlled type 2 diabetes on insulin, lymphedema/chronic venous insufficiency and cirrhosis that presents to the clinic for a 1 year history of nonhealing wounds to his lower extremities bilaterally. He reports the starting spontaneously. He states that he has been following with [facility] and they have been using [facility] antibiotic spray to the legs. He is not wearing compression stockings or wraps. He is also tried Medihoney and collagen in the past with little benefit. He currently denies systemic signs of infection. 5/30; patient presents for follow-up. He has been using triamcinolone cream to the periwound and Santyl to the wound beds. There has been improvement in wound healing. He denies signs of infection and has [name] issues or complaints today. 6/4; both legs look considerably better. He has a wound on the right lateral  lower leg and the left medial lower leg. Significant hemosiderin in the right leg less so on the left. We have been using Hydrofera Blue under compression 6/13; patient presents for follow-up. We have been using antibiotic ointment with Hydrofera Blue under compression therapy. The wounds are smaller. He has [name] issues or complaints today. We discussed ordering juxta lite compression garment wraps T use once his wounds heal as he has hard time putting on o compression stockings. He was agreeable with this. 6/20; patient presents for follow-up. We have been using antibiotic ointment with Hydrofera Blue under compression therapy to the lower extremities bilaterally. Wounds are smaller. He has been approved for PuraPly and was agreeable with placement today. He denies signs of infection. Electronic Signature(s) Signed: 01/24/2023 4:07:01 PM By: Geralyn Corwin DO Entered By: Geralyn Corwin on 01/24/2023 09:17:02 -------------------------------------------------------------------------------- Physical Exam Details Patient Name: Date of Service: [name] [name], [name] [name]. 01/24/2023 8:00 A [name] Medical Record Number: [id number] Patient Account Number: [id number] Date of Birth/Sex: Treating RN: 1955/10/24 (67 y.o. [name]) Primary Care Provider: Cranford Mon Other Clinician: Referring Provider: Treating Provider/Extender: Shaune Pollack in Treatment: 4 Constitutional respirations regular, non-labored and within target range for patient.. Cardiovascular 2+ dorsalis pedis/posterior tibialis pulses. Psychiatric pleasant and cooperative. Notes 2 open wounds to lower extremities bilaterally 1 on each side. On the right there is granulation tissue and nonviable tissue. On the left there is excellent granulation tissue throughout. Good edema control. [name] signs of infection. Electronic Signature(s) Signed: 01/24/2023 4:07:01 PM By: Geralyn Corwin DO Entered By: Geralyn Corwin on 01/24/2023  09:18:11 [name] [name] ([id number]) [id number] Page 4 of 10 -------------------------------------------------------------------------------- Physician Orders Details Patient Name: Date of Service: [name] [name]. 01/24/2023 8:00 A [name] Medical Record Number: [id number] Patient Account Number: [id number] Date of Birth/Sex: Treating RN: 03-01-1956 (67 y.o. [name] [name], [name] Primary Care Provider: Cranford Mon Other Clinician: Referring Provider: Treating Provider/Extender: Shaune Pollack in Treatment: 4 Verbal / Phone Orders: [name] Diagnosis Coding ICD-10 Coding Code Description [phone] Chronic venous hypertension (idiopathic) with ulcer of bilateral lower extremity L97.818 Non-pressure chronic ulcer of other part of right lower leg with other specified severity L97.828 Non-pressure chronic ulcer of other part of left lower leg with other specified severity I89.0 Lymphedema, not elsewhere classified E11.622 Type 2 diabetes  mellitus with other skin ulcer K74.69 Other cirrhosis of liver Follow-up Appointments ppointment in 1 week. - 01/31/2023 Dr. Mikey Bussing 130pm Thursday Return A Return appointment in 3 weeks. - 02/11/2023 0800 Tuesday room 8 Dr. Mikey Bussing Nurse Visit: - Nurse visit Tuesday February 05 799 room 8 Other: - [facility] to send you your juxtalite HDs Anesthetic (In clinic) Topical Lidocaine 4% applied to wound bed Cellular or Tissue Based Products Wound #2 Right,Lateral Lower Leg Cellular or Tissue Based Product Type: - 01/24/2023 #1Puraply AM right leg Cellular or Tissue Based Product applied to wound bed, secured with steri-strips, cover with Adaptic or Mepitel. (DO NOT REMOVE). Bathing/ Shower/ Hygiene May shower with protection but do not get wound dressing(s) wet. Protect dressing(s) with water repellant cover (for example, large plastic bag) or a cast cover and may then take shower. Edema Control - Lymphedema / SCD / Other Elevate  legs to the level of the heart or above for 30 minutes daily and/or when sitting for 3-4 times a day throughout the day. Avoid standing for long periods of time. Exercise regularly Compression stocking or Garment 30-40 mm/Hg pressure to: - Juxtalites HD for both legs. Wound Treatment Wound #1 - Lower Leg Wound Laterality: Left Cleanser: Soap and Water 1 x Per Week/30 Days Discharge Instructions: May shower and wash wound with dial antibacterial soap and water prior to dressing change. Peri-Wound Care: Triamcinolone 15 (g) 1 x Per Week/30 Days Discharge Instructions: Use triamcinolone 15 (g) as directed Peri-Wound Care: Sween Lotion (Moisturizing lotion) 1 x Per Week/30 Days Discharge Instructions: Apply moisturizing lotion as directed Topical: Gentamicin 1 x Per Week/30 Days Discharge Instructions: As directed by physician Topical: Mupirocin Ointment 1 x Per Week/30 Days Discharge Instructions: Apply Mupirocin (Bactroban) as instructed Topical: Triamcinolone 1 x Per Week/30 Days Discharge Instructions: Apply Triamcinolone as directed Prim Dressing: Hydrofera Blue Ready Transfer Foam, 4x5 (in/in) 1 x Per Week/30 Days ary Discharge Instructions: Apply to wound bed as instructed Secondary Dressing: Woven Gauze Sponge, Non-Sterile 4x4 in 1 x Per Week/30 Days [name] [name], [name] [name] ([id number]) [id number] Page 5 of 10 Discharge Instructions: Apply over primary dressing as directed. Secured With: 33M Medipore H Soft Cloth Surgical T ape, 4 x 10 (in/yd) 1 x Per Week/30 Days Discharge Instructions: Secure with tape as directed. Compression Wrap: Urgo K2 Lite, (equivalent to a 3 layer) two layer compression system, regular 1 x Per Week/30 Days Discharge Instructions: Apply Urgo K2 Lite as directed (alternative to 3 layer compression). Compression Wrap: Juxtalite HD 1 x Per Week/30 Days Discharge Instructions: do not apply bring to your appts. Wound #2 - Lower Leg Wound  Laterality: Right, Lateral Cleanser: Soap and Water 1 x Per Week/30 Days Discharge Instructions: May shower and wash wound with dial antibacterial soap and water prior to dressing change. Peri-Wound Care: Triamcinolone 15 (g) 1 x Per Week/30 Days Discharge Instructions: Use triamcinolone 15 (g) as directed Peri-Wound Care: Sween Lotion (Moisturizing lotion) 1 x Per Week/30 Days Discharge Instructions: Apply moisturizing lotion as directed Topical: Gentamicin 1 x Per Week/30 Days Discharge Instructions: As directed by physician Topical: Mupirocin Ointment 1 x Per Week/30 Days Discharge Instructions: Apply Mupirocin (Bactroban) as instructed Prim Dressing: puraply AM 1 x Per Week/30 Days ary Discharge Instructions: applied by provider. Prim Dressing: steristrips and adaptic 1 x Per Week/30 Days ary Discharge Instructions: secure puraply AM in place. Secondary Dressing: Woven Gauze Sponge, Non-Sterile 4x4 in 1 x Per Week/30 Days Discharge Instructions: Apply over primary dressing as directed. Secured With: [employer]  Medipore H Soft Cloth Surgical T ape, 4 x 10 (in/yd) 1 x Per Week/30 Days Discharge Instructions: Secure with tape as directed. Compression Wrap: Urgo K2 Lite, (equivalent to a 3 layer) two layer compression system, regular 1 x Per Week/30 Days Discharge Instructions: Apply Urgo K2 Lite as directed (alternative to 3 layer compression). Compression Wrap: Juxtalite HD 1 x Per Week/30 Days Discharge Instructions: do not apply bring to your appts. Electronic Signature(s) Signed: 01/24/2023 4:07:01 PM By: Geralyn Corwin DO Entered By: Geralyn Corwin on 01/24/2023 09:18:20 -------------------------------------------------------------------------------- Problem List Details Patient Name: Date of Service: [name] [name], [name] [name]. 01/24/2023 8:00 A [name] Medical Record Number: [id number] Patient Account Number: [id number] Date of Birth/Sex: Treating RN: 1956/05/14 (67 y.o. [name] [name] Primary  Care Provider: Cranford Mon Other Clinician: Referring Provider: Treating Provider/Extender: Shaune Pollack in Treatment: 4 Active Problems ICD-10 Encounter Code Description Active Date MDM Diagnosis I87.313 Chronic venous hypertension (idiopathic) with ulcer of bilateral lower extremity 12/24/2022 [name] Yes [name] [name], [name] [name] ([id number]) [id number] Page 6 of 10 L97.818 Non-pressure chronic ulcer of other part of right lower leg with other specified 12/24/2022 [name] Yes severity L97.828 Non-pressure chronic ulcer of other part of left lower leg with other specified 12/24/2022 [name] Yes severity I89.0 Lymphedema, not elsewhere classified 12/24/2022 [name] Yes E11.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 other skin ulcer 12/24/2022 [name] Yes K74.69 Other cirrhosis of liver 12/24/2022 [name] Yes Inactive Problems Resolved Problems Electronic Signature(s) Signed: 01/24/2023 4:07:01 PM By: Geralyn Corwin DO Entered By: Geralyn Corwin on 01/24/2023 09:15:59 -------------------------------------------------------------------------------- Progress Note Details Patient Name: Date of Service: [name] [name], [name] [name]. 01/24/2023 8:00 A [name] Medical Record Number: [id number] Patient Account Number: [id number] Date of Birth/Sex: Treating RN: May 19, 1956 (67 y.o. [name]) Primary Care Provider: Cranford Mon Other Clinician: Referring Provider: Treating Provider/Extender: Shaune Pollack in Treatment: 4 Subjective Chief Complaint Information obtained from Patient 12/24/2022; bilateral lower extremity wounds History of Present Illness (HPI) 12/24/2022 Mr. Giovanne Nickolson is a 67 year old male with a past medical history of controlled type 2 diabetes on insulin, lymphedema/chronic venous insufficiency and cirrhosis that presents to the clinic for a 1 year history of nonhealing wounds to his lower extremities bilaterally. He reports the starting spontaneously. He states that  he has been following with [facility] and they have been using [facility] antibiotic spray to the legs. He is not wearing compression stockings or wraps. He is also tried Medihoney and collagen in the past with little benefit. He currently denies systemic signs of infection. 5/30; patient presents for follow-up. He has been using triamcinolone cream to the periwound and Santyl to the wound beds. There has been improvement in wound healing. He denies signs of infection and has [name] issues or complaints today. 6/4; both legs look considerably better. He has a wound on the right lateral lower leg and the left medial lower leg. Significant hemosiderin in the right leg less so on the left. We have been using Hydrofera Blue under compression 6/13; patient presents for follow-up. We have been using antibiotic ointment with Hydrofera Blue under compression therapy. The wounds are smaller. He has [name] issues or complaints today. We discussed ordering juxta lite compression garment wraps T use once his wounds heal as he has hard time putting on o compression stockings. He was agreeable with this. 6/20; patient presents for follow-up. We have been using antibiotic ointment with Hydrofera Blue under compression therapy to the lower extremities bilaterally.  Wounds are smaller. He has been approved for PuraPly and was agreeable with placement today. He denies signs of infection. Patient History Family History [name] [name], [name] [name] ([id number]) [id number] Page 7 of 10 Diabetes - Father, Heart Disease - Father. Social History Current every day smoker, Marital Status - Single, Alcohol Use - Never, Drug Use - [name] History, Caffeine Use - Never. Medical History Hematologic/Lymphatic Patient has history of Anemia, Lymphedema Cardiovascular Patient has history of Hypertension Gastrointestinal Patient has history of Cirrhosis - crpytogenic Endocrine Patient has history of Type II  Diabetes - 6.2 HgbA1c Medical A Surgical History Notes nd Gastrointestinal GERD Genitourinary stage III CKD Objective Constitutional respirations regular, non-labored and within target range for patient.. Vitals Time Taken: 8:05 AM, Height: 72 in, Weight: 245 lbs, BMI: 33.2, Temperature: 97.8 F, Pulse: 50 bpm, Respiratory Rate: 20 breaths/min, Blood Pressure: 145/71 mmHg, Capillary Blood Glucose: 145 mg/dl. Cardiovascular 2+ dorsalis pedis/posterior tibialis pulses. Psychiatric pleasant and cooperative. General Notes: 2 open wounds to lower extremities bilaterally 1 on each side. On the right there is granulation tissue and nonviable tissue. On the left there is excellent granulation tissue throughout. Good edema control. [name] signs of infection. Integumentary (Hair, Skin) Wound #1 status is Open. Original cause of wound was Gradually Appeared. The date acquired was: 11/05/2022. The wound has been in treatment 4 weeks. The wound is located on the Left Lower Leg. The wound measures 0.2cm length x 0.8cm width x 0.1cm depth; 0.126cm^2 area and 0.013cm^3 volume. There is Fat Layer (Subcutaneous Tissue) exposed. There is [name] tunneling or undermining noted. There is a small amount of serosanguineous drainage noted. The wound margin is distinct with the outline attached to the wound base. There is large (67-100%) pink granulation within the wound bed. There is [name] necrotic tissue within the wound bed. The periwound skin appearance exhibited: Rash. The periwound skin appearance did not exhibit: Callus, Crepitus, Excoriation, Induration, Scarring, Dry/Scaly, Maceration, Atrophie Blanche, Cyanosis, Ecchymosis, Hemosiderin Staining, Mottled, Pallor, Rubor, Erythema. Wound #2 status is Open. Original cause of wound was Gradually Appeared. The date acquired was: 11/07/2022. The wound has been in treatment 4 weeks. The wound is located on the Right,Lateral Lower Leg. The wound measures 1.2cm length x 0.8cm width  x 0.1cm depth; 0.754cm^2 area and 0.075cm^3 volume. There is Fat Layer (Subcutaneous Tissue) exposed. There is [name] tunneling or undermining noted. There is a medium amount of serosanguineous drainage noted. The wound margin is distinct with the outline attached to the wound base. There is large (67-100%) pink, pale granulation within the wound bed. There is a small (1-33%) amount of necrotic tissue within the wound bed including Eschar and Adherent Slough. The periwound skin appearance exhibited: Rash, Dry/Scaly. The periwound skin appearance did not exhibit: Callus, Crepitus, Excoriation, Induration, Scarring, Maceration, Atrophie Blanche, Cyanosis, Ecchymosis, Hemosiderin Staining, Mottled, Pallor, Rubor, Erythema. 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 I debrided nonviable tissue. He was approved for PuraPly and I placed this in standard fashion to the right lower extremity. Continue antibiotic ointment to both wound sites and continue Hydrofera Blue to the left leg all under compression wraps. Follow-up in 1 week.  [name] [name], [name] [name] ([id number]) [id number] Page 8 of 10 Procedures Wound #2 Pre-procedure diagnosis of Wound #2 is a Diabetic Wound/Ulcer of the Lower Extremity located on the Right,Lateral Lower Leg .Severity of Tissue Pre Debridement is: Fat layer exposed. There was a Excisional Skin/Subcutaneous Tissue Debridement with a total area of 0.75 sq cm performed by Geralyn Corwin, DO. With the following instrument(s):  Curette to remove Viable and Non-Viable tissue/material. Material removed includes Eschar, Subcutaneous Tissue, Slough, Skin: Dermis, Skin: Epidermis, and Biofilm after achieving pain control using Lidocaine 4% Topical Solution. A time out was conducted at 08:45, prior to the start of the procedure. A Minimum amount of bleeding was controlled with Pressure. The procedure was tolerated well with a pain level of 0 throughout and a pain level of 0 following the procedure. Post Debridement Measurements: 1.2cm length x 0.8cm width x 0.1cm depth; 0.075cm^3 volume. Character of Wound/Ulcer Post Debridement is improved. Severity of Tissue Post Debridement is: Fat layer exposed. Post procedure Diagnosis Wound #2: Same as Pre-Procedure Pre-procedure diagnosis of Wound #2 is a Diabetic Wound/Ulcer of the Lower Extremity located on the Right,Lateral Lower Leg. A skin graft procedure using a bioengineered skin substitute/cellular or tissue based product was performed by Geralyn Corwin, DO with the following instrument(s): Forceps and Scissors. Puraply AM was applied and secured with Steri-Strips and adaptic. 4 sq cm of product was utilized and 4 sq cm was wasted due to wound size. Post Application, [name] dressing was applied. A Time Out was conducted at 09:00, prior to the start of the procedure. The procedure was tolerated well with a pain level of 0 throughout and a pain level of 0 following the procedure. Post procedure Diagnosis Wound #2: Same as Pre-Procedure . Pre-procedure diagnosis of Wound #2 is a Diabetic Wound/Ulcer of the Lower Extremity located on the Right,Lateral Lower Leg . There was a Double Layer Compression Therapy Procedure by [name], RN. Post procedure Diagnosis Wound #2: Same as Pre-Procedure Wound #1 Pre-procedure diagnosis of Wound #1 is a Diabetic Wound/Ulcer of the Lower Extremity located on the Left Lower Leg . There was a Double Layer Compression Therapy Procedure by [name], RN. Post procedure Diagnosis Wound #1: Same as Pre-Procedure Plan Follow-up Appointments: Return Appointment in 1 week. - 01/31/2023 Dr. Mikey Bussing 130pm Thursday Return appointment in 3 weeks. - 02/11/2023 0800 Tuesday room 8 Dr. Mikey Bussing Nurse Visit: - Nurse visit Tuesday February 05 799 room 8 Other: - [facility] to send you your juxtalite HDs Anesthetic: (In clinic) Topical Lidocaine 4% applied to wound bed Cellular or Tissue Based Products: Wound #2 Right,Lateral Lower Leg: Cellular or Tissue Based Product Type: - 01/24/2023 #1Puraply AM right leg Cellular or Tissue Based Product applied to wound bed, secured with steri-strips, cover with Adaptic or Mepitel. (DO NOT REMOVE). Bathing/ Shower/ Hygiene: May shower with protection but do not get wound dressing(s) wet. Protect dressing(s) with water repellant cover (for example, large plastic bag) or a cast cover and may then take shower. Edema Control - Lymphedema / SCD / Other: Elevate legs to the level of the heart or above for 30 minutes daily and/or when sitting for 3-4 times a day throughout the day. Avoid standing for long periods of time. Exercise regularly Compression stocking or Garment 30-40 mm/Hg pressure to: - Juxtalites HD for both legs. WOUND #1: - Lower Leg Wound Laterality: Left Cleanser: Soap and Water 1 x Per Week/30 Days Discharge Instructions: May shower and wash wound with dial antibacterial soap and water prior  to dressing change. Peri-Wound Care: Triamcinolone 15 (g) 1 x Per Week/30 Days Discharge Instructions: Use triamcinolone 15 (g) as directed Peri-Wound Care: Sween Lotion (Moisturizing lotion) 1 x Per Week/30 Days Discharge Instructions: Apply moisturizing lotion as directed Topical: Gentamicin 1 x Per Week/30 Days Discharge Instructions: As directed by physician Topical: Mupirocin Ointment 1 x Per Week/30 Days Discharge Instructions: Apply Mupirocin (Bactroban) as instructed Topical: Triamcinolone 1 x Per Week/30  Days Discharge Instructions: Apply Triamcinolone as directed Prim Dressing: Hydrofera Blue Ready Transfer Foam, 4x5 (in/in) 1 x Per Week/30 Days ary Discharge Instructions: Apply to wound bed as instructed Secondary Dressing: Woven Gauze Sponge, Non-Sterile 4x4 in 1 x Per Week/30 Days Discharge Instructions: Apply over primary dressing as directed. Secured With: 67M Medipore H Soft Cloth Surgical T ape, 4 x 10 (in/yd) 1 x Per Week/30 Days Discharge Instructions: Secure with tape as directed. Com pression Wrap: Urgo K2 Lite, (equivalent to a 3 layer) two layer compression system, regular 1 x Per Week/30 Days Discharge Instructions: Apply Urgo K2 Lite as directed (alternative to 3 layer compression). Com pression Wrap: Juxtalite HD 1 x Per Week/30 Days Discharge Instructions: do not apply bring to your appts. WOUND #2: - Lower Leg Wound Laterality: Right, Lateral Cleanser: Soap and Water 1 x Per Week/30 Days Discharge Instructions: May shower and wash wound with dial antibacterial soap and water prior to dressing change. Peri-Wound Care: Triamcinolone 15 (g) 1 x Per Week/30 Days Discharge Instructions: Use triamcinolone 15 (g) as directed Peri-Wound Care: Sween Lotion (Moisturizing lotion) 1 x Per Week/30 Days Discharge Instructions: Apply moisturizing lotion as directed Topical: Gentamicin 1 x Per Week/30 Days Discharge Instructions: As directed by physician [name] [name] ([id number]) [id number] Page 9 of 10 Topical: Mupirocin Ointment 1 x Per Week/30 Days Discharge Instructions: Apply Mupirocin (Bactroban) as instructed Prim Dressing: puraply AM 1 x Per Week/30 Days ary Discharge Instructions: applied by provider. Prim Dressing: steristrips and adaptic 1 x Per Week/30 Days ary Discharge Instructions: secure puraply AM in place. Secondary Dressing: Woven Gauze Sponge, Non-Sterile 4x4 in 1 x Per Week/30 Days Discharge Instructions: Apply over primary  dressing as directed. Secured With: 67M Medipore H Soft Cloth Surgical T ape, 4 x 10 (in/yd) 1 x Per Week/30 Days Discharge Instructions: Secure with tape as directed. Com pression Wrap: Urgo K2 Lite, (equivalent to a 3 layer) two layer compression system, regular 1 x Per Week/30 Days Discharge Instructions: Apply Urgo K2 Lite as directed (alternative to 3 layer compression). Com pression Wrap: Juxtalite HD 1 x Per Week/30 Days Discharge Instructions: do not apply bring to your appts. 1. In office sharp debridement 2. PuraPly placed in standard fashion 3. 3 layer compression to lower extremities bilaterally 4. Hydrofera Blue 5. Antibiotic ointment 6. Follow-up in 1 week Electronic Signature(s) Signed: 01/24/2023 4:07:01 PM By: Geralyn Corwin DO Entered By: Geralyn Corwin on 01/24/2023 09:25:35 -------------------------------------------------------------------------------- HxROS Details Patient Name: Date of Service: [name] [name], [name] [name]. 01/24/2023 8:00 A [name] Medical Record Number: [id number] Patient Account Number: [id number] Date of Birth/Sex: Treating RN: [birth year] (67 y.o. [name]) Primary Care Provider: Cranford Mon Other Clinician: Referring Provider: Treating Provider/Extender: Shaune Pollack in Treatment: 4 Hematologic/Lymphatic Medical History: Positive for: Anemia; Lymphedema Cardiovascular Medical History: Positive for: Hypertension Gastrointestinal Medical History: Positive for: Cirrhosis - crpytogenic Past Medical History Notes: GERD Endocrine Medical History: Positive for: Type II Diabetes - 6.2 HgbA1c Time with diabetes: 20 yeatrs Treated with: Insulin Genitourinary Medical History: Past Medical History Notes:  stage III CKD Immunizations Pneumococcal Vaccine: [name] [name], [name] [name] ([id number]) [id number] Page 10 of 10 Received Pneumococcal Vaccination: [name] Implantable Devices [name] devices added Family and Social  History Diabetes: Yes - Father; Heart Disease: Yes - Father; Current every day smoker; Marital Status - Single; Alcohol Use: Never; Drug Use: [name] History; Caffeine Use: Never; Financial Concerns: [name]; Food, Clothing or Shelter Needs: [name]; Support System Lacking: [name]; Transportation Concerns: [name] Electronic Signature(s) Signed: 01/24/2023 4:07:01 PM By: Geralyn Corwin DO Entered By: Geralyn Corwin on 01/24/2023 09:17:08 -------------------------------------------------------------------------------- SuperBill Details Patient Name: Date of Service: [name] [name] RRIS [name]. 01/24/2023 Medical Record Number: [id number] Patient Account Number: [id number] Date of Birth/Sex: Treating RN: [birth year] (67 y.o. 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 L97.828 Non-pressure chronic ulcer of other part of left lower leg with other specified severity I89.0 Lymphedema, not elsewhere classified E11.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 other skin ulcer K74.69 Other cirrhosis of liver Facility Procedures : CPT4 Code: 21308657 Description: Q4196 - PuraPly Product AM 2X4 (8 sq cm) Modifier: Quantity: 8 : CPT4 Code: 84696295 Description: 15271 - SKIN SUB GRAFT TRNK/ARM/LEG ICD-10 Diagnosis Description L97.818 Non-pressure chronic ulcer of other part of right lower leg with other specified seve I87.313 Chronic venous hypertension (idiopathic) with ulcer of bilateral lower  extremity E11.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 other skin ulcer Modifier: rity Quantity: 1 : CPT4 Code: 28413244 Description: (Facility Use Only) 29581LT - APPLY MULTLAY COMPRS LWR LT  LEG Modifier: 59 Quantity: 1 Physician Procedures : CPT4 Code Description Modifier [id number] 15271 - WC PHYS SKIN SUB GRAFT TRNK/ARM/LEG ICD-10 Diagnosis Description L97.818 Non-pressure chronic ulcer of other part of right lower leg with other specified severity I87.313 Chronic venous hypertension  (idiopathic) with ulcer of bilateral lower extremity E11.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 other skin ulcer Quantity: 1 Electronic Signature(s) Signed: 01/24/2023 4:07:01 PM By: Geralyn Corwin DO Entered By: Geralyn Corwin on 01/24/2023 09:27:59
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
## 2023-01-31 ENCOUNTER — Encounter (HOSPITAL_BASED_OUTPATIENT_CLINIC_OR_DEPARTMENT_OTHER): Payer: Medicare Other | Admitting: Internal Medicine

## 2023-01-31 ENCOUNTER — Other Ambulatory Visit: Payer: Self-pay | Admitting: Internal Medicine

## 2023-01-31 DIAGNOSIS — Z1289 Encounter for screening for malignant neoplasm of other sites: Secondary | ICD-10-CM

## 2023-01-31 DIAGNOSIS — E11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 other skin ulcer: Secondary | ICD-10-CM | POA: Diagnosis not present

## 2023-01-31 DIAGNOSIS — L97818 Non-pressure chronic ulcer of other part of right lower leg with other specified severity: Secondary | ICD-10-CM | POA: Diagnosis not present

## 2023-01-31 DIAGNOSIS — I890 Lymphedema, not elsewhere classified: Secondary | ICD-10-CM

## 2023-01-31 DIAGNOSIS — I87313 Chronic venous hypertension (idiopathic) with ulcer of bilateral lower extremity: Secondary | ICD-10-CM | POA: Diagnosis not present

## 2023-02-01 NOTE — Progress Notes (Signed)
JEKHI, MCNIVEN ([id number]) [id number] Page 1 of 11 Visit Report for 01/31/2023 Arrival Information Details Patient Name: Date of Service: [name] [name] [name]. 01/31/2023 1:30 PM Medical Record Number: [id number] Patient Account Number: [id number] Date of Birth/Sex: Treating RN: Aug 16, 1955 (67 y.o. Roel Cluck Primary Care Stevana Dufner: [place] Other Clinician: Referring Mella Inclan: Treating Hargis Vandyne/Extender: Shaune Pollack in Treatment: 5 Visit Information History Since Last Visit Added or deleted any medications: [name] Patient Arrived: Ambulatory Any new allergies or adverse reactions: [name] Arrival Time: 13:39 Has Dressing in Place as Prescribed: Yes Accompanied By: wife Pain Present Now: [name] Transfer Assistance: None Patient Identification Verified: Yes Secondary Verification Process Completed: Yes Patient Requires Transmission-Based [name] Precautions: Patient Has Alerts: Yes Patient Alerts: 10/23 ABI L0.94 R1 VVS 10/23 TBI L1.2 R0.88 VVS Electronic Signature(s) Signed: 02/01/2023 1:07:25 PM By: Midge Aver MSN RN CNS WTA Entered By: Midge Aver on 01/31/2023 13:41:07 -------------------------------------------------------------------------------- Clinic Level of Care Assessment Details Patient Name: Date of Service: [name] [name] Donath RRIS M. 01/31/2023 1:30 PM Medical Record Number: [id number] Patient Account Number: [id number] Date of Birth/Sex: Treating RN: May 26, 1956 (67 y.o. Roel Cluck Primary Care Johntay Doolen: [place] Other Clinician: Referring Clorene Nerio: Treating Jaydeen Odor/Extender: Shaune Pollack in Treatment: 5 Clinic Level of Care Assessment Items TOOL 1 Quantity Score []  - 0 Use when EandM and Procedure is performed on INITIAL visit ASSESSMENTS - Nursing Assessment / Reassessment []  - 0 General Physical Exam (combine w/ comprehensive assessment (listed just below) when performed on new pt.  evals) []  - 0 Comprehensive Assessment (HX, ROS, Risk Assessments, Wounds Hx, etc.) ASSESSMENTS - Wound and Skin Assessment / Reassessment []  - 0 Dermatologic / Skin Assessment (not related to wound area) ASSESSMENTS - Ostomy and/or Continence Assessment and Care []  - 0 Incontinence Assessment and Management []  - 0 Ostomy Care Assessment and Management (repouching, etc.) PROCESS - Coordination of Care []  - 0 Simple Patient / Family Education for ongoing care []  - 0 Complex (extensive) Patient / Family Education for ongoing care []  - 0 Staff obtains Consents, Records, T Results / Process Orders est TAGG, PEREZGARCIA ([id number]) [id number] Page 2 of 11 []  - 0 Staff telephones HHA, Nursing Homes / Clarify orders / etc []  - 0 Routine Transfer to another Facility (non-emergent condition) []  - 0 Routine Hospital Admission (non-emergent condition) []  - 0 New Admissions / Manufacturing engineer / Ordering NPWT Apligraf, etc. , []  - 0 Emergency Hospital Admission (emergent condition) PROCESS - Special Needs []  - 0 Pediatric / Minor Patient Management []  - 0 Isolation Patient Management []  - 0 Hearing / Language / Visual special needs []  - 0 Assessment of Community assistance (transportation, D/C planning, etc.) []  - 0 Additional assistance / Altered mentation []  - 0 Support Surface(s) Assessment (bed, cushion, seat, etc.) INTERVENTIONS - Miscellaneous []  - 0 External ear exam []  - 0 Patient Transfer (multiple staff / Nurse, adult / Similar devices) []  - 0 Simple Staple / Suture removal (25 or less) []  - 0 Complex Staple / Suture removal (26 or more) []  - 0 Hypo/Hyperglycemic Management (do not check if billed separately) []  - 0 Ankle / Brachial Index (ABI) - do not check if billed separately Has the patient been seen at the hospital within the last three years: Yes Total Score: 0 Level Of Care: ____ Electronic Signature(s) Signed:  02/01/2023 1:07:25 PM By: Midge Aver MSN RN CNS WTA Entered By: Midge Aver on 01/31/2023 14:41:32 --------------------------------------------------------------------------------  Compression Therapy Details Patient Name: Date of Service: [name] Sieving. 01/31/2023 1:30 PM Medical Record Number: [id number] Patient Account Number: [id number] Date of Birth/Sex: Treating RN: Feb 10, 1956 (67 y.o. Roel Cluck Primary Care Talyah Seder: [place] Other Clinician: Referring Hiliary Osorto: Treating Ethne Jeon/Extender: Shaune Pollack in Treatment: 5 Compression Therapy Performed for Wound Assessment: Wound #2 Right,Lateral Lower Leg Performed By: Clinician Midge Aver, RN Compression Type: Three Layer Post Procedure Diagnosis Same as Pre-procedure Electronic Signature(s) Signed: 02/01/2023 1:07:25 PM By: Midge Aver MSN RN CNS WTA Entered By: Midge Aver on 01/31/2023 14:11:32 Cay Schillings ([id number]) [id number] Page 3 of 11 -------------------------------------------------------------------------------- Compression Therapy Details Patient Name: Date of Service: [name] Sieving. 01/31/2023 1:30 PM Medical Record Number: [id number] Patient Account Number: [id number] Date of Birth/Sex: Treating RN: 08-28-1955 (67 y.o. Roel Cluck Primary Care Artelia Game: [place] Other Clinician: Referring Kelicia Youtz: Treating Sanjuanita Condrey/Extender: Shaune Pollack in Treatment: 5 Compression Therapy Performed for Wound Assessment: Wound #3 Right,Lateral Lower Leg Performed By: Clinician Midge Aver, RN Compression Type: Three Layer Post Procedure Diagnosis Same as Pre-procedure Electronic Signature(s) Signed: 02/01/2023 1:07:25 PM By: Midge Aver MSN RN CNS WTA Entered By: Midge Aver on 01/31/2023 14:11:33 -------------------------------------------------------------------------------- Encounter Discharge Information  Details Patient Name: Date of Service: [name] [name], [name] RRIS M. 01/31/2023 1:30 PM Medical Record Number: [id number] Patient Account Number: [id number] Date of Birth/Sex: Treating RN: Mar 18, 1956 (67 y.o. Roel Cluck Primary Care Ceonna Frazzini: [place] Other Clinician: Referring Shawntee Mainwaring: Treating Saralyn Willison/Extender: Shaune Pollack in Treatment: 5 Encounter Discharge Information Items Post Procedure Vitals Discharge Condition: Stable Temperature (F): 97.9 Ambulatory Status: Ambulatory Pulse (bpm): 54 Discharge Destination: Home Respiratory Rate (breaths/min): 18 Transportation: Private Auto Blood Pressure (mmHg): 153/66 Accompanied By: Maricela Curet Schedule Follow-up Appointment: Yes Clinical Summary of Care: Provided Form Type Recipient Paper Patient PATIENT Electronic Signature(s) Signed: 02/01/2023 1:07:25 PM By: Midge Aver MSN RN CNS WTA Entered By: Midge Aver on 01/31/2023 14:42:34 -------------------------------------------------------------------------------- Lower Extremity Assessment Details Patient Name: Date of Service: [name] [name] [name], [name] RRIS M. 01/31/2023 1:30 PM Medical Record Number: [id number] Patient Account Number: [id number] Date of Birth/Sex: Treating RN: [birth year] (67 y.o. Roel Cluck Primary Care Soliyana Mcchristian: [place] Other Clinician: Referring Robena Ewy: Treating Caesar Mannella/Extender: Shaune Pollack in Treatment: [address] Milus Height ([id number]) [id number] Page 4 of 11 Edema Assessment Assessed: [Left: [name]] [Right: [name]] Edema: [Left: Yes] [Right: Yes] Calf Left: Right: Point of Measurement: 37 cm From Medial Instep 33 cm 33 cm Ankle Left: Right: Point of Measurement: 13 cm From Medial Instep 22 cm 18 cm Electronic Signature(s) Signed: 02/01/2023 1:07:25 PM By: Midge Aver MSN RN CNS WTA Entered By: Midge Aver on 01/31/2023  13:43:23 -------------------------------------------------------------------------------- Multi Wound Chart Details Patient Name: Date of Service: [name] [name], [name] Barry RRIS M. 01/31/2023 1:30 PM Medical Record Number: [id number] Patient Account Number: [id number] Date of Birth/Sex: Treating RN: [birth year] (67 y.o. M) Primary Care Jadiel Schmieder: [place] Mon Other Clinician: Referring Wandra Babin: Treating Kailani Brass/Extender: Shaune Pollack in Treatment: 5 Vital Signs Height(in): 72 Pulse(bpm): 54 Weight(lbs): 245 Blood Pressure(mmHg): 153/66 Body Mass Index(BMI): 33.2 Temperature(F): 97.9 Respiratory Rate(breaths/min): 18 [1:Photos:] [3:[name] Photos] Left Lower Leg Right, Lateral Lower Leg Right, Lateral Lower Leg Wound Location: Gradually Appeared Gradually Appeared Gradually Appeared Wounding Event: Diabetic Wound/Ulcer of the Lower Diabetic Wound/Ulcer of the Lower Diabetic Wound/Ulcer of the Lower Primary Etiology: Extremity Extremity Extremity Lymphedema Lymphedema N/A Secondary  Etiology: Anemia, Lymphedema, Hypertension, Anemia, Lymphedema, Hypertension, Anemia, Lymphedema, Hypertension, Comorbid History: Cirrhosis , Type II Diabetes Cirrhosis , Type II Diabetes Cirrhosis , Type II Diabetes 11/05/2022 11/07/2022 01/25/2023 Date Acquired: 5 5 0 Weeks of Treatment: Healed - Epithelialized Open Open Wound Status: [name] [name] [name] Wound Recurrence: Yes Yes [name] Clustered Wound: 0 1 N/A Clustered Quantity: 0x0x0 0.2x0.2x0.1 0.3x0.3x0.2 Measurements L x W x D (cm) 0 0.031 0.071 A (cm) : rea 0 0.003 0.014 Volume (cm) : 100.00% 99.80% N/A % Reduction in A rea: 100.00% 99.90% N/A % Reduction in Volume: Grade 1 Grade 2 Grade 1 Classification: Small Medium Medium Exudate A mount: Serosanguineous Serosanguineous Serosanguineous Exudate Type: red, brown red, brown red, brown Exudate ColorJAVIAN, BART ([id number]) [id number] Page 5 of  11 Distinct, outline attached Distinct, outline attached N/A Wound Margin: Large (67-100%) Large (67-100%) Small (1-33%) Granulation Amount: Pink Pink, Pale Red Granulation Quality: None Present (0%) Small (1-33%) Small (1-33%) Necrotic Amount: N/A Eschar Adherent Slough Necrotic Tissue: Fat Layer (Subcutaneous Tissue): Yes Fat Layer (Subcutaneous Tissue): Yes Fat Layer (Subcutaneous Tissue): Yes Exposed Structures: Fascia: [name] Fascia: [name] Fascia: [name] Tendon: [name] Tendon: [name] Tendon: [name] Muscle: [name] Muscle: [name] Muscle: [name] Joint: [name] Joint: [name] Joint: [name] Bone: [name] Bone: [name] Bone: [name] Large (67-100%) Medium (34-66%) N/A Epithelialization: Rash: Yes Rash: Yes [name] Abnormalities Noted Periwound Skin Texture: Excoriation: [name] Excoriation: [name] Induration: [name] Induration: [name] Callus: [name] Callus: [name] Crepitus: [name] Crepitus: [name] Scarring: [name] Scarring: [name] Maceration: [name] Dry/Scaly: Yes Maceration: [name] Periwound Skin Moisture: Dry/Scaly: [name] Maceration: [name] Dry/Scaly: [name] Atrophie Blanche: [name] Atrophie Blanche: [name] [name] Abnormalities Noted Periwound Skin Color: Cyanosis: [name] Cyanosis: [name] Ecchymosis: [name] Ecchymosis: [name] Erythema: [name] Erythema: [name] Hemosiderin Staining: [name] Hemosiderin Staining: [name] Mottled: [name] Mottled: [name] Pallor: [name] Pallor: [name] Rubor: [name] Rubor: [name] N/A Cellular or Tissue Based Product Compression Therapy Procedures Performed: Compression Therapy Treatment Notes Electronic Signature(s) Signed: 01/31/2023 4:14:51 PM By: Geralyn Corwin DO Entered By: Geralyn Corwin on 01/31/2023 14:20:50 -------------------------------------------------------------------------------- Multi-Disciplinary Care Plan Details Patient Name: Date of Service: [name] [name], [name] Barry RRIS M. 01/31/2023 1:30 PM Medical Record Number: [id number] Patient Account Number: [id number] Date of Birth/Sex: Treating RN: 10-27-1955 (67 y.o. Roel Cluck Primary Care Marquasia Schmieder: [place] Other Clinician: Referring  Atha Muradyan: Treating Karime Scheuermann/Extender: Shaune Pollack in Treatment: 5 Active Inactive Pain, Acute or Chronic Nursing Diagnoses: Pain, acute or chronic: actual or potential Potential alteration in comfort, pain Goals: Patient will verbalize adequate pain control and receive pain control interventions during procedures as needed Date Initiated: 12/24/2022 Target Resolution Date: 02/15/2023 Goal Status: Active Patient/caregiver will verbalize comfort level met Date Initiated: 12/24/2022 Date Inactivated: 01/31/2023 Target Resolution Date: 02/14/2023 Goal Status: Met Interventions: Encourage patient to take pain medications as prescribed Provide education on pain management Treatment Activities: Administer pain control measures as ordered : 12/24/2022 [name] [name], [name] [name] ([id number]) [id number] Page 6 of 11 Notes: Electronic Signature(s) Signed: 02/01/2023 1:07:25 PM By: Midge Aver MSN RN CNS WTA Entered By: Midge Aver on 01/31/2023 14:15:54 -------------------------------------------------------------------------------- Pain Assessment Details Patient Name: Date of Service: [name] [name], [name] RRIS M. 01/31/2023 1:30 PM Medical Record Number: [id number] Patient Account Number: [id number] Date of Birth/Sex: Treating RN: 26-Dec-1955 (67 y.o. Roel Cluck Primary Care Emmerson Shuffield: [place] Other Clinician: Referring Kennie Snedden: Treating Caidin Heidenreich/Extender: Shaune Pollack in Treatment: 5 Active Problems Location of Pain Severity and Description of Pain Patient Has Paino [name] Site Locations Pain Management and Medication Current Pain  Management: Electronic Signature(s) Signed: 02/01/2023 1:07:25 PM By: Midge Aver MSN RN CNS WTA Entered By: Midge Aver on 01/31/2023 13:43:17 -------------------------------------------------------------------------------- Patient/Caregiver Education Details Patient Name: Date of Service: [name]  [name] [name] 6/27/2024andnbsp1:30 PM Medical Record Number: [id number] Patient Account Number: [id number] Date of Birth/Gender: Treating RN: May 03, 1956 (67 y.o. Roel Cluck Primary Care Physician: [place] Other Clinician: Referring Physician: Treating Physician/Extender: Shaune Pollack in Treatment: 5 Education [address] DEUNTAY, FRIESEN [place]more ([id number]) [id number] Page 7 of 11 Education Provided To: Patient Education Topics Provided Wound/Skin Impairment: Handouts: Caring for Your Ulcer Methods: Explain/Verbal Responses: State content correctly Electronic Signature(s) Signed: 02/01/2023 1:07:25 PM By: Midge Aver MSN RN CNS WTA Entered By: Midge Aver on 01/31/2023 14:41:21 -------------------------------------------------------------------------------- Wound Assessment Details Patient Name: Date of Service: [name] [name], [name] Barry RRIS M. 01/31/2023 1:30 PM Medical Record Number: [id number] Patient Account Number: [id number] Date of Birth/Sex: Treating RN: 1956-06-04 (67 y.o. Roel Cluck Primary Care Brealynn Contino: [place] Other Clinician: Referring Zahara Rembert: Treating Annaliese Saez/Extender: Lanney Gins Weeks in Treatment: 5 Wound Status Wound Number: 1 Primary Etiology: Diabetic Wound/Ulcer of the Lower Extremity Wound Location: Left Lower Leg Secondary Lymphedema Etiology: Wounding Event: Gradually Appeared Wound Status: Healed - Epithelialized Date Acquired: 11/05/2022 Comorbid Anemia, Lymphedema, Hypertension, Cirrhosis , Type II Weeks Of Treatment: 5 History: Diabetes Clustered Wound: Yes Photos Wound Measurements Length: (cm) Width: (cm) Depth: (cm) Clustered Quantity: Area: (cm) Volume: (cm) 0 % Reduction in Area: 100% 0 % Reduction in Volume: 100% 0 Epithelialization: Large (67-100%) 0 0 0 Wound Description Classification: Grade 1 Wound Margin: Distinct, outline attached Exudate Amount:  Small Exudate Type: Serosanguineous Exudate Color: red, brown Foul Odor After Cleansing: [name] Slough/Fibrino Yes Wound Bed Granulation Amount: Large (67-100%) Exposed Structure Granulation Quality: Pink Fascia Exposed: [name] Necrotic Amount: None Present (0%) Fat Layer (Subcutaneous Tissue) Exposed: Yes Tendon Exposed: [name] [name] [name], [name] [name] ([id number]) [id number] Page 8 of 11 Muscle Exposed: [name] Joint Exposed: [name] Bone Exposed: [name] Periwound Skin Texture Texture Color [name] Abnormalities Noted: [name] [name] Abnormalities Noted: [name] Callus: [name] Atrophie Blanche: [name] Crepitus: [name] Cyanosis: [name] Excoriation: [name] Ecchymosis: [name] Induration: [name] Erythema: [name] Rash: Yes Hemosiderin Staining: [name] Scarring: [name] Mottled: [name] Pallor: [name] Moisture Rubor: [name] [name] Abnormalities Noted: [name] Dry / Scaly: [name] Maceration: [name] Treatment Notes Wound #1 (Lower Leg) Wound Laterality: Left Cleanser Peri-Wound Care Topical Primary Dressing Secondary Dressing Secured With Compression Wrap Compression Stockings Add-Ons Electronic Signature(s) Signed: 02/01/2023 1:07:25 PM By: Midge Aver MSN RN CNS WTA Entered By: Midge Aver on 01/31/2023 14:06:49 -------------------------------------------------------------------------------- Wound Assessment Details Patient Name: Date of Service: [name] [name], [name] Barry RRIS M. 01/31/2023 1:30 PM Medical Record Number: [id number] Patient Account Number: [id number] Date of Birth/Sex: Treating RN: Jul 31, 1956 (67 y.o. Roel Cluck Primary Care Mersadies Petree: [place] Other Clinician: Referring Teyah Rossy: Treating Princessa Lesmeister/Extender: Lanney Gins Weeks in Treatment: 5 Wound Status Wound Number: 2 Primary Etiology: Diabetic Wound/Ulcer of the Lower Extremity Wound Location: Right, Lateral Lower Leg Secondary Lymphedema Etiology: Wounding Event: Gradually Appeared Wound Status: Open Date Acquired: 11/07/2022 Comorbid Anemia, Lymphedema, Hypertension,  Cirrhosis , Type II Weeks Of Treatment: 5 History: Diabetes Clustered Wound: [id number] [place], [name] [name] ([id number]) [id number] Page 9 of 11 Wound Measurements Length: (cm) Width: (cm) Depth: (cm) Clustered Quantity: Area: (cm) Volume: (cm) 0.2 % Reduction in Area: 99.8% 0.2 % Reduction in Volume: 99.9% 0.1 Epithelialization: Medium (34-66%) 1 0.031 0.003 Wound Description Classification: Grade 2 Wound Margin: Distinct,  outline attached Exudate Amount: Medium Exudate Type: Serosanguineous Exudate Color: red, brown Foul Odor After Cleansing: [name] Slough/Fibrino Yes Wound Bed Granulation Amount: Large (67-100%) Exposed Structure Granulation Quality: Pink, Pale Fascia Exposed: [name] Necrotic Amount: Small (1-33%) Fat Layer (Subcutaneous Tissue) Exposed: Yes Necrotic Quality: Eschar Tendon Exposed: [name] Muscle Exposed: [name] Joint Exposed: [name] Bone Exposed: [name] Periwound Skin Texture Texture Color [name] Abnormalities Noted: [name] [name] Abnormalities Noted: [name] Callus: [name] Atrophie Blanche: [name] Crepitus: [name] Cyanosis: [name] Excoriation: [name] Ecchymosis: [name] Induration: [name] Erythema: [name] Rash: Yes Hemosiderin Staining: [name] Scarring: [name] Mottled: [name] Pallor: [name] Moisture Rubor: [name] [name] Abnormalities Noted: [name] Dry / Scaly: Yes Maceration: [name] Treatment Notes Wound #2 (Lower Leg) Wound Laterality: Right, Lateral Cleanser Soap and Water Discharge Instruction: May shower and wash wound with dial antibacterial soap and water prior to dressing change. Peri-Wound Care Triamcinolone 15 (g) Discharge Instruction: Use triamcinolone 15 (g) as directed Sween Lotion (Moisturizing lotion) Discharge Instruction: Apply moisturizing lotion as directed Topical Primary Dressing puraply AM Discharge Instruction: applied by Brin Ruggerio. steristrips and adaptic Discharge Instruction: secure puraply AM in place. [name] [name], [name] [name] ([id number]) [id number] Page 10  of 11 Secondary Dressing Woven Gauze Sponge, Non-Sterile 4x4 in Discharge Instruction: Apply over primary dressing as directed. Secured With Compression Wrap Urgo K2 Lite, (equivalent to a 3 layer) two layer compression system, regular Discharge Instruction: Apply Urgo K2 Lite as directed (alternative to 3 layer compression). Compression Stockings Add-Ons Electronic Signature(s) Signed: 02/01/2023 1:07:25 PM By: Midge Aver MSN RN CNS WTA Entered By: Midge Aver on 01/31/2023 14:03:37 -------------------------------------------------------------------------------- Wound Assessment Details Patient Name: Date of Service: [name] [name], [name] RRIS M. 01/31/2023 1:30 PM Medical Record Number: [id number] Patient Account Number: [id number] Date of Birth/Sex: Treating RN: [birth year] (67 y.o. Roel Cluck Primary Care Maudell Stanbrough: [place] Mon Other Clinician: Referring Shakara Tweedy: Treating Ananth Fiallos/Extender: Lanney Gins Weeks in Treatment: 5 Wound Status Wound Number: 3 Primary Diabetic Wound/Ulcer of the Lower Extremity Etiology: Wound Location: Right, Lateral Lower Leg Wound Status: Open Wounding Event: Gradually Appeared Comorbid Anemia, Lymphedema, Hypertension, Cirrhosis , Type II Date Acquired: 01/25/2023 History: Diabetes Weeks Of Treatment: 0 Clustered Wound: [name] Wound Measurements Length: (cm) Width: (cm) Depth: (cm) Area: (cm) Volume: (cm) 0.3 % Reduction in Area: 0.3 % Reduction in Volume: 0.2 0.071 0.014 Wound Description Classification: Grade 1 Exudate Amount: Medium Exudate Type: Serosanguineous Exudate Color: red, brown Foul Odor After Cleansing: [name] Slough/Fibrino Yes Wound Bed Granulation Amount: Small (1-33%) Exposed Structure Granulation Quality: Red Fascia Exposed: [name] Necrotic Amount: Small (1-33%) Fat Layer (Subcutaneous Tissue) Exposed: Yes Necrotic Quality: Adherent Slough Tendon Exposed: [name] Muscle Exposed: [name] Joint Exposed: [name] Bone  Exposed: [name] Periwound Skin Texture Texture Color [name] Abnormalities Noted: Yes [name] Abnormalities Noted: Yes Moisture [name] Abnormalities Noted: [name] Dry / Scaly: [name] Maceration: [name] GARDELL, FARINO ([id number]) [id number] Page 11 of 11 Treatment Notes Wound #3 (Lower Leg) Wound Laterality: Right, Lateral Cleanser Soap and Water Discharge Instruction: May shower and wash wound with dial antibacterial soap and water prior to dressing change. Peri-Wound Care Triamcinolone 15 (g) Discharge Instruction: Use triamcinolone 15 (g) as directed Sween Lotion (Moisturizing lotion) Discharge Instruction: Apply moisturizing lotion as directed Topical Primary Dressing puraply AM Discharge Instruction: applied by Nakea Gouger. steristrips and adaptic Discharge Instruction: secure puraply AM in place. Secondary Dressing Woven Gauze Sponge, Non-Sterile 4x4 in Discharge Instruction: Apply over primary dressing as directed. Secured With Compression Wrap Urgo K2 Lite, (equivalent to a 3 layer) two layer compression system, regular Discharge  Instruction: Apply Urgo K2 Lite as directed (alternative to 3 layer compression). Compression Stockings Add-Ons Electronic Signature(s) Signed: 02/01/2023 1:07:25 PM By: Midge Aver MSN RN CNS WTA Entered By: Midge Aver on 01/31/2023 14:06:09 -------------------------------------------------------------------------------- Vitals Details Patient Name: Date of Service: [name] [name], [name] RRIS M. 01/31/2023 1:30 PM Medical Record Number: [id number] Patient Account Number: [id number] Date of Birth/Sex: Treating RN: Aug 06, 1956 (67 y.o. Roel Cluck Primary Care Virginio Isidore: [place] Mon Other Clinician: Referring Janell Keeling: Treating Josi Roediger/Extender: Shaune Pollack in Treatment: 5 Vital Signs Time Taken: 13:41 Temperature (F): 97.9 Height (in): 72 Pulse (bpm): 54 Weight (lbs): 245 Respiratory Rate (breaths/min): 18 Body  Mass Index (BMI): 33.2 Blood Pressure (mmHg): 153/66 Reference Range: 80 - 120 mg / dl Electronic Signature(s) Signed: 02/01/2023 1:07:25 PM By: Midge Aver MSN RN CNS WTA Entered By: Midge Aver on 01/31/2023 13:43:09

## 2023-02-04 NOTE — Progress Notes (Signed)
[name], [name] ([id number]) [id number] Page 1 of 9 Visit Report for 12/24/2022 Chief Complaint Document Details Patient Name: Date of Service: [name] [name]. 12/24/2022 10:30 A [name] Medical Record Number: [id number] Patient Account Number: [id number] Date of Birth/Sex: Treating RN: Sep 27, 1955 (67 y.o. [name]) Primary Care Provider: Cranford Mon Other Clinician: Referring Provider: Treating Provider/Extender: Lanney Gins Weeks in Treatment: 0 Information Obtained from: Patient Chief Complaint 12/24/2022; bilateral lower extremity wounds Electronic Signature(s) Signed: 12/24/2022 2:02:21 PM By: Geralyn Corwin DO Entered By: Geralyn Corwin on 12/24/2022 11:29:34 -------------------------------------------------------------------------------- Debridement Details Patient Name: Date of Service: [name] [name]. 12/24/2022 10:30 A [name] Medical Record Number: [id number] Patient Account Number: [id number] Date of Birth/Sex: Treating RN: 03-16-1956 (67 y.o. [name] [name] Primary Care Provider: Cranford Mon Other Clinician: Referring Provider: Treating Provider/Extender: Shaune Pollack in Treatment: 0 Debridement Performed for Assessment: Wound #1 Left Lower Leg Performed By: Clinician [name], RN Debridement Type: Chemical/Enzymatic/Mechanical Agent Used: Santyl Severity of Tissue Pre Debridement: Fat layer exposed Level of Consciousness (Pre-procedure): Awake and Alert Pre-procedure Verification/Time Out [name] Taken: Percent of Wound Bed Debrided: Bleeding: None Response to Treatment: Procedure was tolerated well Level of Consciousness (Post- Awake and Alert procedure): Post Debridement Measurements of Total Wound Length: (cm) 3.5 Width: (cm) 2 Depth: (cm) 0.2 Volume: (cm) 1.1 Character of Wound/Ulcer Post Debridement: Requires Further Debridement Severity of Tissue Post Debridement: Fat layer exposed Post  Procedure Diagnosis Same as Pre-procedure Electronic Signature(s) Signed: 12/24/2022 2:02:21 PM By: Geralyn Corwin DO Signed: 12/24/2022 6:19:31 PM By: [name] RN, BSN [place], Milus Height ([id number]) [id number] Page 2 of 9 Entered By: [name] on 12/24/2022 11:25:42 -------------------------------------------------------------------------------- Debridement Details Patient Name: Date of Service: [name] [name]. 12/24/2022 10:30 A [name] Medical Record Number: [id number] Patient Account Number: [id number] Date of Birth/Sex: Treating RN: 1956-06-07 (67 y.o. [name] [name] Primary Care Provider: Cranford Mon Other Clinician: Referring Provider: Treating Provider/Extender: Shaune Pollack in Treatment: 0 Debridement Performed for Assessment: Wound #2 Right,Lateral Lower Leg Performed By: Clinician [name], RN Debridement Type: Chemical/Enzymatic/Mechanical Agent Used: Santyl Severity of Tissue Pre Debridement: Fat layer exposed Level of Consciousness (Pre-procedure): Awake and Alert Pre-procedure Verification/Time Out [name] Taken: Percent of Wound Bed Debrided: Bleeding: None Response to Treatment: Procedure was tolerated well Level of Consciousness (Post- Awake and Alert procedure): Post Debridement Measurements of Total Wound Length: (cm) 3.5 Width: (cm) 5 Depth: (cm) 0.2 Volume: (cm) 2.749 Character of Wound/Ulcer Post Debridement: Requires Further Debridement Severity of Tissue Post Debridement: Fat layer exposed Post Procedure Diagnosis Same as Pre-procedure Electronic Signature(s) Signed: 12/24/2022 2:02:21 PM By: Geralyn Corwin DO Signed: 12/24/2022 6:19:31 PM By: [name] RN, BSN Entered By: [name] on 12/24/2022 11:25:57 -------------------------------------------------------------------------------- HPI Details Patient Name: Date of Service: [name] [name], [name] [name]. 12/24/2022 10:30 A [name] Medical  Record Number: [id number] Patient Account Number: [id number] Date of Birth/Sex: Treating RN: 1955-10-24 (67 y.o. [name]) Primary Care Provider: Cranford Mon Other Clinician: Referring Provider: Treating Provider/Extender: Shaune Pollack in Treatment: 0 History of Present Illness HPI Description: 12/24/2022 [name] [name] is a 67 year old male with a past medical history of controlled type 2 diabetes on insulin, lymphedema/chronic venous insufficiency and cirrhosis that presents to the clinic for a 1 year history of nonhealing wounds to his lower extremities bilaterally. He reports the starting spontaneously. He states that he has been following with [facility] wound care  center and they have been using [facility] antibiotic spray to the legs. He is not wearing compression stockings or wraps. He is also tried Medihoney and collagen in the past with little benefit. He currently denies systemic signs of infection. Electronic Signature(s) [name] [name] Minong ([id number]) [id number] Page 3 of 9 Signed: 12/24/2022 2:02:21 PM By: Geralyn Corwin DO Entered By: Geralyn Corwin on 12/24/2022 11:31:19 -------------------------------------------------------------------------------- Physical Exam Details Patient Name: Date of Service: [name] 57, [name] [name]. 12/24/2022 10:30 A [name] Medical Record Number: [id number] Patient Account Number: [id number] Date of Birth/Sex: Treating RN: 01/29/1956 (67 y.o. [name]) Primary Care Provider: Cranford Mon Other Clinician: Referring Provider: Treating Provider/Extender: Lanney Gins Weeks in Treatment: 0 Constitutional respirations regular, non-labored and within target range for patient.. Cardiovascular 2+ dorsalis pedis/posterior tibialis pulses. Psychiatric pleasant and cooperative. Notes Right lower extremity: scattered areas of skin breakdown and irritation throughout the leg. A few open wounds with eschar and  nonviable tissue. Left lower extremity: Again scattered areas of skin breakdown along with the wounds with eschar and nonviable tissue. 2+ pitting edema to the knees bilaterally [name] surrounding signs of infection including increased warmth, erythema or purulent drainage to any of the wound beds. Electronic Signature(s) Signed: 12/24/2022 2:02:21 PM By: Geralyn Corwin DO Entered By: Geralyn Corwin on 12/24/2022 11:32:39 -------------------------------------------------------------------------------- Physician Orders Details Patient Name: Date of Service: [name] [name], [name] [name]. 12/24/2022 10:30 A [name] Medical Record Number: [id number] Patient Account Number: [id number] Date of Birth/Sex: Treating RN: September 03, 1955 (67 y.o. [name], [name] Primary Care Provider: Cranford Mon Other Clinician: Referring Provider: Treating Provider/Extender: Shaune Pollack in Treatment: 0 Verbal / Phone Orders: [name] Diagnosis Coding ICD-10 Coding Code Description I87.311 Chronic venous hypertension (idiopathic) with ulcer of right lower extremity L97.812 Non-pressure chronic ulcer of other part of right lower leg with fat layer exposed I89.0 Lymphedema, not elsewhere classified E11.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 other skin ulcer K74.69 Other cirrhosis of liver Follow-up Appointments ppointment in 1 week. - Dr. Mikey Bussing room 7 01/03/2023 1030 Thrusday Return A ppointment in 2 weeks. - Dr. Leanord Hawking covering- 01/08/2023 1015 Tuesday Return A Other: - Steriod cream pick up from pharmacy. Santyl will come from [facility] in [place] pharmacy specialty- they will call you with any copay and will ship to you. [name] [name], [name] [name] ([id number]) [id number] Page 4 of 9 Bathing/ Shower/ Hygiene May shower and wash wound with soap and water. Edema Control - Lymphedema / SCD / Other Elevate legs to the level of the heart or above for 30 minutes daily and/or when sitting for 3-4  times a day throughout the day. Avoid standing for long periods of time. Exercise regularly Other Edema Control Orders/Instructions: - wear tubigrip Size D double layer apply in the morning and remove at night. Wound Treatment Wound #1 - Lower Leg Wound Laterality: Left Cleanser: Soap and Water 1 x Per Day/30 Days Discharge Instructions: May shower and wash wound with dial antibacterial soap and water prior to dressing change. Topical: Triamcinolone 1 x Per Day/30 Days Discharge Instructions: Apply Triamcinolone as directed Prim Dressing: Santyl Ointment 1 x Per Day/30 Days ary Discharge Instructions: Apply nickel thick amount to wound bed. Secondary Dressing: ABD Pad, 8x10 1 x Per Day/30 Days Discharge Instructions: Apply over primary dressing as directed. Secured With: [employer], 4.5x3.1 (in/yd) 1 x Per Day/30 Days Discharge Instructions: Secure with Kerlix as directed. Secured With: 40M Medipore H Soft Cloth Surgical T ape, 4 x 10 (in/yd)  1 x Per Day/30 Days Discharge Instructions: Secure with tape as directed. Wound #2 - Lower Leg Wound Laterality: Right, Lateral Cleanser: Soap and Water 1 x Per Day/30 Days Discharge Instructions: May shower and wash wound with dial antibacterial soap and water prior to dressing change. Topical: Triamcinolone 1 x Per Day/30 Days Discharge Instructions: Apply Triamcinolone as directed Prim Dressing: Santyl Ointment 1 x Per Day/30 Days ary Discharge Instructions: Apply nickel thick amount to wound bed. Secondary Dressing: ABD Pad, 8x10 1 x Per Day/30 Days Discharge Instructions: Apply over primary dressing as directed. Secured With: [employer], 4.5x3.1 (in/yd) 1 x Per Day/30 Days Discharge Instructions: Secure with Kerlix as directed. Secured With: 83M Medipore H Soft Cloth Surgical T ape, 4 x 10 (in/yd) 1 x Per Day/30 Days Discharge Instructions: Secure with tape as directed. Patient Medications llergies: [name] Known Drug  Allergies A Notifications Medication Indication Start End as needed for 12/24/2022 lidocaine debridements. DOSE topical 4 % cream - cream topical once daily 12/24/2022 triamcinolone acetonide DOSE 1 - topical 0.1 % cream - Apply once daily to the legs bilaterally to areas of irritation 12/24/2022 Santyl DOSE 1 - topical 250 unit/gram ointment - Apply once daily to the wounds Electronic Signature(s) Signed: 12/24/2022 2:02:21 PM By: Geralyn Corwin DO Previous Signature: 12/24/2022 11:39:05 AM Version By: Geralyn Corwin DO Previous Signature: 12/24/2022 11:33:46 AM Version By: Geralyn Corwin DO Entered By: Geralyn Corwin on 12/24/2022 11:39:12 [name] [name] ([id number]) [id number] Page 5 of 9 -------------------------------------------------------------------------------- Problem List Details Patient Name: Date of Service: [name] [name]. 12/24/2022 10:30 A [name] Medical Record Number: [id number] Patient Account Number: [id number] Date of Birth/Sex: Treating RN: 1956-02-23 (67 y.o. [name]) Primary Care Provider: Cranford Mon Other Clinician: Referring Provider: Treating Provider/Extender: Shaune Pollack in Treatment: 0 Active Problems ICD-10 Encounter Code Description Active Date MDM Diagnosis I87.313 Chronic venous hypertension (idiopathic) with ulcer of bilateral lower extremity 12/24/2022 [name] Yes L97.818 Non-pressure chronic ulcer of other part of right lower leg with other specified 12/24/2022 [name] Yes severity L97.828 Non-pressure chronic ulcer of other part of left lower leg with other specified 12/24/2022 [name] Yes severity I89.0 Lymphedema, not elsewhere classified 12/24/2022 [name] Yes E11.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 other skin ulcer 12/24/2022 [name] Yes K74.69 Other cirrhosis of liver 12/24/2022 [name] Yes Inactive Problems Resolved Problems Electronic Signature(s) Signed: 12/24/2022 2:02:21 PM By: Geralyn Corwin DO Entered By:  Geralyn Corwin on 12/24/2022 11:29:04 -------------------------------------------------------------------------------- Progress Note Details Patient Name: Date of Service: [name] [name], [name] [name]. 12/24/2022 10:30 A [name] Medical Record Number: [id number] Patient Account Number: [id number] Date of Birth/Sex: Treating RN: September 08, 1955 (67 y.o. [name]) Primary Care Provider: Cranford Mon Other Clinician: Referring Provider: Treating Provider/Extender: Shaune Pollack in Treatment: 0 Subjective Chief Complaint Information obtained from Patient 12/24/2022; bilateral lower extremity wounds History of Present Illness (HPI) 12/24/2022 [name] [name], [name] [name] ([id number]) [id number] Page 6 of 9 Mr. Reshad Perkowski is a 67 year old male with a past medical history of controlled type 2 diabetes on insulin, lymphedema/chronic venous insufficiency and cirrhosis that presents to the clinic for a 1 year history of nonhealing wounds to his lower extremities bilaterally. He reports the starting spontaneously. He states that he has been following with [facility] and they have been using [facility] antibiotic spray to the legs. He is not wearing compression stockings or wraps. He is also tried Medihoney and collagen in the past with little benefit. He currently denies systemic  signs of infection. Patient History Allergies [name] Known Drug Allergies Family History Diabetes - Father, Heart Disease - Father. Social History Current every day smoker, Marital Status - Single, Alcohol Use - Never, Drug Use - [name] History, Caffeine Use - Never. Medical History Hematologic/Lymphatic Patient has history of Anemia, Lymphedema Cardiovascular Patient has history of Hypertension Gastrointestinal Patient has history of Cirrhosis - crpytogenic Endocrine Patient has history of Type II Diabetes - 6.2 HgbA1c Patient is treated with Insulin. Medical A Surgical History  Notes nd Gastrointestinal GERD Genitourinary stage III CKD Objective Constitutional respirations regular, non-labored and within target range for patient.. Vitals Time Taken: 10:44 AM, Height: 72 in, Source: Stated, Weight: 245 lbs, Source: Stated, BMI: 33.2, Temperature: 97.7 F, Pulse: 50 bpm, Respiratory Rate: 20 breaths/min, Blood Pressure: 162/75 mmHg, Capillary Blood Glucose: 125 mg/dl. Cardiovascular 2+ dorsalis pedis/posterior tibialis pulses. Psychiatric pleasant and cooperative. General Notes: Right lower extremity: scattered areas of skin breakdown and irritation throughout the leg. A few open wounds with eschar and nonviable tissue. Left lower extremity: Again scattered areas of skin breakdown along with the wounds with eschar and nonviable tissue. 2+ pitting edema to the knees bilaterally [name] surrounding signs of infection including increased warmth, erythema or purulent drainage to any of the wound beds. Integumentary (Hair, Skin) Wound #1 status is Open. Original cause of wound was Gradually Appeared. The date acquired was: 11/05/2022. The wound is located on the Left Lower Leg. The wound measures 3.5cm length x 2cm width x 0.2cm depth; 5.498cm^2 area and 1.1cm^3 volume. There is [name] tunneling or undermining noted. There is a medium amount of serosanguineous drainage noted. The wound margin is distinct with the outline attached to the wound base. There is [name] granulation within the wound bed. There is a large (67-100%) amount of necrotic tissue within the wound bed including Eschar and Adherent Slough. The periwound skin appearance exhibited: Rash. The periwound skin appearance did not exhibit: Callus, Crepitus, Excoriation, Induration, Scarring, Dry/Scaly, Maceration, Atrophie Blanche, Cyanosis, Ecchymosis, Hemosiderin Staining, Mottled, Pallor, Rubor, Erythema. Wound #2 status is Open. Original cause of wound was Gradually Appeared. The date acquired was: 11/07/2022. The wound is  located on the Right,Lateral Lower Leg. The wound measures 3.5cm length x 5cm width x 0.2cm depth; 13.744cm^2 area and 2.749cm^3 volume. There is [name] tunneling or undermining noted. There is a medium amount of serosanguineous drainage noted. The wound margin is distinct with the outline attached to the wound base. There is [name] granulation within the wound bed. There is a large (67-100%) amount of necrotic tissue within the wound bed including Eschar and Adherent Slough. The periwound skin appearance exhibited: Rash, Dry/Scaly. The periwound skin appearance did not exhibit: Callus, Crepitus, Excoriation, Induration, Scarring, Maceration, Atrophie Blanche, Cyanosis, Ecchymosis, Hemosiderin Staining, Mottled, Pallor, Rubor, Erythema. Assessment Active Problems ICD-10 Chronic venous hypertension (idiopathic) with ulcer of bilateral lower extremity Non-pressure chronic ulcer of other part of right lower leg with other specified severity Non-pressure chronic ulcer of other part of left lower leg with other specified severity [name] [name], [name] [name] ([id number]) [id number] Page 7 of 9 Lymphedema, not elsewhere classified Type 2 diabetes mellitus with other skin ulcer Other cirrhosis of liver Patient presents with a 1 year history of nonhealing ulcers to his legs bilaterally in the setting of diabetes and venous insufficiency. He had ABIs with TBI's on 05/2022 that showed triphasic waveforms at the PTA and DPA bilaterally with normal ABIs. He expressed concern about being debrided today and I will hold off this week. I  recommended Santyl to the wounds that have nonviable tissue. He has a lot of irritation and skin breakdown and I recommended stopping the [facility] antibiotic spray and using triamcinolone cream to these areas. We will give him Tubigrip to help with compression. He will benefit from an office wraps in the near future. Follow-up in 1 week. Procedures Wound  #1 Pre-procedure diagnosis of Wound #1 is a Diabetic Wound/Ulcer of the Lower Extremity located on the Left Lower Leg .Severity of Tissue Pre Debridement is: Fat layer exposed. There was a Chemical/Enzymatic/Mechanical debridement performed by [name], RN.Marland Kitchen Agent used was [employer]. There was [name] bleeding. The procedure was tolerated well. Post Debridement Measurements: 3.5cm length x 2cm width x 0.2cm depth; 1.1cm^3 volume. Character of Wound/Ulcer Post Debridement requires further debridement. Severity of Tissue Post Debridement is: Fat layer exposed. Post procedure Diagnosis Wound #1: Same as Pre-Procedure Wound #2 Pre-procedure diagnosis of Wound #2 is a Diabetic Wound/Ulcer of the Lower Extremity located on the Right,Lateral Lower Leg .Severity of Tissue Pre Debridement is: Fat layer exposed. There was a Chemical/Enzymatic/Mechanical debridement performed by [name], RN.Marland Kitchen Agent used was [employer]. There was [name] bleeding. The procedure was tolerated well. Post Debridement Measurements: 3.5cm length x 5cm width x 0.2cm depth; 2.749cm^3 volume. Character of Wound/Ulcer Post Debridement requires further debridement. Severity of Tissue Post Debridement is: Fat layer exposed. Post procedure Diagnosis Wound #2: Same as Pre-Procedure Plan Follow-up Appointments: Return Appointment in 1 week. - Dr. Mikey Bussing room 7 01/03/2023 1030 Thrusday Return Appointment in 2 weeks. - Dr. Leanord Hawking covering- 01/08/2023 1015 Tuesday Other: - Steriod cream pick up from pharmacy. Santyl will come from [facility] in [place] pharmacy specialty- they will call you with any copay and will ship to you. Bathing/ Shower/ Hygiene: May shower and wash wound with soap and water. Edema Control - Lymphedema / SCD / Other: Elevate legs to the level of the heart or above for 30 minutes daily and/or when sitting for 3-4 times a day throughout the day. Avoid standing for long periods of time. Exercise regularly Other Edema  Control Orders/Instructions: - wear tubigrip Size D double layer apply in the morning and remove at night. The following medication(s) was prescribed: lidocaine topical 4 % cream cream topical once daily for as needed for debridements. was prescribed at facility triamcinolone acetonide topical 0.1 % cream 1 Apply once daily to the legs bilaterally to areas of irritation starting 12/24/2022 Santyl topical 250 unit/gram ointment 1 Apply once daily to the wounds starting 12/24/2022 WOUND #1: - Lower Leg Wound Laterality: Left Cleanser: Soap and Water 1 x Per Day/30 Days Discharge Instructions: May shower and wash wound with dial antibacterial soap and water prior to dressing change. Topical: Triamcinolone 1 x Per Day/30 Days Discharge Instructions: Apply Triamcinolone as directed Prim Dressing: Santyl Ointment 1 x Per Day/30 Days ary Discharge Instructions: Apply nickel thick amount to wound bed. Secondary Dressing: ABD Pad, 8x10 1 x Per Day/30 Days Discharge Instructions: Apply over primary dressing as directed. Secured With: [employer], 4.5x3.1 (in/yd) 1 x Per Day/30 Days Discharge Instructions: Secure with Kerlix as directed. Secured With: 8M Medipore H Soft Cloth Surgical T ape, 4 x 10 (in/yd) 1 x Per Day/30 Days Discharge Instructions: Secure with tape as directed. WOUND #2: - Lower Leg Wound Laterality: Right, Lateral Cleanser: Soap and Water 1 x Per Day/30 Days Discharge Instructions: May shower and wash wound with dial antibacterial soap and water prior to dressing change. Topical: Triamcinolone 1 x Per Day/30 Days  Discharge Instructions: Apply Triamcinolone as directed Prim Dressing: Santyl Ointment 1 x Per Day/30 Days ary Discharge Instructions: Apply nickel thick amount to wound bed. Secondary Dressing: ABD Pad, 8x10 1 x Per Day/30 Days Discharge Instructions: Apply over primary dressing as directed. Secured With: [employer], 4.5x3.1 (in/yd) 1 x Per Day/30  Days Discharge Instructions: Secure with Kerlix as directed. Secured With: 14M Medipore H Soft Cloth Surgical T ape, 4 x 10 (in/yd) 1 x Per Day/30 Days Discharge Instructions: Secure with tape as directed. 1. Santyl 2. Triamcinolone cream 3. Tubigrip 4. Follow-up in 1 week [name] [name], [name] [name] ([id number]) [id number] Page 8 of 9 Electronic Signature(s) Signed: 02/04/2023 2:39:28 PM By: Pearletha Alfred Signed: 02/04/2023 2:57:08 PM By: Geralyn Corwin DO Previous Signature: 12/24/2022 2:02:21 PM Version By: Geralyn Corwin DO Entered By: Pearletha Alfred on 02/04/2023 14:39:28 -------------------------------------------------------------------------------- HxROS Details Patient Name: Date of Service: [name] [name], [name] [name]. 12/24/2022 10:30 A [name] Medical Record Number: [id number] Patient Account Number: [id number] Date of Birth/Sex: Treating RN: 04/08/1956 (67 y.o. [name] [name] Primary Care Provider: Cranford Mon Other Clinician: Referring Provider: Treating Provider/Extender: Shaune Pollack in Treatment: 0 Hematologic/Lymphatic Medical History: Positive for: Anemia; Lymphedema Cardiovascular Medical History: Positive for: Hypertension Gastrointestinal Medical History: Positive for: Cirrhosis - crpytogenic Past Medical History Notes: GERD Endocrine Medical History: Positive for: Type II Diabetes - 6.2 HgbA1c Time with diabetes: 20 yeatrs Treated with: Insulin Genitourinary Medical History: Past Medical History Notes: stage III CKD Immunizations Pneumococcal Vaccine: Received Pneumococcal Vaccination: [name] Implantable Devices [name] devices added Family and Social History Diabetes: Yes - Father; Heart Disease: Yes - Father; Current every day smoker; Marital Status - Single; Alcohol Use: Never; Drug Use: [name] History; Caffeine Use: Never; Financial Concerns: [name]; Food, Clothing or Shelter Needs: [name]; Support System Lacking: [name]; Transportation  Concerns: [name] Electronic Signature(s) Signed: 12/24/2022 2:02:21 PM By: Geralyn Corwin DO Signed: 12/24/2022 6:19:31 PM By: [name] RN, BSN Entered By: [name] on 12/24/2022 10:50:16 [name] [name] ([id number]) [id number] Page 9 of 9 -------------------------------------------------------------------------------- SuperBill Details Patient Name: Date of Service: [name] [name] 12/24/2022 Medical Record Number: [id number] Patient Account Number: [id number] Date of Birth/Sex: Treating RN: August 15, 1955 (67 y.o. [name]) Primary Care Provider: Cranford Mon Other Clinician: Referring Provider: Treating Provider/Extender: Lanney Gins Weeks in Treatment: 0 Diagnosis Coding ICD-10 Codes Code Description 651 [id number] 3859 Chronic venous hypertension (idiopathic) with ulcer of bilateral lower extremity L97.818 Non-pressure chronic ulcer of other part of right lower leg with other specified severity L97.828 Non-pressure chronic ulcer of other part of left lower leg with other specified severity I89.0 Lymphedema, not elsewhere classified E11.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 other skin ulcer K74.69 Other cirrhosis of liver Facility Procedures : CPT4 Code: [id number] Description: 99213 - WOUND CARE VISIT-LEV 3 EST PT Modifier: Quantity: 1 : CPT4 Code: [id number] Description: 44034 - DEBRIDE W/O ANES NON SELECT Modifier: Quantity: 1 Physician Procedures : CPT4 Code Description Modifier [id number] - WC PHYS LEVEL 4 - NEW PT ICD-10 Diagnosis Description 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 

## 2023-02-04 NOTE — Progress Notes (Addendum)
KHAM, MIMMS ([id number]) [id number] Page 1 of 10 Visit Report for 01/31/2023 Chief Complaint Document Details Patient Name: Date of Service: [name] [name]. 01/31/2023 1:30 PM Medical Record Number: [id number] Patient Account Number: [id number] Date of Birth/Sex: Treating RN: 03/06/1956 (67 y.o. M) Primary Care Provider: Cranford Mon Other Clinician: Referring Provider: Treating Provider/Extender: Shaune Pollack in Treatment: 5 Information Obtained from: Patient Chief Complaint 12/24/2022; bilateral lower extremity wounds Electronic Signature(s) Signed: 01/31/2023 4:14:51 PM By: Geralyn Corwin DO Entered By: Geralyn Corwin on 01/31/2023 14:21:01 -------------------------------------------------------------------------------- Cellular or Tissue Based Product Details Patient Name: Date of Service: [name] [name], [name] 01/31/2023 1:30 PM Medical Record Number: [id number] Patient Account Number: [id number] Date of Birth/Sex: Treating RN: 21-May-1956 (67 y.o. [name] [name] Primary Care Provider: Cranford Mon Other Clinician: Referring Provider: Treating Provider/Extender: Shaune Pollack in Treatment: 5 Cellular or Tissue Based Product Type Wound #2 Right,Lateral Lower Leg Applied to: Performed By: Physician Geralyn Corwin, DO Cellular or Tissue Based Product Type: Puraply Level of Consciousness (Pre-procedure): Awake and Alert Pre-procedure Verification/Time Out Yes - 14:15 Taken: Location: trunk / arms / legs Wound Size (sq cm): 0.04 Product Size (sq cm): 4 Waste Size (sq cm): 2.5 Amount of Product Applied (sq cm): 1.5 Instrument Used: Forceps, Scissors Lot #: [id number] Expiration Date: 02/15/2025 Fenestrated: No Secured: Yes Secured With: Steri-Strips Dressing Applied: Yes Primary Dressing: ADAPTIC Procedural Pain: 0 Post Procedural Pain: 0 Response to Treatment: Procedure was tolerated  well Level of Consciousness (Post- Awake and Alert procedure): Post Procedure Diagnosis Same as Pre-procedure LADARRYL, MCCUSKEY ([id number]) [id number] Page 2 of 10 Electronic Signature(s) Signed: 01/31/2023 4:14:51 PM By: Geralyn Corwin DO Signed: 02/01/2023 1:07:25 PM By: Midge Aver MSN RN CNS WTA Entered By: Midge Aver on 01/31/2023 14:18:11 -------------------------------------------------------------------------------- Cellular or Tissue Based Product Details Patient Name: Date of Service: [name] [name], [name] 01/31/2023 1:30 PM Medical Record Number: [id number] Patient Account Number: [id number] Date of Birth/Sex: Treating RN: 1956-04-14 (67 y.o. [name] [name] Primary Care Provider: Cranford Mon Other Clinician: Referring Provider: Treating Provider/Extender: Shaune Pollack in Treatment: 5 Cellular or Tissue Based Product Type Wound #3 Right,Lateral Lower Leg Applied to: Performed By: Physician Geralyn Corwin, DO Cellular or Tissue Based Product Type: Puraply Level of Consciousness (Pre-procedure): Awake and Alert Pre-procedure Verification/Time Out Yes - 14:15 Taken: Location: trunk / arms / legs Wound Size (sq cm): 0.09 Product Size (sq cm): 4 Waste Size (sq cm): 2.5 Amount of Product Applied (sq cm): 1.5 Instrument Used: Forceps, Scissors Lot #: [id number] Expiration Date: 02/15/2025 Fenestrated: No Secured: Yes Secured With: Steri-Strips Dressing Applied: Yes Primary Dressing: ADAPTIC Procedural Pain: 0 Post Procedural Pain: 0 Response to Treatment: Procedure was tolerated well Level of Consciousness (Post- Awake and Alert procedure): Post Procedure Diagnosis Same as Pre-procedure Electronic Signature(s) Signed: 01/31/2023 4:14:51 PM By: Geralyn Corwin DO Signed: 02/01/2023 1:07:25 PM By: Midge Aver MSN RN CNS WTA Entered By: Midge Aver on 01/31/2023  14:37:53 -------------------------------------------------------------------------------- HPI Details Patient Name: Date of Service: [name] [name], [name] 01/31/2023 1:30 PM Medical Record Number: [id number] Patient Account Number: [id number] Date of Birth/Sex: Treating RN: 1955-11-28 (67 y.o. M) Primary Care Provider: Cranford Mon Other Clinician: Referring Provider: Treating Provider/Extender: Shaune Pollack in Treatment: 5 History of Present Illness [name], [name] ([id number]) [id number] Page 3 of 10 HPI Description: 12/24/2022 [name] is a 67 year old male with a  past medical history of controlled type 2 diabetes on insulin, lymphedema/chronic venous insufficiency and cirrhosis that presents to the clinic for a 1 year history of nonhealing wounds to his lower extremities bilaterally. He reports the starting spontaneously. He states that he has been following with [facility] and they have been using [facility] antibiotic spray to the legs. He is not wearing compression stockings or wraps. He is also tried Medihoney and collagen in the past with little benefit. He currently denies systemic signs of infection. 5/30; patient presents for follow-up. He has been using triamcinolone cream to the periwound and Santyl to the wound beds. There has been improvement in wound healing. He denies signs of infection and has no issues or complaints today. 6/4; both legs look considerably better. He has a wound on the right lateral lower leg and the left medial lower leg. Significant hemosiderin in the right leg less so on the left. We have been using Hydrofera Blue under compression 6/13; patient presents for follow-up. We have been using antibiotic ointment with Hydrofera Blue under compression therapy. The wounds are smaller. He has no issues or complaints today. We discussed ordering juxta lite compression garment wraps T use once his  wounds heal as he has hard time putting on o compression stockings. He was agreeable with this. 6/20; patient presents for follow-up. We have been using antibiotic ointment with Hydrofera Blue under compression therapy to the lower extremities bilaterally. Wounds are smaller. He has been approved for PuraPly and was agreeable with placement today. He denies signs of infection. 6/27; patient presents for follow-up. We have been using antibiotic ointment with Hydrofera Blue under compression therapy to the left lower extremity. This wound is healed. He has his juxta lite compression wraps with him today. T the right lateral leg we have been using PuraPly under compression and that wound o is smaller today. Unfortunately he has developed skin breakdown from the Steri-Strip that was used to hold the PuraPly in place and this has created a new wound on the right leg. Electronic Signature(s) Signed: 01/31/2023 4:14:51 PM By: Geralyn Corwin DO Entered By: Geralyn Corwin on 01/31/2023 14:22:56 -------------------------------------------------------------------------------- Physical Exam Details Patient Name: Date of Service: [name] [name], [name] 01/31/2023 1:30 PM Medical Record Number: [id number] Patient Account Number: [id number] Date of Birth/Sex: Treating RN: Nov 18, 1955 (67 y.o. M) Primary Care Provider: Cranford Mon Other Clinician: Referring Provider: Treating Provider/Extender: Lanney Gins Weeks in Treatment: 5 Constitutional respirations regular, non-labored and within target range for patient.. Cardiovascular 2+ dorsalis pedis/posterior tibialis pulses. Psychiatric pleasant and cooperative. Notes On the left lower extremity there is epithelization to the previous wound site. Good edema control. T the right lower extremity there are 2 open wounds the original wound has granulation tissue throughout. Superior to this is a new wound with granulation tissue o and  fibrinous tissue. Electronic Signature(s) Signed: 01/31/2023 4:14:51 PM By: Geralyn Corwin DO Entered By: Geralyn Corwin on 01/31/2023 14:26:57 -------------------------------------------------------------------------------- Physician Orders Details Patient Name: Date of Service: [name] [name], [name] 01/31/2023 1:30 PM Medical Record Number: [id number] Patient Account Number: [id number] Date of Birth/Sex: Treating RN: [birth year] (67 y.o. [name] [name] Primary Care Provider: Cranford Mon Other Clinician: AVYUKT, HIGINBOTHAM ([id number]) [id number] Page 4 of 10 Referring Provider: Treating Provider/Extender: Shaune Pollack in Treatment: 5 Verbal / Phone Orders: No Diagnosis Coding Follow-up Appointments ppointment in 1 week. - 01/31/2023 Dr. Mikey Bussing 130pm Thursday Return A Return appointment  in 3 weeks. - 02/11/2023 0800 Tuesday room 8 Dr. Mikey Bussing Nurse Visit: - Nurse visit Tuesday February 05 799 room 8 Other: - [facility] to send you your juxtalite HDs Anesthetic (In clinic) Topical Lidocaine 4% applied to wound bed Cellular or Tissue Based Products Wound #2 Right,Lateral Lower Leg Cellular or Tissue Based Product Type: - 01/24/2023 #1Puraply AM right leg Cellular or Tissue Based Product applied to wound bed, secured with steri-strips, cover with Adaptic or Mepitel. (DO NOT REMOVE). Bathing/ Shower/ Hygiene May shower with protection but do not get wound dressing(s) wet. Protect dressing(s) with water repellant cover (for example, large plastic bag) or a cast cover and may then take shower. Edema Control - Lymphedema / SCD / Other Elevate legs to the level of the heart or above for 30 minutes daily and/or when sitting for 3-4 times a day throughout the day. Avoid standing for long periods of time. Exercise regularly Compression stocking or Garment 30-40 mm/Hg pressure to: - Juxtalites HD for both legs. Wound Treatment Wound #2 - Lower Leg  Wound Laterality: Right, Lateral Cleanser: Soap and Water 1 x Per Week/30 Days Discharge Instructions: May shower and wash wound with dial antibacterial soap and water prior to dressing change. Peri-Wound Care: Triamcinolone 15 (g) 1 x Per Week/30 Days Discharge Instructions: Use triamcinolone 15 (g) as directed Peri-Wound Care: Sween Lotion (Moisturizing lotion) 1 x Per Week/30 Days Discharge Instructions: Apply moisturizing lotion as directed Prim Dressing: puraply AM 1 x Per Week/30 Days ary Discharge Instructions: applied by provider. Prim Dressing: steristrips and adaptic 1 x Per Week/30 Days ary Discharge Instructions: secure puraply AM in place. Secondary Dressing: Woven Gauze Sponge, Non-Sterile 4x4 in 1 x Per Week/30 Days Discharge Instructions: Apply over primary dressing as directed. Compression Wrap: Urgo K2 Lite, (equivalent to a 3 layer) two layer compression system, regular 1 x Per Week/30 Days Discharge Instructions: Apply Urgo K2 Lite as directed (alternative to 3 layer compression). Wound #3 - Lower Leg Wound Laterality: Right, Lateral Cleanser: Soap and Water 1 x Per Week/30 Days Discharge Instructions: May shower and wash wound with dial antibacterial soap and water prior to dressing change. Peri-Wound Care: Triamcinolone 15 (g) 1 x Per Week/30 Days Discharge Instructions: Use triamcinolone 15 (g) as directed Peri-Wound Care: Sween Lotion (Moisturizing lotion) 1 x Per Week/30 Days Discharge Instructions: Apply moisturizing lotion as directed Prim Dressing: puraply AM 1 x Per Week/30 Days ary Discharge Instructions: applied by provider. Prim Dressing: steristrips and adaptic 1 x Per Week/30 Days ary Discharge Instructions: secure puraply AM in place. Secondary Dressing: Woven Gauze Sponge, Non-Sterile 4x4 in 1 x Per Week/30 Days Discharge Instructions: Apply over primary dressing as directed. Compression Wrap: Urgo K2 Lite, (equivalent to a 3 layer) two layer  compression system, regular 1 x Per Week/30 Days Discharge Instructions: Apply Urgo K2 Lite as directed (alternative to 3 layer compression). HAU, [name] [name] ([id number]) [id number] Page 5 of 10 Electronic Signature(s) Signed: 01/31/2023 4:14:51 PM By: Geralyn Corwin DO Entered By: Geralyn Corwin on 01/31/2023 14:27:08 -------------------------------------------------------------------------------- Problem List Details Patient Name: Date of Service: [name] [name], [name] 01/31/2023 1:30 PM Medical Record Number: [id number] Patient Account Number: [id number] Date of Birth/Sex: Treating RN: 03-06-1956 (67 y.o. M) Primary Care Provider: Cranford Mon Other Clinician: Referring Provider: Treating Provider/Extender: Shaune Pollack in Treatment: 5 Active Problems ICD-10 Encounter Code Description Active Date MDM Diagnosis I87.313 Chronic venous hypertension (idiopathic) with ulcer of bilateral lower extremity 12/24/2022 No Yes L97.818 Non-pressure  chronic ulcer of other part of right lower leg with other specified 12/24/2022 No Yes severity L97.828 Non-pressure chronic ulcer of other part of left lower leg with other specified 12/24/2022 No Yes severity I89.0 Lymphedema, not elsewhere classified 12/24/2022 No Yes E11.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 other skin ulcer 12/24/2022 No Yes K74.69 Other cirrhosis of liver 12/24/2022 No Yes Inactive Problems Resolved Problems Electronic Signature(s) Signed: 01/31/2023 4:14:51 PM By: Geralyn Corwin DO Entered By: Geralyn Corwin on 01/31/2023 14:20:42 -------------------------------------------------------------------------------- Progress Note Details Patient Name: Date of Service: [name] [name], [name] 01/31/2023 1:30 PM Medical Record Number: [id number] Patient Account Number: [id number] Date of Birth/Sex: Treating RN: 05/14/1956 (67 y.o. Gerre Couch ([id number])  [id number] Page 6 of 10 Primary Care Provider: Cranford Mon Other Clinician: Referring Provider: Treating Provider/Extender: Shaune Pollack in Treatment: 5 Subjective Chief Complaint Information obtained from Patient 12/24/2022; bilateral lower extremity wounds History of Present Illness (HPI) 12/24/2022 [name] is a 67 year old male with a past medical history of controlled type 2 diabetes on insulin, lymphedema/chronic venous insufficiency and cirrhosis that presents to the clinic for a 1 year history of nonhealing wounds to his lower extremities bilaterally. He reports the starting spontaneously. He states that he has been following with [facility] and they have been using [facility] antibiotic spray to the legs. He is not wearing compression stockings or wraps. He is also tried Medihoney and collagen in the past with little benefit. He currently denies systemic signs of infection. 5/30; patient presents for follow-up. He has been using triamcinolone cream to the periwound and Santyl to the wound beds. There has been improvement in wound healing. He denies signs of infection and has no issues or complaints today. 6/4; both legs look considerably better. He has a wound on the right lateral lower leg and the left medial lower leg. Significant hemosiderin in the right leg less so on the left. We have been using Hydrofera Blue under compression 6/13; patient presents for follow-up. We have been using antibiotic ointment with Hydrofera Blue under compression therapy. The wounds are smaller. He has no issues or complaints today. We discussed ordering juxta lite compression garment wraps T use once his wounds heal as he has hard time putting on o compression stockings. He was agreeable with this. 6/20; patient presents for follow-up. We have been using antibiotic ointment with Hydrofera Blue under compression therapy to the lower  extremities bilaterally. Wounds are smaller. He has been approved for PuraPly and was agreeable with placement today. He denies signs of infection. 6/27; patient presents for follow-up. We have been using antibiotic ointment with Hydrofera Blue under compression therapy to the left lower extremity. This wound is healed. He has his juxta lite compression wraps with him today. T the right lateral leg we have been using PuraPly under compression and that wound o is smaller today. Unfortunately he has developed skin breakdown from the Steri-Strip that was used to hold the PuraPly in place and this has created a new wound on the right leg. Patient History Family History Diabetes - Father, Heart Disease - Father. Social History Current every day smoker, Marital Status - Single, Alcohol Use - Never, Drug Use - No History, Caffeine Use - Never. Medical History Hematologic/Lymphatic Patient has history of Anemia, Lymphedema Cardiovascular Patient has history of Hypertension Gastrointestinal Patient has history of Cirrhosis - crpytogenic Endocrine Patient has history of Type II Diabetes - 6.2 HgbA1c Medical A Surgical History  Notes nd Gastrointestinal GERD Genitourinary stage III CKD Objective Constitutional respirations regular, non-labored and within target range for patient.. Vitals Time Taken: 1:41 PM, Height: 72 in, Weight: 245 lbs, BMI: 33.2, Temperature: 97.9 F, Pulse: 54 bpm, Respiratory Rate: 18 breaths/min, Blood Pressure: 153/66 mmHg. Cardiovascular 2+ dorsalis pedis/posterior tibialis pulses. Psychiatric pleasant and cooperative. General Notes: On the left lower extremity there is epithelization to the previous wound site. Good edema control. T the right lower extremity there are 2 open o wounds the original wound has granulation tissue throughout. Superior to this is a new wound with granulation tissue and fibrinous tissue. Integumentary (Hair, Skin) [name], [name]  ([id number]) [id number] Page 7 of 10 Wound #1 status is Healed - Epithelialized. Original cause of wound was Gradually Appeared. The date acquired was: 11/05/2022. The wound has been in treatment 5 weeks. The wound is located on the Left Lower Leg. The wound measures 0cm length x 0cm width x 0cm depth; 0cm^2 area and 0cm^3 volume. There is Fat Layer (Subcutaneous Tissue) exposed. There is a small amount of serosanguineous drainage noted. The wound margin is distinct with the outline attached to the wound base. There is large (67-100%) pink granulation within the wound bed. There is no necrotic tissue within the wound bed. The periwound skin appearance exhibited: Rash. The periwound skin appearance did not exhibit: Callus, Crepitus, Excoriation, Induration, Scarring, Dry/Scaly, Maceration, Atrophie Blanche, Cyanosis, Ecchymosis, Hemosiderin Staining, Mottled, Pallor, Rubor, Erythema. Wound #2 status is Open. Original cause of wound was Gradually Appeared. The date acquired was: 11/07/2022. The wound has been in treatment 5 weeks. The wound is located on the Right,Lateral Lower Leg. The wound measures 0.2cm length x 0.2cm width x 0.1cm depth; 0.031cm^2 area and 0.003cm^3 volume. There is Fat Layer (Subcutaneous Tissue) exposed. There is a medium amount of serosanguineous drainage noted. The wound margin is distinct with the outline attached to the wound base. There is large (67-100%) pink, pale granulation within the wound bed. There is a small (1-33%) amount of necrotic tissue within the wound bed including Eschar. The periwound skin appearance exhibited: Rash, Dry/Scaly. The periwound skin appearance did not exhibit: Callus, Crepitus, Excoriation, Induration, Scarring, Maceration, Atrophie Blanche, Cyanosis, Ecchymosis, Hemosiderin Staining, Mottled, Pallor, Rubor, Erythema. Wound #3 status is Open. Original cause of wound was Gradually Appeared. The date acquired was: 01/25/2023.  The wound is located on the Right,Lateral Lower Leg. The wound measures 0.3cm length x 0.3cm width x 0.2cm depth; 0.071cm^2 area and 0.014cm^3 volume. There is Fat Layer (Subcutaneous Tissue) exposed. There is a medium amount of serosanguineous drainage noted. There is small (1-33%) red granulation within the wound bed. There is a small (1-33%) amount of necrotic tissue within the wound bed including Adherent Slough. The periwound skin appearance had no abnormalities noted for texture. The periwound skin appearance had no abnormalities noted for color. The periwound skin appearance did not exhibit: Dry/Scaly, Maceration. 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 The wound is healed. I recommended daily juxta lite compression wrap here. The right lower extremity wound has done well with PuraPly and I will reapply this today. Unfortunately has  developed new wound secondary to likely skin irritation from tape. I also placed PuraPly here and no tape was placed. Adaptic was used and gauze behind it. We continued 3 layer compression on the right lower extremity. Follow-up in 1 week. Procedures Wound #2 Pre-procedure diagnosis of Wound #2 is a Diabetic Wound/Ulcer of the Lower Extremity located on the Right,Lateral Lower Leg. A skin graft procedure using a bioengineered skin substitute/cellular or tissue based product was performed by Geralyn Corwin, DO with the following instrument(s): Forceps and Scissors. Puraply was applied and secured with  Steri-Strips. 1.5 sq cm of product was utilized and 2.5 sq cm was wasted. Post Application, ADAPTIC was applied. A Time Out was conducted at 14:15, prior to the start of the procedure. The procedure was tolerated well with a pain level of 0 throughout and a pain level of 0 following the procedure. Post procedure Diagnosis Wound #2: Same as Pre-Procedure . Pre-procedure diagnosis of Wound #2 is a Diabetic Wound/Ulcer of the Lower Extremity located on the Right,Lateral Lower Leg . There was a Three Layer Compression Therapy Procedure by Midge Aver, RN. Post procedure Diagnosis Wound #2: Same as Pre-Procedure Wound #3 Pre-procedure diagnosis of Wound #3 is a Diabetic Wound/Ulcer of the Lower Extremity located on the Right,Lateral Lower Leg. A skin graft procedure using a bioengineered skin substitute/cellular or tissue based product was performed by Geralyn Corwin, DO with the following instrument(s): Forceps and Scissors. Puraply was applied and secured with Steri-Strips. 1.5 sq cm of product was utilized and 2.5 sq cm was wasted. Post Application, ADAPTIC was applied. A Time Out was conducted at 14:15, prior to the start of the procedure. The procedure was tolerated well with a pain level of 0 throughout and a pain level of 0 following the procedure. Post procedure Diagnosis Wound #3: Same as Pre-Procedure . Pre-procedure diagnosis of Wound #3 is a Diabetic Wound/Ulcer of the Lower Extremity located on the Right,Lateral Lower Leg . There was a Three Layer Compression Therapy Procedure by Midge Aver, RN. Post procedure Diagnosis Wound #3: Same as Pre-Procedure Plan Follow-up Appointments: Return Appointment in 1 week. - 01/31/2023 Dr. Mikey Bussing 130pm Thursday Return appointment in 3 weeks. - 02/11/2023 0800 Tuesday room 8 Dr. Mikey Bussing Nurse Visit: - Nurse visit Tuesday February 05 799 room 8 Other: - [facility] to send you your juxtalite HDs Anesthetic: MILAGRO, TRAYER ([id number])  [id number] Page 8 of 10 (In clinic) Topical Lidocaine 4% applied to wound bed Cellular or Tissue Based Products: Wound #2 Right,Lateral Lower Leg: Cellular or Tissue Based Product Type: - 01/24/2023 #1Puraply AM right leg Cellular or Tissue Based Product applied to wound bed, secured with steri-strips, cover with Adaptic or Mepitel. (DO NOT REMOVE). Bathing/ Shower/ Hygiene: May shower with protection but do not get wound dressing(s) wet. Protect dressing(s) with water repellant cover (for example, large plastic bag) or a cast cover and may then take shower. Edema Control - Lymphedema / SCD / Other: Elevate legs to the level of the heart or above for 30 minutes daily and/or when sitting for 3-4 times a day throughout the day. Avoid standing for long periods of time. Exercise regularly Compression stocking or Garment 30-40 mm/Hg pressure to: - Juxtalites HD for both legs. WOUND #2: - Lower Leg Wound Laterality: Right, Lateral Cleanser: Soap and Water 1 x Per Week/30 Days Discharge Instructions: May shower and wash wound with dial antibacterial soap and water prior to dressing change. Peri-Wound Care: Triamcinolone 15 (g) 1 x Per Week/30 Days Discharge  Instructions: Use triamcinolone 15 (g) as directed Peri-Wound Care: Sween Lotion (Moisturizing lotion) 1 x Per Week/30 Days Discharge Instructions: Apply moisturizing lotion as directed Prim Dressing: puraply AM 1 x Per Week/30 Days ary Discharge Instructions: applied by provider. Prim Dressing: steristrips and adaptic 1 x Per Week/30 Days ary Discharge Instructions: secure puraply AM in place. Secondary Dressing: Woven Gauze Sponge, Non-Sterile 4x4 in 1 x Per Week/30 Days Discharge Instructions: Apply over primary dressing as directed. Com pression Wrap: Urgo K2 Lite, (equivalent to a 3 layer) two layer compression system, regular 1 x Per Week/30 Days Discharge Instructions: Apply Urgo K2 Lite as directed  (alternative to 3 layer compression). WOUND #3: - Lower Leg Wound Laterality: Right, Lateral Cleanser: Soap and Water 1 x Per Week/30 Days Discharge Instructions: May shower and wash wound with dial antibacterial soap and water prior to dressing change. Peri-Wound Care: Triamcinolone 15 (g) 1 x Per Week/30 Days Discharge Instructions: Use triamcinolone 15 (g) as directed Peri-Wound Care: Sween Lotion (Moisturizing lotion) 1 x Per Week/30 Days Discharge Instructions: Apply moisturizing lotion as directed Prim Dressing: puraply AM 1 x Per Week/30 Days ary Discharge Instructions: applied by provider. Prim Dressing: steristrips and adaptic 1 x Per Week/30 Days ary Discharge Instructions: secure puraply AM in place. Secondary Dressing: Woven Gauze Sponge, Non-Sterile 4x4 in 1 x Per Week/30 Days Discharge Instructions: Apply over primary dressing as directed. Com pression Wrap: Urgo K2 Lite, (equivalent to a 3 layer) two layer compression system, regular 1 x Per Week/30 Days Discharge Instructions: Apply Urgo K2 Lite as directed (alternative to 3 layer compression). 1. PuraPly placed in standard fashion to the right lower extremity under 3 layer compression 2. Juxta light compression daily to the left lower extremity 3. Follow-up in 1 week Electronic Signature(s) Signed: 02/04/2023 2:40:12 PM By: Pearletha Alfred Signed: 02/04/2023 2:53:49 PM By: Geralyn Corwin DO Previous Signature: 02/01/2023 2:14:44 PM Version By: [name] RN, BSN Previous Signature: 02/04/2023 10:26:47 AM Version By: Geralyn Corwin DO Previous Signature: 01/31/2023 4:14:51 PM Version By: Geralyn Corwin DO Entered By: Pearletha Alfred on 02/04/2023 14:40:12 -------------------------------------------------------------------------------- HxROS Details Patient Name: Date of Service: [name] [name], [name] 01/31/2023 1:30 PM Medical Record Number: [id number] Patient Account Number: [id number] Date of Birth/Sex: Treating  RN: 07-12-1956 (67 y.o. M) Primary Care Provider: Cranford Mon Other Clinician: Referring Provider: Treating Provider/Extender: Shaune Pollack in Treatment: 5 Hematologic/Lymphatic Medical History: Positive for: Anemia; Lymphedema Cardiovascular Medical History: Positive for: Hypertension AKSHAJ, SPARHAWK ([id number]) [id number] Page 9 of 10 Gastrointestinal Medical History: Positive for: Cirrhosis - crpytogenic Past Medical History Notes: GERD Endocrine Medical History: Positive for: Type II Diabetes - 6.2 HgbA1c Time with diabetes: 20 yeatrs Treated with: Insulin Genitourinary Medical History: Past Medical History Notes: stage III CKD Immunizations Pneumococcal Vaccine: Received Pneumococcal Vaccination: No Implantable Devices No devices added Family and Social History Diabetes: Yes - Father; Heart Disease: Yes - Father; Current every day smoker; Marital Status - Single; Alcohol Use: Never; Drug Use: No History; Caffeine Use: Never; Financial Concerns: No; Food, Clothing or Shelter Needs: No; Support System Lacking: No; Transportation Concerns: No Electronic Signature(s) Signed: 01/31/2023 4:14:51 PM By: Geralyn Corwin DO Entered By: Geralyn Corwin on 01/31/2023 14:23:02 -------------------------------------------------------------------------------- SuperBill Details Patient Name: Date of Service: [name] [name], [name] RRIS M. 01/31/2023 Medical Record Number: [id number] Patient Account Number: [id number] Date of Birth/Sex: Treating RN: 02-Dec-1955 (67 y.o. 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 L97.828 Non-pressure chronic ulcer of other part of left lower leg with other specified severity I89.0 Lymphedema, not elsewhere classified E11.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 other skin ulcer K74.69 Other cirrhosis of liver Facility Procedures : [facility], [facility] Code: [id number] BRAELAN GIESER [phone] Description: 15271 - SKIN SUB GRAFT TRNK/ARM/LEG ICD-10 Diagnosis Description I87.313 Chronic venous hypertension (idiopathic) with ulcer of bilateral lower extremity L97.818 Non-pressure chronic ulcer of other part of right lower leg with other  specified 85) [phone] I89.0 Lymphedema, not elsewhere classified E11.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 other skin ulcer Modifier: severity [id number]_512 Quantity: 1 27.pdf Page 10 of 10 : CPT4 Code: [id number] Q Description: 4196 - PuraPly Product AM 2X2 (4 sq. cm) Modifier: 4 Quantity: Physician Procedures : CPT4 Code Description Modifier [id number] 15271 - WC PHYS SKIN SUB GRAFT TRNK/ARM/LEG ICD-10 Diagnosis Description I87.313 Chronic venous hypertension (idiopathic) with ulcer of bilateral lower extremity L97.818 Non-pressure chronic ulcer of other part of  right lower leg with other specified severity I89.0 Lymphedema, not elsewhere classified E11.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 other skin ulcer Quantity: 1 Electronic Signature(s) Signed: 02/01/2023 11:46:38 AM By: Geralyn Corwin DO Signed: 02/01/2023 2:14:44 PM By: [name] RN, BSN Previous Signature: 01/31/2023 4:14:51 PM Version By: Geralyn Corwin DO Entered By: [name] on 02/01/2023 11:44:44

## 2023-02-05 ENCOUNTER — Encounter (HOSPITAL_BASED_OUTPATIENT_CLINIC_OR_DEPARTMENT_OTHER): Payer: Medicare Other | Attending: General Surgery | Admitting: General Surgery

## 2023-02-05 DIAGNOSIS — L97818 Non-pressure chronic ulcer of other part of right lower leg with other specified severity: Secondary | ICD-10-CM | POA: Insufficient documentation

## 2023-02-05 DIAGNOSIS — I872 Venous insufficiency (chronic) (peripheral): Secondary | ICD-10-CM | POA: Diagnosis not present

## 2023-02-05 DIAGNOSIS — E1151 Type 2 diabetes mellitus with diabetic peripheral angiopathy without gangrene: Secondary | ICD-10-CM | POA: Diagnosis not present

## 2023-02-05 DIAGNOSIS — E1122 Type 2 diabetes mellitus with diabetic chronic kidney disease: Secondary | ICD-10-CM | POA: Insufficient documentation

## 2023-02-05 DIAGNOSIS — F17200 Nicotine dependence, unspecified, uncomplicated: Secondary | ICD-10-CM | POA: Insufficient documentation

## 2023-02-05 DIAGNOSIS — L97828 Non-pressure chronic ulcer of other part of left lower leg with other specified severity: Secondary | ICD-10-CM | POA: Diagnosis not present

## 2023-02-05 DIAGNOSIS — I129 Hypertensive chronic kidney disease with stage 1 through stage 4 chronic kidney disease, or unspecified chronic kidney disease: Secondary | ICD-10-CM | POA: Diagnosis not present

## 2023-02-05 DIAGNOSIS — K7460 Unspecified cirrhosis of liver: Secondary | ICD-10-CM | POA: Diagnosis not present

## 2023-02-05 DIAGNOSIS — E11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 other skin ulcer: Secondary | ICD-10-CM | POA: Diagnosis present

## 2023-02-05 DIAGNOSIS — N183 Chronic kidney disease, stage 3 unspecified: Secondary | ICD-10-CM | POA: Diagnosis not present

## 2023-02-05 DIAGNOSIS — Z794 Long term (current) use of insulin: Secondary | ICD-10-CM | POA: Insufficient documentation

## 2023-02-05 DIAGNOSIS — I890 Lymphedema, not elsewhere classified: Secondary | ICD-10-CM | POA: Diagnosis not present

## 2023-02-05 DIAGNOSIS — D631 Anemia in chronic kidney disease: Secondary | ICD-10-CM | POA: Insufficient documentation

## 2023-02-05 NOTE — Progress Notes (Signed)
BAM, MCCREA ([id number]) [id number] Page 1 of 4 Visit Report for 02/05/2023 Arrival Information Details Patient Name: Date of Service: [name] 02/05/2023 8:00 A M Medical Record Number: [id number] Patient Account Number: [id number] Date of Birth/Sex: Treating RN: [birth year] (67 y.o. [name], [name] Primary Care Dorris Vangorder: Cranford Mon Other Clinician: Referring Eriyana Sweeten: Treating Myldred Raju/Extender: Cassie Freer in Treatment: 6 Visit Information History Since Last Visit Added or deleted any medications: [name] Patient Arrived: Ambulatory Any new allergies or adverse reactions: [name] Arrival Time: 08:17 Had a fall or experienced change in [name] Accompanied By: sister activities of daily living that may affect Transfer Assistance: None risk of falls: Patient Identification Verified: Yes Signs or symptoms of abuse/neglect since last visito [name] Secondary Verification Process Completed: Yes Hospitalized since last visit: [name] Patient Requires Transmission-Based [name] Implantable device outside of the clinic excluding [name] Precautions: cellular tissue based products placed in the center Patient Has Alerts: Yes since last visit: Patient Alerts: 10/23 ABI L0.94 R1 VVS Has Dressing in Place as Prescribed: Yes 10/23 TBI L1.2 R0.88 Has Compression in Place as Prescribed: Yes VVS Pain Present Now: [name] Electronic Signature(s) Signed: 02/05/2023 5:15:32 PM By: [name] RN, BSN Entered By: [name] on 02/05/2023 08:17:40 -------------------------------------------------------------------------------- Compression Therapy Details Patient Name: Date of Service: [name], [name] RRIS M. 02/05/2023 8:00 A M Medical Record Number: [id number] Patient Account Number: [id number] Date of Birth/Sex: Treating RN: Sep 02, 1955 (67 y.o. [name] [name] Primary Care Gabryella Murfin: Cranford Mon Other Clinician: Referring Noela Brothers: Treating Gearldine Looney/Extender:  Cassie Freer in Treatment: 6 Compression Therapy Performed for Wound Assessment: Wound #2 Right,Lateral Lower Leg Performed By: Clinician [name], RN Compression Type: Double Layer Electronic Signature(s) Signed: 02/05/2023 5:15:32 PM By: [name] RN, BSN Entered By: [name] on 02/05/2023 08:18:22 -------------------------------------------------------------------------------- Compression Therapy Details Patient Name: Date of Service: [name], [name] RRIS M. 02/05/2023 8:00 A M Medical Record Number: [id number] Patient Account Number: [id number] SAJE, KANEMOTO ([id number]) [id number] Page 2 of 4 Date of Birth/Sex: Treating RN: 1956/01/15 (67 y.o. [name] [name] Primary Care Lelaina Oatis: Other Clinician: Cranford Mon Referring Enyah Moman: Treating Belal Scallon/Extender: Cassie Freer in Treatment: 6 Compression Therapy Performed for Wound Assessment: Wound #3 Right,Lateral Lower Leg Performed By: Clinician [name], RN Compression Type: Double Layer Electronic Signature(s) Signed: 02/05/2023 5:15:32 PM By: [name] RN, BSN Entered By: [name] on 02/05/2023 08:18:22 -------------------------------------------------------------------------------- Encounter Discharge Information Details Patient Name: Date of Service: [name], [name] RRIS M. 02/05/2023 8:00 A M Medical Record Number: [id number] Patient Account Number: [id number] Date of Birth/Sex: Treating RN: Apr 05, 1956 (67 y.o. [name] [name] Primary Care Quana Chamberlain: Cranford Mon Other Clinician: Referring Letica Giaimo: Treating Raziah Funnell/Extender: Cassie Freer in Treatment: 6 Encounter Discharge Information Items Discharge Condition: Stable Ambulatory Status: Ambulatory Discharge Destination: Home Transportation: Private Auto Accompanied By: sister Schedule Follow-up Appointment: Yes Clinical Summary of Care: Electronic  Signature(s) Signed: 02/05/2023 5:15:32 PM By: [name] RN, BSN Entered By: [name] on 02/05/2023 08:18:45 -------------------------------------------------------------------------------- Wound Assessment Details Patient Name: Date of Service: CA 10, [name] RRIS M. 02/05/2023 8:00 A M Medical Record Number: [id number] Patient Account Number: [id number] Date of Birth/Sex: Treating RN: 15-Feb-1956 (67 y.o. [name] [name] Primary Care Delpha Perko: Cranford Mon Other Clinician: Referring Alynn Ellithorpe: Treating Benetta Maclaren/Extender: Cassie Freer in Treatment: 6 Wound Status Wound Number: 2 Primary Etiology: Diabetic Wound/Ulcer of the Lower Extremity Wound Location:  Right, Lateral Lower Leg Secondary Etiology: Lymphedema Wounding Event: Gradually Appeared Wound Status: Open Date Acquired: 11/07/2022 Weeks Of Treatment: 6 Clustered Wound: Yes Wound Measurements Length: (cm) 0.2 Width: (cm) 0.2 Depth: (cm) 0.1 Area: (cm) 0.031 Volume: (cm) 0.003 [name], [name] M ([id number]) % Reduction in Area: 99.8% % Reduction in Volume: 99.9% [id number] Page 3 of 4 Wound Description Classification: Grade 2 Exudate Amount: Medium Exudate Type: Serosanguineous Exudate Color: red, brown Periwound Skin Texture Texture Color [name] Abnormalities Noted: [name] [name] Abnormalities Noted: [name] Moisture [name] Abnormalities Noted: [name] Treatment Notes Wound #2 (Lower Leg) Wound Laterality: Right, Lateral Cleanser Soap and Water Discharge Instruction: May shower and wash wound with dial antibacterial soap and water prior to dressing change. Peri-Wound Care Triamcinolone 15 (g) Discharge Instruction: Use triamcinolone 15 (g) as directed Sween Lotion (Moisturizing lotion) Discharge Instruction: Apply moisturizing lotion as directed Topical Primary Dressing puraply AM Discharge Instruction: applied by Telly Jawad. steristrips and adaptic Discharge Instruction: secure  puraply AM in place. Secondary Dressing Woven Gauze Sponge, Non-Sterile 4x4 in Discharge Instruction: Apply over primary dressing as directed. Secured With Compression Wrap Urgo K2 Lite, (equivalent to a 3 layer) two layer compression system, regular Discharge Instruction: Apply Urgo K2 Lite as directed (alternative to 3 layer compression). Compression Stockings Add-Ons Electronic Signature(s) Signed: 02/05/2023 5:15:32 PM By: [name] RN, BSN Entered By: [name] on 02/05/2023 08:17:52 -------------------------------------------------------------------------------- Wound Assessment Details Patient Name: Date of Service: [name], [name] RRIS M. 02/05/2023 8:00 A M Medical Record Number: [id number] Patient Account Number: [id number] Date of Birth/Sex: Treating RN: [birth year] (67 y.o. [name] [name] Primary Care Shana Zavaleta: Cranford Mon Other Clinician: Referring Ranya Fiddler: Treating Hulon Ferron/Extender: Eloy End Weeks in Treatment: 6 Wound Status Wound Number: 3 Primary Etiology: Diabetic Wound/Ulcer of the Lower Extremity Wound Location: Right, Lateral Lower Leg Wound Status: Open Wounding Event: Gradually [name], [name] ([id number]) [phone].pdf Page 4 of 4 Date Acquired: 01/25/2023 Weeks Of Treatment: 0 Clustered Wound: [name] Wound Measurements Length: (cm) 0.3 Width: (cm) 0.3 Depth: (cm) 0.2 Area: (cm) 0.071 Volume: (cm) 0.014 % Reduction in Area: 0% % Reduction in Volume: 0% Wound Description Classification: Grade 1 Exudate Amount: Medium Exudate Type: Serosanguineous Exudate Color: red, brown Periwound Skin Texture Texture Color [name] Abnormalities Noted: [name] [name] Abnormalities Noted: [name] Moisture [name] Abnormalities Noted: [name] Treatment Notes Wound #3 (Lower Leg) Wound Laterality: Right, Lateral Cleanser Soap and Water Discharge Instruction: May shower and wash wound with dial antibacterial soap and water prior to  dressing change. Peri-Wound Care Triamcinolone 15 (g) Discharge Instruction: Use triamcinolone 15 (g) as directed Sween Lotion (Moisturizing lotion) Discharge Instruction: Apply moisturizing lotion as directed Topical Primary Dressing puraply AM Discharge Instruction: applied by Albino Bufford. steristrips and adaptic Discharge Instruction: secure puraply AM in place. Secondary Dressing Woven Gauze Sponge, Non-Sterile 4x4 in Discharge Instruction: Apply over primary dressing as directed. Secured With Compression Wrap Urgo K2 Lite, (equivalent to a 3 layer) two layer compression system, regular Discharge Instruction: Apply Urgo K2 Lite as directed (alternative to 3 layer compression). Compression Stockings Add-Ons Electronic Signature(s) Signed: 02/05/2023 5:15:32 PM By: [name] RN, BSN Entered By: [name] on 02/05/2023 08:17:53

## 2023-02-05 NOTE — Progress Notes (Signed)
EL, PENSINGER ([id number]) [id number] Page 1 of 1 Visit Report for 02/05/2023 SuperBill Details Patient Name: Date of Service: [name] 02/05/2023 Medical Record Number: [id number] Patient Account Number: [id number] Date of Birth/Sex: Treating RN: 01-16-1956 (67 y.o. 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

## 2023-02-11 ENCOUNTER — Encounter (HOSPITAL_BASED_OUTPATIENT_CLINIC_OR_DEPARTMENT_OTHER): Payer: Medicare Other | Admitting: Internal Medicine

## 2023-02-11 DIAGNOSIS — I87313 Chronic venous hypertension (idiopathic) with ulcer of bilateral lower extremity: Secondary | ICD-10-CM | POA: Diagnosis not present

## 2023-02-11 DIAGNOSIS — L97828 Non-pressure chronic ulcer of other part of left lower leg with other specified severity: Secondary | ICD-10-CM

## 2023-02-11 DIAGNOSIS — L97818 Non-pressure chronic ulcer of other part of right lower leg with other specified severity: Secondary | ICD-10-CM

## 2023-02-11 DIAGNOSIS — E11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 other skin ulcer: Secondary | ICD-10-CM | POA: Diagnosis not present

## 2023-02-11 NOTE — Progress Notes (Signed)
[name], [name] ([id number]) [id number] Page 1 of 9 Visit Report for 02/11/2023 Chief Complaint Document Details Patient Name: Date of Service: [name] [name] [name] RRIS M. 02/11/2023 8:00 A M Medical Record Number: [id number] Patient Account Number: [id number] Date of Birth/Sex: Treating RN: 1956/03/12 (67 y.o. M) Primary Care Provider: Cranford [name] Other Clinician: Referring Provider: Treating Provider/Extender: [name] [name] [name] in Treatment: 7 Information Obtained from: Patient Chief Complaint 12/24/2022; bilateral lower extremity wounds Electronic Signature(s) Signed: 02/11/2023 12:25:26 PM By: [name] [name] Corwin DO Entered By: [name] [name] [name] on 02/11/2023 09:40:40 -------------------------------------------------------------------------------- HPI Details Patient Name: Date of Service: [name] [name] [name], [name] [name] RRIS M. 02/11/2023 8:00 A M Medical Record Number: [id number] Patient Account Number: [id number] Date of Birth/Sex: Treating RN: 1956-06-18 (67 y.o. M) Primary Care Provider: Cranford [name] Other Clinician: Referring Provider: Treating Provider/Extender: [name] [name] [name] in Treatment: 7 History of Present Illness HPI Description: 12/24/2022 [name] [name] [name] is a 67 year old male with a past medical history of controlled type 2 diabetes on insulin, lymphedema/chronic venous insufficiency and cirrhosis that presents to the clinic for a 1 year history of nonhealing wounds to his lower extremities bilaterally. He reports the starting spontaneously. He states that he has been following with [facility] and they have been using [facility] antibiotic spray to the legs. He is not wearing compression stockings or wraps. He is also tried Medihoney and collagen in the past with little benefit. He currently denies systemic signs of infection. 5/30; patient presents for follow-up. He has been using triamcinolone cream to the periwound and  Santyl to the wound beds. There has been improvement in wound healing. He denies signs of infection and has [name] [name] issues or complaints today. 6/4; both legs look considerably better. He has a wound on the right lateral lower leg and the left medial lower leg. Significant hemosiderin in the right leg less so on the left. We have been using Hydrofera Blue under compression 6/13; patient presents for follow-up. We have been using antibiotic ointment with Hydrofera Blue under compression therapy. The wounds are smaller. He has [name] [name] issues or complaints today. We discussed ordering juxta lite compression garment wraps T use once his wounds heal as he has hard time putting on o compression stockings. He was agreeable with this. 6/20; patient presents for follow-up. We have been using antibiotic ointment with Hydrofera Blue under compression therapy to the lower extremities bilaterally. Wounds are smaller. He has been approved for PuraPly and was agreeable with placement today. He denies signs of infection. 6/27; patient presents for follow-up. We have been using antibiotic ointment with Hydrofera Blue under compression therapy to the left lower extremity. This wound is healed. He has his juxta lite compression wraps with him today. T the right lateral leg we have been using PuraPly under compression and that wound o is smaller today. Unfortunately he has developed skin breakdown from the Steri-Strip that was used to hold the PuraPly in place and this has created a new wound on the right leg. 7/8; patient presents for follow-up. We have been using PuraPly to the right lateral leg wound Under compression wrap. Wound is slightly smaller however original wound used for PuraPly is healed. He unfortunately developed a new wound to the left lower extremity but it looks like this was from potential scratching. He has been using his juxta lite compression here. Electronic Signature(s) [name] [name] [name] ([id number])  [id number] Page 2 of 9 Signed: 02/11/2023 12:25:26 PM By: [name] [name] Corwin  DO Entered By: [name] [name] [name] on 02/11/2023 09:43:44 -------------------------------------------------------------------------------- Physical Exam Details Patient Name: Date of Service: [name] [name] Newton RRIS M. 02/11/2023 8:00 A M Medical Record Number: [id number] Patient Account Number: [id number] Date of Birth/Sex: Treating RN: [birth year] (67 y.o. M) Primary Care Provider: Cranford [name] Other Clinician: Referring Provider: Treating Provider/Extender: [name] [name] [name] in Treatment: 7 Constitutional respirations regular, non-labored and within target range for patient.. Cardiovascular 2+ dorsalis pedis/posterior tibialis pulses. Psychiatric pleasant and cooperative. Notes T the left lower extremity distal medial aspect there is are 2 small open wound with dried nonviable tissue. Venous stasis dermatitis. Decent edema control. o T the right lower extremity there is 1 open wound with nonviable tissue throughout. [name] [name] signs of surrounding infection. Venous stasis dermatitis. Decent edema o control. Electronic Signature(s) Signed: 02/11/2023 12:25:26 PM By: [name] [name] Corwin DO Entered By: [name] [name] [name] on 02/11/2023 09:44:50 -------------------------------------------------------------------------------- Physician Orders Details Patient Name: Date of Service: [name] [name] [name], [name] [name] RRIS M. 02/11/2023 8:00 A M Medical Record Number: [id number] Patient Account Number: [id number] Date of Birth/Sex: Treating RN: 08-Aug-1955 (67 y.o. [name] [name] [name], [name] [name] [name].Loa Primary Care Provider: Cranford [name] Other Clinician: Referring Provider: Treating Provider/Extender: [name] [name] [name] in Treatment: 7 Verbal / Phone Orders: [name] [name] Diagnosis Coding ICD-10 Coding Code Description I87.313 Chronic venous hypertension (idiopathic) with ulcer of bilateral lower extremity L97.818 Non-pressure  chronic ulcer of other part of right lower leg with other specified severity L97.828 Non-pressure chronic ulcer of other part of left lower leg with other specified severity I89.0 Lymphedema, not elsewhere classified E11.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 other skin ulcer K74.69 Other cirrhosis of liver Follow-up Appointments ppointment in 1 week. - ***cancel****02/18/2023 Return A 02/19/2023 Dr. Mikey Bussing 0800 Tuesday ppointment in 2 weeks. - 02/25/2023 845 Dr. Mikey Bussing 0845 Monday Return A Other: - [facility] to send you your juxtalite HDs [name] [name] [name], [name] [name] [name] [place] ([id number]) [id number] Page 3 of 9 Anesthetic (In clinic) Topical Lidocaine 4% applied to wound bed Bathing/ Shower/ Hygiene May shower with protection but do not get wound dressing(s) wet. Protect dressing(s) with water repellant cover (for example, large plastic bag) or a cast cover and may then take shower. Edema Control - Lymphedema / SCD / Other Elevate legs to the level of the heart or above for 30 minutes daily and/or when sitting for 3-4 times a day throughout the day. Avoid standing for long periods of time. Exercise regularly Compression stocking or Garment 30-40 mm/Hg pressure to: - Juxtalites HD for both legs. Wound Treatment Wound #3 - Lower Leg Wound Laterality: Right, Lateral, Proximal Cleanser: Soap and Water 1 x Per Week/30 Days Discharge Instructions: May shower and wash wound with dial antibacterial soap and water prior to dressing change. Peri-Wound Care: Triamcinolone 15 (g) 1 x Per Week/30 Days Discharge Instructions: Use triamcinolone 15 (g) as directed Peri-Wound Care: Sween Lotion (Moisturizing lotion) 1 x Per Week/30 Days Discharge Instructions: Apply moisturizing lotion as directed Topical: Gentamicin 1 x Per Week/30 Days Discharge Instructions: As directed by physician Topical: Mupirocin Ointment 1 x Per Week/30 Days Discharge Instructions: Apply Mupirocin (Bactroban) as  instructed Prim Dressing: Hydrofera Blue Ready Transfer Foam, 2.5x2.5 (in/in) 1 x Per Week/30 Days ary Discharge Instructions: Apply directly to wound bed as directed Secondary Dressing: Woven Gauze Sponge, Non-Sterile 4x4 in 1 x Per Week/30 Days Discharge Instructions: Apply over primary dressing as directed. Compression Wrap: Urgo K2 Lite, (equivalent to a 3 layer) two layer compression system, regular 1 x Per Week/30 Days Discharge Instructions:  Apply Urgo K2 Lite as directed (alternative to 3 layer compression). Wound #4 - Lower Leg Wound Laterality: Left, Medial, Distal Cleanser: Soap and Water 1 x Per Week/30 Days Discharge Instructions: May shower and wash wound with dial antibacterial soap and water prior to dressing change. Peri-Wound Care: Triamcinolone 15 (g) 1 x Per Week/30 Days Discharge Instructions: Use triamcinolone 15 (g) as directed Peri-Wound Care: Sween Lotion (Moisturizing lotion) 1 x Per Week/30 Days Discharge Instructions: Apply moisturizing lotion as directed Topical: Gentamicin 1 x Per Week/30 Days Discharge Instructions: As directed by physician Topical: Mupirocin Ointment 1 x Per Week/30 Days Discharge Instructions: Apply Mupirocin (Bactroban) as instructed Prim Dressing: Hydrofera Blue Ready Transfer Foam, 2.5x2.5 (in/in) 1 x Per Week/30 Days ary Discharge Instructions: Apply directly to wound bed as directed Secondary Dressing: Woven Gauze Sponge, Non-Sterile 4x4 in 1 x Per Week/30 Days Discharge Instructions: Apply over primary dressing as directed. Compression Wrap: Urgo K2 Lite, (equivalent to a 3 layer) two layer compression system, regular 1 x Per Week/30 Days Discharge Instructions: Apply Urgo K2 Lite as directed (alternative to 3 layer compression). Electronic Signature(s) Signed: 02/11/2023 12:25:26 PM By: [name] [name] Corwin DO Entered By: [name] [name] [name] on 02/11/2023 09:44:59 [name] [name] [name] ([id number]) [id number] Page 4  of 9 -------------------------------------------------------------------------------- Problem List Details Patient Name: Date of Service: [name] [name] [name]. 02/11/2023 8:00 A M Medical Record Number: [id number] Patient Account Number: [id number] Date of Birth/Sex: Treating RN: 10-May-1956 (67 y.o. [name] [name] [name], [name] [name] [name].Loa Primary Care Provider: Cranford [name] Other Clinician: Referring Provider: Treating Provider/Extender: [name] [name] [name] in Treatment: 7 Active Problems ICD-10 Encounter Code Description Active Date MDM Diagnosis I87.313 Chronic venous hypertension (idiopathic) with ulcer of bilateral lower extremity 12/24/2022 [name] [name] Yes L97.818 Non-pressure chronic ulcer of other part of right lower leg with other specified 12/24/2022 [name] [name] Yes severity L97.828 Non-pressure chronic ulcer of other part of left lower leg with other specified 12/24/2022 [name] [name] Yes severity I89.0 Lymphedema, not elsewhere classified 12/24/2022 [name] [name] Yes E11.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 other skin ulcer 12/24/2022 [name] [name] Yes K74.69 Other cirrhosis of liver 12/24/2022 [name] [name] Yes Inactive Problems Resolved Problems Electronic Signature(s) Signed: 02/11/2023 12:25:26 PM By: [name] [name] Corwin DO Entered By: [name] [name] [name] on 02/11/2023 09:40:28 -------------------------------------------------------------------------------- Progress Note Details Patient Name: Date of Service: [name] [name] [name] [name] [name], [name] [name] RRIS M. 02/11/2023 8:00 A M Medical Record Number: [id number] Patient Account Number: [id number] Date of Birth/Sex: Treating RN: 30-Aug-1955 (67 y.o. M) Primary Care Provider: Cranford [name] Other Clinician: Referring Provider: Treating Provider/Extender: [name] [name] [name] in Treatment: [address] [place] Judie Petit ([id number]) [id number] Page 5 of 9 Chief Complaint Information obtained from Patient 12/24/2022; bilateral lower extremity wounds History of Present Illness  (HPI) 12/24/2022 Mr. Barkon Clink is a 67 year old male with a past medical history of controlled type 2 diabetes on insulin, lymphedema/chronic venous insufficiency and cirrhosis that presents to the clinic for a 1 year history of nonhealing wounds to his lower extremities bilaterally. He reports the starting spontaneously. He states that he has been following with [facility] and they have been using [facility] antibiotic spray to the legs. He is not wearing compression stockings or wraps. He is also tried Medihoney and collagen in the past with little benefit. He currently denies systemic signs of infection. 5/30; patient presents for follow-up. He has been using triamcinolone cream to the periwound and Santyl to the wound beds. There has been improvement in wound healing. He denies signs of infection and has [name] [name]  issues or complaints today. 6/4; both legs look considerably better. He has a wound on the right lateral lower leg and the left medial lower leg. Significant hemosiderin in the right leg less so on the left. We have been using Hydrofera Blue under compression 6/13; patient presents for follow-up. We have been using antibiotic ointment with Hydrofera Blue under compression therapy. The wounds are smaller. He has [name] [name] issues or complaints today. We discussed ordering juxta lite compression garment wraps T use once his wounds heal as he has hard time putting on o compression stockings. He was agreeable with this. 6/20; patient presents for follow-up. We have been using antibiotic ointment with Hydrofera Blue under compression therapy to the lower extremities bilaterally. Wounds are smaller. He has been approved for PuraPly and was agreeable with placement today. He denies signs of infection. 6/27; patient presents for follow-up. We have been using antibiotic ointment with Hydrofera Blue under compression therapy to the left lower extremity. This wound is healed. He has his juxta lite  compression wraps with him today. T the right lateral leg we have been using PuraPly under compression and that wound o is smaller today. Unfortunately he has developed skin breakdown from the Steri-Strip that was used to hold the PuraPly in place and this has created a new wound on the right leg. 7/8; patient presents for follow-up. We have been using PuraPly to the right lateral leg wound Under compression wrap. Wound is slightly smaller however original wound used for PuraPly is healed. He unfortunately developed a new wound to the left lower extremity but it looks like this was from potential scratching. He has been using his juxta lite compression here. Patient History Family History Diabetes - Father, Heart Disease - Father. Social History Current every day smoker, Marital Status - Single, Alcohol Use - Never, Drug Use - [name] [name] History, Caffeine Use - Never. Medical History Hematologic/Lymphatic Patient has history of Anemia, Lymphedema Cardiovascular Patient has history of Hypertension Gastrointestinal Patient has history of Cirrhosis - crpytogenic Endocrine Patient has history of Type II Diabetes - 6.2 HgbA1c Medical A Surgical History Notes nd Gastrointestinal GERD Genitourinary stage III CKD Objective Constitutional respirations regular, non-labored and within target range for patient.. Vitals Time Taken: 8:00 AM, Height: 72 in, Weight: 245 lbs, BMI: 33.2, Temperature: 97.8 F, Pulse: 51 bpm, Respiratory Rate: 18 breaths/min, Blood Pressure: 136/70 mmHg. Cardiovascular 2+ dorsalis pedis/posterior tibialis pulses. Psychiatric pleasant and cooperative. General Notes: T the left lower extremity distal medial aspect there is are 2 small open wound with dried nonviable tissue. Venous stasis dermatitis. Decent o edema control. T the right lower extremity there is 1 open wound with nonviable tissue throughout. [name] [name] signs of surrounding infection. Venous stasis dermatitis. o Decent  edema control. Integumentary (Hair, Skin) Wound #1R status is Open. Original cause of wound was Gradually Appeared. The date acquired was: 11/05/2022. The wound has been in treatment 7 weeks. The wound is located on the Left Lower Leg. The wound measures 0.6cm length x 0.5cm width x 0.1cm depth; 0.236cm^2 area and 0.024cm^3 volume. There is Fat [name] [name] [name], [name] [name] [name] ([id number]) [id number] Page 6 of 9 Layer (Subcutaneous Tissue) exposed. There is [name] [name] tunneling or undermining noted. There is a small amount of serosanguineous drainage noted. The wound margin is distinct with the outline attached to the wound base. There is [name] [name] granulation within the wound bed. There is a large (67-100%) amount of necrotic tissue within the wound bed including Eschar and Adherent Slough. The periwound skin appearance  exhibited: Rash. The periwound skin appearance did not exhibit: Callus, Crepitus, Excoriation, Induration, Scarring, Dry/Scaly, Maceration, Atrophie Blanche, Cyanosis, Ecchymosis, Hemosiderin Staining, Mottled, Pallor, Rubor, Erythema. Wound #2 status is Healed - Epithelialized. Original cause of wound was Gradually Appeared. The date acquired was: 11/07/2022. The wound has been in treatment 7 weeks. The wound is located on the Right,Lateral Lower Leg. The wound measures 0cm length x 0cm width x 0cm depth; 0cm^2 area and 0cm^3 volume. There is Fat Layer (Subcutaneous Tissue) exposed. There is [name] [name] tunneling or undermining noted. There is a medium amount of serosanguineous drainage noted. There is [name] [name] granulation within the wound bed. There is a large (67-100%) amount of necrotic tissue within the wound bed. Wound #3 status is Open. Original cause of wound was Gradually Appeared. The date acquired was: 01/25/2023. The wound has been in treatment 1 weeks. The wound is located on the Right,Proximal,Lateral Lower Leg. The wound measures 0.7cm length x 0.4cm width x 0.1cm depth; 0.22cm^2 area and  0.022cm^3 volume. There is Fat Layer (Subcutaneous Tissue) exposed. There is [name] [name] tunneling or undermining noted. There is a medium amount of serosanguineous drainage noted. The wound margin is distinct with the outline attached to the wound base. There is [name] [name] granulation within the wound bed. There is a large (67- 100%) amount of necrotic tissue within the wound bed including Adherent Slough. The periwound skin appearance did not exhibit: Callus, Crepitus, Excoriation, Induration, Rash, Scarring, Dry/Scaly, Maceration, Atrophie Blanche, Cyanosis, Ecchymosis, Hemosiderin Staining, Mottled, Pallor, Rubor, Erythema. Wound #4 status is Open. Original cause of wound was Gradually Appeared. The date acquired was: 02/11/2023. The wound is located on the Left,Distal,Medial Lower Leg. The wound measures 0.7cm length x 0.7cm width x 0.1cm depth; 0.385cm^2 area and 0.038cm^3 volume. There is [name] [name] tunneling or undermining noted. There is a medium amount of serosanguineous drainage noted. The wound margin is distinct with the outline attached to the wound base. There is small (1-33%) pink, pale granulation within the wound bed. There is a large (67-100%) amount of necrotic tissue within the wound bed including Eschar and Adherent Slough. The periwound skin appearance exhibited: Scarring, Hemosiderin Staining. The periwound skin appearance did not exhibit: Callus, Crepitus, Excoriation, Induration, Rash, Dry/Scaly, Maceration, Atrophie Blanche, Cyanosis, Ecchymosis, Mottled, Pallor, Rubor, Erythema. Assessment Active Problems ICD-10 Chronic venous hypertension (idiopathic) with ulcer of bilateral lower extremity Non-pressure chronic ulcer of other part of right lower leg with other specified severity Non-pressure chronic ulcer of other part of left lower leg with other specified severity Lymphedema, not elsewhere classified Type 2 diabetes mellitus with other skin ulcer Other cirrhosis of liver Patient's right lower  extremity wound is stable. I unfortunately he has developed a new wound to the left lower extremity but this appears to be from a scratch. I Recommended going back to [facility] and antibiotic ointment to the wound beds under 3 layer compression to the lower extremities bilaterally. Follow-up in 1 week. Procedures Wound #3 Pre-procedure diagnosis of Wound #3 is a Diabetic Wound/Ulcer of the Lower Extremity located on the Right,Proximal,Lateral Lower Leg . There was a Double Layer Compression Therapy Procedure by [name], RN. Post procedure Diagnosis Wound #3: Same as Pre-Procedure Wound #4 Pre-procedure diagnosis of Wound #4 is a Diabetic Wound/Ulcer of the Lower Extremity located on the Left,Distal,Medial Lower Leg . There was a Double Layer Compression Therapy Procedure by [name], RN. Post procedure Diagnosis Wound #4: Same as Pre-Procedure Plan Follow-up Appointments: Return Appointment in 1 week. - ***cancel****02/18/2023 02/19/2023 Dr.  Raman Featherston 0800 Tuesday Return Appointment in 2 weeks. - 02/25/2023 845 Dr. Mikey Bussing 0845 Monday Other: - [facility] to send you your juxtalite HDs Anesthetic: (In clinic) Topical Lidocaine 4% applied to wound bed Bathing/ Shower/ Hygiene: May shower with protection but do not get wound dressing(s) wet. Protect dressing(s) with water repellant cover (for example, large plastic bag) or a cast cover and may then take shower. Edema Control - Lymphedema / SCD / Other: Elevate legs to the level of the heart or above for 30 minutes daily and/or when sitting for 3-4 times a day throughout the day. Avoid standing for long periods of time. Exercise regularly Compression stocking or Garment 30-40 mm/Hg pressure to: - Juxtalites HD for both legs. WOUND #3: - Lower Leg Wound Laterality: Right, Lateral, Proximal Cleanser: Soap and Water 1 x Per Week/30 Days Discharge Instructions: May shower and wash wound with dial antibacterial soap and water prior to  dressing change. Peri-Wound Care: Triamcinolone 15 (g) 1 x Per Week/30 Days [name] [name] [name], [name] [name] [name] ([id number]) [phone].pdf Page 7 of 9 Discharge Instructions: Use triamcinolone 15 (g) as directed Peri-Wound Care: Sween Lotion (Moisturizing lotion) 1 x Per Week/30 Days Discharge Instructions: Apply moisturizing lotion as directed Topical: Gentamicin 1 x Per Week/30 Days Discharge Instructions: As directed by physician Topical: Mupirocin Ointment 1 x Per Week/30 Days Discharge Instructions: Apply Mupirocin (Bactroban) as instructed Prim Dressing: Hydrofera Blue Ready Transfer Foam, 2.5x2.5 (in/in) 1 x Per Week/30 Days ary Discharge Instructions: Apply directly to wound bed as directed Secondary Dressing: Woven Gauze Sponge, Non-Sterile 4x4 in 1 x Per Week/30 Days Discharge Instructions: Apply over primary dressing as directed. Com pression Wrap: Urgo K2 Lite, (equivalent to a 3 layer) two layer compression system, regular 1 x Per Week/30 Days Discharge Instructions: Apply Urgo K2 Lite as directed (alternative to 3 layer compression). WOUND #4: - Lower Leg Wound Laterality: Left, Medial, Distal Cleanser: Soap and Water 1 x Per Week/30 Days Discharge Instructions: May shower and wash wound with dial antibacterial soap and water prior to dressing change. Peri-Wound Care: Triamcinolone 15 (g) 1 x Per Week/30 Days Discharge Instructions: Use triamcinolone 15 (g) as directed Peri-Wound Care: Sween Lotion (Moisturizing lotion) 1 x Per Week/30 Days Discharge Instructions: Apply moisturizing lotion as directed Topical: Gentamicin 1 x Per Week/30 Days Discharge Instructions: As directed by physician Topical: Mupirocin Ointment 1 x Per Week/30 Days Discharge Instructions: Apply Mupirocin (Bactroban) as instructed Prim Dressing: Hydrofera Blue Ready Transfer Foam, 2.5x2.5 (in/in) 1 x Per Week/30 Days ary Discharge Instructions: Apply directly to wound bed as directed Secondary  Dressing: Woven Gauze Sponge, Non-Sterile 4x4 in 1 x Per Week/30 Days Discharge Instructions: Apply over primary dressing as directed. Com pression Wrap: Urgo K2 Lite, (equivalent to a 3 layer) two layer compression system, regular 1 x Per Week/30 Days Discharge Instructions: Apply Urgo K2 Lite as directed (alternative to 3 layer compression). 1. Antibiotic ointment with Hydrofera Blue under 3 layer compression to the lower extremities bilaterally 2. Follow-up in 1 week Electronic Signature(s) Signed: 02/11/2023 12:25:26 PM By: [name] [name] Corwin DO Entered By: [name] [name] [name] on 02/11/2023 09:45:56 -------------------------------------------------------------------------------- HxROS Details Patient Name: Date of Service: [name] [name] [name] [name] [name], [name] [name] RRIS M. 02/11/2023 8:00 A M Medical Record Number: [id number] Patient Account Number: [id number] Date of Birth/Sex: Treating RN: 05-01-1956 (67 y.o. M) Primary Care Provider: Cranford [name] Other Clinician: Referring Provider: Treating Provider/Extender: [name] [name] [name] in Treatment: 7 Hematologic/Lymphatic Medical History: Positive for: Anemia; Lymphedema Cardiovascular Medical History: Positive for: Hypertension Gastrointestinal Medical  History: Positive for: Cirrhosis - crpytogenic Past Medical History Notes: GERD Endocrine Medical History: Positive for: Type II Diabetes - 6.2 HgbA1c Time with diabetes: 20 yeatrs Treated with: Insulin [name] [name] [name], [name] [name] [name] ([id number]) [id number] Page 8 of 9 Genitourinary Medical History: Past Medical History Notes: stage III CKD Immunizations Pneumococcal Vaccine: Received Pneumococcal Vaccination: [name] [name] Implantable Devices [name] [name] devices added Family and Social History Diabetes: Yes - Father; Heart Disease: Yes - Father; Current every day smoker; Marital Status - Single; Alcohol Use: Never; Drug Use: [name] [name] History; Caffeine Use: Never; Financial Concerns: [name] [name]; Food, Clothing  or Shelter Needs: [name] [name]; Support System Lacking: [name] [name]; Transportation Concerns: [name] [name] Electronic Signature(s) Signed: 02/11/2023 12:25:26 PM By: [name] [name] Corwin DO Entered By: [name] [name] [name] on 02/11/2023 09:43:49 -------------------------------------------------------------------------------- SuperBill Details Patient Name: Date of Service: [name] [name] [name], [name] [name] RRIS M. 02/11/2023 Medical Record Number: [id number] Patient Account Number: [id number] Date of Birth/Sex: Treating RN: 03-Jul-1956 (67 y.o. [name] [name] [name], [name] [name] [name].[place] Primary Care Provider: Cranford [name] Other Clinician: Referring Provider: Treating Provider/Extender: [name] [name] [name] in Treatment: 7 Diagnosis Coding ICD-10 Codes Code Description [phone] Chronic venous hypertension (idiopathic) with ulcer of bilateral lower extremity L97.818 Non-pressure chronic ulcer of other part of right lower leg with other specified severity L97.828 Non-pressure chronic ulcer of other part of left lower leg with other specified severity I89.0 Lymphedema, not elsewhere classified E11.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 other skin ulcer K74.69 Other cirrhosis of liver Facility Procedures : CPT4: Code [id number] Description: 81 BILATERAL: Application of multi-layer venous compression system; leg (below knee), including ankle and t. Modifier: Quantity: 1 Physician Procedures : CPT4 Code Description Modifier [id number] - WC PHYS LEVEL 3 - EST PT ICD-10 Diagnosis Description I87.313 Chronic venous hypertension (idiopathic) with ulcer of bilateral lower extremity L97.818 Non-pressure chronic ulcer of other part of right  lower leg with other specified severity L97.828 Non-pressure chronic ulcer of other part of left lower leg with other specified severity E11.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 other skin ulcer Quantity: 1 Electronic Signature(s) Signed: 02/11/2023 12:25:26 PM By: [name] [name] Corwin DO Entered By: [name] [name] [name] on 02/11/2023  09:46:09 [name] [name] [name] ([id number]) [id number] Page 9 of 9

## 2023-02-11 NOTE — Progress Notes (Signed)
[name], [name] ([id number]) [id number] Page 1 of 14 Visit Report for 02/11/2023 Arrival Information Details Patient Name: Date of Service: [name] 02/11/2023 8:00 A M Medical Record Number: [id number] Patient Account Number: [id number] Date of Birth/Sex: Treating RN: [birth year] (67 y.o. [name] Primary Care Elina Streng: [place] Other Clinician: Referring Alzada Brazee: Treating Clydia Nieves/Extender: Shaune Pollack in Treatment: 7 Visit Information History Since Last Visit Added or deleted any medications: No Patient Arrived: Ambulatory Any new allergies or adverse reactions: No Arrival Time: 08:04 Had a fall or experienced change in No Accompanied By: wife activities of daily living that may affect Transfer Assistance: None risk of falls: Patient Identification Verified: Yes Signs or symptoms of abuse/neglect since last visito No Secondary Verification Process Completed: Yes Hospitalized since last visit: No Patient Requires Transmission-Based No Implantable device outside of the clinic excluding No Precautions: cellular tissue based products placed in the center Patient Has Alerts: Yes since last visit: Patient Alerts: 10/23 ABI L0.94 R1 VVS Has Dressing in Place as Prescribed: Yes 10/23 TBI L1.2 R0.88 VVS Has Compression in Place as Prescribed: Yes Pain Present Now: No Electronic Signature(s) Signed: 02/11/2023 11:42:02 AM By: Redmond Pulling RN, BSN Entered By: Redmond Pulling on 02/11/2023 08:04:47 -------------------------------------------------------------------------------- Compression Therapy Details Patient Name: Date of Service: [name] RRIS M. 02/11/2023 8:00 A M Medical Record Number: [id number] Patient Account Number: [id number] Date of Birth/Sex: Treating RN: [birth year] (67 y.o. [name] Primary Care Brenyn Petrey: [place] Other Clinician: Referring Manahil Vanzile: Treating Equilla Que/Extender:  Shaune Pollack in Treatment: 7 Compression Therapy Performed for Wound Assessment: Wound #3 Right,Proximal,Lateral Lower Leg Performed By: Clinician [name], RN Compression Type: Double Layer Post Procedure Diagnosis Same as Pre-procedure Electronic Signature(s) Signed: 02/11/2023 5:30:48 PM By: [name] RN, BSN Entered By: [name] on 02/11/2023 08:59:17 Cay Schillings ([id number]) [id number] Page 2 of 14 -------------------------------------------------------------------------------- Compression Therapy Details Patient Name: Date of Service: [name] [name]. 02/11/2023 8:00 A M Medical Record Number: [id number] Patient Account Number: [id number] Date of Birth/Sex: Treating RN: 1956/01/11 (67 y.o. [name] Primary Care Tedi Hughson: [place] Other Clinician: Referring Treena Cosman: Treating Jerry Clyne/Extender: Shaune Pollack in Treatment: 7 Compression Therapy Performed for Wound Assessment: Wound #4 Left,Distal,Medial Lower Leg Performed By: Clinician [name], RN Compression Type: Double Layer Post Procedure Diagnosis Same as Pre-procedure Electronic Signature(s) Signed: 02/11/2023 5:30:48 PM By: [name] RN, BSN Entered By: [name] on 02/11/2023 08:59:17 -------------------------------------------------------------------------------- Encounter Discharge Information Details Patient Name: Date of Service: [name], [name] 02/11/2023 8:00 A M Medical Record Number: [id number] Patient Account Number: [id number] Date of Birth/Sex: Treating RN: 11/08/1955 (67 y.o. [name] Primary Care Rennie Hack: [place] Other Clinician: Referring Kortny Lirette: Treating Ronak Duquette/Extender: Shaune Pollack in Treatment: 7 Encounter Discharge Information Items Discharge Condition: Stable Ambulatory Status: Ambulatory Discharge Destination: Home Transportation:  Private Auto Accompanied By: sister Schedule Follow-up Appointment: Yes Clinical Summary of Care: Electronic Signature(s) Signed: 02/11/2023 5:30:48 PM By: [name] RN, BSN Entered By: [name] on 02/11/2023 09:03:19 -------------------------------------------------------------------------------- Lower Extremity Assessment Details Patient Name: Date of Service: CA 40, [name] 02/11/2023 8:00 A M Medical Record Number: [id number] Patient Account Number: [id number] Date of Birth/Sex: Treating RN: April 03, 1956 (67 y.o. [name] Primary Care Eiza Canniff: [place] Other Clinician: Referring Alan Riles: Treating Avea Mcgowen/Extender: Lanney Gins Weeks in Treatment: 7 Edema Assessment  Assessed: [Left: No] [Right: No] Edema: [Left: Yes] [Right: Yes] [address] GLENDELL, WARTMAN ([id number]) [id number] Page 3 of 14 Left: Right: Point of Measurement: 37 cm From Medial Instep 33 cm 32.6 cm Ankle Left: Right: Point of Measurement: 13 cm From Medial Instep 22.5 cm 23 cm Vascular Assessment Pulses: Dorsalis Pedis Palpable: [Left:Yes] [Right:Yes] Electronic Signature(s) Signed: 02/11/2023 11:42:02 AM By: Redmond Pulling RN, BSN Entered By: Redmond Pulling on 02/11/2023 08:06:11 -------------------------------------------------------------------------------- Multi Wound Chart Details Patient Name: Date of Service: [name], [name] [name] RRIS M. 02/11/2023 8:00 A M Medical Record Number: [id number] Patient Account Number: [id number] Date of Birth/Sex: Treating RN: 05-13-1956 (67 y.o. M) Primary Care [name]: [place] Other Clinician: Referring Gilmer Kaminsky: Treating Keen Ewalt/Extender: Shaune Pollack in Treatment: 7 Vital Signs Height(in): 72 Pulse(bpm): 51 Weight(lbs): 245 Blood Pressure(mmHg): 136/70 Body Mass Index(BMI): 33.2 Temperature(F): 97.8 Respiratory Rate(breaths/min): 18 [1R:Photos:] Left Lower Leg Right, Lateral  Lower Leg Right, Proximal, Lateral Lower Leg Wound Location: Gradually Appeared Gradually Appeared Gradually Appeared Wounding Event: Diabetic Wound/Ulcer of the Lower Diabetic Wound/Ulcer of the Lower Diabetic Wound/Ulcer of the Lower Primary Etiology: Extremity Extremity Extremity Lymphedema Lymphedema N/A Secondary Etiology: Anemia, Lymphedema, Hypertension, Anemia, Lymphedema, Hypertension, Anemia, Lymphedema, Hypertension, Comorbid History: Cirrhosis , Type II Diabetes Cirrhosis , Type II Diabetes Cirrhosis , Type II Diabetes 11/05/2022 11/07/2022 01/25/2023 Date Acquired: 7 7 1  Weeks of Treatment: Open Healed - Epithelialized Open Wound Status: Yes No No Wound Recurrence: Yes Yes No Clustered Wound: 0 N/A N/A Clustered Quantity: 0.6x0.5x0.1 0x0x0 0.7x0.4x0.1 Measurements L x W x D (cm) 0.236 0 0.22 A (cm) : rea 0.024 0 0.022 Volume (cm) : 95.70% 100.00% -209.90% % Reduction in A rea: 97.80% 100.00% -57.10% % Reduction in Volume: Grade 1 Grade 2 Grade 1 Classification: Small Medium Medium Exudate A mount: Serosanguineous Serosanguineous Serosanguineous Exudate Type: red, brown red, brown red, brown Exudate Color: Distinct, outline attached N/A Distinct, outline attached Wound MarginKAROL, MADRAY ([id number]) [id number] Page 4 of 14 None Present (0%) None Present (0%) None Present (0%) Granulation Amount: N/A N/A N/A Granulation Quality: Large (67-100%) Large (67-100%) Large (67-100%) Necrotic Amount: Eschar, Adherent Slough N/A Adherent Slough Necrotic Tissue: Fat Layer (Subcutaneous Tissue): Yes Fat Layer (Subcutaneous Tissue): Yes Fat Layer (Subcutaneous Tissue): Yes Exposed Structures: Fascia: No Fascia: No Fascia: No Tendon: No Tendon: No Tendon: No Muscle: No Muscle: No Muscle: No Joint: No Joint: No Joint: No Bone: No Bone: No Bone: No None Large (67-100%) Medium (34-66%) Epithelialization: Rash:  Yes Excoriation: No Periwound Skin Texture: Excoriation: No Induration: No Induration: No Callus: No Callus: No Crepitus: No Crepitus: No Rash: No Scarring: No Scarring: No Maceration: No Maceration: No Periwound Skin Moisture: Dry/Scaly: No Dry/Scaly: No Atrophie Blanche: No Atrophie Blanche: No Periwound Skin Color: Cyanosis: No Cyanosis: No Ecchymosis: No Ecchymosis: No Erythema: No Erythema: No Hemosiderin Staining: No Hemosiderin Staining: No Mottled: No Mottled: No Pallor: No Pallor: No Rubor: No Rubor: No N/A N/A Compression Therapy Procedures Performed: Wound Number: 4 N/A N/A Photos: N/A N/A Left, Distal, Medial Lower Leg N/A N/A Wound Location: Gradually Appeared N/A N/A Wounding Event: Diabetic Wound/Ulcer of the Lower N/A N/A Primary Etiology: Extremity Venous Leg Ulcer N/A N/A Secondary Etiology: Anemia, Lymphedema, Hypertension, N/A N/A Comorbid History: Cirrhosis , Type II Diabetes 02/11/2023 N/A N/A Date Acquired: 0 N/A N/A Weeks of Treatment: Open N/A N/A Wound Status: No N/A N/A Wound Recurrence: No N/A N/A Clustered Wound: N/A N/A N/A Clustered Quantity: 0.7x0.7x0.1 N/A N/A Measurements L x  W x D (cm) 0.385 N/A N/A A (cm) : rea 0.038 N/A N/A Volume (cm) : N/A N/A N/A % Reduction in A rea: N/A N/A N/A % Reduction in Volume: Grade 1 N/A N/A Classification: Medium N/A N/A Exudate A mount: Serosanguineous N/A N/A Exudate Type: red, brown N/A N/A Exudate Color: Distinct, outline attached N/A N/A Wound Margin: Small (1-33%) N/A N/A Granulation A mount: Pink, Pale N/A N/A Granulation Quality: Large (67-100%) N/A N/A Necrotic A mount: Eschar, Adherent Slough N/A N/A Necrotic Tissue: Fascia: No N/A N/A Exposed Structures: Fat Layer (Subcutaneous Tissue): No Tendon: No Muscle: No Joint: No Bone: No Small (1-33%) N/A N/A Epithelialization: Scarring: Yes N/A N/A Periwound Skin Texture: Excoriation:  No Induration: No Callus: No Crepitus: No Rash: No Maceration: No N/A N/A Periwound Skin Moisture: Dry/Scaly: No Hemosiderin Staining: Yes N/A N/A Periwound Skin Color: Atrophie Blanche: No Cyanosis: No Ecchymosis: No Erythema: No DOROTEO, SYDNEY ([id number]) [id number] Page 5 of 14 Mottled: No Pallor: No Rubor: No Compression Therapy N/A N/A Procedures Performed: Treatment Notes Wound #2 (Lower Leg) Wound Laterality: Right, Lateral Cleanser Peri-Wound Care Topical Primary Dressing Secondary Dressing Secured With Compression Wrap Compression Stockings Add-Ons Wound #3 (Lower Leg) Wound Laterality: Right, Lateral, Proximal Cleanser Soap and Water Discharge Instruction: May shower and wash wound with dial antibacterial soap and water prior to dressing change. Peri-Wound Care Triamcinolone 15 (g) Discharge Instruction: Use triamcinolone 15 (g) as directed Sween Lotion (Moisturizing lotion) Discharge Instruction: Apply moisturizing lotion as directed Topical Gentamicin Discharge Instruction: As directed by physician Mupirocin Ointment Discharge Instruction: Apply Mupirocin (Bactroban) as instructed Primary Dressing Hydrofera Blue Ready Transfer Foam, 2.5x2.5 (in/in) Discharge Instruction: Apply directly to wound bed as directed Secondary Dressing Woven Gauze Sponge, Non-Sterile 4x4 in Discharge Instruction: Apply over primary dressing as directed. Secured With Compression Wrap Urgo K2 Lite, (equivalent to a 3 layer) two layer compression system, regular Discharge Instruction: Apply Urgo K2 Lite as directed (alternative to 3 layer compression). Compression Stockings Add-Ons Wound #4 (Lower Leg) Wound Laterality: Left, Medial, Distal Cleanser Soap and Water Discharge Instruction: May shower and wash wound with dial antibacterial soap and water prior to dressing change. Peri-Wound Care Triamcinolone 15 (g) Discharge Instruction: Use  triamcinolone 15 (g) as directed Sween Lotion (Moisturizing lotion) Discharge Instruction: Apply moisturizing lotion as directed Topical TAAJ, JONCAS ([id number]) [id number] Page 6 of 14 Gentamicin Discharge Instruction: As directed by physician Mupirocin Ointment Discharge Instruction: Apply Mupirocin (Bactroban) as instructed Primary Dressing Hydrofera Blue Ready Transfer Foam, 2.5x2.5 (in/in) Discharge Instruction: Apply directly to wound bed as directed Secondary Dressing Woven Gauze Sponge, Non-Sterile 4x4 in Discharge Instruction: Apply over primary dressing as directed. Secured With Compression Wrap Urgo K2 Lite, (equivalent to a 3 layer) two layer compression system, regular Discharge Instruction: Apply Urgo K2 Lite as directed (alternative to 3 layer compression). Compression Stockings Add-Ons Electronic Signature(s) Signed: 02/11/2023 12:25:26 PM By: Geralyn Corwin DO Entered By: Geralyn Corwin on 02/11/2023 09:40:33 -------------------------------------------------------------------------------- Multi-Disciplinary Care Plan Details Patient Name: Date of Service: [name], [name] 02/11/2023 8:00 A M Medical Record Number: [id number] Patient Account Number: [id number] Date of Birth/Sex: Treating RN: Dec 26, 1955 (67 y.o. [name] Primary Care Tashina Credit: [place] Other Clinician: Referring Ari Engelbrecht: Treating Jamey Harman/Extender: Shaune Pollack in Treatment: 7 Active Inactive Pain, Acute or Chronic Nursing Diagnoses: Pain, acute or chronic: actual or potential Potential alteration in comfort, pain Goals: Patient will verbalize adequate pain control and receive pain control interventions during procedures as needed  Date Initiated: 12/24/2022 Target Resolution Date: 03/08/2023 Goal Status: Active Patient/caregiver will verbalize comfort level met Date Initiated: 12/24/2022 Date Inactivated: 01/31/2023 Target  Resolution Date: 02/14/2023 Goal Status: Met Interventions: Encourage patient to take pain medications as prescribed Provide education on pain management Treatment Activities: Administer pain control measures as ordered : 12/24/2022 Notes: Electronic Signature(s) Signed: 02/11/2023 5:30:48 PM By: [name] RN, BSN [place], Milus Height ([id number]) [id number] Page 7 of 14 Entered By: [name] on 02/11/2023 08:26:42 -------------------------------------------------------------------------------- Pain Assessment Details Patient Name: Date of Service: [name] [name]. 02/11/2023 8:00 A M Medical Record Number: [id number] Patient Account Number: [id number] Date of Birth/Sex: Treating RN: Dec 08, 1955 (67 y.o. [name] Primary Care Evangela Heffler: [place] Other Clinician: Referring Bilal Manzer: Treating Fatmata Legere/Extender: Shaune Pollack in Treatment: 7 Active Problems Location of Pain Severity and Description of Pain Patient Has Paino No Site Locations Pain Management and Medication Current Pain Management: Electronic Signature(s) Signed: 02/11/2023 11:42:02 AM By: Redmond Pulling RN, BSN Entered By: Redmond Pulling on 02/11/2023 08:05:12 -------------------------------------------------------------------------------- Patient/Caregiver Education Details Patient Name: Date of Service: [name], [name] 7/8/2024andnbsp8:00 A M Medical Record Number: [id number] Patient Account Number: [id number] Date of Birth/Gender: Treating RN: August 16, 1955 (67 y.o. [name] Primary Care Physician: [place] Mon Other Clinician: Referring Physician: Treating Physician/Extender: Shaune Pollack in Treatment: 7 Education Assessment Education Provided To: Patient Education Topics Provided Wound/Skin ImpairmentHABEN, BILLINGSLEY ([id number]) [id number] Page 8 of 14 Handouts: Caring for Your  Ulcer Methods: Explain/Verbal Responses: Reinforcements needed Electronic Signature(s) Signed: 02/11/2023 5:30:48 PM By: [name] RN, BSN Entered By: [name] on 02/11/2023 08:26:54 -------------------------------------------------------------------------------- Wound Assessment Details Patient Name: Date of Service: [name], [name] 02/11/2023 8:00 A M Medical Record Number: [id number] Patient Account Number: [id number] Date of Birth/Sex: Treating RN: [birth year] (67 y.o. [name] Primary Care Paitynn Mikus: [place] Other Clinician: Referring Chauntae Hults: Treating Aleysha Meckler/Extender: Lanney Gins Weeks in Treatment: 7 Wound Status Wound Number: 1R Primary Etiology: Diabetic Wound/Ulcer of the Lower Extremity Wound Location: Left Lower Leg Secondary Lymphedema Etiology: Wounding Event: Gradually Appeared Wound Status: Open Date Acquired: 11/05/2022 Comorbid Anemia, Lymphedema, Hypertension, Cirrhosis , Type II Weeks Of Treatment: 7 History: Diabetes Clustered Wound: Yes Photos Wound Measurements Length: (cm) 0.6 Width: (cm) 0.5 Depth: (cm) 0.1 Clustered Quantity: 0 Area: (cm) 0.236 Volume: (cm) 0.024 % Reduction in Area: 95.7% % Reduction in Volume: 97.8% Epithelialization: None Tunneling: No Undermining: No Wound Description Classification: Grade 1 Wound Margin: Distinct, outline attached Exudate Amount: Small Exudate Type: Serosanguineous Exudate Color: red, brown Foul Odor After Cleansing: No Slough/Fibrino Yes Wound Bed Granulation Amount: None Present (0%) Exposed Structure Necrotic Amount: Large (67-100%) Fascia Exposed: No Necrotic Quality: Eschar, Adherent Slough Fat Layer (Subcutaneous Tissue) Exposed: Yes Tendon Exposed: No Muscle Exposed: No Joint Exposed: No Bone Exposed: No [address] JEANMICHAEL, HOLWAY ([id number]) [id number] Page 9 of 14 Texture Color No Abnormalities Noted:  No No Abnormalities Noted: No Callus: No Atrophie Blanche: No Crepitus: No Cyanosis: No Excoriation: No Ecchymosis: No Induration: No Erythema: No Rash: Yes Hemosiderin Staining: No Scarring: No Mottled: No Pallor: No Moisture Rubor: No No Abnormalities Noted: No Dry / Scaly: No Maceration: No Electronic Signature(s) Signed: 02/11/2023 11:42:02 AM By: Redmond Pulling RN, BSN Signed: 02/11/2023 5:30:48 PM By: [name] RN, BSN Entered By: [name] on 02/11/2023 08:12:29 -------------------------------------------------------------------------------- Wound Assessment Details Patient Name: Date of Service: [name], NO RRIS  M. 02/11/2023 8:00 A M Medical Record Number: [id number] Patient Account Number: [id number] Date of Birth/Sex: Treating RN: 1955/09/01 (67 y.o. [name], [name] Primary Care Teralyn Mullins: [place] Mon Other Clinician: Referring Sheriann Newmann: Treating Jearline Hirschhorn/Extender: Shaune Pollack in Treatment: 7 Wound Status Wound Number: 2 Primary Etiology: Diabetic Wound/Ulcer of the Lower Extremity Wound Location: Right, Lateral Lower Leg Secondary Lymphedema Etiology: Wounding Event: Gradually Appeared Wound Status: Healed - Epithelialized Date Acquired: 11/07/2022 Comorbid Anemia, Lymphedema, Hypertension, Cirrhosis , Type II Weeks Of Treatment: 7 History: Diabetes Clustered Wound: Yes Photos Wound Measurements Length: (cm) Width: (cm) Depth: (cm) Area: (cm) Volume: (cm) 0 % Reduction in Area: 100% 0 % Reduction in Volume: 100% 0 Epithelialization: Large (67-100%) 0 Tunneling: No 0 Undermining: No Wound Description Classification: Grade 2 Exudate Amount: Medium Exudate Type: Serosanguineous Exudate Color: red, brown Foul Odor After Cleansing: No Slough/Fibrino Yes Wound Bed Granulation Amount: None Present (0%) Exposed Structure Necrotic Amount: Large (67-100%) Fascia Exposed: No SHYON, SLAGTER ([id number])  [id number] Page 10 of 14 Fat Layer (Subcutaneous Tissue) Exposed: Yes Tendon Exposed: No Muscle Exposed: No Joint Exposed: No Bone Exposed: No Periwound Skin Texture Texture Color No Abnormalities Noted: No No Abnormalities Noted: No Moisture No Abnormalities Noted: No Treatment Notes Wound #2 (Lower Leg) Wound Laterality: Right, Lateral Cleanser Peri-Wound Care Topical Primary Dressing Secondary Dressing Secured With Compression Wrap Compression Stockings Add-Ons Electronic Signature(s) Signed: 02/11/2023 5:30:48 PM By: [name] RN, BSN Entered By: [name] on 02/11/2023 08:56:32 -------------------------------------------------------------------------------- Wound Assessment Details Patient Name: Date of Service: [name], [name] 02/11/2023 8:00 A M Medical Record Number: [id number] Patient Account Number: [id number] Date of Birth/Sex: Treating RN: 05-11-1956 (67 y.o. [name] Primary Care Kolby Myung: [place] Other Clinician: Referring Emsley Custer: Treating Danzig Macgregor/Extender: Lanney Gins Weeks in Treatment: 7 Wound Status Wound Number: 3 Primary Diabetic Wound/Ulcer of the Lower Extremity Etiology: Wound Location: Right, Proximal, Lateral Lower Leg Wound Status: Open Wounding Event: Gradually Appeared Comorbid Anemia, Lymphedema, Hypertension, Cirrhosis , Type II Date Acquired: 01/25/2023 History: Diabetes Weeks Of Treatment: 1 Clustered Wound: No Photos RIP, SCHOENBAUER ([id number]) [id number] Page 11 of 14 Wound Measurements Length: (cm) 0.7 Width: (cm) 0.4 Depth: (cm) 0.1 Area: (cm) 0.22 Volume: (cm) 0.022 % Reduction in Area: -209.9% % Reduction in Volume: -57.1% Epithelialization: Medium (34-66%) Tunneling: No Undermining: No Wound Description Classification: Grade 1 Wound Margin: Distinct, outline attached Exudate Amount: Medium Exudate Type:  Serosanguineous Exudate Color: red, brown Foul Odor After Cleansing: No Slough/Fibrino Yes Wound Bed Granulation Amount: None Present (0%) Exposed Structure Necrotic Amount: Large (67-100%) Fascia Exposed: No Necrotic Quality: Adherent Slough Fat Layer (Subcutaneous Tissue) Exposed: Yes Tendon Exposed: No Muscle Exposed: No Joint Exposed: No Bone Exposed: No Periwound Skin Texture Texture Color No Abnormalities Noted: No No Abnormalities Noted: No Callus: No Atrophie Blanche: No Crepitus: No Cyanosis: No Excoriation: No Ecchymosis: No Induration: No Erythema: No Rash: No Hemosiderin Staining: No Scarring: No Mottled: No Pallor: No Moisture Rubor: No No Abnormalities Noted: No Dry / Scaly: No Maceration: No Treatment Notes Wound #3 (Lower Leg) Wound Laterality: Right, Lateral, Proximal Cleanser Soap and Water Discharge Instruction: May shower and wash wound with dial antibacterial soap and water prior to dressing change. Peri-Wound Care Triamcinolone 15 (g) Discharge Instruction: Use triamcinolone 15 (g) as directed Sween Lotion (Moisturizing lotion) Discharge Instruction: Apply moisturizing lotion as directed Topical Gentamicin Discharge Instruction: As directed by physician Mupirocin Ointment Discharge Instruction: Apply Mupirocin (Bactroban) as instructed  Primary Dressing Hydrofera Blue Ready Transfer Foam, 2.5x2.5 (in/in) Discharge Instruction: Apply directly to wound bed as directed Secondary Dressing Woven Gauze Sponge, Non-Sterile 4x4 in Discharge Instruction: Apply over primary dressing as directed. Secured With Compression Wrap Urgo K2 Lite, (equivalent to a 3 layer) two layer compression system, regular Discharge Instruction: Apply Urgo K2 Lite as directed (alternative to 3 layer compression). Compression Stockings JEAROLD, BORZA [place] ([id number]) [id number] Page 12 of 14 Add-Ons Electronic Signature(s) Signed: 02/11/2023  11:42:02 AM By: Redmond Pulling RN, BSN Signed: 02/11/2023 5:30:48 PM By: [name] RN, BSN Entered By: [name] on 02/11/2023 08:59:42 -------------------------------------------------------------------------------- Wound Assessment Details Patient Name: Date of Service: [name], [name] 02/11/2023 8:00 A M Medical Record Number: [id number] Patient Account Number: [id number] Date of Birth/Sex: Treating RN: [birth year] (67 y.o. [name], [name] Primary Care Akyia Borelli: [place] Mon Other Clinician: Referring Bea Duren: Treating Dalores Weger/Extender: Lanney Gins Weeks in Treatment: 7 Wound Status Wound Number: 4 Primary Etiology: Diabetic Wound/Ulcer of the Lower Extremity Wound Location: Left, Distal, Medial Lower Leg Secondary Venous Leg Ulcer Etiology: Wounding Event: Gradually Appeared Wound Status: Open Date Acquired: 02/11/2023 Comorbid Anemia, Lymphedema, Hypertension, Cirrhosis , Type II Weeks Of Treatment: 0 History: Diabetes Clustered Wound: No Photos Wound Measurements Length: (cm) 0.7 Width: (cm) 0.7 Depth: (cm) 0.1 Area: (cm) 0.385 Volume: (cm) 0.038 % Reduction in Area: % Reduction in Volume: Epithelialization: Small (1-33%) Tunneling: No Undermining: No Wound Description Classification: Grade 1 Wound Margin: Distinct, outline attached Exudate Amount: Medium Exudate Type: Serosanguineous Exudate Color: red, brown Foul Odor After Cleansing: No Slough/Fibrino Yes Wound Bed Granulation Amount: Small (1-33%) Exposed Structure Granulation Quality: Pink, Pale Fascia Exposed: No Necrotic Amount: Large (67-100%) Fat Layer (Subcutaneous Tissue) Exposed: No Necrotic Quality: Eschar, Adherent Slough Tendon Exposed: No Muscle Exposed: No Joint Exposed: No Bone Exposed: No Periwound Skin Texture Texture Color No Abnormalities Noted: No No Abnormalities Noted: No Callus: No Atrophie Blanche: No Crepitus: No Cyanosis: No YAAKOV, SEPPALA  ([id number]) [id number].pdf Page 13 of 14 Excoriation: No Ecchymosis: No Induration: No Erythema: No Rash: No Hemosiderin Staining: Yes Scarring: Yes Mottled: No Pallor: No Moisture Rubor: No No Abnormalities Noted: No Dry / Scaly: No Maceration: No Treatment Notes Wound #4 (Lower Leg) Wound Laterality: Left, Medial, Distal Cleanser Soap and Water Discharge Instruction: May shower and wash wound with dial antibacterial soap and water prior to dressing change. Peri-Wound Care Triamcinolone 15 (g) Discharge Instruction: Use triamcinolone 15 (g) as directed Sween Lotion (Moisturizing lotion) Discharge Instruction: Apply moisturizing lotion as directed Topical Gentamicin Discharge Instruction: As directed by physician Mupirocin Ointment Discharge Instruction: Apply Mupirocin (Bactroban) as instructed Primary Dressing Hydrofera Blue Ready Transfer Foam, 2.5x2.5 (in/in) Discharge Instruction: Apply directly to wound bed as directed Secondary Dressing Woven Gauze Sponge, Non-Sterile 4x4 in Discharge Instruction: Apply over primary dressing as directed. Secured With Compression Wrap Urgo K2 Lite, (equivalent to a 3 layer) two layer compression system, regular Discharge Instruction: Apply Urgo K2 Lite as directed (alternative to 3 layer compression). Compression Stockings Add-Ons Electronic Signature(s) Signed: 02/11/2023 5:30:48 PM By: [name] RN, BSN Entered By: [name] on 02/11/2023 09:00:17 -------------------------------------------------------------------------------- Vitals Details Patient Name: Date of Service: [name], [name] 02/11/2023 8:00 A M Medical Record Number: [id number] Patient Account Number: [id number] Date of Birth/Sex: Treating RN: 11-29-1955 (67 y.o. [name] Primary Care Kemora Pinard: Cranford Mon Other Clinician: Referring Dejour Vos: Treating Pranav Lince/Extender: Shaune Pollack in Treatment:  7 Vital Signs Time Taken:  08:00 Temperature (F): 97.8 Height (in): 72 Pulse (bpm): 51 Weight (lbs): 245 Respiratory Rate (breaths/min): 18 Body Mass Index (BMI): 33.2 Blood Pressure (mmHg): 136/70 FINLAY, SALONEN ([id number]) [phone].pdf Page 14 of 14 Reference Range: 80 - 120 mg / dl Electronic Signature(s) Signed: 02/11/2023 11:42:02 AM By: Redmond Pulling RN, BSN Entered By: Redmond Pulling on 02/11/2023 08:05:06

## 2023-02-13 ENCOUNTER — Encounter (HOSPITAL_COMMUNITY): Payer: Self-pay

## 2023-02-13 ENCOUNTER — Ambulatory Visit (HOSPITAL_COMMUNITY): Admission: RE | Admit: 2023-02-13 | Payer: Medicare Other | Source: Ambulatory Visit

## 2023-02-18 ENCOUNTER — Encounter (HOSPITAL_BASED_OUTPATIENT_CLINIC_OR_DEPARTMENT_OTHER): Payer: Medicare Other | Admitting: Internal Medicine

## 2023-02-18 ENCOUNTER — Encounter (INDEPENDENT_AMBULATORY_CARE_PROVIDER_SITE_OTHER): Payer: Medicare Other | Admitting: Ophthalmology

## 2023-02-18 DIAGNOSIS — H35033 Hypertensive retinopathy, bilateral: Secondary | ICD-10-CM

## 2023-02-18 DIAGNOSIS — I10 Essential (primary) hypertension: Secondary | ICD-10-CM

## 2023-02-18 DIAGNOSIS — E113512 Type 2 diabetes mellitus with proliferative diabetic retinopathy with macular edema, left eye: Secondary | ICD-10-CM | POA: Diagnosis not present

## 2023-02-18 DIAGNOSIS — Z794 Long term (current) use of insulin: Secondary | ICD-10-CM

## 2023-02-18 DIAGNOSIS — H43813 Vitreous degeneration, bilateral: Secondary | ICD-10-CM

## 2023-02-18 DIAGNOSIS — E113591 Type 2 diabetes mellitus with proliferative diabetic retinopathy without macular edema, right eye: Secondary | ICD-10-CM | POA: Diagnosis not present

## 2023-02-19 ENCOUNTER — Encounter (HOSPITAL_BASED_OUTPATIENT_CLINIC_OR_DEPARTMENT_OTHER): Payer: Medicare Other | Admitting: Internal Medicine

## 2023-02-19 DIAGNOSIS — L97818 Non-pressure chronic ulcer of other part of right lower leg with other specified severity: Secondary | ICD-10-CM

## 2023-02-19 DIAGNOSIS — E11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 other skin ulcer: Secondary | ICD-10-CM

## 2023-02-19 DIAGNOSIS — L97828 Non-pressure chronic ulcer of other part of left lower leg with other specified severity: Secondary | ICD-10-CM

## 2023-02-19 DIAGNOSIS — I87313 Chronic venous hypertension (idiopathic) with ulcer of bilateral lower extremity: Secondary | ICD-10-CM | POA: Diagnosis not present

## 2023-02-20 NOTE — Progress Notes (Addendum)
QWINTON, ZIMBELMAN ([id number]) [id number] Page 1 of 13 Visit Report for 02/19/2023 Chief Complaint Document Details Patient Name: Date of Service: [name] [name] RRIS M. 02/19/2023 8:00 A M Medical Record Number: [id number] Patient Account Number: [id number] Date of Birth/Sex: Treating RN: 02-16-1956 (67 y.o. M) Primary Care Provider: Cranford Mon Other Clinician: Referring Provider: Treating Provider/Extender: Shaune Pollack in Treatment: 8 Information Obtained from: Patient Chief Complaint 12/24/2022; bilateral lower extremity wounds Electronic Signature(s) Signed: 02/25/2023 4:55:26 PM By: Geralyn Corwin DO Previous Signature: 02/19/2023 4:51:26 PM Version By: Geralyn Corwin DO Entered By: Geralyn Corwin on 02/25/2023 09:28:48 -------------------------------------------------------------------------------- Debridement Details Patient Name: Date of Service: [name] [name], [name] RRIS M. 02/19/2023 8:00 A M Medical Record Number: [id number] Patient Account Number: [id number] Date of Birth/Sex: Treating RN: 1956/03/25 (67 y.o. [name] [name] Primary Care Provider: Cranford Mon Other Clinician: Referring Provider: Treating Provider/Extender: Shaune Pollack in Treatment: 8 Debridement Performed for Assessment: Wound #3 Right,Proximal,Lateral Lower Leg Performed By: Clinician [name], RN Debridement Type: Chemical/Enzymatic/Mechanical Agent Used: Santyl Severity of Tissue Pre Debridement: Fat layer exposed Level of Consciousness (Pre-procedure): Awake and Alert Pre-procedure Verification/Time Out [name] Taken: Percent of Wound Bed Debrided: Bleeding: None Response to Treatment: Procedure was tolerated well Level of Consciousness (Post- Awake and Alert procedure): Post Debridement Measurements of Total Wound Length: (cm) 0.6 Width: (cm) 0.3 Depth: (cm) 0.1 Volume: (cm) 0.014 Character of Wound/Ulcer Post  Debridement: Requires Further Debridement Severity of Tissue Post Debridement: Fat layer exposed Post Procedure Diagnosis Same as Pre-procedure Electronic Signature(s) Signed: 02/19/2023 4:51:26 PM By: Geralyn Corwin DO Signed: 02/19/2023 6:16:33 PM By: [name] RN, BSN Mastandrea, Signed: 02/19/2023 6:16:33 PM By: [name] RN, BSN Milus Height ([id number]) [id number] Page 2 of 13 Entered By: [name] on 02/19/2023 09:32:45 -------------------------------------------------------------------------------- Debridement Details Patient Name: Date of Service: [name] [name]. 02/19/2023 8:00 A M Medical Record Number: [id number] Patient Account Number: [id number] Date of Birth/Sex: Treating RN: 1956-07-30 (67 y.o. [name] [name] Primary Care Provider: Cranford Mon Other Clinician: Referring Provider: Treating Provider/Extender: Shaune Pollack in Treatment: 8 Debridement Performed for Assessment: Wound #4 Left,Distal,Medial Lower Leg Performed By: Clinician [name], RN Debridement Type: Chemical/Enzymatic/Mechanical Agent Used: Santyl Severity of Tissue Pre Debridement: Fat layer exposed Level of Consciousness (Pre-procedure): Awake and Alert Pre-procedure Verification/Time Out [name] Taken: Percent of Wound Bed Debrided: Bleeding: None Response to Treatment: Procedure was tolerated well Level of Consciousness (Post- Awake and Alert procedure): Post Debridement Measurements of Total Wound Length: (cm) 0.7 Width: (cm) 0.6 Depth: (cm) 0.1 Volume: (cm) 0.033 Character of Wound/Ulcer Post Debridement: Requires Further Debridement Severity of Tissue Post Debridement: Fat layer exposed Post Procedure Diagnosis Same as Pre-procedure Electronic Signature(s) Signed: 02/19/2023 4:51:26 PM By: Geralyn Corwin DO Signed: 02/19/2023 6:16:33 PM By: [name] RN, BSN Entered By: [name] on 02/19/2023  10:04:42 -------------------------------------------------------------------------------- Debridement Details Patient Name: Date of Service: [name] [name], [name] RRIS M. 02/19/2023 8:00 A M Medical Record Number: [id number] Patient Account Number: [id number] Date of Birth/Sex: Treating RN: 08-09-1955 (67 y.o. [name] [name] Primary Care Provider: Cranford Mon Other Clinician: Referring Provider: Treating Provider/Extender: Shaune Pollack in Treatment: 8 Debridement Performed for Assessment: Wound #5 Right,Distal,Lateral Lower Leg Performed By: Clinician [name], RN Debridement Type: Chemical/Enzymatic/Mechanical Agent Used: Santyl Severity of Tissue Pre Debridement: Fat layer exposed Level of Consciousness (Pre-procedure): Awake and Alert Pre-procedure Verification/Time Out [name] Taken: Percent of Wound  Bed Debrided: Bleeding: None Response to Treatment: Procedure was tolerated well [name] [name] ([id number]) [id number] Page 3 of 13 Level of Consciousness (Post- Awake and Alert procedure): Post Debridement Measurements of Total Wound Length: (cm) 0.9 Width: (cm) 0.9 Depth: (cm) 0.1 Volume: (cm) 0.064 Character of Wound/Ulcer Post Debridement: Requires Further Debridement Severity of Tissue Post Debridement: Fat layer exposed Post Procedure Diagnosis Same as Pre-procedure Electronic Signature(s) Signed: 02/19/2023 4:51:26 PM By: Geralyn Corwin DO Signed: 02/19/2023 6:16:33 PM By: [name] RN, BSN Entered By: [name] on 02/19/2023 10:04:59 -------------------------------------------------------------------------------- Debridement Details Patient Name: Date of Service: [name] [name], [name] RRIS M. 02/19/2023 8:00 A M Medical Record Number: [id number] Patient Account Number: [id number] Date of Birth/Sex: Treating RN: [birth year] (67 y.o. [name] [name] Primary Care Provider: Cranford Mon Other Clinician: Referring  Provider: Treating Provider/Extender: Shaune Pollack in Treatment: 8 Debridement Performed for Assessment: Wound #6 Left,Proximal,Posterior Lower Leg Performed By: Clinician [name], RN Debridement Type: Chemical/Enzymatic/Mechanical Agent Used: Santyl Severity of Tissue Pre Debridement: Fat layer exposed Level of Consciousness (Pre-procedure): Awake and Alert Pre-procedure Verification/Time Out [name] Taken: Percent of Wound Bed Debrided: Bleeding: None Response to Treatment: Procedure was tolerated well Level of Consciousness (Post- Awake and Alert procedure): Post Debridement Measurements of Total Wound Length: (cm) 1.4 Width: (cm) 1.5 Depth: (cm) 0.1 Volume: (cm) 0.165 Character of Wound/Ulcer Post Debridement: Requires Further Debridement Severity of Tissue Post Debridement: Fat layer exposed Post Procedure Diagnosis Same as Pre-procedure Electronic Signature(s) Signed: 02/19/2023 4:51:26 PM By: Geralyn Corwin DO Signed: 02/19/2023 6:16:33 PM By: [name] RN, BSN Entered By: [name] on 02/19/2023 10:05:20 [name] [name] ([id number]) [id number] Page 4 of 13 -------------------------------------------------------------------------------- Debridement Details Patient Name: Date of Service: [name] [name]. 02/19/2023 8:00 A M Medical Record Number: [id number] Patient Account Number: [id number] Date of Birth/Sex: Treating RN: 1956-05-23 (67 y.o. [name] [name] Primary Care Provider: Cranford Mon Other Clinician: Referring Provider: Treating Provider/Extender: Shaune Pollack in Treatment: 8 Debridement Performed for Assessment: Wound #7 Left,Distal,Posterior Lower Leg Performed By: Clinician [name], RN Debridement Type: Chemical/Enzymatic/Mechanical Agent Used: Santyl Severity of Tissue Pre Debridement: Fat layer exposed Level of Consciousness (Pre-procedure): Awake and  Alert Pre-procedure Verification/Time Out [name] Taken: Percent of Wound Bed Debrided: Bleeding: None Response to Treatment: Procedure was tolerated well Level of Consciousness (Post- Awake and Alert procedure): Post Debridement Measurements of Total Wound Length: (cm) 0.8 Width: (cm) 1 Depth: (cm) 0.1 Volume: (cm) 0.063 Character of Wound/Ulcer Post Debridement: Requires Further Debridement Severity of Tissue Post Debridement: Fat layer exposed Post Procedure Diagnosis Same as Pre-procedure Electronic Signature(s) Signed: 02/19/2023 4:51:26 PM By: Geralyn Corwin DO Signed: 02/19/2023 6:16:33 PM By: [name] RN, BSN Entered By: [name] on 02/19/2023 10:05:48 -------------------------------------------------------------------------------- HPI Details Patient Name: Date of Service: [name] [name], [name] RRIS M. 02/19/2023 8:00 A M Medical Record Number: [id number] Patient Account Number: [id number] Date of Birth/Sex: Treating RN: 05-02-1956 (67 y.o. M) Primary Care Provider: Cranford Mon Other Clinician: Referring Provider: Treating Provider/Extender: Shaune Pollack in Treatment: 8 History of Present Illness HPI Description: 12/24/2022 Mr. Karma Streitz is a 67 year old male with a past medical history of controlled type 2 diabetes on insulin, lymphedema/chronic venous insufficiency and cirrhosis that presents to the clinic for a 1 year history of nonhealing wounds to his lower extremities bilaterally. He reports the starting spontaneously. He states that he has been following with [facility] wound care center and they  have been using [facility] antibiotic spray to the legs. He is not wearing compression stockings or wraps. He is also tried Medihoney and collagen in the past with little benefit. He currently denies systemic signs of infection. 5/30; patient presents for follow-up. He has been using triamcinolone cream to the periwound and Santyl to the wound beds. There has  been improvement in wound healing. He denies signs of infection and has [name] issues or complaints today. 6/4; both legs look considerably better. He has a wound on the right lateral lower leg and the left medial lower leg. Significant hemosiderin in the right leg less so on the left. We have been using Hydrofera Blue under compression 6/13; patient presents for follow-up. We have been using antibiotic ointment with Hydrofera Blue under compression therapy. The wounds are smaller. He has [name] issues or complaints today. We discussed ordering juxta lite compression garment wraps T use once his wounds heal as he has hard time putting on o compression stockings. He was agreeable with this. 6/20; patient presents for follow-up. We have been using antibiotic ointment with Hydrofera Blue under compression therapy to the lower extremities bilaterally. Wounds are smaller. He has been approved for PuraPly and was agreeable with placement today. He denies signs of infection. [name] [name], [name] [name] ([id number]) [id number] Page 5 of 13 6/27; patient presents for follow-up. We have been using antibiotic ointment with Hydrofera Blue under compression therapy to the left lower extremity. This wound is healed. He has his juxta lite compression wraps with him today. T the right lateral leg we have been using PuraPly under compression and that wound o is smaller today. Unfortunately he has developed skin breakdown from the Steri-Strip that was used to hold the PuraPly in place and this has created a new wound on the right leg. 7/8; patient presents for follow-up. We have been using PuraPly to the right lateral leg wound Under compression wrap. Wound is slightly smaller however original wound used for PuraPly is healed. He unfortunately developed a new wound to the left lower extremity but it looks like this was from potential scratching. He has been using his juxta lite compression here. 716;  patient presents for follow-up. We have been using antibiotic ointment with Hydrofera Blue under 3 layer compression to lower extremities bilaterally. Unfortunately he has continued to develop wounds to the left lower extremity. He currently denies signs of infection. Electronic Signature(s) Signed: 02/25/2023 4:55:26 PM By: Geralyn Corwin DO Entered By: Geralyn Corwin on 02/25/2023 09:29:44 -------------------------------------------------------------------------------- Physical Exam Details Patient Name: Date of Service: [name] [name], [name] RRIS M. 02/19/2023 8:00 A M Medical Record Number: [id number] Patient Account Number: [id number] Date of Birth/Sex: Treating RN: [birth year] (67 y.o. M) Primary Care Provider: Cranford Mon Other Clinician: Referring Provider: Treating Provider/Extender: Shaune Pollack in Treatment: 8 Constitutional respirations regular, non-labored and within target range for patient.. Cardiovascular 2+ dorsalis pedis/posterior tibialis pulses. Psychiatric pleasant and cooperative. Notes Right lower extremity: 2 open wounds with granulation tissue and nonviable tissue. [name] surrounding signs of infection. Venous stasis dermatitis. Left lower extremity: 3 open wounds with granulation tissue and nonviable tissue. Again [name] signs of surrounding infection. Venous stasis dermatitis. Decent edema control. Electronic Signature(s) Signed: 02/25/2023 4:55:26 PM By: Geralyn Corwin DO Entered By: Geralyn Corwin on 02/25/2023 09:32:37 -------------------------------------------------------------------------------- Physician Orders Details Patient Name: Date of Service: [name] [name], [name] RRIS M. 02/19/2023 8:00 A M Medical Record Number: [id number] Patient Account Number: [id number] Date of Birth/Sex: Treating RN: 1955/10/31 (67 y.o.  [name] [name] Primary Care Provider: Cranford Mon Other Clinician: Referring Provider: Treating Provider/Extender: Shaune Pollack in Treatment: 8 Verbal / Phone Orders: [name] Diagnosis Coding ICD-10 Coding Code Description I87.313 Chronic venous hypertension (idiopathic) with ulcer of bilateral lower extremity L97.818 Non-pressure chronic ulcer of other part of right lower leg with other specified severity [name] [name], [name] [name] ([id number]) [id number] Page 6 of 13 [id number] Non-pressure chronic ulcer of other part of left lower leg with other specified severity I89.0 Lymphedema, not elsewhere classified E11.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 other skin ulcer K74.69 Other cirrhosis of liver Follow-up Appointments ppointment in 1 week. - 02/25/2023 845 Dr. Mikey Bussing 0845 Monday Return A ppointment in 2 weeks. - Dr. Mikey Bussing and Yvonne Kendall Return A Room 7 Monday 03/04/2023 0845 Anesthetic (In clinic) Topical Lidocaine 4% applied to wound bed Bathing/ Shower/ Hygiene May shower with protection but do not get wound dressing(s) wet. Protect dressing(s) with water repellant cover (for example, large plastic bag) or a cast cover and may then take shower. Edema Control - Lymphedema / SCD / Other Elevate legs to the level of the heart or above for 30 minutes daily and/or when sitting for 3-4 times a day throughout the day. Avoid standing for long periods of time. Patient to wear own compression stockings every day. Exercise regularly Compression stocking or Garment 30-40 mm/Hg pressure to: - wear juxatlite HDs both legs. Wound Treatment Wound #3 - Lower Leg Wound Laterality: Right, Lateral, Proximal Cleanser: Soap and Water 1 x Per Day/30 Days Discharge Instructions: May shower and wash wound with dial antibacterial soap and water prior to dressing change. Peri-Wound Care: Triamcinolone 15 (g) 1 x Per Day/30 Days Discharge Instructions: Use triamcinolone apply to entire both legs. around the wounds. Prim Dressing: Hydrofera Blue Ready Transfer Foam, 2.5x2.5 (in/in) 1 x Per Day/30  Days ary Discharge Instructions: Apply directly to wound bed as directed Prim Dressing: Santyl Ointment 1 x Per Day/30 Days ary Discharge Instructions: Apply nickel thick amount to wound bed as instructed Secondary Dressing: ABD Pad, 8x10 1 x Per Day/30 Days Discharge Instructions: Apply over primary dressing as directed. Secured With: [employer], 4.5x3.1 (in/yd) 1 x Per Day/30 Days Discharge Instructions: Secure with Kerlix as directed. Secured With: 75M Medipore H Soft Cloth Surgical T ape, 4 x 10 (in/yd) 1 x Per Day/30 Days Discharge Instructions: Secure with tape as directed. Wound #4 - Lower Leg Wound Laterality: Left, Medial, Distal Cleanser: Soap and Water 1 x Per Day/30 Days Discharge Instructions: May shower and wash wound with dial antibacterial soap and water prior to dressing change. Peri-Wound Care: Triamcinolone 15 (g) 1 x Per Day/30 Days Discharge Instructions: Use triamcinolone apply to entire both legs. around the wounds. Prim Dressing: Hydrofera Blue Ready Transfer Foam, 2.5x2.5 (in/in) 1 x Per Day/30 Days ary Discharge Instructions: Apply directly to wound bed as directed Prim Dressing: Santyl Ointment 1 x Per Day/30 Days ary Discharge Instructions: Apply nickel thick amount to wound bed as instructed Secondary Dressing: ABD Pad, 8x10 1 x Per Day/30 Days Discharge Instructions: Apply over primary dressing as directed. Secured With: [employer], 4.5x3.1 (in/yd) 1 x Per Day/30 Days Discharge Instructions: Secure with Kerlix as directed. Secured With: 75M Medipore H Soft Cloth Surgical T ape, 4 x 10 (in/yd) 1 x Per Day/30 Days Discharge Instructions: Secure with tape as directed. Wound #5 - Lower Leg Wound Laterality: Right, Lateral, Distal Cleanser: Soap and Water 1 x Per Day/30 Days Discharge Instructions:  May shower and wash wound with dial antibacterial soap and water prior to dressing change. Peri-Wound Care: Triamcinolone 15 (g) 1 x Per Day/30  Days Discharge Instructions: Use triamcinolone apply to entire both legs. around the wounds. Prim Dressing: Hydrofera Blue Ready Transfer Foam, 2.5x2.5 (in/in) ary 1 x Per Day/30 Days AZARIA, ORLANDINI ([id number]) [id number] Page 7 of 13 Discharge Instructions: Apply directly to wound bed as directed Prim Dressing: Santyl Ointment 1 x Per Day/30 Days ary Discharge Instructions: Apply nickel thick amount to wound bed as instructed Secondary Dressing: ABD Pad, 8x10 1 x Per Day/30 Days Discharge Instructions: Apply over primary dressing as directed. Secured With: [employer], 4.5x3.1 (in/yd) 1 x Per Day/30 Days Discharge Instructions: Secure with Kerlix as directed. Secured With: 36M Medipore H Soft Cloth Surgical T ape, 4 x 10 (in/yd) 1 x Per Day/30 Days Discharge Instructions: Secure with tape as directed. Wound #6 - Lower Leg Wound Laterality: Left, Posterior, Proximal Cleanser: Soap and Water 1 x Per Day/30 Days Discharge Instructions: May shower and wash wound with dial antibacterial soap and water prior to dressing change. Peri-Wound Care: Triamcinolone 15 (g) 1 x Per Day/30 Days Discharge Instructions: Use triamcinolone apply to entire both legs. around the wounds. Prim Dressing: Hydrofera Blue Ready Transfer Foam, 2.5x2.5 (in/in) 1 x Per Day/30 Days ary Discharge Instructions: Apply directly to wound bed as directed Prim Dressing: Santyl Ointment 1 x Per Day/30 Days ary Discharge Instructions: Apply nickel thick amount to wound bed as instructed Secondary Dressing: ABD Pad, 8x10 1 x Per Day/30 Days Discharge Instructions: Apply over primary dressing as directed. Secured With: [employer], 4.5x3.1 (in/yd) 1 x Per Day/30 Days Discharge Instructions: Secure with Kerlix as directed. Secured With: 36M Medipore H Soft Cloth Surgical T ape, 4 x 10 (in/yd) 1 x Per Day/30 Days Discharge Instructions: Secure with tape as directed. Wound #7 - Lower  Leg Wound Laterality: Left, Posterior, Distal Cleanser: Soap and Water 1 x Per Day/30 Days Discharge Instructions: May shower and wash wound with dial antibacterial soap and water prior to dressing change. Peri-Wound Care: Triamcinolone 15 (g) 1 x Per Day/30 Days Discharge Instructions: Use triamcinolone apply to entire both legs. around the wounds. Prim Dressing: Hydrofera Blue Ready Transfer Foam, 2.5x2.5 (in/in) 1 x Per Day/30 Days ary Discharge Instructions: Apply directly to wound bed as directed Prim Dressing: Santyl Ointment 1 x Per Day/30 Days ary Discharge Instructions: Apply nickel thick amount to wound bed as instructed Secondary Dressing: ABD Pad, 8x10 1 x Per Day/30 Days Discharge Instructions: Apply over primary dressing as directed. Secured With: [employer], 4.5x3.1 (in/yd) 1 x Per Day/30 Days Discharge Instructions: Secure with Kerlix as directed. Secured With: 36M Medipore H Soft Cloth Surgical T ape, 4 x 10 (in/yd) 1 x Per Day/30 Days Discharge Instructions: Secure with tape as directed. Electronic Signature(s) Signed: 02/25/2023 4:55:26 PM By: Geralyn Corwin DO Previous Signature: 02/19/2023 4:51:26 PM Version By: Geralyn Corwin DO Previous Signature: 02/19/2023 6:16:33 PM Version By: [name] RN, BSN Entered By: Geralyn Corwin on 02/25/2023 09:33:35 Problem List Details -------------------------------------------------------------------------------- [name] [name] ([id number]) [id number] Page 8 of 13 Patient Name: Date of Service: [name] [name]. 02/19/2023 8:00 A M Medical Record Number: [id number] Patient Account Number: [id number] Date of Birth/Sex: Treating RN: 05/21/1956 (67 y.o. [name] [name] Primary Care Provider: Cranford Mon Other Clinician: Referring Provider: Treating Provider/Extender: Shaune Pollack in Treatment: 8 Active Problems ICD-10 Encounter Code Description Active  Date  MDM Diagnosis I87.313 Chronic venous hypertension (idiopathic) with ulcer of bilateral lower extremity 12/24/2022 [name] Yes L97.818 Non-pressure chronic ulcer of other part of right lower leg with other specified 12/24/2022 [name] Yes severity L97.828 Non-pressure chronic ulcer of other part of left lower leg with other specified 12/24/2022 [name] Yes severity I89.0 Lymphedema, not elsewhere classified 12/24/2022 [name] Yes E11.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 other skin ulcer 12/24/2022 [name] Yes K74.69 Other cirrhosis of liver 12/24/2022 [name] Yes Inactive Problems Resolved Problems Electronic Signature(s) Signed: 02/19/2023 4:51:26 PM By: Geralyn Corwin DO Entered By: Geralyn Corwin on 02/19/2023 09:17:13 -------------------------------------------------------------------------------- Progress Note Details Patient Name: Date of Service: [name] [name], [name] RRIS M. 02/19/2023 8:00 A M Medical Record Number: [id number] Patient Account Number: [id number] Date of Birth/Sex: Treating RN: 14-Jan-1956 (67 y.o. M) Primary Care Provider: Cranford Mon Other Clinician: Referring Provider: Treating Provider/Extender: Shaune Pollack in Treatment: 8 Subjective Chief Complaint Information obtained from Patient 12/24/2022; bilateral lower extremity wounds History of Present Illness (HPI) 12/24/2022 [name] is a 67 year old male with a past medical history of controlled type 2 diabetes on insulin, lymphedema/chronic venous insufficiency and cirrhosis that presents to the clinic for a 1 year history of nonhealing wounds to his lower extremities bilaterally. He reports the starting spontaneously. He states that he has been following with [facility] and they have been using [facility] antibiotic spray to the legs. He is not wearing compression [name] [name], [name] [name] ([id number]) [id number] Page 9 of 13 stockings or wraps. He is also tried Medihoney and collagen in the  past with little benefit. He currently denies systemic signs of infection. 5/30; patient presents for follow-up. He has been using triamcinolone cream to the periwound and Santyl to the wound beds. There has been improvement in wound healing. He denies signs of infection and has [name] issues or complaints today. 6/4; both legs look considerably better. He has a wound on the right lateral lower leg and the left medial lower leg. Significant hemosiderin in the right leg less so on the left. We have been using Hydrofera Blue under compression 6/13; patient presents for follow-up. We have been using antibiotic ointment with Hydrofera Blue under compression therapy. The wounds are smaller. He has [name] issues or complaints today. We discussed ordering juxta lite compression garment wraps T use once his wounds heal as he has hard time putting on o compression stockings. He was agreeable with this. 6/20; patient presents for follow-up. We have been using antibiotic ointment with Hydrofera Blue under compression therapy to the lower extremities bilaterally. Wounds are smaller. He has been approved for PuraPly and was agreeable with placement today. He denies signs of infection. 6/27; patient presents for follow-up. We have been using antibiotic ointment with Hydrofera Blue under compression therapy to the left lower extremity. This wound is healed. He has his juxta lite compression wraps with him today. T the right lateral leg we have been using PuraPly under compression and that wound o is smaller today. Unfortunately he has developed skin breakdown from the Steri-Strip that was used to hold the PuraPly in place and this has created a new wound on the right leg. 7/8; patient presents for follow-up. We have been using PuraPly to the right lateral leg wound Under compression wrap. Wound is slightly smaller however original wound used for PuraPly is healed. He unfortunately developed a new wound to the left lower  extremity but it looks like this was from potential scratching.  He has been using his juxta lite compression here. 716; patient presents for follow-up. We have been using antibiotic ointment with Hydrofera Blue under 3 layer compression to lower extremities bilaterally. Unfortunately he has continued to develop wounds to the left lower extremity. He currently denies signs of infection. Patient History Family History Diabetes - Father, Heart Disease - Father. Social History Current every day smoker, Marital Status - Single, Alcohol Use - Never, Drug Use - [name] History, Caffeine Use - Never. Medical History Hematologic/Lymphatic Patient has history of Anemia, Lymphedema Cardiovascular Patient has history of Hypertension Gastrointestinal Patient has history of Cirrhosis - crpytogenic Endocrine Patient has history of Type II Diabetes - 6.2 HgbA1c Medical A Surgical History Notes nd Gastrointestinal GERD Genitourinary stage III CKD Objective Constitutional respirations regular, non-labored and within target range for patient.. Vitals Time Taken: 8:51 AM, Height: 72 in, Weight: 245 lbs, BMI: 33.2, Temperature: 97.9 F, Pulse: 57 bpm, Respiratory Rate: 18 breaths/min, Blood Pressure: 121/65 mmHg. Cardiovascular 2+ dorsalis pedis/posterior tibialis pulses. Psychiatric pleasant and cooperative. General Notes: Right lower extremity: 2 open wounds with granulation tissue and nonviable tissue. [name] surrounding signs of infection. Venous stasis dermatitis. Left lower extremity: 3 open wounds with granulation tissue and nonviable tissue. Again [name] signs of surrounding infection. Venous stasis dermatitis. Decent edema control. Integumentary (Hair, Skin) Wound #3 status is Open. Original cause of wound was Gradually Appeared. The date acquired was: 01/25/2023. The wound has been in treatment 2 weeks. The wound is located on the Right,Proximal,Lateral Lower Leg. The wound measures 0.6cm length x 0.3cm  width x 0.1cm depth; 0.141cm^2 area and 0.014cm^3 volume. There is Fat Layer (Subcutaneous Tissue) exposed. There is a medium amount of serosanguineous drainage noted. The wound margin is distinct with the outline attached to the wound base. There is [name] granulation within the wound bed. There is a large (67-100%) amount of necrotic tissue within the wound bed including Adherent Slough. The periwound skin appearance did not exhibit: Callus, Crepitus, Excoriation, Induration, Rash, Scarring, Dry/Scaly, Maceration, Atrophie Blanche, Cyanosis, Ecchymosis, Hemosiderin Staining, Mottled, Pallor, Rubor, Erythema. Wound #4 status is Open. Original cause of wound was Gradually Appeared. The date acquired was: 02/11/2023. The wound has been in treatment 1 weeks. The wound is located on the Left,Distal,Medial Lower Leg. The wound measures 0.7cm length x 0.6cm width x 0.1cm depth; 0.33cm^2 area and 0.033cm^3 volume. [name] [name], [name] [name] ([id number]) [id number] Page 10 of 13 There is [name] tunneling or undermining noted. There is a medium amount of serosanguineous drainage noted. The wound margin is distinct with the outline attached to the wound base. There is small (1-33%) pink, pale granulation within the wound bed. There is a large (67-100%) amount of necrotic tissue within the wound bed including Eschar and Adherent Slough. The periwound skin appearance exhibited: Scarring, Hemosiderin Staining. The periwound skin appearance did not exhibit: Callus, Crepitus, Excoriation, Induration, Rash, Dry/Scaly, Maceration, Atrophie Blanche, Cyanosis, Ecchymosis, Mottled, Pallor, Rubor, Erythema. Wound #5 status is Open. Original cause of wound was Blister. The date acquired was: 02/19/2023. The wound is located on the Right,Distal,Lateral Lower Leg. The wound measures 0.9cm length x 0.9cm width x 0.1cm depth; 0.636cm^2 area and 0.064cm^3 volume. There is Fat Layer (Subcutaneous Tissue) exposed. There is  [name] tunneling or undermining noted. There is a medium amount of serosanguineous drainage noted. The wound margin is distinct with the outline attached to the wound base. There is [name] granulation within the wound bed. There is a large (67-100%) amount of necrotic tissue within the wound  bed including Adherent Slough. The periwound skin appearance did not exhibit: Callus, Crepitus, Excoriation, Induration, Rash, Scarring, Dry/Scaly, Maceration, Atrophie Blanche, Cyanosis, Ecchymosis, Hemosiderin Staining, Mottled, Pallor, Rubor, Erythema. Wound #6 status is Open. Original cause of wound was Blister. The date acquired was: 02/19/2023. The wound is located on the Left,Proximal,Posterior Lower Leg. The wound measures 1.4cm length x 1.5cm width x 0.1cm depth; 1.649cm^2 area and 0.165cm^3 volume. There is Fat Layer (Subcutaneous Tissue) exposed. There is [name] tunneling or undermining noted. There is a medium amount of serosanguineous drainage noted. The wound margin is distinct with the outline attached to the wound base. There is [name] granulation within the wound bed. There is a large (67-100%) amount of necrotic tissue within the wound bed including Adherent Slough. The periwound skin appearance did not exhibit: Callus, Crepitus, Excoriation, Induration, Rash, Scarring, Dry/Scaly, Maceration, Atrophie Blanche, Cyanosis, Ecchymosis, Hemosiderin Staining, Mottled, Pallor, Rubor, Erythema. Wound #7 status is Open. Original cause of wound was Blister. The date acquired was: 02/19/2023. The wound is located on the Left,Distal,Posterior Lower Leg. The wound measures 0.8cm length x 1cm width x 0.1cm depth; 0.628cm^2 area and 0.063cm^3 volume. There is Fat Layer (Subcutaneous Tissue) exposed. There is [name] tunneling or undermining noted. There is a medium amount of serosanguineous drainage noted. The wound margin is distinct with the outline attached to the wound base. There is small (1-33%) granulation within the wound bed.  There is a large (67-100%) amount of necrotic tissue within the wound bed including Adherent Slough. The periwound skin appearance did not exhibit: Callus, Crepitus, Excoriation, Induration, Rash, Scarring, Dry/Scaly, Maceration, Atrophie Blanche, Cyanosis, Ecchymosis, Hemosiderin Staining, Mottled, Pallor, Rubor, Erythema. 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 At this time I recommended taking a break from the in office wraps as I suspect this is causing moisture retention and skin breakdown and having him change the dressing daily. Use Santyl and Hydrofera Blue to the wound beds and triamcinolone cream to the periwound. Use juxta lite compression wraps daily. Procedures Wound #3 Pre-procedure diagnosis of Wound #3 is a Diabetic Wound/Ulcer of the Lower Extremity located on the Right,Proximal,Lateral Lower Leg .Severity of Tissue Pre Debridement is: Fat layer exposed. There was a Chemical/Enzymatic/Mechanical debridement performed by [name], RN.Marland Kitchen Agent used was [employer]. There was [name] bleeding. The procedure was tolerated well. Post Debridement Measurements: 0.6cm length x 0.3cm width x 0.1cm depth; 0.014cm^3 volume. Character of Wound/Ulcer Post Debridement requires further debridement. Severity of Tissue Post Debridement is: Fat layer exposed. Post procedure Diagnosis Wound #3: Same as Pre-Procedure Wound #4 Pre-procedure diagnosis of Wound #4 is a Diabetic Wound/Ulcer of the Lower Extremity located on the Left,Distal,Medial Lower Leg  .Severity of Tissue Pre Debridement is: Fat layer exposed. There was a Chemical/Enzymatic/Mechanical debridement performed by [name], RN.Marland Kitchen Agent used was [employer]. There was [name] bleeding. The procedure was tolerated well. Post Debridement Measurements: 0.7cm length x 0.6cm width x 0.1cm depth; 0.033cm^3 volume. Character of Wound/Ulcer Post Debridement requires further debridement. Severity of Tissue Post Debridement is: Fat layer exposed. Post procedure Diagnosis Wound #4: Same as Pre-Procedure Wound #5 Pre-procedure diagnosis of Wound #5 is a Diabetic Wound/Ulcer  of the Lower Extremity located on the Right,Distal,Lateral Lower Leg .Severity of Tissue Pre Debridement is: Fat layer exposed. There was a Chemical/Enzymatic/Mechanical debridement performed by [name], RN.Marland Kitchen Agent used was [employer]. There was [name] bleeding. The procedure was tolerated well. Post Debridement Measurements: 0.9cm length x 0.9cm width x 0.1cm depth; 0.064cm^3 volume. Character of Wound/Ulcer Post Debridement requires further debridement. Severity of Tissue Post Debridement is: Fat layer exposed. Post procedure Diagnosis Wound #5: Same as Pre-Procedure Wound #6 Pre-procedure diagnosis of Wound #6 is a Diabetic Wound/Ulcer of the Lower Extremity located on the Left,Proximal,Posterior Lower Leg .Severity of Tissue Pre Debridement is: Fat layer exposed. There was a Chemical/Enzymatic/Mechanical debridement performed by [name], RN.Marland Kitchen Agent used was [employer]. There was [name] bleeding. The procedure was tolerated well. Post Debridement Measurements: 1.4cm length x 1.5cm width x 0.1cm depth; 0.165cm^3 volume. Character of Wound/Ulcer Post Debridement requires further debridement. Severity of Tissue Post Debridement is: Fat layer exposed. Post procedure Diagnosis Wound #6: Same as Pre-Procedure Wound #7 Pre-procedure diagnosis of Wound #7 is a Diabetic Wound/Ulcer of the Lower Extremity located on the Left,Distal,Posterior  Lower Leg .Severity of Tissue Pre Debridement is: Fat layer exposed. There was a Chemical/Enzymatic/Mechanical debridement performed by [name], RN.Marland Kitchen Agent used was [employer]. There was [name] bleeding. The procedure was tolerated well. Post Debridement Measurements: 0.8cm length x 1cm width x 0.1cm depth; 0.063cm^3 volume. Character of Wound/Ulcer Post Debridement requires further debridement. Severity of Tissue Post Debridement is: Fat layer exposed. [name] [name], [name] [name] ([id number]) [id number] Page 11 of 13 Post procedure Diagnosis Wound #7: Same as Pre-Procedure Plan Follow-up Appointments: Return Appointment in 1 week. - 02/25/2023 845 Dr. Mikey Bussing 0845 Monday Return Appointment in 2 weeks. - Dr. Mikey Bussing and [facility] Room 7 Monday 03/04/2023 0845 Anesthetic: (In clinic) Topical Lidocaine 4% applied to wound bed Bathing/ Shower/ Hygiene: May shower with protection but do not get wound dressing(s) wet. Protect dressing(s) with water repellant cover (for example, large plastic bag) or a cast cover and may then take shower. Edema Control - Lymphedema / SCD / Other: Elevate legs to the level of the heart or above for 30 minutes daily and/or when sitting for 3-4 times a day throughout the day. Avoid standing for long periods of time. Patient to wear own compression stockings every day. Exercise regularly Compression stocking or Garment 30-40 mm/Hg pressure to: - wear juxatlite HDs both legs. WOUND #3: - Lower Leg Wound Laterality: Right, Lateral, Proximal Cleanser: Soap and Water 1 x Per Day/30 Days Discharge Instructions: May shower and wash wound with dial antibacterial soap and water prior to dressing change. Peri-Wound Care: Triamcinolone 15 (g) 1 x Per Day/30 Days Discharge Instructions: Use triamcinolone apply to entire both legs. around the wounds. Prim Dressing: Hydrofera Blue Ready Transfer Foam, 2.5x2.5 (in/in) 1 x Per Day/30 Days ary Discharge Instructions:  Apply directly to wound bed as directed Prim Dressing: Santyl Ointment 1 x Per Day/30 Days ary Discharge Instructions: Apply nickel thick amount to wound bed as instructed Secondary Dressing: ABD Pad, 8x10 1 x Per Day/30 Days Discharge Instructions: Apply over primary dressing as directed. Secured With: [employer], 4.5x3.1 (in/yd) 1 x Per Day/30 Days Discharge Instructions: Secure with Kerlix as directed. Secured With: 42M Medipore H Soft Cloth Surgical T ape, 4 x 10 (in/yd) 1 x Per Day/30 Days Discharge Instructions: Secure with tape as directed. WOUND #4: - Lower Leg Wound Laterality: Left, Medial, Distal Cleanser: Soap and Water 1 x Per Day/30 Days Discharge Instructions: May shower  and wash wound with dial antibacterial soap and water prior to dressing change. Peri-Wound Care: Triamcinolone 15 (g) 1 x Per Day/30 Days Discharge Instructions: Use triamcinolone apply to entire both legs. around the wounds. Prim Dressing: Hydrofera Blue Ready Transfer Foam, 2.5x2.5 (in/in) 1 x Per Day/30 Days ary Discharge Instructions: Apply directly to wound bed as directed Prim Dressing: Santyl Ointment 1 x Per Day/30 Days ary Discharge Instructions: Apply nickel thick amount to wound bed as instructed Secondary Dressing: ABD Pad, 8x10 1 x Per Day/30 Days Discharge Instructions: Apply over primary dressing as directed. Secured With: [employer], 4.5x3.1 (in/yd) 1 x Per Day/30 Days Discharge Instructions: Secure with Kerlix as directed. Secured With: 87M Medipore H Soft Cloth Surgical T ape, 4 x 10 (in/yd) 1 x Per Day/30 Days Discharge Instructions: Secure with tape as directed. WOUND #5: - Lower Leg Wound Laterality: Right, Lateral, Distal Cleanser: Soap and Water 1 x Per Day/30 Days Discharge Instructions: May shower and wash wound with dial antibacterial soap and water prior to dressing change. Peri-Wound Care: Triamcinolone 15 (g) 1 x Per Day/30 Days Discharge Instructions: Use  triamcinolone apply to entire both legs. around the wounds. Prim Dressing: Hydrofera Blue Ready Transfer Foam, 2.5x2.5 (in/in) 1 x Per Day/30 Days ary Discharge Instructions: Apply directly to wound bed as directed Prim Dressing: Santyl Ointment 1 x Per Day/30 Days ary Discharge Instructions: Apply nickel thick amount to wound bed as instructed Secondary Dressing: ABD Pad, 8x10 1 x Per Day/30 Days Discharge Instructions: Apply over primary dressing as directed. Secured With: [employer], 4.5x3.1 (in/yd) 1 x Per Day/30 Days Discharge Instructions: Secure with Kerlix as directed. Secured With: 87M Medipore H Soft Cloth Surgical T ape, 4 x 10 (in/yd) 1 x Per Day/30 Days Discharge Instructions: Secure with tape as directed. WOUND #6: - Lower Leg Wound Laterality: Left, Posterior, Proximal Cleanser: Soap and Water 1 x Per Day/30 Days Discharge Instructions: May shower and wash wound with dial antibacterial soap and water prior to dressing change. Peri-Wound Care: Triamcinolone 15 (g) 1 x Per Day/30 Days Discharge Instructions: Use triamcinolone apply to entire both legs. around the wounds. Prim Dressing: Hydrofera Blue Ready Transfer Foam, 2.5x2.5 (in/in) 1 x Per Day/30 Days ary Discharge Instructions: Apply directly to wound bed as directed Prim Dressing: Santyl Ointment 1 x Per Day/30 Days ary Discharge Instructions: Apply nickel thick amount to wound bed as instructed Secondary Dressing: ABD Pad, 8x10 1 x Per Day/30 Days Discharge Instructions: Apply over primary dressing as directed. Secured With: [employer], 4.5x3.1 (in/yd) 1 x Per Day/30 Days Discharge Instructions: Secure with Kerlix as directed. Secured With: 87M Medipore H Soft Cloth Surgical T ape, 4 x 10 (in/yd) 1 x Per Day/30 Days Discharge Instructions: Secure with tape as directed. WOUND #7: - Lower Leg Wound Laterality: Left, Posterior, Distal Cleanser: Soap and Water 1 x Per Day/30 Days Discharge  Instructions: May shower and wash wound with dial antibacterial soap and water prior to dressing change. Peri-Wound Care: Triamcinolone 15 (g) 1 x Per Day/30 Days Discharge Instructions: Use triamcinolone apply to entire both legs. around the wounds. Prim Dressing: Hydrofera Blue Ready Transfer Foam, 2.5x2.5 (in/in) 1 x Per Day/30 Days ary Discharge Instructions: Apply directly to wound bed as directed Prim Dressing: Santyl Ointment 1 x Per Day/30 Days ary [name] [name], [name] [name] ([id number]) [id number] Page 12 of 13 Discharge Instructions: Apply nickel thick amount to wound bed as instructed Secondary Dressing: ABD Pad, 8x10 1 x Per Day/30 Days Discharge  Instructions: Apply over primary dressing as directed. Secured With: [employer], 4.5x3.1 (in/yd) 1 x Per Day/30 Days Discharge Instructions: Secure with Kerlix as directed. Secured With: 57M Medipore H Soft Cloth Surgical T ape, 4 x 10 (in/yd) 1 x Per Day/30 Days Discharge Instructions: Secure with tape as directed. 1. Santyl and Hydrofera Blue 2. Triamcinolone cream 3. Juxta lite compression wraps daily 4. Follow-up in 1 week Electronic Signature(s) Signed: 02/25/2023 4:55:26 PM By: Geralyn Corwin DO Entered By: Geralyn Corwin on 02/25/2023 09:34:48 -------------------------------------------------------------------------------- HxROS Details Patient Name: Date of Service: [name] [name], [name] RRIS M. 02/19/2023 8:00 A M Medical Record Number: [id number] Patient Account Number: [id number] Date of Birth/Sex: Treating RN: [birth year] (67 y.o. M) Primary Care Provider: Cranford Mon Other Clinician: Referring Provider: Treating Provider/Extender: Shaune Pollack in Treatment: 8 Hematologic/Lymphatic Medical History: Positive for: Anemia; Lymphedema Cardiovascular Medical History: Positive for: Hypertension Gastrointestinal Medical History: Positive for: Cirrhosis - crpytogenic Past  Medical History Notes: GERD Endocrine Medical History: Positive for: Type II Diabetes - 6.2 HgbA1c Time with diabetes: 20 yeatrs Treated with: Insulin Genitourinary Medical History: Past Medical History Notes: stage III CKD Immunizations Pneumococcal Vaccine: Received Pneumococcal Vaccination: [name] Implantable Devices [name] devices added Family and Social History Diabetes: Yes - Father; Heart Disease: Yes - Father; Current every day smoker; Marital Status - Single; Alcohol Use: Never; Drug Use: [name] History; [name] [name], [name] [name] ([id number]) [id number] Page 13 of 13 Caffeine Use: Never; Financial Concerns: [name]; Food, Clothing or Shelter Needs: [name]; Support System Lacking: [name]; Transportation Concerns: [name] Electronic Signature(s) Signed: 02/25/2023 4:55:26 PM By: Geralyn Corwin DO Entered By: Geralyn Corwin on 02/25/2023 09:30:42 -------------------------------------------------------------------------------- SuperBill Details Patient Name: Date of Service: [name] [name], [name] RRIS M. 02/19/2023 Medical Record Number: [id number] Patient Account Number: [id number] Date of Birth/Sex: Treating RN: Dec 21, 1955 (67 y.o. [name] [name] Primary Care Provider: Cranford Mon Other Clinician: Referring Provider: Treating Provider/Extender: Shaune Pollack in Treatment: 8 Diagnosis Coding ICD-10 Codes Code Description [phone] Chronic venous hypertension (idiopathic) with ulcer of bilateral lower extremity L97.818 Non-pressure chronic ulcer of other part of right lower leg with other specified severity L97.828 Non-pressure chronic ulcer of other part of left lower leg with other specified severity I89.0 Lymphedema, not elsewhere classified E11.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 other skin ulcer K74.69 Other cirrhosis of liver Facility Procedures : CPT4 Code: 46962952 Description: [phone] - DEBRIDE W/O ANES NON SELECT Modifier: Quantity: 1 Physician  Procedures : CPT4 Code Description Modifier [id number] - WC PHYS LEVEL 3 - EST PT ICD-10 Diagnosis Description I87.313 Chronic venous hypertension (idiopathic) with ulcer of bilateral lower extremity L97.818 Non-pressure chronic ulcer of other part of right  lower leg with other specified severity L97.828 Non-pressure chronic ulcer of other part of left lower leg with other specified severity E11.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 other skin ulcer Quantity: 1 Electronic Signature(s) Signed: 02/25/2023 4:55:26 PM By: Geralyn Corwin DO Previous Signature: 02/19/2023 4:51:26 PM Version By: Geralyn Corwin DO Previous Signature: 02/19/2023 6:16:33 PM Version By: [name] RN, BSN Entered By: Geralyn Corwin on 02/25/2023 09:35:12

## 2023-02-25 ENCOUNTER — Encounter (HOSPITAL_BASED_OUTPATIENT_CLINIC_OR_DEPARTMENT_OTHER): Payer: Medicare Other | Admitting: Internal Medicine

## 2023-02-25 DIAGNOSIS — I87313 Chronic venous hypertension (idiopathic) with ulcer of bilateral lower extremity: Secondary | ICD-10-CM | POA: Diagnosis not present

## 2023-02-25 DIAGNOSIS — L97818 Non-pressure chronic ulcer of other part of right lower leg with other specified severity: Secondary | ICD-10-CM | POA: Diagnosis not present

## 2023-02-25 DIAGNOSIS — L97828 Non-pressure chronic ulcer of other part of left lower leg with other specified severity: Secondary | ICD-10-CM | POA: Diagnosis not present

## 2023-02-25 DIAGNOSIS — E11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 other skin ulcer: Secondary | ICD-10-CM

## 2023-02-25 NOTE — Progress Notes (Signed)
ASKIA, HAZELIP ([id number]) [id number] Page 1 of 10 Visit Report for 02/25/2023 Chief Complaint Document Details Patient Name: Date of Service: [name] [name]. 02/25/2023 8:45 A M Medical Record Number: [id number] Patient Account Number: [id number] Date of Birth/Sex: Treating RN: June 24, 1956 (67 y.o. M) Primary Care Provider: Cranford Mon Other Clinician: Referring Provider: Treating Provider/Extender: Shaune Pollack in Treatment: 9 Information Obtained from: Patient Chief Complaint 12/24/2022; bilateral lower extremity wounds Electronic Signature(s) Signed: 02/25/2023 4:55:26 PM By: Geralyn Corwin DO Entered By: Geralyn Corwin on 02/25/2023 09:40:11 -------------------------------------------------------------------------------- HPI Details Patient Name: Date of Service: [name] [name], [name] 02/25/2023 8:45 A M Medical Record Number: [id number] Patient Account Number: [id number] Date of Birth/Sex: Treating RN: [birth year] (67 y.o. M) Primary Care Provider: Cranford Mon Other Clinician: Referring Provider: Treating Provider/Extender: Shaune Pollack in Treatment: 9 History of Present Illness HPI Description: 12/24/2022 [name] [name] is a 67 year old male with a past medical history of controlled type 2 diabetes on insulin, lymphedema/chronic venous insufficiency and cirrhosis that presents to the clinic for a 1 year history of nonhealing wounds to his lower extremities bilaterally. He reports the starting spontaneously. He states that he has been following with [facility] and they have been using [facility] antibiotic spray to the legs. He is not wearing compression stockings or wraps. He is also tried Medihoney and collagen in the past with little benefit. He currently denies systemic signs of infection. 5/30; patient presents for follow-up. He has been using triamcinolone cream to the periwound and  Santyl to the wound beds. There has been improvement in wound healing. He denies signs of infection and has no issues or complaints today. 6/4; both legs look considerably better. He has a wound on the right lateral lower leg and the left medial lower leg. Significant hemosiderin in the right leg less so on the left. We have been using Hydrofera Blue under compression 6/13; patient presents for follow-up. We have been using antibiotic ointment with Hydrofera Blue under compression therapy. The wounds are smaller. He has no issues or complaints today. We discussed ordering juxta lite compression garment wraps T use once his wounds heal as he has hard time putting on o compression stockings. He was agreeable with this. 6/20; patient presents for follow-up. We have been using antibiotic ointment with Hydrofera Blue under compression therapy to the lower extremities bilaterally. Wounds are smaller. He has been approved for PuraPly and was agreeable with placement today. He denies signs of infection. 6/27; patient presents for follow-up. We have been using antibiotic ointment with Hydrofera Blue under compression therapy to the left lower extremity. This wound is healed. He has his juxta lite compression wraps with him today. T the right lateral leg we have been using PuraPly under compression and that wound o is smaller today. Unfortunately he has developed skin breakdown from the Steri-Strip that was used to hold the PuraPly in place and this has created a new wound on the right leg. 7/8; patient presents for follow-up. We have been using PuraPly to the right lateral leg wound Under compression wrap. Wound is slightly smaller however original wound used for PuraPly is healed. He unfortunately developed a new wound to the left lower extremity but it looks like this was from potential scratching. He has been using his juxta lite compression here. 716; patient presents for follow-up. We have been using  antibiotic ointment with Hydrofera Blue under 3 layer compression  to lower extremities bilaterally. Unfortunately he has continued to develop wounds to the left lower extremity. He currently denies signs of infection. AASHISH, HAMM ([id number]) [id number] Page 2 of 10 7/22; patient presents for follow-up. He has been using Santyl and Hydrofera Blue with triamcinolone cream under her juxta lite compression wraps daily. No new wounds today. Current wounds are stable. Less irritation to the periwound. Electronic Signature(s) Signed: 02/25/2023 4:55:26 PM By: Geralyn Corwin DO Entered By: Geralyn Corwin on 02/25/2023 09:40:52 -------------------------------------------------------------------------------- Physical Exam Details Patient Name: Date of Service: [name] 88, [name] 02/25/2023 8:45 A M Medical Record Number: [id number] Patient Account Number: [id number] Date of Birth/Sex: Treating RN: 1955/10/30 (67 y.o. M) Primary Care Provider: Cranford Mon Other Clinician: Referring Provider: Treating Provider/Extender: Shaune Pollack in Treatment: 9 Constitutional respirations regular, non-labored and within target range for patient.. Cardiovascular 2+ dorsalis pedis/posterior tibialis pulses. Psychiatric pleasant and cooperative. Notes Right lower extremity: 2 open wounds with granulation tissue and nonviable tissue. No surrounding signs of infection. Venous stasis dermatitis. Left lower extremity: 3 open wounds with granulation tissue and nonviable tissue. Again no signs of surrounding infection. Venous stasis dermatitis. Decent edema control. Electronic Signature(s) Signed: 02/25/2023 4:55:26 PM By: Geralyn Corwin DO Entered By: Geralyn Corwin on 02/25/2023 09:41:19 -------------------------------------------------------------------------------- Physician Orders Details Patient Name: Date of Service: [name] [name], [name] 02/25/2023  8:45 A M Medical Record Number: [id number] Patient Account Number: [id number] Date of Birth/Sex: Treating RN: Oct 08, 1955 (67 y.o. [name] Primary Care Provider: Cranford Mon Other Clinician: Referring Provider: Treating Provider/Extender: Shaune Pollack in Treatment: 9 Verbal / Phone Orders: No Diagnosis Coding Follow-up Appointments ppointment in 1 week. - Dr. Mikey Bussing Return A ppointment in 2 weeks. - Dr. Mikey Bussing and [place] Return A Room 7 Monday 03/04/2023 0845 Anesthetic (In clinic) Topical Lidocaine 4% applied to wound bed Bathing/ Shower/ Hygiene May shower with protection but do not get wound dressing(s) wet. Protect dressing(s) with water repellant cover (for example, large plastic bag) or a cast cover and may then take shower. Edema Control - Lymphedema / SCD / Other JHAMIR, PICKUP ([id number]) [id number] Page 3 of 10 Elevate legs to the level of the heart or above for 30 minutes daily and/or when sitting for 3-4 times a day throughout the day. Avoid standing for long periods of time. Patient to wear own compression stockings every day. Exercise regularly Compression stocking or Garment 30-40 mm/Hg pressure to: - wear juxatlite HDs both legs. Wound Treatment Wound #3 - Lower Leg Wound Laterality: Right, Lateral, Proximal Cleanser: Soap and Water 1 x Per Day/30 Days Discharge Instructions: May shower and wash wound with dial antibacterial soap and water prior to dressing change. Peri-Wound Care: Triamcinolone 15 (g) 1 x Per Day/30 Days Discharge Instructions: Use triamcinolone apply to entire both legs. around the wounds. Prim Dressing: Hydrofera Blue Ready Transfer Foam, 2.5x2.5 (in/in) 1 x Per Day/30 Days ary Discharge Instructions: Apply directly to wound bed as directed Prim Dressing: Santyl Ointment 1 x Per Day/30 Days ary Discharge Instructions: Apply nickel thick amount to wound bed as instructed Secondary  Dressing: ABD Pad, 8x10 1 x Per Day/30 Days Discharge Instructions: Apply over primary dressing as directed. Secured With: [employer], 4.5x3.1 (in/yd) 1 x Per Day/30 Days Discharge Instructions: Secure with Kerlix as directed. Secured With: 51M Medipore H Soft Cloth Surgical T ape, 4 x 10 (in/yd) 1 x Per Day/30 Days Discharge Instructions: Secure with tape as  directed. Wound #4 - Lower Leg Wound Laterality: Left, Medial, Distal Cleanser: Soap and Water 1 x Per Day/30 Days Discharge Instructions: May shower and wash wound with dial antibacterial soap and water prior to dressing change. Peri-Wound Care: Triamcinolone 15 (g) 1 x Per Day/30 Days Discharge Instructions: Use triamcinolone apply to entire both legs. around the wounds. Prim Dressing: Hydrofera Blue Ready Transfer Foam, 2.5x2.5 (in/in) 1 x Per Day/30 Days ary Discharge Instructions: Apply directly to wound bed as directed Prim Dressing: Santyl Ointment 1 x Per Day/30 Days ary Discharge Instructions: Apply nickel thick amount to wound bed as instructed Secondary Dressing: ABD Pad, 8x10 1 x Per Day/30 Days Discharge Instructions: Apply over primary dressing as directed. Secured With: [employer], 4.5x3.1 (in/yd) 1 x Per Day/30 Days Discharge Instructions: Secure with Kerlix as directed. Secured With: 37M Medipore H Soft Cloth Surgical T ape, 4 x 10 (in/yd) 1 x Per Day/30 Days Discharge Instructions: Secure with tape as directed. Wound #5 - Lower Leg Wound Laterality: Right, Lateral, Distal Cleanser: Soap and Water 1 x Per Day/30 Days Discharge Instructions: May shower and wash wound with dial antibacterial soap and water prior to dressing change. Peri-Wound Care: Triamcinolone 15 (g) 1 x Per Day/30 Days Discharge Instructions: Use triamcinolone apply to entire both legs. around the wounds. Prim Dressing: Hydrofera Blue Ready Transfer Foam, 2.5x2.5 (in/in) 1 x Per Day/30 Days ary Discharge Instructions: Apply  directly to wound bed as directed Prim Dressing: Santyl Ointment 1 x Per Day/30 Days ary Discharge Instructions: Apply nickel thick amount to wound bed as instructed Secondary Dressing: ABD Pad, 8x10 1 x Per Day/30 Days Discharge Instructions: Apply over primary dressing as directed. Secured With: [employer], 4.5x3.1 (in/yd) 1 x Per Day/30 Days Discharge Instructions: Secure with Kerlix as directed. Secured With: 37M Medipore H Soft Cloth Surgical T ape, 4 x 10 (in/yd) 1 x Per Day/30 Days Discharge Instructions: Secure with tape as directed. Wound #6 - Lower Leg Wound Laterality: Left, Posterior, Proximal Cleanser: Soap and Water 1 x Per Day/30 Days Discharge Instructions: May shower and wash wound with dial antibacterial soap and water prior to dressing change. ALFRED, HARREL ([id number]) [id number] Page 4 of 10 Peri-Wound Care: Triamcinolone 15 (g) 1 x Per Day/30 Days Discharge Instructions: Use triamcinolone apply to entire both legs. around the wounds. Prim Dressing: Hydrofera Blue Ready Transfer Foam, 2.5x2.5 (in/in) 1 x Per Day/30 Days ary Discharge Instructions: Apply directly to wound bed as directed Prim Dressing: Santyl Ointment 1 x Per Day/30 Days ary Discharge Instructions: Apply nickel thick amount to wound bed as instructed Secondary Dressing: ABD Pad, 8x10 1 x Per Day/30 Days Discharge Instructions: Apply over primary dressing as directed. Secured With: [employer], 4.5x3.1 (in/yd) 1 x Per Day/30 Days Discharge Instructions: Secure with Kerlix as directed. Secured With: 37M Medipore H Soft Cloth Surgical T ape, 4 x 10 (in/yd) 1 x Per Day/30 Days Discharge Instructions: Secure with tape as directed. Wound #7 - Lower Leg Wound Laterality: Left, Posterior, Distal Cleanser: Soap and Water 1 x Per Day/30 Days Discharge Instructions: May shower and wash wound with dial antibacterial soap and water prior to dressing  change. Peri-Wound Care: Triamcinolone 15 (g) 1 x Per Day/30 Days Discharge Instructions: Use triamcinolone apply to entire both legs. around the wounds. Prim Dressing: Hydrofera Blue Ready Transfer Foam, 2.5x2.5 (in/in) 1 x Per Day/30 Days ary Discharge Instructions: Apply directly to wound bed as directed Prim Dressing: Santyl Ointment 1 x Per Day/30  Days ary Discharge Instructions: Apply nickel thick amount to wound bed as instructed Secondary Dressing: ABD Pad, 8x10 1 x Per Day/30 Days Discharge Instructions: Apply over primary dressing as directed. Secured With: [employer], 4.5x3.1 (in/yd) 1 x Per Day/30 Days Discharge Instructions: Secure with Kerlix as directed. Secured With: 56M Medipore H Soft Cloth Surgical T ape, 4 x 10 (in/yd) 1 x Per Day/30 Days Discharge Instructions: Secure with tape as directed. Consults Dermatology - Refer to dermatology Electronic Signature(s) Signed: 02/25/2023 4:55:26 PM By: Geralyn Corwin DO Entered By: Geralyn Corwin on 02/25/2023 09:41:31 Prescription 02/25/2023 -------------------------------------------------------------------------------- Cay Schillings. Geralyn Corwin DO Patient Name: Provider: [birth year] [id number] Date of Birth: NPI#: Judie Petit [id number] Sex: DEA #: [phone] Phone #: License #: [employer] Signs: Patient Address: [address] Eligha Bridegroom [facility] [place], [place] [address] [address] [place], [place] [address] [phone] Allergies No Known Drug Allergies MEHRAN, GUDERIAN ([id number]) [id number] Page 5 of 10 Provider's Orders Dermatology - Refer to dermatology Hand Signature: Date(s): Electronic Signature(s) Signed: 02/25/2023 4:55:26 PM By: Geralyn Corwin DO Entered By: Geralyn Corwin on 02/25/2023 09:41:32 -------------------------------------------------------------------------------- Problem List Details Patient Name: Date of  Service: [name] [name] RRIS M. 02/25/2023 8:45 A M Medical Record Number: [id number] Patient Account Number: [id number] Date of Birth/Sex: Treating RN: [birth year] (67 y.o. M) Primary Care Provider: Cranford Mon Other Clinician: Referring Provider: Treating Provider/Extender: Shaune Pollack in Treatment: 9 Active Problems ICD-10 Encounter Code Description Active Date MDM Diagnosis I87.313 Chronic venous hypertension (idiopathic) with ulcer of bilateral lower extremity 12/24/2022 No Yes L97.818 Non-pressure chronic ulcer of other part of right lower leg with other specified 12/24/2022 No Yes severity L97.828 Non-pressure chronic ulcer of other part of left lower leg with other specified 12/24/2022 No Yes severity I89.0 Lymphedema, not elsewhere classified 12/24/2022 No Yes E11.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 other skin ulcer 12/24/2022 No Yes K74.69 Other cirrhosis of liver 12/24/2022 No Yes Inactive Problems Resolved Problems Electronic Signature(s) Signed: 02/25/2023 4:55:26 PM By: Geralyn Corwin DO Entered By: Geralyn Corwin on 02/25/2023 09:39:57 Cay Schillings ([id number]) [id number] Page 6 of 10 -------------------------------------------------------------------------------- Progress Note Details Patient Name: Date of Service: [name] [name]. 02/25/2023 8:45 A M Medical Record Number: [id number] Patient Account Number: [id number] Date of Birth/Sex: Treating RN: Oct 26, 1955 (67 y.o. M) Primary Care Provider: Cranford Mon Other Clinician: Referring Provider: Treating Provider/Extender: Shaune Pollack in Treatment: 9 Subjective Chief Complaint Information obtained from Patient 12/24/2022; bilateral lower extremity wounds History of Present Illness (HPI) 12/24/2022 [name] is a 67 year old male with a past medical history of controlled type 2 diabetes on insulin, lymphedema/chronic venous  insufficiency and cirrhosis that presents to the clinic for a 1 year history of nonhealing wounds to his lower extremities bilaterally. He reports the starting spontaneously. He states that he has been following with [facility] and they have been using [facility] antibiotic spray to the legs. He is not wearing compression stockings or wraps. He is also tried Medihoney and collagen in the past with little benefit. He currently denies systemic signs of infection. 5/30; patient presents for follow-up. He has been using triamcinolone cream to the periwound and Santyl to the wound beds. There has been improvement in wound healing. He denies signs of infection and has no issues or complaints today. 6/4; both legs look considerably better. He has a wound on the right lateral lower leg  and the left medial lower leg. Significant hemosiderin in the right leg less so on the left. We have been using Hydrofera Blue under compression 6/13; patient presents for follow-up. We have been using antibiotic ointment with Hydrofera Blue under compression therapy. The wounds are smaller. He has no issues or complaints today. We discussed ordering juxta lite compression garment wraps T use once his wounds heal as he has hard time putting on o compression stockings. He was agreeable with this. 6/20; patient presents for follow-up. We have been using antibiotic ointment with Hydrofera Blue under compression therapy to the lower extremities bilaterally. Wounds are smaller. He has been approved for PuraPly and was agreeable with placement today. He denies signs of infection. 6/27; patient presents for follow-up. We have been using antibiotic ointment with Hydrofera Blue under compression therapy to the left lower extremity. This wound is healed. He has his juxta lite compression wraps with him today. T the right lateral leg we have been using PuraPly under compression and that wound o is smaller today. Unfortunately  he has developed skin breakdown from the Steri-Strip that was used to hold the PuraPly in place and this has created a new wound on the right leg. 7/8; patient presents for follow-up. We have been using PuraPly to the right lateral leg wound Under compression wrap. Wound is slightly smaller however original wound used for PuraPly is healed. He unfortunately developed a new wound to the left lower extremity but it looks like this was from potential scratching. He has been using his juxta lite compression here. 716; patient presents for follow-up. We have been using antibiotic ointment with Hydrofera Blue under 3 layer compression to lower extremities bilaterally. Unfortunately he has continued to develop wounds to the left lower extremity. He currently denies signs of infection. 7/22; patient presents for follow-up. He has been using Santyl and Hydrofera Blue with triamcinolone cream under her juxta lite compression wraps daily. No new wounds today. Current wounds are stable. Less irritation to the periwound. Patient History Family History Diabetes - Father, Heart Disease - Father. Social History Current every day smoker, Marital Status - Single, Alcohol Use - Never, Drug Use - No History, Caffeine Use - Never. Medical History Hematologic/Lymphatic Patient has history of Anemia, Lymphedema Cardiovascular Patient has history of Hypertension Gastrointestinal Patient has history of Cirrhosis - crpytogenic Endocrine Patient has history of Type II Diabetes - 6.2 HgbA1c Medical A Surgical History Notes nd Gastrointestinal GERD Genitourinary stage III CKD VIKRANT, PRYCE ([id number]) [id number] Page 7 of 10 Objective Constitutional respirations regular, non-labored and within target range for patient.. Vitals Time Taken: 8:10 AM, Height: 72 in, Weight: 245 lbs, BMI: 33.2, Temperature: 98 F, Pulse: 53 bpm, Respiratory Rate: 18 breaths/min, Blood Pressure: 161/69  mmHg. Cardiovascular 2+ dorsalis pedis/posterior tibialis pulses. Psychiatric pleasant and cooperative. General Notes: Right lower extremity: 2 open wounds with granulation tissue and nonviable tissue. No surrounding signs of infection. Venous stasis dermatitis. Left lower extremity: 3 open wounds with granulation tissue and nonviable tissue. Again no signs of surrounding infection. Venous stasis dermatitis. Decent edema control. Integumentary (Hair, Skin) Wound #3 status is Open. Original cause of wound was Gradually Appeared. The date acquired was: 01/25/2023. The wound has been in treatment 3 weeks. The wound is located on the Right,Proximal,Lateral Lower Leg. The wound measures 0.6cm length x 0.5cm width x 0.1cm depth; 0.236cm^2 area and 0.024cm^3 volume. There is Fat Layer (Subcutaneous Tissue) exposed. There is no tunneling or undermining noted. There is a medium  amount of serosanguineous drainage noted. The wound margin is distinct with the outline attached to the wound base. There is no granulation within the wound bed. There is a large (67- 100%) amount of necrotic tissue within the wound bed including Adherent Slough. The periwound skin appearance did not exhibit: Callus, Crepitus, Excoriation, Induration, Rash, Scarring, Dry/Scaly, Maceration, Atrophie Blanche, Cyanosis, Ecchymosis, Hemosiderin Staining, Mottled, Pallor, Rubor, Erythema. Wound #4 status is Open. Original cause of wound was Gradually Appeared. The date acquired was: 02/11/2023. The wound has been in treatment 2 weeks. The wound is located on the Left,Distal,Medial Lower Leg. The wound measures 0.3cm length x 0.4cm width x 0.1cm depth; 0.094cm^2 area and 0.009cm^3 volume. There is a medium amount of serosanguineous drainage noted. The wound margin is distinct with the outline attached to the wound base. There is small (1-33%) pink, pale granulation within the wound bed. There is a large (67-100%) amount of necrotic tissue  within the wound bed including Eschar and Adherent Slough. The periwound skin appearance exhibited: Scarring, Hemosiderin Staining. The periwound skin appearance did not exhibit: Callus, Crepitus, Excoriation, Induration, Rash, Dry/Scaly, Maceration, Atrophie Blanche, Cyanosis, Ecchymosis, Mottled, Pallor, Rubor, Erythema. Wound #5 status is Open. Original cause of wound was Blister. The date acquired was: 02/19/2023. The wound is located on the Right,Distal,Lateral Lower Leg. The wound measures 0.9cm length x 1.2cm width x 0.1cm depth; 0.848cm^2 area and 0.085cm^3 volume. There is Fat Layer (Subcutaneous Tissue) exposed. There is no tunneling or undermining noted. There is a medium amount of serosanguineous drainage noted. The wound margin is distinct with the outline attached to the wound base. There is no granulation within the wound bed. There is a large (67-100%) amount of necrotic tissue within the wound bed including Adherent Slough. The periwound skin appearance did not exhibit: Callus, Crepitus, Excoriation, Induration, Rash, Scarring, Dry/Scaly, Maceration, Atrophie Blanche, Cyanosis, Ecchymosis, Hemosiderin Staining, Mottled, Pallor, Rubor, Erythema. Wound #6 status is Open. Original cause of wound was Blister. The date acquired was: 02/19/2023. The wound is located on the Left,Proximal,Posterior Lower Leg. The wound measures 1.2cm length x 2cm width x 0.1cm depth; 1.885cm^2 area and 0.188cm^3 volume. There is Fat Layer (Subcutaneous Tissue) exposed. There is no tunneling or undermining noted. There is a medium amount of serosanguineous drainage noted. The wound margin is distinct with the outline attached to the wound base. There is no granulation within the wound bed. There is a large (67-100%) amount of necrotic tissue within the wound bed including Adherent Slough. The periwound skin appearance did not exhibit: Callus, Crepitus, Excoriation, Induration, Rash, Scarring, Dry/Scaly,  Maceration, Atrophie Blanche, Cyanosis, Ecchymosis, Hemosiderin Staining, Mottled, Pallor, Rubor, Erythema. Wound #7 status is Open. Original cause of wound was Blister. The date acquired was: 02/19/2023. The wound is located on the Left,Distal,Posterior Lower Leg. The wound measures 0.7cm length x 0.7cm width x 0.1cm depth; 0.385cm^2 area and 0.038cm^3 volume. There is Fat Layer (Subcutaneous Tissue) exposed. There is no tunneling or undermining noted. There is a medium amount of serosanguineous drainage noted. The wound margin is distinct with the outline attached to the wound base. There is small (1-33%) granulation within the wound bed. There is a large (67-100%) amount of necrotic tissue within the wound bed including Adherent Slough. The periwound skin appearance did not exhibit: Callus, Crepitus, Excoriation, Induration, Rash, Scarring, Dry/Scaly, Maceration, Atrophie Blanche, Cyanosis, Ecchymosis, Hemosiderin Staining, Mottled, Pallor, Rubor, Erythema. 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 There is still nonviable tissue in the wounds I recommended continuing Santyl and Hydrofera Blue daily. Continue with triamcinolone cream to the periwound and juxta lite compression wraps daily. At this time I recommended a referral to dermatology as his wounds had almost completely healed, with only one remaining to the right leg and then returned spontaneously to both legs. May need workup for autoimmune causes. Plan Follow-up Appointments: Return Appointment in 1 week. -  Dr. Samella Parr, [place] ([id number]) [id number] Page 8 of 10 Return Appointment in 2 weeks. - Dr. Mikey Bussing and [place] [facility] Monday 03/04/2023 0845 Anesthetic: (In clinic) Topical Lidocaine 4% applied to wound bed Bathing/ Shower/ Hygiene: May shower with protection but do not get wound dressing(s) wet. Protect dressing(s) with water repellant cover (for example, large plastic bag) or a cast cover and may then take shower. Edema Control - Lymphedema / SCD / Other: Elevate legs to the level of the heart or above for 30 minutes daily and/or when sitting for 3-4 times a day throughout the day. Avoid standing for long periods of time. Patient to wear own compression stockings every day. Exercise regularly Compression stocking or Garment 30-40 mm/Hg pressure to: - wear juxatlite HDs both legs. Consults ordered were: Dermatology - Refer to dermatology WOUND #3: - Lower Leg Wound Laterality: Right, Lateral, Proximal Cleanser: Soap and Water 1 x Per Day/30 Days Discharge Instructions: May shower and wash wound with dial antibacterial soap and water prior to dressing change. Peri-Wound Care: Triamcinolone 15 (g) 1 x Per Day/30 Days Discharge Instructions: Use triamcinolone apply to entire both legs. around the wounds. Prim Dressing: Hydrofera Blue Ready Transfer Foam, 2.5x2.5 (in/in) 1 x Per Day/30 Days ary Discharge Instructions: Apply directly to wound bed as directed Prim Dressing: Santyl Ointment 1 x Per Day/30 Days ary Discharge Instructions: Apply nickel thick amount to wound bed as instructed Secondary Dressing: ABD Pad, 8x10 1 x Per Day/30 Days Discharge Instructions: Apply over primary dressing as directed. Secured With: [employer], 4.5x3.1 (in/yd) 1 x Per Day/30 Days Discharge Instructions: Secure with Kerlix as directed. Secured With: 82M Medipore H Soft Cloth Surgical T ape, 4 x 10 (in/yd) 1 x Per Day/30 Days Discharge Instructions: Secure with  tape as directed. WOUND #4: - Lower Leg Wound Laterality: Left, Medial, Distal Cleanser: Soap and Water 1 x Per Day/30 Days Discharge Instructions: May shower and wash wound with dial antibacterial soap and water prior to dressing change. Peri-Wound Care: Triamcinolone 15 (g) 1 x Per Day/30 Days Discharge Instructions: Use triamcinolone apply to entire both legs. around the wounds. Prim Dressing: Hydrofera Blue Ready Transfer Foam, 2.5x2.5 (in/in) 1 x Per Day/30 Days ary Discharge Instructions: Apply directly to wound bed as directed Prim Dressing: Santyl Ointment 1 x Per Day/30 Days ary Discharge Instructions: Apply nickel thick amount to wound bed as instructed Secondary Dressing: ABD Pad, 8x10 1 x Per Day/30 Days Discharge Instructions: Apply over primary dressing as directed. Secured With: [employer], 4.5x3.1 (in/yd) 1 x Per Day/30 Days Discharge Instructions: Secure with Kerlix as directed. Secured With: 82M Medipore H Soft Cloth Surgical T ape, 4 x 10 (in/yd) 1 x Per Day/30 Days Discharge Instructions: Secure with tape as directed. WOUND #5: -  Lower Leg Wound Laterality: Right, Lateral, Distal Cleanser: Soap and Water 1 x Per Day/30 Days Discharge Instructions: May shower and wash wound with dial antibacterial soap and water prior to dressing change. Peri-Wound Care: Triamcinolone 15 (g) 1 x Per Day/30 Days Discharge Instructions: Use triamcinolone apply to entire both legs. around the wounds. Prim Dressing: Hydrofera Blue Ready Transfer Foam, 2.5x2.5 (in/in) 1 x Per Day/30 Days ary Discharge Instructions: Apply directly to wound bed as directed Prim Dressing: Santyl Ointment 1 x Per Day/30 Days ary Discharge Instructions: Apply nickel thick amount to wound bed as instructed Secondary Dressing: ABD Pad, 8x10 1 x Per Day/30 Days Discharge Instructions: Apply over primary dressing as directed. Secured With: [employer], 4.5x3.1 (in/yd) 1 x Per Day/30 Days Discharge  Instructions: Secure with Kerlix as directed. Secured With: 50M Medipore H Soft Cloth Surgical T ape, 4 x 10 (in/yd) 1 x Per Day/30 Days Discharge Instructions: Secure with tape as directed. WOUND #6: - Lower Leg Wound Laterality: Left, Posterior, Proximal Cleanser: Soap and Water 1 x Per Day/30 Days Discharge Instructions: May shower and wash wound with dial antibacterial soap and water prior to dressing change. Peri-Wound Care: Triamcinolone 15 (g) 1 x Per Day/30 Days Discharge Instructions: Use triamcinolone apply to entire both legs. around the wounds. Prim Dressing: Hydrofera Blue Ready Transfer Foam, 2.5x2.5 (in/in) 1 x Per Day/30 Days ary Discharge Instructions: Apply directly to wound bed as directed Prim Dressing: Santyl Ointment 1 x Per Day/30 Days ary Discharge Instructions: Apply nickel thick amount to wound bed as instructed Secondary Dressing: ABD Pad, 8x10 1 x Per Day/30 Days Discharge Instructions: Apply over primary dressing as directed. Secured With: [employer], 4.5x3.1 (in/yd) 1 x Per Day/30 Days Discharge Instructions: Secure with Kerlix as directed. Secured With: 50M Medipore H Soft Cloth Surgical T ape, 4 x 10 (in/yd) 1 x Per Day/30 Days Discharge Instructions: Secure with tape as directed. WOUND #7: - Lower Leg Wound Laterality: Left, Posterior, Distal Cleanser: Soap and Water 1 x Per Day/30 Days Discharge Instructions: May shower and wash wound with dial antibacterial soap and water prior to dressing change. Peri-Wound Care: Triamcinolone 15 (g) 1 x Per Day/30 Days Discharge Instructions: Use triamcinolone apply to entire both legs. around the wounds. Prim Dressing: Hydrofera Blue Ready Transfer Foam, 2.5x2.5 (in/in) 1 x Per Day/30 Days ary Discharge Instructions: Apply directly to wound bed as directed Prim Dressing: Santyl Ointment 1 x Per Day/30 Days ary Discharge Instructions: Apply nickel thick amount to wound bed as instructed Secondary Dressing:  ABD Pad, 8x10 1 x Per Day/30 Days Discharge Instructions: Apply over primary dressing as directed. Secured With: [employer], 4.5x3.1 (in/yd) 1 x Per Day/30 Days Discharge Instructions: Secure with Kerlix as directed. Secured With: 50M Medipore H Soft Cloth Surgical T ape, 4 x 10 (in/yd) 1 x Per Day/30 Days Discharge Instructions: Secure with tape as directed. ZAIDYN, CLAIRE ([id number]) [id number] Page 9 of 10 1. Santyl and Hydrofera Blue 2. Triamcinolone cream 3. Juxta light Velcro compression daily 4. Referral to dermatology Electronic Signature(s) Signed: 02/25/2023 4:55:26 PM By: Geralyn Corwin DO Entered By: Geralyn Corwin on 02/25/2023 09:49:05 -------------------------------------------------------------------------------- HxROS Details Patient Name: Date of Service: [name] [name], [name] 02/25/2023 8:45 A M Medical Record Number: [id number] Patient Account Number: [id number] Date of Birth/Sex: Treating RN: 18-Apr-1956 (67 y.o. M) Primary Care Provider: Cranford Mon Other Clinician: Referring Provider: Treating Provider/Extender: Shaune Pollack in Treatment: 9 Hematologic/Lymphatic Medical History: Positive  for: Anemia; Lymphedema Cardiovascular Medical History: Positive for: Hypertension Gastrointestinal Medical History: Positive for: Cirrhosis - crpytogenic Past Medical History Notes: GERD Endocrine Medical History: Positive for: Type II Diabetes - 6.2 HgbA1c Time with diabetes: 20 yeatrs Treated with: Insulin Genitourinary Medical History: Past Medical History Notes: stage III CKD Immunizations Pneumococcal Vaccine: Received Pneumococcal Vaccination: No Implantable Devices No devices added Family and Social History Diabetes: Yes - Father; Heart Disease: Yes - Father; Current every day smoker; Marital Status - Single; Alcohol Use: Never; Drug Use: No History; Caffeine Use: Never; Financial Concerns:  No; Food, Clothing or Shelter Needs: No; Support System Lacking: No; Transportation Concerns: No Electronic Signature(s) Signed: 02/25/2023 4:55:26 PM By: Geralyn Corwin DO Entered By: Geralyn Corwin on 02/25/2023 09:40:57 Cay Schillings ([id number]) [id number] Page 10 of 10 -------------------------------------------------------------------------------- SuperBill Details Patient Name: Date of Service: [name] [name] 02/25/2023 Medical Record Number: [id number] Patient Account Number: [id number] Date of Birth/Sex: Treating RN: 09-Sep-1955 (67 y.o. [name] Primary Care Provider: Cranford Mon Other Clinician: Referring Provider: Treating Provider/Extender: Shaune Pollack in Treatment: 9 Diagnosis Coding ICD-10 Codes Code Description [phone] Chronic venous hypertension (idiopathic) with ulcer of bilateral lower extremity L97.818 Non-pressure chronic ulcer of other part of right lower leg with other specified severity L97.828 Non-pressure chronic ulcer of other part of left lower leg with other specified severity I89.0 Lymphedema, not elsewhere classified E11.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 other skin ulcer K74.69 Other cirrhosis of liver Facility Procedures : CPT4 Code: [id number] Description: 84696 - WOUND CARE VISIT-LEV 5 EST PT Modifier: Quantity: 1 Physician Procedures : CPT4 Code Description Modifier [id number] - WC PHYS LEVEL 3 - EST PT ICD-10 Diagnosis Description I87.313 Chronic venous hypertension (idiopathic) with ulcer of bilateral lower extremity L97.818 Non-pressure chronic ulcer of other part of right  lower leg with other specified severity L97.828 Non-pressure chronic ulcer of other part of left lower leg with other specified severity E11.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 other skin ulcer Quantity: 1 Electronic Signature(s) Signed: 02/25/2023 4:55:26 PM By: Geralyn Corwin DO Entered By: Geralyn Corwin on 02/25/2023 09:49:20

## 2023-02-27 NOTE — Progress Notes (Signed)
OTTIE, TILLERY ([id number]) [id number] Page 1 of 12 Visit Report for 02/19/2023 Arrival Information Details Patient Name: Date of Service: [name] RRIS M. 02/19/2023 8:00 A M Medical Record Number: [id number] Patient Account Number: [id number] Date of Birth/Sex: Treating RN: [birth year] (67 y.o. M) Primary Care Fotini Lemus: [place] Other Clinician: Referring Ruby Logiudice: Treating Adelfa Lozito/Extender: Shaune Pollack in Treatment: 8 Visit Information History Since Last Visit Added or deleted any medications: No Patient Arrived: Ambulatory Any new allergies or adverse reactions: No Arrival Time: 08:51 Had a fall or experienced change in No Accompanied By: wife activities of daily living that may affect Transfer Assistance: None risk of falls: Patient Identification Verified: Yes Signs or symptoms of abuse/neglect since last visito No Secondary Verification Process Completed: Yes Hospitalized since last visit: No Patient Requires Transmission-Based No Implantable device outside of the clinic excluding No Precautions: cellular tissue based products placed in the center Patient Has Alerts: Yes since last visit: Patient Alerts: 10/23 ABI L0.94 R1 VVS Has Dressing in Place as Prescribed: Yes 10/23 TBI L1.2 R0.88 VVS Pain Present Now: No Electronic Signature(s) Signed: 02/27/2023 2:28:10 PM By: Karl Ito Entered By: Karl Ito on 02/19/2023 08:51:56 -------------------------------------------------------------------------------- Encounter Discharge Information Details Patient Name: Date of Service: [name], [name] 02/19/2023 8:00 A M Medical Record Number: [id number] Patient Account Number: [id number] Date of Birth/Sex: Treating RN: Nov 14, 1955 (67 y.o. [name] Primary Care Satvik Parco: [place] [facility] Other Clinician: Referring Fraida Veldman: Treating Jerrik Housholder/Extender: Shaune Pollack in Treatment:  8 Encounter Discharge Information Items Post Procedure Vitals Discharge Condition: Stable Temperature (F): 97.6 Ambulatory Status: Ambulatory Pulse (bpm): 80 Discharge Destination: Home Respiratory Rate (breaths/min): 18 Transportation: Private Auto Blood Pressure (mmHg): 146/74 Accompanied By: spouse Schedule Follow-up Appointment: Yes Clinical Summary of Care: Patient Declined Electronic Signature(s) Signed: 02/20/2023 7:59:22 AM By: Brenton Grills Entered By: Brenton Grills on 02/19/2023 09:51:09 Cay Schillings ([id number]) [id number] Page 2 of 12 -------------------------------------------------------------------------------- Lower Extremity Assessment Details Patient Name: Date of Service: [name] RRIS M. 02/19/2023 8:00 A M Medical Record Number: [id number] Patient Account Number: [id number] Date of Birth/Sex: Treating RN: 1956/01/06 (67 y.o. [name], [name] Primary Care Verley Pariseau: [place] Other Clinician: Referring Jeimy Bickert: Treating Arleatha Philipps/Extender: Shaune Pollack in Treatment: 8 Edema Assessment Assessed: [name]: Yes] [name]: Yes] Edema: [Left: No] [Right: No] Calf Left: Right: Point of Measurement: 37 cm From Medial Instep 33 cm 32.6 cm Ankle Left: Right: Point of Measurement: 13 cm From Medial Instep 22.5 cm 23 cm Vascular Assessment Pulses: Dorsalis Pedis Palpable: [Left:Yes] [Right:Yes] Extremity colors, hair growth, and conditions: Extremity Color: [Left:Hyperpigmented] [Right:Hyperpigmented] Hair Growth on Extremity: [Left:No] [Right:No] Temperature of Extremity: [Left:Warm] [Right:Warm] Capillary Refill: [Left:< 3 seconds] [Right:< 3 seconds] Dependent Rubor: [Left:No] [Right:No] Blanched when Elevated: [Left:No No] [Right:No No] Toe Nail Assessment Left: Right: Thick: Yes Yes Discolored: Yes Yes Deformed: Yes Yes Improper Length and Hygiene: No No Electronic Signature(s) Signed: 02/19/2023  6:16:33 PM By: [name] RN, BSN Entered By: [name] on 02/19/2023 09:12:19 -------------------------------------------------------------------------------- Multi Wound Chart Details Patient Name: Date of Service: [name], [name] 02/19/2023 8:00 A M Medical Record Number: [id number] Patient Account Number: [id number] Date of Birth/Sex: Treating RN: 04/18/1956 (67 y.o. M) Primary Care Kamaya Keckler: [place] Other Clinician: Referring Chesney Klimaszewski: Treating Lyly Canizales/Extender: Shaune Pollack in Treatment: 8 Vital Signs Height(in): 72 Pulse(bpm): 57 Weight(lbs): 245 Blood Pressure(mmHg): 121/65 [name] [name], [name] [name] ([id number]) [id number] Page  3 of 12 Body Mass Index(BMI): 33.2 Temperature(F): 97.9 Respiratory Rate(breaths/min): 18 [3:Photos: No Photos] Right, Proximal, Lateral Lower Leg Left, Distal, Medial Lower Leg Right, Distal, Lateral Lower Leg Wound Location: Gradually Appeared Gradually Appeared Blister Wounding Event: Diabetic Wound/Ulcer of the Lower Diabetic Wound/Ulcer of the Lower Diabetic Wound/Ulcer of the Lower Primary Etiology: Extremity Extremity Extremity N/A Venous Leg Ulcer N/A Secondary Etiology: Anemia, Lymphedema, Hypertension, Anemia, Lymphedema, Hypertension, Anemia, Lymphedema, Hypertension, Comorbid History: Cirrhosis , Type II Diabetes Cirrhosis , Type II Diabetes Cirrhosis , Type II Diabetes 01/25/2023 02/11/2023 02/19/2023 Date Acquired: 2 1 0 Weeks of Treatment: Open Open Open Wound Status: No No No Wound Recurrence: 0.6x0.3x0.1 0.7x0.6x0.1 0.9x0.9x0.1 Measurements L x W x D (cm) 0.141 0.33 0.636 A (cm) : rea 0.014 0.033 0.064 Volume (cm) : -98.60% 14.30% N/A % Reduction in A rea: 0.00% 13.20% N/A % Reduction in Volume: Grade 1 Grade 1 Grade 1 Classification: Medium Medium Medium Exudate A mount: Serosanguineous Serosanguineous Serosanguineous Exudate Type: red, brown red, brown red,  brown Exudate Color: Distinct, outline attached Distinct, outline attached Distinct, outline attached Wound Margin: None Present (0%) Small (1-33%) None Present (0%) Granulation A mount: N/A Pink, Pale N/A Granulation Quality: Large (67-100%) Large (67-100%) Large (67-100%) Necrotic A mount: Adherent Slough Eschar, Adherent Slough Adherent Slough Necrotic Tissue: Fat Layer (Subcutaneous Tissue): Yes Fascia: No Fat Layer (Subcutaneous Tissue): Yes Exposed Structures: Fascia: No Fat Layer (Subcutaneous Tissue): No Fascia: No Tendon: No Tendon: No Tendon: No Muscle: No Muscle: No Muscle: No Joint: No Joint: No Joint: No Bone: No Bone: No Bone: No Medium (34-66%) Small (1-33%) None Epithelialization: Excoriation: No Scarring: Yes Excoriation: No Periwound Skin Texture: Induration: No Excoriation: No Induration: No Callus: No Induration: No Callus: No Crepitus: No Callus: No Crepitus: No Rash: No Crepitus: No Rash: No Scarring: No Rash: No Scarring: No Maceration: No Maceration: No Maceration: No Periwound Skin Moisture: Dry/Scaly: No Dry/Scaly: No Dry/Scaly: No Atrophie Blanche: No Hemosiderin Staining: Yes Atrophie Blanche: No Periwound Skin Color: Cyanosis: No Atrophie Blanche: No Cyanosis: No Ecchymosis: No Cyanosis: No Ecchymosis: No Erythema: No Ecchymosis: No Erythema: No Hemosiderin Staining: No Erythema: No Hemosiderin Staining: No Mottled: No Mottled: No Mottled: No Pallor: No Pallor: No Pallor: No Rubor: No Rubor: No Rubor: No Wound Number: 6 7 N/A Photos: N/A Left, Proximal, Posterior Lower Leg Left, Distal, Posterior Lower Leg N/A Wound Location: Blister Blister N/A Wounding Event: Diabetic Wound/Ulcer of the Lower Diabetic Wound/Ulcer of the Lower N/A Primary Etiology: Extremity Extremity N/A N/A N/A Secondary Etiology: Anemia, Lymphedema, Hypertension, Anemia, Lymphedema, Hypertension, N/A Comorbid  History: Cirrhosis , Type II Diabetes Cirrhosis , Type II Diabetes 02/19/2023 02/19/2023 N/A Date Acquired: GAINES, [name] [name] ([id number]) [id number] Page 4 of 12 0 0 N/A Weeks of Treatment: Open Open N/A Wound Status: No No N/A Wound Recurrence: 1.4x1.5x0.1 0.8x1x0.1 N/A Measurements L x W x D (cm) 1.649 0.628 N/A A (cm) : rea 0.165 0.063 N/A Volume (cm) : N/A N/A N/A % Reduction in A rea: N/A N/A N/A % Reduction in Volume: Grade 1 Grade 1 N/A Classification: Medium Medium N/A Exudate A mount: Serosanguineous Serosanguineous N/A Exudate Type: red, brown red, brown N/A Exudate Color: Distinct, outline attached Distinct, outline attached N/A Wound Margin: None Present (0%) Small (1-33%) N/A Granulation A mount: N/A N/A N/A Granulation Quality: Large (67-100%) Large (67-100%) N/A Necrotic A mount: Adherent Slough Adherent Slough N/A Necrotic Tissue: Fat Layer (Subcutaneous Tissue): Yes Fat Layer (Subcutaneous Tissue): Yes N/A Exposed Structures: Fascia: No Fascia: No  Tendon: No Tendon: No Muscle: No Muscle: No Joint: No Joint: No Bone: No Bone: No Small (1-33%) Small (1-33%) N/A Epithelialization: Excoriation: No Excoriation: No N/A Periwound Skin Texture: Induration: No Induration: No Callus: No Callus: No Crepitus: No Crepitus: No Rash: No Rash: No Scarring: No Scarring: No Maceration: No Maceration: No N/A Periwound Skin Moisture: Dry/Scaly: No Dry/Scaly: No Atrophie Blanche: No Atrophie Blanche: No N/A Periwound Skin Color: Cyanosis: No Cyanosis: No Ecchymosis: No Ecchymosis: No Erythema: No Erythema: No Hemosiderin Staining: No Hemosiderin Staining: No Mottled: No Mottled: No Pallor: No Pallor: No Rubor: No Rubor: No Treatment Notes Electronic Signature(s) Signed: 02/19/2023 4:51:26 PM By: Geralyn Corwin DO Entered By: Geralyn Corwin on 02/19/2023  09:17:24 -------------------------------------------------------------------------------- Multi-Disciplinary Care Plan Details Patient Name: Date of Service: [name], [name] 02/19/2023 8:00 A M Medical Record Number: [id number] Patient Account Number: [id number] Date of Birth/Sex: Treating RN: 08-14-1955 (67 y.o. [name] Primary Care Radiah Lubinski: [place] [facility] Other Clinician: Referring Shaneeka Scarboro: Treating River Mckercher/Extender: Shaune Pollack in Treatment: 8 Active Inactive Pain, Acute or Chronic Nursing Diagnoses: Pain, acute or chronic: actual or potential Potential alteration in comfort, pain Goals: Patient will verbalize adequate pain control and receive pain control interventions during procedures as needed Date Initiated: 12/24/2022 Target Resolution Date: 03/08/2023 Goal Status: Active Patient/caregiver will verbalize comfort level met Date Initiated: 12/24/2022 Date Inactivated: 01/31/2023 Target Resolution Date: 02/14/2023 [name] [name], [name] [name] ([id number]) [id number] Page 5 of 12 Goal Status: Met Interventions: Encourage patient to take pain medications as prescribed Provide education on pain management Treatment Activities: Administer pain control measures as ordered : 12/24/2022 Notes: Electronic Signature(s) Signed: 02/19/2023 6:16:33 PM By: [name] RN, BSN Entered By: [name] on 02/19/2023 09:15:08 -------------------------------------------------------------------------------- Pain Assessment Details Patient Name: Date of Service: [name], [name] 02/19/2023 8:00 A M Medical Record Number: [id number] Patient Account Number: [id number] Date of Birth/Sex: Treating RN: 1956/01/19 (67 y.o. M) Primary Care Lucio Litsey: [place] Other Clinician: Referring Journei Thomassen: Treating Seara Hinesley/Extender: Shaune Pollack in Treatment: 8 Active Problems Location of Pain Severity and Description of  Pain Patient Has Paino No Site Locations Pain Management and Medication Current Pain Management: Electronic Signature(s) Signed: 02/27/2023 2:28:10 PM By: Karl Ito Entered By: Karl Ito on 02/19/2023 08:52:15 -------------------------------------------------------------------------------- Patient/Caregiver Education Details Patient Name: Date of Service: [name], [name] 7/16/2024andnbsp8:00 A M Medical Record Number: [id number] Patient Account Number: [id number] [name] [name], [name] [name] ([id number]) [id number] Page 6 of 12 Date of Birth/Gender: Treating RN: February 23, 1956 (67 y.o. [name] Primary Care Physician: [place] Other Clinician: Referring Physician: Treating Physician/Extender: Shaune Pollack in Treatment: 8 Education Assessment Education Provided To: Patient Education Topics Provided Wound/Skin Impairment: Handouts: Caring for Your Ulcer Methods: Explain/Verbal Responses: Reinforcements needed Electronic Signature(s) Signed: 02/19/2023 6:16:33 PM By: [name] RN, BSN Entered By: [name] on 02/19/2023 09:15:19 -------------------------------------------------------------------------------- Wound Assessment Details Patient Name: Date of Service: [name], [name] 02/19/2023 8:00 A M Medical Record Number: [id number] Patient Account Number: [id number] Date of Birth/Sex: Treating RN: April 28, 1956 (67 y.o. M) Primary Care Charlisa Cham: [place] Other Clinician: Referring Marjon Doxtater: Treating Eland Lamantia/Extender: Shaune Pollack in Treatment: 8 Wound Status Wound Number: 3 Primary Diabetic Wound/Ulcer of the Lower Extremity Etiology: Wound Location: Right, Proximal, Lateral Lower Leg Wound Status: Open Wounding Event: Gradually Appeared Comorbid Anemia, Lymphedema, Hypertension, Cirrhosis , Type II Date Acquired: 01/25/2023 History: Diabetes Weeks Of Treatment: 2 Clustered  Wound: No Wound Measurements Length: (cm) 0.6 Width: (cm) 0.3 Depth: (cm) 0.1 Area: (cm) 0.141 Volume: (cm) 0.014 % Reduction in Area: -98.6% % Reduction in Volume: 0% Epithelialization: Medium (34-66%) Wound Description Classification: Grade 1 Wound Margin: Distinct, outline attached Exudate Amount: Medium Exudate Type: Serosanguineous Exudate Color: red, brown Foul Odor After Cleansing: No Slough/Fibrino Yes Wound Bed Granulation Amount: None Present (0%) Exposed Structure Necrotic Amount: Large (67-100%) Fascia Exposed: No Necrotic Quality: Adherent Slough Fat Layer (Subcutaneous Tissue) Exposed: Yes Tendon Exposed: No Muscle Exposed: No Joint Exposed: No Bone Exposed: No [address] [name] [name], [name] [name] ([id number]) [id number] Page 7 of 12 No Abnormalities Noted: No No Abnormalities Noted: No Callus: No Atrophie Blanche: No Crepitus: No Cyanosis: No Excoriation: No Ecchymosis: No Induration: No Erythema: No Rash: No Hemosiderin Staining: No Scarring: No Mottled: No Pallor: No Moisture Rubor: No No Abnormalities Noted: No Dry / Scaly: No Maceration: No Electronic Signature(s) Signed: 02/19/2023 6:16:33 PM By: [name] RN, BSN Entered By: [name] on 02/19/2023 09:12:27 -------------------------------------------------------------------------------- Wound Assessment Details Patient Name: Date of Service: [name], [name] 02/19/2023 8:00 A M Medical Record Number: [id number] Patient Account Number: [id number] Date of Birth/Sex: Treating RN: [birth year] (67 y.o. M) Primary Care Amos Gaber: [place] Other Clinician: Referring Chevy Virgo: Treating Mekel Haverstock/Extender: Lanney Gins Weeks in Treatment: 8 Wound Status Wound Number: 4 Primary Etiology: Diabetic Wound/Ulcer of the Lower Extremity Wound Location: Left, Distal, Medial Lower Leg Secondary Venous Leg  Ulcer Etiology: Wounding Event: Gradually Appeared Wound Status: Open Date Acquired: 02/11/2023 Comorbid Anemia, Lymphedema, Hypertension, Cirrhosis , Type II Weeks Of Treatment: 1 History: Diabetes Clustered Wound: No Photos Wound Measurements Length: (cm) 0.7 Width: (cm) 0.6 Depth: (cm) 0.1 Area: (cm) 0.33 Volume: (cm) 0.033 % Reduction in Area: 14.3% % Reduction in Volume: 13.2% Epithelialization: Small (1-33%) Tunneling: No Undermining: No Wound Description Classification: Grade 1 Wound Margin: Distinct, outline attached Exudate Amount: Medium Exudate Type: Serosanguineous Exudate Color: red, brown Foul Odor After Cleansing: No Slough/Fibrino Yes Wound Bed Granulation Amount: Small (1-33%) Exposed Structure Granulation Quality: Pink, Pale Fascia Exposed: No Necrotic Amount: Large (67-100%) Fat Layer (Subcutaneous Tissue) Exposed: No [name] [name], [name] [name] ([id number]) [id number] Page 8 of 12 Necrotic Quality: Eschar, Adherent Slough Tendon Exposed: No Muscle Exposed: No Joint Exposed: No Bone Exposed: No Periwound Skin Texture Texture Color No Abnormalities Noted: No No Abnormalities Noted: No Callus: No Atrophie Blanche: No Crepitus: No Cyanosis: No Excoriation: No Ecchymosis: No Induration: No Erythema: No Rash: No Hemosiderin Staining: Yes Scarring: Yes Mottled: No Pallor: No Moisture Rubor: No No Abnormalities Noted: No Dry / Scaly: No Maceration: No Electronic Signature(s) Signed: 02/19/2023 6:16:33 PM By: [name] RN, BSN Entered By: [name] on 02/19/2023 09:12:40 -------------------------------------------------------------------------------- Wound Assessment Details Patient Name: Date of Service: [name], [name] 02/19/2023 8:00 A M Medical Record Number: [id number] Patient Account Number: [id number] Date of Birth/Sex: Treating RN: 07/24/1956 (67 y.o. M) Primary Care Nala Kachel: [place] Other  Clinician: Referring Daphnie Venturini: Treating Coty Student/Extender: Shaune Pollack in Treatment: 8 Wound Status Wound Number: 5 Primary Diabetic Wound/Ulcer of the Lower Extremity Etiology: Wound Location: Right, Distal, Lateral Lower Leg Wound Status: Open Wounding Event: Blister Comorbid Anemia, Lymphedema, Hypertension, Cirrhosis , Type II Date Acquired: 02/19/2023 History: Diabetes Weeks Of Treatment: 0 Clustered Wound: No Photos Wound Measurements Length: (cm) 0.9 Width: (cm) 0.9 Depth: (cm) 0.1 Area: (cm) 0.636 Volume: (cm) 0.064 % Reduction in Area: % Reduction in  Volume: Epithelialization: None Tunneling: No Undermining: No Wound Description Classification: Grade 1 Wound Margin: Distinct, outline attached Exudate Amount: Medium [name] [name], [name] [name] ([id number]) Exudate Type: Serosanguineous Exudate Color: red, brown Foul Odor After Cleansing: No Slough/Fibrino Yes [id number] Page 9 of 12 Wound Bed Granulation Amount: None Present (0%) Exposed Structure Necrotic Amount: Large (67-100%) Fascia Exposed: No Necrotic Quality: Adherent Slough Fat Layer (Subcutaneous Tissue) Exposed: Yes Tendon Exposed: No Muscle Exposed: No Joint Exposed: No Bone Exposed: No Periwound Skin Texture Texture Color No Abnormalities Noted: No No Abnormalities Noted: No Callus: No Atrophie Blanche: No Crepitus: No Cyanosis: No Excoriation: No Ecchymosis: No Induration: No Erythema: No Rash: No Hemosiderin Staining: No Scarring: No Mottled: No Pallor: No Moisture Rubor: No No Abnormalities Noted: No Dry / Scaly: No Maceration: No Electronic Signature(s) Signed: 02/19/2023 6:16:33 PM By: [name] RN, BSN Entered By: [name] on 02/19/2023 09:13:06 -------------------------------------------------------------------------------- Wound Assessment Details Patient Name: Date of Service: [name], [name] 02/19/2023 8:00 A  M Medical Record Number: [id number] Patient Account Number: [id number] Date of Birth/Sex: Treating RN: 1955/08/14 (67 y.o. M) Primary Care Saheed Carrington: [place] Other Clinician: Referring Ursala Cressy: Treating Aniela Caniglia/Extender: Shaune Pollack in Treatment: 8 Wound Status Wound Number: 6 Primary Diabetic Wound/Ulcer of the Lower Extremity Etiology: Wound Location: Left, Proximal, Posterior Lower Leg Wound Status: Open Wounding Event: Blister Comorbid Anemia, Lymphedema, Hypertension, Cirrhosis , Type II Date Acquired: 02/19/2023 History: Diabetes Weeks Of Treatment: 0 Clustered Wound: No Photos Wound Measurements Length: (cm) 1.4 Width: (cm) 1.5 Depth: (cm) 0.1 [name] [name], [name] [name] ([id number]) Area: (cm) 1.649 Volume: (cm) 0.165 % Reduction in Area: % Reduction in Volume: Epithelialization: Small (1-33%) [id number] Page 10 of 12 Tunneling: No Undermining: No Wound Description Classification: Grade 1 Wound Margin: Distinct, outline attached Exudate Amount: Medium Exudate Type: Serosanguineous Exudate Color: red, brown Foul Odor After Cleansing: No Slough/Fibrino Yes Wound Bed Granulation Amount: None Present (0%) Exposed Structure Necrotic Amount: Large (67-100%) Fascia Exposed: No Necrotic Quality: Adherent Slough Fat Layer (Subcutaneous Tissue) Exposed: Yes Tendon Exposed: No Muscle Exposed: No Joint Exposed: No Bone Exposed: No Periwound Skin Texture Texture Color No Abnormalities Noted: No No Abnormalities Noted: No Callus: No Atrophie Blanche: No Crepitus: No Cyanosis: No Excoriation: No Ecchymosis: No Induration: No Erythema: No Rash: No Hemosiderin Staining: No Scarring: No Mottled: No Pallor: No Moisture Rubor: No No Abnormalities Noted: No Dry / Scaly: No Maceration: No Electronic Signature(s) Signed: 02/19/2023 6:16:33 PM By: [name] RN, BSN Entered By: [name] on 02/19/2023  09:13:26 -------------------------------------------------------------------------------- Wound Assessment Details Patient Name: Date of Service: [name], [name] 02/19/2023 8:00 A M Medical Record Number: [id number] Patient Account Number: [id number] Date of Birth/Sex: Treating RN: 12/11/1955 (67 y.o. M) Primary Care Malori Myers: [place] Other Clinician: Referring Maja Mccaffery: Treating Romelo Sciandra/Extender: Lanney Gins Weeks in Treatment: 8 Wound Status Wound Number: 7 Primary Diabetic Wound/Ulcer of the Lower Extremity Etiology: Wound Location: Left, Distal, Posterior Lower Leg Wound Status: Open Wounding Event: Blister Comorbid Anemia, Lymphedema, Hypertension, Cirrhosis , Type II Date Acquired: 02/19/2023 History: Diabetes Weeks Of Treatment: 0 Clustered Wound: No Photos [name] [name], [name] [name] ([id number]) [id number] Page 11 of 12 Wound Measurements Length: (cm) 0.8 Width: (cm) 1 Depth: (cm) 0.1 Area: (cm) 0.628 Volume: (cm) 0.063 % Reduction in Area: % Reduction in Volume: Epithelialization: Small (1-33%) Tunneling: No Undermining: No Wound Description Classification: Grade 1 Wound Margin: Distinct, outline attached Exudate Amount: Medium Exudate Type: Serosanguineous Exudate Color: red, brown  Foul Odor After Cleansing: No Slough/Fibrino Yes Wound Bed Granulation Amount: Small (1-33%) Exposed Structure Necrotic Amount: Large (67-100%) Fascia Exposed: No Necrotic Quality: Adherent Slough Fat Layer (Subcutaneous Tissue) Exposed: Yes Tendon Exposed: No Muscle Exposed: No Joint Exposed: No Bone Exposed: No Periwound Skin Texture Texture Color No Abnormalities Noted: No No Abnormalities Noted: No Callus: No Atrophie Blanche: No Crepitus: No Cyanosis: No Excoriation: No Ecchymosis: No Induration: No Erythema: No Rash: No Hemosiderin Staining: No Scarring: No Mottled: No Pallor: No Moisture Rubor: No No  Abnormalities Noted: No Dry / Scaly: No Maceration: No Electronic Signature(s) Signed: 02/19/2023 6:16:33 PM By: [name] RN, BSN Entered By: [name] on 02/19/2023 09:14:16 -------------------------------------------------------------------------------- Vitals Details Patient Name: Date of Service: [name], [name] 02/19/2023 8:00 A M Medical Record Number: [id number] Patient Account Number: [id number] Date of Birth/Sex: Treating RN: 03/08/1956 (67 y.o. M) Primary Care Lennox Dolberry: [place] Other Clinician: Referring Jc Veron: Treating Benny Deutschman/Extender: Shaune Pollack in Treatment: 8 Vital Signs [name] [name], [name] [name] ([id number]) [id number] Page 12 of 12 Time Taken: 08:51 Temperature (F): 97.9 Height (in): 72 Pulse (bpm): 57 Weight (lbs): 245 Respiratory Rate (breaths/min): 18 Body Mass Index (BMI): 33.2 Blood Pressure (mmHg): 121/65 Reference Range: 80 - 120 mg / dl Electronic Signature(s) Signed: 02/27/2023 2:28:10 PM By: Karl Ito Entered By: Karl Ito on 02/19/2023 08:52:09

## 2023-02-27 NOTE — Progress Notes (Signed)
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 History: Diabetes Weeks Of Treatment: 0 Clustered Wound: [name] Photos Wound Measurements Length: (cm) Width: (cm) Depth: (cm) Area: (cm) Volume: (cm) 0.9 % Reduction in Area: -33.3% 1.2 % Reduction in Volume: -32.8% 0.1 Epithelialization: None 0.848 Tunneling: [name] 0.085 Undermining: [name] Wound Description Classification: Grade 1 Wound Margin: Distinct, outline attached Exudate Amount: Medium Exudate Type: Serosanguineous Exudate Color: red, brown Foul Odor After Cleansing: [name] Slough/Fibrino Yes Wound Bed Granulation Amount: None Present (0%) Exposed Structure Necrotic Amount: Large (67-100%) Fascia Exposed: [name] Necrotic Quality: Adherent Slough Fat Layer (Subcutaneous Tissue) Exposed: Yes Tendon Exposed: [name] Muscle Exposed: [name] Joint Exposed: [name] Bone Exposed: [name] Periwound Skin Texture Texture Color [name] Abnormalities Noted: [name] [name] Abnormalities Noted: [name] Callus: [name] Atrophie Blanche: [name] Crepitus: [name] Cyanosis: [name] Excoriation: [name] Ecchymosis: [name] Induration: [name] Erythema: [name] Rash: [name] Hemosiderin Staining: [name] Scarring: [name] Mottled: [name] Pallor: [name] Moisture Rubor: [name] [name] Abnormalities Noted: [name] Dry / Scaly: [name] Maceration: [name] [name] [name], [name] [name] ([id number]) [id number] Page 12 of 16 Treatment Notes Wound #5 (Lower Leg) Wound Laterality:  Right, Lateral, Distal Cleanser Soap and Water Discharge Instruction: May shower and wash wound with dial antibacterial soap and water prior to dressing change. Peri-Wound Care Triamcinolone 15 (g) Discharge Instruction: Use triamcinolone apply to entire both legs. around the wounds. Topical Primary Dressing Hydrofera Blue Ready Transfer Foam, 2.5x2.5 (in/in) Discharge Instruction: Apply directly to wound bed as directed Santyl Ointment Discharge Instruction: Apply nickel thick amount to wound bed as instructed Secondary Dressing ABD Pad, 8x10 Discharge Instruction: Apply over primary dressing as directed. Secured With [employer], 4.5x3.1 (in/yd) Discharge Instruction: Secure with Kerlix as directed. 31M Medipore H Soft Cloth Surgical T ape, 4 x 10 (in/yd) Discharge Instruction: Secure with tape as directed. Compression Wrap Compression Stockings Add-Ons Electronic Signature(s) Signed: 02/27/2023 11:11:00 AM By: Brenton Grills Entered By: Brenton Grills on 02/25/2023 09:16:17 -------------------------------------------------------------------------------- Wound Assessment Details Patient Name: Date of  Service: [name] [name] [name] 02/25/2023 8:45 A M Medical Record Number: [id number] Patient Account Number: [id number] Date of Birth/Sex: Treating RN: 1956-01-10 (67 y.o. [name] [name] Primary Care Lynora Dymond: [place] Mon Other Clinician: Referring Albie Arizpe: Treating Kailo Kosik/Extender: Lanney Gins Weeks in Treatment: 9 Wound Status Wound Number: 6 Primary Diabetic Wound/Ulcer of the Lower Extremity Etiology: Wound Location: Left, Proximal, Posterior Lower Leg Wound Status: Open Wounding Event: Blister Comorbid Anemia, Lymphedema, Hypertension, Cirrhosis , Type II Date Acquired: 02/19/2023 History: Diabetes Weeks Of Treatment: 0 Clustered Wound: [name] Photos LAVARIUS, DOUGHTEN ([id number]) [id number] Page 13 of 16 Wound  Measurements Length: (cm) 1.2 Width: (cm) 2 Depth: (cm) 0.1 Area: (cm) 1.885 Volume: (cm) 0.188 % Reduction in Area: -14.3% % Reduction in Volume: -13.9% Epithelialization: Small (1-33%) Tunneling: [name] Undermining: [name] Wound Description Classification: Grade 1 Wound Margin: Distinct, outline attached Exudate Amount: Medium Exudate Type: Serosanguineous Exudate Color: red, brown Foul Odor After Cleansing: [name] Slough/Fibrino Yes Wound Bed Granulation Amount: None Present (0%) Exposed Structure Necrotic Amount: Large (67-100%) Fascia Exposed: [name] Necrotic Quality: Adherent Slough Fat Layer (Subcutaneous Tissue) Exposed: Yes Tendon Exposed: [name] Muscle Exposed: [name] Joint Exposed: [name] Bone Exposed: [name] Periwound Skin Texture Texture Color [name] Abnormalities Noted: [name] [name] Abnormalities Noted: [name] Callus: [name] Atrophie Blanche: [name] Crepitus: [name] Cyanosis: [name] Excoriation: [name] Ecchymosis: [name] Induration: [name] Erythema: [name] Rash: [name] Hemosiderin Staining: [name] Scarring: [name] Mottled: [name] Pallor: [name] Moisture Rubor: [name] [name] Abnormalities Noted: [name] Dry / Scaly: [name] Maceration: [name] Treatment Notes Wound #6 (Lower Leg) Wound Laterality: Left, Posterior, Proximal Cleanser Soap and Water Discharge Instruction: May shower and wash wound with dial antibacterial soap and water prior to dressing change. Peri-Wound Care Triamcinolone 15 (g) Discharge Instruction: Use triamcinolone apply to entire both legs. around the wounds. Topical Primary Dressing Hydrofera Blue Ready Transfer Foam, 2.5x2.5 (in/in) Discharge Instruction: Apply directly to wound bed as directed Santyl Ointment Discharge Instruction: Apply nickel thick amount to wound bed as instructed Secondary Dressing ABD Pad, 8x10 Discharge Instruction: Apply over primary dressing as directed. CAMEO, SHEWELL ([id number]) [id number] Page 14 of 16 Secured With [employer], 4.5x3.1 (in/yd) Discharge Instruction:  Secure with Kerlix as directed. 26M Medipore H Soft Cloth Surgical T ape, 4 x 10 (in/yd) Discharge Instruction: Secure with tape as directed. Compression Wrap Compression Stockings Add-Ons Electronic Signature(s) Signed: 02/27/2023 11:11:00 AM By: Brenton Grills Entered By: Brenton Grills on 02/25/2023 09:17:46 -------------------------------------------------------------------------------- Wound Assessment Details Patient Name: Date of Service: [name] [name] RRIS M. 02/25/2023 8:45 A M Medical Record Number: [id number] Patient Account Number: [id number] Date of Birth/Sex: Treating RN: 02/14/1956 (67 y.o. [name] [name] Primary Care Farhiya Rosten: [place] Mon Other Clinician: Referring Roshawnda Pecora: Treating Nalea Salce/Extender: Shaune Pollack in Treatment: 9 Wound Status Wound Number: 7 Primary Diabetic Wound/Ulcer of the Lower Extremity Etiology: Wound Location: Left, Distal, Posterior Lower Leg Wound Status: Open Wounding Event: Blister Comorbid Anemia, Lymphedema, Hypertension, Cirrhosis , Type II Date Acquired: 02/19/2023 History: Diabetes Weeks Of Treatment: 0 Clustered Wound: [name] Photos Wound Measurements Length: (cm) 0.7 Width: (cm) 0.7 Depth: (cm) 0.1 Area: (cm) 0.385 Volume: (cm) 0.038 % Reduction in Area: 38.7% % Reduction in Volume: 39.7% Epithelialization: Small (1-33%) Tunneling: [name] Undermining: [name] Wound Description Classification: Grade 1 Wound Margin: Distinct, outline attached Exudate Amount: Medium Exudate Type: Serosanguineous Exudate Color: red, brown Foul Odor After Cleansing: [name] Slough/Fibrino Yes Wound Bed Granulation Amount: Small (1-33%) Exposed Structure Necrotic Amount: Large (67-100%) Fascia Exposed: [name] Necrotic  Quality: Adherent Slough Fat Layer (Subcutaneous Tissue) Exposed: Yes Tendon Exposed: [name] MANAV, PIEROTTI ([id number]) [id number] Page 15 of 16 Muscle Exposed: [name] Joint Exposed: [name] Bone  Exposed: [name] Periwound Skin Texture Texture Color [name] Abnormalities Noted: [name] [name] Abnormalities Noted: [name] Callus: [name] Atrophie Blanche: [name] Crepitus: [name] Cyanosis: [name] Excoriation: [name] Ecchymosis: [name] Induration: [name] Erythema: [name] Rash: [name] Hemosiderin Staining: [name] Scarring: [name] Mottled: [name] Pallor: [name] Moisture Rubor: [name] [name] Abnormalities Noted: [name] Dry / Scaly: [name] Maceration: [name] Treatment Notes Wound #7 (Lower Leg) Wound Laterality: Left, Posterior, Distal Cleanser Soap and Water Discharge Instruction: May shower and wash wound with dial antibacterial soap and water prior to dressing change. Peri-Wound Care Triamcinolone 15 (g) Discharge Instruction: Use triamcinolone apply to entire both legs. around the wounds. Topical Primary Dressing Hydrofera Blue Ready Transfer Foam, 2.5x2.5 (in/in) Discharge Instruction: Apply directly to wound bed as directed Santyl Ointment Discharge Instruction: Apply nickel thick amount to wound bed as instructed Secondary Dressing ABD Pad, 8x10 Discharge Instruction: Apply over primary dressing as directed. Secured With [employer], 4.5x3.1 (in/yd) Discharge Instruction: Secure with Kerlix as directed. 79M Medipore H Soft Cloth Surgical T ape, 4 x 10 (in/yd) Discharge Instruction: Secure with tape as directed. Compression Wrap Compression Stockings Add-Ons Electronic Signature(s) Signed: 02/27/2023 11:11:00 AM By: Brenton Grills Entered By: Brenton Grills on 02/25/2023 09:19:06 -------------------------------------------------------------------------------- Vitals Details Patient Name: Date of Service: [name] [name], [name] RRIS M. 02/25/2023 8:45 A M Medical Record Number: [id number] Patient Account Number: [id number] Date of Birth/Sex: Treating RN: February 26, 1956 (67 y.o. [name] [name] Primary Care Sukhmani Fetherolf: [place] Mon Other Clinician: Referring Arelyn Gauer: Treating Jude Naclerio/Extender: Shaune Pollack in Treatment: [address],  [place] Judie Petit ([id number]) [phone].pdf Page 16 of 16 Vital Signs Time Taken: 08:10 Temperature (F): 98 Height (in): 72 Pulse (bpm): 53 Weight (lbs): 245 Respiratory Rate (breaths/min): 18 Body Mass Index (BMI): 33.2 Blood Pressure (mmHg): 161/69 Reference Range: 80 - 120 mg / dl Electronic Signature(s) Signed: 02/27/2023 11:11:00 AM By: Brenton Grills Entered By: Brenton Grills on 02/25/2023 09:37:30

## 2023-03-04 ENCOUNTER — Encounter (HOSPITAL_BASED_OUTPATIENT_CLINIC_OR_DEPARTMENT_OTHER): Payer: Medicare Other | Admitting: Internal Medicine

## 2023-03-04 DIAGNOSIS — I87313 Chronic venous hypertension (idiopathic) with ulcer of bilateral lower extremity: Secondary | ICD-10-CM | POA: Diagnosis not present

## 2023-03-04 DIAGNOSIS — L97828 Non-pressure chronic ulcer of other part of left lower leg with other specified severity: Secondary | ICD-10-CM

## 2023-03-04 DIAGNOSIS — L97818 Non-pressure chronic ulcer of other part of right lower leg with other specified severity: Secondary | ICD-10-CM | POA: Diagnosis not present

## 2023-03-04 DIAGNOSIS — E11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 other skin ulcer: Secondary | ICD-10-CM

## 2023-03-05 NOTE — Progress Notes (Signed)
JAYLIND, ELZINGA ([id number]) [id number] Page 1 of 9 Visit Report for 03/04/2023 Chief Complaint Document Details Patient Name: Date of Service: [name] [name]. 03/04/2023 8:45 A M Medical Record Number: [id number] Patient Account Number: [id number] Date of Birth/Sex: Treating RN: [birth year] (67 y.o. M) Primary Care Provider: Cranford Mon Other Clinician: Referring Provider: Treating Provider/Extender: Shaune Pollack in Treatment: 10 Information Obtained from: Patient Chief Complaint 12/24/2022; bilateral lower extremity wounds Electronic Signature(s) Signed: 03/04/2023 5:19:21 PM By: Geralyn Corwin DO Entered By: Geralyn Corwin on 03/04/2023 10:03:25 -------------------------------------------------------------------------------- HPI Details Patient Name: Date of Service: [name], [name] 03/04/2023 8:45 A M Medical Record Number: [id number] Patient Account Number: [id number] Date of Birth/Sex: Treating RN: 1955/12/23 (67 y.o. M) Primary Care Provider: Cranford Mon Other Clinician: Referring Provider: Treating Provider/Extender: Shaune Pollack in Treatment: 10 History of Present Illness HPI Description: 12/24/2022 Mr. Shreyash Masin is a 67 year old male with a past medical history of controlled type 2 diabetes on insulin, lymphedema/chronic venous insufficiency and cirrhosis that presents to the clinic for a 1 year history of nonhealing wounds to his lower extremities bilaterally. He reports the starting spontaneously. He states that he has been following with [facility] and they have been using [facility] antibiotic spray to the legs. He is not wearing compression stockings or wraps. He is also tried Medihoney and collagen in the past with little benefit. He currently denies systemic signs of infection. 5/30; patient presents for follow-up. He has been using triamcinolone cream to the periwound and  Santyl to the wound beds. There has been improvement in wound healing. He denies signs of infection and has no issues or complaints today. 6/4; both legs look considerably better. He has a wound on the right lateral lower leg and the left medial lower leg. Significant hemosiderin in the right leg less so on the left. We have been using Hydrofera Blue under compression 6/13; patient presents for follow-up. We have been using antibiotic ointment with Hydrofera Blue under compression therapy. The wounds are smaller. He has no issues or complaints today. We discussed ordering juxta lite compression garment wraps T use once his wounds heal as he has hard time putting on o compression stockings. He was agreeable with this. 6/20; patient presents for follow-up. We have been using antibiotic ointment with Hydrofera Blue under compression therapy to the lower extremities bilaterally. Wounds are smaller. He has been approved for PuraPly and was agreeable with placement today. He denies signs of infection. 6/27; patient presents for follow-up. We have been using antibiotic ointment with Hydrofera Blue under compression therapy to the left lower extremity. This wound is healed. He has his juxta lite compression wraps with him today. T the right lateral leg we have been using PuraPly under compression and that wound o is smaller today. Unfortunately he has developed skin breakdown from the Steri-Strip that was used to hold the PuraPly in place and this has created a new wound on the right leg. 7/8; patient presents for follow-up. We have been using PuraPly to the right lateral leg wound Under compression wrap. Wound is slightly smaller however original wound used for PuraPly is healed. He unfortunately developed a new wound to the left lower extremity but it looks like this was from potential scratching. He has been using his juxta lite compression here. 716; patient presents for follow-up. We have been using  antibiotic ointment with Hydrofera Blue under 3 layer compression  to lower extremities bilaterally. Unfortunately he has continued to develop wounds to the left lower extremity. He currently denies signs of infection. MOO, ZASTOUPIL ([id number]) [id number] Page 2 of 9 7/22; patient presents for follow-up. He has been using Santyl and Hydrofera Blue with triamcinolone cream under her juxta lite compression wraps daily. No new wounds today. Current wounds are stable. Less irritation to the periwound. 7/29; patient presents for follow-up. He has been using Santyl and Hydrofera Blue with triamcinolone cream under the juxta lite compression wraps. He is having a hard time keeping the [facility] in place to the wound beds. He denies signs of infection. Electronic Signature(s) Signed: 03/04/2023 5:19:21 PM By: Geralyn Corwin DO Entered By: Geralyn Corwin on 03/04/2023 10:04:43 -------------------------------------------------------------------------------- Physical Exam Details Patient Name: Date of Service: [name], [name] 03/04/2023 8:45 A M Medical Record Number: [id number] Patient Account Number: [id number] Date of Birth/Sex: Treating RN: 09/25/1955 (67 y.o. M) Primary Care Provider: Cranford Mon Other Clinician: Referring Provider: Treating Provider/Extender: Shaune Pollack in Treatment: 10 Constitutional respirations regular, non-labored and within target range for patient.. Cardiovascular 2+ dorsalis pedis/posterior tibialis pulses. Psychiatric pleasant and cooperative. Notes Right lower extremity: 1 open wound with granulation tissue and nonviable tissue, surrounding with a few areas of breakdown. No surrounding signs of infection. Venous stasis dermatitis. Left lower extremity: 3 open wounds with granulation tissue and nonviable tissue. Again no signs of surrounding infection. Venous stasis dermatitis. Decent edema  control. Electronic Signature(s) Signed: 03/04/2023 5:19:21 PM By: Geralyn Corwin DO Entered By: Geralyn Corwin on 03/04/2023 10:05:39 -------------------------------------------------------------------------------- Physician Orders Details Patient Name: Date of Service: [name], [name] 03/04/2023 8:45 A M Medical Record Number: [id number] Patient Account Number: [id number] Date of Birth/Sex: Treating RN: 1956/04/16 (67 y.o. [name] Primary Care Provider: Cranford Mon Other Clinician: Referring Provider: Treating Provider/Extender: Shaune Pollack in Treatment: 10 Verbal / Phone Orders: No Diagnosis Coding Follow-up Appointments ppointment in 1 week. - Dr. Mikey Bussing 03/11/23 @ 8:45 Return A Anesthetic (In clinic) Topical Lidocaine 4% applied to wound bed Bathing/ Shower/ Hygiene May shower with protection but do not get wound dressing(s) wet. Protect dressing(s) with water repellant cover (for example, large plastic bag) or a cast cover and may then take shower. JACORI, NAGARAJAN ([id number]) [id number] Page 3 of 9 Edema Control - Lymphedema / SCD / Other Elevate legs to the level of the heart or above for 30 minutes daily and/or when sitting for 3-4 times a day throughout the day. Avoid standing for long periods of time. Patient to wear own compression stockings every day. Exercise regularly Compression stocking or Garment 30-40 mm/Hg pressure to: - wear juxatlite HDs both legs. Wound Treatment Wound #3 - Lower Leg Wound Laterality: Right, Lateral, Proximal Cleanser: Soap and Water 1 x Per Day/30 Days Discharge Instructions: May shower and wash wound with dial antibacterial soap and water prior to dressing change. Peri-Wound Care: Triamcinolone 15 (g) 1 x Per Day/30 Days Discharge Instructions: Use triamcinolone apply to entire both legs. around the wounds. Prim Dressing: Santyl Ointment 1 x Per Day/30  Days ary Discharge Instructions: Apply nickel thick amount to wound bed as instructed Secondary Dressing: ABD Pad, 8x10 1 x Per Day/30 Days Discharge Instructions: Apply over primary dressing as directed. Secured With: [employer], 4.5x3.1 (in/yd) 1 x Per Day/30 Days Discharge Instructions: Secure with Kerlix as directed. Secured With: 76M Medipore H Soft Cloth Surgical T ape, 4 x  10 (in/yd) 1 x Per Day/30 Days Discharge Instructions: Secure with tape as directed. Wound #4 - Lower Leg Wound Laterality: Left, Medial, Distal Cleanser: Soap and Water 1 x Per Day/30 Days Discharge Instructions: May shower and wash wound with dial antibacterial soap and water prior to dressing change. Peri-Wound Care: Triamcinolone 15 (g) 1 x Per Day/30 Days Discharge Instructions: Use triamcinolone apply to entire both legs. around the wounds. Prim Dressing: Santyl Ointment 1 x Per Day/30 Days ary Discharge Instructions: Apply nickel thick amount to wound bed as instructed Secondary Dressing: ABD Pad, 8x10 (Generic) 1 x Per Day/30 Days Discharge Instructions: Apply over primary dressing as directed. Secured With: [employer], 4.5x3.1 (in/yd) 1 x Per Day/30 Days Discharge Instructions: Secure with Kerlix as directed. Secured With: 11M Medipore H Soft Cloth Surgical T ape, 4 x 10 (in/yd) 1 x Per Day/30 Days Discharge Instructions: Secure with tape as directed. Wound #5 - Lower Leg Wound Laterality: Right, Lateral, Distal Cleanser: Soap and Water 1 x Per Day/30 Days Discharge Instructions: May shower and wash wound with dial antibacterial soap and water prior to dressing change. Peri-Wound Care: Triamcinolone 15 (g) 1 x Per Day/30 Days Discharge Instructions: Use triamcinolone apply to entire both legs. around the wounds. Prim Dressing: Santyl Ointment 1 x Per Day/30 Days ary Discharge Instructions: Apply nickel thick amount to wound bed as instructed Secondary Dressing: ABD Pad, 8x10 (DME)  (Generic) 1 x Per Day/30 Days Discharge Instructions: Apply over primary dressing as directed. Secured With: [employer], 4.5x3.1 (in/yd) 1 x Per Day/30 Days Discharge Instructions: Secure with Kerlix as directed. Secured With: 11M Medipore H Soft Cloth Surgical T ape, 4 x 10 (in/yd) 1 x Per Day/30 Days Discharge Instructions: Secure with tape as directed. Wound #6 - Lower Leg Wound Laterality: Left, Posterior, Proximal Cleanser: Soap and Water 1 x Per Day/30 Days Discharge Instructions: May shower and wash wound with dial antibacterial soap and water prior to dressing change. Peri-Wound Care: Triamcinolone 15 (g) 1 x Per Day/30 Days Discharge Instructions: Use triamcinolone apply to entire both legs. around the wounds. Prim Dressing: Santyl Ointment 1 x Per Day/30 Days ary Discharge Instructions: Apply nickel thick amount to wound bed as instructed Secondary Dressing: ABD Pad, 8x10 1 x Per Day/30 Days SYRIS, VOSBURG ([id number]) [phone].pdf Page 4 of 9 Discharge Instructions: Apply over primary dressing as directed. Secured With: [employer], 4.5x3.1 (in/yd) 1 x Per Day/30 Days Discharge Instructions: Secure with Kerlix as directed. Secured With: 11M Medipore H Soft Cloth Surgical T ape, 4 x 10 (in/yd) 1 x Per Day/30 Days Discharge Instructions: Secure with tape as directed. Wound #7 - Lower Leg Wound Laterality: Left, Posterior, Distal Cleanser: Soap and Water 1 x Per Day/30 Days Discharge Instructions: May shower and wash wound with dial antibacterial soap and water prior to dressing change. Peri-Wound Care: Triamcinolone 15 (g) 1 x Per Day/30 Days Discharge Instructions: Use triamcinolone apply to entire both legs. around the wounds. Prim Dressing: Santyl Ointment 1 x Per Day/30 Days ary Discharge Instructions: Apply nickel thick amount to wound bed as instructed Secondary Dressing: ABD Pad, 8x10 (DME) (Generic) 1 x Per Day/30  Days Discharge Instructions: Apply over primary dressing as directed. Secured With: [employer], 4.5x3.1 (in/yd) 1 x Per Day/30 Days Discharge Instructions: Secure with Kerlix as directed. Secured With: 11M Medipore H Soft Cloth Surgical T ape, 4 x 10 (in/yd) 1 x Per Day/30 Days Discharge Instructions: Secure with tape as directed. Patient Medications  llergies: No Known Drug Allergies A Notifications Medication Indication Start End 03/04/2023 lidocaine DOSE topical 4 % cream - cream topical once daily Electronic Signature(s) Signed: 03/04/2023 5:19:21 PM By: Geralyn Corwin DO Signed: 03/04/2023 5:33:27 PM By: Redmond Pulling RN, BSN Entered By: Redmond Pulling on 03/04/2023 14:08:19 -------------------------------------------------------------------------------- Problem List Details Patient Name: Date of Service: [name] 03/04/2023 8:45 A M Medical Record Number: [id number] Patient Account Number: [id number] Date of Birth/Sex: Treating RN: 11-Jun-1956 (67 y.o. M) Primary Care Provider: Cranford Mon Other Clinician: Referring Provider: Treating Provider/Extender: Shaune Pollack in Treatment: 10 Active Problems ICD-10 Encounter Code Description Active Date MDM Diagnosis I87.313 Chronic venous hypertension (idiopathic) with ulcer of bilateral lower extremity 12/24/2022 No Yes L97.818 Non-pressure chronic ulcer of other part of right lower leg with other specified 12/24/2022 No Yes severity L97.828 Non-pressure chronic ulcer of other part of left lower leg with other specified 12/24/2022 No Yes TUG, REUM ([id number]) [phone].pdf Page 5 of 9 severity I89.0 Lymphedema, not elsewhere classified 12/24/2022 No Yes E11.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 other skin ulcer 12/24/2022 No Yes K74.69 Other cirrhosis of liver 12/24/2022 No Yes Inactive Problems Resolved Problems Electronic Signature(s) Signed: 03/04/2023 5:19:21  PM By: Geralyn Corwin DO Entered By: Geralyn Corwin on 03/04/2023 10:02:21 -------------------------------------------------------------------------------- Progress Note Details Patient Name: Date of Service: [name], [name] 03/04/2023 8:45 A M Medical Record Number: [id number] Patient Account Number: [id number] Date of Birth/Sex: Treating RN: [birth year] (67 y.o. M) Primary Care Provider: Cranford Mon Other Clinician: Referring Provider: Treating Provider/Extender: Shaune Pollack in Treatment: 10 Subjective Chief Complaint Information obtained from Patient 12/24/2022; bilateral lower extremity wounds History of Present Illness (HPI) 12/24/2022 Mr. Tiree Krumwiede is a 67 year old male with a past medical history of controlled type 2 diabetes on insulin, lymphedema/chronic venous insufficiency and cirrhosis that presents to the clinic for a 1 year history of nonhealing wounds to his lower extremities bilaterally. He reports the starting spontaneously. He states that he has been following with [facility] and they have been using [facility] antibiotic spray to the legs. He is not wearing compression stockings or wraps. He is also tried Medihoney and collagen in the past with little benefit. He currently denies systemic signs of infection. 5/30; patient presents for follow-up. He has been using triamcinolone cream to the periwound and Santyl to the wound beds. There has been improvement in wound healing. He denies signs of infection and has no issues or complaints today. 6/4; both legs look considerably better. He has a wound on the right lateral lower leg and the left medial lower leg. Significant hemosiderin in the right leg less so on the left. We have been using Hydrofera Blue under compression 6/13; patient presents for follow-up. We have been using antibiotic ointment with Hydrofera Blue under compression therapy. The wounds are smaller. He has no issues  or complaints today. We discussed ordering juxta lite compression garment wraps T use once his wounds heal as he has hard time putting on o compression stockings. He was agreeable with this. 6/20; patient presents for follow-up. We have been using antibiotic ointment with Hydrofera Blue under compression therapy to the lower extremities bilaterally. Wounds are smaller. He has been approved for PuraPly and was agreeable with placement today. He denies signs of infection. 6/27; patient presents for follow-up. We have been using antibiotic ointment with Hydrofera Blue under compression therapy to the left lower extremity. This wound  is healed. He has his juxta lite compression wraps with him today. T the right lateral leg we have been using PuraPly under compression and that wound o is smaller today. Unfortunately he has developed skin breakdown from the Steri-Strip that was used to hold the PuraPly in place and this has created a new wound on the right leg. 7/8; patient presents for follow-up. We have been using PuraPly to the right lateral leg wound Under compression wrap. Wound is slightly smaller however original wound used for PuraPly is healed. He unfortunately developed a new wound to the left lower extremity but it looks like this was from potential scratching. He has been using his juxta lite compression here. 716; patient presents for follow-up. We have been using antibiotic ointment with Hydrofera Blue under 3 layer compression to lower extremities bilaterally. Unfortunately he has continued to develop wounds to the left lower extremity. He currently denies signs of infection. CREW, OCANA ([id number]) [id number] Page 6 of 9 7/22; patient presents for follow-up. He has been using Santyl and Hydrofera Blue with triamcinolone cream under her juxta lite compression wraps daily. No new wounds today. Current wounds are stable. Less irritation to the  periwound. 7/29; patient presents for follow-up. He has been using Santyl and Hydrofera Blue with triamcinolone cream under the juxta lite compression wraps. He is having a hard time keeping the [facility] in place to the wound beds. He denies signs of infection. Patient History Family History Diabetes - Father, Heart Disease - Father. Social History Current every day smoker, Marital Status - Single, Alcohol Use - Never, Drug Use - No History, Caffeine Use - Never. Medical History Hematologic/Lymphatic Patient has history of Anemia, Lymphedema Cardiovascular Patient has history of Hypertension Gastrointestinal Patient has history of Cirrhosis - crpytogenic Endocrine Patient has history of Type II Diabetes - 6.2 HgbA1c Medical A Surgical History Notes nd Gastrointestinal GERD Genitourinary stage III CKD Objective Constitutional respirations regular, non-labored and within target range for patient.. Vitals Time Taken: 8:56 AM, Height: 72 in, Weight: 245 lbs, BMI: 33.2, Temperature: 97.8 F, Pulse: 49 bpm, Respiratory Rate: 16 breaths/min, Blood Pressure: 136/73 mmHg. Cardiovascular 2+ dorsalis pedis/posterior tibialis pulses. Psychiatric pleasant and cooperative. General Notes: Right lower extremity: 1 open wound with granulation tissue and nonviable tissue, surrounding with a few areas of breakdown. No surrounding signs of infection. Venous stasis dermatitis. Left lower extremity: 3 open wounds with granulation tissue and nonviable tissue. Again no signs of surrounding infection. Venous stasis dermatitis. Decent edema control. Integumentary (Hair, Skin) Wound #3 status is Open. Original cause of wound was Gradually Appeared. The date acquired was: 01/25/2023. The wound has been in treatment 4 weeks. The wound is located on the Right,Proximal,Lateral Lower Leg. The wound measures 1cm length x 0.8cm width x 0.1cm depth; 0.628cm^2 area and 0.063cm^3 volume. There is Fat Layer  (Subcutaneous Tissue) exposed. There is no tunneling or undermining noted. There is a medium amount of serosanguineous drainage noted. The wound margin is distinct with the outline attached to the wound base. There is no granulation within the wound bed. There is a large (67- 100%) amount of necrotic tissue within the wound bed including Adherent Slough. The periwound skin appearance did not exhibit: Callus, Crepitus, Excoriation, Induration, Rash, Scarring, Dry/Scaly, Maceration, Atrophie Blanche, Cyanosis, Ecchymosis, Hemosiderin Staining, Mottled, Pallor, Rubor, Erythema. Periwound temperature was noted as No Abnormality. Wound #4 status is Open. Original cause of wound was Gradually Appeared. The date acquired was: 02/11/2023. The wound has been in treatment  3 weeks. The wound is located on the Left,Distal,Medial Lower Leg. The wound measures 0.5cm length x 0.6cm width x 0.1cm depth; 0.236cm^2 area and 0.024cm^3 volume. There is no tunneling or undermining noted. There is a medium amount of serosanguineous drainage noted. The wound margin is distinct with the outline attached to the wound base. There is medium (34-66%) pale granulation within the wound bed. There is a medium (34-66%) amount of necrotic tissue within the wound bed including Adherent Slough. The periwound skin appearance exhibited: Scarring, Hemosiderin Staining. The periwound skin appearance did not exhibit: Callus, Crepitus, Excoriation, Induration, Rash, Dry/Scaly, Maceration, Atrophie Blanche, Cyanosis, Ecchymosis, Mottled, Pallor, Rubor, Erythema. Periwound temperature was noted as No Abnormality. Wound #5 status is Open. Original cause of wound was Blister. The date acquired was: 02/19/2023. The wound has been in treatment 1 weeks. The wound is located on the Right,Distal,Lateral Lower Leg. The wound measures 0.5cm length x 0.4cm width x 0.1cm depth; 0.157cm^2 area and 0.016cm^3 volume. There is Fat Layer (Subcutaneous Tissue)  exposed. There is no tunneling or undermining noted. There is a medium amount of serosanguineous drainage noted. The wound margin is distinct with the outline attached to the wound base. There is no granulation within the wound bed. There is a large (67-100%) amount of necrotic tissue within the wound bed including Adherent Slough. The periwound skin appearance did not exhibit: Callus, Crepitus, Excoriation, Induration, Rash, Scarring, Dry/Scaly, Maceration, Atrophie Blanche, Cyanosis, Ecchymosis, Hemosiderin Staining, Mottled, Pallor, Rubor, Erythema. Periwound temperature was noted as No Abnormality. Wound #6 status is Open. Original cause of wound was Blister. The date acquired was: 02/19/2023. The wound has been in treatment 1 weeks. The wound is located on the Left,Proximal,Posterior Lower Leg. The wound measures 1.5cm length x 1cm width x 0.1cm depth; 1.178cm^2 area and 0.118cm^3 volume. There is Fat Layer (Subcutaneous Tissue) exposed. There is no tunneling or undermining noted. There is a medium amount of serosanguineous drainage noted. The wound margin is distinct with the outline attached to the wound base. There is no granulation within the wound bed. There is a large (67-100%) amount of necrotic tissue within the wound bed including Adherent Slough. The periwound skin appearance did not exhibit: Callus, Crepitus, Excoriation, Induration, Rash, Scarring, Dry/Scaly, Maceration, Atrophie Blanche, Cyanosis, Ecchymosis, Hemosiderin Staining, Mottled, Pallor, Rubor, Erythema. [address] MELIK, ODONALD ([id number]) [id number] Page 7 of 9 was noted as No Abnormality. Wound #7 status is Open. Original cause of wound was Blister. The date acquired was: 02/19/2023. The wound has been in treatment 1 weeks. The wound is located on the Left,Distal,Posterior Lower Leg. The wound measures 0.9cm length x 1cm width x 0.1cm depth; 0.707cm^2 area and 0.071cm^3 volume.  There is Fat Layer (Subcutaneous Tissue) exposed. There is no tunneling or undermining noted. There is a medium amount of serosanguineous drainage noted. The wound margin is distinct with the outline attached to the wound base. There is no granulation within the wound bed. There is a large (67-100%) amount of necrotic tissue within the wound bed including Adherent Slough. The periwound skin appearance did not exhibit: Callus, Crepitus, Excoriation, Induration, Rash, Scarring, Dry/Scaly, Maceration, Atrophie Blanche, Cyanosis, Ecchymosis, Hemosiderin Staining, Mottled, Pallor, Rubor, Erythema. Periwound temperature was noted as No Abnormality. 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 They appear healing albeit slowly. I recommended continuing with Santyl. Since he is having trouble with Hydrofera Blue staying in place I recommended stopping this and just covering with an ABD pad under Kerlix. Continue juxta lite compression Velcro wraps daily. Follow-up in 1 week. Patient has not heard from dermatology T schedule consult. o Plan Follow-up Appointments: Return Appointment in 1 week. - Dr. Mikey Bussing 03/11/23 @ 8:45 Anesthetic: (In clinic) Topical Lidocaine 4% applied to wound bed Bathing/ Shower/ Hygiene: May shower with protection but do not get wound dressing(s) wet. Protect dressing(s) with water repellant cover (for example, large plastic bag) or a cast cover and may then take shower. Edema Control - Lymphedema / SCD / Other: Elevate legs to the level of the heart or above for 30 minutes daily and/or when  sitting for 3-4 times a day throughout the day. Avoid standing for long periods of time. Patient to wear own compression stockings every day. Exercise regularly Compression stocking or Garment 30-40 mm/Hg pressure to: - wear juxatlite HDs both legs. The following medication(s) was prescribed: lidocaine topical 4 % cream cream topical once daily was prescribed at facility WOUND #3: - Lower Leg Wound Laterality: Right, Lateral, Proximal Cleanser: Soap and Water 1 x Per Day/30 Days Discharge Instructions: May shower and wash wound with dial antibacterial soap and water prior to dressing change. Peri-Wound Care: Triamcinolone 15 (g) 1 x Per Day/30 Days Discharge Instructions: Use triamcinolone apply to entire both legs. around the wounds. Prim Dressing: Santyl Ointment 1 x Per Day/30 Days ary Discharge Instructions: Apply nickel thick amount to wound bed as instructed Secondary Dressing: ABD Pad, 8x10 1 x Per Day/30 Days Discharge Instructions: Apply over primary dressing as directed. Secured With: [employer], 4.5x3.1 (in/yd) 1 x Per Day/30 Days Discharge Instructions: Secure with Kerlix as directed. Secured With: 7M Medipore H Soft Cloth Surgical T ape, 4 x 10 (in/yd) 1 x Per Day/30 Days Discharge Instructions: Secure with tape as directed. WOUND #4: - Lower Leg Wound Laterality: Left, Medial, Distal Cleanser: Soap and Water 1 x Per Day/30 Days Discharge Instructions: May shower and wash wound with dial antibacterial soap and water prior to dressing change. Peri-Wound Care: Triamcinolone 15 (g) 1 x Per Day/30 Days Discharge Instructions: Use triamcinolone apply to entire both legs. around the wounds. Prim Dressing: Santyl Ointment 1 x Per Day/30 Days ary Discharge Instructions: Apply nickel thick amount to wound bed as instructed Secondary Dressing: ABD Pad, 8x10 (DME) (Generic) 1 x Per Day/30 Days Discharge Instructions: Apply over primary dressing as directed. Secured With:  [employer], 4.5x3.1 (in/yd) 1 x Per Day/30 Days Discharge Instructions: Secure with Kerlix as directed. Secured With: 7M Medipore H Soft Cloth Surgical T ape, 4 x 10 (in/yd) 1 x Per Day/30 Days Discharge Instructions: Secure with tape as directed. WOUND #5: - Lower Leg Wound Laterality: Right, Lateral, Distal Cleanser: Soap and Water 1 x Per Day/30 Days Discharge Instructions: May shower and wash wound with dial antibacterial soap and water prior to dressing change. Peri-Wound Care: Triamcinolone 15 (g) 1 x Per Day/30 Days Discharge Instructions: Use triamcinolone apply to entire both legs. around the wounds. Prim Dressing: Santyl Ointment 1 x Per Day/30 Days ary Discharge Instructions: Apply nickel thick amount to wound bed as instructed Secondary Dressing: ABD Pad, 8x10 (DME) (Generic) 1 x Per Day/30 Days Discharge Instructions: Apply over primary dressing as directed. Montez Morita, Heyden M (  [id number]) [id number] Page 8 of 9 Secured With: [employer], 4.5x3.1 (in/yd) 1 x Per Day/30 Days Discharge Instructions: Secure with Kerlix as directed. Secured With: 60M Medipore H Soft Cloth Surgical T ape, 4 x 10 (in/yd) 1 x Per Day/30 Days Discharge Instructions: Secure with tape as directed. WOUND #6: - Lower Leg Wound Laterality: Left, Posterior, Proximal Cleanser: Soap and Water 1 x Per Day/30 Days Discharge Instructions: May shower and wash wound with dial antibacterial soap and water prior to dressing change. Peri-Wound Care: Triamcinolone 15 (g) 1 x Per Day/30 Days Discharge Instructions: Use triamcinolone apply to entire both legs. around the wounds. Prim Dressing: Santyl Ointment 1 x Per Day/30 Days ary Discharge Instructions: Apply nickel thick amount to wound bed as instructed Secondary Dressing: ABD Pad, 8x10 1 x Per Day/30 Days Discharge Instructions: Apply over primary dressing as directed. Secured With: [employer], 4.5x3.1 (in/yd) 1  x Per Day/30 Days Discharge Instructions: Secure with Kerlix as directed. Secured With: 60M Medipore H Soft Cloth Surgical T ape, 4 x 10 (in/yd) 1 x Per Day/30 Days Discharge Instructions: Secure with tape as directed. WOUND #7: - Lower Leg Wound Laterality: Left, Posterior, Distal Cleanser: Soap and Water 1 x Per Day/30 Days Discharge Instructions: May shower and wash wound with dial antibacterial soap and water prior to dressing change. Peri-Wound Care: Triamcinolone 15 (g) 1 x Per Day/30 Days Discharge Instructions: Use triamcinolone apply to entire both legs. around the wounds. Prim Dressing: Santyl Ointment 1 x Per Day/30 Days ary Discharge Instructions: Apply nickel thick amount to wound bed as instructed Secondary Dressing: ABD Pad, 8x10 1 x Per Day/30 Days Discharge Instructions: Apply over primary dressing as directed. Secured With: [employer], 4.5x3.1 (in/yd) 1 x Per Day/30 Days Discharge Instructions: Secure with Kerlix as directed. Secured With: 60M Medipore H Soft Cloth Surgical T ape, 4 x 10 (in/yd) 1 x Per Day/30 Days Discharge Instructions: Secure with tape as directed. 1. Santyl 2. Juxta lite compression garments daily 3. Follow-up in 1 week Electronic Signature(s) Signed: 03/04/2023 5:19:21 PM By: Geralyn Corwin DO Entered By: Geralyn Corwin on 03/04/2023 10:27:45 -------------------------------------------------------------------------------- HxROS Details Patient Name: Date of Service: [name], [name] 03/04/2023 8:45 A M Medical Record Number: [id number] Patient Account Number: [id number] Date of Birth/Sex: Treating RN: 1956-01-09 (67 y.o. M) Primary Care Provider: Cranford Mon Other Clinician: Referring Provider: Treating Provider/Extender: Shaune Pollack in Treatment: 10 Hematologic/Lymphatic Medical History: Positive for: Anemia; Lymphedema Cardiovascular Medical History: Positive for:  Hypertension Gastrointestinal Medical History: Positive for: Cirrhosis - crpytogenic Past Medical History Notes: GERD Endocrine Medical History: Positive for: Type II Diabetes - 6.2 HgbA1c Time with diabetes: 20 yeatrs Treated with: Insulin KAMAI, NOWICKI ([id number]) [id number] Page 9 of 9 Genitourinary Medical History: Past Medical History Notes: stage III CKD Immunizations Pneumococcal Vaccine: Received Pneumococcal Vaccination: No Implantable Devices No devices added Family and Social History Diabetes: Yes - Father; Heart Disease: Yes - Father; Current every day smoker; Marital Status - Single; Alcohol Use: Never; Drug Use: No History; Caffeine Use: Never; Financial Concerns: No; Food, Clothing or Shelter Needs: No; Support System Lacking: No; Transportation Concerns: No Electronic Signature(s) Signed: 03/04/2023 5:19:21 PM By: Geralyn Corwin DO Entered By: Geralyn Corwin on 03/04/2023 10:04:47 -------------------------------------------------------------------------------- SuperBill Details Patient Name: Date of Service: [name], [name] 03/04/2023 Medical Record Number: [id number] Patient Account Number: [id number] Date of Birth/Sex: Treating RN: 08/30/1955 (67 y.o. 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 L97.828 Non-pressure chronic ulcer of other part of left lower leg with other specified severity I89.0 Lymphedema, not elsewhere classified E11.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 other skin ulcer K74.69 Other cirrhosis of liver Facility Procedures : CPT4 Code:  [id number] Description: 99214 - WOUND CARE VISIT-LEV 4 EST PT Modifier: Quantity: 1 Physician Procedures : CPT4 Code Description Modifier [id number] - WC PHYS LEVEL 3 - EST PT ICD-10 Diagnosis Description I87.313 Chronic venous hypertension (idiopathic) with ulcer of bilateral lower extremity L97.818 Non-pressure chronic ulcer of other part of right  lower leg with other specified severity L97.828 Non-pressure chronic ulcer of other part of left lower leg with other specified severity E11.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 other skin ulcer Quantity: 1 Electronic Signature(s) Signed: 03/04/2023 5:19:21 PM By: Geralyn Corwin DO Entered By: Geralyn Corwin on 03/04/2023 10:28:35

## 2023-03-05 NOTE — Progress Notes (Signed)
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 Temperature: [name] [name] Abnormality Electronic Signature(s) Signed: 03/04/2023 5:33:27 PM By: Redmond Pulling RN, BSN Entered By: Redmond Pulling on 03/04/2023 13:58:59 -------------------------------------------------------------------------------- Wound Assessment Details Patient Name: Date of Service: [name] [name] [name] [name]. 03/04/2023 8:45 A [name] Medical Record Number: [id number] Patient Account Number: [id number] Date of Birth/Sex: Treating RN: [birth year] (67 y.o. [name]) Primary Care Zyonna Vardaman: [place] [facility] Other Clinician: Referring Darryll Raju: Treating Citlalic Norlander/Extender: Lanney Gins Weeks in Treatment: 10 Wound Status Wound Number: 5 Primary Diabetic Wound/Ulcer of the Lower Extremity Etiology: Wound Location: Right, Distal, Lateral Lower Leg Wound Status: Open Wounding Event: Blister Comorbid Anemia, Lymphedema, Hypertension, Cirrhosis , Type II Date Acquired: 02/19/2023 History: Diabetes Weeks Of Treatment: 1 Clustered Wound: [name] [name] Photos [name] [name] [name], [name] [name] [name] ([id number]) [id number] Page 11 of 15 Wound Measurements Length: (cm) 0.5 Width: (cm) 0.4 Depth: (cm) 0.1 Area: (cm) 0.157 Volume: (cm) 0.016 % Reduction in Area: 75.3% % Reduction in Volume: 75% Epithelialization: None Tunneling: [name] [name] Undermining:  [name] [name] Wound Description Classification: Grade 1 Wound Margin: Distinct, outline attached Exudate Amount: Medium Exudate Type: Serosanguineous Exudate Color: red, brown Foul Odor After Cleansing: [name] [name] Slough/Fibrino Yes Wound Bed Granulation Amount: None Present (0%) Exposed Structure Necrotic Amount: Large (67-100%) Fascia Exposed: [name] [name] Necrotic Quality: Adherent Slough Fat Layer (Subcutaneous Tissue) Exposed: Yes Tendon Exposed: [name] [name] Muscle Exposed: [name] [name] Joint Exposed: [name] [name] Bone Exposed: [name] [name] Periwound Skin Texture Texture Color [name] [name] Abnormalities Noted: [name] [name] [name] [name] Abnormalities Noted: [name] [name] Callus: [name] [name] Atrophie Blanche: [name] [name] Crepitus: [name] [name] Cyanosis: [name] [name] Excoriation: [name] [name] Ecchymosis: [name] [name] Induration: [name] [name] Erythema: [name] [name] Rash: [name] [name] Hemosiderin Staining: [name] [name] Scarring: [name] [name] Mottled: [name] [name] Pallor: [name] [name] Moisture Rubor: [name] [name] [name] [name] Abnormalities Noted: [name] [name] Dry / Scaly: [name] [name] Temperature / Pain Maceration: [name] [name] Temperature: [name] [name] Abnormality Treatment Notes Wound #5 (Lower Leg) Wound Laterality: Right, Lateral, Distal Cleanser Soap and Water Discharge Instruction: May shower and wash wound with dial antibacterial soap and water prior to dressing change. Peri-Wound Care Triamcinolone 15 (g) Discharge Instruction: Use triamcinolone apply to entire both legs. around the wounds. Topical Primary Dressing Santyl Ointment Discharge Instruction: Apply nickel thick amount to wound bed as instructed Secondary Dressing ABD Pad, 8x10 Discharge Instruction: Apply over primary dressing as directed. Secured With [employer], 4.5x3.1 (in/yd) JERMAR, FERRETT ([id number]) [id number] Page 12 of 15 Discharge Instruction: Secure with Kerlix as directed. 74M Medipore H Soft Cloth Surgical T ape, 4 x 10 (in/yd) Discharge Instruction: Secure with tape as directed. Compression Wrap Compression Stockings Add-Ons Electronic Signature(s) Signed: 03/04/2023 4:41:11 PM By: [name] [name] [name] Entered By: [name] [name] [name]  on 03/04/2023 09:15:15 -------------------------------------------------------------------------------- Wound Assessment Details Patient Name: Date of Service: [name] [name] [name] [name]. 03/04/2023 8:45 A [name] Medical Record Number: [id number] Patient Account Number: [id number] Date of Birth/Sex: Treating RN: [birth year] (67 y.o. [name]) Primary Care Anber Mckiver: [place] [facility] Other Clinician: Referring Martita Brumm: Treating Muhammad Vacca/Extender: Lanney Gins Weeks in Treatment: 10 Wound Status Wound Number: 6 Primary Diabetic Wound/Ulcer of the Lower Extremity Etiology: Wound Location: Left, Proximal, Posterior Lower Leg Wound Status: Open Wounding Event: Blister Comorbid Anemia, Lymphedema, Hypertension, Cirrhosis , Type II Date Acquired: 02/19/2023 History: Diabetes Weeks Of Treatment: 1 Clustered Wound: [name] [name] Photos Wound Measurements Length: (cm) 1.5 Width: (cm) 1 Depth: (cm) 0.1 Area: (cm) 1.178 Volume: (cm) 0.118 % Reduction in Area: 28.6% % Reduction in Volume: 28.5% Epithelialization:  None Tunneling: [name] [name] Undermining: [name] [name] Wound Description Classification: Grade 1 Wound Margin: Distinct, outline attached Exudate Amount: Medium Exudate Type: Serosanguineous Exudate Color: red, brown Foul Odor After Cleansing: [name] [name] Slough/Fibrino Yes Wound Bed Granulation Amount: None Present (0%) Exposed Structure Necrotic Amount: Large (67-100%) Fascia Exposed: [name] [name] Necrotic Quality: Adherent Slough Fat Layer (Subcutaneous Tissue) Exposed: Yes Tendon Exposed: [name] [name] Muscle Exposed: [name] [name] Joint Exposed: [name] [name] [name] [name] [name], [name] [name] [name] ([id number]) [id number] Page 13 of 15 Bone Exposed: [name] [name] Periwound Skin Texture Texture Color [name] [name] Abnormalities Noted: [name] [name] [name] [name] Abnormalities Noted: [name] [name] Callus: [name] [name] Atrophie Blanche: [name] [name] Crepitus: [name] [name] Cyanosis: [name] [name] Excoriation: [name] [name] Ecchymosis: [name] [name] Induration: [name] [name] Erythema: [name] [name] Rash: [name] [name] Hemosiderin Staining: [name] [name] Scarring: [name] [name] Mottled: [name] [name] Pallor:  [name] [name] Moisture Rubor: [name] [name] [name] [name] Abnormalities Noted: [name] [name] Dry / Scaly: [name] [name] Temperature / Pain Maceration: [name] [name] Temperature: [name] [name] Abnormality Treatment Notes Wound #6 (Lower Leg) Wound Laterality: Left, Posterior, Proximal Cleanser Soap and Water Discharge Instruction: May shower and wash wound with dial antibacterial soap and water prior to dressing change. Peri-Wound Care Triamcinolone 15 (g) Discharge Instruction: Use triamcinolone apply to entire both legs. around the wounds. Topical Primary Dressing Santyl Ointment Discharge Instruction: Apply nickel thick amount to wound bed as instructed Secondary Dressing ABD Pad, 8x10 Discharge Instruction: Apply over primary dressing as directed. Secured With [employer], 4.5x3.1 (in/yd) Discharge Instruction: Secure with Kerlix as directed. 76M Medipore H Soft Cloth Surgical T ape, 4 x 10 (in/yd) Discharge Instruction: Secure with tape as directed. Compression Wrap Compression Stockings Add-Ons Electronic Signature(s) Signed: 03/04/2023 4:41:11 PM By: [name] [name] [name] Entered By: [name] [name] [name] on 03/04/2023 09:18:01 -------------------------------------------------------------------------------- Wound Assessment Details Patient Name: Date of Service: [name] [name] [name] [name]. 03/04/2023 8:45 A [name] Medical Record Number: [id number] Patient Account Number: [id number] Date of Birth/Sex: Treating RN: 31-Dec-1955 (66 y.o. [name]) Primary Care Chasty Randal: [place] [facility] Other Clinician: Referring Bettyjean Stefanski: Treating Mariellen Blaney/Extender: Lanney Gins Weeks in Treatment: 10 Wound Status Wound Number: 7 Primary Diabetic Wound/Ulcer of the Lower Extremity Etiology: Wound Location: Left, Distal, Posterior Lower Leg Wound Status: Open Wounding Event: [name] [name] [name], [name] [name] [name] ([id number]) [id number] Page 14 of 15 Comorbid Anemia, Lymphedema, Hypertension, Cirrhosis , Type II Date Acquired: 02/19/2023 History:  Diabetes Weeks Of Treatment: 1 Clustered Wound: [name] [name] Photos Wound Measurements Length: (cm) 0.9 Width: (cm) 1 Depth: (cm) 0.1 Area: (cm) 0.707 Volume: (cm) 0.071 % Reduction in Area: -12.6% % Reduction in Volume: -12.7% Epithelialization: Small (1-33%) Tunneling: [name] [name] Undermining: [name] [name] Wound Description Classification: Grade 1 Wound Margin: Distinct, outline attached Exudate Amount: Medium Exudate Type: Serosanguineous Exudate Color: red, brown Foul Odor After Cleansing: [name] [name] Slough/Fibrino Yes Wound Bed Granulation Amount: None Present (0%) Exposed Structure Necrotic Amount: Large (67-100%) Fascia Exposed: [name] [name] Necrotic Quality: Adherent Slough Fat Layer (Subcutaneous Tissue) Exposed: Yes Tendon Exposed: [name] [name] Muscle Exposed: [name] [name] Joint Exposed: [name] [name] Bone Exposed: [name] [name] Periwound Skin Texture Texture Color [name] [name] Abnormalities Noted: [name] [name] [name] [name] Abnormalities Noted: [name] [name] Callus: [name] [name] Atrophie Blanche: [name] [name] Crepitus: [name] [name] Cyanosis: [name] [name] Excoriation: [name] [name] Ecchymosis: [name] [name] Induration: [name] [name] Erythema: [name] [name] Rash: [name] [name] Hemosiderin Staining: [name] [name] Scarring: [name] [name] Mottled: [name] [name] Pallor: [name] [name] Moisture Rubor: [name] [name] [name] [name] Abnormalities Noted: [name] [name] Dry / Scaly: [name] [name] Temperature / Pain Maceration: [name] [name] Temperature: [name] [name] Abnormality Treatment Notes Wound #7 (Lower Leg) Wound Laterality: Left, Posterior, Distal Cleanser Soap and Water Discharge Instruction: May shower and wash wound with dial antibacterial soap and water prior to dressing change. Peri-Wound Care Triamcinolone 15 (g) Discharge Instruction: Use triamcinolone apply to entire both legs. around the wounds. Topical Primary Dressing Santyl Ointment Discharge Instruction:  Apply nickel thick amount to wound bed as instructed [name] [name] [name], [name] [name] [name] ([id number]) [id number].pdf Page 15 of 15 Secondary Dressing ABD Pad, 8x10 Discharge Instruction: Apply over primary dressing as directed. Secured With [employer], 4.5x3.1 (in/yd) Discharge Instruction:  Secure with Kerlix as directed. 21M Medipore H Soft Cloth Surgical T ape, 4 x 10 (in/yd) Discharge Instruction: Secure with tape as directed. Compression Wrap Compression Stockings Add-Ons Electronic Signature(s) Signed: 03/04/2023 4:41:11 PM By: [name] [name] [name] Entered By: [name] [name] [name] on 03/04/2023 09:16:26 -------------------------------------------------------------------------------- Vitals Details Patient Name: Date of Service: [name] [name], [name] [name] [name] [name]. 03/04/2023 8:45 A [name] Medical Record Number: [id number] Patient Account Number: [id number] Date of Birth/Sex: Treating RN: 07-Feb-1956 (67 y.o. [name]) Primary Care Curvin Hunger: [place] Other Clinician: Referring Chiquetta Langner: Treating Bowden Boody/Extender: [name] [name] [name] in Treatment: 10 Vital Signs Time Taken: 08:56 Temperature (F): 97.8 Height (in): 72 Pulse (bpm): 49 Weight (lbs): 245 Respiratory Rate (breaths/min): 16 Body Mass Index (BMI): 33.2 Blood Pressure (mmHg): 136/73 Reference Range: 80 - 120 mg / dl Electronic Signature(s) Signed: 03/04/2023 4:41:11 PM By: [name] [name] [name] Entered By: [name] [name] [name] on 03/04/2023 08:57:51

## 2023-03-11 ENCOUNTER — Encounter (HOSPITAL_BASED_OUTPATIENT_CLINIC_OR_DEPARTMENT_OTHER): Payer: Medicare Other | Attending: Internal Medicine | Admitting: Internal Medicine

## 2023-03-11 DIAGNOSIS — Z794 Long term (current) use of insulin: Secondary | ICD-10-CM | POA: Diagnosis not present

## 2023-03-11 DIAGNOSIS — Z833 Family history of diabetes mellitus: Secondary | ICD-10-CM | POA: Diagnosis not present

## 2023-03-11 DIAGNOSIS — I890 Lymphedema, not elsewhere classified: Secondary | ICD-10-CM | POA: Diagnosis not present

## 2023-03-11 DIAGNOSIS — K7460 Unspecified cirrhosis of liver: Secondary | ICD-10-CM | POA: Diagnosis not present

## 2023-03-11 DIAGNOSIS — L97818 Non-pressure chronic ulcer of other part of right lower leg with other specified severity: Secondary | ICD-10-CM | POA: Diagnosis not present

## 2023-03-11 DIAGNOSIS — E1122 Type 2 diabetes mellitus with diabetic chronic kidney disease: Secondary | ICD-10-CM | POA: Diagnosis not present

## 2023-03-11 DIAGNOSIS — E11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 other skin ulcer: Secondary | ICD-10-CM | POA: Diagnosis not present

## 2023-03-11 DIAGNOSIS — L97828 Non-pressure chronic ulcer of other part of left lower leg with other specified severity: Secondary | ICD-10-CM | POA: Diagnosis not present

## 2023-03-11 DIAGNOSIS — D631 Anemia in chronic kidney disease: Secondary | ICD-10-CM | POA: Diagnosis not present

## 2023-03-11 DIAGNOSIS — I129 Hypertensive chronic kidney disease with stage 1 through stage 4 chronic kidney disease, or unspecified chronic kidney disease: Secondary | ICD-10-CM | POA: Diagnosis not present

## 2023-03-11 DIAGNOSIS — E11621 Type 2 diabetes mellitus with foot ulcer: Secondary | ICD-10-CM | POA: Diagnosis present

## 2023-03-11 DIAGNOSIS — N183 Chronic kidney disease, stage 3 unspecified: Secondary | ICD-10-CM | POA: Diagnosis not present

## 2023-03-11 DIAGNOSIS — I872 Venous insufficiency (chronic) (peripheral): Secondary | ICD-10-CM | POA: Insufficient documentation

## 2023-03-11 DIAGNOSIS — I87313 Chronic venous hypertension (idiopathic) with ulcer of bilateral lower extremity: Secondary | ICD-10-CM | POA: Diagnosis not present

## 2023-03-11 NOTE — Progress Notes (Signed)
GARRETT, FRALIX ([id number]) [id number] Page 1 of 14 Visit Report for 03/11/2023 Chief Complaint Document Details Patient Name: Date of Service: [name] [name]. 03/11/2023 8:45 A M Medical Record Number: [id number] Patient Account Number: [id number] Date of Birth/Sex: Treating RN: 1956-02-21 (67 y.o. M) Primary Care Provider: Cranford Mon Other Clinician: Referring Provider: Treating Provider/Extender: Shaune Pollack in Treatment: 11 Information Obtained from: Patient Chief Complaint 12/24/2022; bilateral lower extremity wounds Electronic Signature(s) Signed: 03/11/2023 4:29:36 PM By: Geralyn Corwin DO Entered By: Geralyn Corwin on 03/11/2023 09:54:54 -------------------------------------------------------------------------------- Debridement Details Patient Name: Date of Service: [name] [name] RRIS M. 03/11/2023 8:45 A M Medical Record Number: [id number] Patient Account Number: [id number] Date of Birth/Sex: Treating RN: 08-26-1955 (67 y.o. [name] [name], [name] Primary Care Provider: Cranford Mon Other Clinician: Referring Provider: Treating Provider/Extender: Shaune Pollack in Treatment: 11 Debridement Performed for Assessment: Wound #4 Left,Distal,Medial Lower Leg Performed By: Physician Geralyn Corwin, DO Debridement Type: Debridement Severity of Tissue Pre Debridement: Fat layer exposed Level of Consciousness (Pre-procedure): Awake and Alert Pre-procedure Verification/Time Out Yes - 09:30 Taken: Start Time: 09:31 Pain Control: Lidocaine 4% T opical Solution Percent of Wound Bed Debrided: 100% T Area Debrided (cm): otal 0.42 Tissue and other material debrided: Viable, Non-Viable, Slough, Slough Level: Non-Viable Tissue Debridement Description: Selective/Open Wound Instrument: Curette Bleeding: Minimum Hemostasis Achieved: Pressure End Time: 09:35 Procedural Pain: 0 Post Procedural Pain: 0 Response to  Treatment: Procedure was tolerated well Level of Consciousness (Post- Awake and Alert procedure): Post Debridement Measurements of Total Wound Length: (cm) 0.6 Width: (cm) 0.9 Depth: (cm) 0.1 Volume: (cm) 0.042 Character of Wound/Ulcer Post Debridement: Requires Further Debridement KHALIK, HAYLEY ([id number]) [id number] Page 2 of 14 Severity of Tissue Post Debridement: Fat layer exposed Post Procedure Diagnosis Same as Pre-procedure Electronic Signature(s) Signed: 03/11/2023 10:05:23 AM By: [name] RN, BSN Signed: 03/11/2023 4:29:36 PM By: Geralyn Corwin DO Entered By: [name] on 03/11/2023 09:33:35 -------------------------------------------------------------------------------- Debridement Details Patient Name: Date of Service: [name] [name] RRIS M. 03/11/2023 8:45 A M Medical Record Number: [id number] Patient Account Number: [id number] Date of Birth/Sex: Treating RN: 1956-07-31 (67 y.o. [name] [name], [name] Primary Care Provider: Cranford Mon Other Clinician: Referring Provider: Treating Provider/Extender: Shaune Pollack in Treatment: 11 Debridement Performed for Assessment: Wound #6 Left,Proximal,Posterior Lower Leg Performed By: Physician Geralyn Corwin, DO Debridement Type: Debridement Severity of Tissue Pre Debridement: Fat layer exposed Level of Consciousness (Pre-procedure): Awake and Alert Pre-procedure Verification/Time Out Yes - 09:30 Taken: Start Time: 09:31 Pain Control: Lidocaine 4% T opical Solution Percent of Wound Bed Debrided: 100% T Area Debrided (cm): otal 1.76 Tissue and other material debrided: Viable, Non-Viable, Slough, Slough Level: Non-Viable Tissue Debridement Description: Selective/Open Wound Instrument: Curette Bleeding: Minimum Hemostasis Achieved: Pressure End Time: 09:35 Procedural Pain: 0 Post Procedural Pain: 0 Response to Treatment: Procedure was tolerated well Level of  Consciousness (Post- Awake and Alert procedure): Post Debridement Measurements of Total Wound Length: (cm) 1.6 Width: (cm) 1.4 Depth: (cm) 0.2 Volume: (cm) 0.352 Character of Wound/Ulcer Post Debridement: Requires Further Debridement Severity of Tissue Post Debridement: Fat layer exposed Post Procedure Diagnosis Same as Pre-procedure Electronic Signature(s) Signed: 03/11/2023 10:05:23 AM By: [name] RN, BSN Signed: 03/11/2023 4:29:36 PM By: Geralyn Corwin DO Entered By: [name] on 03/11/2023 09:34:05 Cay Schillings ([id number]) [id number] Page 3 of 14 -------------------------------------------------------------------------------- Debridement Details Patient Name: Date of Service: [name] 39, [place] RRIS M. 03/11/2023 8:45 A M  Medical Record Number: [id number] Patient Account Number: [id number] Date of Birth/Sex: Treating RN: 01-Nov-1955 (67 y.o. [name] [name], [name] Primary Care Provider: Cranford Mon Other Clinician: Referring Provider: Treating Provider/Extender: Shaune Pollack in Treatment: 11 Debridement Performed for Assessment: Wound #7 Left,Distal,Posterior Lower Leg Performed By: Physician Geralyn Corwin, DO Debridement Type: Debridement Severity of Tissue Pre Debridement: Fat layer exposed Level of Consciousness (Pre-procedure): Awake and Alert Pre-procedure Verification/Time Out Yes - 09:30 Taken: Start Time: 09:31 Pain Control: Lidocaine 4% T opical Solution Percent of Wound Bed Debrided: 100% T Area Debrided (cm): otal 0.14 Tissue and other material debrided: Viable, Non-Viable, Slough, Slough Level: Non-Viable Tissue Debridement Description: Selective/Open Wound Instrument: Curette Bleeding: Minimum Hemostasis Achieved: Pressure End Time: 09:35 Procedural Pain: 0 Post Procedural Pain: 0 Response to Treatment: Procedure was tolerated well Level of Consciousness (Post- Awake and Alert procedure): Post  Debridement Measurements of Total Wound Length: (cm) 0.3 Width: (cm) 0.6 Depth: (cm) 0.1 Volume: (cm) 0.014 Character of Wound/Ulcer Post Debridement: Requires Further Debridement Severity of Tissue Post Debridement: Fat layer exposed Post Procedure Diagnosis Same as Pre-procedure Electronic Signature(s) Signed: 03/11/2023 10:05:23 AM By: [name] RN, BSN Signed: 03/11/2023 4:29:36 PM By: Geralyn Corwin DO Entered By: [name] on 03/11/2023 09:34:32 -------------------------------------------------------------------------------- Debridement Details Patient Name: Date of Service: [name] [name] RRIS M. 03/11/2023 8:45 A M Medical Record Number: [id number] Patient Account Number: [id number] Date of Birth/Sex: Treating RN: August 29, 1955 (67 y.o. [name] Primary Care Provider: Cranford Mon Other Clinician: Referring Provider: Treating Provider/Extender: Shaune Pollack in Treatment: 11 Debridement Performed for Assessment: Wound #3 Right,Proximal,Lateral Lower Leg Performed By: Physician Geralyn Corwin, DO Debridement Type: Debridement Severity of Tissue Pre Debridement: Fat layer exposed Level of Consciousness (Pre-procedure): Awake and Alert Pre-procedure Verification/Time Out Yes - 09:30 Taken: Start Time: 09:31 Pain Control: Lidocaine 4% Topical Solution Percent of Wound Bed Debrided: 80% T Area Debrided (cm): otal 1.7 TYQUANN, MELUCCI ([id number]) [id number] Page 4 of 14 Tissue and other material debrided: Viable, Non-Viable, Slough, Slough Level: Non-Viable Tissue Debridement Description: Selective/Open Wound Instrument: Curette Bleeding: Minimum Hemostasis Achieved: Pressure End Time: 09:35 Procedural Pain: 0 Post Procedural Pain: 0 Response to Treatment: Procedure was tolerated well Level of Consciousness (Post- Awake and Alert procedure): Post Debridement Measurements of Total Wound Length: (cm)  1.8 Width: (cm) 1.5 Depth: (cm) 0.1 Volume: (cm) 0.212 Character of Wound/Ulcer Post Debridement: Requires Further Debridement Severity of Tissue Post Debridement: Fat layer exposed Post Procedure Diagnosis Same as Pre-procedure Electronic Signature(s) Signed: 03/11/2023 10:05:23 AM By: [name] RN, BSN Signed: 03/11/2023 4:29:36 PM By: Geralyn Corwin DO Entered By: [name] on 03/11/2023 09:35:04 -------------------------------------------------------------------------------- Debridement Details Patient Name: Date of Service: [name] [name] RRIS M. 03/11/2023 8:45 A M Medical Record Number: [id number] Patient Account Number: [id number] Date of Birth/Sex: Treating RN: 1955-11-03 (67 y.o. [name] Primary Care Provider: Cranford Mon Other Clinician: Referring Provider: Treating Provider/Extender: Shaune Pollack in Treatment: 11 Debridement Performed for Assessment: Wound #5 Right,Distal,Lateral Lower Leg Performed By: Physician Geralyn Corwin, DO Debridement Type: Debridement Severity of Tissue Pre Debridement: Fat layer exposed Level of Consciousness (Pre-procedure): Awake and Alert Pre-procedure Verification/Time Out Yes - 09:30 Taken: Start Time: 09:31 Pain Control: Lidocaine 4% T opical Solution Percent of Wound Bed Debrided: 100% T Area Debrided (cm): otal 0.57 Tissue and other material debrided: Viable, Non-Viable, Slough, Slough Level: Non-Viable Tissue Debridement Description: Selective/Open Wound Instrument: Curette Bleeding: Minimum Hemostasis Achieved:  Pressure End Time: 09:35 Procedural Pain: 0 Post Procedural Pain: 0 Response to Treatment: Procedure was tolerated well Level of Consciousness (Post- Awake and Alert procedure): Post Debridement Measurements of Total Wound Length: (cm) 0.8 Width: (cm) 0.9 Depth: (cm) 0.1 Volume: (cm) 0.057 Character of Wound/Ulcer Post Debridement: Requires Further Debridement KAW, OSCARSON ([id number]) [id number] Page 5 of 14 Severity of Tissue Post Debridement: Fat layer exposed Post Procedure Diagnosis Same as Pre-procedure Electronic Signature(s) Signed: 03/11/2023 10:05:23 AM By: [name] RN, BSN Signed: 03/11/2023 4:29:36 PM By: Geralyn Corwin DO Entered By: [name] on 03/11/2023 09:35:28 -------------------------------------------------------------------------------- HPI Details Patient Name: Date of Service: [name] [name] [name], [name] 03/11/2023 8:45 A M Medical Record Number: [id number] Patient Account Number: [id number] Date of Birth/Sex: Treating RN: 1956-06-03 (67 y.o. M) Primary Care Provider: Cranford Mon Other Clinician: Referring Provider: Treating Provider/Extender: Shaune Pollack in Treatment: 11 History of Present Illness HPI Description: 12/24/2022 [name] [name] is a 67 year old male with a past medical history of controlled type 2 diabetes on insulin, lymphedema/chronic venous insufficiency and cirrhosis that presents to the clinic for a 1 year history of nonhealing wounds to his lower extremities bilaterally. He reports the starting spontaneously. He states that he has been following with [facility] and they have been using [facility] antibiotic spray to the legs. He is not wearing compression stockings or wraps. He is also tried Medihoney and collagen in the past with little benefit. He currently denies systemic signs of infection. 5/30; patient presents for follow-up. He has been using triamcinolone cream to the periwound and Santyl to the wound beds. There has been improvement in wound healing. He denies signs of infection and has no issues or complaints today. 6/4; both legs look considerably better. He has a wound on the right lateral lower leg and the left medial lower leg. Significant hemosiderin in the right leg less so on the left. We have been using Hydrofera Blue under  compression 6/13; patient presents for follow-up. We have been using antibiotic ointment with Hydrofera Blue under compression therapy. The wounds are smaller. He has no issues or complaints today. We discussed ordering juxta lite compression garment wraps T use once his wounds heal as he has hard time putting on o compression stockings. He was agreeable with this. 6/20; patient presents for follow-up. We have been using antibiotic ointment with Hydrofera Blue under compression therapy to the lower extremities bilaterally. Wounds are smaller. He has been approved for PuraPly and was agreeable with placement today. He denies signs of infection. 6/27; patient presents for follow-up. We have been using antibiotic ointment with Hydrofera Blue under compression therapy to the left lower extremity. This wound is healed. He has his juxta lite compression wraps with him today. T the right lateral leg we have been using PuraPly under compression and that wound o is smaller today. Unfortunately he has developed skin breakdown from the Steri-Strip that was used to hold the PuraPly in place and this has created a new wound on the right leg. 7/8; patient presents for follow-up. We have been using PuraPly to the right lateral leg wound Under compression wrap. Wound is slightly smaller however original wound used for PuraPly is healed. He unfortunately developed a new wound to the left lower extremity but it looks like this was from potential scratching. He has been using his juxta lite compression here. 716; patient presents for follow-up. We have been using antibiotic ointment with Hydrofera Blue  under 3 layer compression to lower extremities bilaterally. Unfortunately he has continued to develop wounds to the left lower extremity. He currently denies signs of infection. 7/22; patient presents for follow-up. He has been using Santyl and Hydrofera Blue with triamcinolone cream under her juxta lite compression  wraps daily. No new wounds today. Current wounds are stable. Less irritation to the periwound. 7/29; patient presents for follow-up. He has been using Santyl and Hydrofera Blue with triamcinolone cream under the juxta lite compression wraps. He is having a hard time keeping the [facility] in place to the wound beds. He denies signs of infection. 8/5; patient presents for follow-up. Has been using Santyl to the wound beds and triamcinolone cream under the juxta lite compression. He has no issues or complaints today. Wounds are overall stable. Electronic Signature(s) Signed: 03/11/2023 4:29:36 PM By: Geralyn Corwin DO Entered By: Geralyn Corwin on 03/11/2023 09:56:23 Cay Schillings ([id number]) [id number] Page 6 of 14 -------------------------------------------------------------------------------- Physical Exam Details Patient Name: Date of Service: [name] [name]. 03/11/2023 8:45 A M Medical Record Number: [id number] Patient Account Number: [id number] Date of Birth/Sex: Treating RN: [birth year] (67 y.o. M) Primary Care Provider: Cranford Mon Other Clinician: Referring Provider: Treating Provider/Extender: Shaune Pollack in Treatment: 11 Constitutional respirations regular, non-labored and within target range for patient.. Cardiovascular 2+ dorsalis pedis/posterior tibialis pulses. Psychiatric pleasant and cooperative. Notes Right lower extremity: several small scattered open wound with non viable tissue. No surrounding signs of infection. Venous stasis dermatitis. Left lower extremity: 3 open wounds with granulation tissue and nonviable tissue. Again no signs of surrounding infection. Venous stasis dermatitis. Decent edema control. Electronic Signature(s) Signed: 03/11/2023 4:29:36 PM By: Geralyn Corwin DO Entered By: Geralyn Corwin on 03/11/2023  09:58:04 -------------------------------------------------------------------------------- Physician Orders Details Patient Name: Date of Service: [name] [name] [name], [name] 03/11/2023 8:45 A M Medical Record Number: [id number] Patient Account Number: [id number] Date of Birth/Sex: Treating RN: 04-02-1956 (67 y.o. [name] [name], [name] Primary Care Provider: Cranford Mon Other Clinician: Referring Provider: Treating Provider/Extender: Shaune Pollack in Treatment: 29 Verbal / Phone Orders: No Diagnosis Coding ICD-10 Coding Code Description I87.313 Chronic venous hypertension (idiopathic) with ulcer of bilateral lower extremity L97.818 Non-pressure chronic ulcer of other part of right lower leg with other specified severity L97.828 Non-pressure chronic ulcer of other part of left lower leg with other specified severity I89.0 Lymphedema, not elsewhere classified E11.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 other skin ulcer K74.69 Other cirrhosis of liver Follow-up Appointments ppointment in 1 week. - Dr. Mikey Bussing 03/18/2023 @ 0930 (already scheduled) Return A ppointment in 2 weeks. - Dr. Mikey Bussing Return A Anesthetic (In clinic) Topical Lidocaine 4% applied to wound bed Bathing/ Shower/ Hygiene May shower with protection but do not get wound dressing(s) wet. Protect dressing(s) with water repellant cover (for example, large plastic bag) or a cast cover and may then take shower. ELDRIGE, SLADER ([id number]) [id number] Page 7 of 14 Edema Control - Lymphedema / SCD / Other Elevate legs to the level of the heart or above for 30 minutes daily and/or when sitting for 3-4 times a day throughout the day. Avoid standing for long periods of time. Patient to wear own compression stockings every day. Exercise regularly Compression stocking or Garment 30-40 mm/Hg pressure to: - wear juxatlite HDs both legs. Wound Treatment Wound #3 - Lower Leg Wound Laterality: Right,  Lateral, Proximal Cleanser: Soap and Water 1 x Per Day/15 Days Discharge Instructions: May shower  and wash wound with dial antibacterial soap and water prior to dressing change. Peri-Wound Care: Triamcinolone 15 (g) 1 x Per Day/15 Days Discharge Instructions: Use triamcinolone apply to entire both legs. around the wounds. Prim Dressing: Santyl Ointment 1 x Per Day/15 Days ary Discharge Instructions: Apply nickel thick amount to wound bed as instructed Secondary Dressing: Woven Gauze Sponge, Non-Sterile 4x4 in (DME) (Generic) 1 x Per Day/15 Days Discharge Instructions: Apply over primary dressing as directed. Secured With: [employer], 4.5x3.1 (in/yd) (DME) (Generic) 1 x Per Day/15 Days Discharge Instructions: Secure with Kerlix as directed. Secured With: 57M Medipore H Soft Cloth Surgical T ape, 4 x 10 (in/yd) (DME) (Generic) 1 x Per Day/15 Days Discharge Instructions: Secure with tape as directed. Wound #4 - Lower Leg Wound Laterality: Left, Medial, Distal Cleanser: Soap and Water 1 x Per Day/30 Days Discharge Instructions: May shower and wash wound with dial antibacterial soap and water prior to dressing change. Peri-Wound Care: Triamcinolone 15 (g) 1 x Per Day/30 Days Discharge Instructions: Use triamcinolone apply to entire both legs. around the wounds. Prim Dressing: Santyl Ointment 1 x Per Day/30 Days ary Discharge Instructions: Apply nickel thick amount to wound bed as instructed Secondary Dressing: Woven Gauze Sponge, Non-Sterile 4x4 in (DME) (Generic) 1 x Per Day/30 Days Discharge Instructions: Apply over primary dressing as directed. Secured With: [employer], 4.5x3.1 (in/yd) (DME) (Generic) 1 x Per Day/30 Days Discharge Instructions: Secure with Kerlix as directed. Secured With: 57M Medipore H Soft Cloth Surgical T ape, 4 x 10 (in/yd) (DME) (Generic) 1 x Per Day/30 Days Discharge Instructions: Secure with tape as directed. Wound #5 - Lower Leg Wound Laterality:  Right, Lateral, Distal Cleanser: Soap and Water 1 x Per Day/30 Days Discharge Instructions: May shower and wash wound with dial antibacterial soap and water prior to dressing change. Peri-Wound Care: Triamcinolone 15 (g) 1 x Per Day/30 Days Discharge Instructions: Use triamcinolone apply to entire both legs. around the wounds. Prim Dressing: Santyl Ointment 1 x Per Day/30 Days ary Discharge Instructions: Apply nickel thick amount to wound bed as instructed Secondary Dressing: Woven Gauze Sponge, Non-Sterile 4x4 in (DME) (Generic) 1 x Per Day/30 Days Discharge Instructions: Apply over primary dressing as directed. Secured With: [employer], 4.5x3.1 (in/yd) (DME) (Generic) 1 x Per Day/30 Days Discharge Instructions: Secure with Kerlix as directed. Secured With: 57M Medipore H Soft Cloth Surgical T ape, 4 x 10 (in/yd) (DME) (Generic) 1 x Per Day/30 Days Discharge Instructions: Secure with tape as directed. Wound #6 - Lower Leg Wound Laterality: Left, Posterior, Proximal Cleanser: Soap and Water 1 x Per Day/30 Days Discharge Instructions: May shower and wash wound with dial antibacterial soap and water prior to dressing change. Peri-Wound Care: Triamcinolone 15 (g) 1 x Per Day/30 Days Discharge Instructions: Use triamcinolone apply to entire both legs. around the wounds. Prim Dressing: Santyl Ointment 1 x Per Day/30 Days ary Discharge Instructions: Apply nickel thick amount to wound bed as instructed Secondary Dressing: Woven Gauze Sponge, Non-Sterile 4x4 in (DME) (Generic) 1 x Per Day/30 Days JEFFORY, [name] ([id number]) [id number] Page 8 of 14 Discharge Instructions: Apply over primary dressing as directed. Secured With: [employer], 4.5x3.1 (in/yd) (DME) (Generic) 1 x Per Day/30 Days Discharge Instructions: Secure with Kerlix as directed. Secured With: 57M Medipore H Soft Cloth Surgical T ape, 4 x 10 (in/yd) (DME) (Generic) 1 x Per Day/30  Days Discharge Instructions: Secure with tape as directed. Wound #7 - Lower Leg Wound Laterality: Left,  Posterior, Distal Cleanser: Soap and Water 1 x Per Day/30 Days Discharge Instructions: May shower and wash wound with dial antibacterial soap and water prior to dressing change. Peri-Wound Care: Triamcinolone 15 (g) 1 x Per Day/30 Days Discharge Instructions: Use triamcinolone apply to entire both legs. around the wounds. Prim Dressing: Santyl Ointment 1 x Per Day/30 Days ary Discharge Instructions: Apply nickel thick amount to wound bed as instructed Secondary Dressing: Woven Gauze Sponge, Non-Sterile 4x4 in (DME) (Generic) 1 x Per Day/30 Days Discharge Instructions: Apply over primary dressing as directed. Secured With: [employer], 4.5x3.1 (in/yd) (DME) (Generic) 1 x Per Day/30 Days Discharge Instructions: Secure with Kerlix as directed. Secured With: 50M Medipore H Soft Cloth Surgical T ape, 4 x 10 (in/yd) (DME) (Generic) 1 x Per Day/30 Days Discharge Instructions: Secure with tape as directed. Electronic Signature(s) Signed: 03/11/2023 4:29:36 PM By: Geralyn Corwin DO Entered By: Geralyn Corwin on 03/11/2023 09:58:14 -------------------------------------------------------------------------------- Problem List Details Patient Name: Date of Service: [name] [name] [name], [name] 03/11/2023 8:45 A M Medical Record Number: [id number] Patient Account Number: [id number] Date of Birth/Sex: Treating RN: [birth year] (67 y.o. [name] [name], [name] Primary Care Provider: Cranford Mon Other Clinician: Referring Provider: Treating Provider/Extender: Shaune Pollack in Treatment: 11 Active Problems ICD-10 Encounter Code Description Active Date MDM Diagnosis I87.313 Chronic venous hypertension (idiopathic) with ulcer of bilateral lower extremity 12/24/2022 No Yes L97.818 Non-pressure chronic ulcer of other part of right lower leg with other specified 12/24/2022 No  Yes severity L97.828 Non-pressure chronic ulcer of other part of left lower leg with other specified 12/24/2022 No Yes severity I89.0 Lymphedema, not elsewhere classified 12/24/2022 No Yes E11.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 other skin ulcer 12/24/2022 No Yes IVORY, THORMAN ([id number]) [id number] Page 9 of 14 K74.69 Other cirrhosis of liver 12/24/2022 No Yes Inactive Problems Resolved Problems Electronic Signature(s) Signed: 03/11/2023 4:29:36 PM By: Geralyn Corwin DO Entered By: Geralyn Corwin on 03/11/2023 09:47:28 -------------------------------------------------------------------------------- Progress Note Details Patient Name: Date of Service: [name] [name], [name] 03/11/2023 8:45 A M Medical Record Number: [id number] Patient Account Number: [id number] Date of Birth/Sex: Treating RN: 1955/09/20 (67 y.o. M) Primary Care Provider: Cranford Mon Other Clinician: Referring Provider: Treating Provider/Extender: Shaune Pollack in Treatment: 11 Subjective Chief Complaint Information obtained from Patient 12/24/2022; bilateral lower extremity wounds History of Present Illness (HPI) 12/24/2022 [name] is a 67 year old male with a past medical history of controlled type 2 diabetes on insulin, lymphedema/chronic venous insufficiency and cirrhosis that presents to the clinic for a 1 year history of nonhealing wounds to his lower extremities bilaterally. He reports the starting spontaneously. He states that he has been following with [facility] and they have been using [facility] antibiotic spray to the legs. He is not wearing compression stockings or wraps. He is also tried Medihoney and collagen in the past with little benefit. He currently denies systemic signs of infection. 5/30; patient presents for follow-up. He has been using triamcinolone cream to the periwound and Santyl to the wound beds. There has been improvement  in wound healing. He denies signs of infection and has no issues or complaints today. 6/4; both legs look considerably better. He has a wound on the right lateral lower leg and the left medial lower leg. Significant hemosiderin in the right leg less so on the left. We have been using Hydrofera Blue under compression 6/13; patient presents for follow-up. We have been using antibiotic  ointment with Hydrofera Blue under compression therapy. The wounds are smaller. He has no issues or complaints today. We discussed ordering juxta lite compression garment wraps T use once his wounds heal as he has hard time putting on o compression stockings. He was agreeable with this. 6/20; patient presents for follow-up. We have been using antibiotic ointment with Hydrofera Blue under compression therapy to the lower extremities bilaterally. Wounds are smaller. He has been approved for PuraPly and was agreeable with placement today. He denies signs of infection. 6/27; patient presents for follow-up. We have been using antibiotic ointment with Hydrofera Blue under compression therapy to the left lower extremity. This wound is healed. He has his juxta lite compression wraps with him today. T the right lateral leg we have been using PuraPly under compression and that wound o is smaller today. Unfortunately he has developed skin breakdown from the Steri-Strip that was used to hold the PuraPly in place and this has created a new wound on the right leg. 7/8; patient presents for follow-up. We have been using PuraPly to the right lateral leg wound Under compression wrap. Wound is slightly smaller however original wound used for PuraPly is healed. He unfortunately developed a new wound to the left lower extremity but it looks like this was from potential scratching. He has been using his juxta lite compression here. 716; patient presents for follow-up. We have been using antibiotic ointment with Hydrofera Blue under 3 layer  compression to lower extremities bilaterally. Unfortunately he has continued to develop wounds to the left lower extremity. He currently denies signs of infection. 7/22; patient presents for follow-up. He has been using Santyl and Hydrofera Blue with triamcinolone cream under her juxta lite compression wraps daily. No new wounds today. Current wounds are stable. Less irritation to the periwound. 7/29; patient presents for follow-up. He has been using Santyl and Hydrofera Blue with triamcinolone cream under the juxta lite compression wraps. He is having a hard time keeping the [facility] in place to the wound beds. He denies signs of infection. 8/5; patient presents for follow-up. Has been using Santyl to the wound beds and triamcinolone cream under the juxta lite compression. He has no issues or complaints today. Wounds are overall stable. Patient History Family History HAYDAR, MERRETT ([id number]) [id number] Page 10 of 14 Diabetes - Father, Heart Disease - Father. Social History Current every day smoker, Marital Status - Single, Alcohol Use - Never, Drug Use - No History, Caffeine Use - Never. Medical History Hematologic/Lymphatic Patient has history of Anemia, Lymphedema Cardiovascular Patient has history of Hypertension Gastrointestinal Patient has history of Cirrhosis - crpytogenic Endocrine Patient has history of Type II Diabetes - 6.2 HgbA1c Medical A Surgical History Notes nd Gastrointestinal GERD Genitourinary stage III CKD Objective Constitutional respirations regular, non-labored and within target range for patient.. Vitals Time Taken: 9:10 AM, Height: 72 in, Weight: 245 lbs, BMI: 33.2, Temperature: 98 F, Pulse: 66 bpm, Respiratory Rate: 20 breaths/min, Blood Pressure: 165/72 mmHg. Cardiovascular 2+ dorsalis pedis/posterior tibialis pulses. Psychiatric pleasant and cooperative. General Notes: Right lower extremity: several small  scattered open wound with non viable tissue. No surrounding signs of infection. Venous stasis dermatitis. Left lower extremity: 3 open wounds with granulation tissue and nonviable tissue. Again no signs of surrounding infection. Venous stasis dermatitis. Decent edema control. Integumentary (Hair, Skin) Wound #3 status is Open. Original cause of wound was Gradually Appeared. The date acquired was: 01/25/2023. The wound has been in treatment 5 weeks. The wound is  located on the Right,Proximal,Lateral Lower Leg. The wound measures 1.8cm length x 0.5cm width x 0.1cm depth; 0.707cm^2 area and 0.071cm^3 volume. There is Fat Layer (Subcutaneous Tissue) exposed. There is no tunneling or undermining noted. There is a medium amount of serosanguineous drainage noted. The wound margin is distinct with the outline attached to the wound base. There is no granulation within the wound bed. There is a large (67- 100%) amount of necrotic tissue within the wound bed including Adherent Slough. The periwound skin appearance did not exhibit: Callus, Crepitus, Excoriation, Induration, Rash, Scarring, Dry/Scaly, Maceration, Atrophie Blanche, Cyanosis, Ecchymosis, Hemosiderin Staining, Mottled, Pallor, Rubor, Erythema. Periwound temperature was noted as No Abnormality. Wound #4 status is Open. Original cause of wound was Gradually Appeared. The date acquired was: 02/11/2023. The wound has been in treatment 4 weeks. The wound is located on the Left,Distal,Medial Lower Leg. The wound measures 0.6cm length x 0.9cm width x 0.1cm depth; 0.424cm^2 area and 0.042cm^3 volume. There is Fat Layer (Subcutaneous Tissue) exposed. There is no tunneling or undermining noted. There is a medium amount of serosanguineous drainage noted. The wound margin is distinct with the outline attached to the wound base. There is no granulation within the wound bed. There is a large (67-100%) amount of necrotic tissue within the wound bed including Adherent  Slough. The periwound skin appearance exhibited: Scarring, Hemosiderin Staining. The periwound skin appearance did not exhibit: Callus, Crepitus, Excoriation, Induration, Rash, Dry/Scaly, Maceration, Atrophie Blanche, Cyanosis, Ecchymosis, Mottled, Pallor, Rubor, Erythema. Periwound temperature was noted as No Abnormality. Wound #5 status is Open. Original cause of wound was Blister. The date acquired was: 02/19/2023. The wound has been in treatment 2 weeks. The wound is located on the Right,Distal,Lateral Lower Leg. The wound measures 0.8cm length x 0.9cm width x 0.1cm depth; 0.565cm^2 area and 0.057cm^3 volume. There is Fat Layer (Subcutaneous Tissue) exposed. There is no tunneling or undermining noted. There is a medium amount of serosanguineous drainage noted. The wound margin is distinct with the outline attached to the wound base. There is no granulation within the wound bed. There is a large (67-100%) amount of necrotic tissue within the wound bed including Adherent Slough. The periwound skin appearance did not exhibit: Callus, Crepitus, Excoriation, Induration, Rash, Scarring, Dry/Scaly, Maceration, Atrophie Blanche, Cyanosis, Ecchymosis, Hemosiderin Staining, Mottled, Pallor, Rubor, Erythema. Periwound temperature was noted as No Abnormality. Wound #6 status is Open. Original cause of wound was Blister. The date acquired was: 02/19/2023. The wound has been in treatment 2 weeks. The wound is located on the Left,Proximal,Posterior Lower Leg. The wound measures 1.6cm length x 1.4cm width x 0.2cm depth; 1.759cm^2 area and 0.352cm^3 volume. There is Fat Layer (Subcutaneous Tissue) exposed. There is no tunneling or undermining noted. There is a medium amount of serosanguineous drainage noted. The wound margin is distinct with the outline attached to the wound base. There is no granulation within the wound bed. There is a large (67-100%) amount of necrotic tissue within the wound bed including Adherent  Slough. The periwound skin appearance did not exhibit: Callus, Crepitus, Excoriation, Induration, Rash, Scarring, Dry/Scaly, Maceration, Atrophie Blanche, Cyanosis, Ecchymosis, Hemosiderin Staining, Mottled, Pallor, Rubor, Erythema. Periwound temperature was noted as No Abnormality. Wound #7 status is Open. Original cause of wound was Blister. The date acquired was: 02/19/2023. The wound has been in treatment 2 weeks. The wound is located on the Left,Distal,Posterior Lower Leg. The wound measures 0.3cm length x 0.6cm width x 0.1cm depth; 0.141cm^2 area and 0.014cm^3 volume. There is Fat Layer (Subcutaneous  Tissue) exposed. There is no tunneling or undermining noted. There is a medium amount of serosanguineous drainage noted. The wound margin is distinct with the outline attached to the wound base. There is no granulation within the wound bed. There is a large (67-100%) amount of necrotic tissue within the wound bed including Adherent Slough. The periwound skin appearance did not exhibit: Callus, Crepitus, Excoriation, Induration, Rash, Scarring, Dry/Scaly, Maceration, Atrophie Blanche, Cyanosis, Ecchymosis, Hemosiderin Staining, Mottled, Pallor, Rubor, Erythema. Periwound temperature was noted as No Abnormality. 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 I debrided nonviable tissue. I recommended continuing Santyl daily and using his juxta light compression. Can use triamcinolone cream to the areas of irritation. He does admit that  he sits for long periods of time without elevating his legs. I recommended he elevate his legs. He may need to go back to compression wraps. Will also consider at next visit getting venous reflux studies. Procedures Wound #3 Pre-procedure diagnosis of Wound #3 is a Diabetic Wound/Ulcer of the Lower Extremity located on the Right,Proximal,Lateral Lower Leg .Severity of Tissue Pre Debridement is: Fat layer exposed. There was a Selective/Open Wound Non-Viable Tissue Debridement with a total area of 1.7 sq cm performed by Geralyn Corwin, DO. With the following instrument(s): Curette to remove Viable and Non-Viable tissue/material. Material removed includes Slough after achieving pain control using Lidocaine 4% Topical Solution. A time out was conducted at 09:30, prior to the start of the procedure. A Minimum amount of bleeding was controlled with Pressure. The procedure was tolerated well with a pain level of 0 throughout and a pain level of 0 following the procedure. Post Debridement Measurements: 1.8cm length x 1.5cm width x 0.1cm depth; 0.212cm^3 volume. Character of Wound/Ulcer Post Debridement requires further debridement. Severity of Tissue Post Debridement is: Fat layer exposed. Post procedure Diagnosis Wound #3: Same as Pre-Procedure Wound #4 Pre-procedure diagnosis of Wound #4 is a Diabetic Wound/Ulcer of the Lower Extremity located on the Left,Distal,Medial Lower Leg .Severity of Tissue Pre Debridement is: Fat layer exposed. There was a Selective/Open Wound Non-Viable Tissue Debridement with a total area of 0.42 sq cm performed by Geralyn Corwin, DO. With the following instrument(s): Curette to remove Viable and Non-Viable tissue/material. Material removed includes Slough after achieving pain control using Lidocaine 4% Topical Solution. A time out was conducted at 09:30, prior to the start of the procedure. A Minimum amount of bleeding was controlled with Pressure. The procedure was  tolerated well with a pain level of 0 throughout and a pain level of 0 following the procedure. Post Debridement Measurements: 0.6cm length x 0.9cm width x 0.1cm depth; 0.042cm^3 volume. Character of Wound/Ulcer Post Debridement requires further debridement. Severity of Tissue Post Debridement is: Fat layer exposed. Post procedure Diagnosis Wound #4: Same as Pre-Procedure Wound #5 Pre-procedure diagnosis of Wound #5 is a Diabetic Wound/Ulcer of the Lower Extremity located on the Right,Distal,Lateral Lower Leg .Severity of Tissue Pre Debridement is: Fat layer exposed. There was a Selective/Open Wound Non-Viable Tissue Debridement with a total area of 0.57 sq cm performed by Geralyn Corwin, DO. With the following instrument(s): Curette to remove Viable and Non-Viable  tissue/material. Material removed includes Slough after achieving pain control using Lidocaine 4% Topical Solution. A time out was conducted at 09:30, prior to the start of the procedure. A Minimum amount of bleeding was controlled with Pressure. The procedure was tolerated well with a pain level of 0 throughout and a pain level of 0 following the procedure. Post Debridement Measurements: 0.8cm length x 0.9cm width x 0.1cm depth; 0.057cm^3 volume. Character of Wound/Ulcer Post Debridement requires further debridement. Severity of Tissue Post Debridement is: Fat layer exposed. Post procedure Diagnosis Wound #5: Same as Pre-Procedure Wound #6 Pre-procedure diagnosis of Wound #6 is a Diabetic Wound/Ulcer of the Lower Extremity located on the Left,Proximal,Posterior Lower Leg .Severity of Tissue Pre Debridement is: Fat layer exposed. There was a Selective/Open Wound Non-Viable Tissue Debridement with a total area of 1.76 sq cm performed by Geralyn Corwin, DO. With the following instrument(s): Curette to remove Viable and Non-Viable tissue/material. Material removed includes Slough after achieving pain control using Lidocaine 4% Topical  Solution. A time out was conducted at 09:30, prior to the start of the procedure. A Minimum amount of bleeding was controlled with Pressure. The procedure was tolerated well with a pain level of 0 throughout and a pain level of 0 following the procedure. Post Debridement Measurements: 1.6cm length x 1.4cm width x 0.2cm depth; 0.352cm^3 volume. Character of Wound/Ulcer Post Debridement requires further debridement. Severity of Tissue Post Debridement is: Fat layer exposed. Post procedure Diagnosis Wound #6: Same as Pre-Procedure Wound #7 Pre-procedure diagnosis of Wound #7 is a Diabetic Wound/Ulcer of the Lower Extremity located on the Left,Distal,Posterior Lower Leg .Severity of Tissue Pre Debridement is: Fat layer exposed. There was a Selective/Open Wound Non-Viable Tissue Debridement with a total area of 0.14 sq cm performed by Geralyn Corwin, DO. With the following instrument(s): Curette to remove Viable and Non-Viable tissue/material. Material removed includes Slough after achieving pain control using Lidocaine 4% Topical Solution. A time out was conducted at 09:30, prior to the start of the procedure. A Minimum amount of bleeding was controlled with Pressure. The procedure was tolerated well with a pain level of 0 throughout and a pain level of 0 following the procedure. Post Debridement Measurements: 0.3cm length x 0.6cm width x 0.1cm depth; 0.014cm^3 volume. Character of Wound/Ulcer Post Debridement requires further debridement. Severity of Tissue Post Debridement is: Fat layer exposed. Post procedure Diagnosis Wound #7: Same as Pre-Procedure Plan ELISIO, ARKO ([id number]) [id number] Page 12 of 14 Follow-up Appointments: Return Appointment in 1 week. - Dr. Mikey Bussing 03/18/2023 @ 0930 (already scheduled) Return Appointment in 2 weeks. - Dr. Mikey Bussing Anesthetic: (In clinic) Topical Lidocaine 4% applied to wound bed Bathing/ Shower/ Hygiene: May shower with  protection but do not get wound dressing(s) wet. Protect dressing(s) with water repellant cover (for example, large plastic bag) or a cast cover and may then take shower. Edema Control - Lymphedema / SCD / Other: Elevate legs to the level of the heart or above for 30 minutes daily and/or when sitting for 3-4 times a day throughout the day. Avoid standing for long periods of time. Patient to wear own compression stockings every day. Exercise regularly Compression stocking or Garment 30-40 mm/Hg pressure to: - wear juxatlite HDs both legs. WOUND #3: - Lower Leg Wound Laterality: Right, Lateral, Proximal Cleanser: Soap and Water 1 x Per Day/15 Days Discharge Instructions: May shower and wash wound with dial antibacterial soap and water prior to dressing change. Peri-Wound Care: Triamcinolone 15 (g) 1 x Per Day/15 Days Discharge  Instructions: Use triamcinolone apply to entire both legs. around the wounds. Prim Dressing: Santyl Ointment 1 x Per Day/15 Days ary Discharge Instructions: Apply nickel thick amount to wound bed as instructed Secondary Dressing: Woven Gauze Sponge, Non-Sterile 4x4 in (DME) (Generic) 1 x Per Day/15 Days Discharge Instructions: Apply over primary dressing as directed. Secured With: [employer], 4.5x3.1 (in/yd) (DME) (Generic) 1 x Per Day/15 Days Discharge Instructions: Secure with Kerlix as directed. Secured With: 55M Medipore H Soft Cloth Surgical T ape, 4 x 10 (in/yd) (DME) (Generic) 1 x Per Day/15 Days Discharge Instructions: Secure with tape as directed. WOUND #4: - Lower Leg Wound Laterality: Left, Medial, Distal Cleanser: Soap and Water 1 x Per Day/30 Days Discharge Instructions: May shower and wash wound with dial antibacterial soap and water prior to dressing change. Peri-Wound Care: Triamcinolone 15 (g) 1 x Per Day/30 Days Discharge Instructions: Use triamcinolone apply to entire both legs. around the wounds. Prim Dressing: Santyl Ointment 1 x Per Day/30  Days ary Discharge Instructions: Apply nickel thick amount to wound bed as instructed Secondary Dressing: Woven Gauze Sponge, Non-Sterile 4x4 in (DME) (Generic) 1 x Per Day/30 Days Discharge Instructions: Apply over primary dressing as directed. Secured With: [employer], 4.5x3.1 (in/yd) (DME) (Generic) 1 x Per Day/30 Days Discharge Instructions: Secure with Kerlix as directed. Secured With: 55M Medipore H Soft Cloth Surgical T ape, 4 x 10 (in/yd) (DME) (Generic) 1 x Per Day/30 Days Discharge Instructions: Secure with tape as directed. WOUND #5: - Lower Leg Wound Laterality: Right, Lateral, Distal Cleanser: Soap and Water 1 x Per Day/30 Days Discharge Instructions: May shower and wash wound with dial antibacterial soap and water prior to dressing change. Peri-Wound Care: Triamcinolone 15 (g) 1 x Per Day/30 Days Discharge Instructions: Use triamcinolone apply to entire both legs. around the wounds. Prim Dressing: Santyl Ointment 1 x Per Day/30 Days ary Discharge Instructions: Apply nickel thick amount to wound bed as instructed Secondary Dressing: Woven Gauze Sponge, Non-Sterile 4x4 in (DME) (Generic) 1 x Per Day/30 Days Discharge Instructions: Apply over primary dressing as directed. Secured With: [employer], 4.5x3.1 (in/yd) (DME) (Generic) 1 x Per Day/30 Days Discharge Instructions: Secure with Kerlix as directed. Secured With: 55M Medipore H Soft Cloth Surgical T ape, 4 x 10 (in/yd) (DME) (Generic) 1 x Per Day/30 Days Discharge Instructions: Secure with tape as directed. WOUND #6: - Lower Leg Wound Laterality: Left, Posterior, Proximal Cleanser: Soap and Water 1 x Per Day/30 Days Discharge Instructions: May shower and wash wound with dial antibacterial soap and water prior to dressing change. Peri-Wound Care: Triamcinolone 15 (g) 1 x Per Day/30 Days Discharge Instructions: Use triamcinolone apply to entire both legs. around the wounds. Prim Dressing: Santyl Ointment 1 x  Per Day/30 Days ary Discharge Instructions: Apply nickel thick amount to wound bed as instructed Secondary Dressing: Woven Gauze Sponge, Non-Sterile 4x4 in (DME) (Generic) 1 x Per Day/30 Days Discharge Instructions: Apply over primary dressing as directed. Secured With: [employer], 4.5x3.1 (in/yd) (DME) (Generic) 1 x Per Day/30 Days Discharge Instructions: Secure with Kerlix as directed. Secured With: 55M Medipore H Soft Cloth Surgical T ape, 4 x 10 (in/yd) (DME) (Generic) 1 x Per Day/30 Days Discharge Instructions: Secure with tape as directed. WOUND #7: - Lower Leg Wound Laterality: Left, Posterior, Distal Cleanser: Soap and Water 1 x Per Day/30 Days Discharge Instructions: May shower and wash wound with dial antibacterial soap and water prior to dressing change. Peri-Wound Care: Triamcinolone 15 (g)  1 x Per Day/30 Days Discharge Instructions: Use triamcinolone apply to entire both legs. around the wounds. Prim Dressing: Santyl Ointment 1 x Per Day/30 Days ary Discharge Instructions: Apply nickel thick amount to wound bed as instructed Secondary Dressing: Woven Gauze Sponge, Non-Sterile 4x4 in (DME) (Generic) 1 x Per Day/30 Days Discharge Instructions: Apply over primary dressing as directed. Secured With: [employer], 4.5x3.1 (in/yd) (DME) (Generic) 1 x Per Day/30 Days Discharge Instructions: Secure with Kerlix as directed. Secured With: 69M Medipore H Soft Cloth Surgical T ape, 4 x 10 (in/yd) (DME) (Generic) 1 x Per Day/30 Days Discharge Instructions: Secure with tape as directed. 1. In office sharp debridement 2. Santyl 3. Juxta lite compression wraps daily 4. Follow-up in 1 week Electronic Signature(s) SHAWNDRE, PEEL ([id number]) [id number] Page 13 of 14 Signed: 03/11/2023 4:29:36 PM By: Geralyn Corwin DO Entered By: Geralyn Corwin on 03/11/2023  10:02:15 -------------------------------------------------------------------------------- HxROS Details Patient Name: Date of Service: [name] [name], [name] 03/11/2023 8:45 A M Medical Record Number: [id number] Patient Account Number: [id number] Date of Birth/Sex: Treating RN: [birth year] (67 y.o. M) Primary Care Provider: Cranford Mon Other Clinician: Referring Provider: Treating Provider/Extender: Shaune Pollack in Treatment: 11 Hematologic/Lymphatic Medical History: Positive for: Anemia; Lymphedema Cardiovascular Medical History: Positive for: Hypertension Gastrointestinal Medical History: Positive for: Cirrhosis - crpytogenic Past Medical History Notes: GERD Endocrine Medical History: Positive for: Type II Diabetes - 6.2 HgbA1c Time with diabetes: 20 yeatrs Treated with: Insulin Genitourinary Medical History: Past Medical History Notes: stage III CKD Immunizations Pneumococcal Vaccine: Received Pneumococcal Vaccination: No Implantable Devices No devices added Family and Social History Diabetes: Yes - Father; Heart Disease: Yes - Father; Current every day smoker; Marital Status - Single; Alcohol Use: Never; Drug Use: No History; Caffeine Use: Never; Financial Concerns: No; Food, Clothing or Shelter Needs: No; Support System Lacking: No; Transportation Concerns: No Electronic Signature(s) Signed: 03/11/2023 4:29:36 PM By: Geralyn Corwin DO Entered By: Geralyn Corwin on 03/11/2023 09:56:38 Cay Schillings ([id number]) [id number] Page 14 of 14 -------------------------------------------------------------------------------- SuperBill Details Patient Name: Date of Service: [name] [name] 03/11/2023 Medical Record Number: [id number] Patient Account Number: [id number] Date of Birth/Sex: Treating RN: 1956-03-25 (67 y.o. 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 Facility Procedures : CPT4 Code: [id number] Description: 97597 - DEBRIDE WOUND 1ST 20 SQ CM OR < ICD-10 Diagnosis Description 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 with other  specified se 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 Modifier: everity verity Quantity: 1 Physician Procedures : CPT4 Code Description Modifier [id number] - WC PHYS DEBR WO ANESTH 20 SQ CM ICD-10 Diagnosis Description L97.818 Non-pressure chronic ulcer of other part of right lower leg with other specified severity L97.828 Non-pressure chronic ulcer of other  part of left lower leg with other specified severity I87.313  Chronic venous hypertension (idiopathic) with ulcer of bilateral lower extremity E11.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 other skin ulcer Quantity: 1 Electronic Signature(s) Signed: 03/11/2023 4:29:36 PM By: Geralyn Corwin DO Entered By: Geralyn Corwin on 03/11/2023 10:03:14

## 2023-03-11 NOTE — Progress Notes (Signed)
FILIBERTO, KISTER ([id number]) [id number] Page 1 of 17 Visit Report for 03/11/2023 Arrival Information Details Patient Name: Date of Service: [name]. 03/11/2023 8:45 A M Medical Record Number: [id number] Patient Account Number: [id number] Date of Birth/Sex: Treating RN: 10-Jan-1956 (67 y.o. [name], [name] Primary Care : [facility] Other Clinician: Referring : Treating /Extender: Shaune Pollack in Treatment: 11 Visit Information History Since Last Visit Added or deleted any medications: No Patient Arrived: Ambulatory Any new allergies or adverse reactions: No Arrival Time: 08:55 Had a fall or experienced change in No Accompanied By: sister activities of daily living that may affect Transfer Assistance: None risk of falls: Patient Identification Verified: Yes Signs or symptoms of abuse/neglect since last visito No Secondary Verification Process Completed: Yes Hospitalized since last visit: No Patient Requires Transmission-Based No Implantable device outside of the clinic excluding No Precautions: cellular tissue based products placed in the center Patient Has Alerts: Yes since last visit: Patient Alerts: 10/23 ABI L0.94 R1 VVS Has Dressing in Place as Prescribed: Yes 10/23 TBI L1.2 R0.88 VVS Has Compression in Place as Prescribed: Yes Pain Present Now: No Notes patient had juxtalite HD compression of legs on and not the foot compression sock. Educated the patient and sister about wearing those as well. Electronic Signature(s) Signed: 03/11/2023 10:05:23 AM By: [name] RN, BSN Entered By: [name] on 03/11/2023 09:10:42 -------------------------------------------------------------------------------- Encounter Discharge Information Details Patient Name: Date of Service: [name], [name] 03/11/2023 8:45 A M Medical Record Number: [id number] Patient Account Number: [id number] Date of  Birth/Sex: Treating RN: 1955/10/13 (67 y.o. [name] Primary Care : [facility] Other Clinician: Referring : Treating /Extender: Shaune Pollack in Treatment: 11 Encounter Discharge Information Items Post Procedure Vitals Discharge Condition: Stable Temperature (F): 98 Ambulatory Status: Ambulatory Pulse (bpm): 66 Discharge Destination: Home Respiratory Rate (breaths/min): 20 Transportation: Private Auto Blood Pressure (mmHg): 165/72 Accompanied By: sister Schedule Follow-up Appointment: Yes Clinical Summary of Care: Electronic Signature(s) Signed: 03/11/2023 10:05:23 AM By: [name] RN, BSN Entered By: [name] on 03/11/2023 09:36:41 Cay Schillings ([id number]) [id number] Page 2 of 17 -------------------------------------------------------------------------------- Lower Extremity Assessment Details Patient Name: Date of Service: [name] 03/11/2023 8:45 A M Medical Record Number: [id number] Patient Account Number: [id number] Date of Birth/Sex: Treating RN: Jan 26, 1956 (67 y.o. [name], [name] Primary Care : [facility] Other Clinician: Referring : Treating /Extender: Shaune Pollack in Treatment: 11 Edema Assessment Assessed: [name]: Yes] [name]: Yes] Edema: [Left: No] [Right: No] Calf Left: Right: Point of Measurement: 37 cm From Medial Instep 33 cm 34 cm Ankle Left: Right: Point of Measurement: 13 cm From Medial Instep 22 cm 22.5 cm Vascular Assessment Pulses: Dorsalis Pedis Palpable: [Left:Yes] [Right:Yes] Extremity colors, hair growth, and conditions: Extremity Color: [Left:Hyperpigmented] [Right:Hyperpigmented] Hair Growth on Extremity: [Left:No] [Right:No] Temperature of Extremity: [Left:Warm] [Right:Warm] Capillary Refill: [Left:< 3 seconds] [Right:< 3 seconds] Dependent Rubor: [Left:No] [Right:No] Blanched when  Elevated: [Left:No No] [Right:No Yes] Toe Nail Assessment Left: Right: Thick: Yes Yes Discolored: Yes Yes Deformed: Yes Yes Improper Length and Hygiene: Yes Yes Electronic Signature(s) Signed: 03/11/2023 10:05:23 AM By: [name] RN, BSN Entered By: [name] on 03/11/2023 09:11:28 -------------------------------------------------------------------------------- Multi Wound Chart Details Patient Name: Date of Service: [name] 03/11/2023 8:45 A M Medical Record Number: [id number] Patient Account Number: [id number] Date of Birth/Sex: Treating RN: 08/31/1955 (67 y.o. M) Primary Care  : [facility] Other Clinician: Referring : Treating /Extender: Lanney Gins Weeks in Treatment: 11 Vital Signs Height(in): 72 Pulse(bpm): 66 Weight(lbs): 245 Blood Pressure(mmHg): 165/72 ARZA, KOETJE ([id number]) [phone].pdf Page 3 of 17 Body Mass Index(BMI): 33.2 Temperature(F): 98 Respiratory Rate(breaths/min): 20 [3:Photos:] Right, Proximal, Lateral Lower Leg Left, Distal, Medial Lower Leg Right, Distal, Lateral Lower Leg Wound Location: Gradually Appeared Gradually Appeared Blister Wounding Event: Diabetic Wound/Ulcer of the Lower Diabetic Wound/Ulcer of the Lower Diabetic Wound/Ulcer of the Lower Primary Etiology: Extremity Extremity Extremity N/A Venous Leg Ulcer N/A Secondary Etiology: Anemia, Lymphedema, Hypertension, Anemia, Lymphedema, Hypertension, Anemia, Lymphedema, Hypertension, Comorbid History: Cirrhosis , Type II Diabetes Cirrhosis , Type II Diabetes Cirrhosis , Type II Diabetes 01/25/2023 02/11/2023 02/19/2023 Date Acquired: 5 4 2  Weeks of Treatment: Open Open Open Wound Status: No No No Wound Recurrence: 3 N/A N/A Clustered Quantity: 1.8x0.5x0.1 0.6x0.9x0.1 0.8x0.9x0.1 Measurements L x W x D (cm) 0.707 0.424 0.565 A (cm) : rea 0.071 0.042 0.057 Volume (cm) : -895.80% -10.10%  11.20% % Reduction in A rea: -407.10% -10.50% 10.90% % Reduction in Volume: Grade 1 Grade 1 Grade 1 Classification: Medium Medium Medium Exudate A mount: Serosanguineous Serosanguineous Serosanguineous Exudate Type: red, brown red, brown red, brown Exudate Color: Distinct, outline attached Distinct, outline attached Distinct, outline attached Wound Margin: None Present (0%) None Present (0%) None Present (0%) Granulation A mount: Large (67-100%) Large (67-100%) Large (67-100%) Necrotic A mount: Fat Layer (Subcutaneous Tissue): Yes Fat Layer (Subcutaneous Tissue): Yes Fat Layer (Subcutaneous Tissue): Yes Exposed Structures: Fascia: No Fascia: No Fascia: No Tendon: No Tendon: No Tendon: No Muscle: No Muscle: No Muscle: No Joint: No Joint: No Joint: No Bone: No Bone: No Bone: No None None None Epithelialization: Debridement - Selective/Open Wound Debridement - Selective/Open Wound Debridement - Selective/Open Wound Debridement: Pre-procedure Verification/Time Out 09:30 09:30 09:30 Taken: Lidocaine 4% Topical Solution Lidocaine 4% Topical Solution Lidocaine 4% Topical Solution Pain Control: [employer] Tissue Debrided: Non-Viable Tissue Non-Viable Tissue Non-Viable Tissue Level: 1.7 0.42 0.57 Debridement A (sq cm): rea Curette Curette Curette Instrument: Minimum Minimum Minimum Bleeding: Pressure Pressure Pressure Hemostasis A chieved: 0 0 0 Procedural Pain: 0 0 0 Post Procedural Pain: Procedure was tolerated well Procedure was tolerated well Procedure was tolerated well Debridement Treatment Response: 1.8x1.5x0.1 0.6x0.9x0.1 0.8x0.9x0.1 Post Debridement Measurements L x W x D (cm) 0.212 0.042 0.057 Post Debridement Volume: (cm) Excoriation: No Scarring: Yes Excoriation: No Periwound Skin Texture: Induration: No Excoriation: No Induration: No Callus: No Induration: No Callus: No Crepitus: No Callus: No Crepitus: No Rash:  No Crepitus: No Rash: No Scarring: No Rash: No Scarring: No Maceration: No Maceration: No Maceration: No Periwound Skin Moisture: Dry/Scaly: No Dry/Scaly: No Dry/Scaly: No Atrophie Blanche: No Hemosiderin Staining: Yes Atrophie Blanche: No Periwound Skin Color: Cyanosis: No Atrophie Blanche: No Cyanosis: No Ecchymosis: No Cyanosis: No Ecchymosis: No Erythema: No Ecchymosis: No Erythema: No Hemosiderin Staining: No Erythema: No Hemosiderin Staining: No Mottled: No Mottled: No Mottled: No Pallor: No Pallor: No Pallor: No Rubor: No Rubor: No Rubor: No No Abnormality No Abnormality No Abnormality Temperature: Debridement Debridement Debridement Procedures Performed: Wound Number: 6 7 N/A Photos: N/A CAMDYN, BLOEMKER ([id number]) [id number] Page 4 of 17 Left, Proximal, Posterior Lower Leg Left, Distal, Posterior Lower Leg N/A Wound Location: Blister Blister N/A Wounding Event: Diabetic Wound/Ulcer of the Lower Diabetic Wound/Ulcer of the Lower N/A Primary Etiology: Extremity Extremity N/A N/A N/A Secondary Etiology: Anemia, Lymphedema, Hypertension, Anemia, Lymphedema, Hypertension, N/A Comorbid History:  Cirrhosis , Type II Diabetes Cirrhosis , Type II Diabetes 02/19/2023 02/19/2023 N/A Date Acquired: 2 2 N/A Weeks of Treatment: Open Open N/A Wound Status: No No N/A Wound Recurrence: N/A N/A N/A Clustered Quantity: 1.6x1.4x0.2 0.3x0.6x0.1 N/A Measurements L x W x D (cm) 1.759 0.141 N/A A (cm) : rea 0.352 0.014 N/A Volume (cm) : -6.70% 77.50% N/A % Reduction in A rea: -113.30% 77.80% N/A % Reduction in Volume: Grade 1 Grade 1 N/A Classification: Medium Medium N/A Exudate A mount: Serosanguineous Serosanguineous N/A Exudate Type: red, brown red, brown N/A Exudate Color: Distinct, outline attached Distinct, outline attached N/A Wound Margin: None Present (0%) None Present (0%) N/A Granulation A mount: Large  (67-100%) Large (67-100%) N/A Necrotic A mount: Fat Layer (Subcutaneous Tissue): Yes Fat Layer (Subcutaneous Tissue): Yes N/A Exposed Structures: Fascia: No Fascia: No Tendon: No Tendon: No Muscle: No Muscle: No Joint: No Joint: No Bone: No Bone: No None Small (1-33%) N/A Epithelialization: Debridement - Selective/Open Wound Debridement - Selective/Open Wound N/A Debridement: Pre-procedure Verification/Time Out 09:30 09:30 N/A Taken: Lidocaine 4% Topical Solution Lidocaine 4% Topical Solution N/A Pain Control: [employer] N/A Tissue Debrided: Non-Viable Tissue Non-Viable Tissue N/A Level: 1.76 0.14 N/A Debridement A (sq cm): rea Curette Curette N/A Instrument: Minimum Minimum N/A Bleeding: Pressure Pressure N/A Hemostasis A chieved: 0 0 N/A Procedural Pain: 0 0 N/A Post Procedural Pain: Procedure was tolerated well Procedure was tolerated well N/A Debridement Treatment Response: 1.6x1.4x0.2 0.3x0.6x0.1 N/A Post Debridement Measurements L x W x D (cm) 0.352 0.014 N/A Post Debridement Volume: (cm) Excoriation: No Excoriation: No N/A Periwound Skin Texture: Induration: No Induration: No Callus: No Callus: No Crepitus: No Crepitus: No Rash: No Rash: No Scarring: No Scarring: No Maceration: No Maceration: No N/A Periwound Skin Moisture: Dry/Scaly: No Dry/Scaly: No Atrophie Blanche: No Atrophie Blanche: No N/A Periwound Skin Color: Cyanosis: No Cyanosis: No Ecchymosis: No Ecchymosis: No Erythema: No Erythema: No Hemosiderin Staining: No Hemosiderin Staining: No Mottled: No Mottled: No Pallor: No Pallor: No Rubor: No Rubor: No No Abnormality No Abnormality N/A Temperature: Debridement Debridement N/A Procedures Performed: Treatment Notes Wound #3 (Lower Leg) Wound Laterality: Right, Lateral, Proximal Cleanser Soap and Water Discharge Instruction: May shower and wash wound with dial antibacterial soap and water prior to dressing  change. Peri-Wound Care Triamcinolone 15 (g) Discharge Instruction: Use triamcinolone apply to entire both legs. around the wounds. KOSTAS, VENEZIANO ([id number]) [id number] Page 5 of 17 Topical Primary Dressing Santyl Ointment Discharge Instruction: Apply nickel thick amount to wound bed as instructed Secondary Dressing ABD Pad, 8x10 Discharge Instruction: Apply over primary dressing as directed. Secured With [employer], 4.5x3.1 (in/yd) Discharge Instruction: Secure with Kerlix as directed. 40M Medipore H Soft Cloth Surgical T ape, 4 x 10 (in/yd) Discharge Instruction: Secure with tape as directed. Compression Wrap Compression Stockings Add-Ons Wound #4 (Lower Leg) Wound Laterality: Left, Medial, Distal Cleanser Soap and Water Discharge Instruction: May shower and wash wound with dial antibacterial soap and water prior to dressing change. Peri-Wound Care Triamcinolone 15 (g) Discharge Instruction: Use triamcinolone apply to entire both legs. around the wounds. Topical Primary Dressing Santyl Ointment Discharge Instruction: Apply nickel thick amount to wound bed as instructed Secondary Dressing ABD Pad, 8x10 Discharge Instruction: Apply over primary dressing as directed. Secured With [employer], 4.5x3.1 (in/yd) Discharge Instruction: Secure with Kerlix as directed. 40M Medipore H Soft Cloth Surgical T ape, 4 x 10 (in/yd) Discharge Instruction: Secure with tape as directed. Compression Wrap Compression Stockings Add-Ons Wound #5 (  Lower Leg) Wound Laterality: Right, Lateral, Distal Cleanser Soap and Water Discharge Instruction: May shower and wash wound with dial antibacterial soap and water prior to dressing change. Peri-Wound Care Triamcinolone 15 (g) Discharge Instruction: Use triamcinolone apply to entire both legs. around the wounds. Topical Primary Dressing Santyl Ointment Discharge Instruction: Apply nickel thick amount  to wound bed as instructed Secondary Dressing ABD Pad, 8x10 Discharge Instruction: Apply over primary dressing as directed. DARROW, EGELER ([id number]) [id number] Page 6 of 17 Secured With [employer], 4.5x3.1 (in/yd) Discharge Instruction: Secure with Kerlix as directed. 44M Medipore H Soft Cloth Surgical T ape, 4 x 10 (in/yd) Discharge Instruction: Secure with tape as directed. Compression Wrap Compression Stockings Add-Ons Wound #6 (Lower Leg) Wound Laterality: Left, Posterior, Proximal Cleanser Soap and Water Discharge Instruction: May shower and wash wound with dial antibacterial soap and water prior to dressing change. Peri-Wound Care Triamcinolone 15 (g) Discharge Instruction: Use triamcinolone apply to entire both legs. around the wounds. Topical Primary Dressing Santyl Ointment Discharge Instruction: Apply nickel thick amount to wound bed as instructed Secondary Dressing ABD Pad, 8x10 Discharge Instruction: Apply over primary dressing as directed. Secured With [employer], 4.5x3.1 (in/yd) Discharge Instruction: Secure with Kerlix as directed. 44M Medipore H Soft Cloth Surgical T ape, 4 x 10 (in/yd) Discharge Instruction: Secure with tape as directed. Compression Wrap Compression Stockings Add-Ons Wound #7 (Lower Leg) Wound Laterality: Left, Posterior, Distal Cleanser Soap and Water Discharge Instruction: May shower and wash wound with dial antibacterial soap and water prior to dressing change. Peri-Wound Care Triamcinolone 15 (g) Discharge Instruction: Use triamcinolone apply to entire both legs. around the wounds. Topical Primary Dressing Santyl Ointment Discharge Instruction: Apply nickel thick amount to wound bed as instructed Secondary Dressing ABD Pad, 8x10 Discharge Instruction: Apply over primary dressing as directed. Secured With [employer], 4.5x3.1 (in/yd) Discharge Instruction: Secure with Kerlix  as directed. 44M Medipore H Soft Cloth Surgical T ape, 4 x 10 (in/yd) Discharge Instruction: Secure with tape as directed. Compression Wrap Compression Stockings Add-Ons TYDUS, OPDYKE ([id number]) [id number] Page 7 of 17 Electronic Signature(s) Signed: 03/11/2023 4:29:36 PM By: Geralyn Corwin DO Entered By: Geralyn Corwin on 03/11/2023 09:47:35 -------------------------------------------------------------------------------- Multi-Disciplinary Care Plan Details Patient Name: Date of Service: [name], [name] RRIS M. 03/11/2023 8:45 A M Medical Record Number: [id number] Patient Account Number: [id number] Date of Birth/Sex: Treating RN: 01-16-1956 (67 y.o. [name] Primary Care : [facility] Other Clinician: Referring : Treating /Extender: Shaune Pollack in Treatment: 11 Active Inactive Pain, Acute or Chronic Nursing Diagnoses: Pain, acute or chronic: actual or potential Potential alteration in comfort, pain Goals: Patient will verbalize adequate pain control and receive pain control interventions during procedures as needed Date Initiated: 12/24/2022 Target Resolution Date: 03/08/2023 Goal Status: Active Patient/caregiver will verbalize comfort level met Date Initiated: 12/24/2022 Date Inactivated: 01/31/2023 Target Resolution Date: 02/14/2023 Goal Status: Met Interventions: Encourage patient to take pain medications as prescribed Provide education on pain management Treatment Activities: Administer pain control measures as ordered : 12/24/2022 Notes: Electronic Signature(s) Signed: 03/11/2023 10:05:23 AM By: [name] RN, BSN Entered By: [name] on 03/11/2023 09:16:02 -------------------------------------------------------------------------------- Pain Assessment Details Patient Name: Date of Service: [name] 03/11/2023 8:45 A M Medical Record Number: [id number] Patient Account  Number: [id number] Date of Birth/Sex: Treating RN: 02/16/1956 (67 y.o. [name] Primary Care : [facility] Other Clinician: Referring : Treating /Extender: Shaune Pollack  in Treatment: 11 Active Problems Location of Pain Severity and Description of Pain Patient Has Paino No JEUDY, TUONG ([id number]) [id number] Page 8 of 17 Patient Has Paino No Site Locations Pain Management and Medication Current Pain Management: Electronic Signature(s) Signed: 03/11/2023 10:05:23 AM By: [name] RN, BSN Entered By: [name] on 03/11/2023 09:10:58 -------------------------------------------------------------------------------- Patient/Caregiver Education Details Patient Name: Date of Service: [name] 8/5/2024andnbsp8:45 A M Medical Record Number: [id number] Patient Account Number: [id number] Date of Birth/Gender: Treating RN: [birth year] (67 y.o. [name] Primary Care Physician: [facility] Other Clinician: Referring Physician: Treating Physician/Extender: Shaune Pollack in Treatment: 11 Education Assessment Education Provided To: Patient Education Topics Provided Wound/Skin Impairment: Handouts: Caring for Your Ulcer Methods: Explain/Verbal Responses: Reinforcements needed Electronic Signature(s) Signed: 03/11/2023 10:05:23 AM By: [name] RN, BSN Entered By: [name] on 03/11/2023 09:16:34 -------------------------------------------------------------------------------- Wound Assessment Details Patient Name: Date of Service: [name], [name] 03/11/2023 8:45 A M Medical Record Number: [id number] Patient Account Number: [id number] IVAL, PHEGLEY ([id number]) [phone].pdf Page 9 of 17 Date of Birth/Sex: Treating RN: 07-Oct-1955 (67 y.o. [name], [name] Primary Care : Other Clinician: [facility] Referring  : Treating /Extender: Shaune Pollack in Treatment: 11 Wound Status Wound Number: 3 Primary Diabetic Wound/Ulcer of the Lower Extremity Etiology: Wound Location: Right, Proximal, Lateral Lower Leg Wound Status: Open Wounding Event: Gradually Appeared Comorbid Anemia, Lymphedema, Hypertension, Cirrhosis , Type II Date Acquired: 01/25/2023 History: Diabetes Weeks Of Treatment: 5 Clustered Wound: No Photos Wound Measurements Length: (cm) Width: (cm) Depth: (cm) Clustered Quantity: Area: (cm) Volume: (cm) 1.8 % Reduction in Area: -895.8% 0.5 % Reduction in Volume: -407.1% 0.1 Epithelialization: None 3 Tunneling: No 0.707 Undermining: No 0.071 Wound Description Classification: Grade 1 Wound Margin: Distinct, outline attached Exudate Amount: Medium Exudate Type: Serosanguineous Exudate Color: red, brown Foul Odor After Cleansing: No Slough/Fibrino Yes Wound Bed Granulation Amount: None Present (0%) Exposed Structure Necrotic Amount: Large (67-100%) Fascia Exposed: No Necrotic Quality: Adherent Slough Fat Layer (Subcutaneous Tissue) Exposed: Yes Tendon Exposed: No Muscle Exposed: No Joint Exposed: No Bone Exposed: No Periwound Skin Texture Texture Color No Abnormalities Noted: No No Abnormalities Noted: No Callus: No Atrophie Blanche: No Crepitus: No Cyanosis: No Excoriation: No Ecchymosis: No Induration: No Erythema: No Rash: No Hemosiderin Staining: No Scarring: No Mottled: No Pallor: No Moisture Rubor: No No Abnormalities Noted: No Dry / Scaly: No Temperature / Pain Maceration: No Temperature: No Abnormality Treatment Notes Wound #3 (Lower Leg) Wound Laterality: Right, Lateral, Proximal Cleanser Soap and Water Discharge Instruction: May shower and wash wound with dial antibacterial soap and water prior to dressing change. JOSES, GALLE ([id number]) [id number] Page 10 of  17 Peri-Wound Care Triamcinolone 15 (g) Discharge Instruction: Use triamcinolone apply to entire both legs. around the wounds. Topical Primary Dressing Santyl Ointment Discharge Instruction: Apply nickel thick amount to wound bed as instructed Secondary Dressing ABD Pad, 8x10 Discharge Instruction: Apply over primary dressing as directed. Secured With [employer], 4.5x3.1 (in/yd) Discharge Instruction: Secure with Kerlix as directed. 23M Medipore H Soft Cloth Surgical T ape, 4 x 10 (in/yd) Discharge Instruction: Secure with tape as directed. Compression Wrap Compression Stockings Add-Ons Electronic Signature(s) Signed: 03/11/2023 10:05:23 AM By: [name] RN, BSN Entered By: [name] on 03/11/2023 09:14:11 -------------------------------------------------------------------------------- Wound Assessment Details Patient Name: Date of Service: [name], [name] 03/11/2023 8:45 A M Medical Record Number: [id number] Patient Account Number: [id number]  Date of Birth/Sex: Treating RN: 1956-04-14 (67 y.o. [name], [name] Primary Care : [facility] Other Clinician: Referring : Treating /Extender: Shaune Pollack in Treatment: 11 Wound Status Wound Number: 4 Primary Etiology: Diabetic Wound/Ulcer of the Lower Extremity Wound Location: Left, Distal, Medial Lower Leg Secondary Venous Leg Ulcer Etiology: Wounding Event: Gradually Appeared Wound Status: Open Date Acquired: 02/11/2023 Comorbid Anemia, Lymphedema, Hypertension, Cirrhosis , Type II Weeks Of Treatment: 4 History: Diabetes Clustered Wound: No Photos Wound Measurements Length: (cm) 0.6 Width: (cm) 0.9 Depth: (cm) 0.1 Area: (cm) 0.424 Volume: (cm) 0.042 Mccanless, Brendon M ([id number]) % Reduction in Area: -10.1% % Reduction in Volume: -10.5% Epithelialization: None Tunneling: No Undermining: No [phone].pdf Page 11 of 17 Wound  Description Classification: Grade 1 Wound Margin: Distinct, outline attached Exudate Amount: Medium Exudate Type: Serosanguineous Exudate Color: red, brown Foul Odor After Cleansing: No Slough/Fibrino Yes Wound Bed Granulation Amount: None Present (0%) Exposed Structure Necrotic Amount: Large (67-100%) Fascia Exposed: No Necrotic Quality: Adherent Slough Fat Layer (Subcutaneous Tissue) Exposed: Yes Tendon Exposed: No Muscle Exposed: No Joint Exposed: No Bone Exposed: No Periwound Skin Texture Texture Color No Abnormalities Noted: No No Abnormalities Noted: No Callus: No Atrophie Blanche: No Crepitus: No Cyanosis: No Excoriation: No Ecchymosis: No Induration: No Erythema: No Rash: No Hemosiderin Staining: Yes Scarring: Yes Mottled: No Pallor: No Moisture Rubor: No No Abnormalities Noted: No Dry / Scaly: No Temperature / Pain Maceration: No Temperature: No Abnormality Treatment Notes Wound #4 (Lower Leg) Wound Laterality: Left, Medial, Distal Cleanser Soap and Water Discharge Instruction: May shower and wash wound with dial antibacterial soap and water prior to dressing change. Peri-Wound Care Triamcinolone 15 (g) Discharge Instruction: Use triamcinolone apply to entire both legs. around the wounds. Topical Primary Dressing Santyl Ointment Discharge Instruction: Apply nickel thick amount to wound bed as instructed Secondary Dressing ABD Pad, 8x10 Discharge Instruction: Apply over primary dressing as directed. Secured With [employer], 4.5x3.1 (in/yd) Discharge Instruction: Secure with Kerlix as directed. 4M Medipore H Soft Cloth Surgical T ape, 4 x 10 (in/yd) Discharge Instruction: Secure with tape as directed. Compression Wrap Compression Stockings Add-Ons Electronic Signature(s) Signed: 03/11/2023 10:05:23 AM By: [name] RN, BSN Entered By: [name] on 03/11/2023 09:15:30 Cay Schillings ([id number])  [id number] Page 12 of 17 -------------------------------------------------------------------------------- Wound Assessment Details Patient Name: Date of Service: [name] 03/11/2023 8:45 A M Medical Record Number: [id number] Patient Account Number: [id number] Date of Birth/Sex: Treating RN: Oct 02, 1955 (67 y.o. [name], [name] Primary Care : [facility] Other Clinician: Referring : Treating /Extender: Shaune Pollack in Treatment: 11 Wound Status Wound Number: 5 Primary Diabetic Wound/Ulcer of the Lower Extremity Etiology: Wound Location: Right, Distal, Lateral Lower Leg Wound Status: Open Wounding Event: Blister Comorbid Anemia, Lymphedema, Hypertension, Cirrhosis , Type II Date Acquired: 02/19/2023 History: Diabetes Weeks Of Treatment: 2 Clustered Wound: No Photos Wound Measurements Length: (cm) Width: (cm) Depth: (cm) Area: (cm) Volume: (cm) 0.8 % Reduction in Area: 11.2% 0.9 % Reduction in Volume: 10.9% 0.1 Epithelialization: None 0.565 Tunneling: No 0.057 Undermining: No Wound Description Classification: Grade 1 Wound Margin: Distinct, outline attached Exudate Amount: Medium Exudate Type: Serosanguineous Exudate Color: red, brown Foul Odor After Cleansing: No Slough/Fibrino Yes Wound Bed Granulation Amount: None Present (0%) Exposed Structure Necrotic Amount: Large (67-100%) Fascia Exposed: No Necrotic Quality: Adherent Slough Fat Layer (Subcutaneous Tissue) Exposed: Yes Tendon Exposed: No Muscle Exposed: No Joint Exposed: No Bone Exposed: No  Periwound Skin Texture Texture Color No Abnormalities Noted: No No Abnormalities Noted: No Callus: No Atrophie Blanche: No Crepitus: No Cyanosis: No Excoriation: No Ecchymosis: No Induration: No Erythema: No Rash: No Hemosiderin Staining: No Scarring: No Mottled: No Pallor: No Moisture Rubor: No No Abnormalities Noted:  No Dry / Scaly: No Temperature / Pain Maceration: No Temperature: No Abnormality RYSZARD, WARTA ([id number]) [id number] Page 13 of 17 Treatment Notes Wound #5 (Lower Leg) Wound Laterality: Right, Lateral, Distal Cleanser Soap and Water Discharge Instruction: May shower and wash wound with dial antibacterial soap and water prior to dressing change. Peri-Wound Care Triamcinolone 15 (g) Discharge Instruction: Use triamcinolone apply to entire both legs. around the wounds. Topical Primary Dressing Santyl Ointment Discharge Instruction: Apply nickel thick amount to wound bed as instructed Secondary Dressing ABD Pad, 8x10 Discharge Instruction: Apply over primary dressing as directed. Secured With [employer], 4.5x3.1 (in/yd) Discharge Instruction: Secure with Kerlix as directed. 84M Medipore H Soft Cloth Surgical T ape, 4 x 10 (in/yd) Discharge Instruction: Secure with tape as directed. Compression Wrap Compression Stockings Add-Ons Electronic Signature(s) Signed: 03/11/2023 10:05:23 AM By: [name] RN, BSN Entered By: [name] on 03/11/2023 09:13:07 -------------------------------------------------------------------------------- Wound Assessment Details Patient Name: Date of Service: [name], [name] 03/11/2023 8:45 A M Medical Record Number: [id number] Patient Account Number: [id number] Date of Birth/Sex: Treating RN: [birth year] (67 y.o. [name] Primary Care : [facility] Other Clinician: Referring : Treating /Extender: Shaune Pollack in Treatment: 11 Wound Status Wound Number: 6 Primary Diabetic Wound/Ulcer of the Lower Extremity Etiology: Wound Location: Left, Proximal, Posterior Lower Leg Wound Status: Open Wounding Event: Blister Comorbid Anemia, Lymphedema, Hypertension, Cirrhosis , Type II Date Acquired: 02/19/2023 History: Diabetes Weeks Of Treatment: 2 Clustered  Wound: No Photos ANFERNEE, OLA ([id number]) [id number] Page 14 of 17 Wound Measurements Length: (cm) 1.6 Width: (cm) 1.4 Depth: (cm) 0.2 Area: (cm) 1.759 Volume: (cm) 0.352 % Reduction in Area: -6.7% % Reduction in Volume: -113.3% Epithelialization: None Tunneling: No Undermining: No Wound Description Classification: Grade 1 Wound Margin: Distinct, outline attached Exudate Amount: Medium Exudate Type: Serosanguineous Exudate Color: red, brown Foul Odor After Cleansing: No Slough/Fibrino Yes Wound Bed Granulation Amount: None Present (0%) Exposed Structure Necrotic Amount: Large (67-100%) Fascia Exposed: No Necrotic Quality: Adherent Slough Fat Layer (Subcutaneous Tissue) Exposed: Yes Tendon Exposed: No Muscle Exposed: No Joint Exposed: No Bone Exposed: No Periwound Skin Texture Texture Color No Abnormalities Noted: No No Abnormalities Noted: No Callus: No Atrophie Blanche: No Crepitus: No Cyanosis: No Excoriation: No Ecchymosis: No Induration: No Erythema: No Rash: No Hemosiderin Staining: No Scarring: No Mottled: No Pallor: No Moisture Rubor: No No Abnormalities Noted: No Dry / Scaly: No Temperature / Pain Maceration: No Temperature: No Abnormality Treatment Notes Wound #6 (Lower Leg) Wound Laterality: Left, Posterior, Proximal Cleanser Soap and Water Discharge Instruction: May shower and wash wound with dial antibacterial soap and water prior to dressing change. Peri-Wound Care Triamcinolone 15 (g) Discharge Instruction: Use triamcinolone apply to entire both legs. around the wounds. Topical Primary Dressing Santyl Ointment Discharge Instruction: Apply nickel thick amount to wound bed as instructed Secondary Dressing ABD Pad, 8x10 Discharge Instruction: Apply over primary dressing as directed. Secured With [employer], 4.5x3.1 (in/yd) TANVEER, TAMMINGA ([id number]) [id number] Page  15 of 17 Discharge Instruction: Secure with Kerlix as directed. 84M Medipore H Soft Cloth Surgical T ape, 4 x 10 (in/yd) Discharge Instruction: Secure with tape as directed. Compression Wrap  Compression Stockings Add-Ons Electronic Signature(s) Signed: 03/11/2023 10:05:23 AM By: [name] RN, BSN Entered By: [name] on 03/11/2023 09:16:20 -------------------------------------------------------------------------------- Wound Assessment Details Patient Name: Date of Service: [name], [name] 03/11/2023 8:45 A M Medical Record Number: [id number] Patient Account Number: [id number] Date of Birth/Sex: Treating RN: November 12, 1955 (67 y.o. [name], [name] Primary Care : [facility] Other Clinician: Referring : Treating /Extender: Shaune Pollack in Treatment: 11 Wound Status Wound Number: 7 Primary Diabetic Wound/Ulcer of the Lower Extremity Etiology: Wound Location: Left, Distal, Posterior Lower Leg Wound Status: Open Wounding Event: Blister Comorbid Anemia, Lymphedema, Hypertension, Cirrhosis , Type II Date Acquired: 02/19/2023 History: Diabetes Weeks Of Treatment: 2 Clustered Wound: No Photos Wound Measurements Length: (cm) 0.3 Width: (cm) 0.6 Depth: (cm) 0.1 Area: (cm) 0.141 Volume: (cm) 0.014 % Reduction in Area: 77.5% % Reduction in Volume: 77.8% Epithelialization: Small (1-33%) Tunneling: No Undermining: No Wound Description Classification: Grade 1 Wound Margin: Distinct, outline attached Exudate Amount: Medium Exudate Type: Serosanguineous Exudate Color: red, brown Foul Odor After Cleansing: No Slough/Fibrino Yes Wound Bed Granulation Amount: None Present (0%) Exposed Structure Necrotic Amount: Large (67-100%) Fascia Exposed: No Necrotic Quality: Adherent Slough Fat Layer (Subcutaneous Tissue) Exposed: Yes Tendon Exposed: No Muscle Exposed: No Joint Exposed: No DELIA, BEDILLION ([id number])  [id number].pdf Page 16 of 17 Bone Exposed: No Periwound Skin Texture Texture Color No Abnormalities Noted: No No Abnormalities Noted: No Callus: No Atrophie Blanche: No Crepitus: No Cyanosis: No Excoriation: No Ecchymosis: No Induration: No Erythema: No Rash: No Hemosiderin Staining: No Scarring: No Mottled: No Pallor: No Moisture Rubor: No No Abnormalities Noted: No Dry / Scaly: No Temperature / Pain Maceration: No Temperature: No Abnormality Treatment Notes Wound #7 (Lower Leg) Wound Laterality: Left, Posterior, Distal Cleanser Soap and Water Discharge Instruction: May shower and wash wound with dial antibacterial soap and water prior to dressing change. Peri-Wound Care Triamcinolone 15 (g) Discharge Instruction: Use triamcinolone apply to entire both legs. around the wounds. Topical Primary Dressing Santyl Ointment Discharge Instruction: Apply nickel thick amount to wound bed as instructed Secondary Dressing ABD Pad, 8x10 Discharge Instruction: Apply over primary dressing as directed. Secured With [employer], 4.5x3.1 (in/yd) Discharge Instruction: Secure with Kerlix as directed. 37M Medipore H Soft Cloth Surgical T ape, 4 x 10 (in/yd) Discharge Instruction: Secure with tape as directed. Compression Wrap Compression Stockings Add-Ons Electronic Signature(s) Signed: 03/11/2023 10:05:23 AM By: [name] RN, BSN Entered By: [name] on 03/11/2023 09:14:54 -------------------------------------------------------------------------------- Vitals Details Patient Name: Date of Service: [name], [name] 03/11/2023 8:45 A M Medical Record Number: [id number] Patient Account Number: [id number] Date of Birth/Sex: Treating RN: [birth year] (67 y.o. [name] Primary Care : [facility] Other Clinician: Referring : Treating /Extender: Shaune Pollack in Treatment: 11 Vital  Signs Time Taken: 09:10 Temperature (F): 98 Height (in): 72 Pulse (bpm): [address] ([id number]) [phone].pdf Page 17 of 17 Weight (lbs): 245 Respiratory Rate (breaths/min): 20 Body Mass Index (BMI): 33.2 Blood Pressure (mmHg): 165/72 Reference Range: 80 - 120 mg / dl Electronic Signature(s) Signed: 03/11/2023 10:05:23 AM By: [name] RN, BSN Entered By: [name] on 03/11/2023 09:10:54

## 2023-03-18 ENCOUNTER — Encounter (HOSPITAL_BASED_OUTPATIENT_CLINIC_OR_DEPARTMENT_OTHER): Payer: Medicare Other | Admitting: Internal Medicine

## 2023-03-18 DIAGNOSIS — L97818 Non-pressure chronic ulcer of other part of right lower leg with other specified severity: Secondary | ICD-10-CM | POA: Diagnosis not present

## 2023-03-18 DIAGNOSIS — L97828 Non-pressure chronic ulcer of other part of left lower leg with other specified severity: Secondary | ICD-10-CM | POA: Diagnosis not present

## 2023-03-18 DIAGNOSIS — I87313 Chronic venous hypertension (idiopathic) with ulcer of bilateral lower extremity: Secondary | ICD-10-CM | POA: Diagnosis not present

## 2023-03-18 DIAGNOSIS — E11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 other skin ulcer: Secondary | ICD-10-CM

## 2023-03-18 NOTE — Progress Notes (Addendum)
WAI, SOLT ([id number]) [id number] Page 1 of 10 Visit Report for 03/18/2023 Chief Complaint Document Details Patient Name: Date of Service: [name] [name]. 03/18/2023 9:30 A [name] Medical Record Number: [id number] Patient Account Number: [id number] Date of Birth/Sex: Treating RN: 1955/10/24 (67 y.o. [name]) Primary Care Provider: Cranford Mon Other Clinician: Referring Provider: Treating Provider/Extender: Shaune Pollack in Treatment: 12 Information Obtained from: Patient Chief Complaint 12/24/2022; bilateral lower extremity wounds Electronic Signature(s) Signed: 03/18/2023 12:36:03 PM By: Geralyn Corwin DO Entered By: Geralyn Corwin on 03/18/2023 11:00:12 -------------------------------------------------------------------------------- HPI Details Patient Name: Date of Service: [name] [name], [name] [name]. 03/18/2023 9:30 A [name] Medical Record Number: [id number] Patient Account Number: [id number] Date of Birth/Sex: Treating RN: 1955-08-11 (67 y.o. [name]) Primary Care Provider: Cranford Mon Other Clinician: Referring Provider: Treating Provider/Extender: Shaune Pollack in Treatment: 12 History of Present Illness HPI Description: 12/24/2022 [name] is a 67 year old male with a past medical history of controlled type 2 diabetes on insulin, lymphedema/chronic venous insufficiency and cirrhosis that presents to the clinic for a 1 year history of nonhealing wounds to his lower extremities bilaterally. He reports the starting spontaneously. He states that he has been following with [facility] and they have been using [facility] antibiotic spray to the legs. He is not wearing compression stockings or wraps. He is also tried Medihoney and collagen in the past with little benefit. He currently denies systemic signs of infection. 5/30; patient presents for follow-up. He has been using triamcinolone cream to the periwound  and Santyl to the wound beds. There has been improvement in wound healing. He denies signs of infection and has [name] issues or complaints today. 6/4; both legs look considerably better. He has a wound on the right lateral lower leg and the left medial lower leg. Significant hemosiderin in the right leg less so on the left. We have been using Hydrofera Blue under compression 6/13; patient presents for follow-up. We have been using antibiotic ointment with Hydrofera Blue under compression therapy. The wounds are smaller. He has [name] issues or complaints today. We discussed ordering juxta lite compression garment wraps T use once his wounds heal as he has hard time putting on o compression stockings. He was agreeable with this. 6/20; patient presents for follow-up. We have been using antibiotic ointment with Hydrofera Blue under compression therapy to the lower extremities bilaterally. Wounds are smaller. He has been approved for PuraPly and was agreeable with placement today. He denies signs of infection. 6/27; patient presents for follow-up. We have been using antibiotic ointment with Hydrofera Blue under compression therapy to the left lower extremity. This wound is healed. He has his juxta lite compression wraps with him today. T the right lateral leg we have been using PuraPly under compression and that wound o is smaller today. Unfortunately he has developed skin breakdown from the Steri-Strip that was used to hold the PuraPly in place and this has created a new wound on the right leg. 7/8; patient presents for follow-up. We have been using PuraPly to the right lateral leg wound Under compression wrap. Wound is slightly smaller however original wound used for PuraPly is healed. He unfortunately developed a new wound to the left lower extremity but it looks like this was from potential scratching. He has been using his juxta lite compression here. 716; patient presents for follow-up. We have been  using antibiotic ointment with Hydrofera Blue under 3 layer compression  to lower extremities bilaterally. Unfortunately he has continued to develop wounds to the left lower extremity. He currently denies signs of infection. [name], [name] ([id number]) [id number] Page 2 of 10 7/22; patient presents for follow-up. He has been using Santyl and Hydrofera Blue with triamcinolone cream under her juxta lite compression wraps daily. [name] new wounds today. Current wounds are stable. Less irritation to the periwound. 7/29; patient presents for follow-up. He has been using Santyl and Hydrofera Blue with triamcinolone cream under the juxta lite compression wraps. He is having a hard time keeping the [facility] in place to the wound beds. He denies signs of infection. 8/5; patient presents for follow-up. Has been using Santyl to the wound beds and triamcinolone cream under the juxta lite compression. He has [name] issues or complaints today. Wounds are overall stable. 8/12; patient presents for follow-up. He has been using Santyl to the wound beds and triamcinolone cream to the periwound under JuxtaLite compression. He is scheduled to see dermatology this week. Wounds are overall stable. Electronic Signature(s) Signed: 03/18/2023 12:36:03 PM By: Geralyn Corwin DO Entered By: Geralyn Corwin on 03/18/2023 11:01:42 -------------------------------------------------------------------------------- Physical Exam Details Patient Name: Date of Service: [name] [name], [name] [name]. 03/18/2023 9:30 A [name] Medical Record Number: [id number] Patient Account Number: [id number] Date of Birth/Sex: Treating RN: 1956/06/17 (67 y.o. [name]) Primary Care Provider: Cranford Mon Other Clinician: Referring Provider: Treating Provider/Extender: Shaune Pollack in Treatment: 12 Constitutional respirations regular, non-labored and within target range for patient.. Cardiovascular 2+ dorsalis  pedis/posterior tibialis pulses. Psychiatric pleasant and cooperative. Notes Right lower extremity: several small scattered open wound with non viable tissue. [name] surrounding signs of infection. Venous stasis dermatitis. Left lower extremity: 3 open wounds with granulation tissue and nonviable tissue. Again [name] signs of surrounding infection. Venous stasis dermatitis. Decent edema control. Electronic Signature(s) Signed: 03/18/2023 12:36:03 PM By: Geralyn Corwin DO Entered By: Geralyn Corwin on 03/18/2023 11:02:23 -------------------------------------------------------------------------------- Physician Orders Details Patient Name: Date of Service: [name] [name], [name] [name]. 03/18/2023 9:30 A [name] Medical Record Number: [id number] Patient Account Number: [id number] Date of Birth/Sex: Treating RN: July 14, 1956 (67 y.o. Dianna Limbo Primary Care Provider: Cranford Mon Other Clinician: Referring Provider: Treating Provider/Extender: Shaune Pollack in Treatment: 12 Verbal / Phone Orders: [name] Diagnosis Coding ICD-10 Coding Code Description I87.313 Chronic venous hypertension (idiopathic) with ulcer of bilateral lower extremity L97.818 Non-pressure chronic ulcer of other part of right lower leg with other specified severity REVANTH, TRABUCCO ([id number]) [id number] Page 3 of 10 [phone] Non-pressure chronic ulcer of other part of left lower leg with other specified severity I89.0 Lymphedema, not elsewhere classified E11.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 other skin ulcer K74.69 Other cirrhosis of liver Follow-up Appointments ppointment in 1 week. - Dr. Mikey Bussing Rm 8 or 9 Monday 03/25/23 at 9:30am (already scheduled) Return A ppointment in 2 weeks. - Dr. Mikey Bussing Room 7 Tuesday 04/02/23 at 8:15am Return A Other: - Upcoming Dermatology appointment o Anesthetic (In clinic) Topical Lidocaine 4% applied to wound bed Bathing/ Shower/ Hygiene May shower  with protection but do not get wound dressing(s) wet. Protect dressing(s) with water repellant cover (for example, large plastic bag) or a cast cover and may then take shower. Edema Control - Lymphedema / SCD / Other Elevate legs to the level of the heart or above for 30 minutes daily and/or when sitting for 3-4 times a day throughout the day. Avoid standing for long periods of time. Patient to  wear own compression stockings every day. Exercise regularly Compression stocking or Garment 30-40 mm/Hg pressure to: - wear juxatlite HDs both legs. Wound Treatment Wound #3 - Lower Leg Wound Laterality: Right, Lateral, Proximal Cleanser: Soap and Water 1 x Per Day/15 Days Discharge Instructions: May shower and wash wound with dial antibacterial soap and water prior to dressing change. Peri-Wound Care: Triamcinolone 15 (g) 1 x Per Day/15 Days Discharge Instructions: Use triamcinolone apply to entire both legs. around the wounds. Prim Dressing: PolyMem Silver Non-Adhesive Dressing, 4.25x4.25 in 1 x Per Day/15 Days ary Discharge Instructions: Apply to wound bed as instructed Prim Dressing: Santyl Ointment 1 x Per Day/15 Days ary Discharge Instructions: Apply nickel thick amount to wound bed as instructed Secondary Dressing: Woven Gauze Sponge, Non-Sterile 4x4 in (Generic) 1 x Per Day/15 Days Discharge Instructions: Apply over primary dressing as directed. Secured With: [employer], 4.5x3.1 (in/yd) (Generic) 1 x Per Day/15 Days Discharge Instructions: Secure with Kerlix as directed. Secured With: 51M Medipore H Soft Cloth Surgical T ape, 4 x 10 (in/yd) (Generic) 1 x Per Day/15 Days Discharge Instructions: Secure with tape as directed. Wound #4 - Lower Leg Wound Laterality: Left, Medial, Distal Cleanser: Soap and Water 1 x Per Day/15 Days Discharge Instructions: May shower and wash wound with dial antibacterial soap and water prior to dressing change. Peri-Wound Care: Triamcinolone 15 (g) 1 x  Per Day/15 Days Discharge Instructions: Use triamcinolone apply to entire both legs. around the wounds. Prim Dressing: PolyMem Silver Non-Adhesive Dressing, 4.25x4.25 in 1 x Per Day/15 Days ary Discharge Instructions: Apply to wound bed as instructed Prim Dressing: Santyl Ointment 1 x Per Day/15 Days ary Discharge Instructions: Apply nickel thick amount to wound bed as instructed Secondary Dressing: Woven Gauze Sponge, Non-Sterile 4x4 in (Generic) 1 x Per Day/15 Days Discharge Instructions: Apply over primary dressing as directed. Secured With: [employer], 4.5x3.1 (in/yd) (Generic) 1 x Per Day/15 Days Discharge Instructions: Secure with Kerlix as directed. Secured With: 51M Medipore H Soft Cloth Surgical T ape, 4 x 10 (in/yd) (Generic) 1 x Per Day/15 Days Discharge Instructions: Secure with tape as directed. Wound #5 - Lower Leg Wound Laterality: Right, Lateral, Distal Cleanser: Soap and Water 1 x Per Day/15 Days Discharge Instructions: May shower and wash wound with dial antibacterial soap and water prior to dressing change. Peri-Wound Care: Triamcinolone 15 (g) 1 x Per Day/15 Days Discharge Instructions: Use triamcinolone apply to entire both legs. around the wounds. Prim Dressing: PolyMem Silver Non-Adhesive Dressing, 4.25x4.25 in ary 1 x Per Day/15 Days [name] [name], [name] [name] ([id number]) [id number] Page 4 of 10 Discharge Instructions: Apply to wound bed as instructed Prim Dressing: Santyl Ointment 1 x Per Day/15 Days ary Discharge Instructions: Apply nickel thick amount to wound bed as instructed Secondary Dressing: Woven Gauze Sponge, Non-Sterile 4x4 in (Generic) 1 x Per Day/15 Days Discharge Instructions: Apply over primary dressing as directed. Secured With: [employer], 4.5x3.1 (in/yd) (Generic) 1 x Per Day/15 Days Discharge Instructions: Secure with Kerlix as directed. Secured With: 51M Medipore H Soft Cloth Surgical T ape, 4 x 10 (in/yd)  (Generic) 1 x Per Day/15 Days Discharge Instructions: Secure with tape as directed. Wound #6 - Lower Leg Wound Laterality: Left, Posterior, Proximal Cleanser: Soap and Water 1 x Per Day/15 Days Discharge Instructions: May shower and wash wound with dial antibacterial soap and water prior to dressing change. Peri-Wound Care: Triamcinolone 15 (g) 1 x Per Day/15 Days Discharge Instructions: Use triamcinolone apply to entire both legs.  around the wounds. Prim Dressing: PolyMem Silver Non-Adhesive Dressing, 4.25x4.25 in 1 x Per Day/15 Days ary Discharge Instructions: Apply to wound bed as instructed Prim Dressing: Santyl Ointment 1 x Per Day/15 Days ary Discharge Instructions: Apply nickel thick amount to wound bed as instructed Secondary Dressing: Woven Gauze Sponge, Non-Sterile 4x4 in (Generic) 1 x Per Day/15 Days Discharge Instructions: Apply over primary dressing as directed. Secured With: [employer], 4.5x3.1 (in/yd) (Generic) 1 x Per Day/15 Days Discharge Instructions: Secure with Kerlix as directed. Secured With: 4M Medipore H Soft Cloth Surgical T ape, 4 x 10 (in/yd) (Generic) 1 x Per Day/15 Days Discharge Instructions: Secure with tape as directed. Wound #7 - Lower Leg Wound Laterality: Left, Posterior, Distal Cleanser: Soap and Water 1 x Per Day/15 Days Discharge Instructions: May shower and wash wound with dial antibacterial soap and water prior to dressing change. Peri-Wound Care: Triamcinolone 15 (g) 1 x Per Day/15 Days Discharge Instructions: Use triamcinolone apply to entire both legs. around the wounds. Prim Dressing: PolyMem Silver Non-Adhesive Dressing, 4.25x4.25 in 1 x Per Day/15 Days ary Discharge Instructions: Apply to wound bed as instructed Prim Dressing: Santyl Ointment 1 x Per Day/15 Days ary Discharge Instructions: Apply nickel thick amount to wound bed as instructed Secondary Dressing: Woven Gauze Sponge, Non-Sterile 4x4 in (Generic) 1 x Per Day/15  Days Discharge Instructions: Apply over primary dressing as directed. Secured With: [employer], 4.5x3.1 (in/yd) (Generic) 1 x Per Day/15 Days Discharge Instructions: Secure with Kerlix as directed. Secured With: 4M Medipore H Soft Cloth Surgical T ape, 4 x 10 (in/yd) (Generic) 1 x Per Day/15 Days Discharge Instructions: Secure with tape as directed. Consults Vascular - Venous Reflux Studies. Venous Insufficiency with lower leg edema and non-healing wounds. ICD: I87.313 - (ICD10 I87.313 - Chronic venous hypertension (idiopathic) with ulcer of bilateral lower extremity) Electronic Signature(s) Signed: 03/18/2023 12:36:03 PM By: Geralyn Corwin DO Signed: 03/18/2023 2:18:39 PM By: Karie Schwalbe RN Entered By: Karie Schwalbe on 03/18/2023 11:07:01 CUINN, [name] [name] ([id number]) [id number] Page 5 of 10 -------------------------------------------------------------------------------- Problem List Details Patient Name: Date of Service: [name] [name]. 03/18/2023 9:30 A [name] Medical Record Number: [id number] Patient Account Number: [id number] Date of Birth/Sex: Treating RN: May 29, 1956 (67 y.o. [name]) Primary Care Provider: Cranford Mon Other Clinician: Referring Provider: Treating Provider/Extender: Shaune Pollack in Treatment: 12 Active Problems ICD-10 Encounter Code Description Active Date MDM Diagnosis I87.313 Chronic venous hypertension (idiopathic) with ulcer of bilateral lower extremity 12/24/2022 [name] Yes L97.818 Non-pressure chronic ulcer of other part of right lower leg with other specified 12/24/2022 [name] Yes severity L97.828 Non-pressure chronic ulcer of other part of left lower leg with other specified 12/24/2022 [name] Yes severity I89.0 Lymphedema, not elsewhere classified 12/24/2022 [name] Yes E11.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 other skin ulcer 12/24/2022 [name] Yes K74.69 Other cirrhosis of liver 12/24/2022 [name] Yes Inactive  Problems Resolved Problems Electronic Signature(s) Signed: 03/18/2023 12:36:03 PM By: Geralyn Corwin DO Entered By: Geralyn Corwin on 03/18/2023 10:59:37 -------------------------------------------------------------------------------- Progress Note Details Patient Name: Date of Service: [name] [name], [name] [name]. 03/18/2023 9:30 A [name] Medical Record Number: [id number] Patient Account Number: [id number] Date of Birth/Sex: Treating RN: 31-Aug-1955 (67 y.o. [name]) Primary Care Provider: Cranford Mon Other Clinician: Referring Provider: Treating Provider/Extender: Shaune Pollack in Treatment: [address], [place] ([id number]) [id number] Page 6 of 10 Chief Complaint Information obtained from Patient 12/24/2022; bilateral lower extremity wounds History of Present Illness (HPI) 12/24/2022  [name] is a 67 year old male with a past medical history of controlled type 2 diabetes on insulin, lymphedema/chronic venous insufficiency and cirrhosis that presents to the clinic for a 1 year history of nonhealing wounds to his lower extremities bilaterally. He reports the starting spontaneously. He states that he has been following with [facility] and they have been using [facility] antibiotic spray to the legs. He is not wearing compression stockings or wraps. He is also tried Medihoney and collagen in the past with little benefit. He currently denies systemic signs of infection. 5/30; patient presents for follow-up. He has been using triamcinolone cream to the periwound and Santyl to the wound beds. There has been improvement in wound healing. He denies signs of infection and has [name] issues or complaints today. 6/4; both legs look considerably better. He has a wound on the right lateral lower leg and the left medial lower leg. Significant hemosiderin in the right leg less so on the left. We have been using Hydrofera Blue under  compression 6/13; patient presents for follow-up. We have been using antibiotic ointment with Hydrofera Blue under compression therapy. The wounds are smaller. He has [name] issues or complaints today. We discussed ordering juxta lite compression garment wraps T use once his wounds heal as he has hard time putting on o compression stockings. He was agreeable with this. 6/20; patient presents for follow-up. We have been using antibiotic ointment with Hydrofera Blue under compression therapy to the lower extremities bilaterally. Wounds are smaller. He has been approved for PuraPly and was agreeable with placement today. He denies signs of infection. 6/27; patient presents for follow-up. We have been using antibiotic ointment with Hydrofera Blue under compression therapy to the left lower extremity. This wound is healed. He has his juxta lite compression wraps with him today. T the right lateral leg we have been using PuraPly under compression and that wound o is smaller today. Unfortunately he has developed skin breakdown from the Steri-Strip that was used to hold the PuraPly in place and this has created a new wound on the right leg. 7/8; patient presents for follow-up. We have been using PuraPly to the right lateral leg wound Under compression wrap. Wound is slightly smaller however original wound used for PuraPly is healed. He unfortunately developed a new wound to the left lower extremity but it looks like this was from potential scratching. He has been using his juxta lite compression here. 716; patient presents for follow-up. We have been using antibiotic ointment with Hydrofera Blue under 3 layer compression to lower extremities bilaterally. Unfortunately he has continued to develop wounds to the left lower extremity. He currently denies signs of infection. 7/22; patient presents for follow-up. He has been using Santyl and Hydrofera Blue with triamcinolone cream under her juxta lite compression  wraps daily. [name] new wounds today. Current wounds are stable. Less irritation to the periwound. 7/29; patient presents for follow-up. He has been using Santyl and Hydrofera Blue with triamcinolone cream under the juxta lite compression wraps. He is having a hard time keeping the [facility] in place to the wound beds. He denies signs of infection. 8/5; patient presents for follow-up. Has been using Santyl to the wound beds and triamcinolone cream under the juxta lite compression. He has [name] issues or complaints today. Wounds are overall stable. 8/12; patient presents for follow-up. He has been using Santyl to the wound beds and triamcinolone cream to the periwound under JuxtaLite compression. He is scheduled  to see dermatology this week. Wounds are overall stable. Patient History Family History Diabetes - Father, Heart Disease - Father. Social History Current every day smoker, Marital Status - Single, Alcohol Use - Never, Drug Use - [name] History, Caffeine Use - Never. Medical History Hematologic/Lymphatic Patient has history of Anemia, Lymphedema Cardiovascular Patient has history of Hypertension Gastrointestinal Patient has history of Cirrhosis - crpytogenic Endocrine Patient has history of Type II Diabetes - 6.2 HgbA1c Medical A Surgical History Notes nd Gastrointestinal GERD Genitourinary stage III CKD Objective Constitutional respirations regular, non-labored and within target range for patient.. Vitals Time Taken: 9:25 AM, Height: 72 in, Weight: 245 lbs, BMI: 33.2, Temperature: 98.1 F, Pulse: 110 bpm, Respiratory Rate: 18 breaths/min, Blood Pressure: 152/67 mmHg. [name] [name], [name] [name] ([id number]) [id number] Page 7 of 10 Cardiovascular 2+ dorsalis pedis/posterior tibialis pulses. Psychiatric pleasant and cooperative. General Notes: Right lower extremity: several small scattered open wound with non viable tissue. [name] surrounding signs of infection.  Venous stasis dermatitis. Left lower extremity: 3 open wounds with granulation tissue and nonviable tissue. Again [name] signs of surrounding infection. Venous stasis dermatitis. Decent edema control. Integumentary (Hair, Skin) Wound #3 status is Open. Original cause of wound was Gradually Appeared. The date acquired was: 01/25/2023. The wound has been in treatment 6 weeks. The wound is located on the Right,Proximal,Lateral Lower Leg. The wound measures 0.5cm length x 0.4cm width x 0.1cm depth; 0.157cm^2 area and 0.016cm^3 volume. There is Fat Layer (Subcutaneous Tissue) exposed. There is [name] tunneling or undermining noted. There is a medium amount of serosanguineous drainage noted. The wound margin is distinct with the outline attached to the wound base. There is small (1-33%) pink granulation within the wound bed. There is a large (67-100%) amount of necrotic tissue within the wound bed including Adherent Slough. The periwound skin appearance did not exhibit: Callus, Crepitus, Excoriation, Induration, Rash, Scarring, Dry/Scaly, Maceration, Atrophie Blanche, Cyanosis, Ecchymosis, Hemosiderin Staining, Mottled, Pallor, Rubor, Erythema. Periwound temperature was noted as [name] Abnormality. Wound #4 status is Open. Original cause of wound was Gradually Appeared. The date acquired was: 02/11/2023. The wound has been in treatment 5 weeks. The wound is located on the Left,Distal,Medial Lower Leg. The wound measures 0.7cm length x 0.8cm width x 0.1cm depth; 0.44cm^2 area and 0.044cm^3 volume. There is Fat Layer (Subcutaneous Tissue) exposed. There is [name] tunneling or undermining noted. There is a medium amount of serosanguineous drainage noted. The wound margin is distinct with the outline attached to the wound base. There is small (1-33%) pink granulation within the wound bed. There is a large (67- 100%) amount of necrotic tissue within the wound bed including Adherent Slough. The periwound skin appearance exhibited:  Scarring, Hemosiderin Staining. The periwound skin appearance did not exhibit: Callus, Crepitus, Excoriation, Induration, Rash, Dry/Scaly, Maceration, Atrophie Blanche, Cyanosis, Ecchymosis, Mottled, Pallor, Rubor, Erythema. Periwound temperature was noted as [name] Abnormality. Wound #5 status is Open. Original cause of wound was Blister. The date acquired was: 02/19/2023. The wound has been in treatment 3 weeks. The wound is located on the Right,Distal,Lateral Lower Leg. The wound measures 0.8cm length x 0.9cm width x 0.1cm depth; 0.565cm^2 area and 0.057cm^3 volume. There is Fat Layer (Subcutaneous Tissue) exposed. There is [name] tunneling or undermining noted. There is a medium amount of serosanguineous drainage noted. The wound margin is distinct with the outline attached to the wound base. There is small (1-33%) pink granulation within the wound bed. There is a large (67-100%) amount of necrotic tissue within the wound bed. The  periwound skin appearance did not exhibit: Callus, Crepitus, Excoriation, Induration, Rash, Scarring, Dry/Scaly, Maceration, Atrophie Blanche, Cyanosis, Ecchymosis, Hemosiderin Staining, Mottled, Pallor, Rubor, Erythema. Periwound temperature was noted as [name] Abnormality. Wound #6 status is Open. Original cause of wound was Blister. The date acquired was: 02/19/2023. The wound has been in treatment 3 weeks. The wound is located on the Left,Proximal,Posterior Lower Leg. The wound measures 1.5cm length x 1cm width x 0.1cm depth; 1.178cm^2 area and 0.118cm^3 volume. There is Fat Layer (Subcutaneous Tissue) exposed. There is [name] tunneling or undermining noted. There is a medium amount of serosanguineous drainage noted. The wound margin is distinct with the outline attached to the wound base. There is small (1-33%) pink granulation within the wound bed. There is a large (67-100%) amount of necrotic tissue within the wound bed including Adherent Slough. The periwound skin appearance did  not exhibit: Callus, Crepitus, Excoriation, Induration, Rash, Scarring, Dry/Scaly, Maceration, Atrophie Blanche, Cyanosis, Ecchymosis, Hemosiderin Staining, Mottled, Pallor, Rubor, Erythema. Periwound temperature was noted as [name] Abnormality. Wound #7 status is Open. Original cause of wound was Blister. The date acquired was: 02/19/2023. The wound has been in treatment 3 weeks. The wound is located on the Left,Distal,Posterior Lower Leg. The wound measures 0.4cm length x 0.4cm width x 0.1cm depth; 0.126cm^2 area and 0.013cm^3 volume. There is Fat Layer (Subcutaneous Tissue) exposed. There is [name] tunneling or undermining noted. There is a medium amount of serosanguineous drainage noted. The wound margin is distinct with the outline attached to the wound base. There is small (1-33%) red, pink granulation within the wound bed. There is a large (67- 100%) amount of necrotic tissue within the wound bed including Eschar and Adherent Slough. The periwound skin appearance did not exhibit: Callus, Crepitus, Excoriation, Induration, Rash, Scarring, Dry/Scaly, Maceration, Atrophie Blanche, Cyanosis, Ecchymosis, Hemosiderin Staining, Mottled, Pallor, Rubor, Erythema. Periwound temperature was noted as [name] Abnormality. 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 He is scheduled to see dermatology later this week. For now recommended continuing Santyl and his juxta light compression but adding PolyMem silver to the wound bed to see if this will help with further healing. I did recommend a venous reflux study and he was agreeable to  this. Follow-up in 1 week. Plan Follow-up Appointments: Return Appointment in 1 week. - Dr. Mikey Bussing Rm 8 or 9 Monday 03/25/23 at 9:30am (already scheduled) Return Appointment in 2 weeks. - Dr. Mikey Bussing Room 7 Tuesday 04/02/23 at 8:15am Other: - Upcoming Dermatology appointment o Anesthetic: (In clinic) Topical Lidocaine 4% applied to wound bed Bathing/ Shower/ Hygiene: May shower with protection but do not get wound dressing(s) wet. Protect dressing(s) with water repellant cover (for example, large plastic bag) or a cast cover and may then take shower. Edema Control - Lymphedema / SCD / Other: Elevate legs to the level of the heart or above for 30 minutes daily and/or when sitting for 3-4 times a day throughout the day. [name] [name], [name] [name] ([id number]) [id number] Page 8 of 10 Avoid standing for long periods of time. Patient to wear own compression stockings every day. Exercise regularly Compression stocking or Garment 30-40 mm/Hg pressure to: - wear juxatlite HDs both legs. Consults ordered were: Vascular -  Venous Reflux Studies. Venous Insufficiency with lower leg edema and non-healing wounds. ICD: I87.313 WOUND #3: - Lower Leg Wound Laterality: Right, Lateral, Proximal Cleanser: Soap and Water 1 x Per Day/15 Days Discharge Instructions: May shower and wash wound with dial antibacterial soap and water prior to dressing change. Peri-Wound Care: Triamcinolone 15 (g) 1 x Per Day/15 Days Discharge Instructions: Use triamcinolone apply to entire both legs. around the wounds. Prim Dressing: PolyMem Silver Non-Adhesive Dressing, 4.25x4.25 in 1 x Per Day/15 Days ary Discharge Instructions: Apply to wound bed as instructed Prim Dressing: Santyl Ointment 1 x Per Day/15 Days ary Discharge Instructions: Apply nickel thick amount to wound bed as instructed Secondary Dressing: Woven Gauze Sponge, Non-Sterile 4x4 in (Generic) 1 x Per Day/15 Days Discharge Instructions: Apply  over primary dressing as directed. Secured With: [employer], 4.5x3.1 (in/yd) (Generic) 1 x Per Day/15 Days Discharge Instructions: Secure with Kerlix as directed. Secured With: 30M Medipore H Soft Cloth Surgical T ape, 4 x 10 (in/yd) (Generic) 1 x Per Day/15 Days Discharge Instructions: Secure with tape as directed. WOUND #4: - Lower Leg Wound Laterality: Left, Medial, Distal Cleanser: Soap and Water 1 x Per Day/15 Days Discharge Instructions: May shower and wash wound with dial antibacterial soap and water prior to dressing change. Peri-Wound Care: Triamcinolone 15 (g) 1 x Per Day/15 Days Discharge Instructions: Use triamcinolone apply to entire both legs. around the wounds. Prim Dressing: PolyMem Silver Non-Adhesive Dressing, 4.25x4.25 in 1 x Per Day/15 Days ary Discharge Instructions: Apply to wound bed as instructed Prim Dressing: Santyl Ointment 1 x Per Day/15 Days ary Discharge Instructions: Apply nickel thick amount to wound bed as instructed Secondary Dressing: Woven Gauze Sponge, Non-Sterile 4x4 in (Generic) 1 x Per Day/15 Days Discharge Instructions: Apply over primary dressing as directed. Secured With: [employer], 4.5x3.1 (in/yd) (Generic) 1 x Per Day/15 Days Discharge Instructions: Secure with Kerlix as directed. Secured With: 30M Medipore H Soft Cloth Surgical T ape, 4 x 10 (in/yd) (Generic) 1 x Per Day/15 Days Discharge Instructions: Secure with tape as directed. WOUND #5: - Lower Leg Wound Laterality: Right, Lateral, Distal Cleanser: Soap and Water 1 x Per Day/15 Days Discharge Instructions: May shower and wash wound with dial antibacterial soap and water prior to dressing change. Peri-Wound Care: Triamcinolone 15 (g) 1 x Per Day/15 Days Discharge Instructions: Use triamcinolone apply to entire both legs. around the wounds. Prim Dressing: PolyMem Silver Non-Adhesive Dressing, 4.25x4.25 in 1 x Per Day/15 Days ary Discharge Instructions: Apply to wound bed  as instructed Prim Dressing: Santyl Ointment 1 x Per Day/15 Days ary Discharge Instructions: Apply nickel thick amount to wound bed as instructed Secondary Dressing: Woven Gauze Sponge, Non-Sterile 4x4 in (Generic) 1 x Per Day/15 Days Discharge Instructions: Apply over primary dressing as directed. Secured With: [employer], 4.5x3.1 (in/yd) (Generic) 1 x Per Day/15 Days Discharge Instructions: Secure with Kerlix as directed. Secured With: 30M Medipore H Soft Cloth Surgical T ape, 4 x 10 (in/yd) (Generic) 1 x Per Day/15 Days Discharge Instructions: Secure with tape as directed. WOUND #6: - Lower Leg Wound Laterality: Left, Posterior, Proximal Cleanser: Soap and Water 1 x Per Day/15 Days Discharge Instructions: May shower and wash wound with dial antibacterial soap and water prior to dressing change. Peri-Wound Care: Triamcinolone 15 (g) 1 x Per Day/15 Days Discharge Instructions: Use triamcinolone apply to entire both legs. around the wounds. Prim Dressing: PolyMem Silver Non-Adhesive Dressing, 4.25x4.25 in 1 x Per Day/15 Days ary Discharge Instructions:  Apply to wound bed as instructed Prim Dressing: Santyl Ointment 1 x Per Day/15 Days ary Discharge Instructions: Apply nickel thick amount to wound bed as instructed Secondary Dressing: Woven Gauze Sponge, Non-Sterile 4x4 in (Generic) 1 x Per Day/15 Days Discharge Instructions: Apply over primary dressing as directed. Secured With: [employer], 4.5x3.1 (in/yd) (Generic) 1 x Per Day/15 Days Discharge Instructions: Secure with Kerlix as directed. Secured With: 89M Medipore H Soft Cloth Surgical T ape, 4 x 10 (in/yd) (Generic) 1 x Per Day/15 Days Discharge Instructions: Secure with tape as directed. WOUND #7: - Lower Leg Wound Laterality: Left, Posterior, Distal Cleanser: Soap and Water 1 x Per Day/15 Days Discharge Instructions: May shower and wash wound with dial antibacterial soap and water prior to dressing  change. Peri-Wound Care: Triamcinolone 15 (g) 1 x Per Day/15 Days Discharge Instructions: Use triamcinolone apply to entire both legs. around the wounds. Prim Dressing: PolyMem Silver Non-Adhesive Dressing, 4.25x4.25 in 1 x Per Day/15 Days ary Discharge Instructions: Apply to wound bed as instructed Prim Dressing: Santyl Ointment 1 x Per Day/15 Days ary Discharge Instructions: Apply nickel thick amount to wound bed as instructed Secondary Dressing: Woven Gauze Sponge, Non-Sterile 4x4 in (Generic) 1 x Per Day/15 Days Discharge Instructions: Apply over primary dressing as directed. Secured With: [employer], 4.5x3.1 (in/yd) (Generic) 1 x Per Day/15 Days Discharge Instructions: Secure with Kerlix as directed. Secured With: 89M Medipore H Soft Cloth Surgical T ape, 4 x 10 (in/yd) (Generic) 1 x Per Day/15 Days Discharge Instructions: Secure with tape as directed. 1. PolyMem silver with Santyl under juxta light compression to the lower extremities bilaterally daily 2. Follow-up in 1 week 3. Venous reflux studies Electronic Signature(s) [name] [name], [name] [name] ([id number]) [id number] Page 9 of 10 Signed: 03/18/2023 3:26:57 PM By: [name] Stall RN, BSN Signed: 03/18/2023 4:52:09 PM By: Geralyn Corwin DO Previous Signature: 03/18/2023 12:36:03 PM Version By: Geralyn Corwin DO Entered By: [name] [name] on 03/18/2023 15:24:28 -------------------------------------------------------------------------------- HxROS Details Patient Name: Date of Service: [name] [name], [name] [name]. 03/18/2023 9:30 A [name] Medical Record Number: [id number] Patient Account Number: [id number] Date of Birth/Sex: Treating RN: Nov 16, 1955 (67 y.o. [name]) Primary Care Provider: Cranford Mon Other Clinician: Referring Provider: Treating Provider/Extender: Shaune Pollack in Treatment: 12 Hematologic/Lymphatic Medical History: Positive for: Anemia; Lymphedema Cardiovascular Medical  History: Positive for: Hypertension Gastrointestinal Medical History: Positive for: Cirrhosis - crpytogenic Past Medical History Notes: GERD Endocrine Medical History: Positive for: Type II Diabetes - 6.2 HgbA1c Time with diabetes: 20 yeatrs Treated with: Insulin Genitourinary Medical History: Past Medical History Notes: stage III CKD Immunizations Pneumococcal Vaccine: Received Pneumococcal Vaccination: [name] Implantable Devices [name] devices added Family and Social History Diabetes: Yes - Father; Heart Disease: Yes - Father; Current every day smoker; Marital Status - Single; Alcohol Use: Never; Drug Use: [name] History; Caffeine Use: Never; Financial Concerns: [name]; Food, Clothing or Shelter Needs: [name]; Support System Lacking: [name]; Transportation Concerns: [name] Electronic Signature(s) Signed: 03/18/2023 12:36:03 PM By: Geralyn Corwin DO Entered By: Geralyn Corwin on 03/18/2023 11:01:53 Cay Schillings ([id number]) [id number] Page 10 of 10 -------------------------------------------------------------------------------- SuperBill Details Patient Name: Date of Service: [name] [name] 03/18/2023 Medical Record Number: [id number] Patient Account Number: [id number] Date of Birth/Sex: Treating RN: 1956/03/30 (67 y.o. Dianna Limbo Primary Care Provider: Cranford Mon Other Clinician: Referring Provider: Treating Provider/Extender: Shaune Pollack in Treatment: 12 Diagnosis Coding ICD-10 Codes Code Description [phone] Chronic venous hypertension (idiopathic) with ulcer of  bilateral lower extremity L97.818 Non-pressure chronic ulcer of other part of right lower leg with other specified severity L97.828 Non-pressure chronic ulcer of other part of left lower leg with other specified severity I89.0 Lymphedema, not elsewhere classified E11.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 other skin ulcer K74.69 Other cirrhosis of liver Facility  Procedures : CPT4 Code: [id number] Description: 99214 - WOUND CARE VISIT-LEV 4 EST PT Modifier: Quantity: 1 Physician Procedures : CPT4 Code Description Modifier [id number] - WC PHYS LEVEL 3 - EST PT ICD-10 Diagnosis Description I87.313 Chronic venous hypertension (idiopathic) with ulcer of bilateral lower extremity L97.818 Non-pressure chronic ulcer of other part of right  lower leg with other specified severity L97.828 Non-pressure chronic ulcer of other part of left lower leg with other specified severity E11.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 other skin ulcer Quantity: 1 Electronic Signature(s) Signed: 03/18/2023 12:36:03 PM By: Geralyn Corwin DO Entered By: Geralyn Corwin on 03/18/2023 11:03:50

## 2023-03-18 NOTE — Progress Notes (Signed)
[name] [name], [name] [name] ([id number]) [id number] Page 1 of 17 Visit Report for 03/18/2023 Arrival Information Details Patient Name: Date of Service: [name] [name]. 03/18/2023 9:30 A M Medical Record Number: [id number] Patient Account Number: [id number] Date of Birth/Sex: Treating RN: [birth year] (67 y.o. [name] [name] Primary Care : [name] [name] Other Clinician: Referring : Treating /Extender: [name] [name] in Treatment: 12 Visit Information History Since Last Visit Added or deleted any medications: No Patient Arrived: Ambulatory Any new allergies or adverse reactions: No Arrival Time: 09:25 Had a fall or experienced change in No Accompanied By: sister activities of daily living that may affect Transfer Assistance: None risk of falls: Patient Identification Verified: Yes Signs or symptoms of abuse/neglect since last visito No Secondary Verification Process Completed: Yes Hospitalized since last visit: No Patient Requires Transmission-Based No Implantable device outside of the clinic excluding No Precautions: cellular tissue based products placed in the center Patient Has Alerts: Yes since last visit: Patient Alerts: 10/23 ABI L0.94 R1 VVS Has Dressing in Place as Prescribed: Yes 10/23 TBI L1.2 R0.88 VVS Has Compression in Place as Prescribed: Yes Pain Present Now: No Electronic Signature(s) Signed: 03/18/2023 2:18:39 PM By: [name] Schwalbe RN Entered By: [name] [name] on 03/18/2023 09:25:30 -------------------------------------------------------------------------------- Clinic Level of Care Assessment Details Patient Name: Date of Service: [name] [name]. 03/18/2023 9:30 A M Medical Record Number: [id number] Patient Account Number: [id number] Date of Birth/Sex: Treating RN: 05-22-1956 (67 y.o. [name] [name] Primary Care : [name] [name] Other Clinician: Referring : Treating  /Extender: [name] [name] in Treatment: 12 Clinic Level of Care Assessment Items TOOL 1 Quantity Score X- 1 0 Use when EandM and Procedure is performed on INITIAL visit ASSESSMENTS - Nursing Assessment / Reassessment X- 1 20 General Physical Exam (combine w/ comprehensive assessment (listed just below) when performed on new pt. evals) X- 1 25 Comprehensive Assessment (HX, ROS, Risk Assessments, Wounds Hx, etc.) ASSESSMENTS - Wound and Skin Assessment / Reassessment X- 1 10 Dermatologic / Skin Assessment (not related to wound area) ASSESSMENTS - Ostomy and/or Continence Assessment and Care X- 1 10 Incontinence Assessment and Management X- 1 20 Ostomy Care Assessment and Management (repouching, etc.) PROCESS - Coordination of Care X - Simple Patient / Family Education for ongoing care 1 15 [name], [name] ([id number]) [id number] Page 2 of 17 []  - 0 Complex (extensive) Patient / Family Education for ongoing care X- 1 10 Staff obtains Consents, Records, T Results / Process Orders est X- 1 10 Staff telephones HHA, Nursing Homes / Clarify orders / etc []  - 0 Routine Transfer to another Facility (non-emergent condition) []  - 0 Routine Hospital Admission (non-emergent condition) []  - 0 New Admissions / Manufacturing engineer / Ordering NPWT Apligraf, etc. , []  - 0 Emergency Hospital Admission (emergent condition) PROCESS - Special Needs []  - 0 Pediatric / Minor Patient Management []  - 0 Isolation Patient Management []  - 0 Hearing / Language / Visual special needs []  - 0 Assessment of Community assistance (transportation, D/C planning, etc.) []  - 0 Additional assistance / Altered mentation []  - 0 Support Surface(s) Assessment (bed, cushion, seat, etc.) INTERVENTIONS - Miscellaneous []  - 0 External ear exam []  - 0 Patient Transfer (multiple staff / Nurse, adult / Similar devices) []  - 0 Simple Staple / Suture  removal (25 or less) []  - 0 Complex Staple / Suture removal (26 or more) []  - 0 Hypo/Hyperglycemic Management (do not check if billed  separately) []  - 0 Ankle / Brachial Index (ABI) - do not check if billed separately Has the patient been seen at the hospital within the last three years: Yes Total Score: 120 Level Of Care: New/Established - Level 4 Electronic Signature(s) Signed: 03/18/2023 2:18:39 PM By: [name] Schwalbe RN Entered By: [name] [name] on 03/18/2023 10:32:24 -------------------------------------------------------------------------------- Encounter Discharge Information Details Patient Name: Date of Service: [name] [name] [name] RRIS M. 03/18/2023 9:30 A M Medical Record Number: [id number] Patient Account Number: [id number] Date of Birth/Sex: Treating RN: November 09, 1955 (67 y.o. [name] [name] Primary Care : [name] [name] Other Clinician: Referring : Treating /Extender: [name] [name] in Treatment: 12 Encounter Discharge Information Items Discharge Condition: Stable Ambulatory Status: Ambulatory Discharge Destination: Home Transportation: Private Auto Accompanied By: sister Schedule Follow-up Appointment: Yes Clinical Summary of Care: Patient Declined Electronic Signature(s) Signed: 03/18/2023 2:18:39 PM By: [name] Schwalbe RN Entered By: [name] [name] on 03/18/2023 10:34:03 [name] [name] ([id number]) [id number] Page 3 of 17 -------------------------------------------------------------------------------- Lower Extremity Assessment Details Patient Name: Date of Service: [name] [name]. 03/18/2023 9:30 A M Medical Record Number: [id number] Patient Account Number: [id number] Date of Birth/Sex: Treating RN: [birth year] (67 y.o. [name] [name] Primary Care : [name] [name] Other Clinician: Referring : Treating /Extender: [name] [name] in Treatment:  12 Edema Assessment Assessed: [name] [name]: No] [name] [name]: No] Edema: [Left: No] [Right: No] Calf Left: Right: Point of Measurement: 37 cm From Medial Instep 34 cm 34.5 cm Ankle Left: Right: Point of Measurement: 13 cm From Medial Instep 22.4 cm 23 cm Vascular Assessment Pulses: Dorsalis Pedis Palpable: [Left:Yes] [Right:Yes] Extremity colors, hair growth, and conditions: Extremity Color: [Left:Hyperpigmented] [Right:Hyperpigmented] Hair Growth on Extremity: [Left:No] [Right:No] Temperature of Extremity: [Left:Warm] [Right:Warm] Capillary Refill: [Left:< 3 seconds] [Right:< 3 seconds] Dependent Rubor: [Left:No No] [Right:No Yes] Electronic Signature(s) Signed: 03/18/2023 2:18:39 PM By: [name] Schwalbe RN Entered By: [name] [name] on 03/18/2023 09:26:53 -------------------------------------------------------------------------------- Multi Wound Chart Details Patient Name: Date of Service: [name] [name], [name] RRIS M. 03/18/2023 9:30 A M Medical Record Number: [id number] Patient Account Number: [id number] Date of Birth/Sex: Treating RN: 23-Dec-1955 (67 y.o. M) Primary Care : [name] [name] Other Clinician: Referring : Treating /Extender: [name] [name] in Treatment: 12 Vital Signs Height(in): 72 Pulse(bpm): 110 Weight(lbs): 245 Blood Pressure(mmHg): 152/67 Body Mass Index(BMI): 33.2 Temperature(F): 98.1 Respiratory Rate(breaths/min): [address] ([id number]) [3:Photos:] [phone].pdf Page 4 of 17] Right, Proximal, Lateral Lower Leg Left, Distal, Medial Lower Leg Right, Distal, Lateral Lower Leg Wound Location: Gradually Appeared Gradually Appeared Blister Wounding Event: Diabetic Wound/Ulcer of the Lower Diabetic Wound/Ulcer of the Lower Diabetic Wound/Ulcer of the Lower Primary Etiology: Extremity Extremity Extremity N/A Venous Leg Ulcer N/A Secondary Etiology: Anemia, Lymphedema, Hypertension, Anemia,  Lymphedema, Hypertension, Anemia, Lymphedema, Hypertension, Comorbid History: Cirrhosis , Type II Diabetes Cirrhosis , Type II Diabetes Cirrhosis , Type II Diabetes 01/25/2023 02/11/2023 02/19/2023 Date Acquired: 6 5 3  Weeks of Treatment: Open Open Open Wound Status: No No No Wound Recurrence: No No Yes Clustered Wound: 3 N/A 4 Clustered Quantity: 0.5x0.4x0.1 0.7x0.8x0.1 3.2x3x0.1 Measurements L x W x D (cm) 0.157 0.44 7.54 A (cm) : rea 0.016 0.044 0.754 Volume (cm) : -121.10% -14.30% -1085.50% % Reduction in A rea: -14.30% -15.80% -1078.10% % Reduction in Volume: Grade 1 Grade 1 Grade 1 Classification: Medium Medium Medium Exudate A mount: Serosanguineous Serosanguineous Serosanguineous Exudate Type: red, brown red, brown red, brown Exudate Color: Distinct, outline attached Distinct, outline attached Distinct,  outline attached Wound Margin: Small (1-33%) Small (1-33%) Small (1-33%) Granulation A mount: Pink Pink Pink Granulation Quality: Large (67-100%) Large (67-100%) Large (67-100%) Necrotic A mount: Adherent Slough Adherent [employer] Necrotic Tissue: Fat Layer (Subcutaneous Tissue): Yes Fat Layer (Subcutaneous Tissue): Yes Fat Layer (Subcutaneous Tissue): Yes Exposed Structures: Fascia: No Fascia: No Fascia: No Tendon: No Tendon: No Tendon: No Muscle: No Muscle: No Muscle: No Joint: No Joint: No Joint: No Bone: No Bone: No Bone: No Small (1-33%) Small (1-33%) Small (1-33%) Epithelialization: Excoriation: No Scarring: Yes Excoriation: No Periwound Skin Texture: Induration: No Excoriation: No Induration: No Callus: No Induration: No Callus: No Crepitus: No Callus: No Crepitus: No Rash: No Crepitus: No Rash: No Scarring: No Rash: No Scarring: No Maceration: No Maceration: No Maceration: No Periwound Skin Moisture: Dry/Scaly: No Dry/Scaly: No Dry/Scaly: No Atrophie Blanche: No Hemosiderin Staining: Yes Atrophie  Blanche: No Periwound Skin Color: Cyanosis: No Atrophie Blanche: No Cyanosis: No Ecchymosis: No Cyanosis: No Ecchymosis: No Erythema: No Ecchymosis: No Erythema: No Hemosiderin Staining: No Erythema: No Hemosiderin Staining: No Mottled: No Mottled: No Mottled: No Pallor: No Pallor: No Pallor: No Rubor: No Rubor: No Rubor: No No Abnormality No Abnormality No Abnormality Temperature: Wound Number: 6 7 N/A Photos: N/A Left, Proximal, Posterior Lower Leg Left, Distal, Posterior Lower Leg N/A Wound Location: Blister Blister N/A Wounding Event: Diabetic Wound/Ulcer of the Lower Diabetic Wound/Ulcer of the Lower N/A Primary Etiology: Extremity Extremity N/A N/A N/A Secondary Etiology: Anemia, Lymphedema, Hypertension, Anemia, Lymphedema, Hypertension, N/A Comorbid History: Cirrhosis , Type II Diabetes Cirrhosis , Type II Diabetes 02/19/2023 02/19/2023 N/A Date Acquired: 3 3 N/A Weeks of Treatment: Open Open N/A Wound Status: No No N/A Wound Recurrence: No No N/A Clustered Wound: N/A N/A N/A Clustered QuantitySUHAAS, [name] [name] ([id number]) [id number] Page 5 of 17 1.5x1x0.1 0.4x0.4x0.1 N/A Measurements L x W x D (cm) 1.178 0.126 N/A A (cm) : rea 0.118 0.013 N/A Volume (cm) : 28.60% 79.90% N/A % Reduction in A rea: 28.50% 79.40% N/A % Reduction in Volume: Grade 1 Grade 1 N/A Classification: Medium Medium N/A Exudate A mount: Serosanguineous Serosanguineous N/A Exudate Type: red, brown red, brown N/A Exudate Color: Distinct, outline attached Distinct, outline attached N/A Wound Margin: Small (1-33%) Small (1-33%) N/A Granulation A mount: Pink Red, Pink N/A Granulation Quality: Large (67-100%) Large (67-100%) N/A Necrotic A mount: Adherent Slough Eschar, Adherent Slough N/A Necrotic Tissue: Fat Layer (Subcutaneous Tissue): Yes Fat Layer (Subcutaneous Tissue): Yes N/A Exposed Structures: Fascia: No Fascia: No Tendon:  No Tendon: No Muscle: No Muscle: No Joint: No Joint: No Bone: No Bone: No Small (1-33%) Small (1-33%) N/A Epithelialization: Excoriation: No Excoriation: No N/A Periwound Skin Texture: Induration: No Induration: No Callus: No Callus: No Crepitus: No Crepitus: No Rash: No Rash: No Scarring: No Scarring: No Maceration: No Maceration: No N/A Periwound Skin Moisture: Dry/Scaly: No Dry/Scaly: No Atrophie Blanche: No Atrophie Blanche: No N/A Periwound Skin Color: Cyanosis: No Cyanosis: No Ecchymosis: No Ecchymosis: No Erythema: No Erythema: No Hemosiderin Staining: No Hemosiderin Staining: No Mottled: No Mottled: No Pallor: No Pallor: No Rubor: No Rubor: No No Abnormality No Abnormality N/A Temperature: Treatment Notes Wound #3 (Lower Leg) Wound Laterality: Right, Lateral, Proximal Cleanser Soap and Water Discharge Instruction: May shower and wash wound with dial antibacterial soap and water prior to dressing change. Peri-Wound Care Triamcinolone 15 (g) Discharge Instruction: Use triamcinolone apply to entire both legs. around the wounds. Topical Primary Dressing Santyl Ointment Discharge Instruction: Apply nickel thick amount to  wound bed as instructed Secondary Dressing Woven Gauze Sponge, Non-Sterile 4x4 in Discharge Instruction: Apply over primary dressing as directed. Secured With [employer], 4.5x3.1 (in/yd) Discharge Instruction: Secure with Kerlix as directed. 58M Medipore [name] Soft Cloth Surgical T ape, 4 x 10 (in/yd) Discharge Instruction: Secure with tape as directed. Compression Wrap Compression Stockings Add-Ons Wound #4 (Lower Leg) Wound Laterality: Left, Medial, Distal Cleanser Soap and Water Discharge Instruction: May shower and wash wound with dial antibacterial soap and water prior to dressing change. Peri-Wound Care [name] [name], [name] [name] ([id number]) [id number] Page 6 of 17 Triamcinolone 15  (g) Discharge Instruction: Use triamcinolone apply to entire both legs. around the wounds. Topical Primary Dressing Santyl Ointment Discharge Instruction: Apply nickel thick amount to wound bed as instructed Secondary Dressing Woven Gauze Sponge, Non-Sterile 4x4 in Discharge Instruction: Apply over primary dressing as directed. Secured With [employer], 4.5x3.1 (in/yd) Discharge Instruction: Secure with Kerlix as directed. 58M Medipore [name] Soft Cloth Surgical T ape, 4 x 10 (in/yd) Discharge Instruction: Secure with tape as directed. Compression Wrap Compression Stockings Add-Ons Wound #5 (Lower Leg) Wound Laterality: Right, Lateral, Distal Cleanser Soap and Water Discharge Instruction: May shower and wash wound with dial antibacterial soap and water prior to dressing change. Peri-Wound Care Triamcinolone 15 (g) Discharge Instruction: Use triamcinolone apply to entire both legs. around the wounds. Topical Primary Dressing Santyl Ointment Discharge Instruction: Apply nickel thick amount to wound bed as instructed Secondary Dressing Woven Gauze Sponge, Non-Sterile 4x4 in Discharge Instruction: Apply over primary dressing as directed. Secured With [employer], 4.5x3.1 (in/yd) Discharge Instruction: Secure with Kerlix as directed. 58M Medipore [name] Soft Cloth Surgical T ape, 4 x 10 (in/yd) Discharge Instruction: Secure with tape as directed. Compression Wrap Compression Stockings Add-Ons Wound #6 (Lower Leg) Wound Laterality: Left, Posterior, Proximal Cleanser Soap and Water Discharge Instruction: May shower and wash wound with dial antibacterial soap and water prior to dressing change. Peri-Wound Care Triamcinolone 15 (g) Discharge Instruction: Use triamcinolone apply to entire both legs. around the wounds. Topical Primary Dressing Santyl Ointment Discharge Instruction: Apply nickel thick amount to wound bed as instructed Secondary Dressing Woven Gauze Sponge,  Non-Sterile 4x4 in [name] [name], [name] [name] ([id number]) [id number] Page 7 of 17 Discharge Instruction: Apply over primary dressing as directed. Secured With [employer], 4.5x3.1 (in/yd) Discharge Instruction: Secure with Kerlix as directed. 58M Medipore [name] Soft Cloth Surgical T ape, 4 x 10 (in/yd) Discharge Instruction: Secure with tape as directed. Compression Wrap Compression Stockings Add-Ons Wound #7 (Lower Leg) Wound Laterality: Left, Posterior, Distal Cleanser Soap and Water Discharge Instruction: May shower and wash wound with dial antibacterial soap and water prior to dressing change. Peri-Wound Care Triamcinolone 15 (g) Discharge Instruction: Use triamcinolone apply to entire both legs. around the wounds. Topical Primary Dressing Santyl Ointment Discharge Instruction: Apply nickel thick amount to wound bed as instructed Secondary Dressing Woven Gauze Sponge, Non-Sterile 4x4 in Discharge Instruction: Apply over primary dressing as directed. Secured With [employer], 4.5x3.1 (in/yd) Discharge Instruction: Secure with Kerlix as directed. 58M Medipore [name] Soft Cloth Surgical T ape, 4 x 10 (in/yd) Discharge Instruction: Secure with tape as directed. Compression Wrap Compression Stockings Add-Ons Electronic Signature(s) Signed: 03/18/2023 12:36:03 PM By: Geralyn Corwin DO Entered By: Geralyn Corwin on 03/18/2023 10:59:51 -------------------------------------------------------------------------------- Multi-Disciplinary Care Plan Details Patient Name: Date of Service: [name] [name], [name] 03/18/2023 9:30 A M Medical Record Number: [id number] Patient Account Number: [id number] Date of Birth/Sex: Treating RN: 1956/03/27 (67 y.o. M) [place],  Randa Evens Primary Care : [name] [name] Other Clinician: Referring : Treating /Extender: [name] [name] in Treatment: 12 Active Inactive Pain, Acute or  Chronic BALTASAR, VISALLI ([id number]) [id number] Page 8 of 17 Nursing Diagnoses: Pain, acute or chronic: actual or potential Potential alteration in comfort, pain Goals: Patient will verbalize adequate pain control and receive pain control interventions during procedures as needed Date Initiated: 12/24/2022 Target Resolution Date: 05/08/2023 Goal Status: Active Patient/caregiver will verbalize comfort level met Date Initiated: 12/24/2022 Date Inactivated: 01/31/2023 Target Resolution Date: 02/14/2023 Goal Status: Met Interventions: Encourage patient to take pain medications as prescribed Provide education on pain management Treatment Activities: Administer pain control measures as ordered : 12/24/2022 Notes: Electronic Signature(s) Signed: 03/18/2023 2:18:39 PM By: [name] Schwalbe RN Entered By: [name] [name] on 03/18/2023 10:01:22 -------------------------------------------------------------------------------- Pain Assessment Details Patient Name: Date of Service: [name] [name] [name] RRIS M. 03/18/2023 9:30 A M Medical Record Number: [id number] Patient Account Number: [id number] Date of Birth/Sex: Treating RN: 1955-08-23 (67 y.o. [name] [name] Primary Care : [name] [name] Other Clinician: Referring : Treating /Extender: [name] [name] in Treatment: 12 Active Problems Location of Pain Severity and Description of Pain Patient Has Paino No Site Locations Rate the pain. Current Pain Level: 0 Pain Management and Medication Current Pain Management: Electronic Signature(s) Signed: 03/18/2023 2:18:39 PM By: [name] Schwalbe RN Entered By: [name] [name] on 03/18/2023 09:25:51 [name] [name] ([id number]) [id number] Page 9 of 17 -------------------------------------------------------------------------------- Patient/Caregiver Education Details Patient Name: Date of Service: [name] [name]. 8/12/2024andnbsp9:30 A M Medical Record Number: [id number] Patient Account Number: [id number] Date of Birth/Gender: Treating RN: 08/25/1955 (67 y.o. [name] [name] Primary Care Physician: [name] [name] Other Clinician: Referring Physician: Treating Physician/Extender: [name] [name] in Treatment: 12 Education Assessment Education Provided To: Patient Education Topics Provided Wound/Skin Impairment: Methods: Explain/Verbal Responses: Return demonstration correctly Electronic Signature(s) Signed: 03/18/2023 2:18:39 PM By: [name] Schwalbe RN Entered By: [name] [name] on 03/18/2023 10:01:37 -------------------------------------------------------------------------------- Wound Assessment Details Patient Name: Date of Service: [name] [name] [name] RRIS M. 03/18/2023 9:30 A M Medical Record Number: [id number] Patient Account Number: [id number] Date of Birth/Sex: Treating RN: 1955/11/06 (67 y.o. [name] [name] Primary Care : [name] [name] Other Clinician: Referring : Treating /Extender: [name] [name] in Treatment: 12 Wound Status Wound Number: 3 Primary Diabetic Wound/Ulcer of the Lower Extremity Etiology: Wound Location: Right, Proximal, Lateral Lower Leg Wound Status: Open Wounding Event: Gradually Appeared Comorbid Anemia, Lymphedema, Hypertension, Cirrhosis , Type II Date Acquired: 01/25/2023 History: Diabetes Weeks Of Treatment: 6 Clustered Wound: No Photos Wound Measurements Length: (cm) Width: (cm) [name] [name], [name] [name] ([id number]) Depth: (cm) Clustered Quantity: Area: (cm) Volume: (cm) 0.5 % Reduction in Area: -121.1% 0.4 % Reduction in Volume: -14.3% [id number] Page 10 of 17 0.1 Epithelialization: Small (1-33%) 3 Tunneling: No 0.157 Undermining: No 0.016 Wound Description Classification: Grade 1 Wound Margin: Distinct, outline attached Exudate Amount: Medium Exudate Type:  Serosanguineous Exudate Color: red, brown Foul Odor After Cleansing: No Slough/Fibrino Yes Wound Bed Granulation Amount: Small (1-33%) Exposed Structure Granulation Quality: Pink Fascia Exposed: No Necrotic Amount: Large (67-100%) Fat Layer (Subcutaneous Tissue) Exposed: Yes Necrotic Quality: Adherent Slough Tendon Exposed: No Muscle Exposed: No Joint Exposed: No Bone Exposed: No Periwound Skin Texture Texture Color No Abnormalities Noted: No No Abnormalities Noted: No Callus: No Atrophie Blanche: No Crepitus: No Cyanosis: No Excoriation: No Ecchymosis: No Induration: No Erythema: No Rash: No Hemosiderin Staining: No  Scarring: No Mottled: No Pallor: No Moisture Rubor: No No Abnormalities Noted: No Dry / Scaly: No Temperature / Pain Maceration: No Temperature: No Abnormality Treatment Notes Wound #3 (Lower Leg) Wound Laterality: Right, Lateral, Proximal Cleanser Soap and Water Discharge Instruction: May shower and wash wound with dial antibacterial soap and water prior to dressing change. Peri-Wound Care Triamcinolone 15 (g) Discharge Instruction: Use triamcinolone apply to entire both legs. around the wounds. Topical Primary Dressing Santyl Ointment Discharge Instruction: Apply nickel thick amount to wound bed as instructed Secondary Dressing Woven Gauze Sponge, Non-Sterile 4x4 in Discharge Instruction: Apply over primary dressing as directed. Secured With [employer], 4.5x3.1 (in/yd) Discharge Instruction: Secure with Kerlix as directed. 47M Medipore [name] Soft Cloth Surgical T ape, 4 x 10 (in/yd) Discharge Instruction: Secure with tape as directed. Compression Wrap Compression Stockings Add-Ons Electronic Signature(s) Signed: 03/18/2023 2:18:39 PM By: [name] Schwalbe RN Entered By: [name] [name] on 03/18/2023 09:37:34 [name] [name] ([id number]) [id number] Page 11 of  17 -------------------------------------------------------------------------------- Wound Assessment Details Patient Name: Date of Service: [name] [name]. 03/18/2023 9:30 A M Medical Record Number: [id number] Patient Account Number: [id number] Date of Birth/Sex: Treating RN: 03-29-1956 (67 y.o. [name] [name] Primary Care : [name] [name] Other Clinician: Referring : Treating /Extender: [name] [name] in Treatment: 12 Wound Status Wound Number: 4 Primary Etiology: Diabetic Wound/Ulcer of the Lower Extremity Wound Location: Left, Distal, Medial Lower Leg Secondary Venous Leg Ulcer Etiology: Wounding Event: Gradually Appeared Wound Status: Open Date Acquired: 02/11/2023 Comorbid Anemia, Lymphedema, Hypertension, Cirrhosis , Type II Weeks Of Treatment: 5 History: Diabetes Clustered Wound: No Photos Wound Measurements Length: (cm) 0 Width: (cm) 0 Depth: (cm) 0 Area: (cm) Volume: (cm) .7 % Reduction in Area: -14.3% .8 % Reduction in Volume: -15.8% .1 Epithelialization: Small (1-33%) 0.44 Tunneling: No 0.044 Undermining: No Wound Description Classification: Grade 1 Wound Margin: Distinct, outline attached Exudate Amount: Medium Exudate Type: Serosanguineous Exudate Color: red, brown Foul Odor After Cleansing: No Slough/Fibrino Yes Wound Bed Granulation Amount: Small (1-33%) Exposed Structure Granulation Quality: Pink Fascia Exposed: No Necrotic Amount: Large (67-100%) Fat Layer (Subcutaneous Tissue) Exposed: Yes Necrotic Quality: Adherent Slough Tendon Exposed: No Muscle Exposed: No Joint Exposed: No Bone Exposed: No Periwound Skin Texture Texture Color No Abnormalities Noted: No No Abnormalities Noted: No Callus: No Atrophie Blanche: No Crepitus: No Cyanosis: No Excoriation: No Ecchymosis: No Induration: No Erythema: No Rash: No Hemosiderin Staining: Yes Scarring: Yes Mottled: No Pallor:  No Moisture Rubor: No No Abnormalities Noted: No Dry / Scaly: No Temperature / Pain Maceration: No Temperature: No Abnormality [name] [name], [name] [name] ([id number]) [id number] Page 12 of 17 Treatment Notes Wound #4 (Lower Leg) Wound Laterality: Left, Medial, Distal Cleanser Soap and Water Discharge Instruction: May shower and wash wound with dial antibacterial soap and water prior to dressing change. Peri-Wound Care Triamcinolone 15 (g) Discharge Instruction: Use triamcinolone apply to entire both legs. around the wounds. Topical Primary Dressing Santyl Ointment Discharge Instruction: Apply nickel thick amount to wound bed as instructed Secondary Dressing Woven Gauze Sponge, Non-Sterile 4x4 in Discharge Instruction: Apply over primary dressing as directed. Secured With [employer], 4.5x3.1 (in/yd) Discharge Instruction: Secure with Kerlix as directed. 47M Medipore [name] Soft Cloth Surgical T ape, 4 x 10 (in/yd) Discharge Instruction: Secure with tape as directed. Compression Wrap Compression Stockings Add-Ons Electronic Signature(s) Signed: 03/18/2023 2:18:39 PM By: [name] Schwalbe RN Entered By: [name] [name] on 03/18/2023 09:38:08 -------------------------------------------------------------------------------- Wound Assessment Details Patient Name: Date of  Service: [name] [name] [name] RRIS M. 03/18/2023 9:30 A M Medical Record Number: [id number] Patient Account Number: [id number] Date of Birth/Sex: Treating RN: 11-07-1955 (67 y.o. M) Primary Care : [name] [name] Other Clinician: Referring : Treating /Extender: [name] [name] in Treatment: 12 Wound Status Wound Number: 5 Primary Diabetic Wound/Ulcer of the Lower Extremity Etiology: Wound Location: Right, Distal, Lateral Lower Leg Wound Status: Open Wounding Event: Blister Comorbid Anemia, Lymphedema, Hypertension, Cirrhosis , Type II Date Acquired:  02/19/2023 History: Diabetes Weeks Of Treatment: 3 Clustered Wound: Yes Photos [name] [name], [name] [name] ([id number]) [id number] Page 13 of 17 Wound Measurements Length: (cm) Width: (cm) Depth: (cm) Clustered Quantity: Area: (cm) Volume: (cm) 0.8 % Reduction in Area: 11.2% 0.9 % Reduction in Volume: 10.9% 0.1 Epithelialization: Small (1-33%) 4 Tunneling: No 0.565 Undermining: No 0.057 Wound Description Classification: Grade 1 Wound Margin: Distinct, outline attached Exudate Amount: Medium Exudate Type: Serosanguineous Exudate Color: red, brown Foul Odor After Cleansing: No Slough/Fibrino Yes Wound Bed Granulation Amount: Small (1-33%) Exposed Structure Granulation Quality: Pink Fascia Exposed: No Necrotic Amount: Large (67-100%) Fat Layer (Subcutaneous Tissue) Exposed: Yes Tendon Exposed: No Muscle Exposed: No Joint Exposed: No Bone Exposed: No Periwound Skin Texture Texture Color No Abnormalities Noted: No No Abnormalities Noted: No Callus: No Atrophie Blanche: No Crepitus: No Cyanosis: No Excoriation: No Ecchymosis: No Induration: No Erythema: No Rash: No Hemosiderin Staining: No Scarring: No Mottled: No Pallor: No Moisture Rubor: No No Abnormalities Noted: No Dry / Scaly: No Temperature / Pain Maceration: No Temperature: No Abnormality Treatment Notes Wound #5 (Lower Leg) Wound Laterality: Right, Lateral, Distal Cleanser Soap and Water Discharge Instruction: May shower and wash wound with dial antibacterial soap and water prior to dressing change. Peri-Wound Care Triamcinolone 15 (g) Discharge Instruction: Use triamcinolone apply to entire both legs. around the wounds. Topical Primary Dressing Santyl Ointment Discharge Instruction: Apply nickel thick amount to wound bed as instructed Secondary Dressing Woven Gauze Sponge, Non-Sterile 4x4 in Discharge Instruction: Apply over primary dressing as directed. Secured  With [name] [name], [name] [name] ([id number]) [id number] Page 14 of 17 Kerlix Roll Sterile, 4.5x3.1 (in/yd) Discharge Instruction: Secure with Kerlix as directed. 30M Medipore [name] Soft Cloth Surgical T ape, 4 x 10 (in/yd) Discharge Instruction: Secure with tape as directed. Compression Wrap Compression Stockings Add-Ons Electronic Signature(s) Signed: 03/18/2023 12:36:03 PM By: Geralyn Corwin DO Entered By: Geralyn Corwin on 03/18/2023 11:01:20 -------------------------------------------------------------------------------- Wound Assessment Details Patient Name: Date of Service: [name] [name] [name] RRIS M. 03/18/2023 9:30 A M Medical Record Number: [id number] Patient Account Number: [id number] Date of Birth/Sex: Treating RN: [birth year] (67 y.o. [name] [name] Primary Care : [name] [name] Other Clinician: Referring : Treating /Extender: [name] [name] in Treatment: 12 Wound Status Wound Number: 6 Primary Diabetic Wound/Ulcer of the Lower Extremity Etiology: Wound Location: Left, Proximal, Posterior Lower Leg Wound Status: Open Wounding Event: Blister Comorbid Anemia, Lymphedema, Hypertension, Cirrhosis , Type II Date Acquired: 02/19/2023 History: Diabetes Weeks Of Treatment: 3 Clustered Wound: No Photos Wound Measurements Length: (cm) 1.5 Width: (cm) 1 Depth: (cm) 0.1 Area: (cm) 1.178 Volume: (cm) 0.118 % Reduction in Area: 28.6% % Reduction in Volume: 28.5% Epithelialization: Small (1-33%) Tunneling: No Undermining: No Wound Description Classification: Grade 1 Wound Margin: Distinct, outline attached Exudate Amount: Medium Exudate Type: Serosanguineous Exudate Color: red, brown Foul Odor After Cleansing: No Slough/Fibrino Yes Wound Bed Granulation Amount: Small (1-33%) Exposed Structure Granulation Quality: Pink Fascia Exposed: No Necrotic Amount: Large (67-100%) Fat Layer (Subcutaneous Tissue) Exposed:  Yes Necrotic Quality: Adherent Slough Tendon Exposed: No Muscle Exposed: No [name] [name], [name] [name] ([id number]) [id number] Page 15 of 17 Joint Exposed: No Bone Exposed: No Periwound Skin Texture Texture Color No Abnormalities Noted: No No Abnormalities Noted: No Callus: No Atrophie Blanche: No Crepitus: No Cyanosis: No Excoriation: No Ecchymosis: No Induration: No Erythema: No Rash: No Hemosiderin Staining: No Scarring: No Mottled: No Pallor: No Moisture Rubor: No No Abnormalities Noted: No Dry / Scaly: No Temperature / Pain Maceration: No Temperature: No Abnormality Treatment Notes Wound #6 (Lower Leg) Wound Laterality: Left, Posterior, Proximal Cleanser Soap and Water Discharge Instruction: May shower and wash wound with dial antibacterial soap and water prior to dressing change. Peri-Wound Care Triamcinolone 15 (g) Discharge Instruction: Use triamcinolone apply to entire both legs. around the wounds. Topical Primary Dressing Santyl Ointment Discharge Instruction: Apply nickel thick amount to wound bed as instructed Secondary Dressing Woven Gauze Sponge, Non-Sterile 4x4 in Discharge Instruction: Apply over primary dressing as directed. Secured With [employer], 4.5x3.1 (in/yd) Discharge Instruction: Secure with Kerlix as directed. 57M Medipore [name] Soft Cloth Surgical T ape, 4 x 10 (in/yd) Discharge Instruction: Secure with tape as directed. Compression Wrap Compression Stockings Add-Ons Electronic Signature(s) Signed: 03/18/2023 2:18:39 PM By: [name] Schwalbe RN Entered By: [name] [name] on 03/18/2023 09:40:28 -------------------------------------------------------------------------------- Wound Assessment Details Patient Name: Date of Service: [name] [name] [name] RRIS M. 03/18/2023 9:30 A M Medical Record Number: [id number] Patient Account Number: [id number] Date of Birth/Sex: Treating RN: 08/09/1955 (67 y.o. [name] [name] Primary  Care : [name] [name] Other Clinician: Referring : Treating /Extender: Lanney Gins Weeks in Treatment: 12 Wound Status Wound Number: 7 Primary Diabetic Wound/Ulcer of the Lower Extremity Etiology: Wound Location: Left, Distal, Posterior Lower Leg FATHI, DESCHEPPER ([id number]) [id number] Page 16 of 17 Wound Status: Open Wounding Event: Blister Comorbid Anemia, Lymphedema, Hypertension, Cirrhosis , Type II Date Acquired: 02/19/2023 History: Diabetes Weeks Of Treatment: 3 Clustered Wound: No Photos Wound Measurements Length: (cm) 0.4 Width: (cm) 0.4 Depth: (cm) 0.1 Area: (cm) 0.126 Volume: (cm) 0.013 % Reduction in Area: 79.9% % Reduction in Volume: 79.4% Epithelialization: Small (1-33%) Tunneling: No Undermining: No Wound Description Classification: Grade 1 Wound Margin: Distinct, outline attached Exudate Amount: Medium Exudate Type: Serosanguineous Exudate Color: red, brown Foul Odor After Cleansing: No Slough/Fibrino Yes Wound Bed Granulation Amount: Small (1-33%) Exposed Structure Granulation Quality: Red, Pink Fascia Exposed: No Necrotic Amount: Large (67-100%) Fat Layer (Subcutaneous Tissue) Exposed: Yes Necrotic Quality: Eschar, Adherent Slough Tendon Exposed: No Muscle Exposed: No Joint Exposed: No Bone Exposed: No Periwound Skin Texture Texture Color No Abnormalities Noted: No No Abnormalities Noted: No Callus: No Atrophie Blanche: No Crepitus: No Cyanosis: No Excoriation: No Ecchymosis: No Induration: No Erythema: No Rash: No Hemosiderin Staining: No Scarring: No Mottled: No Pallor: No Moisture Rubor: No No Abnormalities Noted: No Dry / Scaly: No Temperature / Pain Maceration: No Temperature: No Abnormality Treatment Notes Wound #7 (Lower Leg) Wound Laterality: Left, Posterior, Distal Cleanser Soap and Water Discharge Instruction: May shower and wash wound with dial  antibacterial soap and water prior to dressing change. Peri-Wound Care Triamcinolone 15 (g) Discharge Instruction: Use triamcinolone apply to entire both legs. around the wounds. Topical Primary Dressing Santyl Ointment Discharge Instruction: Apply nickel thick amount to wound bed as instructed LEDGER, SILERIO ([id number]) [id number] Page 17 of 17 Secondary Dressing Woven Gauze Sponge, Non-Sterile 4x4 in Discharge Instruction: Apply over primary dressing as directed. Secured With [employer], 4.5x3.1 (in/yd) Discharge Instruction:  Secure with Kerlix as directed. 78M Medipore [name] Soft Cloth Surgical T ape, 4 x 10 (in/yd) Discharge Instruction: Secure with tape as directed. Compression Wrap Compression Stockings Add-Ons Electronic Signature(s) Signed: 03/18/2023 2:18:39 PM By: [name] Schwalbe RN Entered By: [name] [name] on 03/18/2023 09:41:04 -------------------------------------------------------------------------------- Vitals Details Patient Name: Date of Service: [name] [name], [name] 03/18/2023 9:30 A M Medical Record Number: [id number] Patient Account Number: [id number] Date of Birth/Sex: Treating RN: [birth year] (67 y.o. [name] [name] Primary Care : [name] [name] Other Clinician: Referring : Treating /Extender: [name] [name] in Treatment: 12 Vital Signs Time Taken: 09:25 Temperature (F): 98.1 Height (in): 72 Pulse (bpm): 110 Weight (lbs): 245 Respiratory Rate (breaths/min): 18 Body Mass Index (BMI): 33.2 Blood Pressure (mmHg): 152/67 Reference Range: 80 - 120 mg / dl Electronic Signature(s) Signed: 03/18/2023 2:18:39 PM By: [name] Schwalbe RN Entered By: [name] [name] on 03/18/2023 09:25:45

## 2023-03-21 ENCOUNTER — Other Ambulatory Visit (HOSPITAL_COMMUNITY): Payer: Self-pay | Admitting: Internal Medicine

## 2023-03-21 ENCOUNTER — Ambulatory Visit (HOSPITAL_COMMUNITY)
Admission: RE | Admit: 2023-03-21 | Discharge: 2023-03-21 | Disposition: A | Discharge status: Home / Self Care | Payer: Medicare Other | Source: Ambulatory Visit | Attending: Vascular Surgery | Admitting: Vascular Surgery

## 2023-03-21 DIAGNOSIS — S91302A Unspecified open wound, left foot, initial encounter: Secondary | ICD-10-CM

## 2023-03-25 ENCOUNTER — Encounter (HOSPITAL_BASED_OUTPATIENT_CLINIC_OR_DEPARTMENT_OTHER): Payer: Medicare Other | Admitting: Internal Medicine

## 2023-03-25 DIAGNOSIS — I87313 Chronic venous hypertension (idiopathic) with ulcer of bilateral lower extremity: Secondary | ICD-10-CM | POA: Diagnosis not present

## 2023-03-25 DIAGNOSIS — E11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 other skin ulcer: Secondary | ICD-10-CM

## 2023-03-25 DIAGNOSIS — L97818 Non-pressure chronic ulcer of other part of right lower leg with other specified severity: Secondary | ICD-10-CM

## 2023-03-25 DIAGNOSIS — L97828 Non-pressure chronic ulcer of other part of left lower leg with other specified severity: Secondary | ICD-10-CM

## 2023-03-26 ENCOUNTER — Encounter (HOSPITAL_COMMUNITY): Payer: Medicare Other

## 2023-04-02 ENCOUNTER — Ambulatory Visit (HOSPITAL_BASED_OUTPATIENT_CLINIC_OR_DEPARTMENT_OTHER): Payer: Medicare Other | Admitting: Internal Medicine

## 2023-04-02 NOTE — Progress Notes (Signed)
MARQUEL, DENT ([id number]) [id number] Page 1 of 13 Visit Report for 03/25/2023 Chief Complaint Document Details Patient Name: Date of Service: [name] [name]. 03/25/2023 9:30 A M Medical Record Number: [id number] Patient Account Number: [id number] Date of Birth/Sex: Treating RN: [birth year] (67 y.o. M) Primary Care Provider: Cranford Mon Other Clinician: Referring Provider: Treating Provider/Extender: Shaune Pollack in Treatment: 13 Information Obtained from: Patient Chief Complaint 12/24/2022; bilateral lower extremity wounds Electronic Signature(s) Signed: 03/25/2023 1:34:39 PM By: Geralyn Corwin DO Entered By: Geralyn Corwin on 03/25/2023 10:43:29 -------------------------------------------------------------------------------- Debridement Details Patient Name: Date of Service: [name] [name], [name] Barry RRIS M. 03/25/2023 9:30 A M Medical Record Number: [id number] Patient Account Number: [id number] Date of Birth/Sex: Treating RN: [name] [name], [name] [name] (67 y.o. [name] [name] Primary Care Provider: Cranford Mon Other Clinician: Referring Provider: Treating Provider/Extender: Shaune Pollack in Treatment: 13 Debridement Performed for Assessment: Wound #5 Right,Distal,Lateral Lower Leg Performed By: Physician Geralyn Corwin, DO Debridement Type: Debridement Severity of Tissue Pre Debridement: Fat layer exposed Level of Consciousness (Pre-procedure): Awake and Alert Pre-procedure Verification/Time Out Yes - 10:20 Taken: Start Time: 10:22 Pain Control: Lidocaine 4% T opical Solution Percent of Wound Bed Debrided: 100% T Area Debrided (cm): otal 0.24 Tissue and other material debrided: Viable, Non-Viable, Slough, Slough Level: Non-Viable Tissue Debridement Description: Selective/Open Wound Instrument: Curette Bleeding: Minimum Hemostasis Achieved: Pressure End Time: 10:25 Procedural Pain: 0 Post Procedural Pain:  0 Response to Treatment: Procedure was tolerated well Level of Consciousness (Post- Awake and Alert procedure): Post Debridement Measurements of Total Wound Length: (cm) 0.5 Width: (cm) 0.6 Depth: (cm) 0.1 Volume: (cm) 0.024 Character of Wound/Ulcer Post Debridement: Improved [name] [name], [name] [name] ([id number]) [id number] Page 2 of 13 Severity of Tissue Post Debridement: Fat layer exposed Post Procedure Diagnosis Same as Pre-procedure Notes Scribed for Dr Mikey Bussing by Brenton Grills RN Electronic Signature(s) Signed: 03/25/2023 1:34:39 PM By: Geralyn Corwin DO Signed: 04/02/2023 2:03:55 PM By: Brenton Grills Entered By: Brenton Grills on 03/25/2023 10:21:47 -------------------------------------------------------------------------------- Debridement Details Patient Name: Date of Service: [name] [name] RRIS M. 03/25/2023 9:30 A M Medical Record Number: [id number] Patient Account Number: [id number] Date of Birth/Sex: Treating RN: [birth year] (67 y.o. [name] [name] Primary Care Provider: Cranford Mon Other Clinician: Referring Provider: Treating Provider/Extender: Shaune Pollack in Treatment: 13 Debridement Performed for Assessment: Wound #3 Right,Proximal,Lateral Lower Leg Performed By: Physician Geralyn Corwin, DO Debridement Type: Debridement Severity of Tissue Pre Debridement: Fat layer exposed Level of Consciousness (Pre-procedure): Awake and Alert Pre-procedure Verification/Time Out Yes - 10:20 Taken: Start Time: 10:22 Pain Control: Lidocaine 4% T opical Solution Percent of Wound Bed Debrided: 100% T Area Debrided (cm): otal 0.24 Tissue and other material debrided: Viable, Non-Viable, Slough, Slough Level: Non-Viable Tissue Debridement Description: Selective/Open Wound Instrument: Curette Bleeding: Minimum Hemostasis Achieved: Pressure End Time: 10:25 Procedural Pain: 0 Post Procedural Pain: 0 Response to Treatment:  Procedure was tolerated well Level of Consciousness (Post- Awake and Alert procedure): Post Debridement Measurements of Total Wound Length: (cm) 0.6 Width: (cm) 0.5 Depth: (cm) 0.1 Volume: (cm) 0.024 Character of Wound/Ulcer Post Debridement: Improved Severity of Tissue Post Debridement: Fat layer exposed Post Procedure Diagnosis Same as Pre-procedure Notes Scribed for Dr Mikey Bussing by Brenton Grills RN Electronic Signature(s) Signed: 03/25/2023 1:34:39 PM By: Geralyn Corwin DO Signed: 04/02/2023 2:03:55 PM By: Brenton Grills Entered By: Brenton Grills on 03/25/2023 10:26:47 [name] [name] ([id number]) [id number] Page 3 of 13 -------------------------------------------------------------------------------- Debridement Details Patient Name: Date of  Service: [name] [name] RRIS M. 03/25/2023 9:30 A M Medical Record Number: [id number] Patient Account Number: [id number] Date of Birth/Sex: Treating RN: [birth year]/[name] [name] (67 y.o. [name] [name] Primary Care Provider: Cranford Mon Other Clinician: Referring Provider: Treating Provider/Extender: Shaune Pollack in Treatment: 13 Debridement Performed for Assessment: Wound #4 Left,Distal,Medial Lower Leg Performed By: Physician Geralyn Corwin, DO Debridement Type: Debridement Severity of Tissue Pre Debridement: Fat layer exposed Level of Consciousness (Pre-procedure): Awake and Alert Pre-procedure Verification/Time Out Yes - 10:20 Taken: Start Time: 10:22 Pain Control: Lidocaine 4% T opical Solution Percent of Wound Bed Debrided: 100% T Area Debrided (cm): otal 0.01 Tissue and other material debrided: Viable, Non-Viable, Slough, Slough Level: Non-Viable Tissue Debridement Description: Selective/Open Wound Instrument: Curette Bleeding: Minimum Hemostasis Achieved: Pressure End Time: 10:25 Procedural Pain: 0 Post Procedural Pain: 0 Response to Treatment: Procedure was tolerated well Level  of Consciousness (Post- Awake and Alert procedure): Post Debridement Measurements of Total Wound Length: (cm) 0.1 Width: (cm) 0.1 Depth: (cm) 0.1 Volume: (cm) 0.001 Character of Wound/Ulcer Post Debridement: Improved Severity of Tissue Post Debridement: Fat layer exposed Post Procedure Diagnosis Same as Pre-procedure Notes Scribed for Dr Mikey Bussing by Brenton Grills RN Electronic Signature(s) Signed: 03/25/2023 1:34:39 PM By: Geralyn Corwin DO Signed: 04/02/2023 2:03:55 PM By: Brenton Grills Entered By: Brenton Grills on 03/25/2023 10:27:29 -------------------------------------------------------------------------------- Debridement Details Patient Name: Date of Service: [name] [name] RRIS M. 03/25/2023 9:30 A M Medical Record Number: [id number] Patient Account Number: [id number] Date of Birth/Sex: Treating RN: [birth year] [name] [name] (67 y.o. [name] [name] Primary Care Provider: Cranford Mon Other Clinician: Referring Provider: Treating Provider/Extender: Shaune Pollack in Treatment: 13 Debridement Performed for Assessment: Wound #6 Left,Proximal,Posterior Lower Leg Performed By: Physician Geralyn Corwin, DO [name] [name], [name] [name] ([id number]) [id number] Page 4 of 13 Debridement Type: Debridement Severity of Tissue Pre Debridement: Fat layer exposed Level of Consciousness (Pre-procedure): Awake and Alert Pre-procedure Verification/Time Out Yes - 10:20 Taken: Start Time: 10:22 Pain Control: Lidocaine 4% T opical Solution Percent of Wound Bed Debrided: 100% T Area Debrided (cm): otal 1.1 Tissue and other material debrided: Viable, Non-Viable, Slough, Slough Level: Non-Viable Tissue Debridement Description: Selective/Open Wound Instrument: Curette Bleeding: Minimum Hemostasis Achieved: Pressure End Time: 10:25 Procedural Pain: 0 Post Procedural Pain: 0 Response to Treatment: Procedure was tolerated well Level of Consciousness (Post- Awake  and Alert procedure): Post Debridement Measurements of Total Wound Length: (cm) 1 Width: (cm) 1.4 Depth: (cm) 0.1 Volume: (cm) 0.11 Character of Wound/Ulcer Post Debridement: Improved Severity of Tissue Post Debridement: Fat layer exposed Post Procedure Diagnosis Same as Pre-procedure Notes Scribed for Dr Mikey Bussing by Brenton Grills RN Electronic Signature(s) Signed: 03/25/2023 1:34:39 PM By: Geralyn Corwin DO Signed: 04/02/2023 2:03:55 PM By: Brenton Grills Entered By: Brenton Grills on 03/25/2023 10:28:07 -------------------------------------------------------------------------------- HPI Details Patient Name: Date of Service: [name] [name], [name] 03/25/2023 9:30 A M Medical Record Number: [id number] Patient Account Number: [id number] Date of Birth/Sex: Treating RN: [birth year]/[name] [name] (67 y.o. M) Primary Care Provider: Cranford Mon Other Clinician: Referring Provider: Treating Provider/Extender: Shaune Pollack in Treatment: 13 History of Present Illness HPI Description: 12/24/2022 Mr. Rosell Fasolino is a 67 year old male with a past medical history of controlled type 2 diabetes on insulin, lymphedema/chronic venous insufficiency and cirrhosis that presents to the clinic for a 1 year history of nonhealing wounds to his lower extremities bilaterally. He reports the starting spontaneously. He states that he has been following with [facility] wound care  center and they have been using [facility] antibiotic spray to the legs. He is not wearing compression stockings or wraps. He is also tried Medihoney and collagen in the past with little benefit. He currently denies systemic signs of infection. 5/30; patient presents for follow-up. He has been using triamcinolone cream to the periwound and Santyl to the wound beds. There has been improvement in wound healing. He denies signs of infection and has no issues or complaints today. 6/4; both legs look considerably better. He has a wound on  the right lateral lower leg and the left medial lower leg. Significant hemosiderin in the right leg less so on the left. We have been using Hydrofera Blue under compression 6/13; patient presents for follow-up. We have been using antibiotic ointment with Hydrofera Blue under compression therapy. The wounds are smaller. He has no issues or complaints today. We discussed ordering juxta lite compression garment wraps T use once his wounds heal as he has hard time putting on o compression stockings. He was agreeable with this. 6/20; patient presents for follow-up. We have been using antibiotic ointment with Hydrofera Blue under compression therapy to the lower extremities bilaterally. [name] [name], [name] [name] ([id number]) [id number] Page 5 of 13 Wounds are smaller. He has been approved for PuraPly and was agreeable with placement today. He denies signs of infection. 6/27; patient presents for follow-up. We have been using antibiotic ointment with Hydrofera Blue under compression therapy to the left lower extremity. This wound is healed. He has his juxta lite compression wraps with him today. T the right lateral leg we have been using PuraPly under compression and that wound o is smaller today. Unfortunately he has developed skin breakdown from the Steri-Strip that was used to hold the PuraPly in place and this has created a new wound on the right leg. 7/8; patient presents for follow-up. We have been using PuraPly to the right lateral leg wound Under compression wrap. Wound is slightly smaller however original wound used for PuraPly is healed. He unfortunately developed a new wound to the left lower extremity but it looks like this was from potential scratching. He has been using his juxta lite compression here. 716; patient presents for follow-up. We have been using antibiotic ointment with Hydrofera Blue under 3 layer compression to lower extremities bilaterally. Unfortunately he  has continued to develop wounds to the left lower extremity. He currently denies signs of infection. 7/22; patient presents for follow-up. He has been using Santyl and Hydrofera Blue with triamcinolone cream under her juxta lite compression wraps daily. No new wounds today. Current wounds are stable. Less irritation to the periwound. 7/29; patient presents for follow-up. He has been using Santyl and Hydrofera Blue with triamcinolone cream under the juxta lite compression wraps. He is having a hard time keeping the [facility] in place to the wound beds. He denies signs of infection. 8/5; patient presents for follow-up. Has been using Santyl to the wound beds and triamcinolone cream under the juxta lite compression. He has no issues or complaints today. Wounds are overall stable. 8/12; patient presents for follow-up. He has been using Santyl to the wound beds and triamcinolone cream to the periwound under JuxtaLite compression. He is scheduled to see dermatology this week. Wounds are overall stable. 8/19; patient presents for follow-up. He has been using Santyl and PolyMem to the wound beds along with triamcinolone cream to the periwound under his juxta light compression. He saw dermatology and they obtained a biopsy of the distal medial  wound on the left leg. He also had venous reflux studies last week that showed no venous reflux on the left and only venous reflux to the CFV with no venous reflux throughout the rest of the venous system. Electronic Signature(s) Signed: 03/25/2023 1:34:39 PM By: Geralyn Corwin DO Entered By: Geralyn Corwin on 03/25/2023 10:44:39 -------------------------------------------------------------------------------- Physical Exam Details Patient Name: Date of Service: [name] [name], [name] 03/25/2023 9:30 A M Medical Record Number: [id number] Patient Account Number: [id number] Date of Birth/Sex: Treating RN: [name] 17, [name] [name] (67 y.o. M) Primary Care Provider: Cranford Mon  Other Clinician: Referring Provider: Treating Provider/Extender: Shaune Pollack in Treatment: 13 Constitutional respirations regular, non-labored and within target range for patient.. Cardiovascular 2+ dorsalis pedis/posterior tibialis pulses. Psychiatric pleasant and cooperative. Notes Right lower extremity: several small scattered open wound with non viable tissue. No surrounding signs of infection. Venous stasis dermatitis. Left lower extremity: 2 open wounds with granulation tissue and nonviable tissue. The left distal posterior wound is healed. Again no signs of surrounding infection. Venous stasis dermatitis. Decent edema control. Electronic Signature(s) Signed: 03/25/2023 1:34:39 PM By: Geralyn Corwin DO Entered By: Geralyn Corwin on 03/25/2023 10:45:56 Physician Orders Details -------------------------------------------------------------------------------- [name] [name] ([id number]) [id number] Page 6 of 13 Patient Name: Date of Service: [name] [name] [name]. 03/25/2023 9:30 A M Medical Record Number: [id number] Patient Account Number: [id number] Date of Birth/Sex: Treating RN: [birth year]/20/[name] [name] (67 y.o. [name] [name] Primary Care Provider: Cranford Mon Other Clinician: Referring Provider: Treating Provider/Extender: Shaune Pollack in Treatment: 14 Verbal / Phone Orders: No Diagnosis Coding Follow-up Appointments ppointment in 2 weeks. - Dr Mikey Bussing Return A Other: - Upcoming Dermatology appointment next Wednesday Anesthetic (In clinic) Topical Lidocaine 4% applied to wound bed Bathing/ Shower/ Hygiene May shower with protection but do not get wound dressing(s) wet. Protect dressing(s) with water repellant cover (for example, large plastic bag) or a cast cover and may then take shower. Edema Control - Lymphedema / SCD / Other Elevate legs to the level of the heart or above for 30 minutes daily and/or  when sitting for 3-4 times a day throughout the day. Avoid standing for long periods of time. Patient to wear own compression stockings every day. Exercise regularly Compression stocking or Garment 30-40 mm/Hg pressure to: - wear juxatlite HDs both legs. Wound Treatment Wound #3 - Lower Leg Wound Laterality: Right, Lateral, Proximal Cleanser: Soap and Water 1 x Per Day/[name] Days Discharge Instructions: May shower and wash wound with dial antibacterial soap and water prior to dressing change. Peri-Wound Care: Triamcinolone [name] (g) 1 x Per Day/[name] Days Discharge Instructions: Use triamcinolone apply to entire both legs. around the wounds. Prim Dressing: PolyMem Silver Non-Adhesive Dressing, 4.25x4.25 in 1 x Per Day/[name] Days ary Discharge Instructions: Apply to wound bed as instructed Prim Dressing: Santyl Ointment 1 x Per Day/[name] Days ary Discharge Instructions: Apply nickel thick amount to wound bed as instructed Secondary Dressing: Woven Gauze Sponge, Non-Sterile 4x4 in (Generic) 1 x Per Day/[name] Days Discharge Instructions: Apply over primary dressing as directed. Secured With: [employer], 4.5x3.1 (in/yd) (Generic) 1 x Per Day/[name] Days Discharge Instructions: Secure with Kerlix as directed. Secured With: 62M Medipore H Soft Cloth Surgical T ape, 4 x 10 (in/yd) (Generic) 1 x Per Day/[name] Days Discharge Instructions: Secure with tape as directed. Wound #4 - Lower Leg Wound Laterality: Left, Medial, Distal Cleanser: Soap and Water 1 x Per Day/[name] Days Discharge Instructions: May shower and wash  wound with dial antibacterial soap and water prior to dressing change. Peri-Wound Care: Triamcinolone [name] (g) 1 x Per Day/[name] Days Discharge Instructions: Use triamcinolone apply to entire both legs. around the wounds. Prim Dressing: PolyMem Silver Non-Adhesive Dressing, 4.25x4.25 in 1 x Per Day/[name] Days ary Discharge Instructions: Apply to wound bed as instructed Prim Dressing: Santyl Ointment 1 x Per  Day/[name] Days ary Discharge Instructions: Apply nickel thick amount to wound bed as instructed Secondary Dressing: Woven Gauze Sponge, Non-Sterile 4x4 in (Generic) 1 x Per Day/[name] Days Discharge Instructions: Apply over primary dressing as directed. Secured With: [employer], 4.5x3.1 (in/yd) (Generic) 1 x Per Day/[name] Days Discharge Instructions: Secure with Kerlix as directed. Secured With: 81M Medipore H Soft Cloth Surgical T ape, 4 x 10 (in/yd) (Generic) 1 x Per Day/[name] Days Discharge Instructions: Secure with tape as directed. Wound #5 - Lower Leg Wound Laterality: Right, Lateral, Distal Cleanser: Soap and Water 1 x Per Day/[name] Days Discharge Instructions: May shower and wash wound with dial antibacterial soap and water prior to dressing change. [name] [name], [name] [name] ([id number]) [id number] Page 7 of 13 Peri-Wound Care: Triamcinolone [name] (g) 1 x Per Day/[name] Days Discharge Instructions: Use triamcinolone apply to entire both legs. around the wounds. Prim Dressing: PolyMem Silver Non-Adhesive Dressing, 4.25x4.25 in 1 x Per Day/[name] Days ary Discharge Instructions: Apply to wound bed as instructed Prim Dressing: Santyl Ointment 1 x Per Day/[name] Days ary Discharge Instructions: Apply nickel thick amount to wound bed as instructed Secondary Dressing: Woven Gauze Sponge, Non-Sterile 4x4 in (Generic) 1 x Per Day/[name] Days Discharge Instructions: Apply over primary dressing as directed. Secured With: [employer], 4.5x3.1 (in/yd) (Generic) 1 x Per Day/[name] Days Discharge Instructions: Secure with Kerlix as directed. Secured With: 81M Medipore H Soft Cloth Surgical T ape, 4 x 10 (in/yd) (Generic) 1 x Per Day/[name] Days Discharge Instructions: Secure with tape as directed. Wound #6 - Lower Leg Wound Laterality: Left, Posterior, Proximal Cleanser: Soap and Water 1 x Per Day/[name] Days Discharge Instructions: May shower and wash wound with dial antibacterial soap and water prior to  dressing change. Peri-Wound Care: Triamcinolone [name] (g) 1 x Per Day/[name] Days Discharge Instructions: Use triamcinolone apply to entire both legs. around the wounds. Prim Dressing: PolyMem Silver Non-Adhesive Dressing, 4.25x4.25 in 1 x Per Day/[name] Days ary Discharge Instructions: Apply to wound bed as instructed Prim Dressing: Santyl Ointment 1 x Per Day/[name] Days ary Discharge Instructions: Apply nickel thick amount to wound bed as instructed Secondary Dressing: Woven Gauze Sponge, Non-Sterile 4x4 in (Generic) 1 x Per Day/[name] Days Discharge Instructions: Apply over primary dressing as directed. Secured With: [employer], 4.5x3.1 (in/yd) (Generic) 1 x Per Day/[name] Days Discharge Instructions: Secure with Kerlix as directed. Secured With: 81M Medipore H Soft Cloth Surgical T ape, 4 x 10 (in/yd) (Generic) 1 x Per Day/[name] Days Discharge Instructions: Secure with tape as directed. Electronic Signature(s) Signed: 03/25/2023 1:34:39 PM By: Geralyn Corwin DO Entered By: Geralyn Corwin on 03/25/2023 10:46:06 -------------------------------------------------------------------------------- Problem List Details Patient Name: Date of Service: [name] [name], [name] 03/25/2023 9:30 A M Medical Record Number: [id number] Patient Account Number: [id number] Date of Birth/Sex: Treating RN: Oct [birth year], [name] [name] (67 y.o. 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 12/24/2022 No Yes severity KEDWIN, WHITECOTTON ([id number]) [id number].pdf Page 8 of 13 [phone] Non-pressure chronic ulcer of other part of  left lower leg with other specified 12/24/2022 No Yes severity I89.0 Lymphedema, not elsewhere classified 12/24/2022 No Yes E11.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 other skin ulcer 12/24/2022 No Yes K74.69 Other cirrhosis of liver 12/24/2022 No Yes Inactive Problems Resolved Problems Electronic Signature(s) Signed: 03/25/2023 1:34:39 PM By: Geralyn Corwin DO Entered By: Geralyn Corwin on 03/25/2023 10:42:54 -------------------------------------------------------------------------------- Progress Note Details Patient Name: Date of Service: [name] [name], [name] 03/25/2023 9:30 A M Medical Record Number: [id number] Patient Account Number: [id number] Date of Birth/Sex: Treating RN: 30-May-[name] [name] (67 y.o. M) Primary Care Provider: Cranford Mon Other Clinician: Referring Provider: Treating Provider/Extender: Shaune Pollack in Treatment: 13 Subjective Chief Complaint Information obtained from Patient 12/24/2022; bilateral lower extremity wounds History of Present Illness (HPI) 12/24/2022 Mr. Yianni Vroman is a 67 year old male with a past medical history of controlled type 2 diabetes on insulin, lymphedema/chronic venous insufficiency and cirrhosis that presents to the clinic for a 1 year history of nonhealing wounds to his lower extremities bilaterally. He reports the starting spontaneously. He states that he has been following with [facility] and they have been using [facility] antibiotic spray to the legs. He is not wearing compression stockings or wraps. He is also tried Medihoney and collagen in the past with little benefit. He currently denies systemic signs of infection. 5/30; patient presents for follow-up. He has been using triamcinolone cream to the periwound and Santyl to the wound beds. There has been improvement in wound healing. He denies signs of infection and has no issues or complaints today. 6/4; both legs look considerably better. He has a wound on the  right lateral lower leg and the left medial lower leg. Significant hemosiderin in the right leg less so on the left. We have been using Hydrofera Blue under compression 6/13; patient presents for follow-up. We have been using antibiotic ointment with Hydrofera Blue under compression therapy. The wounds are smaller. He has no issues or complaints today. We discussed ordering juxta lite compression garment wraps T use once his wounds heal as he has hard time putting on o compression stockings. He was agreeable with this. 6/20; patient presents for follow-up. We have been using antibiotic ointment with Hydrofera Blue under compression therapy to the lower extremities bilaterally. Wounds are smaller. He has been approved for PuraPly and was agreeable with placement today. He denies signs of infection. 6/27; patient presents for follow-up. We have been using antibiotic ointment with Hydrofera Blue under compression therapy to the left lower extremity. This wound is healed. He has his juxta lite compression wraps with him today. T the right lateral leg we have been using PuraPly under compression and that wound o is smaller today. Unfortunately he has developed skin breakdown from the Steri-Strip that was used to hold the PuraPly in place and this has created a new wound on the right leg. 7/8; patient presents for follow-up. We have been using PuraPly to the right lateral leg wound Under compression wrap. Wound is slightly smaller however original wound used for PuraPly is healed. He unfortunately developed a new wound to the left lower extremity but it looks like this was from potential scratching. He has been using his juxta lite compression here. ZAYDENN, ISGETT ([id number]) [id number] Page 9 of 13 716; patient presents for follow-up.  We have been using antibiotic ointment with Hydrofera Blue under 3 layer compression to lower extremities bilaterally. Unfortunately he has  continued to develop wounds to the left lower extremity. He currently denies signs of infection. 7/22; patient presents for follow-up. He has been using Santyl and Hydrofera Blue with triamcinolone cream under her juxta lite compression wraps daily. No new wounds today. Current wounds are stable. Less irritation to the periwound. 7/29; patient presents for follow-up. He has been using Santyl and Hydrofera Blue with triamcinolone cream under the juxta lite compression wraps. He is having a hard time keeping the [facility] in place to the wound beds. He denies signs of infection. 8/5; patient presents for follow-up. Has been using Santyl to the wound beds and triamcinolone cream under the juxta lite compression. He has no issues or complaints today. Wounds are overall stable. 8/12; patient presents for follow-up. He has been using Santyl to the wound beds and triamcinolone cream to the periwound under JuxtaLite compression. He is scheduled to see dermatology this week. Wounds are overall stable. 8/19; patient presents for follow-up. He has been using Santyl and PolyMem to the wound beds along with triamcinolone cream to the periwound under his juxta light compression. He saw dermatology and they obtained a biopsy of the distal medial wound on the left leg. He also had venous reflux studies last week that showed no venous reflux on the left and only venous reflux to the CFV with no venous reflux throughout the rest of the venous system. Patient History Family History Diabetes - Father, Heart Disease - Father. Social History Current every day smoker, Marital Status - Single, Alcohol Use - Never, Drug Use - No History, Caffeine Use - Never. Medical History Hematologic/Lymphatic Patient has history of Anemia, Lymphedema Cardiovascular Patient has history of Hypertension Gastrointestinal Patient has history of Cirrhosis - crpytogenic Endocrine Patient has history of Type II Diabetes - 6.2  HgbA1c Medical A Surgical History Notes nd Gastrointestinal GERD Genitourinary stage III CKD Objective Constitutional respirations regular, non-labored and within target range for patient.. Vitals Time Taken: 9:37 AM, Height: 72 in, Weight: 245 lbs, BMI: 33.2, Temperature: 97.7 F, Pulse: 47 bpm, Respiratory Rate: 16 breaths/min, Blood Pressure: 121/70 mmHg. Cardiovascular 2+ dorsalis pedis/posterior tibialis pulses. Psychiatric pleasant and cooperative. General Notes: Right lower extremity: several small scattered open wound with non viable tissue. No surrounding signs of infection. Venous stasis dermatitis. Left lower extremity: 2 open wounds with granulation tissue and nonviable tissue. The left distal posterior wound is healed. Again no signs of surrounding infection. Venous stasis dermatitis. Decent edema control. Integumentary (Hair, Skin) Wound #3 status is Open. Original cause of wound was Gradually Appeared. The date acquired was: 01/25/2023. The wound has been in treatment 7 weeks. The wound is located on the Right,Proximal,Lateral Lower Leg. The wound measures 0.6cm length x 0.5cm width x 0.1cm depth; 0.236cm^2 area and 0.024cm^3 volume. There is Fat Layer (Subcutaneous Tissue) exposed. There is no tunneling or undermining noted. There is a medium amount of serosanguineous drainage noted. The wound margin is distinct with the outline attached to the wound base. There is no granulation within the wound bed. There is a large (67- 100%) amount of necrotic tissue within the wound bed including Adherent Slough. The periwound skin appearance did not exhibit: Callus, Crepitus, Excoriation, Induration, Rash, Scarring, Dry/Scaly, Maceration, Atrophie Blanche, Cyanosis, Ecchymosis, Hemosiderin Staining, Mottled, Pallor, Rubor, Erythema. Periwound temperature was noted as No Abnormality. Wound #4 status is Open. Original cause of wound was Gradually  Appeared. The date acquired was: 02/11/2023.  The wound has been in treatment 6 weeks. The wound is located on the Left,Distal,Medial Lower Leg. The wound measures 0.1cm length x 0.1cm width x 0.1cm depth; 0.008cm^2 area and 0.001cm^3 volume. There is no tunneling or undermining noted. There is a medium amount of serosanguineous drainage noted. The wound margin is distinct with the outline attached to the wound base. There is no granulation within the wound bed. There is no necrotic tissue within the wound bed. The periwound skin appearance did not exhibit: Callus, Crepitus, Excoriation, Induration, Rash, Scarring, Dry/Scaly, Maceration, Atrophie Blanche, Cyanosis, Ecchymosis, Hemosiderin Staining, Mottled, Pallor, Rubor, Erythema. Periwound temperature was noted as No Abnormality. Wound #5 status is Open. Original cause of wound was Blister. The date acquired was: 02/19/2023. The wound has been in treatment 4 weeks. The wound is located on the Right,Distal,Lateral Lower Leg. The wound measures 0.5cm length x 0.6cm width x 0.1cm depth; 0.236cm^2 area and 0.024cm^3 volume. There MAKSYMILIAN, STEIMLE ([id number]) [id number] Page 10 of 13 is Fat Layer (Subcutaneous Tissue) exposed. There is no tunneling or undermining noted. There is a medium amount of serosanguineous drainage noted. The wound margin is distinct with the outline attached to the wound base. There is no granulation within the wound bed. There is a large (67-100%) amount of necrotic tissue within the wound bed including Adherent Slough. The periwound skin appearance did not exhibit: Callus, Crepitus, Excoriation, Induration, Rash, Scarring, Dry/Scaly, Maceration, Atrophie Blanche, Cyanosis, Ecchymosis, Hemosiderin Staining, Mottled, Pallor, Rubor, Erythema. Periwound temperature was noted as No Abnormality. Wound #6 status is Open. Original cause of wound was Blister. The date acquired was: 02/19/2023. The wound has been in treatment 4 weeks. The wound is located  on the Left,Proximal,Posterior Lower Leg. The wound measures 1cm length x 1.4cm width x 0.1cm depth; 1.1cm^2 area and 0.11cm^3 volume. There is Fat Layer (Subcutaneous Tissue) exposed. There is no tunneling or undermining noted. There is a medium amount of serosanguineous drainage noted. The wound margin is distinct with the outline attached to the wound base. There is small (1-33%) pink granulation within the wound bed. There is a large (67-100%) amount of necrotic tissue within the wound bed including Adherent Slough. The periwound skin appearance did not exhibit: Callus, Crepitus, Excoriation, Induration, Rash, Scarring, Dry/Scaly, Maceration, Atrophie Blanche, Cyanosis, Ecchymosis, Hemosiderin Staining, Mottled, Pallor, Rubor, Erythema. Periwound temperature was noted as No Abnormality. Wound #7 status is Healed - Epithelialized. Original cause of wound was Blister. The date acquired was: 02/19/2023. The wound has been in treatment 4 weeks. The wound is located on the Left,Distal,Posterior Lower Leg. The wound measures 0cm length x 0cm width x 0cm depth; 0cm^2 area and 0cm^3 volume. There is Fat Layer (Subcutaneous Tissue) exposed. There is no tunneling or undermining noted. There is a medium amount of serosanguineous drainage noted. The wound margin is distinct with the outline attached to the wound base. There is no granulation within the wound bed. There is a large (67-100%) amount of necrotic tissue within the wound bed. The periwound skin appearance did not exhibit: Callus, Crepitus, Excoriation, Induration, Rash, Scarring, Dry/Scaly, Maceration, Atrophie Blanche, Cyanosis, Ecchymosis, Hemosiderin Staining, Mottled, Pallor, Rubor, Erythema. Periwound temperature was noted as No Abnormality. 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 I debrided nonviable tissue. I recommended continuing the course with Santyl and PolyMem silver daily under compression Velcro wrap. Can use steroid cream as needed to the areas of irritation to the periwound. He had a punch biopsy done to the left leg by dermatology last week. He follow up with them next week. Venous reflux studies with no significant venous reflux. I will see him back in 2 weeks. Procedures Wound #3 Pre-procedure diagnosis of Wound #3 is a Diabetic Wound/Ulcer of the Lower Extremity located on the Right,Proximal,Lateral Lower Leg .Severity of Tissue Pre Debridement is: Fat layer exposed. There was a Selective/Open Wound Non-Viable Tissue Debridement with a total area of 0.24 sq cm performed by Geralyn Corwin, DO. With the following instrument(s): Curette to remove Viable and Non-Viable tissue/material. Material removed includes Slough after achieving pain control using Lidocaine 4% Topical Solution. No specimens were taken. A time out was conducted at 10:20, prior to the start of the procedure. A Minimum amount of bleeding was controlled with Pressure. The procedure was tolerated well with a pain level of 0 throughout and a pain level of 0 following the procedure. Post Debridement Measurements: 0.6cm length x 0.5cm width x 0.1cm depth; 0.024cm^3 volume. Character of Wound/Ulcer Post Debridement is improved. Severity of Tissue Post Debridement is: Fat layer exposed. Post procedure Diagnosis Wound #3: Same as Pre-Procedure General Notes: Scribed for Dr Mikey Bussing by Brenton Grills RN. Wound #4 Pre-procedure diagnosis of Wound #4 is a Diabetic Wound/Ulcer of the Lower Extremity located on the  Left,Distal,Medial Lower Leg .Severity of Tissue Pre Debridement is: Fat layer exposed. There was a Selective/Open Wound Non-Viable Tissue Debridement with a total area of 0.01 sq cm performed by Geralyn Corwin, DO. With the following instrument(s): Curette to remove Viable and Non-Viable tissue/material. Material removed includes Slough after achieving pain control using Lidocaine 4% Topical Solution. No specimens were taken. A time out was conducted at 10:20, prior to the start of the procedure. A Minimum amount of bleeding was controlled with Pressure. The procedure was tolerated well with a pain level of 0 throughout and a pain level of 0 following the procedure. Post Debridement Measurements: 0.1cm length x 0.1cm width x 0.1cm depth; 0.001cm^3 volume. Character of Wound/Ulcer Post Debridement is improved. Severity of Tissue Post Debridement is: Fat layer exposed. Post procedure Diagnosis Wound #4: Same as Pre-Procedure General Notes: Scribed for Dr Mikey Bussing by Brenton Grills RN. Wound #5 Pre-procedure diagnosis of Wound #5 is a Diabetic Wound/Ulcer of the Lower Extremity located on the Right,Distal,Lateral Lower Leg .Severity of Tissue Pre Debridement is: Fat layer exposed. There was a Selective/Open Wound Non-Viable Tissue Debridement with a total area of 0.24 sq cm performed by Geralyn Corwin, DO. With the following instrument(s): Curette to remove Viable and Non-Viable tissue/material. Material removed includes Slough after achieving pain control using Lidocaine 4% Topical Solution. No specimens were taken. A time out was conducted at 10:20, prior to the start of the procedure. A Minimum amount of bleeding was controlled with Pressure. The procedure was tolerated well with a pain level of 0 throughout and a pain level of 0 following the procedure. Post Debridement Measurements: 0.5cm length x 0.6cm width x 0.1cm depth; 0.024cm^3 volume. Character of Wound/Ulcer Post Debridement is improved.  Severity of Tissue Post Debridement is: Fat layer exposed. Post procedure Diagnosis Wound #5: Same as Pre-Procedure General Notes: Scribed for Dr Mikey Bussing  by Brenton Grills RN. Wound #6 Pre-procedure diagnosis of Wound #6 is a Diabetic Wound/Ulcer of the Lower Extremity located on the Left,Proximal,Posterior Lower Leg .Severity of Tissue Pre Debridement is: Fat layer exposed. There was a Selective/Open Wound Non-Viable Tissue Debridement with a total area of 1.1 sq cm performed by Geralyn Corwin, DO. With the following instrument(s): Curette to remove Viable and Non-Viable tissue/material. Material removed includes Slough after achieving pain control using Lidocaine 4% Topical Solution. No specimens were taken. A time out was conducted at 10:20, prior to the start of the procedure. A Minimum amount of bleeding was controlled with Pressure. The procedure was tolerated well with a pain level of 0 throughout and a pain level of 0 following the procedure. Post Debridement Measurements: 1cm length x 1.4cm width x 0.1cm depth; 0.11cm^3 volume. EUTIMIO, SHADDOX ([id number]) [id number] Page 11 of 13 Character of Wound/Ulcer Post Debridement is improved. Severity of Tissue Post Debridement is: Fat layer exposed. Post procedure Diagnosis Wound #6: Same as Pre-Procedure General Notes: Scribed for Dr Mikey Bussing by Brenton Grills RN. Plan Follow-up Appointments: Return Appointment in 2 weeks. - Dr Mikey Bussing Other: - Upcoming Dermatology appointment next Wednesday Anesthetic: (In clinic) Topical Lidocaine 4% applied to wound bed Bathing/ Shower/ Hygiene: May shower with protection but do not get wound dressing(s) wet. Protect dressing(s) with water repellant cover (for example, large plastic bag) or a cast cover and may then take shower. Edema Control - Lymphedema / SCD / Other: Elevate legs to the level of the heart or above for 30 minutes daily and/or when sitting for 3-4 times a  day throughout the day. Avoid standing for long periods of time. Patient to wear own compression stockings every day. Exercise regularly Compression stocking or Garment 30-40 mm/Hg pressure to: - wear juxatlite HDs both legs. WOUND #3: - Lower Leg Wound Laterality: Right, Lateral, Proximal Cleanser: Soap and Water 1 x Per Day/[name] Days Discharge Instructions: May shower and wash wound with dial antibacterial soap and water prior to dressing change. Peri-Wound Care: Triamcinolone [name] (g) 1 x Per Day/[name] Days Discharge Instructions: Use triamcinolone apply to entire both legs. around the wounds. Prim Dressing: PolyMem Silver Non-Adhesive Dressing, 4.25x4.25 in 1 x Per Day/[name] Days ary Discharge Instructions: Apply to wound bed as instructed Prim Dressing: Santyl Ointment 1 x Per Day/[name] Days ary Discharge Instructions: Apply nickel thick amount to wound bed as instructed Secondary Dressing: Woven Gauze Sponge, Non-Sterile 4x4 in (Generic) 1 x Per Day/[name] Days Discharge Instructions: Apply over primary dressing as directed. Secured With: [employer], 4.5x3.1 (in/yd) (Generic) 1 x Per Day/[name] Days Discharge Instructions: Secure with Kerlix as directed. Secured With: 21M Medipore H Soft Cloth Surgical T ape, 4 x 10 (in/yd) (Generic) 1 x Per Day/[name] Days Discharge Instructions: Secure with tape as directed. WOUND #4: - Lower Leg Wound Laterality: Left, Medial, Distal Cleanser: Soap and Water 1 x Per Day/[name] Days Discharge Instructions: May shower and wash wound with dial antibacterial soap and water prior to dressing change. Peri-Wound Care: Triamcinolone [name] (g) 1 x Per Day/[name] Days Discharge Instructions: Use triamcinolone apply to entire both legs. around the wounds. Prim Dressing: PolyMem Silver Non-Adhesive Dressing, 4.25x4.25 in 1 x Per Day/[name] Days ary Discharge Instructions: Apply to wound bed as instructed Prim Dressing: Santyl Ointment 1 x Per Day/[name] Days ary Discharge Instructions:  Apply nickel thick amount to wound bed as instructed Secondary Dressing: Woven Gauze Sponge, Non-Sterile 4x4 in (Generic) 1 x Per Day/[name] Days Discharge Instructions: Apply over  primary dressing as directed. Secured With: [employer], 4.5x3.1 (in/yd) (Generic) 1 x Per Day/[name] Days Discharge Instructions: Secure with Kerlix as directed. Secured With: 77M Medipore H Soft Cloth Surgical T ape, 4 x 10 (in/yd) (Generic) 1 x Per Day/[name] Days Discharge Instructions: Secure with tape as directed. WOUND #5: - Lower Leg Wound Laterality: Right, Lateral, Distal Cleanser: Soap and Water 1 x Per Day/[name] Days Discharge Instructions: May shower and wash wound with dial antibacterial soap and water prior to dressing change. Peri-Wound Care: Triamcinolone [name] (g) 1 x Per Day/[name] Days Discharge Instructions: Use triamcinolone apply to entire both legs. around the wounds. Prim Dressing: PolyMem Silver Non-Adhesive Dressing, 4.25x4.25 in 1 x Per Day/[name] Days ary Discharge Instructions: Apply to wound bed as instructed Prim Dressing: Santyl Ointment 1 x Per Day/[name] Days ary Discharge Instructions: Apply nickel thick amount to wound bed as instructed Secondary Dressing: Woven Gauze Sponge, Non-Sterile 4x4 in (Generic) 1 x Per Day/[name] Days Discharge Instructions: Apply over primary dressing as directed. Secured With: [employer], 4.5x3.1 (in/yd) (Generic) 1 x Per Day/[name] Days Discharge Instructions: Secure with Kerlix as directed. Secured With: 77M Medipore H Soft Cloth Surgical T ape, 4 x 10 (in/yd) (Generic) 1 x Per Day/[name] Days Discharge Instructions: Secure with tape as directed. WOUND #6: - Lower Leg Wound Laterality: Left, Posterior, Proximal Cleanser: Soap and Water 1 x Per Day/[name] Days Discharge Instructions: May shower and wash wound with dial antibacterial soap and water prior to dressing change. Peri-Wound Care: Triamcinolone [name] (g) 1 x Per Day/[name] Days Discharge Instructions: Use triamcinolone  apply to entire both legs. around the wounds. Prim Dressing: PolyMem Silver Non-Adhesive Dressing, 4.25x4.25 in 1 x Per Day/[name] Days ary Discharge Instructions: Apply to wound bed as instructed Prim Dressing: Santyl Ointment 1 x Per Day/[name] Days ary Discharge Instructions: Apply nickel thick amount to wound bed as instructed Secondary Dressing: Woven Gauze Sponge, Non-Sterile 4x4 in (Generic) 1 x Per Day/[name] Days Discharge Instructions: Apply over primary dressing as directed. Secured With: [employer], 4.5x3.1 (in/yd) (Generic) 1 x Per Day/[name] Days Discharge Instructions: Secure with Kerlix as directed. Secured With: 77M Medipore H Soft Cloth Surgical T ape, 4 x 10 (in/yd) (Generic) 1 x Per Day/[name] Days Discharge Instructions: Secure with tape as directed. 1. In office sharp debridement 2. PolyMem silver with Santyl under compression Velcro wrap daily 3. Follow-up in 2 weeks MAIJOR, GINES ([id number]) [id number] Page 12 of 13 Electronic Signature(s) Signed: 03/25/2023 1:34:39 PM By: Geralyn Corwin DO Entered By: Geralyn Corwin on 03/25/2023 10:54:33 -------------------------------------------------------------------------------- HxROS Details Patient Name: Date of Service: [name] [name], [name] 03/25/2023 9:30 A M Medical Record Number: [id number] Patient Account Number: [id number] Date of Birth/Sex: Treating RN: [birth year] (67 y.o. M) Primary Care Provider: Cranford Mon Other Clinician: Referring Provider: Treating Provider/Extender: Shaune Pollack in Treatment: 13 Hematologic/Lymphatic Medical History: Positive for: Anemia; Lymphedema Cardiovascular Medical History: Positive for: Hypertension Gastrointestinal Medical History: Positive for: Cirrhosis - crpytogenic Past Medical History Notes: GERD Endocrine Medical History: Positive for: Type II Diabetes - 6.2 HgbA1c Time with diabetes: 20 yeatrs Treated with:  Insulin Genitourinary Medical History: Past Medical History Notes: stage III CKD Immunizations Pneumococcal Vaccine: Received Pneumococcal Vaccination: No Implantable Devices No devices added Family and Social History Diabetes: Yes - Father; Heart Disease: Yes - Father; Current every day smoker; Marital Status - Single; Alcohol Use: Never; Drug Use: No History; Caffeine Use: Never; Financial Concerns: No; Food, Clothing or Shelter Needs:  No; Support System Lacking: No; Transportation Concerns: No Electronic Signature(s) Signed: 03/25/2023 1:34:39 PM By: Geralyn Corwin DO Entered By: Geralyn Corwin on 03/25/2023 10:44:45 [name] [name] ([id number]) [id number] Page 13 of 13 -------------------------------------------------------------------------------- SuperBill Details Patient Name: Date of Service: [name] [name] [name] 03/25/2023 Medical Record Number: [id number] Patient Account Number: [id number] Date of Birth/Sex: Treating RN: [name] [name] (67 y.o. [name] [name] Primary Care Provider: Cranford Mon Other Clinician: Referring Provider: Treating Provider/Extender: Shaune Pollack in Treatment: 13 Diagnosis Coding ICD-10 Codes Code Description [phone] Chronic venous hypertension (idiopathic) with ulcer of bilateral lower extremity L97.818 Non-pressure chronic ulcer of other part of right lower leg with other specified severity L97.828 Non-pressure chronic ulcer of other part of left lower leg with other specified severity I89.0 Lymphedema, not elsewhere classified E11.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 other skin ulcer K74.69 Other cirrhosis of liver Facility Procedures : CPT4 Code: [id number] Description: 7597 - DEBRIDE WOUND 1ST 20 SQ CM OR < ICD-10 Diagnosis Description I87.313 Chronic venous hypertension (idiopathic) with ulcer of bilateral lower extremity L97.818 Non-pressure chronic ulcer of other part of right lower leg  with other  specified s L97.828 Non-pressure chronic ulcer of other part of left lower leg with other specified se E11.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 other skin ulcer Modifier: everity verity Quantity: 1 Physician Procedures : CPT4 Code Description Modifier [id number] - WC PHYS DEBR WO ANESTH 20 SQ CM ICD-10 Diagnosis Description I87.313 Chronic venous hypertension (idiopathic) with ulcer of bilateral lower extremity L97.818 Non-pressure chronic ulcer of other part of  right lower leg with other specified severity L97.828 Non-pressure chronic ulcer of other part of left lower leg with other specified severity E11.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 other skin ulcer Quantity: 1 Electronic Signature(s) Signed: 03/25/2023 1:34:39 PM By: Geralyn Corwin DO Entered By: Geralyn Corwin on 03/25/2023 10:54:41

## 2023-04-02 NOTE — Progress Notes (Signed)
[name] [name], [name] [name] ([id number]) [id number] Page 1 of 14 Visit Report for 03/25/2023 Arrival Information Details Patient Name: Date of Service: [name] [name]. 03/25/2023 9:30 A [name] Medical Record Number: [id number] Patient Account Number: [id number] Date of Birth/Sex: Treating RN: May 02, 1956 (67 y.o. [name]) Primary Care Makarios Madlock: [place] Other Clinician: Referring Donia Yokum: Treating Makhi Muzquiz/Extender: Shaune Pollack in Treatment: 13 Visit Information History Since Last Visit Added or deleted any medications: No Patient Arrived: Ambulatory Any new allergies or adverse reactions: No Arrival Time: 09:26 Had a fall or experienced change in No Accompanied By: sister activities of daily living that may affect Transfer Assistance: None risk of falls: Patient Identification Verified: Yes Signs or symptoms of abuse/neglect since last visito No Secondary Verification Process Completed: Yes Hospitalized since last visit: No Patient Requires Transmission-Based No Implantable device outside of the clinic excluding No Precautions: cellular tissue based products placed in the center Patient Has Alerts: Yes since last visit: Patient Alerts: 10/23 ABI L0.94 R1 VVS Has Dressing in Place as Prescribed: Yes 10/23 TBI L1.2 R0.88 VVS Pain Present Now: No Electronic Signature(s) Signed: 03/28/2023 5:02:38 PM By: Thayer Dallas Entered By: Thayer Dallas on 03/25/2023 09:37:48 -------------------------------------------------------------------------------- Encounter Discharge Information Details Patient Name: Date of Service: [name] [name], [name] [name]. 03/25/2023 9:30 A [name] Medical Record Number: [id number] Patient Account Number: [id number] Date of Birth/Sex: Treating RN: 1956/06/08 (67 y.o. [name] [name] Primary Care Navayah Sok: [place] Other Clinician: Referring Alexsandro Salek: Treating [name]: Shaune Pollack in Treatment:  13 Encounter Discharge Information Items Post Procedure Vitals Discharge Condition: Stable Temperature (F): 98.2 Ambulatory Status: Ambulatory Pulse (bpm): 84 Discharge Destination: Home Respiratory Rate (breaths/min): 18 Transportation: Private Auto Blood Pressure (mmHg): 129/64 Accompanied By: spouse Schedule Follow-up Appointment: Yes Clinical Summary of Care: Patient Declined Electronic Signature(s) Signed: 04/02/2023 2:03:55 PM By: Brenton Grills Entered By: Brenton Grills on 03/25/2023 10:45:07 [name] [name] ([id number]) [id number] Page 2 of 14 -------------------------------------------------------------------------------- Lower Extremity Assessment Details Patient Name: Date of Service: [name] [name]. 03/25/2023 9:30 A [name] Medical Record Number: [id number] Patient Account Number: [id number] Date of Birth/Sex: Treating RN: [birth year] (67 y.o. [name]) Primary Care Antonio Creswell: [place] Other Clinician: Referring Kenroy Timberman: Treating Marvelyn Bouchillon/Extender: Shaune Pollack in Treatment: 13 Edema Assessment Assessed: [name]: No] [name]: No] Edema: [Left: No] [Right: No] Calf Left: Right: Point of Measurement: 37 cm From Medial Instep 33.5 cm 34.2 cm Ankle Left: Right: Point of Measurement: 13 cm From Medial Instep 22.3 cm 22 cm Vascular Assessment Extremity colors, hair growth, and conditions: Extremity Color: [Left:Hyperpigmented] [Right:Hyperpigmented] Hair Growth on Extremity: [Left:No] [Right:No] Temperature of Extremity: [Left:Warm] [Right:Warm] Capillary Refill: [Left:< 3 seconds] [Right:< 3 seconds] Dependent Rubor: [Left:No No] [Right:No Yes] Toe Nail Assessment Left: Right: Thick: No Yes Discolored: Yes Yes Deformed: Yes Yes Improper Length and Hygiene: Yes Yes Electronic Signature(s) Signed: 03/28/2023 5:02:38 PM By: Thayer Dallas Entered By: Thayer Dallas on 03/25/2023  09:39:18 -------------------------------------------------------------------------------- Multi Wound Chart Details Patient Name: Date of Service: [name] [name], [name] [name]. 03/25/2023 9:30 A [name] Medical Record Number: [id number] Patient Account Number: [id number] Date of Birth/Sex: Treating RN: Jul 02, 1956 (67 y.o. [name]) Primary Care Sosha Shepherd: [place] Other Clinician: Referring [name]: Treating Jiaire Rosebrook/Extender: Shaune Pollack in Treatment: 13 Vital Signs Height(in): 72 Pulse(bpm): 47 Weight(lbs): 245 Blood Pressure(mmHg): 121/70 Body Mass Index(BMI): 33.2 Temperature(F): 97.7 Respiratory Rate(breaths/min): [address] ([id number]) [id number] Page 3 of 14 [3:Photos:] Right, Proximal,  Lateral Lower Leg Left, Distal, Medial Lower Leg Right, Distal, Lateral Lower Leg Wound Location: Gradually Appeared Gradually Appeared Blister Wounding Event: Diabetic Wound/Ulcer of the Lower Diabetic Wound/Ulcer of the Lower Diabetic Wound/Ulcer of the Lower Primary Etiology: Extremity Extremity Extremity N/A Venous Leg Ulcer N/A Secondary Etiology: Anemia, Lymphedema, Hypertension, Anemia, Lymphedema, Hypertension, Anemia, Lymphedema, Hypertension, Comorbid History: Cirrhosis , Type II Diabetes Cirrhosis , Type II Diabetes Cirrhosis , Type II Diabetes 01/25/2023 02/11/2023 02/19/2023 Date Acquired: 7 6 4  Weeks of Treatment: Open Open Open Wound Status: No No No Wound Recurrence: No No Yes Clustered Wound: 3 N/A 1 Clustered Quantity: 0.6x0.5x0.1 0.1x0.1x0.1 0.5x0.6x0.1 Measurements L x W x D (cm) 0.236 0.008 0.236 A (cm) : rea 0.024 0.001 0.024 Volume (cm) : -232.40% 97.90% 62.90% % Reduction in A rea: -71.40% 97.40% 62.50% % Reduction in Volume: Grade 1 Grade 1 Grade 1 Classification: Medium Medium Medium Exudate A mount: Serosanguineous Serosanguineous Serosanguineous Exudate Type: red, brown red, brown red, brown Exudate  Color: Distinct, outline attached Distinct, outline attached Distinct, outline attached Wound Margin: None Present (0%) None Present (0%) None Present (0%) Granulation A mount: N/A N/A N/A Granulation Quality: Large (67-100%) None Present (0%) Large (67-100%) Necrotic A mount: Fat Layer (Subcutaneous Tissue): Yes Fascia: No Fat Layer (Subcutaneous Tissue): Yes Exposed Structures: Fascia: No Fat Layer (Subcutaneous Tissue): No Fascia: No Tendon: No Tendon: No Tendon: No Muscle: No Muscle: No Muscle: No Joint: No Joint: No Joint: No Bone: No Bone: No Bone: No Small (1-33%) Large (67-100%) Small (1-33%) Epithelialization: Debridement - Selective/Open Wound Debridement - Selective/Open Wound Debridement - Selective/Open Wound Debridement: Pre-procedure Verification/Time Out 10:20 10:20 10:20 Taken: Lidocaine 4% Topical Solution Lidocaine 4% Topical Solution Lidocaine 4% Topical Solution Pain Control: [employer] Tissue Debrided: Non-Viable Tissue Non-Viable Tissue Non-Viable Tissue Level: 0.24 0.01 0.24 Debridement A (sq cm): rea Curette Curette Curette Instrument: Minimum Minimum Minimum Bleeding: Pressure Pressure Pressure Hemostasis A chieved: 0 0 0 Procedural Pain: 0 0 0 Post Procedural Pain: Procedure was tolerated well Procedure was tolerated well Procedure was tolerated well Debridement Treatment Response: 0.6x0.5x0.1 0.1x0.1x0.1 0.5x0.6x0.1 Post Debridement Measurements L x W x D (cm) 0.024 0.001 0.024 Post Debridement Volume: (cm) Excoriation: No Excoriation: No Excoriation: No Periwound Skin Texture: Induration: No Induration: No Induration: No Callus: No Callus: No Callus: No Crepitus: No Crepitus: No Crepitus: No Rash: No Rash: No Rash: No Scarring: No Scarring: No Scarring: No Maceration: No Maceration: No Maceration: No Periwound Skin Moisture: Dry/Scaly: No Dry/Scaly: No Dry/Scaly: No Atrophie Blanche:  No Atrophie Blanche: No Atrophie Blanche: No Periwound Skin Color: Cyanosis: No Cyanosis: No Cyanosis: No Ecchymosis: No Ecchymosis: No Ecchymosis: No Erythema: No Erythema: No Erythema: No Hemosiderin Staining: No Hemosiderin Staining: No Hemosiderin Staining: No Mottled: No Mottled: No Mottled: No Pallor: No Pallor: No Pallor: No Rubor: No Rubor: No Rubor: No No Abnormality No Abnormality No Abnormality Temperature: N/A N/A N/A Assessment Notes: Debridement Debridement Debridement Procedures Performed: Wound Number: 6 7 N/A Photos: No Photos N/A GAHEL, SIDMAN ([id number]) [id number] Page 4 of 14 Left, Proximal, Posterior Lower Leg Left, Distal, Posterior Lower Leg N/A Wound Location: Blister Blister N/A Wounding Event: Diabetic Wound/Ulcer of the Lower Diabetic Wound/Ulcer of the Lower N/A Primary Etiology: Extremity Extremity N/A N/A N/A Secondary Etiology: Anemia, Lymphedema, Hypertension, Anemia, Lymphedema, Hypertension, N/A Comorbid History: Cirrhosis , Type II Diabetes Cirrhosis , Type II Diabetes 02/19/2023 02/19/2023 N/A Date Acquired: 4 4 N/A Weeks of Treatment: Open Healed - Epithelialized N/A Wound Status: No No  N/A Wound Recurrence: No No N/A Clustered Wound: N/A N/A N/A Clustered Quantity: 1x1.4x0.1 0x0x0 N/A Measurements L x W x D (cm) 1.1 0 N/A A (cm) : rea 0.11 0 N/A Volume (cm) : 33.30% 100.00% N/A % Reduction in A rea: 33.30% 100.00% N/A % Reduction in Volume: Grade 1 Grade 1 N/A Classification: Medium Medium N/A Exudate A mount: Serosanguineous Serosanguineous N/A Exudate Type: red, brown red, brown N/A Exudate Color: Distinct, outline attached Distinct, outline attached N/A Wound Margin: Small (1-33%) None Present (0%) N/A Granulation A mount: Pink N/A N/A Granulation Quality: Large (67-100%) Large (67-100%) N/A Necrotic A mount: Fat Layer (Subcutaneous Tissue): Yes Fat Layer  (Subcutaneous Tissue): Yes N/A Exposed Structures: Fascia: No Fascia: No Tendon: No Tendon: No Muscle: No Muscle: No Joint: No Joint: No Bone: No Bone: No Small (1-33%) Small (1-33%) N/A Epithelialization: Debridement - Selective/Open Wound N/A N/A Debridement: Pre-procedure Verification/Time Out 10:20 N/A N/A Taken: Lidocaine 4% Topical Solution N/A N/A Pain Control: Slough N/A N/A Tissue Debrided: Non-Viable Tissue N/A N/A Level: 1.1 N/A N/A Debridement A (sq cm): rea Curette N/A N/A Instrument: Minimum N/A N/A Bleeding: Pressure N/A N/A Hemostasis A chieved: 0 N/A N/A Procedural Pain: 0 N/A N/A Post Procedural Pain: Procedure was tolerated well N/A N/A Debridement Treatment Response: 1x1.4x0.1 N/A N/A Post Debridement Measurements L x W x D (cm) 0.11 N/A N/A Post Debridement Volume: (cm) Excoriation: No Excoriation: No N/A Periwound Skin Texture: Induration: No Induration: No Callus: No Callus: No Crepitus: No Crepitus: No Rash: No Rash: No Scarring: No Scarring: No Maceration: No Maceration: No N/A Periwound Skin Moisture: Dry/Scaly: No Dry/Scaly: No Atrophie Blanche: No Atrophie Blanche: No N/A Periwound Skin Color: Cyanosis: No Cyanosis: No Ecchymosis: No Ecchymosis: No Erythema: No Erythema: No Hemosiderin Staining: No Hemosiderin Staining: No Mottled: No Mottled: No Pallor: No Pallor: No Rubor: No Rubor: No No Abnormality No Abnormality N/A Temperature: N/A Has stitches from dermatology N/A Assessment Notes: Debridement N/A N/A Procedures Performed: Treatment Notes Electronic Signature(s) Signed: 03/25/2023 1:34:39 PM By: Geralyn Corwin DO Entered By: Geralyn Corwin on 03/25/2023 10:43:01 [name] [name] ([id number]) [id number] Page 5 of 14 -------------------------------------------------------------------------------- Multi-Disciplinary Care Plan Details Patient Name: Date of  Service: [name] [name]. 03/25/2023 9:30 A [name] Medical Record Number: [id number] Patient Account Number: [id number] Date of Birth/Sex: Treating RN: 08-05-1956 (67 y.o. [name] [name] Primary Care Nilton Lave: [place] Other Clinician: Referring Calix Heinbaugh: Treating Eagle Pitta/Extender: Shaune Pollack in Treatment: 13 Active Inactive Pain, Acute or Chronic Nursing Diagnoses: Pain, acute or chronic: actual or potential Potential alteration in comfort, pain Goals: Patient will verbalize adequate pain control and receive pain control interventions during procedures as needed Date Initiated: 12/24/2022 Target Resolution Date: 05/08/2023 Goal Status: Active Patient/caregiver will verbalize comfort level met Date Initiated: 12/24/2022 Date Inactivated: 01/31/2023 Target Resolution Date: 02/14/2023 Goal Status: Met Interventions: Encourage patient to take pain medications as prescribed Provide education on pain management Treatment Activities: Administer pain control measures as ordered : 12/24/2022 Notes: Electronic Signature(s) Signed: 04/02/2023 2:03:55 PM By: Brenton Grills Entered By: Brenton Grills on 03/25/2023 09:55:46 -------------------------------------------------------------------------------- Pain Assessment Details Patient Name: Date of Service: [name] [name] RRIS [name]. 03/25/2023 9:30 A [name] Medical Record Number: [id number] Patient Account Number: [id number] Date of Birth/Sex: Treating RN: 1955/09/20 (67 y.o. [name]) Primary Care Kera Deacon: [place] Other Clinician: Referring Riker Collier: Treating Caylan Schifano/Extender: Shaune Pollack in Treatment: 13 Active Problems Location of Pain Severity and Description of Pain Patient Has  [facility] [address] [place], [place] ([id number]) [id number] Page 6 of 14 Pain Management and Medication Current Pain Management: Electronic Signature(s) Signed: 03/28/2023 5:02:38 PM By:  Thayer Dallas Entered By: Thayer Dallas on 03/25/2023 09:38:23 -------------------------------------------------------------------------------- Patient/Caregiver Education Details Patient Name: Date of Service: [name] [name] 8/19/2024andnbsp9:30 A [name] Medical Record Number: [id number] Patient Account Number: [id number] Date of Birth/Gender: Treating RN: 1955/08/19 (68 y.o. [name] [name] Primary Care Physician: [place] Other Clinician: Referring Physician: Treating Physician/Extender: Shaune Pollack in Treatment: 13 Education Assessment Education Provided To: Patient and Caregiver Education Topics Provided Wound/Skin Impairment: Methods: Explain/Verbal Responses: State content correctly [name]) Signed: 04/02/2023 2:03:55 PM By: Brenton Grills Entered By: Brenton Grills on 03/25/2023 10:02:02 -------------------------------------------------------------------------------- Wound Assessment Details Patient Name: Date of Service: [name] [name] RRIS [name]. 03/25/2023 9:30 A [name] Medical Record Number: [id number] Patient Account Number: [id number] Date of Birth/Sex: Treating RN: 10/14/1955 (67 y.o. [name]) Primary Care Jachob Mcclean: [place] Other Clinician: Referring Arcadio Cope: Treating Jentry Mcqueary/Extender: Miro, Marsman, [place] ([id number]) [id number] Page 7 of 14 Weeks in Treatment: 13 Wound Status Wound Number: 3 Primary Diabetic Wound/Ulcer of the Lower Extremity Etiology: Wound Location: Right, Proximal, Lateral Lower Leg Wound Status: Open Wounding Event: Gradually Appeared Comorbid Anemia, Lymphedema, Hypertension, Cirrhosis , Type II Date Acquired: 01/25/2023 History: Diabetes Weeks Of Treatment: 7 Clustered Wound: No Photos Wound Measurements Length: (cm) Width: (cm) Depth: (cm) Clustered Quantity: Area: (cm) Volume: (cm) 0.6 % Reduction in Area: -232.4% 0.5 % Reduction in  Volume: -71.4% 0.1 Epithelialization: Small (1-33%) 3 Tunneling: No 0.236 Undermining: No 0.024 Wound Description Classification: Grade 1 Wound Margin: Distinct, outline attached Exudate Amount: Medium Exudate Type: Serosanguineous Exudate Color: red, brown Foul Odor After Cleansing: No Slough/Fibrino Yes Wound Bed Granulation Amount: None Present (0%) Exposed Structure Necrotic Amount: Large (67-100%) Fascia Exposed: No Necrotic Quality: Adherent Slough Fat Layer (Subcutaneous Tissue) Exposed: Yes Tendon Exposed: No Muscle Exposed: No Joint Exposed: No Bone Exposed: No Periwound Skin Texture Texture Color No Abnormalities Noted: No No Abnormalities Noted: No Callus: No Atrophie Blanche: No Crepitus: No Cyanosis: No Excoriation: No Ecchymosis: No Induration: No Erythema: No Rash: No Hemosiderin Staining: No Scarring: No Mottled: No Pallor: No Moisture Rubor: No No Abnormalities Noted: No Dry / Scaly: No Temperature / Pain Maceration: No Temperature: No Abnormality Treatment Notes Wound #3 (Lower Leg) Wound Laterality: Right, Lateral, Proximal Cleanser Soap and Water Discharge Instruction: May shower and wash wound with dial antibacterial soap and water prior to dressing change. Peri-Wound Care Triamcinolone 15 (g) Discharge Instruction: Use triamcinolone apply to entire both legs. around the wounds. ANTONNIO, MCDAVID ([id number]) [id number] Page 8 of 14 Topical Primary Dressing PolyMem Silver Non-Adhesive Dressing, 4.25x4.25 in Discharge Instruction: Apply to wound bed as instructed Santyl Ointment Discharge Instruction: Apply nickel thick amount to wound bed as instructed Secondary Dressing Woven Gauze Sponge, Non-Sterile 4x4 in Discharge Instruction: Apply over primary dressing as directed. Secured With [employer], 4.5x3.1 (in/yd) Discharge Instruction: Secure with Kerlix as directed. 62M Medipore H Soft Cloth  Surgical T ape, 4 x 10 (in/yd) Discharge Instruction: Secure with tape as directed. Compression Wrap Compression Stockings Add-Ons Electronic Signature(s) Signed: 03/28/2023 5:02:38 PM By: Thayer Dallas Entered By: Thayer Dallas on 03/25/2023 09:46:11 -------------------------------------------------------------------------------- Wound Assessment Details Patient Name: Date of Service: [name] [name] RRIS [name]. 03/25/2023 9:30 A [name] Medical Record Number: [id number] Patient Account Number: [id number] Date of Birth/Sex: Treating RN: July 10, 1956 (67 y.o. [name]) Primary  Care Demichael Traum: [place] Other Clinician: Referring Kevion Fatheree: Treating Santoria Chason/Extender: Shaune Pollack in Treatment: 13 Wound Status Wound Number: 4 Primary Etiology: Diabetic Wound/Ulcer of the Lower Extremity Wound Location: Left, Distal, Medial Lower Leg Secondary Venous Leg Ulcer Etiology: Wounding Event: Gradually Appeared Wound Status: Open Date Acquired: 02/11/2023 Comorbid Anemia, Lymphedema, Hypertension, Cirrhosis , Type II Weeks Of Treatment: 6 History: Diabetes Clustered Wound: No Photos Wound Measurements Length: (cm) 0.1 Width: (cm) 0.1 Depth: (cm) 0.1 Area: (cm) 0.008 Volume: (cm) 0.001 DOLORES, [name] [name] ([id number]) % Reduction in Area: 97.9% % Reduction in Volume: 97.4% Epithelialization: Large (67-100%) Tunneling: No Undermining: No [id number] Page 9 of 14 Wound Description Classification: Grade 1 Wound Margin: Distinct, outline attached Exudate Amount: Medium Exudate Type: Serosanguineous Exudate Color: red, brown Foul Odor After Cleansing: No Slough/Fibrino Yes Wound Bed Granulation Amount: None Present (0%) Exposed Structure Necrotic Amount: None Present (0%) Fascia Exposed: No Fat Layer (Subcutaneous Tissue) Exposed: No Tendon Exposed: No Muscle Exposed: No Joint Exposed: No Bone Exposed: No Periwound Skin Texture Texture Color No  Abnormalities Noted: No No Abnormalities Noted: No Callus: No Atrophie Blanche: No Crepitus: No Cyanosis: No Excoriation: No Ecchymosis: No Induration: No Erythema: No Rash: No Hemosiderin Staining: No Scarring: No Mottled: No Pallor: No Moisture Rubor: No No Abnormalities Noted: No Dry / Scaly: No Temperature / Pain Maceration: No Temperature: No Abnormality Treatment Notes Wound #4 (Lower Leg) Wound Laterality: Left, Medial, Distal Cleanser Soap and Water Discharge Instruction: May shower and wash wound with dial antibacterial soap and water prior to dressing change. Peri-Wound Care Triamcinolone 15 (g) Discharge Instruction: Use triamcinolone apply to entire both legs. around the wounds. Topical Primary Dressing PolyMem Silver Non-Adhesive Dressing, 4.25x4.25 in Discharge Instruction: Apply to wound bed as instructed Santyl Ointment Discharge Instruction: Apply nickel thick amount to wound bed as instructed Secondary Dressing Woven Gauze Sponge, Non-Sterile 4x4 in Discharge Instruction: Apply over primary dressing as directed. Secured With [employer], 4.5x3.1 (in/yd) Discharge Instruction: Secure with Kerlix as directed. 39M Medipore H Soft Cloth Surgical T ape, 4 x 10 (in/yd) Discharge Instruction: Secure with tape as directed. Compression Wrap Compression Stockings Add-Ons Electronic Signature(s) Signed: 03/28/2023 5:02:38 PM By: Thayer Dallas Entered By: Thayer Dallas on 03/25/2023 09:45:01 [name] [name] ([id number]) [id number] Page 10 of 14 -------------------------------------------------------------------------------- Wound Assessment Details Patient Name: Date of Service: [name] [name]. 03/25/2023 9:30 A [name] Medical Record Number: [id number] Patient Account Number: [id number] Date of Birth/Sex: Treating RN: 1955-11-28 (67 y.o. [name]) Primary Care Jarquez Mestre: [place] Other Clinician: Referring Slayton Lubitz: Treating  Abrielle Finck/Extender: Shaune Pollack in Treatment: 13 Wound Status Wound Number: 5 Primary Diabetic Wound/Ulcer of the Lower Extremity Etiology: Wound Location: Right, Distal, Lateral Lower Leg Wound Status: Open Wounding Event: Blister Comorbid Anemia, Lymphedema, Hypertension, Cirrhosis , Type II Date Acquired: 02/19/2023 History: Diabetes Weeks Of Treatment: 4 Clustered Wound: Yes Photos Wound Measurements Length: (cm) Width: (cm) Depth: (cm) Clustered Quantity: Area: (cm) Volume: (cm) 0.5 % Reduction in Area: 62.9% 0.6 % Reduction in Volume: 62.5% 0.1 Epithelialization: Small (1-33%) 1 Tunneling: No 0.236 Undermining: No 0.024 Wound Description Classification: Grade 1 Wound Margin: Distinct, outline attached Exudate Amount: Medium Exudate Type: Serosanguineous Exudate Color: red, brown Foul Odor After Cleansing: No Slough/Fibrino Yes Wound Bed Granulation Amount: None Present (0%) Exposed Structure Necrotic Amount: Large (67-100%) Fascia Exposed: No Necrotic Quality: Adherent Slough Fat Layer (Subcutaneous Tissue) Exposed: Yes Tendon Exposed: No Muscle Exposed: No Joint Exposed: No  Bone Exposed: No Periwound Skin Texture Texture Color No Abnormalities Noted: No No Abnormalities Noted: No Callus: No Atrophie Blanche: No Crepitus: No Cyanosis: No Excoriation: No Ecchymosis: No Induration: No Erythema: No Rash: No Hemosiderin Staining: No Scarring: No Mottled: No Pallor: No Moisture Rubor: No No Abnormalities Noted: No Dry / Scaly: No Temperature / Pain Maceration: No Temperature: No Abnormality [name] [name], [name] [name] ([id number]) [id number] Page 11 of 14 Treatment Notes Wound #5 (Lower Leg) Wound Laterality: Right, Lateral, Distal Cleanser Soap and Water Discharge Instruction: May shower and wash wound with dial antibacterial soap and water prior to dressing change. Peri-Wound Care Triamcinolone 15  (g) Discharge Instruction: Use triamcinolone apply to entire both legs. around the wounds. Topical Primary Dressing PolyMem Silver Non-Adhesive Dressing, 4.25x4.25 in Discharge Instruction: Apply to wound bed as instructed Santyl Ointment Discharge Instruction: Apply nickel thick amount to wound bed as instructed Secondary Dressing Woven Gauze Sponge, Non-Sterile 4x4 in Discharge Instruction: Apply over primary dressing as directed. Secured With [employer], 4.5x3.1 (in/yd) Discharge Instruction: Secure with Kerlix as directed. 27M Medipore H Soft Cloth Surgical T ape, 4 x 10 (in/yd) Discharge Instruction: Secure with tape as directed. Compression Wrap Compression Stockings Add-Ons Electronic Signature(s) Signed: 03/28/2023 5:02:38 PM By: Thayer Dallas Entered By: Thayer Dallas on 03/25/2023 09:46:34 -------------------------------------------------------------------------------- Wound Assessment Details Patient Name: Date of Service: [name] [name] RRIS [name]. 03/25/2023 9:30 A [name] Medical Record Number: [id number] Patient Account Number: [id number] Date of Birth/Sex: Treating RN: Jan 05, 1956 (67 y.o. [name]) Primary Care Medard Decuir: [place] Other Clinician: Referring Bracha Frankowski: Treating Shatana Saxton/Extender: Shaune Pollack in Treatment: 13 Wound Status Wound Number: 6 Primary Diabetic Wound/Ulcer of the Lower Extremity Etiology: Wound Location: Left, Proximal, Posterior Lower Leg Wound Status: Open Wounding Event: Blister Comorbid Anemia, Lymphedema, Hypertension, Cirrhosis , Type II Date Acquired: 02/19/2023 History: Diabetes Weeks Of Treatment: 4 Clustered Wound: No Photos [name] [name], [name] [name] ([id number]) [id number] Page 12 of 14 Wound Measurements Length: (cm) Width: (cm) Depth: (cm) Area: (cm) Volume: (cm) 1 % Reduction in Area: 33.3% 1.4 % Reduction in Volume: 33.3% 0.1 Epithelialization: Small (1-33%) 1.1 Tunneling:  No 0.11 Undermining: No Wound Description Classification: Grade 1 Wound Margin: Distinct, outline attached Exudate Amount: Medium Exudate Type: Serosanguineous Exudate Color: red, brown Foul Odor After Cleansing: No Slough/Fibrino Yes Wound Bed Granulation Amount: Small (1-33%) Exposed Structure Granulation Quality: Pink Fascia Exposed: No Necrotic Amount: Large (67-100%) Fat Layer (Subcutaneous Tissue) Exposed: Yes Necrotic Quality: Adherent Slough Tendon Exposed: No Muscle Exposed: No Joint Exposed: No Bone Exposed: No Periwound Skin Texture Texture Color No Abnormalities Noted: No No Abnormalities Noted: No Callus: No Atrophie Blanche: No Crepitus: No Cyanosis: No Excoriation: No Ecchymosis: No Induration: No Erythema: No Rash: No Hemosiderin Staining: No Scarring: No Mottled: No Pallor: No Moisture Rubor: No No Abnormalities Noted: No Dry / Scaly: No Temperature / Pain Maceration: No Temperature: No Abnormality Treatment Notes Wound #6 (Lower Leg) Wound Laterality: Left, Posterior, Proximal Cleanser Soap and Water Discharge Instruction: May shower and wash wound with dial antibacterial soap and water prior to dressing change. Peri-Wound Care Triamcinolone 15 (g) Discharge Instruction: Use triamcinolone apply to entire both legs. around the wounds. Topical Primary Dressing PolyMem Silver Non-Adhesive Dressing, 4.25x4.25 in Discharge Instruction: Apply to wound bed as instructed Santyl Ointment Discharge Instruction: Apply nickel thick amount to wound bed as instructed Secondary Dressing Woven Gauze Sponge, Non-Sterile 4x4 in Discharge Instruction: Apply over primary dressing as directed. [name] [name], [name] [name] ([id number]) [id number] Page 13 of 14  Secured With [employer], 4.5x3.1 (in/yd) Discharge Instruction: Secure with Kerlix as directed. 41M Medipore H Soft Cloth Surgical T ape, 4 x 10 (in/yd) Discharge Instruction:  Secure with tape as directed. Compression Wrap Compression Stockings Add-Ons Electronic Signature(s) Signed: 03/28/2023 5:02:38 PM By: Thayer Dallas Entered By: Thayer Dallas on 03/25/2023 09:44:27 -------------------------------------------------------------------------------- Wound Assessment Details Patient Name: Date of Service: [name] [name] RRIS [name]. 03/25/2023 9:30 A [name] Medical Record Number: [id number] Patient Account Number: [id number] Date of Birth/Sex: Treating RN: [birth year] (67 y.o. [name] [name] Primary Care Cassia Fein: [place] [facility] Other Clinician: Referring Izaah Westman: Treating Damya Comley/Extender: Shaune Pollack in Treatment: 13 Wound Status Wound Number: 7 Primary Diabetic Wound/Ulcer of the Lower Extremity Etiology: Wound Location: Left, Distal, Posterior Lower Leg Wound Status: Healed - Epithelialized Wounding Event: Blister Comorbid Anemia, Lymphedema, Hypertension, Cirrhosis , Type II Date Acquired: 02/19/2023 History: Diabetes Weeks Of Treatment: 4 Clustered Wound: No Wound Measurements Length: (cm) Width: (cm) Depth: (cm) Area: (cm) Volume: (cm) 0 % Reduction in Area: 100% 0 % Reduction in Volume: 100% 0 Epithelialization: Small (1-33%) 0 Tunneling: No 0 Undermining: No Wound Description Classification: Grade 1 Wound Margin: Distinct, outline attached Exudate Amount: Medium Exudate Type: Serosanguineous Exudate Color: red, brown Foul Odor After Cleansing: No Slough/Fibrino Yes Wound Bed Granulation Amount: None Present (0%) Exposed Structure Necrotic Amount: Large (67-100%) Fascia Exposed: No Fat Layer (Subcutaneous Tissue) Exposed: Yes Tendon Exposed: No Muscle Exposed: No Joint Exposed: No Bone Exposed: No Periwound Skin Texture Texture Color No Abnormalities Noted: No No Abnormalities Noted: No Callus: No Atrophie Blanche: No Crepitus: No Cyanosis: No Excoriation: No Ecchymosis: No Induration: No Erythema:  No Rash: No Hemosiderin Staining: No Scarring: No Mottled: No Pallor: No [address] [name] [name], [name] [name] ([id number]) [id number] Page 14 of 14 Rubor: No No Abnormalities Noted: No Dry / Scaly: No Temperature / Pain Maceration: No Temperature: No Abnormality Assessment Notes Has stitches from dermatology Treatment Notes Wound #7 (Lower Leg) Wound Laterality: Left, Posterior, Distal Cleanser Peri-Wound Care Topical Primary Dressing Secondary Dressing Secured With Compression Wrap Compression Stockings Add-Ons Electronic Signature(s) Signed: 04/02/2023 2:03:55 PM By: Brenton Grills Entered By: Brenton Grills on 03/25/2023 10:26:03 -------------------------------------------------------------------------------- Vitals Details Patient Name: Date of Service: [name] [name], NO RRIS [name]. 03/25/2023 9:30 A [name] Medical Record Number: [id number] Patient Account Number: [id number] Date of Birth/Sex: Treating RN: 1955-09-24 (67 y.o. [name]) Primary Care Zyhir Cappella: [place] [facility] Other Clinician: Referring Stepheny Canal: Treating Genesis Paget/Extender: Shaune Pollack in Treatment: 13 Vital Signs Time Taken: 09:37 Temperature (F): 97.7 Height (in): 72 Pulse (bpm): 47 Weight (lbs): 245 Respiratory Rate (breaths/min): 16 Body Mass Index (BMI): 33.2 Blood Pressure (mmHg): 121/70 Reference Range: 80 - 120 mg / dl Electronic Signature(s) Signed: 03/28/2023 5:02:38 PM By: Thayer Dallas Entered By: Thayer Dallas on 03/25/2023 09:38:16

## 2023-04-09 ENCOUNTER — Encounter (HOSPITAL_BASED_OUTPATIENT_CLINIC_OR_DEPARTMENT_OTHER): Payer: Medicare Other | Attending: Internal Medicine | Admitting: Internal Medicine

## 2023-04-09 DIAGNOSIS — E11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 other skin ulcer: Secondary | ICD-10-CM | POA: Diagnosis not present

## 2023-04-09 DIAGNOSIS — F17200 Nicotine dependence, unspecified, uncomplicated: Secondary | ICD-10-CM | POA: Insufficient documentation

## 2023-04-09 DIAGNOSIS — K7469 Other cirrhosis of liver: Secondary | ICD-10-CM | POA: Diagnosis not present

## 2023-04-09 DIAGNOSIS — L97828 Non-pressure chronic ulcer of other part of left lower leg with other specified severity: Secondary | ICD-10-CM | POA: Diagnosis not present

## 2023-04-09 DIAGNOSIS — E1122 Type 2 diabetes mellitus with diabetic chronic kidney disease: Secondary | ICD-10-CM | POA: Insufficient documentation

## 2023-04-09 DIAGNOSIS — I872 Venous insufficiency (chronic) (peripheral): Secondary | ICD-10-CM | POA: Insufficient documentation

## 2023-04-09 DIAGNOSIS — Z833 Family history of diabetes mellitus: Secondary | ICD-10-CM | POA: Diagnosis not present

## 2023-04-09 DIAGNOSIS — L97818 Non-pressure chronic ulcer of other part of right lower leg with other specified severity: Secondary | ICD-10-CM | POA: Diagnosis not present

## 2023-04-09 DIAGNOSIS — Z8249 Family history of ischemic heart disease and other diseases of the circulatory system: Secondary | ICD-10-CM | POA: Diagnosis not present

## 2023-04-09 DIAGNOSIS — I890 Lymphedema, not elsewhere classified: Secondary | ICD-10-CM | POA: Insufficient documentation

## 2023-04-09 DIAGNOSIS — Z794 Long term (current) use of insulin: Secondary | ICD-10-CM | POA: Diagnosis not present

## 2023-04-09 DIAGNOSIS — I129 Hypertensive chronic kidney disease with stage 1 through stage 4 chronic kidney disease, or unspecified chronic kidney disease: Secondary | ICD-10-CM | POA: Insufficient documentation

## 2023-04-09 DIAGNOSIS — N183 Chronic kidney disease, stage 3 unspecified: Secondary | ICD-10-CM | POA: Insufficient documentation

## 2023-04-09 DIAGNOSIS — I87313 Chronic venous hypertension (idiopathic) with ulcer of bilateral lower extremity: Secondary | ICD-10-CM | POA: Diagnosis not present

## 2023-04-09 DIAGNOSIS — L950 Livedoid vasculitis: Secondary | ICD-10-CM | POA: Diagnosis not present

## 2023-04-15 ENCOUNTER — Encounter (HOSPITAL_BASED_OUTPATIENT_CLINIC_OR_DEPARTMENT_OTHER): Payer: Medicare Other | Admitting: Internal Medicine

## 2023-04-15 DIAGNOSIS — L97818 Non-pressure chronic ulcer of other part of right lower leg with other specified severity: Secondary | ICD-10-CM | POA: Diagnosis not present

## 2023-04-15 DIAGNOSIS — I87313 Chronic venous hypertension (idiopathic) with ulcer of bilateral lower extremity: Secondary | ICD-10-CM | POA: Diagnosis not present

## 2023-04-15 DIAGNOSIS — L97828 Non-pressure chronic ulcer of other part of left lower leg with other specified severity: Secondary | ICD-10-CM | POA: Diagnosis not present

## 2023-04-15 DIAGNOSIS — E11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 other skin ulcer: Secondary | ICD-10-CM | POA: Diagnosis not present

## 2023-04-16 NOTE — Progress Notes (Signed)
JSIAH, VOLKERS ([id number]) [id number] Page 1 of 10 Visit Report for 04/15/2023 Chief Complaint Document Details Patient Name: Date of Service: [name] [name]. 04/15/2023 8:15 A [name] Medical Record Number: [id number] Patient Account Number: [id number] Date of Birth/Sex: Treating RN: [birth year] (67 y.o. [name]) Primary Care Provider: Cranford Mon Other Clinician: Referring Provider: Treating Provider/Extender: Shaune Pollack in Treatment: 16 Information Obtained from: Patient Chief Complaint 12/24/2022; bilateral lower extremity wounds Electronic Signature(s) Signed: 04/15/2023 9:39:51 AM By: Geralyn Corwin DO Entered By: Geralyn Corwin on 04/15/2023 06:10:43 -------------------------------------------------------------------------------- HPI Details Patient Name: Date of Service: [name] [name], [name] [name]. 04/15/2023 8:15 A [name] Medical Record Number: [id number] Patient Account Number: [id number] Date of Birth/Sex: Treating RN: 07/25/1956 (67 y.o. [name]) Primary Care Provider: Cranford Mon Other Clinician: Referring Provider: Treating Provider/Extender: Shaune Pollack in Treatment: 16 History of Present Illness HPI Description: 12/24/2022 [name] [name] is a 67 year old male with a past medical history of controlled type 2 diabetes on insulin, lymphedema/chronic venous insufficiency and cirrhosis that presents to the clinic for a 1 year history of nonhealing wounds to his lower extremities bilaterally. He reports the starting spontaneously. He states that he has been following with [facility] and they have been using [facility] antibiotic spray to the legs. He is not wearing compression stockings or wraps. He is also tried Medihoney and collagen in the past with little benefit. He currently denies systemic signs of infection. 5/30; patient presents for follow-up. He has been using triamcinolone cream to the periwound and  Santyl to the wound beds. There has been improvement in wound healing. He denies signs of infection and has [name] issues or complaints today. 6/4; both legs look considerably better. He has a wound on the right lateral lower leg and the left medial lower leg. Significant hemosiderin in the right leg less so on the left. We have been using Hydrofera Blue under compression 6/13; patient presents for follow-up. We have been using antibiotic ointment with Hydrofera Blue under compression therapy. The wounds are smaller. He has [name] issues or complaints today. We discussed ordering juxta lite compression garment wraps T use once his wounds heal as he has hard time putting on o compression stockings. He was agreeable with this. 6/20; patient presents for follow-up. We have been using antibiotic ointment with Hydrofera Blue under compression therapy to the lower extremities bilaterally. Wounds are smaller. He has been approved for PuraPly and was agreeable with placement today. He denies signs of infection. 6/27; patient presents for follow-up. We have been using antibiotic ointment with Hydrofera Blue under compression therapy to the left lower extremity. This wound is healed. He has his juxta lite compression wraps with him today. T the right lateral leg we have been using PuraPly under compression and that wound o is smaller today. Unfortunately he has developed skin breakdown from the Steri-Strip that was used to hold the PuraPly in place and this has created a new wound on the right leg. 7/8; patient presents for follow-up. We have been using PuraPly to the right lateral leg wound Under compression wrap. Wound is slightly smaller however original wound used for PuraPly is healed. He unfortunately developed a new wound to the left lower extremity but it looks like this was from potential scratching. He has been using his juxta lite compression here. 716; patient presents for follow-up. We have been using  antibiotic ointment with Hydrofera Blue under 3 layer compression  to lower extremities bilaterally. Unfortunately he has continued to develop wounds to the left lower extremity. He currently denies signs of infection. BROX, CRIADO ([id number]) [id number] Page 2 of 10 7/22; patient presents for follow-up. He has been using Santyl and Hydrofera Blue with triamcinolone cream under her juxta lite compression wraps daily. [name] new wounds today. Current wounds are stable. Less irritation to the periwound. 7/29; patient presents for follow-up. He has been using Santyl and Hydrofera Blue with triamcinolone cream under the juxta lite compression wraps. He is having a hard time keeping the [facility] in place to the wound beds. He denies signs of infection. 8/5; patient presents for follow-up. Has been using Santyl to the wound beds and triamcinolone cream under the juxta lite compression. He has [name] issues or complaints today. Wounds are overall stable. 8/12; patient presents for follow-up. He has been using Santyl to the wound beds and triamcinolone cream to the periwound under JuxtaLite compression. He is scheduled to see dermatology this week. Wounds are overall stable. 8/19; patient presents for follow-up. He has been using Santyl and PolyMem to the wound beds along with triamcinolone cream to the periwound under his juxta light compression. He saw dermatology and they obtained a biopsy of the distal medial wound on the left leg. He also had venous reflux studies last week that showed [name] venous reflux on the left and only venous reflux to the CFV with [name] venous reflux throughout the rest of the venous system. 9/3;Patient presents for follow-up. Patient has been using PolyMem silver with Santyl. Patient was seen by dermatology and they did a punch biopsy. 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 Patient was prescribed silver Silvadene. He has not started this yet.  Overall wounds appear well-healing. 9/9; patient presents for follow-up. We have been using PolyMem silver with Santyl. Wounds are smaller. He has been using his compression Velcro wrap daily. He declines debridement today. Electronic Signature(s) Signed: 04/15/2023 9:39:51 AM By: Geralyn Corwin DO Entered By: Geralyn Corwin on 04/15/2023 06:11:09 -------------------------------------------------------------------------------- Physical Exam Details Patient Name: Date of Service: [name] 52, [name] [name]. 04/15/2023 8:15 A [name] Medical Record Number: [id number] Patient Account Number: [id number] Date of Birth/Sex: Treating RN: 1956-02-01 (67 y.o. [name]) Primary Care Provider: Cranford Mon Other Clinician: Referring Provider: Treating Provider/Extender: Shaune Pollack in Treatment: 16 Constitutional respirations regular, non-labored and within target range for patient.. Cardiovascular 2+ dorsalis pedis/posterior tibialis pulses. Psychiatric pleasant and cooperative. Notes T the lower extremities bilaterally there are 2 small open wounds On each side with granulation tissue and scant nonviable tissue. [name] signs of surrounding o infection. Decent edema control. Venous stasis dermatitis. Electronic Signature(s) Signed: 04/15/2023 9:39:51 AM By: Geralyn Corwin DO Entered By: Geralyn Corwin on 04/15/2023 06:12:47 -------------------------------------------------------------------------------- Physician Orders Details Patient Name: Date of Service: [name] [name], [name] [name]. 04/15/2023 8:15 A [name] Medical Record Number: [id number] Patient Account Number: [id number] Date of Birth/Sex: Treating RN: Dec 07, 1955 (67 y.o. [name] Primary Care Provider: Cranford Mon Other Clinician: Referring Provider: Treating Provider/Extender: Shaune Pollack in Treatment: [address] / Phone Orders: [name] CHRISTO, WITTS ([id number]) [id number] Page 3 of  10 Diagnosis Coding Follow-up Appointments ppointment in 2 weeks. - Dr Leanord Hawking - Room 9 Monday 04/29/23 @ 0900 Return A Other: - Upcoming Dermatology appointment next Wednesday Anesthetic (In clinic) Topical Lidocaine 4% applied to wound bed Bathing/ Shower/ Hygiene May shower with protection but do not get wound dressing(s) wet. Protect dressing(s)  with water repellant cover (for example, large plastic bag) or a cast cover and may then take shower. Edema Control - Lymphedema / SCD / Other Elevate legs to the level of the heart or above for 30 minutes daily and/or when sitting for 3-4 times a day throughout the day. Avoid standing for long periods of time. Patient to wear own compression stockings every day. Exercise regularly Compression stocking or Garment 30-40 mm/Hg pressure to: - wear juxatlite HDs both legs. Wound Treatment Wound #3 - Lower Leg Wound Laterality: Right, Anterior, Proximal Cleanser: Soap and Water 1 x Per Day/15 Days Discharge Instructions: May shower and wash wound with dial antibacterial soap and water prior to dressing change. Peri-Wound Care: Triamcinolone 15 (g) 1 x Per Day/15 Days Discharge Instructions: Use triamcinolone apply to entire both legs. around the wounds. Prim Dressing: PolyMem Silver Non-Adhesive Dressing, 4.25x4.25 in 1 x Per Day/15 Days ary Discharge Instructions: Apply to wound bed as instructed Prim Dressing: Santyl Ointment 1 x Per Day/15 Days ary Discharge Instructions: Apply nickel thick amount to wound bed as instructed Secondary Dressing: Woven Gauze Sponge, Non-Sterile 4x4 in (Generic) 1 x Per Day/15 Days Discharge Instructions: Apply over primary dressing as directed. Secured With: [employer], 4.5x3.1 (in/yd) (Generic) 1 x Per Day/15 Days Discharge Instructions: Secure with Kerlix as directed. Secured With: 7M Medipore H Soft Cloth Surgical T ape, 4 x 10 (in/yd) (Generic) 1 x Per Day/15 Days Discharge Instructions: Secure  with tape as directed. Wound #4 - Lower Leg Wound Laterality: Left, Medial, Distal Cleanser: Soap and Water 1 x Per Day/15 Days Discharge Instructions: May shower and wash wound with dial antibacterial soap and water prior to dressing change. Peri-Wound Care: Triamcinolone 15 (g) 1 x Per Day/15 Days Discharge Instructions: Use triamcinolone apply to entire both legs. around the wounds. Prim Dressing: PolyMem Silver Non-Adhesive Dressing, 4.25x4.25 in 1 x Per Day/15 Days ary Discharge Instructions: Apply to wound bed as instructed Prim Dressing: Santyl Ointment 1 x Per Day/15 Days ary Discharge Instructions: Apply nickel thick amount to wound bed as instructed Secondary Dressing: Woven Gauze Sponge, Non-Sterile 4x4 in (Generic) 1 x Per Day/15 Days Discharge Instructions: Apply over primary dressing as directed. Secured With: [employer], 4.5x3.1 (in/yd) (Generic) 1 x Per Day/15 Days Discharge Instructions: Secure with Kerlix as directed. Secured With: 7M Medipore H Soft Cloth Surgical T ape, 4 x 10 (in/yd) (Generic) 1 x Per Day/15 Days Discharge Instructions: Secure with tape as directed. Wound #5 - Lower Leg Wound Laterality: Right, Lateral, Distal Cleanser: Soap and Water 1 x Per Day/15 Days Discharge Instructions: May shower and wash wound with dial antibacterial soap and water prior to dressing change. Peri-Wound Care: Triamcinolone 15 (g) 1 x Per Day/15 Days Discharge Instructions: Use triamcinolone apply to entire both legs. around the wounds. Prim Dressing: PolyMem Silver Non-Adhesive Dressing, 4.25x4.25 in 1 x Per Day/15 Days ary Discharge Instructions: Apply to wound bed as instructed Prim Dressing: Santyl Ointment 1 x Per Day/15 Days ary Discharge Instructions: Apply nickel thick amount to wound bed as instructed [name], [name] ([id number]) [id number] Page 4 of 10 Secondary Dressing: Woven Gauze Sponge, Non-Sterile 4x4 in (Generic) 1 x Per  Day/15 Days Discharge Instructions: Apply over primary dressing as directed. Secured With: [employer], 4.5x3.1 (in/yd) (Generic) 1 x Per Day/15 Days Discharge Instructions: Secure with Kerlix as directed. Secured With: 7M Medipore H Soft Cloth Surgical T ape, 4 x 10 (in/yd) (Generic) 1 x Per Day/15 Days Discharge Instructions:  Secure with tape as directed. Wound #6 - Lower Leg Wound Laterality: Left, Posterior, Proximal Cleanser: Soap and Water 1 x Per Day/15 Days Discharge Instructions: May shower and wash wound with dial antibacterial soap and water prior to dressing change. Peri-Wound Care: Triamcinolone 15 (g) 1 x Per Day/15 Days Discharge Instructions: Use triamcinolone apply to entire both legs. around the wounds. Prim Dressing: PolyMem Silver Non-Adhesive Dressing, 4.25x4.25 in 1 x Per Day/15 Days ary Discharge Instructions: Apply to wound bed as instructed Prim Dressing: Santyl Ointment 1 x Per Day/15 Days ary Discharge Instructions: Apply nickel thick amount to wound bed as instructed Secondary Dressing: Woven Gauze Sponge, Non-Sterile 4x4 in (Generic) 1 x Per Day/15 Days Discharge Instructions: Apply over primary dressing as directed. Secured With: [employer], 4.5x3.1 (in/yd) (Generic) 1 x Per Day/15 Days Discharge Instructions: Secure with Kerlix as directed. Secured With: 71M Medipore H Soft Cloth Surgical T ape, 4 x 10 (in/yd) (Generic) 1 x Per Day/15 Days Discharge Instructions: Secure with tape as directed. Patient Medications llergies: [name] Known Drug Allergies A Notifications Medication Indication Start End 04/15/2023 lidocaine DOSE topical 5 % ointment - ointment topical once daily Electronic Signature(s) Signed: 04/15/2023 9:39:51 AM By: Geralyn Corwin DO Entered By: Geralyn Corwin on 04/15/2023 06:12:55 -------------------------------------------------------------------------------- Problem List Details Patient Name: Date of Service: [name] [name] [name]  RRIS [name]. 04/15/2023 8:15 A [name] Medical Record Number: [id number] Patient Account Number: [id number] Date of Birth/Sex: Treating RN: [birth year] (67 y.o. 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 12/24/2022 [name] Yes severity KACESON, BENALLY ([id number]) [id number] Page 5 of 10 [phone] Non-pressure chronic ulcer of other part of left lower leg with other specified 12/24/2022 [name] Yes severity 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 04/15/2023 8:15 A [name] Medical Record Number: [id number] Patient Account Number: [id number] Date of Birth/Sex: Treating RN: March 22, 1956 (67 y.o. [name]) Primary Care Provider: Cranford Mon Other Clinician: Referring Provider: Treating Provider/Extender: Shaune Pollack in Treatment: 16 Subjective Chief Complaint Information obtained from Patient 12/24/2022; bilateral lower extremity wounds History of Present Illness (HPI) 12/24/2022 [name] is a 67 year old male with a past medical history of controlled type 2 diabetes on insulin,  lymphedema/chronic venous insufficiency and cirrhosis that presents to the clinic for a 1 year history of nonhealing wounds to his lower extremities bilaterally. He reports the starting spontaneously. He states that he has been following with [facility] and they have been using [facility] antibiotic spray to the legs. He is not wearing compression stockings or wraps. He is also tried Medihoney and collagen in the past with little benefit. He currently denies systemic signs of infection. 5/30; patient presents for follow-up. He has been using triamcinolone cream to the periwound and Santyl to the wound beds. There has been improvement in wound healing. He denies  signs of infection and has [name] issues or complaints today. 6/4; both legs look considerably better. He has a wound on the right lateral lower leg and the left medial lower leg. Significant hemosiderin in the right leg less so on the left. We have been using Hydrofera Blue under compression 6/13; patient presents for follow-up. We have been using antibiotic ointment with Hydrofera Blue under compression therapy. The wounds are smaller. He has [name] issues or complaints today. We discussed ordering juxta lite compression garment wraps T use once his wounds heal as he has hard time putting on o compression stockings. He was agreeable with this. 6/20; patient presents for follow-up. We have been using antibiotic ointment with Hydrofera Blue under compression therapy to the lower extremities bilaterally. Wounds are smaller. He has been approved for PuraPly and was agreeable with placement today. He denies signs of infection. 6/27; patient presents for follow-up. We have been using antibiotic ointment with Hydrofera Blue under compression therapy to the left lower extremity. This wound is healed. He has his juxta lite compression wraps with him today. T the right lateral leg we have been using PuraPly under compression and that wound o is  smaller today. Unfortunately he has developed skin breakdown from the Steri-Strip that was used to hold the PuraPly in place and this has created a new [name], [name] ([id number]) [id number] Page 6 of 10 wound on the right leg. 7/8; patient presents for follow-up. We have been using PuraPly to the right lateral leg wound Under compression wrap. Wound is slightly smaller however original wound used for PuraPly is healed. He unfortunately developed a new wound to the left lower extremity but it looks like this was from potential scratching. He has been using his juxta lite compression here. 716; patient presents for follow-up. We have been using antibiotic ointment with Hydrofera Blue under 3 layer compression to lower extremities bilaterally. Unfortunately he has continued to develop wounds to the left lower extremity. He currently denies signs of infection. 7/22; patient presents for follow-up. He has been using Santyl and Hydrofera Blue with triamcinolone cream under her juxta lite compression wraps daily. [name] new wounds today. Current wounds are stable. Less irritation to the periwound. 7/29; patient presents for follow-up. He has been using Santyl and Hydrofera Blue with triamcinolone cream under the juxta lite compression wraps. He is having a hard time keeping the [facility] in place to the wound beds. He denies signs of infection. 8/5; patient presents for follow-up. Has been using Santyl to the wound beds and triamcinolone cream under the juxta lite compression. He has [name] issues or complaints today. Wounds are overall stable. 8/12; patient presents for follow-up. He has been using Santyl to the wound beds and triamcinolone cream to the periwound under JuxtaLite compression. He is scheduled to see dermatology this week. Wounds are overall stable. 8/19; patient presents for follow-up. He has been using Santyl and PolyMem to the wound beds along with triamcinolone  cream to the periwound under his juxta light compression. He saw dermatology and they obtained a biopsy of the distal medial wound on the left leg. He also had venous reflux studies last week that showed [name] venous reflux on the left and only venous reflux to the CFV with [name] venous reflux throughout the rest of the venous system. 9/3;Patient presents for follow-up. Patient has been using PolyMem silver with Santyl. Patient was seen by dermatology and they did a punch biopsy. 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 Patient  was prescribed silver Silvadene. He has not started this yet. Overall wounds appear well-healing. 9/9; patient presents for follow-up. We have been using PolyMem silver with Santyl. Wounds are smaller. He has been using his compression Velcro wrap daily. He declines debridement today. Patient History Family History Diabetes - Father, Heart Disease - Father. Social History Current every day smoker, Marital Status - Single, Alcohol Use - Never, Drug Use - [name] History, Caffeine Use - Never. Medical History Hematologic/Lymphatic Patient has history of Anemia, Lymphedema Cardiovascular Patient has history of Hypertension Gastrointestinal Patient has history of Cirrhosis - crpytogenic Endocrine Patient has history of Type II Diabetes - 6.2 HgbA1c Medical A Surgical History Notes nd Gastrointestinal GERD Genitourinary stage III CKD Objective Constitutional respirations regular, non-labored and within target range for patient.. Vitals Time Taken: 8:13 AM, Height: 72 in, Weight: 245 lbs, BMI: 33.2, Temperature: 97.9 F, Pulse: 69 bpm, Respiratory Rate: 18 breaths/min, Blood Pressure: 146/71 mmHg. Cardiovascular 2+ dorsalis pedis/posterior tibialis pulses. Psychiatric pleasant and cooperative. General Notes: T the lower extremities bilaterally there are 2 small open wounds On each side with granulation tissue and scant nonviable tissue. [name] signs of o surrounding  infection. Decent edema control. Venous stasis dermatitis. Integumentary (Hair, Skin) Wound #3 status is Open. Original cause of wound was Gradually Appeared. The date acquired was: 01/25/2023. The wound has been in treatment 10 weeks. The wound is located on the Right,Proximal,Anterior Lower Leg. The wound measures 0.5cm length x 0.2cm width x 0.2cm depth; 0.079cm^2 area and 0.016cm^3 volume. There is Fat Layer (Subcutaneous Tissue) exposed. There is [name] tunneling or undermining noted. There is a medium amount of serosanguineous drainage noted. The wound margin is distinct with the outline attached to the wound base. There is [name] granulation within the wound bed. There is a large (67-100%) amount of necrotic tissue within the wound bed including Adherent Slough. The periwound skin appearance did not exhibit: Callus, Crepitus, Excoriation, Induration, Rash, Scarring, Dry/Scaly, Maceration, Atrophie Blanche, Cyanosis, Ecchymosis, Hemosiderin Staining, Mottled, Pallor, [name], [name] ([id number]) [id number] Page 7 of 10 Erythema. Periwound temperature was noted as [name] Abnormality. Wound #4 status is Open. Original cause of wound was Gradually Appeared. The date acquired was: 02/11/2023. The wound has been in treatment 9 weeks. The wound is located on the Left,Distal,Medial Lower Leg. The wound measures 0.6cm length x 1cm width x 0.2cm depth; 0.471cm^2 area and 0.094cm^3 volume. There is [name] tunneling or undermining noted. There is a medium amount of serosanguineous drainage noted. The wound margin is distinct with the outline attached to the wound base. There is large (67-100%) hyper - granulation within the wound bed. There is a small (1-33%) amount of necrotic tissue within the wound bed including Adherent Slough. The periwound skin appearance did not exhibit: Callus, Crepitus, Excoriation, Induration, Rash, Scarring, Dry/Scaly, Maceration, Atrophie Blanche, Cyanosis,  Ecchymosis, Hemosiderin Staining, Mottled, Pallor, Rubor, Erythema. Periwound temperature was noted as [name] Abnormality. Wound #5 status is Open. Original cause of wound was Blister. The date acquired was: 02/19/2023. The wound has been in treatment 7 weeks. The wound is located on the Right,Distal,Lateral Lower Leg. The wound measures 0.4cm length x 0.4cm width x 0.1cm depth; 0.126cm^2 area and 0.013cm^3 volume. There is Fat Layer (Subcutaneous Tissue) exposed. There is [name] tunneling or undermining noted. There is a medium amount of serosanguineous drainage noted. The wound margin is distinct with the outline attached to the wound base. There is medium (34-66%) red, pink granulation within the wound bed. There is a medium (  34-66%) amount of necrotic tissue within the wound bed including Adherent Slough. The periwound skin appearance did not exhibit: Callus, Crepitus, Excoriation, Induration, Rash, Scarring, Dry/Scaly, Maceration, Atrophie Blanche, Cyanosis, Ecchymosis, Hemosiderin Staining, Mottled, Pallor, Rubor, Erythema. Periwound temperature was noted as [name] Abnormality. Wound #6 status is Open. Original cause of wound was Blister. The date acquired was: 02/19/2023. The wound has been in treatment 7 weeks. The wound is located on the Left,Proximal,Posterior Lower Leg. The wound measures 0.5cm length x 1.5cm width x 0.1cm depth; 0.589cm^2 area and 0.059cm^3 volume. There is Fat Layer (Subcutaneous Tissue) exposed. There is [name] tunneling or undermining noted. There is a medium amount of serosanguineous drainage noted. The wound margin is distinct with the outline attached to the wound base. There is small (1-33%) pink granulation within the wound bed. There is a large (67- 100%) amount of necrotic tissue within the wound bed including Adherent Slough. The periwound skin appearance did not exhibit: Callus, Crepitus, Excoriation, Induration, Rash, Scarring, Dry/Scaly, Maceration, Atrophie Blanche, Cyanosis,  Ecchymosis, Hemosiderin Staining, Mottled, Pallor, Rubor, Erythema. Periwound temperature was noted as [name] Abnormality. 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 There is healthy granulation tissue present today. He declines debridement. [name] signs of infection. I recommended continuing with PolyMem silver and Santyl under Velcro compression wrap daily. Follow-up in 1 week. Plan Follow-up Appointments: Return Appointment in 2 weeks. - Dr Leanord Hawking - Room 9 Monday 04/29/23 @ 0900 Other: - Upcoming Dermatology appointment next Wednesday Anesthetic: (In clinic) Topical Lidocaine 4% applied to wound bed Bathing/ Shower/ Hygiene: May shower with protection but do not get wound dressing(s) wet. Protect dressing(s) with water repellant cover (for example, large plastic bag) or a cast cover and may then take shower. Edema Control - Lymphedema / SCD / Other: Elevate legs to the level of the heart or above for 30 minutes daily and/or when sitting for 3-4 times a day throughout the day. Avoid standing for long periods of time. Patient to wear own compression stockings every day. Exercise regularly Compression stocking or Garment 30-40 mm/Hg pressure to: - wear juxatlite HDs both legs. The following medication(s) was prescribed: lidocaine topical 5 % ointment ointment topical once daily was prescribed at facility WOUND #3: - Lower Leg Wound Laterality: Right, Anterior, Proximal Cleanser: Soap and Water 1 x Per Day/15 Days Discharge Instructions: May shower and wash wound with dial  antibacterial soap and water prior to dressing change. Peri-Wound Care: Triamcinolone 15 (g) 1 x Per Day/15 Days Discharge Instructions: Use triamcinolone apply to entire both legs. around the wounds. Prim Dressing: PolyMem Silver Non-Adhesive Dressing, 4.25x4.25 in 1 x Per Day/15 Days ary Discharge Instructions: Apply to wound bed as instructed Prim Dressing: Santyl Ointment 1 x Per Day/15 Days ary Discharge Instructions: Apply nickel thick amount to wound bed as instructed Secondary Dressing: Woven Gauze Sponge, Non-Sterile 4x4 in (Generic) 1 x Per Day/15 Days Discharge Instructions: Apply over primary dressing as directed. Secured With: [employer], 4.5x3.1 (in/yd) (Generic) 1 x Per Day/15 Days Discharge Instructions: Secure with Kerlix as directed. Secured With: 43M Medipore H Soft Cloth Surgical T ape, 4 x 10 (in/yd) (Generic) 1 x Per Day/15 Days Discharge  Instructions: Secure with tape as directed. WOUND #4: - Lower Leg Wound Laterality: Left, Medial, Distal Cleanser: Soap and Water 1 x Per Day/15 Days Discharge Instructions: May shower and wash wound with dial antibacterial soap and water prior to dressing change. Peri-Wound Care: Triamcinolone 15 (g) 1 x Per Day/15 Days Discharge Instructions: Use triamcinolone apply to entire both legs. around the wounds. Prim Dressing: PolyMem Silver Non-Adhesive Dressing, 4.25x4.25 in 1 x Per Day/15 Days ary [name], [name] ([id number]) [phone].pdf Page 8 of 10 Discharge Instructions: Apply to wound bed as instructed Prim Dressing: Santyl Ointment 1 x Per Day/15 Days ary Discharge Instructions: Apply nickel thick amount to wound bed as instructed Secondary Dressing: Woven Gauze Sponge, Non-Sterile 4x4 in (Generic) 1 x Per Day/15 Days Discharge Instructions: Apply over primary dressing as directed. Secured With: [employer], 4.5x3.1 (in/yd) (Generic) 1 x Per Day/15 Days Discharge Instructions: Secure  with Kerlix as directed. Secured With: 51M Medipore H Soft Cloth Surgical T ape, 4 x 10 (in/yd) (Generic) 1 x Per Day/15 Days Discharge Instructions: Secure with tape as directed. WOUND #5: - Lower Leg Wound Laterality: Right, Lateral, Distal Cleanser: Soap and Water 1 x Per Day/15 Days Discharge Instructions: May shower and wash wound with dial antibacterial soap and water prior to dressing change. Peri-Wound Care: Triamcinolone 15 (g) 1 x Per Day/15 Days Discharge Instructions: Use triamcinolone apply to entire both legs. around the wounds. Prim Dressing: PolyMem Silver Non-Adhesive Dressing, 4.25x4.25 in 1 x Per Day/15 Days ary Discharge Instructions: Apply to wound bed as instructed Prim Dressing: Santyl Ointment 1 x Per Day/15 Days ary Discharge Instructions: Apply nickel thick amount to wound bed as instructed Secondary Dressing: Woven Gauze Sponge, Non-Sterile 4x4 in (Generic) 1 x Per Day/15 Days Discharge Instructions: Apply over primary dressing as directed. Secured With: [employer], 4.5x3.1 (in/yd) (Generic) 1 x Per Day/15 Days Discharge Instructions: Secure with Kerlix as directed. Secured With: 51M Medipore H Soft Cloth Surgical T ape, 4 x 10 (in/yd) (Generic) 1 x Per Day/15 Days Discharge Instructions: Secure with tape as directed. WOUND #6: - Lower Leg Wound Laterality: Left, Posterior, Proximal Cleanser: Soap and Water 1 x Per Day/15 Days Discharge Instructions: May shower and wash wound with dial antibacterial soap and water prior to dressing change. Peri-Wound Care: Triamcinolone 15 (g) 1 x Per Day/15 Days Discharge Instructions: Use triamcinolone apply to entire both legs. around the wounds. Prim Dressing: PolyMem Silver Non-Adhesive Dressing, 4.25x4.25 in 1 x Per Day/15 Days ary Discharge Instructions: Apply to wound bed as instructed Prim Dressing: Santyl Ointment 1 x Per Day/15 Days ary Discharge Instructions: Apply nickel thick amount to wound bed as  instructed Secondary Dressing: Woven Gauze Sponge, Non-Sterile 4x4 in (Generic) 1 x Per Day/15 Days Discharge Instructions: Apply over primary dressing as directed. Secured With: [employer], 4.5x3.1 (in/yd) (Generic) 1 x Per Day/15 Days Discharge Instructions: Secure with Kerlix as directed. Secured With: 51M Medipore H Soft Cloth Surgical T ape, 4 x 10 (in/yd) (Generic) 1 x Per Day/15 Days Discharge Instructions: Secure with tape as directed. 1. PolyMem silver with Santyl under compression Velcro wrap daily 2. Follow-up in 1 week Electronic Signature(s) Signed: 04/15/2023 9:39:51 AM By: Geralyn Corwin DO Entered By: Geralyn Corwin on 04/15/2023 06:16:20 -------------------------------------------------------------------------------- HxROS Details Patient Name: Date of Service: [name] [name], [name] [name]. 04/15/2023 8:15 A [name] Medical Record Number: [id number] Patient Account Number: [id number] Date of Birth/Sex: Treating RN: [birth year] (67 y.o. [name]) Primary Care Provider: Cranford Mon Other  Clinician: Referring Provider: Treating Provider/Extender: Shaune Pollack in Treatment: 16 Hematologic/Lymphatic Medical History: Positive for: Anemia; Lymphedema Cardiovascular Medical History: Positive for: Hypertension Gastrointestinal Medical History: Positive for: Cirrhosis - crpytogenic Past Medical History Notes: GERD ROPER, HOEG ([id number]) [id number] Page 9 of 10 Endocrine Medical History: Positive for: Type II Diabetes - 6.2 HgbA1c Time with diabetes: 20 yeatrs Treated with: Insulin Genitourinary Medical History: Past Medical History Notes: stage III CKD Immunizations Pneumococcal Vaccine: Received Pneumococcal Vaccination: [name] Implantable Devices [name] devices added Family and Social History Diabetes: Yes - Father; Heart Disease: Yes - Father; Current every day smoker; Marital Status - Single; Alcohol Use: Never; Drug Use:  [name] History; Caffeine Use: Never; Financial Concerns: [name]; Food, Clothing or Shelter Needs: [name]; Support System Lacking: [name]; Transportation Concerns: [name] Electronic Signature(s) Signed: 04/15/2023 9:39:51 AM By: Geralyn Corwin DO Entered By: Geralyn Corwin on 04/15/2023 06:11:15 -------------------------------------------------------------------------------- SuperBill Details Patient Name: Date of Service: [name] [name], [name] [name]. 04/15/2023 Medical Record Number: [id number] Patient Account Number: [id number] Date of Birth/Sex: Treating RN: 08/31/1955 (67 y.o. [name]) Primary Care Provider: Cranford Mon Other Clinician: Referring Provider: Treating Provider/Extender: Shaune Pollack in Treatment: 16 Diagnosis Coding ICD-10 Codes Code Description [phone] Chronic venous hypertension (idiopathic) with ulcer of bilateral lower extremity L97.818 Non-pressure chronic ulcer of other part of right lower leg with other specified severity L97.828 Non-pressure chronic ulcer of other part of left lower leg with other specified severity L95.0 Livedoid vasculitis I89.0 Lymphedema, not elsewhere classified E11.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 other skin ulcer K74.69 Other cirrhosis of liver Facility Procedures : CPT4 Code: [id number] Description: 99214 - WOUND CARE VISIT-LEV 4 EST PT Modifier: Quantity: 1 Physician Procedures : CPT4 Code Description Modifier [id number] - WC PHYS LEVEL 3 - EST PT ICD-10 Diagnosis Description I87.313 Chronic venous hypertension (idiopathic) with ulcer of bilateral lower extremity L97.818 Non-pressure chronic ulcer of other part of right  lower leg with other specified severity ERNIE, HALAS ([id number]) [id number] L97.828 Non-pressure chronic ulcer of other part of left lower leg with other specified severity E11.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 other skin ulcer Quantity: 1 227.pdf Page 10 of 10 Electronic Signature(s) Signed:  04/15/2023 9:39:51 AM By: Geralyn Corwin DO Signed: 04/16/2023 3:11:25 PM By: Redmond Pulling RN, BSN Entered By: Redmond Pulling on 04/15/2023 06:36:56

## 2023-04-16 NOTE — Progress Notes (Signed)
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 [name] Atrophie Blanche: [name] Crepitus: [name] Cyanosis: [name] Excoriation: [name] Ecchymosis: [name] Induration: [name] Erythema: [name] Rash: [name] Hemosiderin Staining: [name] Scarring: [name] Mottled: [name] Pallor: [name] Moisture Rubor: [name] [name] Abnormalities Noted: [name] Dry / Scaly: [name] Temperature / Pain Maceration: [name] Temperature: [name] Abnormality Electronic Signature(s) Signed: 04/16/2023 3:11:25 PM By: [name] Pulling RN, BSN Entered By: [name] [name] on 04/15/2023 05:21:02 [name] [name] ([id number]) [id number] Page 12 of 12 -------------------------------------------------------------------------------- Vitals Details Patient Name: Date of Service: [name] [name]. 04/15/2023 8:15 A M Medical Record Number: [id number] Patient Account Number: [id number] Date of Birth/Sex: Treating RN: [birth year] (67 y.o. [name] [name] Primary Care Nikoloz Huy: [place] [facility] Other Clinician: Referring Jceon Alverio: Treating Kenasia Scheller/Extender: [name] [name] in Treatment: 16 Vital Signs Time Taken: 08:13 Temperature (F): 97.9 Height (in): 72 Pulse (bpm): 69 Weight (lbs): 245 Respiratory Rate (breaths/min): 18 Body Mass Index (BMI): 33.2 Blood Pressure (mmHg): 146/71 Reference Range: 80 - 120 mg / dl Electronic Signature(s) Signed: 04/16/2023 3:11:25 PM By: [name] Pulling RN, BSN Entered By: [name] [name] on 04/15/2023 05:14:07

## 2023-04-17 NOTE — Progress Notes (Signed)
TOR, SAMMON ([id number]) [id number] Page 1 of 13 Visit Report for 04/09/2023 Chief Complaint Document Details Patient Name: Date of Service: [name] [name]. 04/09/2023 8:45 A M Medical Record Number: [id number] Patient Account Number: [id number] Date of Birth/Sex: Treating RN: July 19, 1956 (67 y.o. M) Primary Care Provider: Cranford Mon Other Clinician: Referring Provider: Treating Provider/Extender: Shaune Pollack in Treatment: 15 Information Obtained from: Patient Chief Complaint 12/24/2022; bilateral lower extremity wounds Electronic Signature(s) Signed: 04/09/2023 2:04:51 PM By: Geralyn Corwin DO Entered By: Geralyn Corwin on 04/09/2023 11:07:59 -------------------------------------------------------------------------------- Debridement Details Patient Name: Date of Service: [name] [name] RRIS M. 04/09/2023 8:45 A M Medical Record Number: [id number] Patient Account Number: [id number] Date of Birth/Sex: Treating RN: November 12, 1955 (67 y.o. [name] Primary Care Provider: Cranford Mon Other Clinician: Referring Provider: Treating Provider/Extender: Shaune Pollack in Treatment: 15 Debridement Performed for Assessment: Wound #3 Right,Proximal,Lateral Lower Leg Performed By: Physician Geralyn Corwin, DO Debridement Type: Debridement Severity of Tissue Pre Debridement: Fat layer exposed Level of Consciousness (Pre-procedure): Awake and Alert Pre-procedure Verification/Time Out Yes - 09:33 Taken: Start Time: 09:35 Pain Control: Lidocaine 4% T opical Solution Percent of Wound Bed Debrided: 100% T Area Debrided (cm): otal 0.09 Tissue and other material debrided: Non-Viable, Slough, Slough Level: Non-Viable Tissue Debridement Description: Selective/Open Wound Instrument: Curette Bleeding: Minimum Hemostasis Achieved: Pressure End Time: 09:40 Procedural Pain: 0 Post Procedural Pain: 0 Response to  Treatment: Procedure was tolerated well Level of Consciousness (Post- Awake and Alert procedure): Post Debridement Measurements of Total Wound Length: (cm) 0.4 Width: (cm) 0.3 Depth: (cm) 0.1 Volume: (cm) 0.009 Character of Wound/Ulcer Post Debridement: Improved [name] [name], [name] [name] ([id number]) [id number] Page 2 of 13 Severity of Tissue Post Debridement: Fat layer exposed Post Procedure Diagnosis Same as Pre-procedure Notes Scribed for Dr Mikey Bussing by Brenton Grills RN. Electronic Signature(s) Signed: 04/09/2023 2:04:51 PM By: Geralyn Corwin DO Signed: 04/17/2023 7:59:39 AM By: Brenton Grills Entered By: Brenton Grills on 04/09/2023 09:35:15 -------------------------------------------------------------------------------- Debridement Details Patient Name: Date of Service: [name] [name] [name], [name] RRIS M. 04/09/2023 8:45 A M Medical Record Number: [id number] Patient Account Number: [id number] Date of Birth/Sex: Treating RN: 04/25/1956 (67 y.o. [name] Primary Care Provider: Cranford Mon Other Clinician: Referring Provider: Treating Provider/Extender: Shaune Pollack in Treatment: 15 Debridement Performed for Assessment: Wound #4 Left,Distal,Medial Lower Leg Performed By: Physician Geralyn Corwin, DO Debridement Type: Debridement Severity of Tissue Pre Debridement: Fat layer exposed Level of Consciousness (Pre-procedure): Awake and Alert Pre-procedure Verification/Time Out Yes - 09:33 Taken: Start Time: 09:35 Pain Control: Lidocaine 4% T opical Solution Percent of Wound Bed Debrided: 100% T Area Debrided (cm): otal 0.01 Tissue and other material debrided: Non-Viable, Slough, Slough Level: Non-Viable Tissue Debridement Description: Selective/Open Wound Instrument: Curette Bleeding: Minimum Hemostasis Achieved: Pressure End Time: 09:40 Procedural Pain: 0 Post Procedural Pain: 0 Response to Treatment: Procedure was tolerated  well Level of Consciousness (Post- Awake and Alert procedure): Post Debridement Measurements of Total Wound Length: (cm) 0.1 Width: (cm) 0.1 Depth: (cm) 0.1 Volume: (cm) 0.001 Character of Wound/Ulcer Post Debridement: Improved Severity of Tissue Post Debridement: Fat layer exposed Post Procedure Diagnosis Same as Pre-procedure Notes Scribed for Dr Mikey Bussing by Brenton Grills RN. Electronic Signature(s) Signed: 04/09/2023 2:04:51 PM By: Geralyn Corwin DO Signed: 04/17/2023 7:59:39 AM By: Brenton Grills Entered By: Brenton Grills on 04/09/2023 09:36:04 [name] [name] ([id number]) [id number] Page 3 of 13 -------------------------------------------------------------------------------- Debridement Details Patient Name: Date of Service: [name]  Bruce [name] RRIS M. 04/09/2023 8:45 A M Medical Record Number: [id number] Patient Account Number: [id number] Date of Birth/Sex: Treating RN: 23-Jan-1956 (67 y.o. [name] Primary Care Provider: Cranford Mon Other Clinician: Referring Provider: Treating Provider/Extender: Shaune Pollack in Treatment: 15 Debridement Performed for Assessment: Wound #5 Right,Distal,Lateral Lower Leg Performed By: Physician Geralyn Corwin, DO Debridement Type: Debridement Severity of Tissue Pre Debridement: Fat layer exposed Level of Consciousness (Pre-procedure): Awake and Alert Pre-procedure Verification/Time Out Yes - 09:33 Taken: Start Time: 09:35 Pain Control: Lidocaine 4% T opical Solution Percent of Wound Bed Debrided: 100% T Area Debrided (cm): otal 0.16 Tissue and other material debrided: Non-Viable, Slough, Slough Level: Non-Viable Tissue Debridement Description: Selective/Open Wound Instrument: Curette Bleeding: Minimum Hemostasis Achieved: Pressure End Time: 09:40 Procedural Pain: 0 Post Procedural Pain: 0 Response to Treatment: Procedure was tolerated well Level of Consciousness (Post- Awake  and Alert procedure): Post Debridement Measurements of Total Wound Length: (cm) 0.4 Width: (cm) 0.5 Depth: (cm) 0.1 Volume: (cm) 0.016 Character of Wound/Ulcer Post Debridement: Improved Severity of Tissue Post Debridement: Fat layer exposed Post Procedure Diagnosis Same as Pre-procedure Notes Scribed for Dr Mikey Bussing by Brenton Grills RN. Electronic Signature(s) Signed: 04/09/2023 2:04:51 PM By: Geralyn Corwin DO Signed: 04/17/2023 7:59:39 AM By: Brenton Grills Entered By: Brenton Grills on 04/09/2023 09:36:42 -------------------------------------------------------------------------------- Debridement Details Patient Name: Date of Service: [name] [name] RRIS M. 04/09/2023 8:45 A M Medical Record Number: [id number] Patient Account Number: [id number] Date of Birth/Sex: Treating RN: November 03, 1955 (67 y.o. [name] Primary Care Provider: Cranford Mon Other Clinician: Referring Provider: Treating Provider/Extender: Shaune Pollack in Treatment: 15 Debridement Performed for Assessment: Wound #6 Left,Proximal,Posterior Lower Leg Performed By: Physician Geralyn Corwin, DO [place], [place] ([id number]) [id number] Page 4 of 13 Debridement Type: Debridement Severity of Tissue Pre Debridement: Fat layer exposed Level of Consciousness (Pre-procedure): Awake and Alert Pre-procedure Verification/Time Out Yes - 09:33 Taken: Start Time: 09:35 Pain Control: Lidocaine 4% T opical Solution Percent of Wound Bed Debrided: 100% T Area Debrided (cm): otal 0.42 Tissue and other material debrided: Non-Viable, Slough, Slough Level: Non-Viable Tissue Debridement Description: Selective/Open Wound Instrument: Curette Bleeding: Minimum Hemostasis Achieved: Pressure End Time: 09:40 Procedural Pain: 0 Post Procedural Pain: 0 Response to Treatment: Procedure was tolerated well Level of Consciousness (Post- Awake and Alert procedure): Post Debridement  Measurements of Total Wound Length: (cm) 0.6 Width: (cm) 0.9 Depth: (cm) 0.1 Volume: (cm) 0.042 Character of Wound/Ulcer Post Debridement: Improved Severity of Tissue Post Debridement: Fat layer exposed Post Procedure Diagnosis Same as Pre-procedure Notes Scribed for Dr Mikey Bussing by Brenton Grills RN. Electronic Signature(s) Signed: 04/09/2023 2:04:51 PM By: Geralyn Corwin DO Signed: 04/17/2023 7:59:39 AM By: Brenton Grills Entered By: Brenton Grills on 04/09/2023 09:37:19 -------------------------------------------------------------------------------- HPI Details Patient Name: Date of Service: [name] [name] [name], [name] 04/09/2023 8:45 A M Medical Record Number: [id number] Patient Account Number: [id number] Date of Birth/Sex: Treating RN: 1955-11-23 (67 y.o. M) Primary Care Provider: Cranford Mon Other Clinician: Referring Provider: Treating Provider/Extender: Shaune Pollack in Treatment: 15 History of Present Illness HPI Description: 12/24/2022 [name] [name] is a 67 year old male with a past medical history of controlled type 2 diabetes on insulin, lymphedema/chronic venous insufficiency and cirrhosis that presents to the clinic for a 1 year history of nonhealing wounds to his lower extremities bilaterally. He reports the starting spontaneously. He states that he has been following with [facility] wound care center and they have  been using [facility] antibiotic spray to the legs. He is not wearing compression stockings or wraps. He is also tried Medihoney and collagen in the past with little benefit. He currently denies systemic signs of infection. 5/30; patient presents for follow-up. He has been using triamcinolone cream to the periwound and Santyl to the wound beds. There has been improvement in wound healing. He denies signs of infection and has no issues or complaints today. 6/4; both legs look considerably better. He has a wound on the right lateral lower leg and the left  medial lower leg. Significant hemosiderin in the right leg less so on the left. We have been using Hydrofera Blue under compression 6/13; patient presents for follow-up. We have been using antibiotic ointment with Hydrofera Blue under compression therapy. The wounds are smaller. He has no issues or complaints today. We discussed ordering juxta lite compression garment wraps T use once his wounds heal as he has hard time putting on o compression stockings. He was agreeable with this. 6/20; patient presents for follow-up. We have been using antibiotic ointment with Hydrofera Blue under compression therapy to the lower extremities bilaterally. RASHEE, GLADBACH ([id number]) [id number] Page 5 of 13 Wounds are smaller. He has been approved for PuraPly and was agreeable with placement today. He denies signs of infection. 6/27; patient presents for follow-up. We have been using antibiotic ointment with Hydrofera Blue under compression therapy to the left lower extremity. This wound is healed. He has his juxta lite compression wraps with him today. T the right lateral leg we have been using PuraPly under compression and that wound o is smaller today. Unfortunately he has developed skin breakdown from the Steri-Strip that was used to hold the PuraPly in place and this has created a new wound on the right leg. 7/8; patient presents for follow-up. We have been using PuraPly to the right lateral leg wound Under compression wrap. Wound is slightly smaller however original wound used for PuraPly is healed. He unfortunately developed a new wound to the left lower extremity but it looks like this was from potential scratching. He has been using his juxta lite compression here. 716; patient presents for follow-up. We have been using antibiotic ointment with Hydrofera Blue under 3 layer compression to lower extremities bilaterally. Unfortunately he has continued to develop wounds to the  left lower extremity. He currently denies signs of infection. 7/22; patient presents for follow-up. He has been using Santyl and Hydrofera Blue with triamcinolone cream under her juxta lite compression wraps daily. No new wounds today. Current wounds are stable. Less irritation to the periwound. 7/29; patient presents for follow-up. He has been using Santyl and Hydrofera Blue with triamcinolone cream under the juxta lite compression wraps. He is having a hard time keeping the [facility] in place to the wound beds. He denies signs of infection. 8/5; patient presents for follow-up. Has been using Santyl to the wound beds and triamcinolone cream under the juxta lite compression. He has no issues or complaints today. Wounds are overall stable. 8/12; patient presents for follow-up. He has been using Santyl to the wound beds and triamcinolone cream to the periwound under JuxtaLite compression. He is scheduled to see dermatology this week. Wounds are overall stable. 8/19; patient presents for follow-up. He has been using Santyl and PolyMem to the wound beds along with triamcinolone cream to the periwound under his juxta light compression. He saw dermatology and they obtained a biopsy of the distal medial wound on the left  leg. He also had venous reflux studies last week that showed no venous reflux on the left and only venous reflux to the CFV with no venous reflux throughout the rest of the venous system. 9/3;Patient presents for follow-up. Patient has been using PolyMem silver with Santyl. Patient was seen by dermatology and they did a punch biopsy. 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 Patient was prescribed silver Silvadene. He has not started this yet. Overall wounds appear well-healing. Electronic Signature(s) Signed: 04/09/2023 2:04:51 PM By: Geralyn Corwin DO Entered By: Geralyn Corwin on 04/09/2023  11:09:25 -------------------------------------------------------------------------------- Physical Exam Details Patient Name: Date of Service: [name] 22, [name] 04/09/2023 8:45 A M Medical Record Number: [id number] Patient Account Number: [id number] Date of Birth/Sex: Treating RN: August 19, 1955 (67 y.o. M) Primary Care Provider: Cranford Mon Other Clinician: Referring Provider: Treating Provider/Extender: Shaune Pollack in Treatment: 15 Constitutional respirations regular, non-labored and within target range for patient.. Cardiovascular 2+ dorsalis pedis/posterior tibialis pulses. Psychiatric pleasant and cooperative. Notes T the right lower extremity there is a small open wound with granulation tissue and nonviable tissue. T the left lower extremity there are 3 open wounds with o o granulation tissue and also nonviable tissue. No signs of surrounding infection. Decent edema control. Venous stasis dermatitis. Electronic Signature(s) Signed: 04/09/2023 2:04:51 PM By: Geralyn Corwin DO Entered By: Geralyn Corwin on 04/09/2023 11:18:22 [name] [name] ([id number]) [id number] Page 6 of 13 -------------------------------------------------------------------------------- Physician Orders Details Patient Name: Date of Service: [name] [name]. 04/09/2023 8:45 A M Medical Record Number: [id number] Patient Account Number: [id number] Date of Birth/Sex: Treating RN: 1956-03-07 (66 y.o. [name] Primary Care Provider: Cranford Mon Other Clinician: Referring Provider: Treating Provider/Extender: Shaune Pollack in Treatment: 15 Verbal / Phone Orders: No Diagnosis Coding Follow-up Appointments ppointment in 1 week. - Monday 04/16/23 - please make appt. Return A ppointment in 2 weeks. - Dr Mikey Bussing Return A Other: - Upcoming Dermatology appointment next Wednesday Anesthetic (In clinic) Topical Lidocaine 4%  applied to wound bed Bathing/ Shower/ Hygiene May shower with protection but do not get wound dressing(s) wet. Protect dressing(s) with water repellant cover (for example, large plastic bag) or a cast cover and may then take shower. Edema Control - Lymphedema / SCD / Other Elevate legs to the level of the heart or above for 30 minutes daily and/or when sitting for 3-4 times a day throughout the day. Avoid standing for long periods of time. Patient to wear own compression stockings every day. Exercise regularly Compression stocking or Garment 30-40 mm/Hg pressure to: - wear juxatlite HDs both legs. Wound Treatment Wound #3 - Lower Leg Wound Laterality: Right, Lateral, Proximal Cleanser: Soap and Water 1 x Per Day/15 Days Discharge Instructions: May shower and wash wound with dial antibacterial soap and water prior to dressing change. Peri-Wound Care: Triamcinolone 15 (g) 1 x Per Day/15 Days Discharge Instructions: Use triamcinolone apply to entire both legs. around the wounds. Prim Dressing: PolyMem Silver Non-Adhesive Dressing, 4.25x4.25 in 1 x Per Day/15 Days ary Discharge Instructions: Apply to wound bed as instructed Prim Dressing: Santyl Ointment 1 x Per Day/15 Days ary Discharge Instructions: Apply nickel thick amount to wound bed as instructed Secondary Dressing: Woven Gauze Sponge, Non-Sterile 4x4 in (Generic) 1 x Per Day/15 Days Discharge Instructions: Apply over primary dressing as directed. Secured With: [employer], 4.5x3.1 (in/yd) (Generic) 1 x Per Day/15 Days Discharge Instructions: Secure with Kerlix as directed. Secured With: [employer]  Medipore H Soft Cloth Surgical T ape, 4 x 10 (in/yd) (Generic) 1 x Per Day/15 Days Discharge Instructions: Secure with tape as directed. Wound #4 - Lower Leg Wound Laterality: Left, Medial, Distal Cleanser: Soap and Water 1 x Per Day/15 Days Discharge Instructions: May shower and wash wound with dial antibacterial soap and water prior to  dressing change. Peri-Wound Care: Triamcinolone 15 (g) 1 x Per Day/15 Days Discharge Instructions: Use triamcinolone apply to entire both legs. around the wounds. Prim Dressing: PolyMem Silver Non-Adhesive Dressing, 4.25x4.25 in 1 x Per Day/15 Days ary Discharge Instructions: Apply to wound bed as instructed Prim Dressing: Santyl Ointment 1 x Per Day/15 Days ary Discharge Instructions: Apply nickel thick amount to wound bed as instructed Secondary Dressing: Woven Gauze Sponge, Non-Sterile 4x4 in (Generic) 1 x Per Day/15 Days Discharge Instructions: Apply over primary dressing as directed. Secured With: [employer], 4.5x3.1 (in/yd) (Generic) 1 x Per Day/15 Days Discharge Instructions: Secure with Kerlix as directed. Secured With: 9M Medipore H Soft Cloth Surgical T ape, 4 x 10 (in/yd) (Generic) 1 x Per Day/15 Days Discharge Instructions: Secure with tape as directed. Wound #5 - Lower Leg Wound Laterality: Right, Lateral, Distal [name] [name], [name] [name] ([id number]) [id number] Page 7 of 13 Cleanser: Soap and Water 1 x Per Day/15 Days Discharge Instructions: May shower and wash wound with dial antibacterial soap and water prior to dressing change. Peri-Wound Care: Triamcinolone 15 (g) 1 x Per Day/15 Days Discharge Instructions: Use triamcinolone apply to entire both legs. around the wounds. Prim Dressing: PolyMem Silver Non-Adhesive Dressing, 4.25x4.25 in 1 x Per Day/15 Days ary Discharge Instructions: Apply to wound bed as instructed Prim Dressing: Santyl Ointment 1 x Per Day/15 Days ary Discharge Instructions: Apply nickel thick amount to wound bed as instructed Secondary Dressing: Woven Gauze Sponge, Non-Sterile 4x4 in (Generic) 1 x Per Day/15 Days Discharge Instructions: Apply over primary dressing as directed. Secured With: [employer], 4.5x3.1 (in/yd) (Generic) 1 x Per Day/15 Days Discharge Instructions: Secure with Kerlix as directed. Secured  With: 9M Medipore H Soft Cloth Surgical T ape, 4 x 10 (in/yd) (Generic) 1 x Per Day/15 Days Discharge Instructions: Secure with tape as directed. Wound #6 - Lower Leg Wound Laterality: Left, Posterior, Proximal Cleanser: Soap and Water 1 x Per Day/15 Days Discharge Instructions: May shower and wash wound with dial antibacterial soap and water prior to dressing change. Peri-Wound Care: Triamcinolone 15 (g) 1 x Per Day/15 Days Discharge Instructions: Use triamcinolone apply to entire both legs. around the wounds. Prim Dressing: PolyMem Silver Non-Adhesive Dressing, 4.25x4.25 in 1 x Per Day/15 Days ary Discharge Instructions: Apply to wound bed as instructed Prim Dressing: Santyl Ointment 1 x Per Day/15 Days ary Discharge Instructions: Apply nickel thick amount to wound bed as instructed Secondary Dressing: Woven Gauze Sponge, Non-Sterile 4x4 in (Generic) 1 x Per Day/15 Days Discharge Instructions: Apply over primary dressing as directed. Secured With: [employer], 4.5x3.1 (in/yd) (Generic) 1 x Per Day/15 Days Discharge Instructions: Secure with Kerlix as directed. Secured With: 9M Medipore H Soft Cloth Surgical T ape, 4 x 10 (in/yd) (Generic) 1 x Per Day/15 Days Discharge Instructions: Secure with tape as directed. Electronic Signature(s) Signed: 04/09/2023 2:04:51 PM By: Geralyn Corwin DO Entered By: Geralyn Corwin on 04/09/2023 11:20:14 -------------------------------------------------------------------------------- Problem List Details Patient Name: Date of Service: [name] [name] [name], [name] 04/09/2023 8:45 A M Medical Record Number: [id number] Patient Account Number: [id number] Date of Birth/Sex: Treating RN: 1956/02/14 (67 y.o. 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 12/24/2022 No Yes severity L97.828 Non-pressure chronic ulcer of other part of left lower leg with other specified 12/24/2022 No Yes severity 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 04/09/2023 8:45 A M Medical Record Number: [id number] Patient Account Number: [id number] Date of Birth/Sex: Treating RN: 11-08-1955 (67 y.o. M) Primary Care Provider: Cranford Mon Other Clinician: Referring Provider: Treating Provider/Extender: Shaune Pollack in Treatment: 15 Subjective Chief Complaint Information obtained from Patient 12/24/2022; bilateral lower extremity wounds History of Present Illness (HPI) 12/24/2022 [name] is a 67 year old male with a past medical history of controlled type 2 diabetes on insulin, lymphedema/chronic venous insufficiency and cirrhosis that presents to the clinic for a 1 year history of nonhealing wounds to his lower extremities bilaterally. He reports the starting spontaneously. He states that he has been following with [facility] care center and they have been using [facility] antibiotic spray to the legs. He is not wearing compression stockings or wraps. He is  also tried Medihoney and collagen in the past with little benefit. He currently denies systemic signs of infection. 5/30; patient presents for follow-up. He has been using triamcinolone cream to the periwound and Santyl to the wound beds. There has been improvement in wound healing. He denies signs of infection and has no issues or complaints today. 6/4; both legs look considerably better. He has a wound on the right lateral lower leg and the left medial lower leg. Significant hemosiderin in the right leg less so on the left. We have been using Hydrofera Blue under compression 6/13; patient presents for follow-up. We have been using antibiotic ointment with Hydrofera Blue under compression therapy. The wounds are smaller. He has no issues or complaints today. We discussed ordering juxta lite compression garment wraps T use once his wounds heal as he has hard time putting on o compression stockings. He was agreeable with this. 6/20; patient presents for follow-up. We have been using antibiotic ointment with Hydrofera Blue under compression therapy to the lower extremities bilaterally. Wounds are smaller. He has been approved for PuraPly and was agreeable with placement today. He denies signs of infection. [name] [name], [name] [name] ([id number]) [id number] Page 9 of 13 6/27; patient presents for follow-up. We have been using antibiotic ointment with Hydrofera Blue under compression therapy to the left lower extremity. This wound is healed. He has his juxta lite compression wraps with him today. T the right lateral leg we have been using PuraPly under compression and that wound o is smaller today. Unfortunately he has developed skin breakdown from the Steri-Strip that was used to hold the PuraPly in place and this has created a new wound on the right leg. 7/8; patient presents for follow-up. We have been using PuraPly  to the right lateral leg wound Under compression wrap. Wound is  slightly smaller however original wound used for PuraPly is healed. He unfortunately developed a new wound to the left lower extremity but it looks like this was from potential scratching. He has been using his juxta lite compression here. 716; patient presents for follow-up. We have been using antibiotic ointment with Hydrofera Blue under 3 layer compression to lower extremities bilaterally. Unfortunately he has continued to develop wounds to the left lower extremity. He currently denies signs of infection. 7/22; patient presents for follow-up. He has been using Santyl and Hydrofera Blue with triamcinolone cream under her juxta lite compression wraps daily. No new wounds today. Current wounds are stable. Less irritation to the periwound. 7/29; patient presents for follow-up. He has been using Santyl and Hydrofera Blue with triamcinolone cream under the juxta lite compression wraps. He is having a hard time keeping the [facility] in place to the wound beds. He denies signs of infection. 8/5; patient presents for follow-up. Has been using Santyl to the wound beds and triamcinolone cream under the juxta lite compression. He has no issues or complaints today. Wounds are overall stable. 8/12; patient presents for follow-up. He has been using Santyl to the wound beds and triamcinolone cream to the periwound under JuxtaLite compression. He is scheduled to see dermatology this week. Wounds are overall stable. 8/19; patient presents for follow-up. He has been using Santyl and PolyMem to the wound beds along with triamcinolone cream to the periwound under his juxta light compression. He saw dermatology and they obtained a biopsy of the distal medial wound on the left leg. He also had venous reflux studies last week that showed no venous reflux on the left and only venous reflux to the CFV with no venous reflux throughout the rest of the venous system. 9/3;Patient presents for follow-up. Patient has been  using PolyMem silver with Santyl. Patient was seen by dermatology and they did a punch biopsy. 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 Patient was prescribed silver Silvadene. He has not started this yet. Overall wounds appear well-healing. Patient History Family History Diabetes - Father, Heart Disease - Father. Social History Current every day smoker, Marital Status - Single, Alcohol Use - Never, Drug Use - No History, Caffeine Use - Never. Medical History Hematologic/Lymphatic Patient has history of Anemia, Lymphedema Cardiovascular Patient has history of Hypertension Gastrointestinal Patient has history of Cirrhosis - crpytogenic Endocrine Patient has history of Type II Diabetes - 6.2 HgbA1c Medical A Surgical History Notes nd Gastrointestinal GERD Genitourinary stage III CKD Objective Constitutional respirations regular, non-labored and within target range for patient.. Vitals Time Taken: 8:39 AM, Height: 72 in, Weight: 245 lbs, BMI: 33.2, Temperature: 97.7 F, Pulse: 49 bpm, Respiratory Rate: 18 breaths/min, Blood Pressure: 141/55 mmHg. Cardiovascular 2+ dorsalis pedis/posterior tibialis pulses. Psychiatric pleasant and cooperative. General Notes: T the right lower extremity there is a small open wound with granulation tissue and nonviable tissue. T the left lower extremity there are 3 open o o wounds with granulation tissue and also nonviable tissue. No signs of surrounding infection. Decent edema control. Venous stasis dermatitis. Integumentary (Hair, Skin) Wound #3 status is Open. Original cause of wound was Gradually Appeared. The date acquired was: 01/25/2023. The wound has been in treatment 9 weeks. The wound is located on the Right,Proximal,Lateral Lower Leg. The wound measures 0.4cm length x 0.3cm width x 0.1cm depth; 0.094cm^2 area and 0.009cm^3 volume. There is Fat Layer (Subcutaneous Tissue) exposed. There is  no tunneling or undermining noted. There is a  medium amount of serosanguineous drainage noted. The wound margin is distinct with the outline attached to the wound base. There is no granulation within the wound bed. There is a large (67- 100%) amount of necrotic tissue within the wound bed including Adherent Slough. The periwound skin appearance did not exhibit: Callus, Crepitus, Excoriation, Induration, Rash, Scarring, Dry/Scaly, Maceration, Atrophie Blanche, Cyanosis, Ecchymosis, Hemosiderin Staining, Mottled, Pallor, Rubor, Erythema. [name] [name], [name] [name] ([id number]) [id number] Page 10 of 13 Periwound temperature was noted as No Abnormality. Wound #4 status is Open. Original cause of wound was Gradually Appeared. The date acquired was: 02/11/2023. The wound has been in treatment 8 weeks. The wound is located on the Left,Distal,Medial Lower Leg. The wound measures 0.1cm length x 0.1cm width x 0.1cm depth; 0.008cm^2 area and 0.001cm^3 volume. There is no tunneling or undermining noted. There is a medium amount of serosanguineous drainage noted. The wound margin is distinct with the outline attached to the wound base. There is no granulation within the wound bed. There is no necrotic tissue within the wound bed. The periwound skin appearance did not exhibit: Callus, Crepitus, Excoriation, Induration, Rash, Scarring, Dry/Scaly, Maceration, Atrophie Blanche, Cyanosis, Ecchymosis, Hemosiderin Staining, Mottled, Pallor, Rubor, Erythema. Periwound temperature was noted as No Abnormality. Wound #5 status is Open. Original cause of wound was Blister. The date acquired was: 02/19/2023. The wound has been in treatment 7 weeks. The wound is located on the Right,Distal,Lateral Lower Leg. The wound measures 0.4cm length x 0.5cm width x 0.1cm depth; 0.157cm^2 area and 0.016cm^3 volume. There is Fat Layer (Subcutaneous Tissue) exposed. There is no tunneling or undermining noted. There is a medium amount of serosanguineous drainage noted.  The wound margin is distinct with the outline attached to the wound base. There is no granulation within the wound bed. There is a large (67-100%) amount of necrotic tissue within the wound bed including Adherent Slough. The periwound skin appearance did not exhibit: Callus, Crepitus, Excoriation, Induration, Rash, Scarring, Dry/Scaly, Maceration, Atrophie Blanche, Cyanosis, Ecchymosis, Hemosiderin Staining, Mottled, Pallor, Rubor, Erythema. Periwound temperature was noted as No Abnormality. Wound #6 status is Open. Original cause of wound was Blister. The date acquired was: 02/19/2023. The wound has been in treatment 7 weeks. The wound is located on the Left,Proximal,Posterior Lower Leg. The wound measures 0.6cm length x 0.9cm width x 0.1cm depth; 0.424cm^2 area and 0.042cm^3 volume. There is Fat Layer (Subcutaneous Tissue) exposed. There is no tunneling or undermining noted. There is a medium amount of serosanguineous drainage noted. The wound margin is distinct with the outline attached to the wound base. There is small (1-33%) pink granulation within the wound bed. There is a large (67- 100%) amount of necrotic tissue within the wound bed including Adherent Slough. The periwound skin appearance did not exhibit: Callus, Crepitus, Excoriation, Induration, Rash, Scarring, Dry/Scaly, Maceration, Atrophie Blanche, Cyanosis, Ecchymosis, Hemosiderin Staining, Mottled, Pallor, Rubor, Erythema. Periwound temperature was noted as No Abnormality. 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 Since he is doing well with PolyMem silver and Santyl I  recommended continuing this. If he stalls that we can try the silver Silvadene which was prescribed by dermatology. Continue with compression Velcro wrap. Follow-up in 1 week. Procedures Wound #3 Pre-procedure diagnosis of Wound #3 is a Diabetic Wound/Ulcer of the Lower Extremity located on the Right,Proximal,Lateral Lower Leg .Severity of Tissue Pre Debridement is: Fat layer exposed. There was a Selective/Open Wound Non-Viable Tissue Debridement with a total area of 0.09 sq cm performed by Geralyn Corwin, DO. With the following instrument(s): Curette to remove Non-Viable tissue/material. Material removed includes [facility] after achieving pain control using Lidocaine 4% Topical Solution. No specimens were taken. A time out was conducted at 09:33, prior to the start of the procedure. A Minimum amount of bleeding was controlled with Pressure. The procedure was tolerated well with a pain level of 0 throughout and a pain level of 0 following the procedure. Post Debridement Measurements: 0.4cm length x 0.3cm width x 0.1cm depth; 0.009cm^3 volume. Character of Wound/Ulcer Post Debridement is improved. Severity of Tissue Post Debridement is: Fat layer exposed. Post procedure Diagnosis Wound #3: Same as Pre-Procedure General Notes: Scribed for Dr Mikey Bussing by Brenton Grills RN.Marland Kitchen Wound #4 Pre-procedure diagnosis of Wound #4 is a Diabetic Wound/Ulcer of the Lower Extremity located on the Left,Distal,Medial Lower Leg .Severity of Tissue Pre Debridement is: Fat layer exposed. There was a Selective/Open Wound Non-Viable Tissue Debridement with a total area of 0.01 sq cm performed by Geralyn Corwin, DO. With the following instrument(s): Curette to remove Non-Viable tissue/material. Material removed includes [name] after achieving pain control using Lidocaine 4%  Topical Solution. No specimens were taken. A time out was conducted at 09:33, prior to the start of the procedure. A Minimum amount of bleeding was controlled with Pressure. The procedure was tolerated well with a pain level of 0 throughout and a pain level of 0 following the procedure. Post Debridement Measurements: 0.1cm length x 0.1cm width x 0.1cm depth; 0.001cm^3 volume. Character of Wound/Ulcer Post Debridement is improved. Severity of Tissue Post Debridement is: Fat layer exposed. Post procedure Diagnosis Wound #4: Same as Pre-Procedure General Notes: Scribed for Dr Mikey Bussing by Brenton Grills RN.Marland Kitchen Wound #5 Pre-procedure diagnosis of Wound #5 is a Diabetic Wound/Ulcer of the Lower Extremity located on the Right,Distal,Lateral Lower Leg .Severity of Tissue Pre Debridement is: Fat layer exposed. There was a Selective/Open Wound Non-Viable Tissue Debridement with a total area of 0.16 sq cm performed by Geralyn Corwin, DO. With the following instrument(s): Curette to remove Non-Viable tissue/material. Material removed includes [facility] after achieving pain control using Lidocaine 4% Topical Solution. No specimens were taken. A time out was conducted at 09:33, prior to the start of the procedure. A Minimum amount of bleeding was controlled with Pressure. The procedure was tolerated well with a pain level of 0 throughout and a pain level of 0 following the procedure. Post Debridement Measurements: 0.4cm length x 0.5cm width x 0.1cm depth; 0.016cm^3 volume. Character of Wound/Ulcer Post Debridement is improved. Severity of Tissue Post Debridement is: Fat layer exposed. Post procedure Diagnosis Wound #5: Same as Pre-Procedure General Notes: Scribed for Dr Mikey Bussing by Brenton Grills RN.Marland Kitchen Wound #6 Pre-procedure diagnosis of Wound #6 is a Diabetic Wound/Ulcer of the Lower Extremity located on the Left,Proximal,Posterior Lower Leg .Severity of Tissue Pre Debridement is: Fat layer exposed. There was a  Selective/Open Wound Non-Viable Tissue Debridement with a total area of 0.42 sq cm performed by Geralyn Corwin, DO. With the following instrument(s): Curette to remove Non-Viable tissue/material. Material removed includes [facility] after achieving  pain [name] [name], [name] [name] ([id number]) [id number] Page 11 of 13 control using Lidocaine 4% Topical Solution. No specimens were taken. A time out was conducted at 09:33, prior to the start of the procedure. A Minimum amount of bleeding was controlled with Pressure. The procedure was tolerated well with a pain level of 0 throughout and a pain level of 0 following the procedure. Post Debridement Measurements: 0.6cm length x 0.9cm width x 0.1cm depth; 0.042cm^3 volume. Character of Wound/Ulcer Post Debridement is improved. Severity of Tissue Post Debridement is: Fat layer exposed. Post procedure Diagnosis Wound #6: Same as Pre-Procedure General Notes: Scribed for Dr Mikey Bussing by Brenton Grills RN.Marland Kitchen Plan Follow-up Appointments: Return Appointment in 1 week. - Monday 04/16/23 - please make appt. Return Appointment in 2 weeks. - Dr Mikey Bussing Other: - Upcoming Dermatology appointment next Wednesday Anesthetic: (In clinic) Topical Lidocaine 4% applied to wound bed Bathing/ Shower/ Hygiene: May shower with protection but do not get wound dressing(s) wet. Protect dressing(s) with water repellant cover (for example, large plastic bag) or a cast cover and may then take shower. Edema Control - Lymphedema / SCD / Other: Elevate legs to the level of the heart or above for 30 minutes daily and/or when sitting for 3-4 times a day throughout the day. Avoid standing for long periods of time. Patient to wear own compression stockings every day. Exercise regularly Compression stocking or Garment 30-40 mm/Hg pressure to: - wear juxatlite HDs both legs. WOUND #3: - Lower Leg Wound Laterality: Right, Lateral, Proximal Cleanser: Soap and Water 1 x Per  Day/15 Days Discharge Instructions: May shower and wash wound with dial antibacterial soap and water prior to dressing change. Peri-Wound Care: Triamcinolone 15 (g) 1 x Per Day/15 Days Discharge Instructions: Use triamcinolone apply to entire both legs. around the wounds. Prim Dressing: PolyMem Silver Non-Adhesive Dressing, 4.25x4.25 in 1 x Per Day/15 Days ary Discharge Instructions: Apply to wound bed as instructed Prim Dressing: Santyl Ointment 1 x Per Day/15 Days ary Discharge Instructions: Apply nickel thick amount to wound bed as instructed Secondary Dressing: Woven Gauze Sponge, Non-Sterile 4x4 in (Generic) 1 x Per Day/15 Days Discharge Instructions: Apply over primary dressing as directed. Secured With: [employer], 4.5x3.1 (in/yd) (Generic) 1 x Per Day/15 Days Discharge Instructions: Secure with Kerlix as directed. Secured With: 755M Medipore H Soft Cloth Surgical T ape, 4 x 10 (in/yd) (Generic) 1 x Per Day/15 Days Discharge Instructions: Secure with tape as directed. WOUND #4: - Lower Leg Wound Laterality: Left, Medial, Distal Cleanser: Soap and Water 1 x Per Day/15 Days Discharge Instructions: May shower and wash wound with dial antibacterial soap and water prior to dressing change. Peri-Wound Care: Triamcinolone 15 (g) 1 x Per Day/15 Days Discharge Instructions: Use triamcinolone apply to entire both legs. around the wounds. Prim Dressing: PolyMem Silver Non-Adhesive Dressing, 4.25x4.25 in 1 x Per Day/15 Days ary Discharge Instructions: Apply to wound bed as instructed Prim Dressing: Santyl Ointment 1 x Per Day/15 Days ary Discharge Instructions: Apply nickel thick amount to wound bed as instructed Secondary Dressing: Woven Gauze Sponge, Non-Sterile 4x4 in (Generic) 1 x Per Day/15 Days Discharge Instructions: Apply over primary dressing as directed. Secured With: [employer], 4.5x3.1 (in/yd) (Generic) 1 x Per Day/15 Days Discharge Instructions: Secure with  Kerlix as directed. Secured With: 755M Medipore H Soft Cloth Surgical T ape, 4 x 10 (in/yd) (Generic) 1 x Per Day/15 Days Discharge Instructions: Secure with tape as directed. WOUND #5: - Lower Leg Wound Laterality: Right, Lateral,  Distal Cleanser: Soap and Water 1 x Per Day/15 Days Discharge Instructions: May shower and wash wound with dial antibacterial soap and water prior to dressing change. Peri-Wound Care: Triamcinolone 15 (g) 1 x Per Day/15 Days Discharge Instructions: Use triamcinolone apply to entire both legs. around the wounds. Prim Dressing: PolyMem Silver Non-Adhesive Dressing, 4.25x4.25 in 1 x Per Day/15 Days ary Discharge Instructions: Apply to wound bed as instructed Prim Dressing: Santyl Ointment 1 x Per Day/15 Days ary Discharge Instructions: Apply nickel thick amount to wound bed as instructed Secondary Dressing: Woven Gauze Sponge, Non-Sterile 4x4 in (Generic) 1 x Per Day/15 Days Discharge Instructions: Apply over primary dressing as directed. Secured With: [employer], 4.5x3.1 (in/yd) (Generic) 1 x Per Day/15 Days Discharge Instructions: Secure with Kerlix as directed. Secured With: 64M Medipore H Soft Cloth Surgical T ape, 4 x 10 (in/yd) (Generic) 1 x Per Day/15 Days Discharge Instructions: Secure with tape as directed. WOUND #6: - Lower Leg Wound Laterality: Left, Posterior, Proximal Cleanser: Soap and Water 1 x Per Day/15 Days Discharge Instructions: May shower and wash wound with dial antibacterial soap and water prior to dressing change. Peri-Wound Care: Triamcinolone 15 (g) 1 x Per Day/15 Days Discharge Instructions: Use triamcinolone apply to entire both legs. around the wounds. Prim Dressing: PolyMem Silver Non-Adhesive Dressing, 4.25x4.25 in 1 x Per Day/15 Days ary Discharge Instructions: Apply to wound bed as instructed Prim Dressing: Santyl Ointment 1 x Per Day/15 Days ary Discharge Instructions: Apply nickel thick amount to wound bed as  instructed Secondary Dressing: Woven Gauze Sponge, Non-Sterile 4x4 in (Generic) 1 x Per Day/15 Days Discharge Instructions: Apply over primary dressing as directed. Secured With: [employer], 4.5x3.1 (in/yd) (Generic) 1 x Per Day/15 Days Discharge Instructions: Secure with Kerlix as directed. Secured With: 64M Medipore H Soft Cloth Surgical T ape, 4 x 10 (in/yd) (Generic) 1 x Per Day/15 Days Discharge Instructions: Secure with tape as directed. [name] [name], [name] [name] ([id number]) [id number] Page 12 of 13 1. In office sharp debridement 2. PolyMem silver 3. Santyl 4. Velcro compression wraps daily 5. Follow-up in 1 week Electronic Signature(s) Signed: 04/09/2023 2:04:51 PM By: Geralyn Corwin DO Entered By: Geralyn Corwin on 04/09/2023 11:21:50 -------------------------------------------------------------------------------- HxROS Details Patient Name: Date of Service: [name] [name] [name], [name] 04/09/2023 8:45 A M Medical Record Number: [id number] Patient Account Number: [id number] Date of Birth/Sex: Treating RN: 21-Jun-1956 (67 y.o. M) Primary Care Provider: Cranford Mon Other Clinician: Referring Provider: Treating Provider/Extender: Shaune Pollack in Treatment: 15 Hematologic/Lymphatic Medical History: Positive for: Anemia; Lymphedema Cardiovascular Medical History: Positive for: Hypertension Gastrointestinal Medical History: Positive for: Cirrhosis - crpytogenic Past Medical History Notes: GERD Endocrine Medical History: Positive for: Type II Diabetes - 6.2 HgbA1c Time with diabetes: 20 yeatrs Treated with: Insulin Genitourinary Medical History: Past Medical History Notes: stage III CKD Immunizations Pneumococcal Vaccine: Received Pneumococcal Vaccination: No Implantable Devices No devices added Family and Social History Diabetes: Yes - Father; Heart Disease: Yes - Father; Current every day smoker; Marital Status - Single;  Alcohol Use: Never; Drug Use: No History; Caffeine Use: Never; Financial Concerns: No; Food, Clothing or Shelter Needs: No; Support System Lacking: No; Transportation Concerns: No Electronic Signature(s) Signed: 04/09/2023 2:04:51 PM By: Geralyn Corwin DO Entered By: Geralyn Corwin on 04/09/2023 11:16:10 [name] [name] ([id number]) [id number] Page 13 of 13 -------------------------------------------------------------------------------- SuperBill Details Patient Name: Date of Service: [name] [name] 04/09/2023 Medical Record Number: [id number] Patient Account Number: [id number] Date of Birth/Sex: Treating  RN: Aug 18, 1955 (67 y.o. [name] Primary Care Provider: Cranford Mon Other Clinician: Referring Provider: Treating Provider/Extender: Shaune Pollack in Treatment: 15 Diagnosis Coding ICD-10 Codes Code Description [phone] Chronic venous hypertension (idiopathic) with ulcer of bilateral lower extremity L97.818 Non-pressure chronic ulcer of other part of right lower leg with other specified severity L97.828 Non-pressure chronic ulcer of other part of left lower leg with other specified severity L95.0 Livedoid vasculitis I89.0 Lymphedema, not elsewhere classified E11.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 other skin ulcer K74.69 Other cirrhosis of liver Facility Procedures : CPT4 Code: [id number] Description: 97597 - DEBRIDE WOUND 1ST 20 SQ CM OR < ICD-10 Diagnosis Description I87.313 Chronic venous hypertension (idiopathic) with ulcer of bilateral lower extremity L97.818 Non-pressure chronic ulcer of other part of right lower leg with other  specified s L97.828 Non-pressure chronic ulcer of other part of left lower leg with other specified se E11.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 other skin ulcer Modifier: everity verity Quantity: 1 Physician Procedures : CPT4 Code Description Modifier [id number] - WC PHYS DEBR WO ANESTH 20  SQ CM ICD-10 Diagnosis Description I87.313 Chronic venous hypertension (idiopathic) with ulcer of bilateral lower extremity L97.818 Non-pressure chronic ulcer of other part of  right lower leg with other specified severity L97.828 Non-pressure chronic ulcer of other part of left lower leg with other specified severity E11.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 other skin ulcer Quantity: 1 Electronic Signature(s) Signed: 04/09/2023 2:04:51 PM By: Geralyn Corwin DO Entered By: Geralyn Corwin on 04/09/2023 11:23:06

## 2023-04-17 NOTE — Progress Notes (Signed)
DEREX, GARFIAS ([id number]) [id number] Page 1 of 13 Visit Report for 04/09/2023 Arrival Information Details Patient Name: Date of Service: [name] [name]. 04/09/2023 8:45 A M Medical Record Number: [id number] Patient Account Number: [id number] Date of Birth/Sex: Treating RN: 1955-11-21 (67 y.o. [name] Primary Care Finnlee Guarnieri: [place] Mon Other Clinician: Referring Pawan Knechtel: Treating Chalmers Iddings/Extender: Shaune Pollack in Treatment: 15 Visit Information History Since Last Visit Added or deleted any medications: No Patient Arrived: Ambulatory Any new allergies or adverse reactions: No Arrival Time: 08:38 Had a fall or experienced change in No Accompanied By: wife activities of daily living that may affect Transfer Assistance: None risk of falls: Patient Identification Verified: Yes Signs or symptoms of abuse/neglect since last visito No Secondary Verification Process Completed: Yes Hospitalized since last visit: No Patient Requires Transmission-Based No Implantable device outside of the clinic excluding No Precautions: cellular tissue based products placed in the center Patient Has Alerts: Yes since last visit: Patient Alerts: 10/23 ABI L0.94 R1 VVS Has Dressing in Place as Prescribed: Yes 10/23 TBI L1.2 R0.88 VVS Has Compression in Place as Prescribed: Yes Pain Present Now: No Electronic Signature(s) Signed: 04/09/2023 5:35:33 PM By: Redmond Pulling RN, BSN Entered By: Redmond Pulling on 04/09/2023 08:38:57 -------------------------------------------------------------------------------- Encounter Discharge Information Details Patient Name: Date of Service: [name] [name] RRIS M. 04/09/2023 8:45 A M Medical Record Number: [id number] Patient Account Number: [id number] Date of Birth/Sex: Treating RN: 1955/09/05 (67 y.o. [name] Primary Care Markeeta Scalf: [place] Mon Other Clinician: Referring Jamal Pavon: Treating  Satoria Dunlop/Extender: Shaune Pollack in Treatment: 15 Encounter Discharge Information Items Post Procedure Vitals Discharge Condition: Stable Temperature (F): 98.3 Ambulatory Status: Ambulatory Pulse (bpm): 78 Discharge Destination: Home Respiratory Rate (breaths/min): 18 Transportation: Private Auto Blood Pressure (mmHg): 139/65 Accompanied By: spouse Schedule Follow-up Appointment: Yes Clinical Summary of Care: Patient Declined Electronic Signature(s) Signed: 04/17/2023 7:59:39 AM By: Brenton Grills Entered By: Brenton Grills on 04/09/2023 09:48:51 [name] [name] ([id number]) [id number] Page 2 of 13 -------------------------------------------------------------------------------- Lower Extremity Assessment Details Patient Name: Date of Service: [name] Sieving. 04/09/2023 8:45 A M Medical Record Number: [id number] Patient Account Number: [id number] Date of Birth/Sex: Treating RN: February 03, 1956 (67 y.o. [name] Primary Care Kaelin Bonelli: [place] Mon Other Clinician: Referring Waniya Hoglund: Treating Cecely Rengel/Extender: Shaune Pollack in Treatment: 15 Edema Assessment Assessed: [name]: No] [name]: No] Edema: [Left: No] [Right: No] Calf Left: Right: Point of Measurement: 37 cm From Medial Instep 33 cm 33.2 cm Ankle Left: Right: Point of Measurement: 13 cm From Medial Instep 22 cm 22 cm Vascular Assessment Pulses: Dorsalis Pedis Palpable: [Left:Yes] [Right:Yes] Extremity colors, hair growth, and conditions: Extremity Color: [Left:Hyperpigmented] [Right:Hyperpigmented] Hair Growth on Extremity: [Left:No] [Right:No] Temperature of Extremity: [Left:Warm] [Right:Warm] Capillary Refill: [Left:< 3 seconds] [Right:< 3 seconds] Dependent Rubor: [Left:No No] [Right:No Yes] Electronic Signature(s) Signed: 04/09/2023 5:35:33 PM By: Redmond Pulling RN, BSN Entered By: Redmond Pulling on 04/09/2023  08:46:36 -------------------------------------------------------------------------------- Multi Wound Chart Details Patient Name: Date of Service: [name] [name] RRIS M. 04/09/2023 8:45 A M Medical Record Number: [id number] Patient Account Number: [id number] Date of Birth/Sex: Treating RN: 08-09-1955 (67 y.o. M) Primary Care Aldean Pipe: [place] Mon Other Clinician: Referring Marsalis Beaulieu: Treating Kodee Drury/Extender: Shaune Pollack in Treatment: 15 Vital Signs Height(in): 72 Pulse(bpm): 49 Weight(lbs): 245 Blood Pressure(mmHg): 141/55 Body Mass Index(BMI): 33.2 Temperature(F): 97.7 Respiratory Rate(breaths/min): 18 [3:Photos:] [[id number] Page 3 of 13] Right, Proximal, Lateral Lower Leg  Left, Distal, Medial Lower Leg Right, Distal, Lateral Lower Leg Wound Location: Gradually Appeared Gradually Appeared Blister Wounding Event: Diabetic Wound/Ulcer of the Lower Diabetic Wound/Ulcer of the Lower Diabetic Wound/Ulcer of the Lower Primary Etiology: Extremity Extremity Extremity N/A Venous Leg Ulcer N/A Secondary Etiology: Anemia, Lymphedema, Hypertension, Anemia, Lymphedema, Hypertension, Anemia, Lymphedema, Hypertension, Comorbid History: Cirrhosis , Type II Diabetes Cirrhosis , Type II Diabetes Cirrhosis , Type II Diabetes 01/25/2023 02/11/2023 02/19/2023 Date Acquired: 9 8 7  Weeks of Treatment: Open Open Open Wound Status: No No No Wound Recurrence: No No Yes Clustered Wound: 3 N/A 1 Clustered Quantity: 0.4x0.3x0.1 0.1x0.1x0.1 0.4x0.5x0.1 Measurements L x W x D (cm) 0.094 0.008 0.157 A (cm) : rea 0.009 0.001 0.016 Volume (cm) : -32.40% 97.90% 75.30% % Reduction in A rea: 35.70% 97.40% 75.00% % Reduction in Volume: Grade 1 Grade 1 Grade 1 Classification: Medium Medium Medium Exudate A mount: Serosanguineous Serosanguineous Serosanguineous Exudate Type: red, brown red, brown red, brown Exudate Color: Distinct, outline  attached Distinct, outline attached Distinct, outline attached Wound Margin: None Present (0%) None Present (0%) None Present (0%) Granulation A mount: N/A N/A N/A Granulation Quality: Large (67-100%) None Present (0%) Large (67-100%) Necrotic A mount: Fat Layer (Subcutaneous Tissue): Yes Fascia: No Fat Layer (Subcutaneous Tissue): Yes Exposed Structures: Fascia: No Fat Layer (Subcutaneous Tissue): No Fascia: No Tendon: No Tendon: No Tendon: No Muscle: No Muscle: No Muscle: No Joint: No Joint: No Joint: No Bone: No Bone: No Bone: No Small (1-33%) Large (67-100%) Small (1-33%) Epithelialization: Debridement - Selective/Open Wound Debridement - Selective/Open Wound Debridement - Selective/Open Wound Debridement: Pre-procedure Verification/Time Out 09:33 09:33 09:33 Taken: Lidocaine 4% Topical Solution Lidocaine 4% Topical Solution Lidocaine 4% Topical Solution Pain Control: [employer] Tissue Debrided: Non-Viable Tissue Non-Viable Tissue Non-Viable Tissue Level: 0.09 0.01 0.16 Debridement A (sq cm): rea Curette Curette Curette Instrument: Minimum Minimum Minimum Bleeding: Pressure Pressure Pressure Hemostasis A chieved: 0 0 0 Procedural Pain: 0 0 0 Post Procedural Pain: Procedure was tolerated well Procedure was tolerated well Procedure was tolerated well Debridement Treatment Response: 0.4x0.3x0.1 0.1x0.1x0.1 0.4x0.5x0.1 Post Debridement Measurements L x W x D (cm) 0.009 0.001 0.016 Post Debridement Volume: (cm) Excoriation: No Excoriation: No Excoriation: No Periwound Skin Texture: Induration: No Induration: No Induration: No Callus: No Callus: No Callus: No Crepitus: No Crepitus: No Crepitus: No Rash: No Rash: No Rash: No Scarring: No Scarring: No Scarring: No Maceration: No Maceration: No Maceration: No Periwound Skin Moisture: Dry/Scaly: No Dry/Scaly: No Dry/Scaly: No Atrophie Blanche: No Atrophie Blanche: No Atrophie  Blanche: No Periwound Skin Color: Cyanosis: No Cyanosis: No Cyanosis: No Ecchymosis: No Ecchymosis: No Ecchymosis: No Erythema: No Erythema: No Erythema: No Hemosiderin Staining: No Hemosiderin Staining: No Hemosiderin Staining: No Mottled: No Mottled: No Mottled: No Pallor: No Pallor: No Pallor: No Rubor: No Rubor: No Rubor: No No Abnormality No Abnormality No Abnormality Temperature: Debridement Debridement Debridement Procedures Performed: Wound Number: 6 N/A N/A Photos: N/A N/A [name] [name], [name] [name] ([id number]) [id number] Page 4 of 13 Left, Proximal, Posterior Lower Leg N/A N/A Wound Location: Blister N/A N/A Wounding Event: Diabetic Wound/Ulcer of the Lower N/A N/A Primary Etiology: Extremity N/A N/A N/A Secondary Etiology: Anemia, Lymphedema, Hypertension, N/A N/A Comorbid History: Cirrhosis , Type II Diabetes 02/19/2023 N/A N/A Date Acquired: 7 N/A N/A Weeks of Treatment: Open N/A N/A Wound Status: No N/A N/A Wound Recurrence: No N/A N/A Clustered Wound: N/A N/A N/A Clustered Quantity: 0.6x0.9x0.1 N/A N/A Measurements L x W x D (cm) 0.424 N/A N/A A (  cm) : rea 0.042 N/A N/A Volume (cm) : 74.30% N/A N/A % Reduction in A rea: 74.50% N/A N/A % Reduction in Volume: Grade 1 N/A N/A Classification: Medium N/A N/A Exudate A mount: Serosanguineous N/A N/A Exudate Type: red, brown N/A N/A Exudate Color: Distinct, outline attached N/A N/A Wound Margin: Small (1-33%) N/A N/A Granulation A mount: Pink N/A N/A Granulation Quality: Large (67-100%) N/A N/A Necrotic A mount: Fat Layer (Subcutaneous Tissue): Yes N/A N/A Exposed Structures: Fascia: No Tendon: No Muscle: No Joint: No Bone: No Small (1-33%) N/A N/A Epithelialization: Debridement - Selective/Open Wound N/A N/A Debridement: Pre-procedure Verification/Time Out 09:33 N/A N/A Taken: Lidocaine 4% Topical Solution N/A N/A Pain Control: Slough N/A  N/A Tissue Debrided: Non-Viable Tissue N/A N/A Level: 0.42 N/A N/A Debridement A (sq cm): rea Curette N/A N/A Instrument: Minimum N/A N/A Bleeding: Pressure N/A N/A Hemostasis A chieved: 0 N/A N/A Procedural Pain: 0 N/A N/A Post Procedural Pain: Procedure was tolerated well N/A N/A Debridement Treatment Response: 0.6x0.9x0.1 N/A N/A Post Debridement Measurements L x W x D (cm) 0.042 N/A N/A Post Debridement Volume: (cm) Excoriation: No N/A N/A Periwound Skin Texture: Induration: No Callus: No Crepitus: No Rash: No Scarring: No Maceration: No N/A N/A Periwound Skin Moisture: Dry/Scaly: No Atrophie Blanche: No N/A N/A Periwound Skin Color: Cyanosis: No Ecchymosis: No Erythema: No Hemosiderin Staining: No Mottled: No Pallor: No Rubor: No No Abnormality N/A N/A Temperature: Debridement N/A N/A Procedures Performed: Treatment Notes Wound #3 (Lower Leg) Wound Laterality: Right, Lateral, Proximal Cleanser Soap and Water Discharge Instruction: May shower and wash wound with dial antibacterial soap and water prior to dressing change. Peri-Wound Care Triamcinolone 15 (g) Discharge Instruction: Use triamcinolone apply to entire both legs. around the wounds. Topical Primary Dressing PolyMem Silver Non-Adhesive Dressing, 4.25x4.25 in [name] [name], [name] [name] ([id number]) [id number].pdf Page 5 of 13 Discharge Instruction: Apply to wound bed as instructed Santyl Ointment Discharge Instruction: Apply nickel thick amount to wound bed as instructed Secondary Dressing Woven Gauze Sponge, Non-Sterile 4x4 in Discharge Instruction: Apply over primary dressing as directed. Secured With [employer], 4.5x3.1 (in/yd) Discharge Instruction: Secure with Kerlix as directed. 52M Medipore H Soft Cloth Surgical T ape, 4 x 10 (in/yd) Discharge Instruction: Secure with tape as directed. Compression Wrap Compression Stockings Add-Ons Wound #4 (Lower Leg)  Wound Laterality: Left, Medial, Distal Cleanser Soap and Water Discharge Instruction: May shower and wash wound with dial antibacterial soap and water prior to dressing change. Peri-Wound Care Triamcinolone 15 (g) Discharge Instruction: Use triamcinolone apply to entire both legs. around the wounds. Topical Primary Dressing PolyMem Silver Non-Adhesive Dressing, 4.25x4.25 in Discharge Instruction: Apply to wound bed as instructed Santyl Ointment Discharge Instruction: Apply nickel thick amount to wound bed as instructed Secondary Dressing Woven Gauze Sponge, Non-Sterile 4x4 in Discharge Instruction: Apply over primary dressing as directed. Secured With [employer], 4.5x3.1 (in/yd) Discharge Instruction: Secure with Kerlix as directed. 52M Medipore H Soft Cloth Surgical T ape, 4 x 10 (in/yd) Discharge Instruction: Secure with tape as directed. Compression Wrap Compression Stockings Add-Ons Wound #5 (Lower Leg) Wound Laterality: Right, Lateral, Distal Cleanser Soap and Water Discharge Instruction: May shower and wash wound with dial antibacterial soap and water prior to dressing change. Peri-Wound Care Triamcinolone 15 (g) Discharge Instruction: Use triamcinolone apply to entire both legs. around the wounds. Topical Primary Dressing PolyMem Silver Non-Adhesive Dressing, 4.25x4.25 in Discharge Instruction: Apply to wound bed as instructed Santyl Ointment Discharge Instruction: Apply nickel thick amount to wound bed as instructed  [address] ABDULLATIF, TURRENTINE ([id number]) [id number] Page 6 of 13 Woven Gauze Sponge, Non-Sterile 4x4 in Discharge Instruction: Apply over primary dressing as directed. Secured With [employer], 4.5x3.1 (in/yd) Discharge Instruction: Secure with Kerlix as directed. 29M Medipore H Soft Cloth Surgical T ape, 4 x 10 (in/yd) Discharge Instruction: Secure with tape as directed. Compression Wrap Compression  Stockings Add-Ons Wound #6 (Lower Leg) Wound Laterality: Left, Posterior, Proximal Cleanser Soap and Water Discharge Instruction: May shower and wash wound with dial antibacterial soap and water prior to dressing change. Peri-Wound Care Triamcinolone 15 (g) Discharge Instruction: Use triamcinolone apply to entire both legs. around the wounds. Topical Primary Dressing PolyMem Silver Non-Adhesive Dressing, 4.25x4.25 in Discharge Instruction: Apply to wound bed as instructed Santyl Ointment Discharge Instruction: Apply nickel thick amount to wound bed as instructed Secondary Dressing Woven Gauze Sponge, Non-Sterile 4x4 in Discharge Instruction: Apply over primary dressing as directed. Secured With [employer], 4.5x3.1 (in/yd) Discharge Instruction: Secure with Kerlix as directed. 29M Medipore H Soft Cloth Surgical T ape, 4 x 10 (in/yd) Discharge Instruction: Secure with tape as directed. Compression Wrap Compression Stockings Add-Ons Electronic Signature(s) Signed: 04/09/2023 2:04:51 PM By: Geralyn Corwin DO Entered By: Geralyn Corwin on 04/09/2023 11:05:40 -------------------------------------------------------------------------------- Multi-Disciplinary Care Plan Details Patient Name: Date of Service: [name] Dalbert Garnet, [name] RRIS M. 04/09/2023 8:45 A M Medical Record Number: [id number] Patient Account Number: [id number] Date of Birth/Sex: Treating RN: 05-19-1956 (67 y.o. [name] Primary Care Phillippa Straub: [place] Mon Other Clinician: Referring Kamyiah Colantonio: Treating Tierrah Anastos/Extender: Shaune Pollack in Treatment: [address] [place], [place] ([id number]) [id number] Page 7 of 13 Pain, Acute or Chronic Nursing Diagnoses: Pain, acute or chronic: actual or potential Potential alteration in comfort, pain Goals: Patient will verbalize adequate pain control and receive pain control interventions during procedures as  needed Date Initiated: 12/24/2022 Target Resolution Date: 05/08/2023 Goal Status: Active Patient/caregiver will verbalize comfort level met Date Initiated: 12/24/2022 Date Inactivated: 01/31/2023 Target Resolution Date: 02/14/2023 Goal Status: Met Interventions: Encourage patient to take pain medications as prescribed Provide education on pain management Treatment Activities: Administer pain control measures as ordered : 12/24/2022 Notes: Electronic Signature(s) Signed: 04/09/2023 5:35:33 PM By: Redmond Pulling RN, BSN Entered By: Redmond Pulling on 04/09/2023 09:09:23 -------------------------------------------------------------------------------- Pain Assessment Details Patient Name: Date of Service: [name] [name] RRIS M. 04/09/2023 8:45 A M Medical Record Number: [id number] Patient Account Number: [id number] Date of Birth/Sex: Treating RN: [birth year] (67 y.o. [name] Primary Care Drake Wuertz: [place] Mon Other Clinician: Referring Kalub Morillo: Treating Ronen Bromwell/Extender: Shaune Pollack in Treatment: 15 Active Problems Location of Pain Severity and Description of Pain Patient Has Paino No Site Locations Pain Management and Medication Current Pain Management: Electronic Signature(s) Signed: 04/09/2023 5:35:33 PM By: Redmond Pulling RN, BSN Entered By: Redmond Pulling on 04/09/2023 08:41:26 [name] [name] ([id number]) [id number] Page 8 of 13 -------------------------------------------------------------------------------- Patient/Caregiver Education Details Patient Name: Date of Service: [name] [name]. 9/3/2024andnbsp8:45 A M Medical Record Number: [id number] Patient Account Number: [id number] Date of Birth/Gender: Treating RN: [birth year] (67 y.o. [name] Primary Care Physician: [place] Mon Other Clinician: Referring Physician: Treating Physician/Extender: Shaune Pollack in Treatment: 15 Education  Assessment Education Provided To: Patient and Caregiver Education Topics Provided Wound/Skin Impairment: Methods: Explain/Verbal Responses: State content correctly [name]) Signed: 04/09/2023 5:35:33 PM By: Redmond Pulling RN, BSN Entered By: Redmond Pulling on 04/09/2023 09:09:41 -------------------------------------------------------------------------------- Wound Assessment Details Patient Name: Date of  Service: [name] [name] 04/09/2023 8:45 A M Medical Record Number: [id number] Patient Account Number: [id number] Date of Birth/Sex: Treating RN: 10-Aug-1955 (67 y.o. [name] Primary Care Windell Musson: [place] Mon Other Clinician: Referring Samira Acero: Treating Valley Ke/Extender: Shaune Pollack in Treatment: 15 Wound Status Wound Number: 3 Primary Diabetic Wound/Ulcer of the Lower Extremity Etiology: Wound Location: Right, Proximal, Lateral Lower Leg Wound Status: Open Wounding Event: Gradually Appeared Comorbid Anemia, Lymphedema, Hypertension, Cirrhosis , Type II Date Acquired: 01/25/2023 History: Diabetes Weeks Of Treatment: 9 Clustered Wound: No Photos Wound Measurements [name] [name], [name] [name] ([id number]) Length: (cm) 0.4 Width: (cm) 0.3 Depth: (cm) 0.1 Clustered Quantity: 3 Area: (cm) 0.094 Volume: (cm) 0.009 [id number] Page 9 of 13 % Reduction in Area: -32.4% % Reduction in Volume: 35.7% Epithelialization: Small (1-33%) Tunneling: No Undermining: No Wound Description Classification: Grade 1 Wound Margin: Distinct, outline attached Exudate Amount: Medium Exudate Type: Serosanguineous Exudate Color: red, brown Foul Odor After Cleansing: No Slough/Fibrino Yes Wound Bed Granulation Amount: None Present (0%) Exposed Structure Necrotic Amount: Large (67-100%) Fascia Exposed: No Necrotic Quality: Adherent Slough Fat Layer (Subcutaneous Tissue) Exposed: Yes Tendon Exposed: No Muscle Exposed:  No Joint Exposed: No Bone Exposed: No Periwound Skin Texture Texture Color No Abnormalities Noted: No No Abnormalities Noted: No Callus: No Atrophie Blanche: No Crepitus: No Cyanosis: No Excoriation: No Ecchymosis: No Induration: No Erythema: No Rash: No Hemosiderin Staining: No Scarring: No Mottled: No Pallor: No Moisture Rubor: No No Abnormalities Noted: No Dry / Scaly: No Temperature / Pain Maceration: No Temperature: No Abnormality Electronic Signature(s) Signed: 04/09/2023 5:35:33 PM By: Redmond Pulling RN, BSN Entered By: Redmond Pulling on 04/09/2023 09:04:02 -------------------------------------------------------------------------------- Wound Assessment Details Patient Name: Date of Service: [name] [name] RRIS M. 04/09/2023 8:45 A M Medical Record Number: [id number] Patient Account Number: [id number] Date of Birth/Sex: Treating RN: 02/16/1956 (67 y.o. [name] Primary Care Parrish Daddario: [place] Mon Other Clinician: Referring Lourdez Mcgahan: Treating Shivangi Lutz/Extender: Lanney Gins Weeks in Treatment: 15 Wound Status Wound Number: 4 Primary Etiology: Diabetic Wound/Ulcer of the Lower Extremity Wound Location: Left, Distal, Medial Lower Leg Secondary Venous Leg Ulcer Etiology: Wounding Event: Gradually Appeared Wound Status: Open Date Acquired: 02/11/2023 Comorbid Anemia, Lymphedema, Hypertension, Cirrhosis , Type II Weeks Of Treatment: 8 History: Diabetes Clustered Wound: No Photos [name] [name], [name] [name] ([id number]) [id number] Page 10 of 13 Wound Measurements Length: (cm) 0.1 Width: (cm) 0.1 Depth: (cm) 0.1 Area: (cm) 0.008 Volume: (cm) 0.001 % Reduction in Area: 97.9% % Reduction in Volume: 97.4% Epithelialization: Large (67-100%) Tunneling: No Undermining: No Wound Description Classification: Grade 1 Wound Margin: Distinct, outline attached Exudate Amount: Medium Exudate Type: Serosanguineous Exudate Color:  red, brown Foul Odor After Cleansing: No Slough/Fibrino Yes Wound Bed Granulation Amount: None Present (0%) Exposed Structure Necrotic Amount: None Present (0%) Fascia Exposed: No Fat Layer (Subcutaneous Tissue) Exposed: No Tendon Exposed: No Muscle Exposed: No Joint Exposed: No Bone Exposed: No Periwound Skin Texture Texture Color No Abnormalities Noted: No No Abnormalities Noted: No Callus: No Atrophie Blanche: No Crepitus: No Cyanosis: No Excoriation: No Ecchymosis: No Induration: No Erythema: No Rash: No Hemosiderin Staining: No Scarring: No Mottled: No Pallor: No Moisture Rubor: No No Abnormalities Noted: No Dry / Scaly: No Temperature / Pain Maceration: No Temperature: No Abnormality Electronic Signature(s) Signed: 04/09/2023 5:35:33 PM By: Redmond Pulling RN, BSN Signed: 04/17/2023 7:59:39 AM By: Brenton Grills Entered By: Brenton Grills on 04/09/2023 09:05:54 -------------------------------------------------------------------------------- Wound Assessment Details Patient Name: Date of Service: [name]  [name] RRIS M. 04/09/2023 8:45 A M Medical Record Number: [id number] Patient Account Number: [id number] Date of Birth/Sex: Treating RN: 06/16/1956 (67 y.o. [name] Primary Care Sahiti Joswick: [place] Mon Other Clinician: Referring Dewitte Vannice: Treating Sherena Machorro/Extender: Shaune Pollack in Treatment: [address] [name] [name], [name] [name] ([id number]) [id number] Page 11 of 13 Wound Number: 5 Primary Diabetic Wound/Ulcer of the Lower Extremity Etiology: Wound Location: Right, Distal, Lateral Lower Leg Wound Status: Open Wounding Event: Blister Comorbid Anemia, Lymphedema, Hypertension, Cirrhosis , Type II Date Acquired: 02/19/2023 History: Diabetes Weeks Of Treatment: 7 Clustered Wound: Yes Photos Wound Measurements Length: (cm) Width: (cm) Depth: (cm) Clustered Quantity: Area: (cm) Volume: (cm) 0.4 % Reduction  in Area: 75.3% 0.5 % Reduction in Volume: 75% 0.1 Epithelialization: Small (1-33%) 1 Tunneling: No 0.157 Undermining: No 0.016 Wound Description Classification: Grade 1 Wound Margin: Distinct, outline attached Exudate Amount: Medium Exudate Type: Serosanguineous Exudate Color: red, brown Foul Odor After Cleansing: No Slough/Fibrino Yes Wound Bed Granulation Amount: None Present (0%) Exposed Structure Necrotic Amount: Large (67-100%) Fascia Exposed: No Necrotic Quality: Adherent Slough Fat Layer (Subcutaneous Tissue) Exposed: Yes Tendon Exposed: No Muscle Exposed: No Joint Exposed: No Bone Exposed: No Periwound Skin Texture Texture Color No Abnormalities Noted: No No Abnormalities Noted: No Callus: No Atrophie Blanche: No Crepitus: No Cyanosis: No Excoriation: No Ecchymosis: No Induration: No Erythema: No Rash: No Hemosiderin Staining: No Scarring: No Mottled: No Pallor: No Moisture Rubor: No No Abnormalities Noted: No Dry / Scaly: No Temperature / Pain Maceration: No Temperature: No Abnormality Electronic Signature(s) Signed: 04/09/2023 5:35:33 PM By: Redmond Pulling RN, BSN Signed: 04/17/2023 7:59:39 AM By: Brenton Grills Entered By: Brenton Grills on 04/09/2023 09:06:42 Wound Assessment Details -------------------------------------------------------------------------------- [name] [name] ([id number]) [id number] Page 12 of 13 Patient Name: Date of Service: [name] [name]. 04/09/2023 8:45 A M Medical Record Number: [id number] Patient Account Number: [id number] Date of Birth/Sex: Treating RN: Nov 20, 1955 (67 y.o. [name] Primary Care Taji Barretto: [place] Mon Other Clinician: Referring Flavia Bruss: Treating Saryah Loper/Extender: Shaune Pollack in Treatment: 15 Wound Status Wound Number: 6 Primary Diabetic Wound/Ulcer of the Lower Extremity Etiology: Wound Location: Left, Proximal, Posterior Lower  Leg Wound Status: Open Wounding Event: Blister Comorbid Anemia, Lymphedema, Hypertension, Cirrhosis , Type II Date Acquired: 02/19/2023 History: Diabetes Weeks Of Treatment: 7 Clustered Wound: No Photos Wound Measurements Length: (cm) 0.6 % Reduction in Area: 74.3% Width: (cm) 0.9 % Reduction in Volume: 74.5% Depth: (cm) 0.1 Epithelialization: Small (1-33%) Area: (cm) 0.424 Tunneling: No Volume: (cm) 0.042 Undermining: No Wound Description Classification: Grade 1 Foul Odor After Cleansing: No Wound Margin: Distinct, outline attached Slough/Fibrino Yes Exudate Amount: Medium Exudate Type: Serosanguineous Exudate Color: red, brown Wound Bed Granulation Amount: Small (1-33%) Exposed Structure Granulation Quality: Pink Fascia Exposed: No Necrotic Amount: Large (67-100%) Fat Layer (Subcutaneous Tissue) Exposed: Yes Necrotic Quality: Adherent Slough Tendon Exposed: No Muscle Exposed: No Joint Exposed: No Bone Exposed: No Periwound Skin Texture Texture Color No Abnormalities Noted: No No Abnormalities Noted: No Callus: No Atrophie Blanche: No Crepitus: No Cyanosis: No Excoriation: No Ecchymosis: No Induration: No Erythema: No Rash: No Hemosiderin Staining: No Scarring: No Mottled: No Pallor: No Moisture Rubor: No No Abnormalities Noted: No Dry / Scaly: No Temperature / Pain Maceration: No Temperature: No Abnormality Electronic Signature(s) Signed: 04/09/2023 5:35:33 PM By: Redmond Pulling RN, BSN Signed: 04/17/2023 7:59:39 AM By: Brenton Grills Entered By: Brenton Grills on 04/09/2023 09:07:20 [name] [name] ([id number]) [id number] Page [phone]  of 13 -------------------------------------------------------------------------------- Vitals Details Patient Name: Date of Service: [name] [name]. 04/09/2023 8:45 A M Medical Record Number: [id number] Patient Account Number: [id number] Date of Birth/Sex: Treating RN: [birth year] (67 y.o. [name] Primary Care Galo Sayed: [place] Mon Other Clinician: Referring Sharis Keeran: Treating Jawanda Passey/Extender: Shaune Pollack in Treatment: 15 Vital Signs Time Taken: 08:39 Temperature (F): 97.7 Height (in): 72 Pulse (bpm): 49 Weight (lbs): 245 Respiratory Rate (breaths/min): 18 Body Mass Index (BMI): 33.2 Blood Pressure (mmHg): 141/55 Reference Range: 80 - 120 mg / dl Electronic Signature(s) Signed: 04/09/2023 5:35:33 PM By: Redmond Pulling RN, BSN Entered By: Redmond Pulling on 04/09/2023 08:41:08

## 2023-04-22 ENCOUNTER — Ambulatory Visit (HOSPITAL_BASED_OUTPATIENT_CLINIC_OR_DEPARTMENT_OTHER): Payer: Medicare Other | Admitting: Internal Medicine

## 2023-04-29 ENCOUNTER — Encounter (HOSPITAL_BASED_OUTPATIENT_CLINIC_OR_DEPARTMENT_OTHER): Payer: Medicare Other | Admitting: Internal Medicine

## 2023-04-29 DIAGNOSIS — E11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 other skin ulcer: Secondary | ICD-10-CM | POA: Diagnosis not present

## 2023-04-29 NOTE — Progress Notes (Signed)
instructed Secondary Dressing: Woven Gauze  Sponge, Non-Sterile 4x4 in (Generic) 1 x Per Day/15 Days Discharge Instructions: Apply over primary dressing as directed. Secured With: [employer], 4.5x3.1 (in/yd) (Generic) 1 x Per Day/15 Days Discharge Instructions: Secure with Kerlix as directed. Secured With: 18M Medipore H Soft Cloth Surgical T ape, 4 x 10 (in/yd) (Generic) 1 x Per Day/15 Days Discharge Instructions: Secure with tape as directed. WOUND #5: - Lower Leg Wound Laterality: Right, Lateral, Distal Cleanser: Soap and Water 1 x Per Day/15 Days Discharge Instructions: May shower and wash wound with dial antibacterial soap and water prior to dressing change. ELIJUAH, YECK ([id number]) [id number] Page 11 of 12 Peri-Wound Care: Triamcinolone 15 (g) 1 x Per Day/15 Days Discharge Instructions: Use triamcinolone apply to entire both legs. around the wounds. Prim Dressing: PolyMem Silver Non-Adhesive Dressing, 4.25x4.25 in 1 x Per Day/15 Days ary Discharge Instructions: Apply to wound bed as instructed Secondary Dressing: Woven Gauze Sponge, Non-Sterile 4x4 in (Generic) 1 x Per Day/15 Days Discharge Instructions: Apply over primary dressing as directed. Secured With: [employer], 4.5x3.1 (in/yd) (Generic) 1 x Per Day/15 Days Discharge Instructions: Secure with Kerlix as directed. Secured With: 18M Medipore H Soft Cloth Surgical T ape, 4 x 10 (in/yd) (Generic) 1 x Per Day/15 Days Discharge Instructions: Secure with tape as directed. WOUND #6: - Lower Leg Wound Laterality: Left, Posterior, Proximal Cleanser: Soap and Water 1 x Per Day/15 Days Discharge Instructions: May shower and wash wound with dial antibacterial soap and water prior to dressing change. Peri-Wound Care: Triamcinolone 15 (g) 1 x Per Day/15 Days Discharge Instructions: Use triamcinolone apply to entire both legs. around the wounds. Prim Dressing: PolyMem Silver Non-Adhesive Dressing, 4.25x4.25 in 1 x Per Day/15  Days ary Discharge Instructions: Apply to wound bed as instructed Secondary Dressing: Woven Gauze Sponge, Non-Sterile 4x4 in (Generic) 1 x Per Day/15 Days Discharge Instructions: Apply over primary dressing as directed. Secured With: [employer], 4.5x3.1 (in/yd) (Generic) 1 x Per Day/15 Days Discharge Instructions: Secure with Kerlix as directed. Secured With: 18M Medipore H Soft Cloth Surgical T ape, 4 x 10 (in/yd) (Generic) 1 x Per Day/15 Days Discharge Instructions: Secure with tape as directed. 1. From what I saw today it looks as though the patient has a separate diagnosis from his chronic venous hypertension. He is developing subcutaneous nodules on his lower extremities that do not seem well-controlled by compression wraps or his stockings. 2. I discontinued the Santyl for now just using polymen under his own compression he can change this every 2 days 3. 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 He does not have any lesions anywhere else. 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 Electronic Signature(s) Signed: 04/29/2023 3:56:56 PM By: [name] MD Entered By: [name] on 04/29/2023 08:05:04 -------------------------------------------------------------------------------- SuperBill Details Patient Name: Date of Service: [name] 04/29/2023 Medical Record Number: [id number] Patient Account Number: [id number] Date of Birth/Sex: Treating RN: Jul 26, 1956 (67 y.o. [name] Primary Care Provider: Cranford Mon Other  Clinician: Referring Provider: Treating Provider/Extender: Leanord Hawking  instructed Secondary Dressing: Woven Gauze  Sponge, Non-Sterile 4x4 in (Generic) 1 x Per Day/15 Days Discharge Instructions: Apply over primary dressing as directed. Secured With: [employer], 4.5x3.1 (in/yd) (Generic) 1 x Per Day/15 Days Discharge Instructions: Secure with Kerlix as directed. Secured With: 18M Medipore H Soft Cloth Surgical T ape, 4 x 10 (in/yd) (Generic) 1 x Per Day/15 Days Discharge Instructions: Secure with tape as directed. WOUND #5: - Lower Leg Wound Laterality: Right, Lateral, Distal Cleanser: Soap and Water 1 x Per Day/15 Days Discharge Instructions: May shower and wash wound with dial antibacterial soap and water prior to dressing change. ELIJUAH, YECK ([id number]) [id number] Page 11 of 12 Peri-Wound Care: Triamcinolone 15 (g) 1 x Per Day/15 Days Discharge Instructions: Use triamcinolone apply to entire both legs. around the wounds. Prim Dressing: PolyMem Silver Non-Adhesive Dressing, 4.25x4.25 in 1 x Per Day/15 Days ary Discharge Instructions: Apply to wound bed as instructed Secondary Dressing: Woven Gauze Sponge, Non-Sterile 4x4 in (Generic) 1 x Per Day/15 Days Discharge Instructions: Apply over primary dressing as directed. Secured With: [employer], 4.5x3.1 (in/yd) (Generic) 1 x Per Day/15 Days Discharge Instructions: Secure with Kerlix as directed. Secured With: 18M Medipore H Soft Cloth Surgical T ape, 4 x 10 (in/yd) (Generic) 1 x Per Day/15 Days Discharge Instructions: Secure with tape as directed. WOUND #6: - Lower Leg Wound Laterality: Left, Posterior, Proximal Cleanser: Soap and Water 1 x Per Day/15 Days Discharge Instructions: May shower and wash wound with dial antibacterial soap and water prior to dressing change. Peri-Wound Care: Triamcinolone 15 (g) 1 x Per Day/15 Days Discharge Instructions: Use triamcinolone apply to entire both legs. around the wounds. Prim Dressing: PolyMem Silver Non-Adhesive Dressing, 4.25x4.25 in 1 x Per Day/15  Days ary Discharge Instructions: Apply to wound bed as instructed Secondary Dressing: Woven Gauze Sponge, Non-Sterile 4x4 in (Generic) 1 x Per Day/15 Days Discharge Instructions: Apply over primary dressing as directed. Secured With: [employer], 4.5x3.1 (in/yd) (Generic) 1 x Per Day/15 Days Discharge Instructions: Secure with Kerlix as directed. Secured With: 18M Medipore H Soft Cloth Surgical T ape, 4 x 10 (in/yd) (Generic) 1 x Per Day/15 Days Discharge Instructions: Secure with tape as directed. 1. From what I saw today it looks as though the patient has a separate diagnosis from his chronic venous hypertension. He is developing subcutaneous nodules on his lower extremities that do not seem well-controlled by compression wraps or his stockings. 2. I discontinued the Santyl for now just using polymen under his own compression he can change this every 2 days 3. 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 He does not have any lesions anywhere else. 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 Electronic Signature(s) Signed: 04/29/2023 3:56:56 PM By: [name] MD Entered By: [name] on 04/29/2023 08:05:04 -------------------------------------------------------------------------------- SuperBill Details Patient Name: Date of Service: [name] 04/29/2023 Medical Record Number: [id number] Patient Account Number: [id number] Date of Birth/Sex: Treating RN: Jul 26, 1956 (67 y.o. [name] Primary Care Provider: Cranford Mon Other  Clinician: Referring Provider: Treating Provider/Extender: Leanord Hawking  Tissue Pre Debridement: Fat layer exposed Level of Consciousness (Pre-procedure): Awake and Alert Pre-procedure Verification/Time Out Yes - 09:15 Taken: Start Time: 09:16 Pain Control: Lidocaine 4% T opical Solution Percent of Wound Bed Debrided: 100% T Area Debrided (cm): otal 0.35 Tissue and other material debrided: Viable, Non-Viable, Slough, Subcutaneous, Skin: Dermis , Skin: Epidermis, Slough Level: Skin/Subcutaneous Tissue Debridement Description: Excisional Instrument: Curette Bleeding: Moderate TRUEMAN, HOLDMAN ([id number]) [id number] Page 3 of 12 Hemostasis Achieved: Pressure End Time: 09:18 Procedural Pain: 0 Post Procedural Pain: 0 Response to Treatment: Procedure was tolerated well Level of Consciousness (Post- Awake and Alert procedure): Post Debridement Measurements of Total Wound Length: (cm) 0.9 Width: (cm) 0.5 Depth: (cm) 0.1 Volume: (cm) 0.035 Character of Wound/Ulcer Post Debridement: Requires Further Debridement Severity of Tissue Post Debridement: Fat layer exposed Post Procedure Diagnosis Same as Pre-procedure Electronic Signature(s) Signed: 04/29/2023 3:56:56 PM By: [name]  MD Entered By: [name] on 04/29/2023 06:27:14 -------------------------------------------------------------------------------- Debridement Details Patient Name: Date of Service: [name] 04/29/2023 9:00 A M Medical Record Number: [id number] Patient Account Number: [id number] Date of Birth/Sex: Treating RN: 1956-07-08 (67 y.o. M) Primary Care Provider: Cranford Mon Other Clinician: Referring Provider: Treating Provider/Extender: [name] in Treatment: 18 Debridement Performed for Assessment: Wound #6 Left,Proximal,Posterior Lower Leg Performed By: Physician Maxwell Caul., MD The following information was scribed by: [name] The information was scribed for: [name] Debridement Type: Debridement Severity of Tissue Pre Debridement: Fat layer exposed Level of Consciousness (Pre-procedure): Awake and Alert Pre-procedure Verification/Time Out Yes - 09:15 Taken: Start Time: 09:16 Pain Control: Lidocaine 4% T opical Solution Percent of Wound Bed Debrided: 100% T Area Debrided (cm): otal 0.2 Tissue and other material debrided: Viable, Non-Viable, Slough, Subcutaneous, Skin: Dermis , Skin: Epidermis, Slough Level: Skin/Subcutaneous Tissue Debridement Description: Excisional Instrument: Curette Bleeding: Moderate Hemostasis Achieved: Pressure End Time: 09:18 Procedural Pain: 0 Post Procedural Pain: 0 Response to Treatment: Procedure was tolerated well Level of Consciousness (Post- Awake and Alert procedure): Post Debridement Measurements of Total Wound Length: (cm) 0.5 Width: (cm) 0.5 Depth: (cm) 0.1 Volume: (cm) 0.02 Character of Wound/Ulcer Post Debridement: Requires Further Debridement Severity of Tissue Post Debridement: Fat layer exposed Post Procedure Diagnosis OTHAR, NEVILLS ([id number]) [id number] Page 4 of 12 Same as Pre-procedure Electronic Signature(s) Signed: 04/29/2023  3:56:56 PM By: [name] MD Entered By: [name] on 04/29/2023 01:02:72 -------------------------------------------------------------------------------- HPI Details Patient Name: Date of Service: [name], [name] 04/29/2023 9:00 A M Medical Record Number: [id number] Patient Account Number: [id number] Date of Birth/Sex: Treating RN: 03-23-1956 (67 y.o. M) Primary Care Provider: Cranford Mon Other Clinician: Referring Provider: Treating Provider/Extender: [name] in Treatment: 18 History of Present Illness HPI Description: 12/24/2022 Mr. Lathen Cucco is a 67 year old male with a past medical history of controlled type 2 diabetes on insulin, lymphedema/chronic venous insufficiency and cirrhosis that presents to the clinic for a 1 year history of nonhealing wounds to his lower extremities bilaterally. He reports the starting spontaneously. He states that he has been following with [facility] and they have been using [facility] antibiotic spray to the legs. He is not wearing compression stockings or wraps. He is also tried Medihoney and collagen in the past with little benefit. He currently denies systemic signs of infection. 5/30; patient presents for follow-up. He has been using triamcinolone cream to the periwound and Santyl to the wound beds. There has been improvement in  in (Generic) 1 x Per Day/15 Days Discharge Instructions: Apply over primary dressing as directed. Secured With: [employer], 4.5x3.1 (in/yd) (Generic) 1 x Per Day/15 Days Discharge Instructions: Secure with Kerlix as directed. Secured With: 38M Medipore H Soft Cloth Surgical T ape, 4 x 10 (in/yd) (Generic) 1 x Per Day/15 Days Discharge Instructions: Secure with tape as directed. Electronic Signature(s) Signed: 04/29/2023 3:56:56 PM By: [name]  MD Signed: 04/29/2023 4:21:40 PM By: [name] RN, BSN Entered By: [name] on 04/29/2023 06:23:13 -------------------------------------------------------------------------------- Problem List Details Patient Name: Date of Service: [name], [name] 04/29/2023 9:00 A M Medical Record Number: [id number] Patient Account Number: [id number] Date of Birth/Sex: Treating RN: 1955-10-24 (67 y.o. 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 04/29/2023 9:00 A M Medical Record Number: [id number] Patient Account Number: [id number] Date of Birth/Sex: Treating RN: 06/26/1956 (67 y.o. M) Primary Care Provider: Cranford Mon Other Clinician: Referring Provider: Treating Provider/Extender: [name] in Treatment:  18 Subjective History of Present Illness (HPI) 12/24/2022 Mr. Jaquinton Gery is a 67 year old male with a past medical history of controlled type 2 diabetes on insulin, lymphedema/chronic venous insufficiency and cirrhosis that presents to the clinic for a 1 year history of nonhealing wounds to his lower extremities bilaterally. He reports the starting spontaneously. He states that he has been following with [facility] and they have been using [facility] antibiotic spray to the legs. He is not wearing compression stockings or wraps. He is also tried Medihoney and collagen in the past with little benefit. He currently denies systemic signs of infection. 5/30; patient presents for follow-up. He has been using triamcinolone cream to the periwound and Santyl to the wound beds. There has been improvement in wound healing. He denies signs of infection and has no issues or complaints today. 6/4; both legs look considerably better. He has a wound on the right lateral lower leg and the left medial lower leg. Significant hemosiderin in the right leg less so on the left. We have been using Hydrofera Blue under compression 6/13; patient presents for follow-up. We have been using antibiotic ointment with Hydrofera Blue under compression therapy. The wounds are smaller. He has no issues or complaints today. We discussed ordering juxta lite compression garment wraps T use once his wounds heal as he has hard time putting on o compression stockings. He was agreeable with this. 6/20; patient presents for follow-up. We have been using antibiotic ointment with Hydrofera Blue under compression therapy to the lower extremities bilaterally. Wounds are smaller. He has been approved for  Tissue Pre Debridement: Fat layer exposed Level of Consciousness (Pre-procedure): Awake and Alert Pre-procedure Verification/Time Out Yes - 09:15 Taken: Start Time: 09:16 Pain Control: Lidocaine 4% T opical Solution Percent of Wound Bed Debrided: 100% T Area Debrided (cm): otal 0.35 Tissue and other material debrided: Viable, Non-Viable, Slough, Subcutaneous, Skin: Dermis , Skin: Epidermis, Slough Level: Skin/Subcutaneous Tissue Debridement Description: Excisional Instrument: Curette Bleeding: Moderate TRUEMAN, HOLDMAN ([id number]) [id number] Page 3 of 12 Hemostasis Achieved: Pressure End Time: 09:18 Procedural Pain: 0 Post Procedural Pain: 0 Response to Treatment: Procedure was tolerated well Level of Consciousness (Post- Awake and Alert procedure): Post Debridement Measurements of Total Wound Length: (cm) 0.9 Width: (cm) 0.5 Depth: (cm) 0.1 Volume: (cm) 0.035 Character of Wound/Ulcer Post Debridement: Requires Further Debridement Severity of Tissue Post Debridement: Fat layer exposed Post Procedure Diagnosis Same as Pre-procedure Electronic Signature(s) Signed: 04/29/2023 3:56:56 PM By: [name]  MD Entered By: [name] on 04/29/2023 06:27:14 -------------------------------------------------------------------------------- Debridement Details Patient Name: Date of Service: [name] 04/29/2023 9:00 A M Medical Record Number: [id number] Patient Account Number: [id number] Date of Birth/Sex: Treating RN: 1956-07-08 (67 y.o. M) Primary Care Provider: Cranford Mon Other Clinician: Referring Provider: Treating Provider/Extender: [name] in Treatment: 18 Debridement Performed for Assessment: Wound #6 Left,Proximal,Posterior Lower Leg Performed By: Physician Maxwell Caul., MD The following information was scribed by: [name] The information was scribed for: [name] Debridement Type: Debridement Severity of Tissue Pre Debridement: Fat layer exposed Level of Consciousness (Pre-procedure): Awake and Alert Pre-procedure Verification/Time Out Yes - 09:15 Taken: Start Time: 09:16 Pain Control: Lidocaine 4% T opical Solution Percent of Wound Bed Debrided: 100% T Area Debrided (cm): otal 0.2 Tissue and other material debrided: Viable, Non-Viable, Slough, Subcutaneous, Skin: Dermis , Skin: Epidermis, Slough Level: Skin/Subcutaneous Tissue Debridement Description: Excisional Instrument: Curette Bleeding: Moderate Hemostasis Achieved: Pressure End Time: 09:18 Procedural Pain: 0 Post Procedural Pain: 0 Response to Treatment: Procedure was tolerated well Level of Consciousness (Post- Awake and Alert procedure): Post Debridement Measurements of Total Wound Length: (cm) 0.5 Width: (cm) 0.5 Depth: (cm) 0.1 Volume: (cm) 0.02 Character of Wound/Ulcer Post Debridement: Requires Further Debridement Severity of Tissue Post Debridement: Fat layer exposed Post Procedure Diagnosis OTHAR, NEVILLS ([id number]) [id number] Page 4 of 12 Same as Pre-procedure Electronic Signature(s) Signed: 04/29/2023  3:56:56 PM By: [name] MD Entered By: [name] on 04/29/2023 01:02:72 -------------------------------------------------------------------------------- HPI Details Patient Name: Date of Service: [name], [name] 04/29/2023 9:00 A M Medical Record Number: [id number] Patient Account Number: [id number] Date of Birth/Sex: Treating RN: 03-23-1956 (67 y.o. M) Primary Care Provider: Cranford Mon Other Clinician: Referring Provider: Treating Provider/Extender: [name] in Treatment: 18 History of Present Illness HPI Description: 12/24/2022 Mr. Lathen Cucco is a 67 year old male with a past medical history of controlled type 2 diabetes on insulin, lymphedema/chronic venous insufficiency and cirrhosis that presents to the clinic for a 1 year history of nonhealing wounds to his lower extremities bilaterally. He reports the starting spontaneously. He states that he has been following with [facility] and they have been using [facility] antibiotic spray to the legs. He is not wearing compression stockings or wraps. He is also tried Medihoney and collagen in the past with little benefit. He currently denies systemic signs of infection. 5/30; patient presents for follow-up. He has been using triamcinolone cream to the periwound and Santyl to the wound beds. There has been improvement in  has not started this yet. Overall wounds appear well-healing. 9/9; patient presents for follow-up. We have been using PolyMem silver with Santyl. Wounds are smaller. He has been using his compression Velcro wrap daily. He declines debridement today. 9/23; this is a patient who clearly has chronic venous insufficiency. For about the last 18 months he has developed recurrent small nodules on his bilateral lower legs these are painful initially seemed to have a white substance on the top they then break open and eventually will heal over. These occurred even while he was under compression. 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 The patient has been  using Santyl and an polymen Ag. He has been using his own compression stockings. VIKTOR, GIANGREGORIO ([id number]) [id number] Page 5 of 12 Electronic Signature(s) Signed: 04/29/2023 3:56:56 PM By: [name] MD Entered By: [name] on 04/29/2023 78:29:56 -------------------------------------------------------------------------------- Physical Exam Details Patient Name: Date of Service: [name], [name] 04/29/2023 9:00 A M Medical Record Number: [id number] Patient Account Number: [id number] Date of Birth/Sex: Treating RN: Sep 17, 1955 (67 y.o. M) Primary Care Provider: Cranford Mon Other Clinician: Referring Provider: Treating Provider/Extender: [name] in Treatment: 18 Constitutional Sitting or standing Blood Pressure is within target range for patient.. Pulse regular and within target range for patient.Marland Kitchen Respirations regular, non-labored and within target range.. Temperature is normal and within the target range for the patient.Marland Kitchen Appears in no distress. Respiratory work of breathing is normal. Cardiovascular Hemosiderin deposition bilaterally. Edema is under fairly good control. Notes Wound exam; the patient has 3 small areas that remain 2 on the lateral lower leg on the right and 1 posteriorly on the left. I used a #3 curette to clean up the surfaces of all of these. What looks like pus underneath the surface is actually subcutaneous debris. I see the biopsy site on the left posterior Achilles area but this seems to be healed or almost healed. Electronic Signature(s) Signed: 04/29/2023 3:56:56 PM By: [name] MD Entered By: [name] on 04/29/2023 06:30:58 -------------------------------------------------------------------------------- Physician Orders Details Patient Name: Date of Service: [name], [name] 04/29/2023 9:00 A M Medical Record Number: [id number] Patient Account Number: [id number] Date of  Birth/Sex: Treating RN: Jul 07, 1956 (67 y.o. [name], [name] Primary Care Provider: Cranford Mon Other Clinician: Referring Provider: Treating Provider/Extender: [name] in Treatment: 18 The following information was scribed by: [name] The information was scribed for: [name] Verbal / Phone Orders: No Diagnosis Coding ICD-10 Coding Code Description I87.313 Chronic venous hypertension (idiopathic) with ulcer of bilateral lower extremity L97.818 Non-pressure chronic ulcer of other part of right lower leg with other specified severity L97.828 Non-pressure chronic ulcer of other part of left lower leg with other specified severity 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 - Dr Leanord Hawking - Room 9 Monday 1030 05/06/2023 Return A ppointment in 2 weeks. - Dr. Leanord Hawking - Room 9 Monday [facility] office to schedule) Return A Return appointment in 3 weeks. - Dr. Leanord Hawking - Room 9 Monday [facility] office to schedule) Anesthetic (In clinic) Topical Lidocaine 4% applied to wound bed Bathing/ Shower/ Hygiene May shower and wash wound with soap and water. Edema Control - Lymphedema /  has not started this yet. Overall wounds appear well-healing. 9/9; patient presents for follow-up. We have been using PolyMem silver with Santyl. Wounds are smaller. He has been using his compression Velcro wrap daily. He declines debridement today. 9/23; this is a patient who clearly has chronic venous insufficiency. For about the last 18 months he has developed recurrent small nodules on his bilateral lower legs these are painful initially seemed to have a white substance on the top they then break open and eventually will heal over. These occurred even while he was under compression. 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 The patient has been  using Santyl and an polymen Ag. He has been using his own compression stockings. VIKTOR, GIANGREGORIO ([id number]) [id number] Page 5 of 12 Electronic Signature(s) Signed: 04/29/2023 3:56:56 PM By: [name] MD Entered By: [name] on 04/29/2023 78:29:56 -------------------------------------------------------------------------------- Physical Exam Details Patient Name: Date of Service: [name], [name] 04/29/2023 9:00 A M Medical Record Number: [id number] Patient Account Number: [id number] Date of Birth/Sex: Treating RN: Sep 17, 1955 (67 y.o. M) Primary Care Provider: Cranford Mon Other Clinician: Referring Provider: Treating Provider/Extender: [name] in Treatment: 18 Constitutional Sitting or standing Blood Pressure is within target range for patient.. Pulse regular and within target range for patient.Marland Kitchen Respirations regular, non-labored and within target range.. Temperature is normal and within the target range for the patient.Marland Kitchen Appears in no distress. Respiratory work of breathing is normal. Cardiovascular Hemosiderin deposition bilaterally. Edema is under fairly good control. Notes Wound exam; the patient has 3 small areas that remain 2 on the lateral lower leg on the right and 1 posteriorly on the left. I used a #3 curette to clean up the surfaces of all of these. What looks like pus underneath the surface is actually subcutaneous debris. I see the biopsy site on the left posterior Achilles area but this seems to be healed or almost healed. Electronic Signature(s) Signed: 04/29/2023 3:56:56 PM By: [name] MD Entered By: [name] on 04/29/2023 06:30:58 -------------------------------------------------------------------------------- Physician Orders Details Patient Name: Date of Service: [name], [name] 04/29/2023 9:00 A M Medical Record Number: [id number] Patient Account Number: [id number] Date of  Birth/Sex: Treating RN: Jul 07, 1956 (67 y.o. [name], [name] Primary Care Provider: Cranford Mon Other Clinician: Referring Provider: Treating Provider/Extender: [name] in Treatment: 18 The following information was scribed by: [name] The information was scribed for: [name] Verbal / Phone Orders: No Diagnosis Coding ICD-10 Coding Code Description I87.313 Chronic venous hypertension (idiopathic) with ulcer of bilateral lower extremity L97.818 Non-pressure chronic ulcer of other part of right lower leg with other specified severity L97.828 Non-pressure chronic ulcer of other part of left lower leg with other specified severity 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 - Dr Leanord Hawking - Room 9 Monday 1030 05/06/2023 Return A ppointment in 2 weeks. - Dr. Leanord Hawking - Room 9 Monday [facility] office to schedule) Return A Return appointment in 3 weeks. - Dr. Leanord Hawking - Room 9 Monday [facility] office to schedule) Anesthetic (In clinic) Topical Lidocaine 4% applied to wound bed Bathing/ Shower/ Hygiene May shower and wash wound with soap and water. Edema Control - Lymphedema /  in (Generic) 1 x Per Day/15 Days Discharge Instructions: Apply over primary dressing as directed. Secured With: [employer], 4.5x3.1 (in/yd) (Generic) 1 x Per Day/15 Days Discharge Instructions: Secure with Kerlix as directed. Secured With: 38M Medipore H Soft Cloth Surgical T ape, 4 x 10 (in/yd) (Generic) 1 x Per Day/15 Days Discharge Instructions: Secure with tape as directed. Electronic Signature(s) Signed: 04/29/2023 3:56:56 PM By: [name]  MD Signed: 04/29/2023 4:21:40 PM By: [name] RN, BSN Entered By: [name] on 04/29/2023 06:23:13 -------------------------------------------------------------------------------- Problem List Details Patient Name: Date of Service: [name], [name] 04/29/2023 9:00 A M Medical Record Number: [id number] Patient Account Number: [id number] Date of Birth/Sex: Treating RN: 1955-10-24 (67 y.o. 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 04/29/2023 9:00 A M Medical Record Number: [id number] Patient Account Number: [id number] Date of Birth/Sex: Treating RN: 06/26/1956 (67 y.o. M) Primary Care Provider: Cranford Mon Other Clinician: Referring Provider: Treating Provider/Extender: [name] in Treatment:  18 Subjective History of Present Illness (HPI) 12/24/2022 Mr. Jaquinton Gery is a 67 year old male with a past medical history of controlled type 2 diabetes on insulin, lymphedema/chronic venous insufficiency and cirrhosis that presents to the clinic for a 1 year history of nonhealing wounds to his lower extremities bilaterally. He reports the starting spontaneously. He states that he has been following with [facility] and they have been using [facility] antibiotic spray to the legs. He is not wearing compression stockings or wraps. He is also tried Medihoney and collagen in the past with little benefit. He currently denies systemic signs of infection. 5/30; patient presents for follow-up. He has been using triamcinolone cream to the periwound and Santyl to the wound beds. There has been improvement in wound healing. He denies signs of infection and has no issues or complaints today. 6/4; both legs look considerably better. He has a wound on the right lateral lower leg and the left medial lower leg. Significant hemosiderin in the right leg less so on the left. We have been using Hydrofera Blue under compression 6/13; patient presents for follow-up. We have been using antibiotic ointment with Hydrofera Blue under compression therapy. The wounds are smaller. He has no issues or complaints today. We discussed ordering juxta lite compression garment wraps T use once his wounds heal as he has hard time putting on o compression stockings. He was agreeable with this. 6/20; patient presents for follow-up. We have been using antibiotic ointment with Hydrofera Blue under compression therapy to the lower extremities bilaterally. Wounds are smaller. He has been approved for  instructed Secondary Dressing: Woven Gauze  Sponge, Non-Sterile 4x4 in (Generic) 1 x Per Day/15 Days Discharge Instructions: Apply over primary dressing as directed. Secured With: [employer], 4.5x3.1 (in/yd) (Generic) 1 x Per Day/15 Days Discharge Instructions: Secure with Kerlix as directed. Secured With: 18M Medipore H Soft Cloth Surgical T ape, 4 x 10 (in/yd) (Generic) 1 x Per Day/15 Days Discharge Instructions: Secure with tape as directed. WOUND #5: - Lower Leg Wound Laterality: Right, Lateral, Distal Cleanser: Soap and Water 1 x Per Day/15 Days Discharge Instructions: May shower and wash wound with dial antibacterial soap and water prior to dressing change. ELIJUAH, YECK ([id number]) [id number] Page 11 of 12 Peri-Wound Care: Triamcinolone 15 (g) 1 x Per Day/15 Days Discharge Instructions: Use triamcinolone apply to entire both legs. around the wounds. Prim Dressing: PolyMem Silver Non-Adhesive Dressing, 4.25x4.25 in 1 x Per Day/15 Days ary Discharge Instructions: Apply to wound bed as instructed Secondary Dressing: Woven Gauze Sponge, Non-Sterile 4x4 in (Generic) 1 x Per Day/15 Days Discharge Instructions: Apply over primary dressing as directed. Secured With: [employer], 4.5x3.1 (in/yd) (Generic) 1 x Per Day/15 Days Discharge Instructions: Secure with Kerlix as directed. Secured With: 18M Medipore H Soft Cloth Surgical T ape, 4 x 10 (in/yd) (Generic) 1 x Per Day/15 Days Discharge Instructions: Secure with tape as directed. WOUND #6: - Lower Leg Wound Laterality: Left, Posterior, Proximal Cleanser: Soap and Water 1 x Per Day/15 Days Discharge Instructions: May shower and wash wound with dial antibacterial soap and water prior to dressing change. Peri-Wound Care: Triamcinolone 15 (g) 1 x Per Day/15 Days Discharge Instructions: Use triamcinolone apply to entire both legs. around the wounds. Prim Dressing: PolyMem Silver Non-Adhesive Dressing, 4.25x4.25 in 1 x Per Day/15  Days ary Discharge Instructions: Apply to wound bed as instructed Secondary Dressing: Woven Gauze Sponge, Non-Sterile 4x4 in (Generic) 1 x Per Day/15 Days Discharge Instructions: Apply over primary dressing as directed. Secured With: [employer], 4.5x3.1 (in/yd) (Generic) 1 x Per Day/15 Days Discharge Instructions: Secure with Kerlix as directed. Secured With: 18M Medipore H Soft Cloth Surgical T ape, 4 x 10 (in/yd) (Generic) 1 x Per Day/15 Days Discharge Instructions: Secure with tape as directed. 1. From what I saw today it looks as though the patient has a separate diagnosis from his chronic venous hypertension. He is developing subcutaneous nodules on his lower extremities that do not seem well-controlled by compression wraps or his stockings. 2. I discontinued the Santyl for now just using polymen under his own compression he can change this every 2 days 3. 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 He does not have any lesions anywhere else. 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 Electronic Signature(s) Signed: 04/29/2023 3:56:56 PM By: [name] MD Entered By: [name] on 04/29/2023 08:05:04 -------------------------------------------------------------------------------- SuperBill Details Patient Name: Date of Service: [name] 04/29/2023 Medical Record Number: [id number] Patient Account Number: [id number] Date of Birth/Sex: Treating RN: Jul 26, 1956 (67 y.o. [name] Primary Care Provider: Cranford Mon Other  Clinician: Referring Provider: Treating Provider/Extender: Leanord Hawking  in (Generic) 1 x Per Day/15 Days Discharge Instructions: Apply over primary dressing as directed. Secured With: [employer], 4.5x3.1 (in/yd) (Generic) 1 x Per Day/15 Days Discharge Instructions: Secure with Kerlix as directed. Secured With: 38M Medipore H Soft Cloth Surgical T ape, 4 x 10 (in/yd) (Generic) 1 x Per Day/15 Days Discharge Instructions: Secure with tape as directed. Electronic Signature(s) Signed: 04/29/2023 3:56:56 PM By: [name]  MD Signed: 04/29/2023 4:21:40 PM By: [name] RN, BSN Entered By: [name] on 04/29/2023 06:23:13 -------------------------------------------------------------------------------- Problem List Details Patient Name: Date of Service: [name], [name] 04/29/2023 9:00 A M Medical Record Number: [id number] Patient Account Number: [id number] Date of Birth/Sex: Treating RN: 1955-10-24 (67 y.o. 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 04/29/2023 9:00 A M Medical Record Number: [id number] Patient Account Number: [id number] Date of Birth/Sex: Treating RN: 06/26/1956 (67 y.o. M) Primary Care Provider: Cranford Mon Other Clinician: Referring Provider: Treating Provider/Extender: [name] in Treatment:  18 Subjective History of Present Illness (HPI) 12/24/2022 Mr. Jaquinton Gery is a 67 year old male with a past medical history of controlled type 2 diabetes on insulin, lymphedema/chronic venous insufficiency and cirrhosis that presents to the clinic for a 1 year history of nonhealing wounds to his lower extremities bilaterally. He reports the starting spontaneously. He states that he has been following with [facility] and they have been using [facility] antibiotic spray to the legs. He is not wearing compression stockings or wraps. He is also tried Medihoney and collagen in the past with little benefit. He currently denies systemic signs of infection. 5/30; patient presents for follow-up. He has been using triamcinolone cream to the periwound and Santyl to the wound beds. There has been improvement in wound healing. He denies signs of infection and has no issues or complaints today. 6/4; both legs look considerably better. He has a wound on the right lateral lower leg and the left medial lower leg. Significant hemosiderin in the right leg less so on the left. We have been using Hydrofera Blue under compression 6/13; patient presents for follow-up. We have been using antibiotic ointment with Hydrofera Blue under compression therapy. The wounds are smaller. He has no issues or complaints today. We discussed ordering juxta lite compression garment wraps T use once his wounds heal as he has hard time putting on o compression stockings. He was agreeable with this. 6/20; patient presents for follow-up. We have been using antibiotic ointment with Hydrofera Blue under compression therapy to the lower extremities bilaterally. Wounds are smaller. He has been approved for  has not started this yet. Overall wounds appear well-healing. 9/9; patient presents for follow-up. We have been using PolyMem silver with Santyl. Wounds are smaller. He has been using his compression Velcro wrap daily. He declines debridement today. 9/23; this is a patient who clearly has chronic venous insufficiency. For about the last 18 months he has developed recurrent small nodules on his bilateral lower legs these are painful initially seemed to have a white substance on the top they then break open and eventually will heal over. These occurred even while he was under compression. 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 The patient has been  using Santyl and an polymen Ag. He has been using his own compression stockings. VIKTOR, GIANGREGORIO ([id number]) [id number] Page 5 of 12 Electronic Signature(s) Signed: 04/29/2023 3:56:56 PM By: [name] MD Entered By: [name] on 04/29/2023 78:29:56 -------------------------------------------------------------------------------- Physical Exam Details Patient Name: Date of Service: [name], [name] 04/29/2023 9:00 A M Medical Record Number: [id number] Patient Account Number: [id number] Date of Birth/Sex: Treating RN: Sep 17, 1955 (67 y.o. M) Primary Care Provider: Cranford Mon Other Clinician: Referring Provider: Treating Provider/Extender: [name] in Treatment: 18 Constitutional Sitting or standing Blood Pressure is within target range for patient.. Pulse regular and within target range for patient.Marland Kitchen Respirations regular, non-labored and within target range.. Temperature is normal and within the target range for the patient.Marland Kitchen Appears in no distress. Respiratory work of breathing is normal. Cardiovascular Hemosiderin deposition bilaterally. Edema is under fairly good control. Notes Wound exam; the patient has 3 small areas that remain 2 on the lateral lower leg on the right and 1 posteriorly on the left. I used a #3 curette to clean up the surfaces of all of these. What looks like pus underneath the surface is actually subcutaneous debris. I see the biopsy site on the left posterior Achilles area but this seems to be healed or almost healed. Electronic Signature(s) Signed: 04/29/2023 3:56:56 PM By: [name] MD Entered By: [name] on 04/29/2023 06:30:58 -------------------------------------------------------------------------------- Physician Orders Details Patient Name: Date of Service: [name], [name] 04/29/2023 9:00 A M Medical Record Number: [id number] Patient Account Number: [id number] Date of  Birth/Sex: Treating RN: Jul 07, 1956 (67 y.o. [name], [name] Primary Care Provider: Cranford Mon Other Clinician: Referring Provider: Treating Provider/Extender: [name] in Treatment: 18 The following information was scribed by: [name] The information was scribed for: [name] Verbal / Phone Orders: No Diagnosis Coding ICD-10 Coding Code Description I87.313 Chronic venous hypertension (idiopathic) with ulcer of bilateral lower extremity L97.818 Non-pressure chronic ulcer of other part of right lower leg with other specified severity L97.828 Non-pressure chronic ulcer of other part of left lower leg with other specified severity 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 - Dr Leanord Hawking - Room 9 Monday 1030 05/06/2023 Return A ppointment in 2 weeks. - Dr. Leanord Hawking - Room 9 Monday [facility] office to schedule) Return A Return appointment in 3 weeks. - Dr. Leanord Hawking - Room 9 Monday [facility] office to schedule) Anesthetic (In clinic) Topical Lidocaine 4% applied to wound bed Bathing/ Shower/ Hygiene May shower and wash wound with soap and water. Edema Control - Lymphedema /  in (Generic) 1 x Per Day/15 Days Discharge Instructions: Apply over primary dressing as directed. Secured With: [employer], 4.5x3.1 (in/yd) (Generic) 1 x Per Day/15 Days Discharge Instructions: Secure with Kerlix as directed. Secured With: 38M Medipore H Soft Cloth Surgical T ape, 4 x 10 (in/yd) (Generic) 1 x Per Day/15 Days Discharge Instructions: Secure with tape as directed. Electronic Signature(s) Signed: 04/29/2023 3:56:56 PM By: [name]  MD Signed: 04/29/2023 4:21:40 PM By: [name] RN, BSN Entered By: [name] on 04/29/2023 06:23:13 -------------------------------------------------------------------------------- Problem List Details Patient Name: Date of Service: [name], [name] 04/29/2023 9:00 A M Medical Record Number: [id number] Patient Account Number: [id number] Date of Birth/Sex: Treating RN: 1955-10-24 (67 y.o. 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 04/29/2023 9:00 A M Medical Record Number: [id number] Patient Account Number: [id number] Date of Birth/Sex: Treating RN: 06/26/1956 (67 y.o. M) Primary Care Provider: Cranford Mon Other Clinician: Referring Provider: Treating Provider/Extender: [name] in Treatment:  18 Subjective History of Present Illness (HPI) 12/24/2022 Mr. Jaquinton Gery is a 67 year old male with a past medical history of controlled type 2 diabetes on insulin, lymphedema/chronic venous insufficiency and cirrhosis that presents to the clinic for a 1 year history of nonhealing wounds to his lower extremities bilaterally. He reports the starting spontaneously. He states that he has been following with [facility] and they have been using [facility] antibiotic spray to the legs. He is not wearing compression stockings or wraps. He is also tried Medihoney and collagen in the past with little benefit. He currently denies systemic signs of infection. 5/30; patient presents for follow-up. He has been using triamcinolone cream to the periwound and Santyl to the wound beds. There has been improvement in wound healing. He denies signs of infection and has no issues or complaints today. 6/4; both legs look considerably better. He has a wound on the right lateral lower leg and the left medial lower leg. Significant hemosiderin in the right leg less so on the left. We have been using Hydrofera Blue under compression 6/13; patient presents for follow-up. We have been using antibiotic ointment with Hydrofera Blue under compression therapy. The wounds are smaller. He has no issues or complaints today. We discussed ordering juxta lite compression garment wraps T use once his wounds heal as he has hard time putting on o compression stockings. He was agreeable with this. 6/20; patient presents for follow-up. We have been using antibiotic ointment with Hydrofera Blue under compression therapy to the lower extremities bilaterally. Wounds are smaller. He has been approved for

## 2023-04-29 NOTE — Progress Notes (Signed)
Crepitus: No Cyanosis: No Excoriation: No Ecchymosis: No Induration: No Erythema: No Rash: No Hemosiderin Staining: No Scarring: No Mottled: No Pallor: No [address] [name] [name], [name] [name] ([id number]) [id number] Page 11 of 15 Rubor: No No Abnormalities Noted: No Dry / Scaly: No Temperature / Pain Maceration: No Temperature: No Abnormality Treatment Notes Wound #4 (Lower Leg) Wound Laterality: Left, Medial, Distal Cleanser Soap and Water Discharge Instruction: May shower and wash wound with dial antibacterial soap and water prior to dressing change. Peri-Wound Care Triamcinolone 15 (g) Discharge Instruction: Use triamcinolone apply to entire both legs. around the wounds. Topical Primary Dressing PolyMem Silver Non-Adhesive Dressing, 4.25x4.25 in Discharge Instruction: Apply to wound  bed as instructed Secondary Dressing Woven Gauze Sponge, Non-Sterile 4x4 in Discharge Instruction: Apply over primary dressing as directed. Secured With [employer], 4.5x3.1 (in/yd) Discharge Instruction: Secure with Kerlix as directed. 26M Medipore H Soft Cloth Surgical T ape, 4 x 10 (in/yd) Discharge Instruction: Secure with tape as directed. Compression Wrap Compression Stockings Add-Ons Electronic Signature(s) Signed: 04/29/2023 10:10:27 AM By: Dayton Scrape Entered By: Dayton Scrape on 04/29/2023 06:05:25 -------------------------------------------------------------------------------- Wound Assessment Details Patient Name: Date of Service: [name] 74, [name] 04/29/2023 9:00 A M Medical Record Number: [id number] Patient Account Number: [id number] Date of Birth/Sex: Treating RN: 1955/11/12 (67 y.o. M) Primary Care Marissia Blackham: [place] Other Clinician: Referring Aseel Truxillo: Treating Shaquasha Gerstel/Extender: [name] in Treatment: 18 Wound Status Wound Number: 5 Primary Diabetic Wound/Ulcer of the Lower Extremity Etiology: Wound Location: Right, Distal, Lateral Lower Leg Wound Status: Open Wounding Event: Blister Comorbid Anemia, Lymphedema, Hypertension, Cirrhosis , Type II Date Acquired: 02/19/2023 History: Diabetes Weeks Of Treatment: 9 Clustered Wound: Yes Photos [name], [name] ([id number]) [id number] Page 12 of 15 Wound Measurements Length: (cm) 0.9 Width: (cm) 0.5 Depth: (cm) 0.1 Clustered Quantity: 1 Area: (cm) 0.353 Volume: (cm) 0.035 % Reduction in Area: 44.5% % Reduction in Volume: 45.3% Epithelialization: Small (1-33%) Wound Description Classification: Grade 1 Wound Margin: Distinct, outline attached Exudate Amount: Medium Exudate Type: Serosanguineous Exudate Color: red, brown Foul Odor After Cleansing: No Slough/Fibrino Yes Wound Bed Granulation Amount: Medium (34-66%) Exposed  Structure Granulation Quality: Red, Pink Fascia Exposed: No Necrotic Amount: Medium (34-66%) Fat Layer (Subcutaneous Tissue) Exposed: Yes Necrotic Quality: Adherent Slough Tendon Exposed: No Muscle Exposed: No Joint Exposed: No Bone Exposed: No Periwound Skin Texture Texture Color No Abnormalities Noted: No No Abnormalities Noted: No Callus: No Atrophie Blanche: No Crepitus: No Cyanosis: No Excoriation: No Ecchymosis: No Induration: No Erythema: No Rash: No Hemosiderin Staining: No Scarring: No Mottled: No Pallor: No Moisture Rubor: No No Abnormalities Noted: No Dry / Scaly: No Temperature / Pain Maceration: No Temperature: No Abnormality Treatment Notes Wound #5 (Lower Leg) Wound Laterality: Right, Lateral, Distal Cleanser Soap and Water Discharge Instruction: May shower and wash wound with dial antibacterial soap and water prior to dressing change. Peri-Wound Care Triamcinolone 15 (g) Discharge Instruction: Use triamcinolone apply to entire both legs. around the wounds. Topical Primary Dressing PolyMem Silver Non-Adhesive Dressing, 4.25x4.25 in Discharge Instruction: Apply to wound bed as instructed Secondary Dressing Woven Gauze Sponge, Non-Sterile 4x4 in Discharge Instruction: Apply over primary dressing as directed. Secured With EVANDER, RYCE ([id number]) [id number] Page 13 of 15 Kerlix Roll Sterile, 4.5x3.1 (in/yd) Discharge Instruction: Secure with Kerlix as directed. 26M Medipore H Soft Cloth Surgical T ape, 4 x 10 (in/yd) Discharge Instruction: Secure with tape as directed. Compression Wrap Compression Stockings Add-Ons Electronic Signature(s) Signed: 04/29/2023 10:10:27 AM By: Dayton Scrape  Crepitus: No Cyanosis: No Excoriation: No Ecchymosis: No Induration: No Erythema: No Rash: No Hemosiderin Staining: No Scarring: No Mottled: No Pallor: No [address] [name] [name], [name] [name] ([id number]) [id number] Page 11 of 15 Rubor: No No Abnormalities Noted: No Dry / Scaly: No Temperature / Pain Maceration: No Temperature: No Abnormality Treatment Notes Wound #4 (Lower Leg) Wound Laterality: Left, Medial, Distal Cleanser Soap and Water Discharge Instruction: May shower and wash wound with dial antibacterial soap and water prior to dressing change. Peri-Wound Care Triamcinolone 15 (g) Discharge Instruction: Use triamcinolone apply to entire both legs. around the wounds. Topical Primary Dressing PolyMem Silver Non-Adhesive Dressing, 4.25x4.25 in Discharge Instruction: Apply to wound  bed as instructed Secondary Dressing Woven Gauze Sponge, Non-Sterile 4x4 in Discharge Instruction: Apply over primary dressing as directed. Secured With [employer], 4.5x3.1 (in/yd) Discharge Instruction: Secure with Kerlix as directed. 26M Medipore H Soft Cloth Surgical T ape, 4 x 10 (in/yd) Discharge Instruction: Secure with tape as directed. Compression Wrap Compression Stockings Add-Ons Electronic Signature(s) Signed: 04/29/2023 10:10:27 AM By: Dayton Scrape Entered By: Dayton Scrape on 04/29/2023 06:05:25 -------------------------------------------------------------------------------- Wound Assessment Details Patient Name: Date of Service: [name] 74, [name] 04/29/2023 9:00 A M Medical Record Number: [id number] Patient Account Number: [id number] Date of Birth/Sex: Treating RN: 1955/11/12 (67 y.o. M) Primary Care Marissia Blackham: [place] Mon Other Clinician: Referring Aseel Truxillo: Treating Shaquasha Gerstel/Extender: [name] in Treatment: 18 Wound Status Wound Number: 5 Primary Diabetic Wound/Ulcer of the Lower Extremity Etiology: Wound Location: Right, Distal, Lateral Lower Leg Wound Status: Open Wounding Event: Blister Comorbid Anemia, Lymphedema, Hypertension, Cirrhosis , Type II Date Acquired: 02/19/2023 History: Diabetes Weeks Of Treatment: 9 Clustered Wound: Yes Photos [name], [name] ([id number]) [id number] Page 12 of 15 Wound Measurements Length: (cm) 0.9 Width: (cm) 0.5 Depth: (cm) 0.1 Clustered Quantity: 1 Area: (cm) 0.353 Volume: (cm) 0.035 % Reduction in Area: 44.5% % Reduction in Volume: 45.3% Epithelialization: Small (1-33%) Wound Description Classification: Grade 1 Wound Margin: Distinct, outline attached Exudate Amount: Medium Exudate Type: Serosanguineous Exudate Color: red, brown Foul Odor After Cleansing: No Slough/Fibrino Yes Wound Bed Granulation Amount: Medium (34-66%) Exposed  Structure Granulation Quality: Red, Pink Fascia Exposed: No Necrotic Amount: Medium (34-66%) Fat Layer (Subcutaneous Tissue) Exposed: Yes Necrotic Quality: Adherent Slough Tendon Exposed: No Muscle Exposed: No Joint Exposed: No Bone Exposed: No Periwound Skin Texture Texture Color No Abnormalities Noted: No No Abnormalities Noted: No Callus: No Atrophie Blanche: No Crepitus: No Cyanosis: No Excoriation: No Ecchymosis: No Induration: No Erythema: No Rash: No Hemosiderin Staining: No Scarring: No Mottled: No Pallor: No Moisture Rubor: No No Abnormalities Noted: No Dry / Scaly: No Temperature / Pain Maceration: No Temperature: No Abnormality Treatment Notes Wound #5 (Lower Leg) Wound Laterality: Right, Lateral, Distal Cleanser Soap and Water Discharge Instruction: May shower and wash wound with dial antibacterial soap and water prior to dressing change. Peri-Wound Care Triamcinolone 15 (g) Discharge Instruction: Use triamcinolone apply to entire both legs. around the wounds. Topical Primary Dressing PolyMem Silver Non-Adhesive Dressing, 4.25x4.25 in Discharge Instruction: Apply to wound bed as instructed Secondary Dressing Woven Gauze Sponge, Non-Sterile 4x4 in Discharge Instruction: Apply over primary dressing as directed. Secured With EVANDER, RYCE ([id number]) [id number] Page 13 of 15 Kerlix Roll Sterile, 4.5x3.1 (in/yd) Discharge Instruction: Secure with Kerlix as directed. 26M Medipore H Soft Cloth Surgical T ape, 4 x 10 (in/yd) Discharge Instruction: Secure with tape as directed. Compression Wrap Compression Stockings Add-Ons Electronic Signature(s) Signed: 04/29/2023 10:10:27 AM By: Dayton Scrape  With [employer] Sterile, 4.5x3.1 (in/yd) Discharge Instruction: Secure with Kerlix as directed. 1M Medipore H Soft Cloth Surgical T ape, 4 x 10 (in/yd) Discharge Instruction: Secure with tape as directed. Compression Wrap Compression Stockings Add-Ons Wound #6 (Lower Leg) Wound Laterality: Left, Posterior, Proximal Cleanser Soap and  Water Discharge Instruction: May shower and wash wound with dial antibacterial soap and water prior to dressing change. Peri-Wound Care Triamcinolone 15 (g) Discharge Instruction: Use triamcinolone apply to entire both legs. around the wounds. Topical Primary Dressing PolyMem Silver Non-Adhesive Dressing, 4.25x4.25 in Discharge Instruction: Apply to wound bed as instructed Secondary Dressing Woven Gauze Sponge, Non-Sterile 4x4 in Discharge Instruction: Apply over primary dressing as directed. Secured With [employer], 4.5x3.1 (in/yd) Discharge Instruction: Secure with Kerlix as directed. 1M Medipore H Soft Cloth Surgical T ape, 4 x 10 (in/yd) Discharge Instruction: Secure with tape as directed. Compression Wrap Compression Stockings Add-Ons Electronic Signature(s) Signed: 04/29/2023 3:56:56 PM By: [name] MD Entered By: [name] on 04/29/2023 42:59:56 -------------------------------------------------------------------------------- Multi-Disciplinary Care Plan Details Patient Name: Date of Service: [name] Dalbert Garnet, [name] 04/29/2023 9:00 A M Medical Record Number: [id number] Patient Account Number: [id number] Date of Birth/Sex: Treating RN: Apr 05, 1956 (67 y.o. [name] Primary Care Stephenie Navejas: [place] Other Clinician: Referring Kylyn Sookram: Treating Berlin Mokry/Extender: [name] in Treatment: [address] ANKITH, ISSA Webber ([id number]) [id number] Page 7 of 15 Pain, Acute or Chronic Nursing Diagnoses: Pain, acute or chronic: actual or potential Potential alteration in comfort, pain Goals: Patient will verbalize adequate pain control and receive pain control interventions during procedures as needed Date Initiated: 12/24/2022 Target Resolution Date: 05/08/2023 Goal Status: Active Patient/caregiver will verbalize comfort level met Date Initiated: 12/24/2022 Date Inactivated: 01/31/2023 Target  Resolution Date: 02/14/2023 Goal Status: Met Interventions: Encourage patient to take pain medications as prescribed Provide education on pain management Treatment Activities: Administer pain control measures as ordered : 12/24/2022 Notes: Electronic Signature(s) Signed: 04/29/2023 4:21:40 PM By: [name] RN, BSN Entered By: [name] on 04/29/2023 06:16:38 -------------------------------------------------------------------------------- Pain Assessment Details Patient Name: Date of Service: [name] Dalbert Garnet, [name] 04/29/2023 9:00 A M Medical Record Number: [id number] Patient Account Number: [id number] Date of Birth/Sex: Treating RN: [birth year] (67 y.o. M) Primary Care Quisha Mabie: [place] Other Clinician: Referring Mozell Hardacre: Treating Laythan Hayter/Extender: [name] in Treatment: 18 Active Problems Location of Pain Severity and Description of Pain Patient Has Paino No Site Locations Pain Management and Medication Current Pain Management: Electronic Signature(s) Signed: 04/29/2023 10:10:27 AM By: Dayton Scrape Entered By: Dayton Scrape on 04/29/2023 05:57:47 Cay Schillings ([id number]) [id number] Page 8 of 15 -------------------------------------------------------------------------------- Patient/Caregiver Education Details Patient Name: Date of Service: [name] [name] 9/23/2024andnbsp9:00 A M Medical Record Number: [id number] Patient Account Number: [id number] Date of Birth/Gender: Treating RN: [birth year] (67 y.o. [name] Primary Care Physician: [place] Mon Other Clinician: Referring Physician: Treating Physician/Extender: [name] in Treatment: 18 Education Assessment Education Provided To: Patient Education Topics Provided Wound/Skin Impairment: Handouts: Caring for Your Ulcer Methods: Explain/Verbal Responses: Reinforcements needed Electronic Signature(s) Signed: 04/29/2023  4:21:40 PM By: [name] RN, BSN Entered By: [name] on 04/29/2023 06:17:17 -------------------------------------------------------------------------------- Wound Assessment Details Patient Name: Date of Service: [name] Dalbert Garnet, [name] 04/29/2023 9:00 A M Medical Record Number: [id number] Patient Account Number: [id number] Date of Birth/Sex: Treating RN: [birth year] (67 y.o. M) Primary Care Shaylah Mcghie: [place] Other Clinician: Referring Briget Shaheed: Treating Poseidon Pam/Extender: [name]  KEMARION, CRAKER ([id number]) [id number] Page 1 of 15 Visit Report for 04/29/2023 Arrival Information Details Patient Name: Date of Service: [name] [name]. 04/29/2023 9:00 A M Medical Record Number: [id number] Patient Account Number: [id number] Date of Birth/Sex: Treating RN: May 23, 1956 (67 y.o. M) Primary Care Erykah Lippert: [place] Other Clinician: Referring Jonavin Seder: Treating Mariateresa Batra/Extender: [name] in Treatment: 18 Visit Information History Since Last Visit Added or deleted any medications: No Patient Arrived: Ambulatory Any new allergies or adverse reactions: No Arrival Time: 08:56 Had a fall or experienced change in No Accompanied By: sister activities of daily living that may affect Transfer Assistance: None risk of falls: Patient Identification Verified: Yes Signs or symptoms of abuse/neglect since last visito No Secondary Verification Process Completed: Yes Hospitalized since last visit: No Patient Requires Transmission-Based No Implantable device outside of the clinic excluding No Precautions: cellular tissue based products placed in the center Patient Has Alerts: Yes since last visit: Patient Alerts: 10/23 ABI L0.94 R1 VVS Pain Present Now: No 10/23 TBI L1.2 R0.88 VVS Electronic Signature(s) Signed: 04/29/2023 10:10:27 AM By: Dayton Scrape Entered By: Dayton Scrape on 04/29/2023 05:57:20 -------------------------------------------------------------------------------- Encounter Discharge Information Details Patient Name: Date of Service: [name] [name], [name] 04/29/2023 9:00 A M Medical Record Number: [id number] Patient Account Number: [id number] Date of Birth/Sex: Treating RN: 02-Jun-1956 (67 y.o. [name] Primary Care Sheccid Lahmann: [place] Other Clinician: Referring Kashauna Celmer: Treating Aisa Schoeppner/Extender: [name] in Treatment: 18 Encounter Discharge Information Items Post  Procedure Vitals Discharge Condition: Stable Temperature (F): 97.8 Ambulatory Status: Ambulatory Pulse (bpm): 63 Discharge Destination: Home Respiratory Rate (breaths/min): 18 Transportation: Private Auto Blood Pressure (mmHg): 133/69 Accompanied By: sister Schedule Follow-up Appointment: Yes Clinical Summary of Care: Electronic Signature(s) Signed: 04/29/2023 4:21:40 PM By: [name] RN, BSN Entered By: [name] on 04/29/2023 06:24:26 Cay Schillings ([id number]) [id number] Page 2 of 15 -------------------------------------------------------------------------------- Lower Extremity Assessment Details Patient Name: Date of Service: [name] 04/29/2023 9:00 A M Medical Record Number: [id number] Patient Account Number: [id number] Date of Birth/Sex: Treating RN: 02/09/1956 (67 y.o. [name] Primary Care Lanae Federer: [place] Other Clinician: Referring Anjanae Woehrle: Treating Cyleigh Massaro/Extender: [name] in Treatment: 18 Edema Assessment Assessed: [name]: Yes] [name]: Yes] Edema: [Left: No] [Right: No] Calf Left: Right: Point of Measurement: 37 cm From Medial Instep 33 cm 33 cm Ankle Left: Right: Point of Measurement: 13 cm From Medial Instep 22 cm 21.8 cm Vascular Assessment Pulses: Dorsalis Pedis Palpable: [Left:Yes] [Right:Yes] Extremity colors, hair growth, and conditions: Extremity Color: [Left:Hyperpigmented] [Right:Hyperpigmented] Hair Growth on Extremity: [Left:No] [Right:No] Temperature of Extremity: [Left:Warm] [Right:Warm] Capillary Refill: [Left:< 3 seconds] [Right:< 3 seconds] Dependent Rubor: [Left:No] [Right:No] Blanched when Elevated: [Left:No Yes] [Right:No Yes] Toe Nail Assessment Left: Right: Thick: Yes Yes Discolored: Yes Yes Deformed: Yes Yes Improper Length and Hygiene: No No Electronic Signature(s) Signed: 04/29/2023 4:21:40 PM By: [name] RN, BSN Entered By: [name] on 04/29/2023 06:16:04 -------------------------------------------------------------------------------- Multi Wound Chart Details Patient Name: Date of Service: [name] Dalbert Garnet, [name] 04/29/2023 9:00 A M Medical Record Number: [id number] Patient Account Number: [id number] Date of Birth/Sex: Treating RN: 1956-03-16 (67 y.o. M) Primary Care Macario Shear: [place] Other Clinician: Referring Korban Shearer: Treating Lizandra Zakrzewski/Extender: [name] in Treatment: 18 Vital Signs Height(in): 72 Capillary Blood Glucose(mg/dl): 75 Weight(lbs): 202 Pulse(bpm): [address] M ([id number]) [id number] Page 3 of 15 Body Mass Index(BMI):  KEMARION, CRAKER ([id number]) [id number] Page 1 of 15 Visit Report for 04/29/2023 Arrival Information Details Patient Name: Date of Service: [name] [name]. 04/29/2023 9:00 A M Medical Record Number: [id number] Patient Account Number: [id number] Date of Birth/Sex: Treating RN: May 23, 1956 (67 y.o. M) Primary Care Erykah Lippert: [place] Other Clinician: Referring Jonavin Seder: Treating Mariateresa Batra/Extender: [name] in Treatment: 18 Visit Information History Since Last Visit Added or deleted any medications: No Patient Arrived: Ambulatory Any new allergies or adverse reactions: No Arrival Time: 08:56 Had a fall or experienced change in No Accompanied By: sister activities of daily living that may affect Transfer Assistance: None risk of falls: Patient Identification Verified: Yes Signs or symptoms of abuse/neglect since last visito No Secondary Verification Process Completed: Yes Hospitalized since last visit: No Patient Requires Transmission-Based No Implantable device outside of the clinic excluding No Precautions: cellular tissue based products placed in the center Patient Has Alerts: Yes since last visit: Patient Alerts: 10/23 ABI L0.94 R1 VVS Pain Present Now: No 10/23 TBI L1.2 R0.88 VVS Electronic Signature(s) Signed: 04/29/2023 10:10:27 AM By: Dayton Scrape Entered By: Dayton Scrape on 04/29/2023 05:57:20 -------------------------------------------------------------------------------- Encounter Discharge Information Details Patient Name: Date of Service: [name] [name], [name] 04/29/2023 9:00 A M Medical Record Number: [id number] Patient Account Number: [id number] Date of Birth/Sex: Treating RN: 02-Jun-1956 (67 y.o. [name] Primary Care Sheccid Lahmann: [place] Other Clinician: Referring Kashauna Celmer: Treating Aisa Schoeppner/Extender: [name] in Treatment: 18 Encounter Discharge Information Items Post  Procedure Vitals Discharge Condition: Stable Temperature (F): 97.8 Ambulatory Status: Ambulatory Pulse (bpm): 63 Discharge Destination: Home Respiratory Rate (breaths/min): 18 Transportation: Private Auto Blood Pressure (mmHg): 133/69 Accompanied By: sister Schedule Follow-up Appointment: Yes Clinical Summary of Care: Electronic Signature(s) Signed: 04/29/2023 4:21:40 PM By: [name] RN, BSN Entered By: [name] on 04/29/2023 06:24:26 Cay Schillings ([id number]) [id number] Page 2 of 15 -------------------------------------------------------------------------------- Lower Extremity Assessment Details Patient Name: Date of Service: [name] 04/29/2023 9:00 A M Medical Record Number: [id number] Patient Account Number: [id number] Date of Birth/Sex: Treating RN: 02/09/1956 (67 y.o. [name] Primary Care Lanae Federer: [place] Other Clinician: Referring Anjanae Woehrle: Treating Cyleigh Massaro/Extender: [name] in Treatment: 18 Edema Assessment Assessed: [name]: Yes] [name]: Yes] Edema: [Left: No] [Right: No] Calf Left: Right: Point of Measurement: 37 cm From Medial Instep 33 cm 33 cm Ankle Left: Right: Point of Measurement: 13 cm From Medial Instep 22 cm 21.8 cm Vascular Assessment Pulses: Dorsalis Pedis Palpable: [Left:Yes] [Right:Yes] Extremity colors, hair growth, and conditions: Extremity Color: [Left:Hyperpigmented] [Right:Hyperpigmented] Hair Growth on Extremity: [Left:No] [Right:No] Temperature of Extremity: [Left:Warm] [Right:Warm] Capillary Refill: [Left:< 3 seconds] [Right:< 3 seconds] Dependent Rubor: [Left:No] [Right:No] Blanched when Elevated: [Left:No Yes] [Right:No Yes] Toe Nail Assessment Left: Right: Thick: Yes Yes Discolored: Yes Yes Deformed: Yes Yes Improper Length and Hygiene: No No Electronic Signature(s) Signed: 04/29/2023 4:21:40 PM By: [name] RN, BSN Entered By: [name] on 04/29/2023 06:16:04 -------------------------------------------------------------------------------- Multi Wound Chart Details Patient Name: Date of Service: [name] Dalbert Garnet, [name] 04/29/2023 9:00 A M Medical Record Number: [id number] Patient Account Number: [id number] Date of Birth/Sex: Treating RN: 1956-03-16 (67 y.o. M) Primary Care Macario Shear: [place] Other Clinician: Referring Korban Shearer: Treating Lizandra Zakrzewski/Extender: [name] in Treatment: 18 Vital Signs Height(in): 72 Capillary Blood Glucose(mg/dl): 75 Weight(lbs): 202 Pulse(bpm): [address] M ([id number]) [id number] Page 3 of 15 Body Mass Index(BMI):  [place] Weeks in Treatment: 18 Wound Status Wound Number: 3 Primary Diabetic Wound/Ulcer of the Lower Extremity Etiology: Wound Location: Right, Proximal, Anterior Lower Leg Wound Status: Open Wounding Event: Gradually Appeared Comorbid Anemia, Lymphedema, Hypertension, Cirrhosis , Type II Date Acquired: 01/25/2023 History: Diabetes Weeks Of Treatment: 12 Clustered Wound: No Photos SAMMUAL, LONGWITH ([id number]) [id number] Page 9 of 15 Wound Measurements Length: (cm) 0.9 Width: (cm) 0.3 Depth: (cm) 0.1 Clustered Quantity: 3 Area: (cm) 0.212 Volume: (cm) 0.021 % Reduction in Area: -198.6% % Reduction in Volume: -50% Epithelialization: Small (1-33%) Wound Description Classification: Grade 1 Wound Margin: Distinct, outline attached Exudate Amount: Medium Exudate Type: Serosanguineous Exudate Color: red, brown Foul Odor After Cleansing: No Slough/Fibrino Yes Wound Bed Granulation Amount: None Present (0%) Exposed Structure Necrotic Amount: Large (67-100%) Fascia Exposed: No Necrotic Quality: Adherent Slough Fat Layer (Subcutaneous Tissue) Exposed: Yes Tendon Exposed: No Muscle Exposed: No Joint Exposed: No Bone Exposed: No Periwound Skin Texture Texture Color No Abnormalities Noted: No No Abnormalities Noted: No Callus: No Atrophie Blanche: No Crepitus: No Cyanosis: No Excoriation: No Ecchymosis: No Induration: No Erythema: No Rash:  No Hemosiderin Staining: No Scarring: No Mottled: No Pallor: No Moisture Rubor: No No Abnormalities Noted: No Dry / Scaly: No Temperature / Pain Maceration: No Temperature: No Abnormality Treatment Notes Wound #3 (Lower Leg) Wound Laterality: Right, Anterior, Proximal Cleanser Soap and Water Discharge Instruction: May shower and wash wound with dial antibacterial soap and water prior to dressing change. Peri-Wound Care Triamcinolone 15 (g) Discharge Instruction: Use triamcinolone apply to entire both legs. around the wounds. Topical Primary Dressing PolyMem Silver Non-Adhesive Dressing, 4.25x4.25 in Discharge Instruction: Apply to wound bed as instructed Secondary Dressing Woven Gauze Sponge, Non-Sterile 4x4 in Discharge Instruction: Apply over primary dressing as directed. Secured With [employer], 4.5x3.1 (in/yd) Discharge Instruction: Secure with Kerlix as directed. 90M Medipore H Soft Cloth Surgical T ape, 4 x 10 (in/yd) Discharge Instruction: Secure with tape as directed. Compression Wrap Compression Stockings Add-Ons Electronic Signature(s) Signed: 04/29/2023 10:10:27 AM By: Elsworth Soho, Signed: 04/29/2023 10:10:27 AM By: Doy Hutching ([id number]) [id number] Page 10 of 15 Entered By: Dayton Scrape on 04/29/2023 06:05:01 -------------------------------------------------------------------------------- Wound Assessment Details Patient Name: Date of Service: [name] [name]. 04/29/2023 9:00 A M Medical Record Number: [id number] Patient Account Number: [id number] Date of Birth/Sex: Treating RN: 06-Nov-1955 (67 y.o. M) Primary Care Lemmie Steinhaus: [place] [facility] Other Clinician: Referring Laela Deviney: Treating Metzli Pollick/Extender: [name] in Treatment: 18 Wound Status Wound Number: 4 Primary Etiology: Diabetic Wound/Ulcer of the Lower Extremity Wound Location: Left, Distal, Medial Lower Leg Secondary Venous  Leg Ulcer Etiology: Wounding Event: Gradually Appeared Wound Status: Open Date Acquired: 02/11/2023 Comorbid Anemia, Lymphedema, Hypertension, Cirrhosis , Type II Weeks Of Treatment: 11 History: Diabetes Clustered Wound: No Photos Wound Measurements Length: (cm) 0.2 Width: (cm) 0.2 Depth: (cm) 0.1 Area: (cm) 0.031 Volume: (cm) 0.003 % Reduction in Area: 91.9% % Reduction in Volume: 92.1% Epithelialization: Large (67-100%) Wound Description Classification: Grade 1 Wound Margin: Distinct, outline attached Exudate Amount: Medium Exudate Type: Serosanguineous Exudate Color: red, brown Foul Odor After Cleansing: No Slough/Fibrino Yes Wound Bed Granulation Amount: Large (67-100%) Exposed Structure Granulation Quality: Hyper-granulation Fascia Exposed: No Necrotic Amount: Small (1-33%) Fat Layer (Subcutaneous Tissue) Exposed: No Necrotic Quality: Adherent Slough Tendon Exposed: No Muscle Exposed: No Joint Exposed: No Bone Exposed: No Periwound Skin Texture Texture Color No Abnormalities Noted: No No Abnormalities Noted: No Callus: No Atrophie Blanche: No  KEMARION, CRAKER ([id number]) [id number] Page 1 of 15 Visit Report for 04/29/2023 Arrival Information Details Patient Name: Date of Service: [name] [name]. 04/29/2023 9:00 A M Medical Record Number: [id number] Patient Account Number: [id number] Date of Birth/Sex: Treating RN: May 23, 1956 (67 y.o. M) Primary Care Erykah Lippert: [place] [facility] Other Clinician: Referring Jonavin Seder: Treating Mariateresa Batra/Extender: [name] in Treatment: 18 Visit Information History Since Last Visit Added or deleted any medications: No Patient Arrived: Ambulatory Any new allergies or adverse reactions: No Arrival Time: 08:56 Had a fall or experienced change in No Accompanied By: sister activities of daily living that may affect Transfer Assistance: None risk of falls: Patient Identification Verified: Yes Signs or symptoms of abuse/neglect since last visito No Secondary Verification Process Completed: Yes Hospitalized since last visit: No Patient Requires Transmission-Based No Implantable device outside of the clinic excluding No Precautions: cellular tissue based products placed in the center Patient Has Alerts: Yes since last visit: Patient Alerts: 10/23 ABI L0.94 R1 VVS Pain Present Now: No 10/23 TBI L1.2 R0.88 VVS Electronic Signature(s) Signed: 04/29/2023 10:10:27 AM By: Dayton Scrape Entered By: Dayton Scrape on 04/29/2023 05:57:20 -------------------------------------------------------------------------------- Encounter Discharge Information Details Patient Name: Date of Service: [name] [name], [name] 04/29/2023 9:00 A M Medical Record Number: [id number] Patient Account Number: [id number] Date of Birth/Sex: Treating RN: 02-Jun-1956 (67 y.o. [name] Primary Care Sheccid Lahmann: [place] Mon Other Clinician: Referring Kashauna Celmer: Treating Aisa Schoeppner/Extender: [name] in Treatment: 18 Encounter Discharge Information Items Post  Procedure Vitals Discharge Condition: Stable Temperature (F): 97.8 Ambulatory Status: Ambulatory Pulse (bpm): 63 Discharge Destination: Home Respiratory Rate (breaths/min): 18 Transportation: Private Auto Blood Pressure (mmHg): 133/69 Accompanied By: sister Schedule Follow-up Appointment: Yes Clinical Summary of Care: Electronic Signature(s) Signed: 04/29/2023 4:21:40 PM By: [name] RN, BSN Entered By: [name] on 04/29/2023 06:24:26 Cay Schillings ([id number]) [id number] Page 2 of 15 -------------------------------------------------------------------------------- Lower Extremity Assessment Details Patient Name: Date of Service: [name] 04/29/2023 9:00 A M Medical Record Number: [id number] Patient Account Number: [id number] Date of Birth/Sex: Treating RN: 02/09/1956 (67 y.o. [name] Primary Care Lanae Federer: [place] Mon Other Clinician: Referring Anjanae Woehrle: Treating Cyleigh Massaro/Extender: [name] in Treatment: 18 Edema Assessment Assessed: [name]: Yes] [name]: Yes] Edema: [Left: No] [Right: No] Calf Left: Right: Point of Measurement: 37 cm From Medial Instep 33 cm 33 cm Ankle Left: Right: Point of Measurement: 13 cm From Medial Instep 22 cm 21.8 cm Vascular Assessment Pulses: Dorsalis Pedis Palpable: [Left:Yes] [Right:Yes] Extremity colors, hair growth, and conditions: Extremity Color: [Left:Hyperpigmented] [Right:Hyperpigmented] Hair Growth on Extremity: [Left:No] [Right:No] Temperature of Extremity: [Left:Warm] [Right:Warm] Capillary Refill: [Left:< 3 seconds] [Right:< 3 seconds] Dependent Rubor: [Left:No] [Right:No] Blanched when Elevated: [Left:No Yes] [Right:No Yes] Toe Nail Assessment Left: Right: Thick: Yes Yes Discolored: Yes Yes Deformed: Yes Yes Improper Length and Hygiene: No No Electronic Signature(s) Signed: 04/29/2023 4:21:40 PM By: [name] RN, BSN Entered By: [name] on 04/29/2023 06:16:04 -------------------------------------------------------------------------------- Multi Wound Chart Details Patient Name: Date of Service: [name] Dalbert Garnet, [name] 04/29/2023 9:00 A M Medical Record Number: [id number] Patient Account Number: [id number] Date of Birth/Sex: Treating RN: 1956-03-16 (67 y.o. M) Primary Care Macario Shear: [place] Mon Other Clinician: Referring Korban Shearer: Treating Lizandra Zakrzewski/Extender: [name] in Treatment: 18 Vital Signs Height(in): 72 Capillary Blood Glucose(mg/dl): 75 Weight(lbs): 202 Pulse(bpm): [address] M ([id number]) [id number] Page 3 of 15 Body Mass Index(BMI):

## 2023-05-06 ENCOUNTER — Encounter (HOSPITAL_BASED_OUTPATIENT_CLINIC_OR_DEPARTMENT_OTHER): Payer: Medicare Other | Admitting: Internal Medicine

## 2023-05-06 DIAGNOSIS — E11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 other skin ulcer: Secondary | ICD-10-CM | POA: Diagnosis not present

## 2023-05-07 NOTE — Progress Notes (Signed)
leg 2. 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 These lesions have floating black eschar. I have restarted the Santyl under polymen 3. They will dress this daily Electronic Signature(s) Signed: 05/07/2023 7:25:57 AM By: [name] MD Entered By: [name] on 05/06/2023 11:50:49 -------------------------------------------------------------------------------- SuperBill Details Patient Name: Date of Service: [name] RRIS M. 05/06/2023 Medical Record Number: [id number] Patient Account Number: [id number] Date of Birth/Sex: Treating RN: Dec 20, 1955 (67 y.o. 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 Lymphedema, not elsewhere classified E11.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  other skin ulcer K74.69 Other cirrhosis of liver Facility Procedures : CPT4 Code: [id number] Description: 99214 - WOUND CARE VISIT-LEV 4 EST PT Modifier: 25 Quantity: 1 Physician Procedures : CPT4 Code Description Modifier [id number] 99213 - WC PHYS LEVEL 3 - EST PT ICD-10 Diagnosis Description I87.313 Chronic venous hypertension (idiopathic) with ulcer of bilateral lower extremity L97.818 Non-pressure chronic ulcer of other part of right  lower leg with other specified severity L97.828 Non-pressure chronic ulcer of other part of left lower leg with other specified severity Quantity: 1 Electronic Signature(s) Signed: 05/07/2023 7:25:57 AM By: [name] MD Entered By: [name] on 05/06/2023 11:51:18  [name] [name], [name] [name] ([id number]) [id number] Page 1 of 8 Visit Report for 05/06/2023 HPI Details Patient Name: Date of Service: CA [name] Searing. 05/06/2023 10:30 A M Medical Record Number: [id number] Patient Account Number: [id number] Date of Birth/Sex: Treating RN: 1956/02/07 (67 y.o. M) Primary Care Provider: Cranford Mon Other Clinician: Referring Provider: Treating Provider/Extender: [name] in Treatment: 19 History of Present Illness HPI Description: 12/24/2022 [name] [name] is a 67 year old male with a past medical history of controlled type 2 diabetes on insulin, lymphedema/chronic venous insufficiency and cirrhosis that presents to the clinic for a 1 year history of nonhealing wounds to his lower extremities bilaterally. He reports the starting spontaneously. He states that he has been following with [facility] and they have been using [facility] antibiotic spray to the legs. He is not wearing compression stockings or wraps. He is also tried Medihoney and collagen in the past with little benefit. He currently denies systemic signs of infection. 5/30; patient presents for follow-up. He has been using triamcinolone cream to the periwound and Santyl to the wound beds. There has been improvement in wound healing. He denies signs of infection and has no issues or complaints today. 6/4; both legs look considerably better. He has a wound on the right lateral lower leg and the left medial lower leg. Significant hemosiderin in the right leg less so on the left. We have been using Hydrofera Blue under compression 6/13; patient presents for follow-up. We have been using antibiotic ointment with Hydrofera Blue under compression therapy. The wounds are smaller. He has no issues or complaints today. We discussed ordering juxta lite compression garment wraps T use once his wounds heal as he has hard time putting on o compression  stockings. He was agreeable with this. 6/20; patient presents for follow-up. We have been using antibiotic ointment with Hydrofera Blue under compression therapy to the lower extremities bilaterally. Wounds are smaller. He has been approved for PuraPly and was agreeable with placement today. He denies signs of infection. 6/27; patient presents for follow-up. We have been using antibiotic ointment with Hydrofera Blue under compression therapy to the left lower extremity. This wound is healed. He has his juxta lite compression wraps with him today. T the right lateral leg we have been using PuraPly under compression and that wound o is smaller today. Unfortunately he has developed skin breakdown from the Steri-Strip that was used to hold the PuraPly in place and this has created a new wound on the right leg. 7/8; patient presents for follow-up. We have been using PuraPly to the right lateral leg wound Under compression wrap. Wound is slightly smaller however original wound used for PuraPly is healed. He unfortunately developed a new wound to the left lower extremity but it looks like this was from potential scratching. He has been using his juxta lite compression here. 716; patient presents for follow-up. We have been using antibiotic ointment with Hydrofera Blue under 3 layer compression to lower extremities bilaterally. Unfortunately he has continued to develop wounds to the left lower extremity. He currently denies signs of infection. 7/22; patient presents for follow-up. He has been using Santyl and Hydrofera Blue with triamcinolone cream under her juxta lite compression wraps daily. No new wounds today. Current wounds are stable. Less irritation to the periwound. 7/29; patient presents for follow-up. He has been using Santyl and Hydrofera Blue with triamcinolone cream under the juxta lite compression wraps. He is having a hard time  Instructions: May shower and wash wound with dial antibacterial soap and water prior to dressing change. Peri-Wound Care: Triamcinolone 15 (g) Every Other Day/15 Days Discharge Instructions: Use triamcinolone apply to entire both legs. around the wounds. Topical: Santyl Collagenase Ointment, 30 (gm), tube Every Other Day/15 Days Prim Dressing: PolyMem Silver Non-Adhesive Dressing, 4.25x4.25 in Every Other Day/15 Days ary Discharge Instructions: Apply to wound bed as instructed Secondary Dressing: Woven Gauze Sponge, Non-Sterile 4x4 in (Generic) Every Other Day/15 Days Discharge Instructions: Apply over primary dressing as directed. Secured With: [employer], 4.5x3.1 (in/yd) (Generic) Every Other Day/15 Days Discharge Instructions: Secure with Kerlix as directed. Secured With: 80M Medipore H Soft Cloth Surgical T ape, 4 x 10 (in/yd) (Generic) Every Other Day/15 Days Discharge Instructions: Secure with tape as directed. Wound #6 - Lower Leg Wound Laterality: Left, Posterior, Proximal Cleanser: Soap and Water Every Other Day/15 Days Discharge Instructions: May shower and wash wound with dial antibacterial soap and water prior to dressing change. Peri-Wound Care: Triamcinolone 15 (g) Every Other Day/15 Days [name] [name], [name] [name] ([id number]) [phone].pdf Page 4 of 8 Discharge Instructions: Use triamcinolone apply to entire both legs. around the wounds. Topical: Santyl Collagenase Ointment, 30 (gm), tube Every Other Day/15 Days Prim Dressing: PolyMem Silver Non-Adhesive Dressing, 4.25x4.25 in Every Other Day/15 Days ary Discharge Instructions: Apply to wound bed as instructed Secondary Dressing: Woven Gauze Sponge, Non-Sterile 4x4 in (Generic) Every Other Day/15 Days Discharge Instructions: Apply over primary dressing as directed. Secured With: [employer], 4.5x3.1 (in/yd) (Generic) Every Other  Day/15 Days Discharge Instructions: Secure with Kerlix as directed. Secured With: 80M Medipore H Soft Cloth Surgical T ape, 4 x 10 (in/yd) (Generic) Every Other Day/15 Days Discharge Instructions: Secure with tape as directed. Electronic Signature(s) Signed: 05/06/2023 2:03:22 PM By: Brenton Grills Signed: 05/07/2023 7:25:57 AM By: [name] MD Entered By: Brenton Grills on 05/06/2023 10:57:51 -------------------------------------------------------------------------------- Problem List Details Patient Name: Date of Service: [name] RRIS M. 05/06/2023 10:30 A M Medical Record Number: [id number] Patient Account Number: [id number] Date of Birth/Sex: Treating RN: Mar 31, 1956 (67 y.o. 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 05/06/2023  10:30 A M Medical Record Number: [id number] Patient Account Number: [id number] Date of Birth/Sex: Treating RN: 12/31/1955 (67 y.o. M) Primary Care Provider: Cranford Mon Other Clinician: Referring Provider: Treating Provider/Extender: [name] in Treatment: 19 Subjective History of Present Illness (HPI) 12/24/2022 Mr. Zamarian Scarano is a 67 year old male with a past medical history of controlled type 2 diabetes on insulin, lymphedema/chronic venous insufficiency and cirrhosis that presents to the clinic for a  leg 2. 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 These lesions have floating black eschar. I have restarted the Santyl under polymen 3. They will dress this daily Electronic Signature(s) Signed: 05/07/2023 7:25:57 AM By: [name] MD Entered By: [name] on 05/06/2023 11:50:49 -------------------------------------------------------------------------------- SuperBill Details Patient Name: Date of Service: [name] RRIS M. 05/06/2023 Medical Record Number: [id number] Patient Account Number: [id number] Date of Birth/Sex: Treating RN: Dec 20, 1955 (67 y.o. 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 Lymphedema, not elsewhere classified E11.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  other skin ulcer K74.69 Other cirrhosis of liver Facility Procedures : CPT4 Code: 21308657 Description: 99214 - WOUND CARE VISIT-LEV 4 EST PT Modifier: 25 Quantity: 1 Physician Procedures : CPT4 Code Description Modifier [id number] 99213 - WC PHYS LEVEL 3 - EST PT ICD-10 Diagnosis Description I87.313 Chronic venous hypertension (idiopathic) with ulcer of bilateral lower extremity L97.818 Non-pressure chronic ulcer of other part of right  lower leg with other specified severity L97.828 Non-pressure chronic ulcer of other part of left lower leg with other specified severity Quantity: 1 Electronic Signature(s) Signed: 05/07/2023 7:25:57 AM By: [name] MD Entered By: [name] on 05/06/2023 11:51:18  Instructions: May shower and wash wound with dial antibacterial soap and water prior to dressing change. Peri-Wound Care: Triamcinolone 15 (g) Every Other Day/15 Days Discharge Instructions: Use triamcinolone apply to entire both legs. around the wounds. Topical: Santyl Collagenase Ointment, 30 (gm), tube Every Other Day/15 Days Prim Dressing: PolyMem Silver Non-Adhesive Dressing, 4.25x4.25 in Every Other Day/15 Days ary Discharge Instructions: Apply to wound bed as instructed Secondary Dressing: Woven Gauze Sponge, Non-Sterile 4x4 in (Generic) Every Other Day/15 Days Discharge Instructions: Apply over primary dressing as directed. Secured With: [employer], 4.5x3.1 (in/yd) (Generic) Every Other Day/15 Days Discharge Instructions: Secure with Kerlix as directed. Secured With: 80M Medipore H Soft Cloth Surgical T ape, 4 x 10 (in/yd) (Generic) Every Other Day/15 Days Discharge Instructions: Secure with tape as directed. Wound #6 - Lower Leg Wound Laterality: Left, Posterior, Proximal Cleanser: Soap and Water Every Other Day/15 Days Discharge Instructions: May shower and wash wound with dial antibacterial soap and water prior to dressing change. Peri-Wound Care: Triamcinolone 15 (g) Every Other Day/15 Days [name] [name], [name] [name] ([id number]) [phone].pdf Page 4 of 8 Discharge Instructions: Use triamcinolone apply to entire both legs. around the wounds. Topical: Santyl Collagenase Ointment, 30 (gm), tube Every Other Day/15 Days Prim Dressing: PolyMem Silver Non-Adhesive Dressing, 4.25x4.25 in Every Other Day/15 Days ary Discharge Instructions: Apply to wound bed as instructed Secondary Dressing: Woven Gauze Sponge, Non-Sterile 4x4 in (Generic) Every Other Day/15 Days Discharge Instructions: Apply over primary dressing as directed. Secured With: [employer], 4.5x3.1 (in/yd) (Generic) Every Other  Day/15 Days Discharge Instructions: Secure with Kerlix as directed. Secured With: 80M Medipore H Soft Cloth Surgical T ape, 4 x 10 (in/yd) (Generic) Every Other Day/15 Days Discharge Instructions: Secure with tape as directed. Electronic Signature(s) Signed: 05/06/2023 2:03:22 PM By: Brenton Grills Signed: 05/07/2023 7:25:57 AM By: [name] MD Entered By: Brenton Grills on 05/06/2023 10:57:51 -------------------------------------------------------------------------------- Problem List Details Patient Name: Date of Service: [name] RRIS M. 05/06/2023 10:30 A M Medical Record Number: [id number] Patient Account Number: [id number] Date of Birth/Sex: Treating RN: Mar 31, 1956 (67 y.o. 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 05/06/2023  10:30 A M Medical Record Number: [id number] Patient Account Number: [id number] Date of Birth/Sex: Treating RN: 12/31/1955 (67 y.o. M) Primary Care Provider: Cranford Mon Other Clinician: Referring Provider: Treating Provider/Extender: [name] in Treatment: 19 Subjective History of Present Illness (HPI) 12/24/2022 Mr. Zamarian Scarano is a 67 year old male with a past medical history of controlled type 2 diabetes on insulin, lymphedema/chronic venous insufficiency and cirrhosis that presents to the clinic for a  [name] [name], [name] [name] ([id number]) [id number] Page 1 of 8 Visit Report for 05/06/2023 HPI Details Patient Name: Date of Service: CA [name] Searing. 05/06/2023 10:30 A M Medical Record Number: [id number] Patient Account Number: [id number] Date of Birth/Sex: Treating RN: 1956/02/07 (67 y.o. M) Primary Care Provider: Cranford Mon Other Clinician: Referring Provider: Treating Provider/Extender: [name] in Treatment: 19 History of Present Illness HPI Description: 12/24/2022 [name] [name] is a 67 year old male with a past medical history of controlled type 2 diabetes on insulin, lymphedema/chronic venous insufficiency and cirrhosis that presents to the clinic for a 1 year history of nonhealing wounds to his lower extremities bilaterally. He reports the starting spontaneously. He states that he has been following with [facility] and they have been using [facility] antibiotic spray to the legs. He is not wearing compression stockings or wraps. He is also tried Medihoney and collagen in the past with little benefit. He currently denies systemic signs of infection. 5/30; patient presents for follow-up. He has been using triamcinolone cream to the periwound and Santyl to the wound beds. There has been improvement in wound healing. He denies signs of infection and has no issues or complaints today. 6/4; both legs look considerably better. He has a wound on the right lateral lower leg and the left medial lower leg. Significant hemosiderin in the right leg less so on the left. We have been using Hydrofera Blue under compression 6/13; patient presents for follow-up. We have been using antibiotic ointment with Hydrofera Blue under compression therapy. The wounds are smaller. He has no issues or complaints today. We discussed ordering juxta lite compression garment wraps T use once his wounds heal as he has hard time putting on o compression  stockings. He was agreeable with this. 6/20; patient presents for follow-up. We have been using antibiotic ointment with Hydrofera Blue under compression therapy to the lower extremities bilaterally. Wounds are smaller. He has been approved for PuraPly and was agreeable with placement today. He denies signs of infection. 6/27; patient presents for follow-up. We have been using antibiotic ointment with Hydrofera Blue under compression therapy to the left lower extremity. This wound is healed. He has his juxta lite compression wraps with him today. T the right lateral leg we have been using PuraPly under compression and that wound o is smaller today. Unfortunately he has developed skin breakdown from the Steri-Strip that was used to hold the PuraPly in place and this has created a new wound on the right leg. 7/8; patient presents for follow-up. We have been using PuraPly to the right lateral leg wound Under compression wrap. Wound is slightly smaller however original wound used for PuraPly is healed. He unfortunately developed a new wound to the left lower extremity but it looks like this was from potential scratching. He has been using his juxta lite compression here. 716; patient presents for follow-up. We have been using antibiotic ointment with Hydrofera Blue under 3 layer compression to lower extremities bilaterally. Unfortunately he has continued to develop wounds to the left lower extremity. He currently denies signs of infection. 7/22; patient presents for follow-up. He has been using Santyl and Hydrofera Blue with triamcinolone cream under her juxta lite compression wraps daily. No new wounds today. Current wounds are stable. Less irritation to the periwound. 7/29; patient presents for follow-up. He has been using Santyl and Hydrofera Blue with triamcinolone cream under the juxta lite compression wraps. He is having a hard time  [name] [name], [name] [name] ([id number]) [id number] Page 1 of 8 Visit Report for 05/06/2023 HPI Details Patient Name: Date of Service: CA [name] Searing. 05/06/2023 10:30 A M Medical Record Number: [id number] Patient Account Number: [id number] Date of Birth/Sex: Treating RN: 1956/02/07 (67 y.o. M) Primary Care Provider: Cranford Mon Other Clinician: Referring Provider: Treating Provider/Extender: [name] in Treatment: 19 History of Present Illness HPI Description: 12/24/2022 [name] [name] is a 67 year old male with a past medical history of controlled type 2 diabetes on insulin, lymphedema/chronic venous insufficiency and cirrhosis that presents to the clinic for a 1 year history of nonhealing wounds to his lower extremities bilaterally. He reports the starting spontaneously. He states that he has been following with [facility] and they have been using [facility] antibiotic spray to the legs. He is not wearing compression stockings or wraps. He is also tried Medihoney and collagen in the past with little benefit. He currently denies systemic signs of infection. 5/30; patient presents for follow-up. He has been using triamcinolone cream to the periwound and Santyl to the wound beds. There has been improvement in wound healing. He denies signs of infection and has no issues or complaints today. 6/4; both legs look considerably better. He has a wound on the right lateral lower leg and the left medial lower leg. Significant hemosiderin in the right leg less so on the left. We have been using Hydrofera Blue under compression 6/13; patient presents for follow-up. We have been using antibiotic ointment with Hydrofera Blue under compression therapy. The wounds are smaller. He has no issues or complaints today. We discussed ordering juxta lite compression garment wraps T use once his wounds heal as he has hard time putting on o compression  stockings. He was agreeable with this. 6/20; patient presents for follow-up. We have been using antibiotic ointment with Hydrofera Blue under compression therapy to the lower extremities bilaterally. Wounds are smaller. He has been approved for PuraPly and was agreeable with placement today. He denies signs of infection. 6/27; patient presents for follow-up. We have been using antibiotic ointment with Hydrofera Blue under compression therapy to the left lower extremity. This wound is healed. He has his juxta lite compression wraps with him today. T the right lateral leg we have been using PuraPly under compression and that wound o is smaller today. Unfortunately he has developed skin breakdown from the Steri-Strip that was used to hold the PuraPly in place and this has created a new wound on the right leg. 7/8; patient presents for follow-up. We have been using PuraPly to the right lateral leg wound Under compression wrap. Wound is slightly smaller however original wound used for PuraPly is healed. He unfortunately developed a new wound to the left lower extremity but it looks like this was from potential scratching. He has been using his juxta lite compression here. 716; patient presents for follow-up. We have been using antibiotic ointment with Hydrofera Blue under 3 layer compression to lower extremities bilaterally. Unfortunately he has continued to develop wounds to the left lower extremity. He currently denies signs of infection. 7/22; patient presents for follow-up. He has been using Santyl and Hydrofera Blue with triamcinolone cream under her juxta lite compression wraps daily. No new wounds today. Current wounds are stable. Less irritation to the periwound. 7/29; patient presents for follow-up. He has been using Santyl and Hydrofera Blue with triamcinolone cream under the juxta lite compression wraps. He is having a hard time  [name] [name], [name] [name] ([id number]) [id number] Page 1 of 8 Visit Report for 05/06/2023 HPI Details Patient Name: Date of Service: [place] [name] Searing. 05/06/2023 10:30 A M Medical Record Number: [id number] Patient Account Number: [id number] Date of Birth/Sex: Treating RN: 1956/02/07 (67 y.o. M) Primary Care Provider: Cranford Mon Other Clinician: Referring Provider: Treating Provider/Extender: [name] in Treatment: 19 History of Present Illness HPI Description: 12/24/2022 [name] [name] is a 67 year old male with a past medical history of controlled type 2 diabetes on insulin, lymphedema/chronic venous insufficiency and cirrhosis that presents to the clinic for a 1 year history of nonhealing wounds to his lower extremities bilaterally. He reports the starting spontaneously. He states that he has been following with [facility] and they have been using [facility] antibiotic spray to the legs. He is not wearing compression stockings or wraps. He is also tried Medihoney and collagen in the past with little benefit. He currently denies systemic signs of infection. 5/30; patient presents for follow-up. He has been using triamcinolone cream to the periwound and Santyl to the wound beds. There has been improvement in wound healing. He denies signs of infection and has no issues or complaints today. 6/4; both legs look considerably better. He has a wound on the right lateral lower leg and the left medial lower leg. Significant hemosiderin in the right leg less so on the left. We have been using Hydrofera Blue under compression 6/13; patient presents for follow-up. We have been using antibiotic ointment with Hydrofera Blue under compression therapy. The wounds are smaller. He has no issues or complaints today. We discussed ordering juxta lite compression garment wraps T use once his wounds heal as he has hard time putting on o compression  stockings. He was agreeable with this. 6/20; patient presents for follow-up. We have been using antibiotic ointment with Hydrofera Blue under compression therapy to the lower extremities bilaterally. Wounds are smaller. He has been approved for PuraPly and was agreeable with placement today. He denies signs of infection. 6/27; patient presents for follow-up. We have been using antibiotic ointment with Hydrofera Blue under compression therapy to the left lower extremity. This wound is healed. He has his juxta lite compression wraps with him today. T the right lateral leg we have been using PuraPly under compression and that wound o is smaller today. Unfortunately he has developed skin breakdown from the Steri-Strip that was used to hold the PuraPly in place and this has created a new wound on the right leg. 7/8; patient presents for follow-up. We have been using PuraPly to the right lateral leg wound Under compression wrap. Wound is slightly smaller however original wound used for PuraPly is healed. He unfortunately developed a new wound to the left lower extremity but it looks like this was from potential scratching. He has been using his juxta lite compression here. 716; patient presents for follow-up. We have been using antibiotic ointment with Hydrofera Blue under 3 layer compression to lower extremities bilaterally. Unfortunately he has continued to develop wounds to the left lower extremity. He currently denies signs of infection. 7/22; patient presents for follow-up. He has been using Santyl and Hydrofera Blue with triamcinolone cream under her juxta lite compression wraps daily. No new wounds today. Current wounds are stable. Less irritation to the periwound. 7/29; patient presents for follow-up. He has been using Santyl and Hydrofera Blue with triamcinolone cream under the juxta lite compression wraps. He is having a hard time  leg 2. 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 These lesions have floating black eschar. I have restarted the Santyl under polymen 3. They will dress this daily Electronic Signature(s) Signed: 05/07/2023 7:25:57 AM By: [name] MD Entered By: [name] on 05/06/2023 11:50:49 -------------------------------------------------------------------------------- SuperBill Details Patient Name: Date of Service: [name] RRIS M. 05/06/2023 Medical Record Number: [id number] Patient Account Number: [id number] Date of Birth/Sex: Treating RN: Dec 20, 1955 (67 y.o. 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 Lymphedema, not elsewhere classified E11.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  other skin ulcer K74.69 Other cirrhosis of liver Facility Procedures : CPT4 Code: [id number] Description: 99214 - WOUND CARE VISIT-LEV 4 EST PT Modifier: 25 Quantity: 1 Physician Procedures : CPT4 Code Description Modifier [id number] - WC PHYS LEVEL 3 - EST PT ICD-10 Diagnosis Description I87.313 Chronic venous hypertension (idiopathic) with ulcer of bilateral lower extremity L97.818 Non-pressure chronic ulcer of other part of right  lower leg with other specified severity L97.828 Non-pressure chronic ulcer of other part of left lower leg with other specified severity Quantity: 1 Electronic Signature(s) Signed: 05/07/2023 7:25:57 AM By: [name] MD Entered By: [name] on 05/06/2023 11:51:18

## 2023-05-07 NOTE — Progress Notes (Signed)
Additional assistance / Altered mentation []  - 0 Support Surface(s) Assessment (bed, cushion, seat, etc.) INTERVENTIONS - Wound Cleansing / Measurement []  - 0 Simple Wound Cleansing - one wound X- 4 5 Complex Wound Cleansing - multiple wounds X- 1 5 Wound Imaging (photographs - any number of wounds) []  - 0 Wound Tracing (instead of photographs) []  - 0 Simple Wound Measurement - one wound []  - 0 Complex Wound Measurement - multiple wounds INTERVENTIONS - Wound Dressings X - Small Wound Dressing one or multiple wounds 4 10 []  - 0 Medium Wound Dressing one or multiple wounds []  - 0 Large Wound Dressing one or multiple wounds []  - 0 Application of Medications - topical []  - 0 Application of Medications - injection INTERVENTIONS - Miscellaneous []  - 0 External ear exam []  - 0 Specimen Collection (cultures, biopsies, blood, body fluids, etc.) []  - 0 Specimen(s) / Culture(s) sent or taken to Lab for analysis []  - 0 Patient Transfer (multiple staff / Nurse, adult / Similar devices) []  - 0 Simple Staple / Suture removal (25 or less) []  - 0 Complex Staple / Suture removal (26 or more) []  - 0 Hypo / Hyperglycemic Management (close monitor of Blood Glucose) JAHRELL, HAMOR ([id number]) [id number] Page 3 of 14 []  - 0 Ankle / Brachial Index (ABI) - do not check if billed separately X- 1 5 Vital Signs Has the patient been seen at the hospital within the last three years: Yes Total Score: 140 Level Of Care: New/Established - Level 4 Electronic Signature(s) Signed: 05/06/2023 2:03:22 PM By: Brenton Grills Entered By: Brenton Grills on 05/06/2023 07:59:08 -------------------------------------------------------------------------------- Encounter Discharge Information Details Patient Name:  Date of Service: [name] 05/06/2023 10:30 A M Medical Record Number: [id number] Patient Account Number: [id number] Date of Birth/Sex: Treating RN: [birth year] (67 y.o. [name] Primary Care Blakelyn Dinges: [place] Mon Other Clinician: Referring Dayan Desa: Treating Mylo Driskill/Extender: [name] in Treatment: 19 Encounter Discharge Information Items Discharge Condition: Stable Ambulatory Status: Ambulatory Discharge Destination: Home Transportation: Private Auto Accompanied By: spouse Schedule Follow-up Appointment: Yes Clinical Summary of Care: Patient Declined Electronic Signature(s) Signed: 05/06/2023 2:03:22 PM By: Brenton Grills Entered By: Brenton Grills on 05/06/2023 10:59:22 -------------------------------------------------------------------------------- Lower Extremity Assessment Details Patient Name: Date of Service: [name] 05/06/2023 10:30 A M Medical Record Number: [id number] Patient Account Number: [id number] Date of Birth/Sex: Treating RN: Feb 03, 1956 (67 y.o. [name] Primary Care Magda Muise: [place] Mon Other Clinician: Referring Ridgely Anastacio: Treating Jakeisha Stricker/Extender: [name] in Treatment: 19 Edema Assessment Assessed: [name]: No] [name]: No] Edema: [Left: No] [Right: No] Calf Left: Right: Point of Measurement: 37 cm From Medial Instep 34.8 cm 34 cm Ankle Left: Right: Point of Measurement: 13 cm From Medial Instep 22 cm 23 cm Vascular Assessment Left: [id number] Page 4 of 14Right:] Pulses: Dorsalis Pedis Palpable: [id number] Page 4 of 14Yes Yes] Extremity colors, hair growth, and conditions: Extremity Color: [id number].pdf Page 4 of 14Hyperpigmented Hyperpigmented] Hair Growth on Extremity: [[id number] Page 4 of 14No No] Temperature of Extremity:  [[id number] Page 4 of 14Warm Warm] Capillary Refill: [id number] Page 4 of 14< 3 seconds < 3 seconds] Dependent Rubor: [[id number].pdf Page 4 of 14No No Yes Yes] Toe Nail Assessment Left: Right: Thick: Yes Yes Discolored: Yes Yes Deformed: Yes Yes Improper Length and Hygiene: Yes Yes Electronic Signature(s) Signed: 05/06/2023 2:03:22 PM By: Brenton Grills Entered By: Brenton Grills on 05/06/2023 07:31:37 -------------------------------------------------------------------------------- Multi Wound Chart Details Patient Name: Date  of Service: [name] 05/06/2023 10:30 A M Medical Record Number: [id number] Patient Account Number: [id number] Date of Birth/Sex: Treating RN: 06-May-1956 (67 y.o. M) Primary Care Jerek Meulemans: [place] Mon Other Clinician: Referring Jakita Dutkiewicz: Treating Canisha Issac/Extender: [name] in Treatment: 19 Vital Signs Height(in): 72 Capillary Blood Glucose(mg/dl): 97 Weight(lbs): 086 Pulse(bpm): 56 Body Mass Index(BMI): 33.2 Blood Pressure(mmHg): 156/69 Temperature(F): 97.7 Respiratory Rate(breaths/min): 18 [3:Photos:] Right, Proximal, Anterior Lower Leg Left, Distal, Medial Lower Leg Right, Distal, Lateral Lower Leg Wound Location: Gradually Appeared Gradually Appeared Blister Wounding Event: Diabetic Wound/Ulcer of the Lower Diabetic Wound/Ulcer of the Lower Diabetic Wound/Ulcer of the Lower Primary Etiology: Extremity Extremity Extremity N/A Venous Leg Ulcer N/A Secondary Etiology: Anemia, Lymphedema, Hypertension, Anemia, Lymphedema, Hypertension, Anemia, Lymphedema, Hypertension, Comorbid History: Cirrhosis , Type II Diabetes Cirrhosis , Type II Diabetes Cirrhosis , Type II Diabetes 01/25/2023 02/11/2023 02/19/2023 Date Acquired: 13 12 10  Weeks of Treatment: Open Open Open Wound Status: No No No Wound Recurrence: No No Yes Clustered Wound: 3  N/A 1 Clustered Quantity: 1x0.6x0.1 0.1x0.2x0.1 0.7x0.5x0.1 Measurements L x W x D (cm) 0.471 0.016 0.275 A (cm) : rea 0.047 0.002 0.027 Volume (cm) : -563.40% 95.80% 56.80% % Reduction in Area: -235.70% 94.70% 57.80% % Reduction in VolumeMONTEY, EBEL ([id number]) [id number] Page 5 of 14 Grade 1 Grade 1 Grade 1 Classification: Medium Medium Medium Exudate A mount: Serosanguineous Serosanguineous Serosanguineous Exudate Type: red, brown red, brown red, brown Exudate Color: Distinct, outline attached Distinct, outline attached Distinct, outline attached Wound Margin: None Present (0%) Large (67-100%) Medium (34-66%) Granulation A mount: N/A Hyper-granulation Red, Pink Granulation Quality: Large (67-100%) Small (1-33%) Medium (34-66%) Necrotic A mount: Fat Layer (Subcutaneous Tissue): Yes Fascia: No Fat Layer (Subcutaneous Tissue): Yes Exposed Structures: Fascia: No Fat Layer (Subcutaneous Tissue): No Fascia: No Tendon: No Tendon: No Tendon: No Muscle: No Muscle: No Muscle: No Joint: No Joint: No Joint: No Bone: No Bone: No Bone: No Small (1-33%) Large (67-100%) Small (1-33%) Epithelialization: Excoriation: No Excoriation: No Excoriation: No Periwound Skin Texture: Induration: No Induration: No Induration: No Callus: No Callus: No Callus: No Crepitus: No Crepitus: No Crepitus: No Rash: No Rash: No Rash: No Scarring: No Scarring: No Scarring: No Maceration: No Maceration: No Maceration: No Periwound Skin Moisture: Dry/Scaly: No Dry/Scaly: No Dry/Scaly: No Atrophie Blanche: No Atrophie Blanche: No Atrophie Blanche: No Periwound Skin Color: Cyanosis: No Cyanosis: No Cyanosis: No Ecchymosis: No Ecchymosis: No Ecchymosis: No Erythema: No Erythema: No Erythema: No Hemosiderin Staining: No Hemosiderin Staining: No Hemosiderin Staining: No Mottled: No Mottled: No Mottled: No Pallor: No Pallor:  No Pallor: No Rubor: No Rubor: No Rubor: No No Abnormality No Abnormality No Abnormality Temperature: Wound Number: 6 N/A N/A Photos: N/A N/A Left, Proximal, Posterior Lower Leg N/A N/A Wound Location: Blister N/A N/A Wounding Event: Diabetic Wound/Ulcer of the Lower N/A N/A Primary Etiology: Extremity N/A N/A N/A Secondary Etiology: Anemia, Lymphedema, Hypertension, N/A N/A Comorbid History: Cirrhosis , Type II Diabetes 02/19/2023 N/A N/A Date Acquired: 10 N/A N/A Weeks of Treatment: Open N/A N/A Wound Status: No N/A N/A Wound Recurrence: No N/A N/A Clustered Wound: N/A N/A N/A Clustered Quantity: 0.4x0.4x0.1 N/A N/A Measurements L x W x D (cm) 0.126 N/A N/A A (cm) : rea 0.013 N/A N/A Volume (cm) : 92.40% N/A N/A % Reduction in A rea: 92.10% N/A N/A % Reduction in Volume: Grade 1 N/A N/A Classification: Medium N/A N/A Exudate A mount: Serosanguineous N/A N/A Exudate Type: red, brown  JAYLEN, KNOPE ([id number]) [id number] Page 1 of 14 Visit Report for 05/06/2023 Arrival Information Details Patient Name: Date of Service: [name]. 05/06/2023 10:30 A M Medical Record Number: [id number] Patient Account Number: [id number] Date of Birth/Sex: Treating RN: 01-25-1956 (67 y.o. [name] Primary Care Lota Leamer: [place] Mon Other Clinician: Referring Danean Marner: Treating Cornelio Parkerson/Extender: [name] in Treatment: 19 Visit Information History Since Last Visit All ordered tests and consults were completed: Yes Patient Arrived: Ambulatory Added or deleted any medications: No Arrival Time: 10:23 Any new allergies or adverse reactions: No Accompanied By: spouse Had a fall or experienced change in No Transfer Assistance: None activities of daily living that may affect Patient Identification Verified: Yes risk of falls: Secondary Verification Process Completed: Yes Signs or symptoms of abuse/neglect since last visito No Patient Requires Transmission-Based No Hospitalized since last visit: No Precautions: Implantable device outside of the clinic excluding No Patient Has Alerts: Yes cellular tissue based products placed in the center Patient Alerts: 10/23 ABI L0.94 R1 VVS since last visit: 10/23 TBI L1.2 R0.88 VVS Has Dressing in Place as Prescribed: Yes Pain Present Now: No Electronic Signature(s) Signed: 05/06/2023 2:03:22 PM By: Brenton Grills Entered By: Brenton Grills on 05/06/2023 07:25:48 -------------------------------------------------------------------------------- Clinic Level of Care Assessment Details Patient Name: Date of Service: CA 41, [name] RRIS M. 05/06/2023 10:30 A M Medical Record Number: [id number] Patient Account Number: [id number] Date of Birth/Sex: Treating RN: 04/12/1956 (67 y.o. [name] Primary Care Tashiya Souders: [place] Mon Other Clinician: Referring Maeleigh Buschman: Treating  Dezi Brauner/Extender: [name] in Treatment: 19 Clinic Level of Care Assessment Items TOOL 4 Quantity Score X- 1 0 Use when only an EandM is performed on FOLLOW-UP visit ASSESSMENTS - Nursing Assessment / Reassessment X- 1 10 Reassessment of Co-morbidities (includes updates in patient status) X- 1 5 Reassessment of Adherence to Treatment Plan ASSESSMENTS - Wound and Skin A ssessment / Reassessment X - Simple Wound Assessment / Reassessment - one wound 1 5 X- 1 5 Complex Wound Assessment / Reassessment - multiple wounds X- 1 10 Dermatologic / Skin Assessment (not related to wound area) ASSESSMENTS - Focused Assessment []  - 0 Circumferential Edema Measurements - multi extremities []  - 0 Nutritional Assessment / Counseling / Intervention ERCEL, PEPITONE ([id number]) [id number] Page 2 of 14 []  - 0 Lower Extremity Assessment (monofilament, tuning fork, pulses) []  - 0 Peripheral Arterial Disease Assessment (using hand held doppler) ASSESSMENTS - Ostomy and/or Continence Assessment and Care []  - 0 Incontinence Assessment and Management []  - 0 Ostomy Care Assessment and Management (repouching, etc.) PROCESS - Coordination of Care X - Simple Patient / Family Education for ongoing care 1 15 []  - 0 Complex (extensive) Patient / Family Education for ongoing care X- 1 10 Staff obtains Chiropractor, Records, T Results / Process Orders est []  - 0 Staff telephones HHA, Nursing Homes / Clarify orders / etc []  - 0 Routine Transfer to another Facility (non-emergent condition) []  - 0 Routine Hospital Admission (non-emergent condition) []  - 0 New Admissions / Manufacturing engineer / Ordering NPWT Apligraf, etc. , []  - 0 Emergency Hospital Admission (emergent condition) X- 1 10 Simple Discharge Coordination []  - 0 Complex (extensive) Discharge Coordination PROCESS - Special Needs []  - 0 Pediatric / Minor Patient Management []  -  0 Isolation Patient Management []  - 0 Hearing / Language / Visual special needs []  - 0 Assessment of Community assistance (transportation, D/C planning, etc.) []  - 0  Number: [id number] Date of Birth/Sex: Treating RN: 1956-05-30 (67 y.o. [name] Primary Care Hazleigh Mccleave: [place] Mon Other Clinician: Referring Edwin Baines: Treating Shuayb Schepers/Extender: [name] in Treatment: [address] EMMETTE, KATT ([id number]) [id number] Page 13 of 14 Wound Number: 6 Primary Diabetic Wound/Ulcer of the Lower Extremity Etiology: Wound Location: Left, Proximal, Posterior Lower Leg Wound Status: Open Wounding Event: Blister Comorbid Anemia, Lymphedema, Hypertension, Cirrhosis , Type II Date Acquired: 02/19/2023 History: Diabetes Weeks Of Treatment: 10 Clustered Wound: No Photos Wound Measurements Length: (cm) 0.4 Width: (cm) 0.4 Depth: (cm) 0.1 Area: (cm) 0.126 Volume: (cm) 0.013 %  Reduction in Area: 92.4% % Reduction in Volume: 92.1% Epithelialization: Small (1-33%) Wound Description Classification: Grade 1 Wound Margin: Distinct, outline attached Exudate Amount: Medium Exudate Type: Serosanguineous Exudate Color: red, brown Foul Odor After Cleansing: No Slough/Fibrino Yes Wound Bed Granulation Amount: Small (1-33%) Exposed Structure Granulation Quality: Pink Fascia Exposed: No Necrotic Amount: Large (67-100%) Fat Layer (Subcutaneous Tissue) Exposed: Yes Necrotic Quality: Adherent Slough Tendon Exposed: No Muscle Exposed: No Joint Exposed: No Bone Exposed: No Periwound Skin Texture Texture Color No Abnormalities Noted: No No Abnormalities Noted: No Callus: No Atrophie Blanche: No Crepitus: No Cyanosis: No Excoriation: No Ecchymosis: No Induration: No Erythema: No Rash: No Hemosiderin Staining: No Scarring: No Mottled: No Pallor: No Moisture Rubor: No No Abnormalities Noted: No Dry / Scaly: No Temperature / Pain Maceration: No Temperature: No Abnormality Treatment Notes Wound #6 (Lower Leg) Wound Laterality: Left, Posterior, Proximal Cleanser Soap and Water Discharge Instruction: May shower and wash wound with dial antibacterial soap and water prior to dressing change. Peri-Wound Care Triamcinolone 15 (g) Discharge Instruction: Use triamcinolone apply to entire both legs. around the wounds. Topical Santyl Collagenase Ointment, 30 (gm), tube [address] JAHLANI, LORENTZ ([id number]) [id number] Page 14 of 14 PolyMem Silver Non-Adhesive Dressing, 4.25x4.25 in Discharge Instruction: Apply to wound bed as instructed Secondary Dressing Woven Gauze Sponge, Non-Sterile 4x4 in Discharge Instruction: Apply over primary dressing as directed. Secured With [employer], 4.5x3.1 (in/yd) Discharge Instruction: Secure with Kerlix as directed. 42M Medipore H Soft Cloth Surgical T ape, 4 x 10 (in/yd) Discharge  Instruction: Secure with tape as directed. Compression Wrap Compression Stockings Add-Ons Electronic Signature(s) Signed: 05/06/2023 2:03:22 PM By: Brenton Grills Entered By: Brenton Grills on 05/06/2023 07:39:33 -------------------------------------------------------------------------------- Vitals Details Patient Name: Date of Service: [name], [name] 05/06/2023 10:30 A M Medical Record Number: [id number] Patient Account Number: [id number] Date of Birth/Sex: Treating RN: 11-18-1955 (67 y.o. [name] Primary Care Audrionna Lampton: [place] Mon Other Clinician: Referring Lauraann Missey: Treating Martrice Apt/Extender: [name] in Treatment: 19 Vital Signs Time Taken: 10:25 Temperature (F): 97.7 Height (in): 72 Pulse (bpm): 56 Weight (lbs): 245 Respiratory Rate (breaths/min): 18 Body Mass Index (BMI): 33.2 Blood Pressure (mmHg): 156/69 Capillary Blood Glucose (mg/dl): 97 Reference Range: 80 - 120 mg / dl Electronic Signature(s) Signed: 05/06/2023 2:03:22 PM By: Brenton Grills Entered By: Brenton Grills on 05/06/2023 62:95:28  JAYLEN, KNOPE ([id number]) [id number] Page 1 of 14 Visit Report for 05/06/2023 Arrival Information Details Patient Name: Date of Service: [name]. 05/06/2023 10:30 A M Medical Record Number: [id number] Patient Account Number: [id number] Date of Birth/Sex: Treating RN: 01-25-1956 (67 y.o. [name] Primary Care Lota Leamer: [place] Mon Other Clinician: Referring Danean Marner: Treating Cornelio Parkerson/Extender: [name] in Treatment: 19 Visit Information History Since Last Visit All ordered tests and consults were completed: Yes Patient Arrived: Ambulatory Added or deleted any medications: No Arrival Time: 10:23 Any new allergies or adverse reactions: No Accompanied By: spouse Had a fall or experienced change in No Transfer Assistance: None activities of daily living that may affect Patient Identification Verified: Yes risk of falls: Secondary Verification Process Completed: Yes Signs or symptoms of abuse/neglect since last visito No Patient Requires Transmission-Based No Hospitalized since last visit: No Precautions: Implantable device outside of the clinic excluding No Patient Has Alerts: Yes cellular tissue based products placed in the center Patient Alerts: 10/23 ABI L0.94 R1 VVS since last visit: 10/23 TBI L1.2 R0.88 VVS Has Dressing in Place as Prescribed: Yes Pain Present Now: No Electronic Signature(s) Signed: 05/06/2023 2:03:22 PM By: Brenton Grills Entered By: Brenton Grills on 05/06/2023 07:25:48 -------------------------------------------------------------------------------- Clinic Level of Care Assessment Details Patient Name: Date of Service: CA 41, [name] RRIS M. 05/06/2023 10:30 A M Medical Record Number: [id number] Patient Account Number: [id number] Date of Birth/Sex: Treating RN: 04/12/1956 (67 y.o. [name] Primary Care Tashiya Souders: [place] Mon Other Clinician: Referring Maeleigh Buschman: Treating  Dezi Brauner/Extender: [name] in Treatment: 19 Clinic Level of Care Assessment Items TOOL 4 Quantity Score X- 1 0 Use when only an EandM is performed on FOLLOW-UP visit ASSESSMENTS - Nursing Assessment / Reassessment X- 1 10 Reassessment of Co-morbidities (includes updates in patient status) X- 1 5 Reassessment of Adherence to Treatment Plan ASSESSMENTS - Wound and Skin A ssessment / Reassessment X - Simple Wound Assessment / Reassessment - one wound 1 5 X- 1 5 Complex Wound Assessment / Reassessment - multiple wounds X- 1 10 Dermatologic / Skin Assessment (not related to wound area) ASSESSMENTS - Focused Assessment []  - 0 Circumferential Edema Measurements - multi extremities []  - 0 Nutritional Assessment / Counseling / Intervention ERCEL, PEPITONE ([id number]) [id number] Page 2 of 14 []  - 0 Lower Extremity Assessment (monofilament, tuning fork, pulses) []  - 0 Peripheral Arterial Disease Assessment (using hand held doppler) ASSESSMENTS - Ostomy and/or Continence Assessment and Care []  - 0 Incontinence Assessment and Management []  - 0 Ostomy Care Assessment and Management (repouching, etc.) PROCESS - Coordination of Care X - Simple Patient / Family Education for ongoing care 1 15 []  - 0 Complex (extensive) Patient / Family Education for ongoing care X- 1 10 Staff obtains Chiropractor, Records, T Results / Process Orders est []  - 0 Staff telephones HHA, Nursing Homes / Clarify orders / etc []  - 0 Routine Transfer to another Facility (non-emergent condition) []  - 0 Routine Hospital Admission (non-emergent condition) []  - 0 New Admissions / Manufacturing engineer / Ordering NPWT Apligraf, etc. , []  - 0 Emergency Hospital Admission (emergent condition) X- 1 10 Simple Discharge Coordination []  - 0 Complex (extensive) Discharge Coordination PROCESS - Special Needs []  - 0 Pediatric / Minor Patient Management []  -  0 Isolation Patient Management []  - 0 Hearing / Language / Visual special needs []  - 0 Assessment of Community assistance (transportation, D/C planning, etc.) []  - 0  No Temperature: No Abnormality Treatment Notes Wound #4 (Lower Leg) Wound Laterality: Left, Medial, Distal Cleanser Soap and Water Discharge Instruction: May shower and wash wound with dial antibacterial soap and water prior to dressing change. Peri-Wound Care Triamcinolone 15 (g) Discharge Instruction: Use triamcinolone apply to entire both legs. around the wounds. Topical Santyl Collagenase Ointment, 30 (gm), tube Primary Dressing PolyMem Silver Non-Adhesive Dressing, 4.25x4.25 in Discharge Instruction: Apply to wound bed as instructed Secondary Dressing Woven Gauze Sponge, Non-Sterile 4x4 in Discharge Instruction: Apply over primary dressing as directed. Secured With ELIZAH, MIERZWA ([id number]) [id number] Page 11 of 14 Kerlix Roll Sterile, 4.5x3.1 (in/yd) Discharge Instruction: Secure with Kerlix as directed. 44M Medipore H Soft Cloth Surgical T ape, 4 x 10 (in/yd) Discharge Instruction: Secure with tape as directed. Compression Wrap Compression Stockings Add-Ons Electronic Signature(s) Signed: 05/06/2023 2:03:22 PM By: Brenton Grills Entered By: Brenton Grills on 05/06/2023 07:37:59 -------------------------------------------------------------------------------- Wound Assessment Details Patient Name: Date of Service: [name] 05/06/2023 10:30 A M Medical Record Number: [id number] Patient Account Number: [id number] Date of Birth/Sex: Treating  RN: 16-Feb-1956 (67 y.o. [name] Primary Care Jenny Lai: [place] Mon Other Clinician: Referring Dennisha Mouser: Treating Lesta Limbert/Extender: [name] in Treatment: 19 Wound Status Wound Number: 5 Primary Diabetic Wound/Ulcer of the Lower Extremity Etiology: Wound Location: Right, Distal, Lateral Lower Leg Wound Status: Open Wounding Event: Blister Comorbid Anemia, Lymphedema, Hypertension, Cirrhosis , Type II Date Acquired: 02/19/2023 History: Diabetes Weeks Of Treatment: 10 Clustered Wound: Yes Photos Wound Measurements Length: (cm) 0.7 Width: (cm) 0.5 Depth: (cm) 0.1 Clustered Quantity: 1 Area: (cm) 0.27 Volume: (cm) 0.02 % Reduction in Area: 56.8% % Reduction in Volume: 57.8% Epithelialization: Small (1-33%) 5 7 Wound Description Classification: Grade 1 Wound Margin: Distinct, outline attached Exudate Amount: Medium Exudate Type: Serosanguineous Exudate Color: red, brown Foul Odor After Cleansing: No Slough/Fibrino Yes Wound Bed Granulation Amount: Medium (34-66%) Exposed Structure Granulation Quality: Red, Pink Fascia Exposed: No Necrotic Amount: Medium (34-66%) Fat Layer (Subcutaneous Tissue) Exposed: Yes Necrotic Quality: Adherent Slough Tendon Exposed: No NARESH, ALTHAUS ([id number]) [id number] Page 12 of 14 Muscle Exposed: No Joint Exposed: No Bone Exposed: No Periwound Skin Texture Texture Color No Abnormalities Noted: No No Abnormalities Noted: No Callus: No Atrophie Blanche: No Crepitus: No Cyanosis: No Excoriation: No Ecchymosis: No Induration: No Erythema: No Rash: No Hemosiderin Staining: No Scarring: No Mottled: No Pallor: No Moisture Rubor: No No Abnormalities Noted: No Dry / Scaly: No Temperature / Pain Maceration: No Temperature: No Abnormality Treatment Notes Wound #5 (Lower Leg) Wound Laterality: Right, Lateral, Distal Cleanser Soap and Water Discharge Instruction:  May shower and wash wound with dial antibacterial soap and water prior to dressing change. Peri-Wound Care Triamcinolone 15 (g) Discharge Instruction: Use triamcinolone apply to entire both legs. around the wounds. Topical Santyl Collagenase Ointment, 30 (gm), tube Primary Dressing PolyMem Silver Non-Adhesive Dressing, 4.25x4.25 in Discharge Instruction: Apply to wound bed as instructed Secondary Dressing Woven Gauze Sponge, Non-Sterile 4x4 in Discharge Instruction: Apply over primary dressing as directed. Secured With [employer], 4.5x3.1 (in/yd) Discharge Instruction: Secure with Kerlix as directed. 44M Medipore H Soft Cloth Surgical T ape, 4 x 10 (in/yd) Discharge Instruction: Secure with tape as directed. Compression Wrap Compression Stockings Add-Ons Electronic Signature(s) Signed: 05/06/2023 2:03:22 PM By: Brenton Grills Entered By: Brenton Grills on 05/06/2023 07:38:51 -------------------------------------------------------------------------------- Wound Assessment Details Patient Name: Date of Service: [name] 05/06/2023 10:30 A M Medical Record Number: [id number] Patient Account  Number: [id number] Date of Birth/Sex: Treating RN: 1956-05-30 (67 y.o. [name] Primary Care Hazleigh Mccleave: [place] Mon Other Clinician: Referring Edwin Baines: Treating Shuayb Schepers/Extender: [name] in Treatment: [address] EMMETTE, KATT ([id number]) [id number] Page 13 of 14 Wound Number: 6 Primary Diabetic Wound/Ulcer of the Lower Extremity Etiology: Wound Location: Left, Proximal, Posterior Lower Leg Wound Status: Open Wounding Event: Blister Comorbid Anemia, Lymphedema, Hypertension, Cirrhosis , Type II Date Acquired: 02/19/2023 History: Diabetes Weeks Of Treatment: 10 Clustered Wound: No Photos Wound Measurements Length: (cm) 0.4 Width: (cm) 0.4 Depth: (cm) 0.1 Area: (cm) 0.126 Volume: (cm) 0.013 %  Reduction in Area: 92.4% % Reduction in Volume: 92.1% Epithelialization: Small (1-33%) Wound Description Classification: Grade 1 Wound Margin: Distinct, outline attached Exudate Amount: Medium Exudate Type: Serosanguineous Exudate Color: red, brown Foul Odor After Cleansing: No Slough/Fibrino Yes Wound Bed Granulation Amount: Small (1-33%) Exposed Structure Granulation Quality: Pink Fascia Exposed: No Necrotic Amount: Large (67-100%) Fat Layer (Subcutaneous Tissue) Exposed: Yes Necrotic Quality: Adherent Slough Tendon Exposed: No Muscle Exposed: No Joint Exposed: No Bone Exposed: No Periwound Skin Texture Texture Color No Abnormalities Noted: No No Abnormalities Noted: No Callus: No Atrophie Blanche: No Crepitus: No Cyanosis: No Excoriation: No Ecchymosis: No Induration: No Erythema: No Rash: No Hemosiderin Staining: No Scarring: No Mottled: No Pallor: No Moisture Rubor: No No Abnormalities Noted: No Dry / Scaly: No Temperature / Pain Maceration: No Temperature: No Abnormality Treatment Notes Wound #6 (Lower Leg) Wound Laterality: Left, Posterior, Proximal Cleanser Soap and Water Discharge Instruction: May shower and wash wound with dial antibacterial soap and water prior to dressing change. Peri-Wound Care Triamcinolone 15 (g) Discharge Instruction: Use triamcinolone apply to entire both legs. around the wounds. Topical Santyl Collagenase Ointment, 30 (gm), tube [address] JAHLANI, LORENTZ ([id number]) [id number] Page 14 of 14 PolyMem Silver Non-Adhesive Dressing, 4.25x4.25 in Discharge Instruction: Apply to wound bed as instructed Secondary Dressing Woven Gauze Sponge, Non-Sterile 4x4 in Discharge Instruction: Apply over primary dressing as directed. Secured With [employer], 4.5x3.1 (in/yd) Discharge Instruction: Secure with Kerlix as directed. 42M Medipore H Soft Cloth Surgical T ape, 4 x 10 (in/yd) Discharge  Instruction: Secure with tape as directed. Compression Wrap Compression Stockings Add-Ons Electronic Signature(s) Signed: 05/06/2023 2:03:22 PM By: Brenton Grills Entered By: Brenton Grills on 05/06/2023 07:39:33 -------------------------------------------------------------------------------- Vitals Details Patient Name: Date of Service: [name], [name] 05/06/2023 10:30 A M Medical Record Number: [id number] Patient Account Number: [id number] Date of Birth/Sex: Treating RN: 11-18-1955 (67 y.o. [name] Primary Care Audrionna Lampton: [place] Mon Other Clinician: Referring Lauraann Missey: Treating Martrice Apt/Extender: [name] in Treatment: 19 Vital Signs Time Taken: 10:25 Temperature (F): 97.7 Height (in): 72 Pulse (bpm): 56 Weight (lbs): 245 Respiratory Rate (breaths/min): 18 Body Mass Index (BMI): 33.2 Blood Pressure (mmHg): 156/69 Capillary Blood Glucose (mg/dl): 97 Reference Range: 80 - 120 mg / dl Electronic Signature(s) Signed: 05/06/2023 2:03:22 PM By: Brenton Grills Entered By: Brenton Grills on 05/06/2023 62:95:28

## 2023-05-09 ENCOUNTER — Encounter: Payer: Self-pay | Admitting: Internal Medicine

## 2023-05-13 ENCOUNTER — Encounter (HOSPITAL_BASED_OUTPATIENT_CLINIC_OR_DEPARTMENT_OTHER): Payer: Medicare Other | Attending: Internal Medicine | Admitting: Internal Medicine

## 2023-05-13 DIAGNOSIS — L97818 Non-pressure chronic ulcer of other part of right lower leg with other specified severity: Secondary | ICD-10-CM | POA: Diagnosis not present

## 2023-05-13 DIAGNOSIS — E11621 Type 2 diabetes mellitus with foot ulcer: Secondary | ICD-10-CM | POA: Diagnosis not present

## 2023-05-13 DIAGNOSIS — K7460 Unspecified cirrhosis of liver: Secondary | ICD-10-CM | POA: Diagnosis not present

## 2023-05-13 DIAGNOSIS — I87313 Chronic venous hypertension (idiopathic) with ulcer of bilateral lower extremity: Secondary | ICD-10-CM | POA: Insufficient documentation

## 2023-05-13 DIAGNOSIS — D649 Anemia, unspecified: Secondary | ICD-10-CM | POA: Diagnosis not present

## 2023-05-13 DIAGNOSIS — E11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 other skin ulcer: Secondary | ICD-10-CM | POA: Diagnosis not present

## 2023-05-13 DIAGNOSIS — I890 Lymphedema, not elsewhere classified: Secondary | ICD-10-CM | POA: Diagnosis not present

## 2023-05-13 DIAGNOSIS — L950 Livedoid vasculitis: Secondary | ICD-10-CM | POA: Insufficient documentation

## 2023-05-13 DIAGNOSIS — I872 Venous insufficiency (chronic) (peripheral): Secondary | ICD-10-CM | POA: Insufficient documentation

## 2023-05-13 DIAGNOSIS — Z794 Long term (current) use of insulin: Secondary | ICD-10-CM | POA: Insufficient documentation

## 2023-05-13 DIAGNOSIS — I10 Essential (primary) hypertension: Secondary | ICD-10-CM | POA: Diagnosis not present

## 2023-05-13 DIAGNOSIS — L97828 Non-pressure chronic ulcer of other part of left lower leg with other specified severity: Secondary | ICD-10-CM | POA: Insufficient documentation

## 2023-05-14 NOTE — Progress Notes (Signed)
Rubor: No No Abnormalities Noted: No Dry / Scaly: No Temperature / Pain Maceration: No Temperature: No Abnormality BOBBI, KOZAKIEWICZ ([id number]) [id number] Page 11 of 13 Treatment Notes Wound #5 (Lower Leg) Wound Laterality: Right, Lateral, Distal Cleanser Soap and Water Discharge Instruction: May shower and wash wound with dial antibacterial soap and water prior to dressing change. Peri-Wound Care Triamcinolone 15 (g) Discharge Instruction: Use triamcinolone apply to entire both legs. around the wounds. Topical Primary Dressing Hydrofera Blue Ready Transfer Foam, 2.5x2.5 (in/in) Discharge Instruction: Apply directly to wound bed as directed Santyl Ointment Discharge Instruction: Apply nickel thick amount to wound bed as instructed Secondary Dressing Woven Gauze Sponge, Non-Sterile 4x4 in Discharge Instruction: Apply over primary dressing as  directed. Secured With [employer], 4.5x3.1 (in/yd) Discharge Instruction: Secure with Kerlix as directed. 35M Medipore H Soft Cloth Surgical T ape, 4 x 10 (in/yd) Discharge Instruction: Secure with tape as directed. Compression Wrap Compression Stockings Add-Ons Electronic Signature(s) Signed: 05/13/2023 5:17:24 PM By: [name] RN, BSN Entered By: [name] on 05/13/2023 05:54:04 -------------------------------------------------------------------------------- Wound Assessment Details Patient Name: Date of Service: [name], [name] 05/13/2023 8:45 A M Medical Record Number: [id number] Patient Account Number: [id number] Date of Birth/Sex: Treating RN: 01-Apr-1956 (67 y.o. [name] Primary Care Saidee Geremia: Cranford Mon Other Clinician: Referring Ferrell Claiborne: Treating Willow Reczek/Extender: [name] in Treatment: 20 Wound Status Wound Number: 6 Primary Diabetic Wound/Ulcer of the Lower Extremity Etiology: Wound Location: Left, Proximal, Posterior Lower Leg Wound Status: Open Wounding Event: Blister Comorbid Anemia, Lymphedema, Hypertension, Cirrhosis , Type II Date Acquired: 02/19/2023 History: Diabetes Weeks Of Treatment: 11 Clustered Wound: No Photos ALANTE, TOLAN ([id number]) [phone].pdf Page 12 of 13 Wound Measurements Length: (cm) 0.9 Width: (cm) 0.6 Depth: (cm) 0.1 Area: (cm) 0.424 Volume: (cm) 0.042 % Reduction in Area: 74.3% % Reduction in Volume: 74.5% Epithelialization: Small (1-33%) Tunneling: No Undermining: No Wound Description Classification: Grade 1 Wound Margin: Distinct, outline attached Exudate Amount: Medium Exudate Type: Serosanguineous Exudate Color: red, brown Foul Odor After Cleansing: No Slough/Fibrino Yes Wound Bed Granulation Amount: None Present (0%) Exposed Structure Necrotic Amount: Large (67-100%) Fascia Exposed: No Necrotic Quality: Adherent Slough Fat Layer  (Subcutaneous Tissue) Exposed: Yes Tendon Exposed: No Muscle Exposed: No Joint Exposed: No Bone Exposed: No Periwound Skin Texture Texture Color No Abnormalities Noted: No No Abnormalities Noted: No Callus: No Atrophie Blanche: No Crepitus: No Cyanosis: No Excoriation: No Ecchymosis: No Induration: No Erythema: No Rash: No Hemosiderin Staining: No Scarring: No Mottled: No Pallor: No Moisture Rubor: No No Abnormalities Noted: No Dry / Scaly: No Temperature / Pain Maceration: No Temperature: No Abnormality Treatment Notes Wound #6 (Lower Leg) Wound Laterality: Left, Posterior, Proximal Cleanser Soap and Water Discharge Instruction: May shower and wash wound with dial antibacterial soap and water prior to dressing change. Peri-Wound Care Triamcinolone 15 (g) Discharge Instruction: Use triamcinolone apply to entire both legs. around the wounds. Topical Primary Dressing Hydrofera Blue Ready Transfer Foam, 2.5x2.5 (in/in) Discharge Instruction: Apply directly to wound bed as directed Santyl Ointment Discharge Instruction: Apply nickel thick amount to wound bed as instructed Secondary Dressing Woven Gauze Sponge, Non-Sterile 4x4 in Discharge Instruction: Apply over primary dressing as directed. CHADD, TOLLISON ([id number]) [id number] Page 13 of 13 Secured With [employer], 4.5x3.1 (in/yd) Discharge Instruction: Secure with Kerlix as directed. 35M Medipore H Soft Cloth Surgical T ape, 4 x 10 (in/yd) Discharge Instruction: Secure with tape as directed. Compression Wrap Compression Stockings Add-Ons Electronic  Cirrhosis , Type II Weeks Of Treatment: 13 History: Diabetes Clustered Wound: No Photos Wound Measurements Length: (cm) 1 Width: (cm) 0.8 Depth: (cm) 0.1 Area: (cm) 0.628 JOAOVICTOR, KRONE M ([id number]) Volume: (cm) 0.063 % Reduction in Area: -63.1% % Reduction in Volume: -65.8% Epithelialization: Small (1-33%) Tunneling: No [id number] Page 9 of 13 Undermining: No Wound Description Classification: Grade 1 Wound Margin: Distinct, outline attached Exudate Amount: Medium Exudate Type: Serosanguineous Exudate Color: red, brown Foul Odor After Cleansing: No Slough/Fibrino Yes Wound Bed Granulation Amount: Small (1-33%) Exposed Structure Granulation Quality: Hyper-granulation Fascia Exposed: No Necrotic Amount: Large (67-100%) Fat Layer (Subcutaneous Tissue) Exposed: Yes Necrotic Quality: Adherent Slough Tendon Exposed: No Muscle Exposed: No Joint Exposed: No Bone Exposed: No Periwound Skin Texture Texture Color No Abnormalities Noted: No No Abnormalities Noted: No Callus: No Atrophie Blanche: No Crepitus: No Cyanosis: No Excoriation: No Ecchymosis: No Induration: No Erythema: No Rash: No Hemosiderin Staining: No Scarring: No Mottled: No Pallor: No Moisture Rubor: No No Abnormalities Noted: No Dry / Scaly: No Temperature / Pain Maceration: No Temperature: No Abnormality Treatment Notes Wound #4 (Lower Leg) Wound Laterality: Left, Medial, Distal Cleanser Soap and Water Discharge Instruction: May  shower and wash wound with dial antibacterial soap and water prior to dressing change. Peri-Wound Care Triamcinolone 15 (g) Discharge Instruction: Use triamcinolone apply to entire both legs. around the wounds. Topical Primary Dressing Hydrofera Blue Ready Transfer Foam, 2.5x2.5 (in/in) Discharge Instruction: Apply directly to wound bed as directed Santyl Ointment Discharge Instruction: Apply nickel thick amount to wound bed as instructed Secondary Dressing Woven Gauze Sponge, Non-Sterile 4x4 in Discharge Instruction: Apply over primary dressing as directed. Secured With [employer], 4.5x3.1 (in/yd) Discharge Instruction: Secure with Kerlix as directed. 77M Medipore H Soft Cloth Surgical T ape, 4 x 10 (in/yd) Discharge Instruction: Secure with tape as directed. Compression Wrap Compression Stockings Add-Ons Electronic Signature(s) Signed: 05/13/2023 5:17:24 PM By: [name] RN, BSN Entered By: [name] on 05/13/2023 05:56:15 Cay Schillings ([id number]) [id number] Page 10 of 13 -------------------------------------------------------------------------------- Wound Assessment Details Patient Name: Date of Service: [name] 05/13/2023 8:45 A M Medical Record Number: [id number] Patient Account Number: [id number] Date of Birth/Sex: Treating RN: Aug 22, 1955 (67 y.o. [name], [name] Primary Care Ralphael Southgate: Cranford Mon Other Clinician: Referring Cali Hope: Treating Shanie Mauzy/Extender: [name] in Treatment: 20 Wound Status Wound Number: 5 Primary Diabetic Wound/Ulcer of the Lower Extremity Etiology: Wound Location: Right, Distal, Lateral Lower Leg Wound Status: Open Wounding Event: Blister Comorbid Anemia, Lymphedema, Hypertension, Cirrhosis , Type II Date Acquired: 02/19/2023 History: Diabetes Weeks Of Treatment: 11 Clustered Wound: Yes Photos Wound Measurements Length: (cm) Width: (cm) Depth:  (cm) Clustered Quantity: Area: (cm) Volume: (cm) 1.1 % Reduction in Area: 18.6% 0.6 % Reduction in Volume: 18.8% 0.1 Epithelialization: Small (1-33%) 1 Tunneling: No 0.518 Undermining: No 0.052 Wound Description Classification: Grade 1 Wound Margin: Distinct, outline attached Exudate Amount: Medium Exudate Type: Serosanguineous Exudate Color: red, brown Foul Odor After Cleansing: No Slough/Fibrino Yes Wound Bed Granulation Amount: Large (67-100%) Exposed Structure Granulation Quality: Red, Pink Fascia Exposed: No Necrotic Amount: Small (1-33%) Fat Layer (Subcutaneous Tissue) Exposed: Yes Necrotic Quality: Adherent Slough Tendon Exposed: No Muscle Exposed: No Joint Exposed: No Bone Exposed: No Periwound Skin Texture Texture Color No Abnormalities Noted: No No Abnormalities Noted: No Callus: No Atrophie Blanche: No Crepitus: No Cyanosis: No Excoriation: No Ecchymosis: No Induration: No Erythema: No Rash: No Hemosiderin Staining: No Scarring: No Mottled: No Pallor: No Moisture  Cirrhosis , Type II Weeks Of Treatment: 13 History: Diabetes Clustered Wound: No Photos Wound Measurements Length: (cm) 1 Width: (cm) 0.8 Depth: (cm) 0.1 Area: (cm) 0.628 JOAOVICTOR, KRONE M ([id number]) Volume: (cm) 0.063 % Reduction in Area: -63.1% % Reduction in Volume: -65.8% Epithelialization: Small (1-33%) Tunneling: No [id number] Page 9 of 13 Undermining: No Wound Description Classification: Grade 1 Wound Margin: Distinct, outline attached Exudate Amount: Medium Exudate Type: Serosanguineous Exudate Color: red, brown Foul Odor After Cleansing: No Slough/Fibrino Yes Wound Bed Granulation Amount: Small (1-33%) Exposed Structure Granulation Quality: Hyper-granulation Fascia Exposed: No Necrotic Amount: Large (67-100%) Fat Layer (Subcutaneous Tissue) Exposed: Yes Necrotic Quality: Adherent Slough Tendon Exposed: No Muscle Exposed: No Joint Exposed: No Bone Exposed: No Periwound Skin Texture Texture Color No Abnormalities Noted: No No Abnormalities Noted: No Callus: No Atrophie Blanche: No Crepitus: No Cyanosis: No Excoriation: No Ecchymosis: No Induration: No Erythema: No Rash: No Hemosiderin Staining: No Scarring: No Mottled: No Pallor: No Moisture Rubor: No No Abnormalities Noted: No Dry / Scaly: No Temperature / Pain Maceration: No Temperature: No Abnormality Treatment Notes Wound #4 (Lower Leg) Wound Laterality: Left, Medial, Distal Cleanser Soap and Water Discharge Instruction: May  shower and wash wound with dial antibacterial soap and water prior to dressing change. Peri-Wound Care Triamcinolone 15 (g) Discharge Instruction: Use triamcinolone apply to entire both legs. around the wounds. Topical Primary Dressing Hydrofera Blue Ready Transfer Foam, 2.5x2.5 (in/in) Discharge Instruction: Apply directly to wound bed as directed Santyl Ointment Discharge Instruction: Apply nickel thick amount to wound bed as instructed Secondary Dressing Woven Gauze Sponge, Non-Sterile 4x4 in Discharge Instruction: Apply over primary dressing as directed. Secured With [employer], 4.5x3.1 (in/yd) Discharge Instruction: Secure with Kerlix as directed. 77M Medipore H Soft Cloth Surgical T ape, 4 x 10 (in/yd) Discharge Instruction: Secure with tape as directed. Compression Wrap Compression Stockings Add-Ons Electronic Signature(s) Signed: 05/13/2023 5:17:24 PM By: [name] RN, BSN Entered By: [name] on 05/13/2023 05:56:15 Cay Schillings ([id number]) [id number] Page 10 of 13 -------------------------------------------------------------------------------- Wound Assessment Details Patient Name: Date of Service: [name] 05/13/2023 8:45 A M Medical Record Number: [id number] Patient Account Number: [id number] Date of Birth/Sex: Treating RN: Aug 22, 1955 (67 y.o. [name], [name] Primary Care Ralphael Southgate: Cranford Mon Other Clinician: Referring Cali Hope: Treating Shanie Mauzy/Extender: [name] in Treatment: 20 Wound Status Wound Number: 5 Primary Diabetic Wound/Ulcer of the Lower Extremity Etiology: Wound Location: Right, Distal, Lateral Lower Leg Wound Status: Open Wounding Event: Blister Comorbid Anemia, Lymphedema, Hypertension, Cirrhosis , Type II Date Acquired: 02/19/2023 History: Diabetes Weeks Of Treatment: 11 Clustered Wound: Yes Photos Wound Measurements Length: (cm) Width: (cm) Depth:  (cm) Clustered Quantity: Area: (cm) Volume: (cm) 1.1 % Reduction in Area: 18.6% 0.6 % Reduction in Volume: 18.8% 0.1 Epithelialization: Small (1-33%) 1 Tunneling: No 0.518 Undermining: No 0.052 Wound Description Classification: Grade 1 Wound Margin: Distinct, outline attached Exudate Amount: Medium Exudate Type: Serosanguineous Exudate Color: red, brown Foul Odor After Cleansing: No Slough/Fibrino Yes Wound Bed Granulation Amount: Large (67-100%) Exposed Structure Granulation Quality: Red, Pink Fascia Exposed: No Necrotic Amount: Small (1-33%) Fat Layer (Subcutaneous Tissue) Exposed: Yes Necrotic Quality: Adherent Slough Tendon Exposed: No Muscle Exposed: No Joint Exposed: No Bone Exposed: No Periwound Skin Texture Texture Color No Abnormalities Noted: No No Abnormalities Noted: No Callus: No Atrophie Blanche: No Crepitus: No Cyanosis: No Excoriation: No Ecchymosis: No Induration: No Erythema: No Rash: No Hemosiderin Staining: No Scarring: No Mottled: No Pallor: No Moisture  Signature(s) Signed: 05/13/2023 5:17:24 PM By: [name] RN, BSN Entered By: [name] on 05/13/2023 05:54:47 -------------------------------------------------------------------------------- Vitals Details Patient Name: Date of Service: [name], [name] 05/13/2023 8:45 A M Medical Record Number: [id number] Patient  Account Number: [id number] Date of Birth/Sex: Treating RN: 02-25-1956 (67 y.o. [name], [name] Primary Care Nasira Janusz: Cranford Mon Other Clinician: Referring Shaunda Tipping: Treating Edmund Rick/Extender: [name] in Treatment: 20 Vital Signs Time Taken: 08:46 Temperature (F): 98.2 Height (in): 72 Pulse (bpm): 54 Weight (lbs): 245 Respiratory Rate (breaths/min): 20 Body Mass Index (BMI): 33.2 Blood Pressure (mmHg): 154/74 Capillary Blood Glucose (mg/dl): 84 Reference Range: 80 - 120 mg / dl Electronic Signature(s) Signed: 05/13/2023 5:17:24 PM By: [name] RN, BSN Entered By: [name] on 05/13/2023 05:52:02  Signature(s) Signed: 05/13/2023 5:17:24 PM By: [name] RN, BSN Entered By: [name] on 05/13/2023 05:54:47 -------------------------------------------------------------------------------- Vitals Details Patient Name: Date of Service: [name], [name] 05/13/2023 8:45 A M Medical Record Number: [id number] Patient  Account Number: [id number] Date of Birth/Sex: Treating RN: 02-25-1956 (67 y.o. [name], [name] Primary Care Nasira Janusz: Cranford Mon Other Clinician: Referring Shaunda Tipping: Treating Edmund Rick/Extender: [name] in Treatment: 20 Vital Signs Time Taken: 08:46 Temperature (F): 98.2 Height (in): 72 Pulse (bpm): 54 Weight (lbs): 245 Respiratory Rate (breaths/min): 20 Body Mass Index (BMI): 33.2 Blood Pressure (mmHg): 154/74 Capillary Blood Glucose (mg/dl): 84 Reference Range: 80 - 120 mg / dl Electronic Signature(s) Signed: 05/13/2023 5:17:24 PM By: [name] RN, BSN Entered By: [name] on 05/13/2023 05:52:02  VQQVZ Primary Care Physician: Cranford Mon Other Clinician: Referring Physician: Treating Physician/Extender: [name] in Treatment: 20 Education Assessment Education Provided To: Patient Education Topics  Provided Wound/Skin Impairment: Handouts: Caring for Your Ulcer Methods: Explain/Verbal Responses: Reinforcements needed Electronic Signature(s) Signed: 05/13/2023 5:17:24 PM By: [name] RN, BSN Entered By: [name] on 05/13/2023 05:56:21 -------------------------------------------------------------------------------- Wound Assessment Details Patient Name: Date of Service: [name] 05/13/2023 8:45 A M Medical Record Number: [id number] Patient Account Number: [id number] Date of Birth/Sex: Treating RN: 12-23-1955 (67 y.o. [name] Primary Care Kosisochukwu Burningham: Cranford Mon Other Clinician: BRUCE, CHURILLA ([id number]) [id number] Page 7 of 13 Referring Yakira Duquette: Treating Jocob Dambach/Extender: [name] in Treatment: 20 Wound Status Wound Number: 3 Primary Diabetic Wound/Ulcer of the Lower Extremity Etiology: Wound Location: Right, Proximal, Anterior Lower Leg Wound Status: Open Wounding Event: Gradually Appeared Comorbid Anemia, Lymphedema, Hypertension, Cirrhosis , Type II Date Acquired: 01/25/2023 History: Diabetes Weeks Of Treatment: 14 Clustered Wound: No Photos Wound Measurements Length: (cm) Width: (cm) Depth: (cm) Clustered Quantity: Area: (cm) Volume: (cm) 1 % Reduction in Area: -784.5% 0.8 % Reduction in Volume: -350% 0.1 Epithelialization: Small (1-33%) 3 Tunneling: No 0.628 Undermining: No 0.063 Wound Description Classification: Grade 1 Wound Margin: Distinct, outline attached Exudate Amount: Medium Exudate Type: Serosanguineous Exudate Color: red, brown Foul Odor After Cleansing: No Slough/Fibrino Yes Wound Bed Granulation Amount: None Present (0%) Exposed Structure Necrotic Amount: Large (67-100%) Fascia Exposed: No Necrotic Quality: Adherent Slough Fat Layer (Subcutaneous Tissue) Exposed: Yes Tendon Exposed: No Muscle Exposed: No Joint Exposed: No Bone Exposed: No Periwound  Skin Texture Texture Color No Abnormalities Noted: No No Abnormalities Noted: No Callus: No Atrophie Blanche: No Crepitus: No Cyanosis: No Excoriation: No Ecchymosis: No Induration: No Erythema: No Rash: No Hemosiderin Staining: No Scarring: No Mottled: No Pallor: No Moisture Rubor: No No Abnormalities Noted: No Dry / Scaly: No Temperature / Pain Maceration: No Temperature: No Abnormality Treatment Notes Wound #3 (Lower Leg) Wound Laterality: Right, Anterior, Proximal Cleanser Soap and Water Discharge Instruction: May shower and wash wound with dial antibacterial soap and water prior to dressing change. Peri-Wound Care Triamcinolone 15 (g) DAMARIEN, NYMAN ([id number]) [phone].pdf Page 8 of 13 Discharge Instruction: Use triamcinolone apply to entire both legs. around the wounds. Topical Primary Dressing Hydrofera Blue Ready Transfer Foam, 2.5x2.5 (in/in) Discharge Instruction: Apply directly to wound bed as directed Santyl Ointment Discharge Instruction: Apply nickel thick amount to wound bed as instructed Secondary Dressing Woven Gauze Sponge, Non-Sterile 4x4 in Discharge Instruction: Apply over primary dressing as directed. Secured With [employer], 4.5x3.1 (in/yd) Discharge Instruction: Secure with Kerlix as directed. 80M Medipore H Soft Cloth Surgical T ape, 4 x 10 (in/yd) Discharge Instruction: Secure with tape as directed. Compression Wrap Compression Stockings Add-Ons Electronic Signature(s) Signed: 05/13/2023 5:17:24 PM By: [name] RN, BSN Entered By: [name] on 05/13/2023 05:55:16 -------------------------------------------------------------------------------- Wound Assessment Details Patient Name: Date of Service: [name], [name] 05/13/2023 8:45 A M Medical Record Number: [id number] Patient Account Number: [id number] Date of Birth/Sex: Treating RN: 12-30-1955 (67 y.o. [name] Primary Care  Mckoy Bhakta: Cranford Mon Other Clinician: Referring Shuntay Everetts: Treating Walterine Amodei/Extender: [name] in Treatment: 20 Wound Status Wound Number: 4 Primary Etiology: Diabetic Wound/Ulcer of the Lower Extremity Wound Location: Left, Distal, Medial Lower Leg Secondary Venous Leg Ulcer Etiology: Wounding Event: Gradually Appeared Wound Status: Open Date Acquired: 02/11/2023 Comorbid Anemia, Lymphedema, Hypertension,  Recurrence: No N/A N/A Clustered Wound: N/A N/A N/A Clustered Quantity: 0.9x0.6x0.1 N/A N/A Measurements L x W x D (cm) 0.424 N/A N/A A (cm) : rea 0.042 N/A N/A Volume (cm) : 74.30% N/A N/A % Reduction in A rea: 74.50% N/A N/A % Reduction in Volume: Grade 1 N/A N/A Classification: Medium N/A N/A Exudate A mount: Serosanguineous N/A N/A Exudate Type: red, brown N/A N/A Exudate Color: Distinct, outline attached N/A N/A Wound Margin: None Present (0%) N/A N/A Granulation A mount: N/A N/A N/A Granulation Quality: Large (67-100%) N/A N/A Necrotic A mount: Fat Layer (Subcutaneous Tissue): Yes N/A N/A Exposed Structures: Fascia: No Tendon: No Muscle: No Joint: No Bone: No Small (1-33%) N/A N/A Epithelialization: Chemical/Enzymatic/Mechanical N/A  N/A Debridement: N/A N/A N/A Pain Control: N/A N/A N/A Tissue Debrided: N/A N/A N/A Level: N/A N/A N/A Debridement A (sq cm): rea N/A N/A N/A Instrument: None N/A N/A Bleeding: N/A N/A N/A Hemostasis A chieved: N/A N/A N/A Procedural Pain: N/A N/A N/A Post Procedural Pain: Debridement Treatment Response: Procedure was tolerated well N/A N/A Post Debridement Measurements L x 0.9x0.6x0.1 N/A N/A W x D (cm) 0.042 N/A N/A Post Debridement Volume: (cm) Excoriation: No N/A N/A Periwound Skin Texture: Induration: No Callus: No Crepitus: No Rash: No Scarring: No Maceration: No N/A N/A Periwound Skin Moisture: Dry/Scaly: No Atrophie Blanche: No N/A N/A Periwound Skin Color: Cyanosis: No Ecchymosis: No Erythema: No Hemosiderin Staining: No Mottled: No Pallor: No Rubor: No No Abnormality N/A N/A Temperature: Debridement N/A N/A Procedures Performed: Treatment Notes Electronic Signature(s) Signed: 05/13/2023 5:32:55 PM By: [name] MD Entered By: [name] on 05/13/2023 06:13:01 Cay Schillings ([id number]) [id number] Page 5 of 13 -------------------------------------------------------------------------------- Multi-Disciplinary Care Plan Details Patient Name: Date of Service: [name] 05/13/2023 8:45 A M Medical Record Number: [id number] Patient Account Number: [id number] Date of Birth/Sex: Treating RN: [birth year] (67 y.o. [name] Primary Care Arthea Nobel: Cranford Mon Other Clinician: Referring Hessie Varone: Treating Alyn Riedinger/Extender: [name] in Treatment: 20 Active Inactive Pain, Acute or Chronic Nursing Diagnoses: Pain, acute or chronic: actual or potential Potential alteration in comfort, pain Goals: Patient will verbalize adequate pain control and receive pain control interventions during procedures as needed Date Initiated: 12/24/2022 Target Resolution Date:  06/28/2023 Goal Status: Active Patient/caregiver will verbalize comfort level met Date Initiated: 12/24/2022 Date Inactivated: 01/31/2023 Target Resolution Date: 02/14/2023 Goal Status: Met Interventions: Encourage patient to take pain medications as prescribed Provide education on pain management Treatment Activities: Administer pain control measures as ordered : 12/24/2022 Notes: Electronic Signature(s) Signed: 05/13/2023 5:17:24 PM By: [name] RN, BSN Entered By: [name] on 05/13/2023 05:56:03 -------------------------------------------------------------------------------- Pain Assessment Details Patient Name: Date of Service: [name] 05/13/2023 8:45 A M Medical Record Number: [id number] Patient Account Number: [id number] Date of Birth/Sex: Treating RN: 1956-05-06 (67 y.o. [name] Primary Care Dariyah Garduno: Cranford Mon Other Clinician: Referring Maanasa Aderhold: Treating Shatisha Falter/Extender: [name] in Treatment: 20 Active Problems Location of Pain Severity and Description of Pain Patient Has Paino No Site Locations [place], [place] ([id number]) [id number] Page 6 of 13 Pain Management and Medication Current Pain Management: Electronic Signature(s) Signed: 05/13/2023 5:17:24 PM By: [name] RN, BSN Entered By: [name] on 05/13/2023 05:52:06 -------------------------------------------------------------------------------- Patient/Caregiver Education Details Patient Name: Date of Service: [name] 10/7/2024andnbsp8:45 A M Medical Record Number: [id number] Patient Account Number: [id number] Date of Birth/Gender: Treating RN: 1956-03-28 (67 y.o. M) Elesa Hacker,

## 2023-05-14 NOTE — Progress Notes (Signed)
[name] 05/13/2023 Medical Record Number: [id number] Patient Account Number: [id number] Date of Birth/Sex: Treating RN: Jun 05, 1956 (67 y.o. 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 Lymphedema, not elsewhere classified E11.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 other skin ulcer K74.69 Other cirrhosis of liver Facility Procedures : CPT4 Code: [id number] Description: 97597 - DEBRIDE WOUND 1ST 20 SQ CM OR < ICD-10 Diagnosis Description L97.818 Non-pressure chronic ulcer of other part of right lower leg with other specified s Modifier: everity Quantity: 1 : CPT4 Code: [id number] Description: 78469 - DEBRIDE W/O ANES NON SELECT Modifier: 59 Quantity: 1 Physician Procedures : CPT4 Code Description Modifier [id number] 97597 - WC PHYS DEBR WO ANESTH 20 SQ CM ICD-10 Diagnosis Description L97.818 Non-pressure chronic ulcer of other part of right lower leg with other specified severity Quantity: 1 Electronic Signature(s) Signed: 05/13/2023 5:32:55 PM By: [name] MD [place], Signed: 05/13/2023 5:32:55 PM By: [name] MD Milus Height ([id number]) [id number] Page 12 of 12 Entered By: [name] on 05/13/2023 06:18:31  in/in) 1 x Per Day/30 Days ary Discharge Instructions: Apply directly to wound bed as directed Prim Dressing: Santyl Ointment 1 x Per Day/30 Days ary Discharge Instructions: Apply nickel thick amount to wound bed as instructed Secondary Dressing: Woven Gauze Sponge, Non-Sterile 4x4 in (DME) (Generic) 1 x Per Day/30 Days Discharge Instructions: Apply over primary dressing as directed. Secured With: [employer], 4.5x3.1 (in/yd) (Generic) 1 x Per Day/30 Days Discharge Instructions: Secure with Kerlix as directed. JAIMIN, KRUPKA ([id number]) [id number] Page 7 of 12 Secured With: 41M Medipore H Soft Cloth Surgical T ape, 4 x 10 (in/yd) (DME) (Generic) 1 x Per Day/30 Days Discharge Instructions: Secure with tape as directed. Electronic Signature(s) Signed: 05/13/2023 5:17:24 PM By:  [name] RN, BSN Signed: 05/13/2023 5:32:55 PM By: [name] MD Entered By: [name] on 05/13/2023 06:11:38 -------------------------------------------------------------------------------- Problem List Details Patient Name: Date of Service: [name] 05/13/2023 8:45 A M Medical Record Number: [id number] Patient Account Number: [id number] Date of Birth/Sex: Treating RN: 1955/10/30 (67 y.o. 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 05/13/2023 8:45 A M Medical Record Number: [id number] Patient Account Number: [id number] Date of Birth/Sex: Treating RN: 1955-09-26 (67 y.o. M) Primary Care Provider: Cranford Mon Other Clinician: Referring Provider: Treating Provider/Extender: [name] in  Treatment: 20 Subjective History of Present Illness (HPI) 12/24/2022 Mr. Decklan Mau is a 67 year old male with a past medical history of controlled type 2 diabetes on insulin, lymphedema/chronic venous insufficiency and cirrhosis that presents to the clinic for a 1 year history of nonhealing wounds to his lower extremities bilaterally. He reports the starting spontaneously. He states that he has been following with [facility] and they have been using [facility] antibiotic spray to the legs. He is not wearing compression stockings or wraps. He is also tried Medihoney and collagen in the past with little benefit. He currently denies systemic signs of infection. 5/30; patient presents for follow-up. He has been using triamcinolone cream to the periwound and Santyl to the wound beds. There has been improvement in wound healing. He denies signs of infection and has no issues or complaints today. 6/4; both legs look considerably better. He has a wound on the right lateral lower leg and the left medial lower leg. Significant hemosiderin in the right leg less so on the left. We have been using Hydrofera Blue under compression 6/13; patient presents for follow-up. We have been using antibiotic ointment with Hydrofera Blue under compression therapy. The wounds are smaller. He has no issues or complaints today. We discussed ordering juxta  in/in) 1 x Per Day/30 Days ary Discharge Instructions: Apply directly to wound bed as directed Prim Dressing: Santyl Ointment 1 x Per Day/30 Days ary Discharge Instructions: Apply nickel thick amount to wound bed as instructed Secondary Dressing: Woven Gauze Sponge, Non-Sterile 4x4 in (DME) (Generic) 1 x Per Day/30 Days Discharge Instructions: Apply over primary dressing as directed. Secured With: [employer], 4.5x3.1 (in/yd) (Generic) 1 x Per Day/30 Days Discharge Instructions: Secure with Kerlix as directed. JAIMIN, KRUPKA ([id number]) [id number] Page 7 of 12 Secured With: 41M Medipore H Soft Cloth Surgical T ape, 4 x 10 (in/yd) (DME) (Generic) 1 x Per Day/30 Days Discharge Instructions: Secure with tape as directed. Electronic Signature(s) Signed: 05/13/2023 5:17:24 PM By:  [name] RN, BSN Signed: 05/13/2023 5:32:55 PM By: [name] MD Entered By: [name] on 05/13/2023 06:11:38 -------------------------------------------------------------------------------- Problem List Details Patient Name: Date of Service: [name] 05/13/2023 8:45 A M Medical Record Number: [id number] Patient Account Number: [id number] Date of Birth/Sex: Treating RN: 1955/10/30 (67 y.o. 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 05/13/2023 8:45 A M Medical Record Number: [id number] Patient Account Number: [id number] Date of Birth/Sex: Treating RN: 1955-09-26 (67 y.o. M) Primary Care Provider: Cranford Mon Other Clinician: Referring Provider: Treating Provider/Extender: [name] in  Treatment: 20 Subjective History of Present Illness (HPI) 12/24/2022 Mr. Decklan Mau is a 67 year old male with a past medical history of controlled type 2 diabetes on insulin, lymphedema/chronic venous insufficiency and cirrhosis that presents to the clinic for a 1 year history of nonhealing wounds to his lower extremities bilaterally. He reports the starting spontaneously. He states that he has been following with [facility] and they have been using [facility] antibiotic spray to the legs. He is not wearing compression stockings or wraps. He is also tried Medihoney and collagen in the past with little benefit. He currently denies systemic signs of infection. 5/30; patient presents for follow-up. He has been using triamcinolone cream to the periwound and Santyl to the wound beds. There has been improvement in wound healing. He denies signs of infection and has no issues or complaints today. 6/4; both legs look considerably better. He has a wound on the right lateral lower leg and the left medial lower leg. Significant hemosiderin in the right leg less so on the left. We have been using Hydrofera Blue under compression 6/13; patient presents for follow-up. We have been using antibiotic ointment with Hydrofera Blue under compression therapy. The wounds are smaller. He has no issues or complaints today. We discussed ordering juxta  Post Debridement: Improved Severity of Tissue Post Debridement: Fat layer exposed Post Procedure Diagnosis Same as Pre-procedure Electronic Signature(s) Signed: 05/13/2023 5:32:55 PM By: [name] MD [place], Signed: 05/13/2023 5:32:55 PM By: [name] MD Milus Height ([id number]) [id number] Page 3 of 12 Entered By: [name] on 05/13/2023 06:13:43 -------------------------------------------------------------------------------- Debridement Details Patient Name: Date of Service: [name] 05/13/2023 8:45 A M Medical Record Number: [id number] Patient Account Number: [id number] Date of Birth/Sex: Treating RN: 1955/12/24 (67 y.o. M) Primary Care Provider: Cranford Mon Other Clinician: Referring Provider: Treating Provider/Extender: [name] in Treatment: 20 Debridement Performed for Assessment: Wound #5 Right,Distal,Lateral Lower Leg Performed By: Physician Maxwell Caul., MD Debridement Type: Debridement Severity of Tissue Pre Debridement: Fat layer exposed Level of Consciousness (Pre-procedure): Awake and Alert Pre-procedure Verification/Time Out Yes - 09:00 Taken: Start Time: 09:01 Pain  Control: Lidocaine 4% T opical Solution Percent of Wound Bed Debrided: 100% T Area Debrided (cm): otal 0.52 Tissue and other material debrided: Non-Viable, Slough, Slough Level: Non-Viable Tissue Debridement Description: Selective/Open Wound Instrument: Curette Bleeding: Minimum Hemostasis Achieved: Pressure End Time: 09:06 Procedural Pain: 0 Post Procedural Pain: 0 Response to Treatment: Procedure was tolerated well Level of Consciousness (Post- Awake and Alert procedure): Post Debridement Measurements of Total Wound Length: (cm) 1.1 Width: (cm) 0.6 Depth: (cm) 0.1 Volume: (cm) 0.052 Character of Wound/Ulcer Post Debridement: Improved Severity of Tissue Post Debridement: Fat layer exposed Post Procedure Diagnosis Same as Pre-procedure Electronic Signature(s) Signed: 05/13/2023 5:32:55 PM By: [name] MD Entered By: [name] on 05/13/2023 06:13:57 -------------------------------------------------------------------------------- HPI Details Patient Name: Date of Service: [name] 05/13/2023 8:45 A M Medical Record Number: [id number] Patient Account Number: [id number] Date of Birth/Sex: Treating RN: Mar 19, 1956 (67 y.o. M) Primary Care Provider: Cranford Mon Other Clinician: Referring Provider: Treating Provider/Extender: [name] in Treatment: [address], [place] Judie Petit ([id number]) [id number] Page 4 of 12 History of Present Illness HPI Description: 12/24/2022 Mr. Sohaib Vereen is a 67 year old male with a past medical history of controlled type 2 diabetes on insulin, lymphedema/chronic venous insufficiency and cirrhosis that presents to the clinic for a 1 year history of nonhealing wounds to his lower extremities bilaterally. He reports the starting spontaneously. He states that he has been following with [facility] and they have been using [facility] antibiotic spray to the legs. He is not wearing  compression stockings or wraps. He is also tried Medihoney and collagen in the past with little benefit. He currently denies systemic signs of infection. 5/30; patient presents for follow-up. He has been using triamcinolone cream to the periwound and Santyl to the wound beds. There has been improvement in wound healing. He denies signs of infection and has no issues or complaints today. 6/4; both legs look considerably better. He has a wound on the right lateral lower leg and the left medial lower leg. Significant hemosiderin in the right leg less so on the left. We have been using Hydrofera Blue under compression 6/13; patient presents for follow-up. We have been using antibiotic ointment with Hydrofera Blue under compression therapy. The wounds are smaller. He has no issues or complaints today. We discussed ordering juxta lite compression garment wraps T use once his wounds heal as he has hard time putting on o compression stockings. He was agreeable with this. 6/20; patient presents for follow-up. We have been using antibiotic ointment with Hydrofera Blue under compression therapy to the lower extremities  Post Debridement: Improved Severity of Tissue Post Debridement: Fat layer exposed Post Procedure Diagnosis Same as Pre-procedure Electronic Signature(s) Signed: 05/13/2023 5:32:55 PM By: [name] MD [place], Signed: 05/13/2023 5:32:55 PM By: [name] MD Milus Height ([id number]) [id number] Page 3 of 12 Entered By: [name] on 05/13/2023 06:13:43 -------------------------------------------------------------------------------- Debridement Details Patient Name: Date of Service: [name] 05/13/2023 8:45 A M Medical Record Number: [id number] Patient Account Number: [id number] Date of Birth/Sex: Treating RN: 1955/12/24 (67 y.o. M) Primary Care Provider: Cranford Mon Other Clinician: Referring Provider: Treating Provider/Extender: [name] in Treatment: 20 Debridement Performed for Assessment: Wound #5 Right,Distal,Lateral Lower Leg Performed By: Physician Maxwell Caul., MD Debridement Type: Debridement Severity of Tissue Pre Debridement: Fat layer exposed Level of Consciousness (Pre-procedure): Awake and Alert Pre-procedure Verification/Time Out Yes - 09:00 Taken: Start Time: 09:01 Pain  Control: Lidocaine 4% T opical Solution Percent of Wound Bed Debrided: 100% T Area Debrided (cm): otal 0.52 Tissue and other material debrided: Non-Viable, Slough, Slough Level: Non-Viable Tissue Debridement Description: Selective/Open Wound Instrument: Curette Bleeding: Minimum Hemostasis Achieved: Pressure End Time: 09:06 Procedural Pain: 0 Post Procedural Pain: 0 Response to Treatment: Procedure was tolerated well Level of Consciousness (Post- Awake and Alert procedure): Post Debridement Measurements of Total Wound Length: (cm) 1.1 Width: (cm) 0.6 Depth: (cm) 0.1 Volume: (cm) 0.052 Character of Wound/Ulcer Post Debridement: Improved Severity of Tissue Post Debridement: Fat layer exposed Post Procedure Diagnosis Same as Pre-procedure Electronic Signature(s) Signed: 05/13/2023 5:32:55 PM By: [name] MD Entered By: [name] on 05/13/2023 06:13:57 -------------------------------------------------------------------------------- HPI Details Patient Name: Date of Service: [name] 05/13/2023 8:45 A M Medical Record Number: [id number] Patient Account Number: [id number] Date of Birth/Sex: Treating RN: Mar 19, 1956 (67 y.o. M) Primary Care Provider: Cranford Mon Other Clinician: Referring Provider: Treating Provider/Extender: [name] in Treatment: [address], [place] Judie Petit ([id number]) [id number] Page 4 of 12 History of Present Illness HPI Description: 12/24/2022 Mr. Sohaib Vereen is a 67 year old male with a past medical history of controlled type 2 diabetes on insulin, lymphedema/chronic venous insufficiency and cirrhosis that presents to the clinic for a 1 year history of nonhealing wounds to his lower extremities bilaterally. He reports the starting spontaneously. He states that he has been following with [facility] and they have been using [facility] antibiotic spray to the legs. He is not wearing  compression stockings or wraps. He is also tried Medihoney and collagen in the past with little benefit. He currently denies systemic signs of infection. 5/30; patient presents for follow-up. He has been using triamcinolone cream to the periwound and Santyl to the wound beds. There has been improvement in wound healing. He denies signs of infection and has no issues or complaints today. 6/4; both legs look considerably better. He has a wound on the right lateral lower leg and the left medial lower leg. Significant hemosiderin in the right leg less so on the left. We have been using Hydrofera Blue under compression 6/13; patient presents for follow-up. We have been using antibiotic ointment with Hydrofera Blue under compression therapy. The wounds are smaller. He has no issues or complaints today. We discussed ordering juxta lite compression garment wraps T use once his wounds heal as he has hard time putting on o compression stockings. He was agreeable with this. 6/20; patient presents for follow-up. We have been using antibiotic ointment with Hydrofera Blue under compression therapy to the lower extremities  in/in) 1 x Per Day/30 Days ary Discharge Instructions: Apply directly to wound bed as directed Prim Dressing: Santyl Ointment 1 x Per Day/30 Days ary Discharge Instructions: Apply nickel thick amount to wound bed as instructed Secondary Dressing: Woven Gauze Sponge, Non-Sterile 4x4 in (DME) (Generic) 1 x Per Day/30 Days Discharge Instructions: Apply over primary dressing as directed. Secured With: [employer], 4.5x3.1 (in/yd) (Generic) 1 x Per Day/30 Days Discharge Instructions: Secure with Kerlix as directed. JAIMIN, KRUPKA ([id number]) [id number] Page 7 of 12 Secured With: 41M Medipore H Soft Cloth Surgical T ape, 4 x 10 (in/yd) (DME) (Generic) 1 x Per Day/30 Days Discharge Instructions: Secure with tape as directed. Electronic Signature(s) Signed: 05/13/2023 5:17:24 PM By:  [name] RN, BSN Signed: 05/13/2023 5:32:55 PM By: [name] MD Entered By: [name] on 05/13/2023 06:11:38 -------------------------------------------------------------------------------- Problem List Details Patient Name: Date of Service: [name] 05/13/2023 8:45 A M Medical Record Number: [id number] Patient Account Number: [id number] Date of Birth/Sex: Treating RN: 1955/10/30 (67 y.o. 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 05/13/2023 8:45 A M Medical Record Number: [id number] Patient Account Number: [id number] Date of Birth/Sex: Treating RN: 1955-09-26 (67 y.o. M) Primary Care Provider: Cranford Mon Other Clinician: Referring Provider: Treating Provider/Extender: [name] in  Treatment: 20 Subjective History of Present Illness (HPI) 12/24/2022 Mr. Decklan Mau is a 67 year old male with a past medical history of controlled type 2 diabetes on insulin, lymphedema/chronic venous insufficiency and cirrhosis that presents to the clinic for a 1 year history of nonhealing wounds to his lower extremities bilaterally. He reports the starting spontaneously. He states that he has been following with [facility] and they have been using [facility] antibiotic spray to the legs. He is not wearing compression stockings or wraps. He is also tried Medihoney and collagen in the past with little benefit. He currently denies systemic signs of infection. 5/30; patient presents for follow-up. He has been using triamcinolone cream to the periwound and Santyl to the wound beds. There has been improvement in wound healing. He denies signs of infection and has no issues or complaints today. 6/4; both legs look considerably better. He has a wound on the right lateral lower leg and the left medial lower leg. Significant hemosiderin in the right leg less so on the left. We have been using Hydrofera Blue under compression 6/13; patient presents for follow-up. We have been using antibiotic ointment with Hydrofera Blue under compression therapy. The wounds are smaller. He has no issues or complaints today. We discussed ordering juxta  HELIX, LAFONTAINE ([id number]) [id number] Page 1 of 12 Visit Report for 05/13/2023 Debridement Details Patient Name: Date of Service: [name]. 05/13/2023 8:45 A M Medical Record Number: [id number] Patient Account Number: [id number] Date of Birth/Sex: Treating RN: 11/28/1955 (67 y.o. [name] Primary Care Provider: Cranford Mon Other Clinician: Referring Provider: Treating Provider/Extender: [name] in Treatment: 20 Debridement Performed for Assessment: Wound #4 Left,Distal,Medial Lower Leg Performed By: Clinician [name], RN Debridement Type: Chemical/Enzymatic/Mechanical Agent Used: Santyl Severity of Tissue Pre Debridement: Fat layer exposed Level of Consciousness (Pre-procedure): Awake and Alert Pre-procedure Verification/Time Out No Taken: Percent of Wound Bed Debrided: Bleeding: None Response to Treatment: Procedure was tolerated well Level of Consciousness (Post- Awake and Alert procedure): Post Debridement Measurements of Total Wound Length: (cm) 1 Width: (cm) 0.8 Depth: (cm) 0.1 Volume: (cm) 0.063 Character of Wound/Ulcer Post Debridement: Requires Further Debridement Severity of Tissue Post Debridement: Fat layer exposed Post Procedure Diagnosis Same as Pre-procedure Electronic Signature(s) Signed: 05/13/2023 5:17:24 PM By: [name] RN, BSN Signed: 05/13/2023 5:32:55 PM By: [name] MD Entered By: [name] on 05/13/2023 06:12:21 -------------------------------------------------------------------------------- Debridement Details Patient Name: Date of Service: [name] 05/13/2023 8:45 A M Medical Record Number: [id number] Patient Account Number: [id number] Date of Birth/Sex: Treating RN: September 09, 1955 (67 y.o. [name] Primary Care Provider: Cranford Mon Other Clinician: Referring Provider: Treating Provider/Extender: [name] in  Treatment: 20 Debridement Performed for Assessment: Wound #6 Left,Proximal,Posterior Lower Leg Performed By: Clinician [name], RN Debridement Type: Chemical/Enzymatic/Mechanical Agent Used: Santyl Severity of Tissue Pre Debridement: Fat layer exposed Level of Consciousness (Pre-procedure): Awake and Alert Pre-procedure Verification/Time Out No Taken: Percent of Wound Bed Debrided: Bleeding: None Response to Treatment: Procedure was tolerated well Level of Consciousness Arlie SolomonsNORVILLE, DANI ([id number]) [id number] Page 2 of 12 Level of Consciousness (Post- Awake and Alert procedure): Post Debridement Measurements of Total Wound Length: (cm) 0.9 Width: (cm) 0.6 Depth: (cm) 0.1 Volume: (cm) 0.042 Character of Wound/Ulcer Post Debridement: Requires Further Debridement Severity of Tissue Post Debridement: Fat layer exposed Post Procedure Diagnosis Same as Pre-procedure Electronic Signature(s) Signed: 05/13/2023 5:17:24 PM By: [name] RN, BSN Signed: 05/13/2023 5:32:55 PM By: [name] MD Entered By: [name] on 05/13/2023 06:12:41 -------------------------------------------------------------------------------- Debridement Details Patient Name: Date of Service: [name] 05/13/2023 8:45 A M Medical Record Number: [id number] Patient Account Number: [id number] Date of Birth/Sex: Treating RN: 12/20/1955 (67 y.o. M) Primary Care Provider: Cranford Mon Other Clinician: Referring Provider: Treating Provider/Extender: [name] in Treatment: 20 Debridement Performed for Assessment: Wound #3 Right,Proximal,Anterior Lower Leg Performed By: Physician Maxwell Caul., MD The following information was scribed by: [name] The information was scribed for: [name] Debridement Type: Debridement Severity of Tissue Pre Debridement: Fat layer exposed Level of Consciousness (Pre-procedure):  Awake and Alert Pre-procedure Verification/Time Out Yes - 09:00 Taken: Start Time: 09:01 Pain Control: Lidocaine 4% T opical Solution Percent of Wound Bed Debrided: 100% T Area Debrided (cm): otal 0.63 Tissue and other material debrided: Non-Viable, Slough, Slough Level: Non-Viable Tissue Debridement Description: Selective/Open Wound Instrument: Curette Bleeding: Minimum Hemostasis Achieved: Pressure End Time: 09:06 Procedural Pain: 0 Post Procedural Pain: 0 Response to Treatment: Procedure was tolerated well Level of Consciousness (Post- Awake and Alert procedure): Post Debridement Measurements of Total Wound Length: (cm) 1 Width: (cm) 0.8 Depth: (cm) 0.1 Volume: (cm) 0.063 Character of Wound/Ulcer  in/in) 1 x Per Day/30 Days ary Discharge Instructions: Apply directly to wound bed as directed Prim Dressing: Santyl Ointment 1 x Per Day/30 Days ary Discharge Instructions: Apply nickel thick amount to wound bed as instructed Secondary Dressing: Woven Gauze Sponge, Non-Sterile 4x4 in (DME) (Generic) 1 x Per Day/30 Days Discharge Instructions: Apply over primary dressing as directed. Secured With: [employer], 4.5x3.1 (in/yd) (Generic) 1 x Per Day/30 Days Discharge Instructions: Secure with Kerlix as directed. JAIMIN, KRUPKA ([id number]) [id number] Page 7 of 12 Secured With: 41M Medipore H Soft Cloth Surgical T ape, 4 x 10 (in/yd) (DME) (Generic) 1 x Per Day/30 Days Discharge Instructions: Secure with tape as directed. Electronic Signature(s) Signed: 05/13/2023 5:17:24 PM By:  [name] RN, BSN Signed: 05/13/2023 5:32:55 PM By: [name] MD Entered By: [name] on 05/13/2023 06:11:38 -------------------------------------------------------------------------------- Problem List Details Patient Name: Date of Service: [name] 05/13/2023 8:45 A M Medical Record Number: [id number] Patient Account Number: [id number] Date of Birth/Sex: Treating RN: 1955/10/30 (67 y.o. 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 05/13/2023 8:45 A M Medical Record Number: [id number] Patient Account Number: [id number] Date of Birth/Sex: Treating RN: 1955-09-26 (67 y.o. M) Primary Care Provider: Cranford Mon Other Clinician: Referring Provider: Treating Provider/Extender: [name] in  Treatment: 20 Subjective History of Present Illness (HPI) 12/24/2022 Mr. Decklan Mau is a 67 year old male with a past medical history of controlled type 2 diabetes on insulin, lymphedema/chronic venous insufficiency and cirrhosis that presents to the clinic for a 1 year history of nonhealing wounds to his lower extremities bilaterally. He reports the starting spontaneously. He states that he has been following with [facility] and they have been using [facility] antibiotic spray to the legs. He is not wearing compression stockings or wraps. He is also tried Medihoney and collagen in the past with little benefit. He currently denies systemic signs of infection. 5/30; patient presents for follow-up. He has been using triamcinolone cream to the periwound and Santyl to the wound beds. There has been improvement in wound healing. He denies signs of infection and has no issues or complaints today. 6/4; both legs look considerably better. He has a wound on the right lateral lower leg and the left medial lower leg. Significant hemosiderin in the right leg less so on the left. We have been using Hydrofera Blue under compression 6/13; patient presents for follow-up. We have been using antibiotic ointment with Hydrofera Blue under compression therapy. The wounds are smaller. He has no issues or complaints today. We discussed ordering juxta  in/in) 1 x Per Day/30 Days ary Discharge Instructions: Apply directly to wound bed as directed Prim Dressing: Santyl Ointment 1 x Per Day/30 Days ary Discharge Instructions: Apply nickel thick amount to wound bed as instructed Secondary Dressing: Woven Gauze Sponge, Non-Sterile 4x4 in (DME) (Generic) 1 x Per Day/30 Days Discharge Instructions: Apply over primary dressing as directed. Secured With: [employer], 4.5x3.1 (in/yd) (Generic) 1 x Per Day/30 Days Discharge Instructions: Secure with Kerlix as directed. JAIMIN, KRUPKA ([id number]) [id number] Page 7 of 12 Secured With: 41M Medipore H Soft Cloth Surgical T ape, 4 x 10 (in/yd) (DME) (Generic) 1 x Per Day/30 Days Discharge Instructions: Secure with tape as directed. Electronic Signature(s) Signed: 05/13/2023 5:17:24 PM By:  [name] RN, BSN Signed: 05/13/2023 5:32:55 PM By: [name] MD Entered By: [name] on 05/13/2023 06:11:38 -------------------------------------------------------------------------------- Problem List Details Patient Name: Date of Service: [name] 05/13/2023 8:45 A M Medical Record Number: [id number] Patient Account Number: [id number] Date of Birth/Sex: Treating RN: 1955/10/30 (67 y.o. 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 05/13/2023 8:45 A M Medical Record Number: [id number] Patient Account Number: [id number] Date of Birth/Sex: Treating RN: 1955-09-26 (67 y.o. M) Primary Care Provider: Cranford Mon Other Clinician: Referring Provider: Treating Provider/Extender: [name] in  Treatment: 20 Subjective History of Present Illness (HPI) 12/24/2022 Mr. Decklan Mau is a 67 year old male with a past medical history of controlled type 2 diabetes on insulin, lymphedema/chronic venous insufficiency and cirrhosis that presents to the clinic for a 1 year history of nonhealing wounds to his lower extremities bilaterally. He reports the starting spontaneously. He states that he has been following with [facility] and they have been using [facility] antibiotic spray to the legs. He is not wearing compression stockings or wraps. He is also tried Medihoney and collagen in the past with little benefit. He currently denies systemic signs of infection. 5/30; patient presents for follow-up. He has been using triamcinolone cream to the periwound and Santyl to the wound beds. There has been improvement in wound healing. He denies signs of infection and has no issues or complaints today. 6/4; both legs look considerably better. He has a wound on the right lateral lower leg and the left medial lower leg. Significant hemosiderin in the right leg less so on the left. We have been using Hydrofera Blue under compression 6/13; patient presents for follow-up. We have been using antibiotic ointment with Hydrofera Blue under compression therapy. The wounds are smaller. He has no issues or complaints today. We discussed ordering juxta  [name] 05/13/2023 Medical Record Number: [id number] Patient Account Number: [id number] Date of Birth/Sex: Treating RN: Jun 05, 1956 (67 y.o. 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 Lymphedema, not elsewhere classified E11.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 other skin ulcer K74.69 Other cirrhosis of liver Facility Procedures : CPT4 Code: [id number] Description: 97597 - DEBRIDE WOUND 1ST 20 SQ CM OR < ICD-10 Diagnosis Description L97.818 Non-pressure chronic ulcer of other part of right lower leg with other specified s Modifier: everity Quantity: 1 : CPT4 Code: [id number] Description: 78469 - DEBRIDE W/O ANES NON SELECT Modifier: 59 Quantity: 1 Physician Procedures : CPT4 Code Description Modifier [id number] 97597 - WC PHYS DEBR WO ANESTH 20 SQ CM ICD-10 Diagnosis Description L97.818 Non-pressure chronic ulcer of other part of right lower leg with other specified severity Quantity: 1 Electronic Signature(s) Signed: 05/13/2023 5:32:55 PM By: [name] MD [place], Signed: 05/13/2023 5:32:55 PM By: [name] MD Milus Height ([id number]) [id number] Page 12 of 12 Entered By: [name] on 05/13/2023 06:18:31  Post Debridement: Improved Severity of Tissue Post Debridement: Fat layer exposed Post Procedure Diagnosis Same as Pre-procedure Electronic Signature(s) Signed: 05/13/2023 5:32:55 PM By: [name] MD [place], Signed: 05/13/2023 5:32:55 PM By: [name] MD Milus Height ([id number]) [id number] Page 3 of 12 Entered By: [name] on 05/13/2023 06:13:43 -------------------------------------------------------------------------------- Debridement Details Patient Name: Date of Service: [name] 05/13/2023 8:45 A M Medical Record Number: [id number] Patient Account Number: [id number] Date of Birth/Sex: Treating RN: 1955/12/24 (67 y.o. M) Primary Care Provider: Cranford Mon Other Clinician: Referring Provider: Treating Provider/Extender: [name] in Treatment: 20 Debridement Performed for Assessment: Wound #5 Right,Distal,Lateral Lower Leg Performed By: Physician Maxwell Caul., MD Debridement Type: Debridement Severity of Tissue Pre Debridement: Fat layer exposed Level of Consciousness (Pre-procedure): Awake and Alert Pre-procedure Verification/Time Out Yes - 09:00 Taken: Start Time: 09:01 Pain  Control: Lidocaine 4% T opical Solution Percent of Wound Bed Debrided: 100% T Area Debrided (cm): otal 0.52 Tissue and other material debrided: Non-Viable, Slough, Slough Level: Non-Viable Tissue Debridement Description: Selective/Open Wound Instrument: Curette Bleeding: Minimum Hemostasis Achieved: Pressure End Time: 09:06 Procedural Pain: 0 Post Procedural Pain: 0 Response to Treatment: Procedure was tolerated well Level of Consciousness (Post- Awake and Alert procedure): Post Debridement Measurements of Total Wound Length: (cm) 1.1 Width: (cm) 0.6 Depth: (cm) 0.1 Volume: (cm) 0.052 Character of Wound/Ulcer Post Debridement: Improved Severity of Tissue Post Debridement: Fat layer exposed Post Procedure Diagnosis Same as Pre-procedure Electronic Signature(s) Signed: 05/13/2023 5:32:55 PM By: [name] MD Entered By: [name] on 05/13/2023 06:13:57 -------------------------------------------------------------------------------- HPI Details Patient Name: Date of Service: [name] 05/13/2023 8:45 A M Medical Record Number: [id number] Patient Account Number: [id number] Date of Birth/Sex: Treating RN: Mar 19, 1956 (67 y.o. M) Primary Care Provider: Cranford Mon Other Clinician: Referring Provider: Treating Provider/Extender: [name] in Treatment: [address], [place] Judie Petit ([id number]) [id number] Page 4 of 12 History of Present Illness HPI Description: 12/24/2022 Mr. Sohaib Vereen is a 67 year old male with a past medical history of controlled type 2 diabetes on insulin, lymphedema/chronic venous insufficiency and cirrhosis that presents to the clinic for a 1 year history of nonhealing wounds to his lower extremities bilaterally. He reports the starting spontaneously. He states that he has been following with [facility] and they have been using [facility] antibiotic spray to the legs. He is not wearing  compression stockings or wraps. He is also tried Medihoney and collagen in the past with little benefit. He currently denies systemic signs of infection. 5/30; patient presents for follow-up. He has been using triamcinolone cream to the periwound and Santyl to the wound beds. There has been improvement in wound healing. He denies signs of infection and has no issues or complaints today. 6/4; both legs look considerably better. He has a wound on the right lateral lower leg and the left medial lower leg. Significant hemosiderin in the right leg less so on the left. We have been using Hydrofera Blue under compression 6/13; patient presents for follow-up. We have been using antibiotic ointment with Hydrofera Blue under compression therapy. The wounds are smaller. He has no issues or complaints today. We discussed ordering juxta lite compression garment wraps T use once his wounds heal as he has hard time putting on o compression stockings. He was agreeable with this. 6/20; patient presents for follow-up. We have been using antibiotic ointment with Hydrofera Blue under compression therapy to the lower extremities  HELIX, LAFONTAINE ([id number]) [id number] Page 1 of 12 Visit Report for 05/13/2023 Debridement Details Patient Name: Date of Service: [name]. 05/13/2023 8:45 A M Medical Record Number: [id number] Patient Account Number: [id number] Date of Birth/Sex: Treating RN: 11/28/1955 (67 y.o. [name] Primary Care Provider: Cranford Mon Other Clinician: Referring Provider: Treating Provider/Extender: [name] in Treatment: 20 Debridement Performed for Assessment: Wound #4 Left,Distal,Medial Lower Leg Performed By: Clinician [name], RN Debridement Type: Chemical/Enzymatic/Mechanical Agent Used: Santyl Severity of Tissue Pre Debridement: Fat layer exposed Level of Consciousness (Pre-procedure): Awake and Alert Pre-procedure Verification/Time Out No Taken: Percent of Wound Bed Debrided: Bleeding: None Response to Treatment: Procedure was tolerated well Level of Consciousness (Post- Awake and Alert procedure): Post Debridement Measurements of Total Wound Length: (cm) 1 Width: (cm) 0.8 Depth: (cm) 0.1 Volume: (cm) 0.063 Character of Wound/Ulcer Post Debridement: Requires Further Debridement Severity of Tissue Post Debridement: Fat layer exposed Post Procedure Diagnosis Same as Pre-procedure Electronic Signature(s) Signed: 05/13/2023 5:17:24 PM By: [name] RN, BSN Signed: 05/13/2023 5:32:55 PM By: [name] MD Entered By: [name] on 05/13/2023 06:12:21 -------------------------------------------------------------------------------- Debridement Details Patient Name: Date of Service: [name] 05/13/2023 8:45 A M Medical Record Number: [id number] Patient Account Number: [id number] Date of Birth/Sex: Treating RN: September 09, 1955 (67 y.o. [name] Primary Care Provider: Cranford Mon Other Clinician: Referring Provider: Treating Provider/Extender: [name] in  Treatment: 20 Debridement Performed for Assessment: Wound #6 Left,Proximal,Posterior Lower Leg Performed By: Clinician [name], RN Debridement Type: Chemical/Enzymatic/Mechanical Agent Used: Santyl Severity of Tissue Pre Debridement: Fat layer exposed Level of Consciousness (Pre-procedure): Awake and Alert Pre-procedure Verification/Time Out No Taken: Percent of Wound Bed Debrided: Bleeding: None Response to Treatment: Procedure was tolerated well Level of Consciousness Arlie SolomonsNORVILLE, DANI ([id number]) [id number] Page 2 of 12 Level of Consciousness (Post- Awake and Alert procedure): Post Debridement Measurements of Total Wound Length: (cm) 0.9 Width: (cm) 0.6 Depth: (cm) 0.1 Volume: (cm) 0.042 Character of Wound/Ulcer Post Debridement: Requires Further Debridement Severity of Tissue Post Debridement: Fat layer exposed Post Procedure Diagnosis Same as Pre-procedure Electronic Signature(s) Signed: 05/13/2023 5:17:24 PM By: [name] RN, BSN Signed: 05/13/2023 5:32:55 PM By: [name] MD Entered By: [name] on 05/13/2023 06:12:41 -------------------------------------------------------------------------------- Debridement Details Patient Name: Date of Service: [name] 05/13/2023 8:45 A M Medical Record Number: [id number] Patient Account Number: [id number] Date of Birth/Sex: Treating RN: 12/20/1955 (67 y.o. M) Primary Care Provider: Cranford Mon Other Clinician: Referring Provider: Treating Provider/Extender: [name] in Treatment: 20 Debridement Performed for Assessment: Wound #3 Right,Proximal,Anterior Lower Leg Performed By: Physician Maxwell Caul., MD The following information was scribed by: [name] The information was scribed for: [name] Debridement Type: Debridement Severity of Tissue Pre Debridement: Fat layer exposed Level of Consciousness (Pre-procedure):  Awake and Alert Pre-procedure Verification/Time Out Yes - 09:00 Taken: Start Time: 09:01 Pain Control: Lidocaine 4% T opical Solution Percent of Wound Bed Debrided: 100% T Area Debrided (cm): otal 0.63 Tissue and other material debrided: Non-Viable, Slough, Slough Level: Non-Viable Tissue Debridement Description: Selective/Open Wound Instrument: Curette Bleeding: Minimum Hemostasis Achieved: Pressure End Time: 09:06 Procedural Pain: 0 Post Procedural Pain: 0 Response to Treatment: Procedure was tolerated well Level of Consciousness (Post- Awake and Alert procedure): Post Debridement Measurements of Total Wound Length: (cm) 1 Width: (cm) 0.8 Depth: (cm) 0.1 Volume: (cm) 0.063 Character of Wound/Ulcer

## 2023-05-15 ENCOUNTER — Ambulatory Visit (HOSPITAL_COMMUNITY): Admission: RE | Admit: 2023-05-15 | Payer: Medicare Other | Source: Ambulatory Visit

## 2023-05-16 ENCOUNTER — Ambulatory Visit (HOSPITAL_COMMUNITY)
Admission: RE | Admit: 2023-05-16 | Discharge: 2023-05-16 | Disposition: A | Discharge status: Home / Self Care | Payer: Medicare Other | Source: Ambulatory Visit | Attending: Internal Medicine | Admitting: Internal Medicine

## 2023-05-16 DIAGNOSIS — Z1289 Encounter for screening for malignant neoplasm of other sites: Secondary | ICD-10-CM | POA: Insufficient documentation

## 2023-05-20 ENCOUNTER — Encounter (HOSPITAL_BASED_OUTPATIENT_CLINIC_OR_DEPARTMENT_OTHER): Payer: Medicare Other | Admitting: Internal Medicine

## 2023-05-20 DIAGNOSIS — I87313 Chronic venous hypertension (idiopathic) with ulcer of bilateral lower extremity: Secondary | ICD-10-CM | POA: Diagnosis not present

## 2023-05-22 NOTE — Progress Notes (Signed)
Apply nickel thick amount to wound bed as instructed Secondary Dressing Woven Gauze Sponge, Non-Sterile 4x4 in Discharge Instruction: Apply over primary dressing as directed. Secured With [employer], 4.5x3.1 (in/yd) Discharge Instruction: Secure with Kerlix as directed. 57M Medipore H Soft Cloth Surgical T ape, 4 x 10 (in/yd) Discharge Instruction: Secure with tape as directed. Compression Wrap Compression Stockings Add-Ons Electronic Signature(s) Signed: 05/20/2023 4:41:28 PM By: [name] MD [place], Signed: 05/20/2023 4:41:28 PM  By: [name] MD Milus Height ([id number]) [id number] Page 8 of 16 Entered By: [name] on 05/20/2023 06:08:31 -------------------------------------------------------------------------------- Multi-Disciplinary Care Plan Details Patient Name: Date of Service: [name] [name]. 05/20/2023 8:15 A [name] Medical Record Number: [id number] Patient Account Number: [id number] Date of Birth/Sex: Treating RN: 1956/07/08 (67 y.o. [name] Primary Care Orvile Corona: [place]ford Mon Other Clinician: Referring Herberta Pickron: Treating Danyell Awbrey/Extender: [name] in Treatment: 21 Active Inactive Pain, Acute or Chronic Nursing Diagnoses: Pain, acute or chronic: actual or potential Potential alteration in comfort, pain Goals: Patient will verbalize adequate pain control and receive pain control interventions during procedures as needed Date Initiated: 12/24/2022 Target Resolution Date: 06/28/2023 Goal Status: Active Patient/caregiver will verbalize comfort level met Date Initiated: 12/24/2022 Date Inactivated: 01/31/2023 Target Resolution Date: 02/14/2023 Goal Status: Met Interventions: Encourage patient to take pain medications as prescribed Provide education on pain management Treatment Activities: Administer pain control measures as ordered : 12/24/2022 Notes: Electronic Signature(s) Signed: 05/22/2023 4:07:14 PM By: Brenton Grills Entered By: Brenton Grills on 05/20/2023 05:31:37 -------------------------------------------------------------------------------- Pain Assessment Details Patient Name: Date of Service: [name] [name] RRIS [name]. 05/20/2023 8:15 A [name] Medical Record Number: [id number] Patient Account Number: [id number] Date of Birth/Sex: Treating RN: May 22, 1956 (67 y.o. [name] Primary Care Monifa Blanchette: Cranford Mon Other Clinician: Referring Sanaia Jasso: Treating Chantil Bari/Extender: [name] in  Treatment: 21 Active Problems Location of Pain Severity and Description of Pain Patient Has Paino No Site Locations [place], [place] ([id number]) [id number] Page 9 of 16 Pain Management and Medication Current Pain Management: Electronic Signature(s) Signed: 05/22/2023 4:07:14 PM By: Brenton Grills Entered By: Brenton Grills on 05/20/2023 05:18:26 -------------------------------------------------------------------------------- Patient/Caregiver Education Details Patient Name: Date of Service: [name] [name]. 10/14/2024andnbsp8:15 A [name] Medical Record Number: [id number] Patient Account Number: [id number] Date of Birth/Gender: Treating RN: 23-Aug-1955 (67 y.o. [name] Primary Care Physician: Cranford Mon Other Clinician: Referring Physician: Treating Physician/Extender: [name] in Treatment: 21 Education Assessment Education Provided To: Patient and Caregiver Education Topics Provided Wound/Skin Impairment: Methods: Explain/Verbal Responses: State content correctly Electronic Signature(s) Signed: 05/22/2023 4:07:14 PM By: Brenton Grills Entered By: Brenton Grills on 05/20/2023 05:31:55 -------------------------------------------------------------------------------- Wound Assessment Details Patient Name: Date of Service: [name] [name] RRIS [name]. 05/20/2023 8:15 A [name] Medical Record Number: [id number] Patient Account Number: [id number] Date of Birth/Sex: Treating RN: 01/12/1956 (67 y.o. [name] Primary Care Audre Cenci: Cranford Mon Other Clinician: Referring Jaevin Medearis: Treating Octa Uplinger/Extender: Kaled, Allende [place] ([id number]) [id number] Page 10 of 16 Weeks in Treatment: 21 Wound Status Wound Number: 3 Primary Diabetic Wound/Ulcer of the Lower Extremity Etiology: Wound Location: Right, Proximal, Anterior Lower Leg Wound Status: Open Wounding Event: Gradually  Appeared Comorbid Anemia, Lymphedema, Hypertension, Cirrhosis , Type II Date Acquired: 01/25/2023 History: Diabetes Weeks Of Treatment: 15 Clustered Wound: No Photos Wound Measurements Length: (cm) Width: (cm) Depth: (cm) Clustered Quantity: Area: (cm) Volume: (cm) 1 % Reduction in Area: -784.5% 0.8 %  Reduction in Volume: -350% 0.1 Epithelialization: Small (1-33%) 3 Tunneling: No 0.628 Undermining: No 0.063 Wound Description Classification: Grade 1 Wound Margin: Distinct, outline attached Exudate Amount: Medium Exudate Type: Serosanguineous Exudate Color: red, brown Foul Odor After Cleansing: No Slough/Fibrino Yes Wound Bed Granulation Amount: None Present (0%) Exposed Structure Necrotic Amount: Large (67-100%) Fascia Exposed: No Necrotic Quality: Adherent Slough Fat Layer (Subcutaneous Tissue) Exposed: Yes Tendon Exposed: No Muscle Exposed: No Joint Exposed: No Bone Exposed: No Periwound Skin Texture Texture Color No Abnormalities Noted: No No Abnormalities Noted: No Callus: No Atrophie Blanche: No Crepitus: No Cyanosis: No Excoriation: No Ecchymosis: No Induration: No Erythema: No Rash: No Hemosiderin Staining: No Scarring: No Mottled: No Pallor: No Moisture Rubor: No No Abnormalities Noted: No Dry / Scaly: No Temperature / Pain Maceration: No Temperature: No Abnormality Treatment Notes Wound #3 (Lower Leg) Wound Laterality: Right, Anterior, Proximal Cleanser Soap and Water Discharge Instruction: May shower and wash wound with dial antibacterial soap and water prior to dressing change. Peri-Wound Care Triamcinolone 15 (g) Discharge Instruction: Use triamcinolone apply to entire both legs. around the wounds. IVA, POSTEN ([id number]) [id number] Page 11 of 16 Topical Primary Dressing Hydrofera Blue Ready Transfer Foam, 2.5x2.5 (in/in) Discharge Instruction: Apply directly to wound bed as directed Santyl  Ointment Discharge Instruction: Apply nickel thick amount to wound bed as instructed Secondary Dressing Woven Gauze Sponge, Non-Sterile 4x4 in Discharge Instruction: Apply over primary dressing as directed. Secured With [employer], 4.5x3.1 (in/yd) Discharge Instruction: Secure with Kerlix as directed. 13M Medipore H Soft Cloth Surgical T ape, 4 x 10 (in/yd) Discharge Instruction: Secure with tape as directed. Compression Wrap Compression Stockings Add-Ons Electronic Signature(s) Signed: 05/22/2023 4:07:14 PM By: Brenton Grills Entered By: Brenton Grills on 05/20/2023 05:27:59 -------------------------------------------------------------------------------- Wound Assessment Details Patient Name: Date of Service: [name] [name] RRIS [name]. 05/20/2023 8:15 A [name] Medical Record Number: [id number] Patient Account Number: [id number] Date of Birth/Sex: Treating RN: Jul 20, 1956 (67 y.o. [name] Primary Care Shadavia Dampier: Cranford Mon Other Clinician: Referring Morgann Woodburn: Treating Shamicka Inga/Extender: [name] in Treatment: 21 Wound Status Wound Number: 4 Primary Etiology: Diabetic Wound/Ulcer of the Lower Extremity Wound Location: Left, Distal, Medial Lower Leg Secondary Venous Leg Ulcer Etiology: Wounding Event: Gradually Appeared Wound Status: Open Date Acquired: 02/11/2023 Comorbid Anemia, Lymphedema, Hypertension, Cirrhosis , Type II Weeks Of Treatment: 14 History: Diabetes Clustered Wound: No Photos Wound Measurements Length: (cm) 0.4 Width: (cm) 0.5 Depth: (cm) 0.1 Area: (cm) 0.157 Volume: (cm) 0.016 Sacra, [name] [name] ([id number]) % Reduction in Area: 59.2% % Reduction in Volume: 57.9% Epithelialization: Small (1-33%) Tunneling: No Undermining: No [id number] Page 12 of 16 Wound Description Classification: Grade 1 Wound Margin: Distinct, outline attached Exudate Amount: Medium Exudate Type: Serosanguineous Exudate  Color: red, brown Foul Odor After Cleansing: No Slough/Fibrino Yes Wound Bed Granulation Amount: Small (1-33%) Exposed Structure Granulation Quality: Hyper-granulation Fascia Exposed: No Necrotic Amount: Large (67-100%) Fat Layer (Subcutaneous Tissue) Exposed: Yes Necrotic Quality: Adherent Slough Tendon Exposed: No Muscle Exposed: No Joint Exposed: No Bone Exposed: No Periwound Skin Texture Texture Color No Abnormalities Noted: No No Abnormalities Noted: No Callus: No Atrophie Blanche: No Crepitus: No Cyanosis: No Excoriation: No Ecchymosis: No Induration: No Erythema: No Rash: No Hemosiderin Staining: No Scarring: No Mottled: No Pallor: No Moisture Rubor: No No Abnormalities Noted: No Dry / Scaly: No Temperature / Pain Maceration: No Temperature: No Abnormality Treatment Notes Wound #4 (Lower Leg) Wound Laterality: Left, Medial, Distal Cleanser Soap and Water Discharge Instruction:  of Service: [name] [name]. 05/20/2023 8:15 A [name] Medical Record Number: [id number] Patient Account Number: [id number] Date of Birth/Sex: Treating RN: Dec 24, 1955 (67 y.o. [name]) Primary Care Ward Boissonneault: Cranford Mon Other Clinician: Referring Andrew Blasius: Treating Gwendolyn Nishi/Extender: [name] in Treatment: 21 Vital Signs Height(in): 72 Capillary Blood Glucose(mg/dl): 562 Weight(lbs): 130 Pulse(bpm): 52 Body Mass Index(BMI): 33.2 Blood Pressure(mmHg): 115/62 Temperature(F): 97.9 Respiratory Rate(breaths/min): 18 [3:Photos:] Right, Proximal, Anterior Lower Leg Left, Distal, Medial Lower Leg Right, Distal, Lateral Lower Leg Wound Location: Gradually Appeared Gradually Appeared Blister Wounding Event: Diabetic Wound/Ulcer of the Lower Diabetic Wound/Ulcer of the Lower Diabetic Wound/Ulcer of the Lower Primary Etiology: Extremity Extremity Extremity N/A Venous Leg Ulcer N/A Secondary Etiology: Anemia, Lymphedema, Hypertension, Anemia, Lymphedema, Hypertension, Anemia, Lymphedema, Hypertension, Comorbid History: Cirrhosis , Type II Diabetes Cirrhosis , Type II Diabetes Cirrhosis , Type II Diabetes 01/25/2023 02/11/2023 02/19/2023 Date Acquired: 15 14 12  Weeks of Treatment: Open Open Open Wound Status: No No No Wound Recurrence: No No Yes Clustered Wound: 3  N/A 1 Clustered Quantity: 1x0.8x0.1 0.4x0.5x0.1 0.8x0.6x0.1 Measurements L x W x D (cm) 0.628 0.157 0.377 A (cm) : rea 0.063 0.016 0.038 Volume (cm) : -784.50% 59.20% 40.70% % Reduction in Area: -350.00% 57.90% 40.60% % Reduction in VolumeKAVISH, [name] [name] ([id number]) [id number] Page 5 of 16 Grade 1 Grade 1 Grade 1 Classification: Medium Medium Medium Exudate A mount: Serosanguineous Serosanguineous Serosanguineous Exudate Type: red, brown red, brown red, brown Exudate Color: Distinct, outline attached Distinct, outline attached Distinct, outline attached Wound Margin: None Present (0%) Small (1-33%) Large (67-100%) Granulation A mount: N/A Hyper-granulation Red, Pink Granulation Quality: Large (67-100%) Large (67-100%) Small (1-33%) Necrotic A mount: Fat Layer (Subcutaneous Tissue): Yes Fat Layer (Subcutaneous Tissue): Yes Fat Layer (Subcutaneous Tissue): Yes Exposed Structures: Fascia: No Fascia: No Fascia: No Tendon: No Tendon: No Tendon: No Muscle: No Muscle: No Muscle: No Joint: No Joint: No Joint: No Bone: No Bone: No Bone: No Small (1-33%) Small (1-33%) Small (1-33%) Epithelialization: Excoriation: No Excoriation: No Excoriation: No Periwound Skin Texture: Induration: No Induration: No Induration: No Callus: No Callus: No Callus: No Crepitus: No Crepitus: No Crepitus: No Rash: No Rash: No Rash: No Scarring: No Scarring: No Scarring: No Maceration: No Maceration: No Maceration: No Periwound Skin Moisture: Dry/Scaly: No Dry/Scaly: No Dry/Scaly: No Atrophie Blanche: No Atrophie Blanche: No Atrophie Blanche: No Periwound Skin Color: Cyanosis: No Cyanosis: No Cyanosis: No Ecchymosis: No Ecchymosis: No Ecchymosis: No Erythema: No Erythema: No Erythema: No Hemosiderin Staining: No Hemosiderin Staining: No Hemosiderin Staining: No Mottled: No Mottled: No Mottled: No Pallor: No Pallor: No Pallor:  No Rubor: No Rubor: No Rubor: No No Abnormality No Abnormality No Abnormality Temperature: Wound Number: 6 N/A N/A Photos: N/A N/A Left, Proximal, Posterior Lower Leg N/A N/A Wound Location: Blister N/A N/A Wounding Event: Diabetic Wound/Ulcer of the Lower N/A N/A Primary Etiology: Extremity N/A N/A N/A Secondary Etiology: Anemia, Lymphedema, Hypertension, N/A N/A Comorbid History: Cirrhosis , Type II Diabetes 02/19/2023 N/A N/A Date Acquired: 12 N/A N/A Weeks of Treatment: Open N/A N/A Wound Status: No N/A N/A Wound Recurrence: No N/A N/A Clustered Wound: N/A N/A N/A Clustered Quantity: 0.4x0.4x0.1 N/A N/A Measurements L x W x D (cm) 0.126 N/A N/A A (cm) : rea 0.013 N/A N/A Volume (cm) : 92.40% N/A N/A % Reduction in A rea: 92.10% N/A N/A % Reduction in Volume: Grade 1 N/A N/A Classification: Medium N/A N/A Exudate A mount: Serosanguineous N/A N/A Exudate Type: red, brown  Reduction in Volume: -350% 0.1 Epithelialization: Small (1-33%) 3 Tunneling: No 0.628 Undermining: No 0.063 Wound Description Classification: Grade 1 Wound Margin: Distinct, outline attached Exudate Amount: Medium Exudate Type: Serosanguineous Exudate Color: red, brown Foul Odor After Cleansing: No Slough/Fibrino Yes Wound Bed Granulation Amount: None Present (0%) Exposed Structure Necrotic Amount: Large (67-100%) Fascia Exposed: No Necrotic Quality: Adherent Slough Fat Layer (Subcutaneous Tissue) Exposed: Yes Tendon Exposed: No Muscle Exposed: No Joint Exposed: No Bone Exposed: No Periwound Skin Texture Texture Color No Abnormalities Noted: No No Abnormalities Noted: No Callus: No Atrophie Blanche: No Crepitus: No Cyanosis: No Excoriation: No Ecchymosis: No Induration: No Erythema: No Rash: No Hemosiderin Staining: No Scarring: No Mottled: No Pallor: No Moisture Rubor: No No Abnormalities Noted: No Dry / Scaly: No Temperature / Pain Maceration: No Temperature: No Abnormality Treatment Notes Wound #3 (Lower Leg) Wound Laterality: Right, Anterior, Proximal Cleanser Soap and Water Discharge Instruction: May shower and wash wound with dial antibacterial soap and water prior to dressing change. Peri-Wound Care Triamcinolone 15 (g) Discharge Instruction: Use triamcinolone apply to entire both legs. around the wounds. IVA, POSTEN ([id number]) [id number] Page 11 of 16 Topical Primary Dressing Hydrofera Blue Ready Transfer Foam, 2.5x2.5 (in/in) Discharge Instruction: Apply directly to wound bed as directed Santyl  Ointment Discharge Instruction: Apply nickel thick amount to wound bed as instructed Secondary Dressing Woven Gauze Sponge, Non-Sterile 4x4 in Discharge Instruction: Apply over primary dressing as directed. Secured With [employer], 4.5x3.1 (in/yd) Discharge Instruction: Secure with Kerlix as directed. 13M Medipore H Soft Cloth Surgical T ape, 4 x 10 (in/yd) Discharge Instruction: Secure with tape as directed. Compression Wrap Compression Stockings Add-Ons Electronic Signature(s) Signed: 05/22/2023 4:07:14 PM By: Brenton Grills Entered By: Brenton Grills on 05/20/2023 05:27:59 -------------------------------------------------------------------------------- Wound Assessment Details Patient Name: Date of Service: [name] [name] RRIS [name]. 05/20/2023 8:15 A [name] Medical Record Number: [id number] Patient Account Number: [id number] Date of Birth/Sex: Treating RN: Jul 20, 1956 (67 y.o. [name] Primary Care Shadavia Dampier: [place]ford Mon Other Clinician: Referring Morgann Woodburn: Treating Shamicka Inga/Extender: [name] in Treatment: 21 Wound Status Wound Number: 4 Primary Etiology: Diabetic Wound/Ulcer of the Lower Extremity Wound Location: Left, Distal, Medial Lower Leg Secondary Venous Leg Ulcer Etiology: Wounding Event: Gradually Appeared Wound Status: Open Date Acquired: 02/11/2023 Comorbid Anemia, Lymphedema, Hypertension, Cirrhosis , Type II Weeks Of Treatment: 14 History: Diabetes Clustered Wound: No Photos Wound Measurements Length: (cm) 0.4 Width: (cm) 0.5 Depth: (cm) 0.1 Area: (cm) 0.157 Volume: (cm) 0.016 Sacra, [name] [name] ([id number]) % Reduction in Area: 59.2% % Reduction in Volume: 57.9% Epithelialization: Small (1-33%) Tunneling: No Undermining: No [id number] Page 12 of 16 Wound Description Classification: Grade 1 Wound Margin: Distinct, outline attached Exudate Amount: Medium Exudate Type: Serosanguineous Exudate  Color: red, brown Foul Odor After Cleansing: No Slough/Fibrino Yes Wound Bed Granulation Amount: Small (1-33%) Exposed Structure Granulation Quality: Hyper-granulation Fascia Exposed: No Necrotic Amount: Large (67-100%) Fat Layer (Subcutaneous Tissue) Exposed: Yes Necrotic Quality: Adherent Slough Tendon Exposed: No Muscle Exposed: No Joint Exposed: No Bone Exposed: No Periwound Skin Texture Texture Color No Abnormalities Noted: No No Abnormalities Noted: No Callus: No Atrophie Blanche: No Crepitus: No Cyanosis: No Excoriation: No Ecchymosis: No Induration: No Erythema: No Rash: No Hemosiderin Staining: No Scarring: No Mottled: No Pallor: No Moisture Rubor: No No Abnormalities Noted: No Dry / Scaly: No Temperature / Pain Maceration: No Temperature: No Abnormality Treatment Notes Wound #4 (Lower Leg) Wound Laterality: Left, Medial, Distal Cleanser Soap and Water Discharge Instruction:  [id number] Date of Birth/Sex: Treating RN: May 15, 1956 (67 y.o. [name] Primary Care Perina Salvaggio: [place] Mon Other Clinician: Referring Trenna Kiely: Treating Trinidi Toppins/Extender: [name] in Treatment: 21 Wound Status Wound Number: 6 Primary Diabetic Wound/Ulcer of the Lower Extremity Etiology: Wound Location: Left, Proximal, Posterior  Lower Leg Wound Status: Open Wounding Event: Blister Comorbid Anemia, Lymphedema, Hypertension, Cirrhosis , Type II Date Acquired: 02/19/2023 History: Diabetes Weeks Of Treatment: 12 Clustered Wound: No Photos [name] [name], [name] [name] ([id number]) [id number] Page 15 of 16 Wound Measurements Length: (cm) 0.4 Width: (cm) 0.4 Depth: (cm) 0.1 Area: (cm) 0.126 Volume: (cm) 0.013 % Reduction in Area: 92.4% % Reduction in Volume: 92.1% Epithelialization: Small (1-33%) Tunneling: No Undermining: No Wound Description Classification: Grade 1 Wound Margin: Distinct, outline attached Exudate Amount: Medium Exudate Type: Serosanguineous Exudate Color: red, brown Foul Odor After Cleansing: No Slough/Fibrino Yes Wound Bed Granulation Amount: None Present (0%) Exposed Structure Necrotic Amount: Large (67-100%) Fascia Exposed: No Necrotic Quality: Adherent Slough Fat Layer (Subcutaneous Tissue) Exposed: Yes Tendon Exposed: No Muscle Exposed: No Joint Exposed: No Bone Exposed: No Periwound Skin Texture Texture Color No Abnormalities Noted: No No Abnormalities Noted: No Callus: No Atrophie Blanche: No Crepitus: No Cyanosis: No Excoriation: No Ecchymosis: No Induration: No Erythema: No Rash: No Hemosiderin Staining: No Scarring: No Mottled: No Pallor: No Moisture Rubor: No No Abnormalities Noted: No Dry / Scaly: No Temperature / Pain Maceration: No Temperature: No Abnormality Treatment Notes Wound #6 (Lower Leg) Wound Laterality: Left, Posterior, Proximal Cleanser Soap and Water Discharge Instruction: May shower and wash wound with dial antibacterial soap and water prior to dressing change. Peri-Wound Care Triamcinolone 15 (g) Discharge Instruction: Use triamcinolone apply to entire both legs. around the wounds. Topical Primary Dressing Hydrofera Blue Ready Transfer Foam, 2.5x2.5 (in/in) Discharge Instruction: Apply directly to wound bed as  directed Santyl Ointment Discharge Instruction: Apply nickel thick amount to wound bed as instructed Secondary Dressing Woven Gauze Sponge, Non-Sterile 4x4 in Discharge Instruction: Apply over primary dressing as directed. [name] [name], [name] [name] ([id number]) [id number] Page 16 of 16 Secured With [employer], 4.5x3.1 (in/yd) Discharge Instruction: Secure with Kerlix as directed. 41M Medipore H Soft Cloth Surgical T ape, 4 x 10 (in/yd) Discharge Instruction: Secure with tape as directed. Compression Wrap Compression Stockings Add-Ons Electronic Signature(s) Signed: 05/22/2023 4:07:14 PM By: Brenton Grills Entered By: Brenton Grills on 05/20/2023 05:30:24 -------------------------------------------------------------------------------- Vitals Details Patient Name: Date of Service: [name] [name] [name], NO RRIS [name]. 05/20/2023 8:15 A [name] Medical Record Number: [id number] Patient Account Number: [id number] Date of Birth/Sex: Treating RN: Sep 20, 1955 (67 y.o. [name] Primary Care Aayansh Codispoti: Cranford Mon Other Clinician: Referring Josuel Koeppen: Treating Hether Anselmo/Extender: [name] in Treatment: 21 Vital Signs Time Taken: 08:17 Temperature (F): 97.9 Height (in): 72 Pulse (bpm): 52 Weight (lbs): 245 Respiratory Rate (breaths/min): 18 Body Mass Index (BMI): 33.2 Blood Pressure (mmHg): 115/62 Capillary Blood Glucose (mg/dl): 425 Reference Range: 80 - 120 mg / dl Electronic Signature(s) Signed: 05/22/2023 4:07:14 PM By: Brenton Grills Entered By: Brenton Grills on 05/20/2023 05:18:17  Apply nickel thick amount to wound bed as instructed Secondary Dressing Woven Gauze Sponge, Non-Sterile 4x4 in Discharge Instruction: Apply over primary dressing as directed. Secured With [employer], 4.5x3.1 (in/yd) Discharge Instruction: Secure with Kerlix as directed. 57M Medipore H Soft Cloth Surgical T ape, 4 x 10 (in/yd) Discharge Instruction: Secure with tape as directed. Compression Wrap Compression Stockings Add-Ons Electronic Signature(s) Signed: 05/20/2023 4:41:28 PM By: [name] MD [place], Signed: 05/20/2023 4:41:28 PM  By: [name] MD Milus Height ([id number]) [id number] Page 8 of 16 Entered By: [name] on 05/20/2023 06:08:31 -------------------------------------------------------------------------------- Multi-Disciplinary Care Plan Details Patient Name: Date of Service: [name] [name]. 05/20/2023 8:15 A [name] Medical Record Number: [id number] Patient Account Number: [id number] Date of Birth/Sex: Treating RN: 1956/07/08 (67 y.o. [name] Primary Care Orvile Corona: [place]ford Mon Other Clinician: Referring Herberta Pickron: Treating Danyell Awbrey/Extender: [name] in Treatment: 21 Active Inactive Pain, Acute or Chronic Nursing Diagnoses: Pain, acute or chronic: actual or potential Potential alteration in comfort, pain Goals: Patient will verbalize adequate pain control and receive pain control interventions during procedures as needed Date Initiated: 12/24/2022 Target Resolution Date: 06/28/2023 Goal Status: Active Patient/caregiver will verbalize comfort level met Date Initiated: 12/24/2022 Date Inactivated: 01/31/2023 Target Resolution Date: 02/14/2023 Goal Status: Met Interventions: Encourage patient to take pain medications as prescribed Provide education on pain management Treatment Activities: Administer pain control measures as ordered : 12/24/2022 Notes: Electronic Signature(s) Signed: 05/22/2023 4:07:14 PM By: Brenton Grills Entered By: Brenton Grills on 05/20/2023 05:31:37 -------------------------------------------------------------------------------- Pain Assessment Details Patient Name: Date of Service: [name] [name] RRIS [name]. 05/20/2023 8:15 A [name] Medical Record Number: [id number] Patient Account Number: [id number] Date of Birth/Sex: Treating RN: May 22, 1956 (67 y.o. [name] Primary Care Monifa Blanchette: Cranford Mon Other Clinician: Referring Sanaia Jasso: Treating Chantil Bari/Extender: [name] in  Treatment: 21 Active Problems Location of Pain Severity and Description of Pain Patient Has Paino No Site Locations [place] ([id number]) [id number] Page 9 of 16 Pain Management and Medication Current Pain Management: Electronic Signature(s) Signed: 05/22/2023 4:07:14 PM By: Brenton Grills Entered By: Brenton Grills on 05/20/2023 05:18:26 -------------------------------------------------------------------------------- Patient/Caregiver Education Details Patient Name: Date of Service: [name] [name]. 10/14/2024andnbsp8:15 A [name] Medical Record Number: [id number] Patient Account Number: [id number] Date of Birth/Gender: Treating RN: 23-Aug-1955 (67 y.o. [name] Primary Care Physician: Cranford Mon Other Clinician: Referring Physician: Treating Physician/Extender: [name] in Treatment: 21 Education Assessment Education Provided To: Patient and Caregiver Education Topics Provided Wound/Skin Impairment: Methods: Explain/Verbal Responses: State content correctly Electronic Signature(s) Signed: 05/22/2023 4:07:14 PM By: Brenton Grills Entered By: Brenton Grills on 05/20/2023 05:31:55 -------------------------------------------------------------------------------- Wound Assessment Details Patient Name: Date of Service: [name] [name] RRIS [name]. 05/20/2023 8:15 A [name] Medical Record Number: [id number] Patient Account Number: [id number] Date of Birth/Sex: Treating RN: 01/12/1956 (67 y.o. [name] Primary Care Audre Cenci: Cranford Mon Other Clinician: Referring Jaevin Medearis: Treating Octa Uplinger/Extender: Kaled, Allende [place] ([id number]) [id number] Page 10 of 16 Weeks in Treatment: 21 Wound Status Wound Number: 3 Primary Diabetic Wound/Ulcer of the Lower Extremity Etiology: Wound Location: Right, Proximal, Anterior Lower Leg Wound Status: Open Wounding Event: Gradually  Appeared Comorbid Anemia, Lymphedema, Hypertension, Cirrhosis , Type II Date Acquired: 01/25/2023 History: Diabetes Weeks Of Treatment: 15 Clustered Wound: No Photos Wound Measurements Length: (cm) Width: (cm) Depth: (cm) Clustered Quantity: Area: (cm) Volume: (cm) 1 % Reduction in Area: -784.5% 0.8 %  of Service: [name] [name]. 05/20/2023 8:15 A [name] Medical Record Number: [id number] Patient Account Number: [id number] Date of Birth/Sex: Treating RN: Dec 24, 1955 (67 y.o. [name]) Primary Care Ward Boissonneault: Cranford Mon Other Clinician: Referring Andrew Blasius: Treating Gwendolyn Nishi/Extender: [name] in Treatment: 21 Vital Signs Height(in): 72 Capillary Blood Glucose(mg/dl): 562 Weight(lbs): 130 Pulse(bpm): 52 Body Mass Index(BMI): 33.2 Blood Pressure(mmHg): 115/62 Temperature(F): 97.9 Respiratory Rate(breaths/min): 18 [3:Photos:] Right, Proximal, Anterior Lower Leg Left, Distal, Medial Lower Leg Right, Distal, Lateral Lower Leg Wound Location: Gradually Appeared Gradually Appeared Blister Wounding Event: Diabetic Wound/Ulcer of the Lower Diabetic Wound/Ulcer of the Lower Diabetic Wound/Ulcer of the Lower Primary Etiology: Extremity Extremity Extremity N/A Venous Leg Ulcer N/A Secondary Etiology: Anemia, Lymphedema, Hypertension, Anemia, Lymphedema, Hypertension, Anemia, Lymphedema, Hypertension, Comorbid History: Cirrhosis , Type II Diabetes Cirrhosis , Type II Diabetes Cirrhosis , Type II Diabetes 01/25/2023 02/11/2023 02/19/2023 Date Acquired: 15 14 12  Weeks of Treatment: Open Open Open Wound Status: No No No Wound Recurrence: No No Yes Clustered Wound: 3  N/A 1 Clustered Quantity: 1x0.8x0.1 0.4x0.5x0.1 0.8x0.6x0.1 Measurements L x W x D (cm) 0.628 0.157 0.377 A (cm) : rea 0.063 0.016 0.038 Volume (cm) : -784.50% 59.20% 40.70% % Reduction in Area: -350.00% 57.90% 40.60% % Reduction in VolumeKAVISH, [name] [name] ([id number]) [id number] Page 5 of 16 Grade 1 Grade 1 Grade 1 Classification: Medium Medium Medium Exudate A mount: Serosanguineous Serosanguineous Serosanguineous Exudate Type: red, brown red, brown red, brown Exudate Color: Distinct, outline attached Distinct, outline attached Distinct, outline attached Wound Margin: None Present (0%) Small (1-33%) Large (67-100%) Granulation A mount: N/A Hyper-granulation Red, Pink Granulation Quality: Large (67-100%) Large (67-100%) Small (1-33%) Necrotic A mount: Fat Layer (Subcutaneous Tissue): Yes Fat Layer (Subcutaneous Tissue): Yes Fat Layer (Subcutaneous Tissue): Yes Exposed Structures: Fascia: No Fascia: No Fascia: No Tendon: No Tendon: No Tendon: No Muscle: No Muscle: No Muscle: No Joint: No Joint: No Joint: No Bone: No Bone: No Bone: No Small (1-33%) Small (1-33%) Small (1-33%) Epithelialization: Excoriation: No Excoriation: No Excoriation: No Periwound Skin Texture: Induration: No Induration: No Induration: No Callus: No Callus: No Callus: No Crepitus: No Crepitus: No Crepitus: No Rash: No Rash: No Rash: No Scarring: No Scarring: No Scarring: No Maceration: No Maceration: No Maceration: No Periwound Skin Moisture: Dry/Scaly: No Dry/Scaly: No Dry/Scaly: No Atrophie Blanche: No Atrophie Blanche: No Atrophie Blanche: No Periwound Skin Color: Cyanosis: No Cyanosis: No Cyanosis: No Ecchymosis: No Ecchymosis: No Ecchymosis: No Erythema: No Erythema: No Erythema: No Hemosiderin Staining: No Hemosiderin Staining: No Hemosiderin Staining: No Mottled: No Mottled: No Mottled: No Pallor: No Pallor: No Pallor:  No Rubor: No Rubor: No Rubor: No No Abnormality No Abnormality No Abnormality Temperature: Wound Number: 6 N/A N/A Photos: N/A N/A Left, Proximal, Posterior Lower Leg N/A N/A Wound Location: Blister N/A N/A Wounding Event: Diabetic Wound/Ulcer of the Lower N/A N/A Primary Etiology: Extremity N/A N/A N/A Secondary Etiology: Anemia, Lymphedema, Hypertension, N/A N/A Comorbid History: Cirrhosis , Type II Diabetes 02/19/2023 N/A N/A Date Acquired: 12 N/A N/A Weeks of Treatment: Open N/A N/A Wound Status: No N/A N/A Wound Recurrence: No N/A N/A Clustered Wound: N/A N/A N/A Clustered Quantity: 0.4x0.4x0.1 N/A N/A Measurements L x W x D (cm) 0.126 N/A N/A A (cm) : rea 0.013 N/A N/A Volume (cm) : 92.40% N/A N/A % Reduction in A rea: 92.10% N/A N/A % Reduction in Volume: Grade 1 N/A N/A Classification: Medium N/A N/A Exudate A mount: Serosanguineous N/A N/A Exudate Type: red, brown  Reduction in Volume: -350% 0.1 Epithelialization: Small (1-33%) 3 Tunneling: No 0.628 Undermining: No 0.063 Wound Description Classification: Grade 1 Wound Margin: Distinct, outline attached Exudate Amount: Medium Exudate Type: Serosanguineous Exudate Color: red, brown Foul Odor After Cleansing: No Slough/Fibrino Yes Wound Bed Granulation Amount: None Present (0%) Exposed Structure Necrotic Amount: Large (67-100%) Fascia Exposed: No Necrotic Quality: Adherent Slough Fat Layer (Subcutaneous Tissue) Exposed: Yes Tendon Exposed: No Muscle Exposed: No Joint Exposed: No Bone Exposed: No Periwound Skin Texture Texture Color No Abnormalities Noted: No No Abnormalities Noted: No Callus: No Atrophie Blanche: No Crepitus: No Cyanosis: No Excoriation: No Ecchymosis: No Induration: No Erythema: No Rash: No Hemosiderin Staining: No Scarring: No Mottled: No Pallor: No Moisture Rubor: No No Abnormalities Noted: No Dry / Scaly: No Temperature / Pain Maceration: No Temperature: No Abnormality Treatment Notes Wound #3 (Lower Leg) Wound Laterality: Right, Anterior, Proximal Cleanser Soap and Water Discharge Instruction: May shower and wash wound with dial antibacterial soap and water prior to dressing change. Peri-Wound Care Triamcinolone 15 (g) Discharge Instruction: Use triamcinolone apply to entire both legs. around the wounds. IVA, POSTEN ([id number]) [id number] Page 11 of 16 Topical Primary Dressing Hydrofera Blue Ready Transfer Foam, 2.5x2.5 (in/in) Discharge Instruction: Apply directly to wound bed as directed Santyl  Ointment Discharge Instruction: Apply nickel thick amount to wound bed as instructed Secondary Dressing Woven Gauze Sponge, Non-Sterile 4x4 in Discharge Instruction: Apply over primary dressing as directed. Secured With [employer], 4.5x3.1 (in/yd) Discharge Instruction: Secure with Kerlix as directed. 13M Medipore H Soft Cloth Surgical T ape, 4 x 10 (in/yd) Discharge Instruction: Secure with tape as directed. Compression Wrap Compression Stockings Add-Ons Electronic Signature(s) Signed: 05/22/2023 4:07:14 PM By: Brenton Grills Entered By: Brenton Grills on 05/20/2023 05:27:59 -------------------------------------------------------------------------------- Wound Assessment Details Patient Name: Date of Service: [name] [name] RRIS [name]. 05/20/2023 8:15 A [name] Medical Record Number: [id number] Patient Account Number: [id number] Date of Birth/Sex: Treating RN: Jul 20, 1956 (67 y.o. [name] Primary Care Shadavia Dampier: Cranford Mon Other Clinician: Referring Morgann Woodburn: Treating Shamicka Inga/Extender: [name] in Treatment: 21 Wound Status Wound Number: 4 Primary Etiology: Diabetic Wound/Ulcer of the Lower Extremity Wound Location: Left, Distal, Medial Lower Leg Secondary Venous Leg Ulcer Etiology: Wounding Event: Gradually Appeared Wound Status: Open Date Acquired: 02/11/2023 Comorbid Anemia, Lymphedema, Hypertension, Cirrhosis , Type II Weeks Of Treatment: 14 History: Diabetes Clustered Wound: No Photos Wound Measurements Length: (cm) 0.4 Width: (cm) 0.5 Depth: (cm) 0.1 Area: (cm) 0.157 Volume: (cm) 0.016 Sacra, [name] [name] ([id number]) % Reduction in Area: 59.2% % Reduction in Volume: 57.9% Epithelialization: Small (1-33%) Tunneling: No Undermining: No [id number] Page 12 of 16 Wound Description Classification: Grade 1 Wound Margin: Distinct, outline attached Exudate Amount: Medium Exudate Type: Serosanguineous Exudate  Color: red, brown Foul Odor After Cleansing: No Slough/Fibrino Yes Wound Bed Granulation Amount: Small (1-33%) Exposed Structure Granulation Quality: Hyper-granulation Fascia Exposed: No Necrotic Amount: Large (67-100%) Fat Layer (Subcutaneous Tissue) Exposed: Yes Necrotic Quality: Adherent Slough Tendon Exposed: No Muscle Exposed: No Joint Exposed: No Bone Exposed: No Periwound Skin Texture Texture Color No Abnormalities Noted: No No Abnormalities Noted: No Callus: No Atrophie Blanche: No Crepitus: No Cyanosis: No Excoriation: No Ecchymosis: No Induration: No Erythema: No Rash: No Hemosiderin Staining: No Scarring: No Mottled: No Pallor: No Moisture Rubor: No No Abnormalities Noted: No Dry / Scaly: No Temperature / Pain Maceration: No Temperature: No Abnormality Treatment Notes Wound #4 (Lower Leg) Wound Laterality: Left, Medial, Distal Cleanser Soap and Water Discharge Instruction:

## 2023-05-22 NOTE — Progress Notes (Signed)
of Birth/Sex: Treating RN: Jun 03, 1956 (67 y.o. M) Primary Care Provider: Cranford Mon Other Clinician: Referring Provider: Treating Provider/Extender: [name] in Treatment: 21 Constitutional Sitting or standing Blood Pressure is within target range for  patient.. Pulse regular and within target range for patient.Marland Kitchen Respirations regular, non-labored and within target range.. Temperature is normal and within the target range for the patient.Marland Kitchen Appears in no distress. Notes Wound exam; the areas on the left leg are smaller. This includes the biopsy site on the left distal area. The major areas on the right anterior there is also an area on the right lateral. Furthermore his wife is concerned about a small erythematous area just medial to the major wound on the left right side. 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 Electronic Signature(s) Signed: 05/20/2023 4:41:28 PM By: [name] MD Entered By: [name] on 05/20/2023 09:12:30 -------------------------------------------------------------------------------- Physician Orders Details Patient Name: Date of Service: [name] 05/20/2023 8:15 A M Medical Record Number: [id number] Patient Account Number: [id number] Date of Birth/Sex: Treating RN: 11/16/1955 (67 y.o. [name] Primary Care Provider: Cranford Mon Other Clinician: Referring Provider: Treating Provider/Extender: [name] in Treatment: 21 Verbal / Phone Orders: No Diagnosis Coding Follow-up Appointments ppointment in 1 week. - Appt. has been made for 05/28/23 at 0800am. Return A ppointment in 2 weeks. - Dr. Leanord Hawking - Monday - (Front office to schedule) Return A Return appointment in 3 weeks. - Dr. Leanord Hawking - Room 9 Monday Return appointment in 1 month. - Dr. Leanord Hawking - Room 9 Monday [facility] office to schedule) Anesthetic (In clinic) Topical Lidocaine 4% applied to wound bed Bathing/ Shower/ Hygiene May shower and wash wound with soap and water. Edema Control - Lymphedema / SCD / Other Elevate legs to the level of the heart or above for 30  minutes daily and/or when sitting for 3-4 times a day throughout the day. Avoid standing for long periods of time. ASHYR, HEDGEPATH ([id number]) [id number] Page 3 of 8 Patient to wear own compression stockings every day. Exercise regularly Compression stocking or Garment 30-40 mm/Hg pressure to: - wear juxatlite HDs both legs. Wound Treatment Wound #3 - Lower Leg Wound Laterality: Right, Anterior, Proximal Cleanser: Soap and Water 1 x Per Day/30 Days Discharge Instructions: May shower and wash wound with dial antibacterial soap and water prior to dressing change. Peri-Wound Care: Triamcinolone 15 (g) 1 x Per Day/30 Days Discharge Instructions: Use triamcinolone apply to entire both legs. around the wounds. Prim Dressing: Hydrofera Blue Ready Transfer Foam, 2.5x2.5 (in/in) 1 x Per Day/30 Days ary Discharge Instructions: Apply directly to wound bed as directed Prim Dressing: Santyl Ointment 1 x Per Day/30 Days ary Discharge Instructions: Apply nickel thick amount to wound bed as instructed Secondary Dressing: Woven Gauze Sponge, Non-Sterile 4x4 in (Generic) 1 x Per Day/30 Days Discharge Instructions: Apply over primary dressing as directed. Secured With: [employer], 4.5x3.1 (in/yd) (Generic) 1 x Per Day/30 Days Discharge Instructions: Secure with Kerlix as directed. Secured With: 57M Medipore H Soft Cloth Surgical T ape, 4 x 10 (in/yd) (Generic) 1 x Per Day/30 Days Discharge Instructions: Secure with tape as directed. Wound #4 - Lower Leg Wound Laterality: Left, Medial, Distal Cleanser: Soap and Water 1 x Per Day/30 Days Discharge Instructions: May shower and wash wound with dial antibacterial soap and  Casimiro Needle MD Entered By: [name] on 05/20/2023 09:13:56 -------------------------------------------------------------------------------- SuperBill Details Patient Name: Date of Service: [name], [name] RRIS M. 05/20/2023 Medical Record Number:  [id number] Patient Account Number: [id number] Date of Birth/Sex: Treating RN: 17-Mar-1956 (67 y.o. 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, not elsewhere classified E11.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 other skin ulcer K74.69 Other cirrhosis of liver Facility Procedures : CPT4 Code: [id number] Description: 46962 - WOUND CARE VISIT-LEV 5 EST PT Modifier: 25 Quantity: 1 Physician Procedures : CPT4 Code Description Modifier [id number] - WC PHYS LEVEL 3 - EST PT ICD-10 Diagnosis Description I87.313 Chronic venous hypertension (idiopathic) with ulcer of bilateral lower extremity L97.818 Non-pressure chronic ulcer of other part of right  lower leg with other specified severity L97.828 Non-pressure chronic ulcer of other part of left lower leg with other specified severity Quantity: 1 Electronic Signature(s) Signed: 05/20/2023 4:41:28 PM By: [name] MD Entered By: [name] on 05/20/2023 09:14:16  water prior to dressing change. Peri-Wound Care: Triamcinolone 15 (g) 1 x Per Day/30 Days Discharge Instructions: Use triamcinolone apply to entire both legs. around the wounds. Prim Dressing: Hydrofera Blue Ready Transfer Foam, 2.5x2.5 (in/in) 1 x Per  Day/30 Days ary Discharge Instructions: Apply directly to wound bed as directed Prim Dressing: Santyl Ointment 1 x Per Day/30 Days ary Discharge Instructions: Apply nickel thick amount to wound bed as instructed Secondary Dressing: Woven Gauze Sponge, Non-Sterile 4x4 in (Generic) 1 x Per Day/30 Days Discharge Instructions: Apply over primary dressing as directed. Secured With: [employer], 4.5x3.1 (in/yd) (Generic) 1 x Per Day/30 Days Discharge Instructions: Secure with Kerlix as directed. Secured With: 31M Medipore H Soft Cloth Surgical T ape, 4 x 10 (in/yd) (Generic) 1 x Per Day/30 Days Discharge Instructions: Secure with tape as directed. Wound #5 - Lower Leg Wound Laterality: Right, Lateral, Distal Cleanser: Soap and Water 1 x Per Day/30 Days Discharge Instructions: May shower and wash wound with dial antibacterial soap and water prior to dressing change. Peri-Wound Care: Triamcinolone 15 (g) 1 x Per Day/30 Days Discharge Instructions: Use triamcinolone apply to entire both legs. around the wounds. Prim Dressing: Hydrofera Blue Ready Transfer Foam, 2.5x2.5 (in/in) 1 x Per Day/30 Days ary Discharge Instructions: Apply directly to wound bed as directed Prim Dressing: Santyl Ointment 1 x Per Day/30 Days ary Discharge Instructions: Apply nickel thick amount to wound bed as instructed Secondary Dressing: Woven Gauze Sponge, Non-Sterile 4x4 in (Generic) 1 x Per Day/30 Days Discharge Instructions: Apply over primary dressing as directed. Secured With: [employer], 4.5x3.1 (in/yd) (Generic) 1 x Per Day/30 Days Discharge Instructions: Secure with Kerlix as directed. Secured With: 31M Medipore H Soft Cloth Surgical T ape, 4 x 10 (in/yd) (Generic) 1 x Per Day/30 Days Discharge Instructions: Secure with tape as directed. Wound #6 - Lower Leg Wound Laterality: Left, Posterior, Proximal Cleanser: Soap and Water 1 x Per Day/30 Days Discharge Instructions: May shower and wash wound  with dial antibacterial soap and water prior to dressing change. Peri-Wound Care: Triamcinolone 15 (g) 1 x Per Day/30 Days JOHNMARK, GEIGER ([id number]) [id number] Page 4 of 8 Discharge Instructions: Use triamcinolone apply to entire both legs. around the wounds. Prim Dressing: Hydrofera Blue Ready Transfer Foam, 2.5x2.5 (in/in) 1 x Per Day/30 Days ary Discharge Instructions: Apply directly to wound bed as directed Prim Dressing: Santyl Ointment 1 x Per Day/30 Days ary Discharge Instructions: Apply nickel thick amount to wound bed as instructed Secondary Dressing: Woven Gauze Sponge, Non-Sterile 4x4 in (Generic) 1 x Per Day/30 Days Discharge Instructions: Apply over primary dressing as directed. Secured With: [employer], 4.5x3.1 (in/yd) (Generic) 1 x Per Day/30 Days Discharge Instructions: Secure with Kerlix as directed. Secured With: 31M Medipore H Soft Cloth Surgical T ape, 4 x 10 (in/yd) (Generic) 1 x Per Day/30 Days Discharge Instructions: Secure with tape as directed. Electronic Signature(s) Signed: 05/20/2023 4:41:28 PM By: [name] MD Signed: 05/22/2023 4:07:14 PM By: Brenton Grills Entered By: Brenton Grills on 05/20/2023 08:48:05 -------------------------------------------------------------------------------- Problem List Details Patient Name: Date of Service: [name], [name] RRIS M. 05/20/2023 8:15 A M Medical Record Number: [id number] Patient Account Number: [id number] Date of Birth/Sex: Treating RN: Dec 26, 1955 (67 y.o. [name] Primary Care Provider: Cranford Mon Other Clinician: Referring Provider: Treating Provider/Extender: [name] in Treatment: 21 Active Problems ICD-10 Encounter Code Description Active Date MDM Diagnosis I87.313 Chronic venous hypertension (idiopathic) with ulcer of bilateral lower extremity 12/24/2022  of Birth/Sex: Treating RN: Jun 03, 1956 (67 y.o. M) Primary Care Provider: Cranford Mon Other Clinician: Referring Provider: Treating Provider/Extender: [name] in Treatment: 21 Constitutional Sitting or standing Blood Pressure is within target range for  patient.. Pulse regular and within target range for patient.Marland Kitchen Respirations regular, non-labored and within target range.. Temperature is normal and within the target range for the patient.Marland Kitchen Appears in no distress. Notes Wound exam; the areas on the left leg are smaller. This includes the biopsy site on the left distal area. The major areas on the right anterior there is also an area on the right lateral. Furthermore his wife is concerned about a small erythematous area just medial to the major wound on the left right side. 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 Electronic Signature(s) Signed: 05/20/2023 4:41:28 PM By: [name] MD Entered By: [name] on 05/20/2023 09:12:30 -------------------------------------------------------------------------------- Physician Orders Details Patient Name: Date of Service: [name] 05/20/2023 8:15 A M Medical Record Number: [id number] Patient Account Number: [id number] Date of Birth/Sex: Treating RN: 11/16/1955 (67 y.o. [name] Primary Care Provider: Cranford Mon Other Clinician: Referring Provider: Treating Provider/Extender: [name] in Treatment: 21 Verbal / Phone Orders: No Diagnosis Coding Follow-up Appointments ppointment in 1 week. - Appt. has been made for 05/28/23 at 0800am. Return A ppointment in 2 weeks. - Dr. Leanord Hawking - Monday - (Front office to schedule) Return A Return appointment in 3 weeks. - Dr. Leanord Hawking - Room 9 Monday Return appointment in 1 month. - Dr. Leanord Hawking - Room 9 Monday [facility] office to schedule) Anesthetic (In clinic) Topical Lidocaine 4% applied to wound bed Bathing/ Shower/ Hygiene May shower and wash wound with soap and water. Edema Control - Lymphedema / SCD / Other Elevate legs to the level of the heart or above for 30  minutes daily and/or when sitting for 3-4 times a day throughout the day. Avoid standing for long periods of time. ASHYR, [employer] ([id number]) [id number] Page 3 of 8 Patient to wear own compression stockings every day. Exercise regularly Compression stocking or Garment 30-40 mm/Hg pressure to: - wear juxatlite HDs both legs. Wound Treatment Wound #3 - Lower Leg Wound Laterality: Right, Anterior, Proximal Cleanser: Soap and Water 1 x Per Day/30 Days Discharge Instructions: May shower and wash wound with dial antibacterial soap and water prior to dressing change. Peri-Wound Care: Triamcinolone 15 (g) 1 x Per Day/30 Days Discharge Instructions: Use triamcinolone apply to entire both legs. around the wounds. Prim Dressing: Hydrofera Blue Ready Transfer Foam, 2.5x2.5 (in/in) 1 x Per Day/30 Days ary Discharge Instructions: Apply directly to wound bed as directed Prim Dressing: Santyl Ointment 1 x Per Day/30 Days ary Discharge Instructions: Apply nickel thick amount to wound bed as instructed Secondary Dressing: Woven Gauze Sponge, Non-Sterile 4x4 in (Generic) 1 x Per Day/30 Days Discharge Instructions: Apply over primary dressing as directed. Secured With: [employer], 4.5x3.1 (in/yd) (Generic) 1 x Per Day/30 Days Discharge Instructions: Secure with Kerlix as directed. Secured With: 57M Medipore H Soft Cloth Surgical T ape, 4 x 10 (in/yd) (Generic) 1 x Per Day/30 Days Discharge Instructions: Secure with tape as directed. Wound #4 - Lower Leg Wound Laterality: Left, Medial, Distal Cleanser: Soap and Water 1 x Per Day/30 Days Discharge Instructions: May shower and wash wound with dial antibacterial soap and  No Yes L97.818 Non-pressure chronic ulcer of other part of right lower  leg with other specified 12/24/2022 No Yes severity L97.828 Non-pressure chronic ulcer of other part of left lower leg with other specified 12/24/2022 No Yes severity I89.0 Lymphedema, not elsewhere classified 12/24/2022 No Yes E11.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 other skin ulcer 12/24/2022 No Yes K74.69 Other cirrhosis of liver 12/24/2022 No Yes Inactive Problems ICD-10 DAKWON, WENBERG ([id number]) [id number] Page 5 of 8 Code Description Active Date Inactive Date 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 05/20/2023 8:15 A M Medical Record Number: [id number] Patient Account Number: [id number] Date of Birth/Sex: Treating RN: 1956-03-17 (67 y.o. M) Primary Care Provider: Cranford Mon Other Clinician: Referring Provider: Treating Provider/Extender: [name] in Treatment: 21 Subjective History of Present Illness (HPI) 12/24/2022 Mr. Adolph Clutter is a 67 year old male with a past medical history of controlled type 2 diabetes on insulin, lymphedema/chronic venous insufficiency and cirrhosis that presents to the clinic for a 1 year history of nonhealing wounds to his lower extremities bilaterally. He reports the starting spontaneously. He states that he has been following with [facility] and they have been using [facility] antibiotic spray to the legs. He is not wearing compression stockings or wraps. He is also tried Medihoney and collagen in the past with little benefit. He currently denies systemic signs of infection. 5/30; patient presents for follow-up. He has been using triamcinolone cream to the periwound and Santyl to the  wound beds. There has been improvement in wound healing. He denies signs of infection and has no issues or complaints today. 6/4; both legs look considerably better. He has a wound on the right lateral lower leg and the left medial lower leg. Significant hemosiderin in the right leg less so on the left. We have been using Hydrofera Blue under compression 6/13; patient presents for follow-up. We have been using antibiotic ointment with Hydrofera Blue under compression therapy. The wounds are smaller. He has no issues or complaints today. We discussed ordering juxta lite compression garment wraps T use once his wounds heal as he has hard time putting on o compression stockings. He was agreeable with this. 6/20; patient presents for follow-up. We have been using antibiotic ointment with Hydrofera Blue under compression therapy to the lower extremities bilaterally. Wounds are smaller. He has been approved for PuraPly and was agreeable with placement today. He denies signs of infection. 6/27; patient presents for follow-up. We have been using antibiotic ointment with Hydrofera Blue under compression therapy to the left lower extremity. This wound is healed. He has his juxta lite compression wraps with him today. T the right lateral leg we have been using PuraPly under compression and that wound o is smaller today. Unfortunately he has developed skin breakdown from the Steri-Strip that was used to hold the PuraPly in place and this has created a new wound on the right leg. 7/8; patient presents for follow-up. We have been using PuraPly to the right lateral leg wound Under compression wrap. Wound is slightly smaller however original wound used for PuraPly is healed. He unfortunately developed a new wound to the left lower extremity but it looks like this was from potential scratching. He has been using his juxta lite compression here. 716; patient presents for follow-up. 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 05/20/2023 8:15 A M Medical Record Number: [id number] Patient Account Number: [id number] Date of Birth/Sex: Treating RN: 1956-03-17 (67 y.o. M) Primary Care Provider: Cranford Mon Other Clinician: Referring Provider: Treating Provider/Extender: [name] in Treatment: 21 Subjective History of Present Illness (HPI) 12/24/2022 Mr. Adolph Clutter is a 67 year old male with a past medical history of controlled type 2 diabetes on insulin, lymphedema/chronic venous insufficiency and cirrhosis that presents to the clinic for a 1 year history of nonhealing wounds to his lower extremities bilaterally. He reports the starting spontaneously. He states that he has been following with [facility] and they have been using [facility] antibiotic spray to the legs. He is not wearing compression stockings or wraps. He is also tried Medihoney and collagen in the past with little benefit. He currently denies systemic signs of infection. 5/30; patient presents for follow-up. He has been using triamcinolone cream to the periwound and Santyl to the  wound beds. There has been improvement in wound healing. He denies signs of infection and has no issues or complaints today. 6/4; both legs look considerably better. He has a wound on the right lateral lower leg and the left medial lower leg. Significant hemosiderin in the right leg less so on the left. We have been using Hydrofera Blue under compression 6/13; patient presents for follow-up. We have been using antibiotic ointment with Hydrofera Blue under compression therapy. The wounds are smaller. He has no issues or complaints today. We discussed ordering juxta lite compression garment wraps T use once his wounds heal as he has hard time putting on o compression stockings. He was agreeable with this. 6/20; patient presents for follow-up. We have been using antibiotic ointment with Hydrofera Blue under compression therapy to the lower extremities bilaterally. Wounds are smaller. He has been approved for PuraPly and was agreeable with placement today. He denies signs of infection. 6/27; patient presents for follow-up. We have been using antibiotic ointment with Hydrofera Blue under compression therapy to the left lower extremity. This wound is healed. He has his juxta lite compression wraps with him today. T the right lateral leg we have been using PuraPly under compression and that wound o is smaller today. Unfortunately he has developed skin breakdown from the Steri-Strip that was used to hold the PuraPly in place and this has created a new wound on the right leg. 7/8; patient presents for follow-up. We have been using PuraPly to the right lateral leg wound Under compression wrap. Wound is slightly smaller however original wound used for PuraPly is healed. He unfortunately developed a new wound to the left lower extremity but it looks like this was from potential scratching. He has been using his juxta lite compression here. 716; patient presents for follow-up. 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 05/20/2023 8:15 A M Medical Record Number: [id number] Patient Account Number: [id number] Date of Birth/Sex: Treating RN: 1956-03-17 (67 y.o. M) Primary Care Provider: Cranford Mon Other Clinician: Referring Provider: Treating Provider/Extender: [name] in Treatment: 21 Subjective History of Present Illness (HPI) 12/24/2022 Mr. Adolph Clutter is a 67 year old male with a past medical history of controlled type 2 diabetes on insulin, lymphedema/chronic venous insufficiency and cirrhosis that presents to the clinic for a 1 year history of nonhealing wounds to his lower extremities bilaterally. He reports the starting spontaneously. He states that he has been following with [facility] and they have been using [facility] antibiotic spray to the legs. He is not wearing compression stockings or wraps. He is also tried Medihoney and collagen in the past with little benefit. He currently denies systemic signs of infection. 5/30; patient presents for follow-up. He has been using triamcinolone cream to the periwound and Santyl to the  wound beds. There has been improvement in wound healing. He denies signs of infection and has no issues or complaints today. 6/4; both legs look considerably better. He has a wound on the right lateral lower leg and the left medial lower leg. Significant hemosiderin in the right leg less so on the left. We have been using Hydrofera Blue under compression 6/13; patient presents for follow-up. We have been using antibiotic ointment with Hydrofera Blue under compression therapy. The wounds are smaller. He has no issues or complaints today. We discussed ordering juxta lite compression garment wraps T use once his wounds heal as he has hard time putting on o compression stockings. He was agreeable with this. 6/20; patient presents for follow-up. We have been using antibiotic ointment with Hydrofera Blue under compression therapy to the lower extremities bilaterally. Wounds are smaller. He has been approved for PuraPly and was agreeable with placement today. He denies signs of infection. 6/27; patient presents for follow-up. We have been using antibiotic ointment with Hydrofera Blue under compression therapy to the left lower extremity. This wound is healed. He has his juxta lite compression wraps with him today. T the right lateral leg we have been using PuraPly under compression and that wound o is smaller today. Unfortunately he has developed skin breakdown from the Steri-Strip that was used to hold the PuraPly in place and this has created a new wound on the right leg. 7/8; patient presents for follow-up. We have been using PuraPly to the right lateral leg wound Under compression wrap. Wound is slightly smaller however original wound used for PuraPly is healed. He unfortunately developed a new wound to the left lower extremity but it looks like this was from potential scratching. He has been using his juxta lite compression here. 716; patient presents for follow-up. We  Casimiro Needle MD Entered By: [name] on 05/20/2023 09:13:56 -------------------------------------------------------------------------------- SuperBill Details Patient Name: Date of Service: [name], [name] RRIS M. 05/20/2023 Medical Record Number:  [id number] Patient Account Number: [id number] Date of Birth/Sex: Treating RN: 17-Mar-1956 (67 y.o. 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, not elsewhere classified E11.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 other skin ulcer K74.69 Other cirrhosis of liver Facility Procedures : CPT4 Code: 13086578 Description: 46962 - WOUND CARE VISIT-LEV 5 EST PT Modifier: 25 Quantity: 1 Physician Procedures : CPT4 Code Description Modifier [id number] 99213 - WC PHYS LEVEL 3 - EST PT ICD-10 Diagnosis Description I87.313 Chronic venous hypertension (idiopathic) with ulcer of bilateral lower extremity L97.818 Non-pressure chronic ulcer of other part of right  lower leg with other specified severity L97.828 Non-pressure chronic ulcer of other part of left lower leg with other specified severity Quantity: 1 Electronic Signature(s) Signed: 05/20/2023 4:41:28 PM By: [name] MD Entered By: [name] on 05/20/2023 09:14:16  Casimiro Needle MD Entered By: [name] on 05/20/2023 09:13:56 -------------------------------------------------------------------------------- SuperBill Details Patient Name: Date of Service: [name], [name] RRIS M. 05/20/2023 Medical Record Number:  [id number] Patient Account Number: [id number] Date of Birth/Sex: Treating RN: 17-Mar-1956 (67 y.o. 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, not elsewhere classified E11.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 other skin ulcer K74.69 Other cirrhosis of liver Facility Procedures : CPT4 Code: 13086578 Description: 46962 - WOUND CARE VISIT-LEV 5 EST PT Modifier: 25 Quantity: 1 Physician Procedures : CPT4 Code Description Modifier [id number] 99213 - WC PHYS LEVEL 3 - EST PT ICD-10 Diagnosis Description I87.313 Chronic venous hypertension (idiopathic) with ulcer of bilateral lower extremity L97.818 Non-pressure chronic ulcer of other part of right  lower leg with other specified severity L97.828 Non-pressure chronic ulcer of other part of left lower leg with other specified severity Quantity: 1 Electronic Signature(s) Signed: 05/20/2023 4:41:28 PM By: [name] MD Entered By: [name] on 05/20/2023 09:14:16  No Yes L97.818 Non-pressure chronic ulcer of other part of right lower  leg with other specified 12/24/2022 No Yes severity L97.828 Non-pressure chronic ulcer of other part of left lower leg with other specified 12/24/2022 No Yes severity I89.0 Lymphedema, not elsewhere classified 12/24/2022 No Yes E11.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 other skin ulcer 12/24/2022 No Yes K74.69 Other cirrhosis of liver 12/24/2022 No Yes Inactive Problems ICD-10 DAKWON, WENBERG ([id number]) [id number] Page 5 of 8 Code Description Active Date Inactive Date 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 05/20/2023 8:15 A M Medical Record Number: [id number] Patient Account Number: [id number] Date of Birth/Sex: Treating RN: 1956-03-17 (67 y.o. M) Primary Care Provider: Cranford Mon Other Clinician: Referring Provider: Treating Provider/Extender: [name] in Treatment: 21 Subjective History of Present Illness (HPI) 12/24/2022 Mr. Adolph Clutter is a 67 year old male with a past medical history of controlled type 2 diabetes on insulin, lymphedema/chronic venous insufficiency and cirrhosis that presents to the clinic for a 1 year history of nonhealing wounds to his lower extremities bilaterally. He reports the starting spontaneously. He states that he has been following with [facility] and they have been using [facility] antibiotic spray to the legs. He is not wearing compression stockings or wraps. He is also tried Medihoney and collagen in the past with little benefit. He currently denies systemic signs of infection. 5/30; patient presents for follow-up. He has been using triamcinolone cream to the periwound and Santyl to the  wound beds. There has been improvement in wound healing. He denies signs of infection and has no issues or complaints today. 6/4; both legs look considerably better. He has a wound on the right lateral lower leg and the left medial lower leg. Significant hemosiderin in the right leg less so on the left. We have been using Hydrofera Blue under compression 6/13; patient presents for follow-up. We have been using antibiotic ointment with Hydrofera Blue under compression therapy. The wounds are smaller. He has no issues or complaints today. We discussed ordering juxta lite compression garment wraps T use once his wounds heal as he has hard time putting on o compression stockings. He was agreeable with this. 6/20; patient presents for follow-up. We have been using antibiotic ointment with Hydrofera Blue under compression therapy to the lower extremities bilaterally. Wounds are smaller. He has been approved for PuraPly and was agreeable with placement today. He denies signs of infection. 6/27; patient presents for follow-up. We have been using antibiotic ointment with Hydrofera Blue under compression therapy to the left lower extremity. This wound is healed. He has his juxta lite compression wraps with him today. T the right lateral leg we have been using PuraPly under compression and that wound o is smaller today. Unfortunately he has developed skin breakdown from the Steri-Strip that was used to hold the PuraPly in place and this has created a new wound on the right leg. 7/8; patient presents for follow-up. We have been using PuraPly to the right lateral leg wound Under compression wrap. Wound is slightly smaller however original wound used for PuraPly is healed. He unfortunately developed a new wound to the left lower extremity but it looks like this was from potential scratching. He has been using his juxta lite compression here. 716; patient presents for follow-up. We

## 2023-05-27 ENCOUNTER — Encounter (INDEPENDENT_AMBULATORY_CARE_PROVIDER_SITE_OTHER): Payer: Medicare Other | Admitting: Ophthalmology

## 2023-05-27 ENCOUNTER — Ambulatory Visit (HOSPITAL_BASED_OUTPATIENT_CLINIC_OR_DEPARTMENT_OTHER): Payer: Medicare Other | Admitting: Internal Medicine

## 2023-05-27 DIAGNOSIS — E113591 Type 2 diabetes mellitus with proliferative diabetic retinopathy without macular edema, right eye: Secondary | ICD-10-CM | POA: Diagnosis not present

## 2023-05-27 DIAGNOSIS — Z794 Long term (current) use of insulin: Secondary | ICD-10-CM

## 2023-05-27 DIAGNOSIS — H35033 Hypertensive retinopathy, bilateral: Secondary | ICD-10-CM

## 2023-05-27 DIAGNOSIS — I10 Essential (primary) hypertension: Secondary | ICD-10-CM

## 2023-05-27 DIAGNOSIS — H43813 Vitreous degeneration, bilateral: Secondary | ICD-10-CM

## 2023-05-27 DIAGNOSIS — E113512 Type 2 diabetes mellitus with proliferative diabetic retinopathy with macular edema, left eye: Secondary | ICD-10-CM | POA: Diagnosis not present

## 2023-05-28 ENCOUNTER — Encounter (HOSPITAL_BASED_OUTPATIENT_CLINIC_OR_DEPARTMENT_OTHER): Payer: Medicare Other | Admitting: Internal Medicine

## 2023-05-28 DIAGNOSIS — I87313 Chronic venous hypertension (idiopathic) with ulcer of bilateral lower extremity: Secondary | ICD-10-CM | POA: Diagnosis not present

## 2023-05-28 NOTE — Progress Notes (Signed)
No Muscle: No Joint: No Bone: No Small (1-33%) N/A N/A Epithelialization: Excoriation: No N/A N/A Periwound Skin Texture: Induration: No Callus: No Crepitus: No Rash: No Scarring: No Maceration: No N/A N/A Periwound Skin Moisture: Dry/Scaly: No Atrophie Blanche: No N/A N/A Periwound Skin Color: Cyanosis: No Ecchymosis: No Erythema: No Hemosiderin Staining: No Mottled: No Pallor: No Rubor: No No Abnormality N/A N/A TemperatureTOBIE, [name] ([id number]) [id number] Page 6 of 14 [name] Notes Electronic Signature(s) Signed: 05/28/2023 4:45:38 PM By: [name] Najjar MD Entered By: [name] [name] on 05/28/2023 08:44:06 -------------------------------------------------------------------------------- Multi-Disciplinary Care Plan Details Patient Name: Date of Service: [name] [name], NO [name] M. 05/28/2023 8:00 A M Medical Record Number: [id number] Patient Account Number: [id number] Date of Birth/Sex: Treating RN: [birth year] (67  y.o. [name] [name] Primary Care [name] [name]: [name] [name] Other Clinician: Referring Valleri Hendricksen: Treating Silverio Hagan/Extender: [name] [name]: 22 Active Inactive Pain, Acute or Chronic Nursing Diagnoses: Pain, acute or chronic: actual or potential Potential alteration in comfort, pain Goals: Patient will verbalize adequate pain control and receive pain control interventions during procedures as needed Date Initiated: 12/24/2022 Target Resolution Date: 06/28/2023 Goal Status: Active Patient/caregiver will verbalize comfort level met Date Initiated: 12/24/2022 Date Inactivated: 01/31/2023 Target Resolution Date: 02/14/2023 Goal Status: Met Interventions: Encourage patient to take pain medications as prescribed Provide education on pain management [name] Activities: Administer pain control measures as ordered : 12/24/2022 Notes: Electronic Signature(s) Signed: 05/28/2023 4:45:15 PM By: [name] Mu RN Entered By: [name] [name] on 05/28/2023 08:39:17 -------------------------------------------------------------------------------- Pain Assessment Details Patient Name: Date of Service: [name] [name], NO [name] M. 05/28/2023 8:00 A M Medical Record Number: [id number] Patient Account Number: [id number] Date of Birth/Sex: Treating RN: 1955-10-23 (67 y.o. [name] [name] Primary Care Joshuajames Moehring: [name] [name] Other Clinician: Referring [name] [name]: Treating [name] [name]/Extender: [name] [name]: [address] [place], [place]UR[place] [name] [name] ([id number]) [id number] Page 7 of 14 Location of Pain Severity and Description of Pain Patient Has Paino No Site Locations Pain Management and Medication Current Pain Management: Electronic Signature(s) Signed: 05/28/2023 4:45:15 PM By: [name] Mu RN Entered By: [name] [name] on 05/28/2023  08:36:08 -------------------------------------------------------------------------------- Patient/Caregiver Education Details Patient Name: Date of Service: [name] [name] 10/22/2024andnbsp8:00 A M Medical Record Number: [id number] Patient Account Number: [id number] Date of Birth/Gender: Treating RN: 04/10/1956 (67 y.o. [name] [name] Primary Care Physician: [name] [name] Other Clinician: Referring Physician: Treating Physician/Extender: [name] [name]: 22 Education Assessment Education Provided To: Patient Education Topics Provided Wound/Skin Impairment: Methods: Explain/Verbal Responses: Reinforcements needed, State content correctly [name]) Signed: 05/28/2023 4:45:15 PM By: [name] Mu RN Entered By: [name] [name] on 05/28/2023 08:39:49 -------------------------------------------------------------------------------- Wound Assessment Details Patient Name: Date of Service: [name] [name], NO [name] M. 05/28/2023 8:00 A [name] [name] ([id number]) [id number] Page 8 of 14 Medical Record Number: [id number] Patient Account Number: [id number] Date of Birth/Sex: Treating RN: 02/25/1956 (67 y.o. [name] [name] Primary Care Nazia Rhines: [name] [name] Other Clinician: Referring [name] [name]: Treating [name] [name]/Extender: [name] [name]: 22 Wound Status Wound Number: 3 Primary Diabetic Wound/Ulcer of the Lower Extremity Etiology: Wound Location: Right, Proximal, Anterior Lower Leg Wound Status: Open Wounding Event: Gradually Appeared Comorbid Anemia, Lymphedema, Hypertension, Cirrhosis , Type II Date Acquired: 01/25/2023 History: Diabetes Weeks Of [name]: 16 Clustered Wound: No Photos Wound Measurements Length: (cm) Width: (cm) Depth: (cm) Clustered Quantity: Area: (cm) Volume: (cm) 0.8 % Reduction in Area: -342.3% 0.5 % Reduction  Surface(s) Assessment (bed, cushion, seat, etc.) INTERVENTIONS - Wound Cleansing / Measurement []  - 0 Simple Wound Cleansing - one wound X- 4 5 Complex Wound Cleansing - multiple wounds X- 1 5 Wound Imaging (photographs - any number of wounds) []  - 0 Wound Tracing (instead of photographs) []  - 0 Simple Wound Measurement - one wound X- 4 5 Complex Wound Measurement - multiple wounds INTERVENTIONS - Wound Dressings []  - 0 Small Wound Dressing one or multiple wounds X- 4 15 Medium Wound Dressing one or multiple wounds []  - 0 Large Wound Dressing one or multiple wounds []  - 0 Application of Medications - topical []  - 0 Application of Medications - injection INTERVENTIONS - Miscellaneous []  - 0 External ear exam []  - 0 Specimen Collection (cultures, biopsies, blood, body fluids, etc.) []  - 0 Specimen(s) / Culture(s) sent or taken to Lab for analysis []  - 0 Patient Transfer (multiple staff / Nurse, adult / Similar devices) []  - 0 Simple Staple / Suture removal (25 or less) []  - 0 Complex Staple / Suture removal (26 or more) []  - 0 Hypo / Hyperglycemic Management (close monitor of Blood Glucose) [name] [name], [name] [name] ([id number]) [id number] Page 3 of 14 []  - 0 Ankle / Brachial Index (ABI) - do not check if billed separately X- 1 5 Vital Signs Has the patient been seen at the hospital within the last three years: Yes Total Score: 200 Level Of Care: New/Established - Level 5 Electronic Signature(s) Signed: 05/28/2023 4:45:15 PM By: [name] Mu RN Entered By: [name] [name] on 05/28/2023 09:23:34 -------------------------------------------------------------------------------- Encounter Discharge Information Details Patient Name: Date of  Service: [name] [name], NO [name] M. 05/28/2023 8:00 A M Medical Record Number: [id number] Patient Account Number: [id number] Date of Birth/Sex: Treating RN: 14-Oct-1955 (67 y.o. [name] [name] Primary Care Gloria Lambertson: [name] [name] Other Clinician: Referring Hans Rusher: Treating Tiarrah Saville/Extender: [name] [name]: 22 Encounter Discharge Information Items Discharge Condition: Stable Ambulatory Status: Ambulatory Discharge Destination: Home Transportation: Private Auto Accompanied By: sister Schedule Follow-up Appointment: Yes Clinical Summary of Care: Patient Declined Electronic Signature(s) Signed: 05/28/2023 9:24:14 AM By: [name] Mu RN Entered By: [name] [name] on 05/28/2023 09:24:14 -------------------------------------------------------------------------------- Lower Extremity Assessment Details Patient Name: Date of Service: [name] 77, NO [name] M. 05/28/2023 8:00 A M Medical Record Number: [id number] Patient Account Number: [id number] Date of Birth/Sex: Treating RN: 1956-07-03 (67 y.o. [name] [name] Primary Care Chinedum Vanhouten: [name] [name] Other Clinician: Referring Calel Pisarski: Treating Cerise Lieber/Extender: [name] [name]: 22 Edema Assessment Assessed: [name]: No] [name]: No] Edema: [Left: No] [Right: No] Calf Left: Right: Point of Measurement: 37 cm From Medial Instep 34 cm 35 cm Ankle Left: Right: Point of Measurement: 13 cm From Medial Instep 26 cm 23.5 cm Vascular Assessment QUIENTIN, [name] [name] ([id number]) [Right:[id number] Page 4 of 14] Pulses: Dorsalis Pedis Palpable: [Left:Yes] [Right:Yes] Posterior Tibial Palpable: [Left:Yes] [Right:Yes] Extremity colors, hair growth, and conditions: Extremity Color: [Left:Hyperpigmented] [Right:Hyperpigmented] Hair Growth on Extremity: [Left:No] [Right:No] Temperature of Extremity: [Left:Warm] [Right:Warm] Capillary Refill: [Left:< 3  seconds] [Right:< 3 seconds] Dependent Rubor: [Left:No Yes] [Right:No Yes] Electronic Signature(s) Signed: 05/28/2023 4:45:15 PM By: [name] Mu RN Entered By: [name] [name] on 05/28/2023 08:22:06 -------------------------------------------------------------------------------- Multi Wound Chart Details Patient Name: Date of Service: [name] [name], NO [name] M. 05/28/2023 8:00 A M Medical Record Number: [id number] Patient Account Number: [id number] Date of Birth/Sex: Treating RN: [birth year] (67 y.o. M) Primary Care Ravonda Brecheen: [name] [name] Other Clinician: Referring Darion Milewski: Treating Hooper Petteway/Extender: Dwyane Dee,  in Volume: -121.4% 0.1  Epithelialization: Small (1-33%) 3 Tunneling: No 0.314 Undermining: No 0.031 Wound Description Classification: Grade 1 Wound Margin: Distinct, outline attached Exudate Amount: Medium Exudate Type: Serosanguineous Exudate Color: red, brown Foul Odor After Cleansing: No Slough/Fibrino Yes Wound Bed Granulation Amount: None Present (0%) Exposed Structure Necrotic Amount: Large (67-100%) Fascia Exposed: No Fat Layer (Subcutaneous Tissue) Exposed: Yes Tendon Exposed: No Muscle Exposed: No Joint Exposed: No Bone Exposed: No Periwound Skin Texture Texture Color No Abnormalities Noted: No No Abnormalities Noted: No Callus: No Atrophie Blanche: No Crepitus: No Cyanosis: No Excoriation: No Ecchymosis: No Induration: No Erythema: No Rash: No Hemosiderin Staining: No Scarring: No Mottled: No Pallor: No Moisture Rubor: No No Abnormalities Noted: No Dry / Scaly: No Temperature / Pain Maceration: No Temperature: No Abnormality [name] Notes Wound #3 (Lower Leg) Wound Laterality: Right, Anterior, Proximal Cleanser Soap and Water Discharge Instruction: May shower and wash wound with dial antibacterial soap and water prior to dressing change. [name] [name], [name] [name] ([id number]) [id number] Page 9 of 14 Peri-Wound Care Triamcinolone 15 (g) Discharge Instruction: Use triamcinolone apply to entire both legs. around the wounds. Topical Primary Dressing IODOFLEX 0.9% Cadexomer Iodine Pad 4x6 cm Discharge Instruction: Apply to wound bed as instructed Secondary Dressing Woven Gauze Sponge, Non-Sterile 4x4 in Discharge Instruction: Apply over primary dressing as directed. Secured With [employer], 4.5x3.1 (in/yd) Discharge Instruction: Secure with Kerlix as directed. 34M Medipore H Soft Cloth Surgical T ape, 4 x 10 (in/yd) Discharge Instruction: Secure with tape as directed. Compression Wrap Compression Stockings Add-Ons Electronic  Signature(s) Signed: 05/28/2023 4:45:15 PM By: [name] Mu RN Entered By: [name] [name] on 05/28/2023 08:29:49 -------------------------------------------------------------------------------- Wound Assessment Details Patient Name: Date of Service: [name] [name], NO [name] M. 05/28/2023 8:00 A M Medical Record Number: [id number] Patient Account Number: [id number] Date of Birth/Sex: Treating RN: 05-13-1956 (67 y.o. [name] [name] Primary Care Arnav Cregg: [name] [name] Other Clinician: Referring Wallice Granville: Treating Alida Greiner/Extender: [name] [name]: 22 Wound Status Wound Number: 4 Primary Etiology: Diabetic Wound/Ulcer of the Lower Extremity Wound Location: Left, Distal, Medial Lower Leg Secondary Venous Leg Ulcer Etiology: Wounding Event: Gradually Appeared Wound Status: Open Date Acquired: 02/11/2023 Comorbid Anemia, Lymphedema, Hypertension, Cirrhosis , Type II Weeks Of [name]: 15 History: Diabetes Clustered Wound: No Photos Wound Measurements Length: (cm) 0.4 Width: (cm) 0.4 Depth: (cm) 0.1 Area: (cm) 0.126 [name] [name], [name] [name] ([id number]) Volume: (cm) 0.013 % Reduction in Area: 67.3% % Reduction in Volume: 65.8% Epithelialization: Small (1-33%) Tunneling: No [id number] Page 10 of 14 Undermining: No Wound Description Classification: Grade 1 Wound Margin: Distinct, outline attached Exudate Amount: Medium Exudate Type: Serosanguineous Exudate Color: red, brown Foul Odor After Cleansing: No Slough/Fibrino Yes Wound Bed Granulation Amount: Small (1-33%) Exposed Structure Granulation Quality: Hyper-granulation Fascia Exposed: No Necrotic Amount: Large (67-100%) Fat Layer (Subcutaneous Tissue) Exposed: Yes Necrotic Quality: Adherent Slough Tendon Exposed: No Muscle Exposed: No Joint Exposed: No Bone Exposed: No Periwound Skin Texture Texture Color No Abnormalities Noted: No No Abnormalities Noted:  No Callus: No Atrophie Blanche: No Crepitus: No Cyanosis: No Excoriation: No Ecchymosis: No Induration: No Erythema: No Rash: No Hemosiderin Staining: No Scarring: No Mottled: No Pallor: No Moisture Rubor: No No Abnormalities Noted: No Dry / Scaly: No Temperature / Pain Maceration: No Temperature: No Abnormality [name] Notes Wound #4 (Lower Leg) Wound Laterality: Left, Medial, Distal Cleanser Soap and Water Discharge Instruction: May shower and wash wound with dial antibacterial soap and water prior to dressing change. Peri-Wound Care  Surface(s) Assessment (bed, cushion, seat, etc.) INTERVENTIONS - Wound Cleansing / Measurement []  - 0 Simple Wound Cleansing - one wound X- 4 5 Complex Wound Cleansing - multiple wounds X- 1 5 Wound Imaging (photographs - any number of wounds) []  - 0 Wound Tracing (instead of photographs) []  - 0 Simple Wound Measurement - one wound X- 4 5 Complex Wound Measurement - multiple wounds INTERVENTIONS - Wound Dressings []  - 0 Small Wound Dressing one or multiple wounds X- 4 15 Medium Wound Dressing one or multiple wounds []  - 0 Large Wound Dressing one or multiple wounds []  - 0 Application of Medications - topical []  - 0 Application of Medications - injection INTERVENTIONS - Miscellaneous []  - 0 External ear exam []  - 0 Specimen Collection (cultures, biopsies, blood, body fluids, etc.) []  - 0 Specimen(s) / Culture(s) sent or taken to Lab for analysis []  - 0 Patient Transfer (multiple staff / Nurse, adult / Similar devices) []  - 0 Simple Staple / Suture removal (25 or less) []  - 0 Complex Staple / Suture removal (26 or more) []  - 0 Hypo / Hyperglycemic Management (close monitor of Blood Glucose) [name] [name], [name] [name] ([id number]) [id number] Page 3 of 14 []  - 0 Ankle / Brachial Index (ABI) - do not check if billed separately X- 1 5 Vital Signs Has the patient been seen at the hospital within the last three years: Yes Total Score: 200 Level Of Care: New/Established - Level 5 Electronic Signature(s) Signed: 05/28/2023 4:45:15 PM By: [name] Mu RN Entered By: [name] [name] on 05/28/2023 09:23:34 -------------------------------------------------------------------------------- Encounter Discharge Information Details Patient Name: Date of  Service: [name] [name], NO [name] M. 05/28/2023 8:00 A M Medical Record Number: [id number] Patient Account Number: [id number] Date of Birth/Sex: Treating RN: 14-Oct-1955 (67 y.o. [name] [name] Primary Care Gloria Lambertson: [name] [name] Other Clinician: Referring Hans Rusher: Treating Tiarrah Saville/Extender: [name] [name]: 22 Encounter Discharge Information Items Discharge Condition: Stable Ambulatory Status: Ambulatory Discharge Destination: Home Transportation: Private Auto Accompanied By: sister Schedule Follow-up Appointment: Yes Clinical Summary of Care: Patient Declined Electronic Signature(s) Signed: 05/28/2023 9:24:14 AM By: [name] Mu RN Entered By: [name] [name] on 05/28/2023 09:24:14 -------------------------------------------------------------------------------- Lower Extremity Assessment Details Patient Name: Date of Service: [name] 77, NO [name] M. 05/28/2023 8:00 A M Medical Record Number: [id number] Patient Account Number: [id number] Date of Birth/Sex: Treating RN: 1956-07-03 (67 y.o. [name] [name] Primary Care Chinedum Vanhouten: [name] [name] Other Clinician: Referring Calel Pisarski: Treating Cerise Lieber/Extender: [name] [name]: 22 Edema Assessment Assessed: [name]: No] [name]: No] Edema: [Left: No] [Right: No] Calf Left: Right: Point of Measurement: 37 cm From Medial Instep 34 cm 35 cm Ankle Left: Right: Point of Measurement: 13 cm From Medial Instep 26 cm 23.5 cm Vascular Assessment QUIENTIN, [name] [name] ([id number]) [Right:[id number] Page 4 of 14] Pulses: Dorsalis Pedis Palpable: [Left:Yes] [Right:Yes] Posterior Tibial Palpable: [Left:Yes] [Right:Yes] Extremity colors, hair growth, and conditions: Extremity Color: [Left:Hyperpigmented] [Right:Hyperpigmented] Hair Growth on Extremity: [Left:No] [Right:No] Temperature of Extremity: [Left:Warm] [Right:Warm] Capillary Refill: [Left:< 3  seconds] [Right:< 3 seconds] Dependent Rubor: [Left:No Yes] [Right:No Yes] Electronic Signature(s) Signed: 05/28/2023 4:45:15 PM By: [name] Mu RN Entered By: [name] [name] on 05/28/2023 08:22:06 -------------------------------------------------------------------------------- Multi Wound Chart Details Patient Name: Date of Service: [name] [name], NO [name] M. 05/28/2023 8:00 A M Medical Record Number: [id number] Patient Account Number: [id number] Date of Birth/Sex: Treating RN: [birth year] (67 y.o. M) Primary Care Ravonda Brecheen: [name] [name] Other Clinician: Referring Darion Milewski: Treating Hooper Petteway/Extender: Dwyane Dee,  Surface(s) Assessment (bed, cushion, seat, etc.) INTERVENTIONS - Wound Cleansing / Measurement []  - 0 Simple Wound Cleansing - one wound X- 4 5 Complex Wound Cleansing - multiple wounds X- 1 5 Wound Imaging (photographs - any number of wounds) []  - 0 Wound Tracing (instead of photographs) []  - 0 Simple Wound Measurement - one wound X- 4 5 Complex Wound Measurement - multiple wounds INTERVENTIONS - Wound Dressings []  - 0 Small Wound Dressing one or multiple wounds X- 4 15 Medium Wound Dressing one or multiple wounds []  - 0 Large Wound Dressing one or multiple wounds []  - 0 Application of Medications - topical []  - 0 Application of Medications - injection INTERVENTIONS - Miscellaneous []  - 0 External ear exam []  - 0 Specimen Collection (cultures, biopsies, blood, body fluids, etc.) []  - 0 Specimen(s) / Culture(s) sent or taken to Lab for analysis []  - 0 Patient Transfer (multiple staff / Nurse, adult / Similar devices) []  - 0 Simple Staple / Suture removal (25 or less) []  - 0 Complex Staple / Suture removal (26 or more) []  - 0 Hypo / Hyperglycemic Management (close monitor of Blood Glucose) [name] [name], [name] [name] ([id number]) [id number] Page 3 of 14 []  - 0 Ankle / Brachial Index (ABI) - do not check if billed separately X- 1 5 Vital Signs Has the patient been seen at the hospital within the last three years: Yes Total Score: 200 Level Of Care: New/Established - Level 5 Electronic Signature(s) Signed: 05/28/2023 4:45:15 PM By: [name] Mu RN Entered By: [name] [name] on 05/28/2023 09:23:34 -------------------------------------------------------------------------------- Encounter Discharge Information Details Patient Name: Date of  Service: [name] [name], NO [name] M. 05/28/2023 8:00 A M Medical Record Number: [id number] Patient Account Number: [id number] Date of Birth/Sex: Treating RN: 14-Oct-1955 (67 y.o. [name] [name] Primary Care Gloria Lambertson: [name] [name] Other Clinician: Referring Hans Rusher: Treating Tiarrah Saville/Extender: [name] [name]: 22 Encounter Discharge Information Items Discharge Condition: Stable Ambulatory Status: Ambulatory Discharge Destination: Home Transportation: Private Auto Accompanied By: sister Schedule Follow-up Appointment: Yes Clinical Summary of Care: Patient Declined Electronic Signature(s) Signed: 05/28/2023 9:24:14 AM By: [name] Mu RN Entered By: [name] [name] on 05/28/2023 09:24:14 -------------------------------------------------------------------------------- Lower Extremity Assessment Details Patient Name: Date of Service: [name] 77, NO [name] M. 05/28/2023 8:00 A M Medical Record Number: [id number] Patient Account Number: [id number] Date of Birth/Sex: Treating RN: 1956-07-03 (67 y.o. [name] [name] Primary Care Chinedum Vanhouten: [name] [name] Other Clinician: Referring Calel Pisarski: Treating Cerise Lieber/Extender: [name] [name]: 22 Edema Assessment Assessed: [name]: No] [name]: No] Edema: [Left: No] [Right: No] Calf Left: Right: Point of Measurement: 37 cm From Medial Instep 34 cm 35 cm Ankle Left: Right: Point of Measurement: 13 cm From Medial Instep 26 cm 23.5 cm Vascular Assessment QUIENTIN, [name] [name] ([id number]) [Right:[id number] Page 4 of 14] Pulses: Dorsalis Pedis Palpable: [Left:Yes] [Right:Yes] Posterior Tibial Palpable: [Left:Yes] [Right:Yes] Extremity colors, hair growth, and conditions: Extremity Color: [Left:Hyperpigmented] [Right:Hyperpigmented] Hair Growth on Extremity: [Left:No] [Right:No] Temperature of Extremity: [Left:Warm] [Right:Warm] Capillary Refill: [Left:< 3  seconds] [Right:< 3 seconds] Dependent Rubor: [Left:No Yes] [Right:No Yes] Electronic Signature(s) Signed: 05/28/2023 4:45:15 PM By: [name] Mu RN Entered By: [name] [name] on 05/28/2023 08:22:06 -------------------------------------------------------------------------------- Multi Wound Chart Details Patient Name: Date of Service: [name] [name], NO [name] M. 05/28/2023 8:00 A M Medical Record Number: [id number] Patient Account Number: [id number] Date of Birth/Sex: Treating RN: [birth year] (67 y.o. M) Primary Care Ravonda Brecheen: [name] [name] Other Clinician: Referring Darion Milewski: Treating Hooper Petteway/Extender: Dwyane Dee,  [name] [name], [name] [name] ([id number]) [id number] Page 1 of 14 Visit Report for 05/28/2023 Arrival Information Details Patient Name: Date of Service: [name] [name]. 05/28/2023 8:00 A M Medical Record Number: [id number] Patient Account Number: [id number] Date of Birth/Sex: Treating RN: 11/13/1955 (67 y.o. [name] [name] Primary Care Tommie Dejoseph: [name] [name] Other Clinician: Referring Keymon Mcelroy: Treating Paw Karstens/Extender: [name] [name]: 22 Visit Information History Since Last Visit Added or deleted any medications: No Patient Arrived: Ambulatory Any new allergies or adverse reactions: No Arrival Time: 08:00 Had a fall or experienced change in No Accompanied By: sister activities of daily living that may affect Transfer Assistance: Manual risk of falls: Patient Identification Verified: Yes Signs or symptoms of abuse/neglect since last visito No Secondary Verification Process Completed: Yes Hospitalized since last visit: No Patient Requires Transmission-Based No Implantable device outside of the clinic excluding No Precautions: cellular tissue based products placed in the center Patient Has Alerts: Yes since last visit: Patient Alerts: 10/23 ABI L0.94 R1 VVS Has Dressing in Place as Prescribed: Yes 10/23 TBI L1.2 R0.88 VVS Pain Present Now: No Electronic Signature(s) Signed: 05/28/2023 9:22:53 AM By: [name] Mu RN Entered By: [name] [name] on 05/28/2023 09:22:52 -------------------------------------------------------------------------------- Clinic Level of Care Assessment Details Patient Name: Date of Service: [name] 37, NO [name] M. 05/28/2023 8:00 A M Medical Record Number: [id number] Patient Account Number: [id number] Date of Birth/Sex: Treating RN: 04-Sep-1955 (67 y.o. [name] [name] Primary Care Lyle Niblett: [name] [name] Other Clinician: Referring Jashaun Penrose: Treating Crystin Lechtenberg/Extender: [name] [name]: 22 Clinic Level of Care Assessment Items TOOL 4 Quantity Score X- 1 0 Use when only an EandM is performed on FOLLOW-UP visit ASSESSMENTS - Nursing Assessment / Reassessment X- 1 10 Reassessment of Co-morbidities (includes updates in patient status) X- 1 5 Reassessment of Adherence to [name] Plan ASSESSMENTS - Wound and Skin A ssessment / Reassessment []  - 0 Simple Wound Assessment / Reassessment - one wound X- 4 5 Complex Wound Assessment / Reassessment - multiple wounds []  - 0 Dermatologic / Skin Assessment (not related to wound area) ASSESSMENTS - Focused Assessment X- 2 5 Circumferential Edema Measurements - multi extremities []  - 0 Nutritional Assessment / Counseling / Intervention [name] [name], [name] [name] ([id number]) [id number] Page 2 of 14 []  - 0 Lower Extremity Assessment (monofilament, tuning fork, pulses) []  - 0 Peripheral Arterial Disease Assessment (using hand held doppler) ASSESSMENTS - Ostomy and/or Continence Assessment and Care []  - 0 Incontinence Assessment and Management []  - 0 Ostomy Care Assessment and Management (repouching, etc.) PROCESS - Coordination of Care []  - 0 Simple Patient / Family Education for ongoing care X- 1 20 Complex (extensive) Patient / Family Education for ongoing care X- 1 10 Staff obtains Chiropractor, Records, T Results / Process Orders est []  - 0 Staff telephones HHA, Nursing Homes / Clarify orders / etc []  - 0 Routine Transfer to another Facility (non-emergent condition) []  - 0 Routine Hospital Admission (non-emergent condition) []  - 0 New Admissions / Manufacturing engineer / Ordering NPWT Apligraf, etc. , []  - 0 Emergency Hospital Admission (emergent condition) []  - 0 Simple Discharge Coordination X- 1 15 Complex (extensive) Discharge Coordination PROCESS - Special Needs []  - 0 Pediatric / Minor Patient Management []  - 0 Isolation Patient  Management []  - 0 Hearing / Language / Visual special needs []  - 0 Assessment of Community assistance (transportation, D/C planning, etc.) []  - 0 Additional assistance / Altered mentation []  - 0 Support  [name] [name], [name] [name] ([id number]) [id number] Page 1 of 14 Visit Report for 05/28/2023 Arrival Information Details Patient Name: Date of Service: [name] [name]. 05/28/2023 8:00 A M Medical Record Number: [id number] Patient Account Number: [id number] Date of Birth/Sex: Treating RN: 11/13/1955 (67 y.o. [name] [name] Primary Care Tommie Dejoseph: [name] [name] Other Clinician: Referring Keymon Mcelroy: Treating Paw Karstens/Extender: [name] [name]: 22 Visit Information History Since Last Visit Added or deleted any medications: No Patient Arrived: Ambulatory Any new allergies or adverse reactions: No Arrival Time: 08:00 Had a fall or experienced change in No Accompanied By: sister activities of daily living that may affect Transfer Assistance: Manual risk of falls: Patient Identification Verified: Yes Signs or symptoms of abuse/neglect since last visito No Secondary Verification Process Completed: Yes Hospitalized since last visit: No Patient Requires Transmission-Based No Implantable device outside of the clinic excluding No Precautions: cellular tissue based products placed in the center Patient Has Alerts: Yes since last visit: Patient Alerts: 10/23 ABI L0.94 R1 VVS Has Dressing in Place as Prescribed: Yes 10/23 TBI L1.2 R0.88 VVS Pain Present Now: No Electronic Signature(s) Signed: 05/28/2023 9:22:53 AM By: [name] Mu RN Entered By: [name] [name] on 05/28/2023 09:22:52 -------------------------------------------------------------------------------- Clinic Level of Care Assessment Details Patient Name: Date of Service: [name] 37, NO [name] M. 05/28/2023 8:00 A M Medical Record Number: [id number] Patient Account Number: [id number] Date of Birth/Sex: Treating RN: 04-Sep-1955 (67 y.o. [name] [name] Primary Care Lyle Niblett: [name] [name] Other Clinician: Referring Jashaun Penrose: Treating Crystin Lechtenberg/Extender: [name] [name]: 22 Clinic Level of Care Assessment Items TOOL 4 Quantity Score X- 1 0 Use when only an EandM is performed on FOLLOW-UP visit ASSESSMENTS - Nursing Assessment / Reassessment X- 1 10 Reassessment of Co-morbidities (includes updates in patient status) X- 1 5 Reassessment of Adherence to [name] Plan ASSESSMENTS - Wound and Skin A ssessment / Reassessment []  - 0 Simple Wound Assessment / Reassessment - one wound X- 4 5 Complex Wound Assessment / Reassessment - multiple wounds []  - 0 Dermatologic / Skin Assessment (not related to wound area) ASSESSMENTS - Focused Assessment X- 2 5 Circumferential Edema Measurements - multi extremities []  - 0 Nutritional Assessment / Counseling / Intervention [name] [name], [name] [name] ([id number]) [id number] Page 2 of 14 []  - 0 Lower Extremity Assessment (monofilament, tuning fork, pulses) []  - 0 Peripheral Arterial Disease Assessment (using hand held doppler) ASSESSMENTS - Ostomy and/or Continence Assessment and Care []  - 0 Incontinence Assessment and Management []  - 0 Ostomy Care Assessment and Management (repouching, etc.) PROCESS - Coordination of Care []  - 0 Simple Patient / Family Education for ongoing care X- 1 20 Complex (extensive) Patient / Family Education for ongoing care X- 1 10 Staff obtains Chiropractor, Records, T Results / Process Orders est []  - 0 Staff telephones HHA, Nursing Homes / Clarify orders / etc []  - 0 Routine Transfer to another Facility (non-emergent condition) []  - 0 Routine Hospital Admission (non-emergent condition) []  - 0 New Admissions / Manufacturing engineer / Ordering NPWT Apligraf, etc. , []  - 0 Emergency Hospital Admission (emergent condition) []  - 0 Simple Discharge Coordination X- 1 15 Complex (extensive) Discharge Coordination PROCESS - Special Needs []  - 0 Pediatric / Minor Patient Management []  - 0 Isolation Patient  Management []  - 0 Hearing / Language / Visual special needs []  - 0 Assessment of Community assistance (transportation, D/C planning, etc.) []  - 0 Additional assistance / Altered mentation []  - 0 Support

## 2023-05-28 NOTE — Progress Notes (Signed)
and 2 areas on the left 1 of which was the biopsy site Electronic Signature(s) Signed: 05/28/2023 4:45:38 PM By: [name] MD Entered By: [name] on 05/28/2023 08:45:19 -------------------------------------------------------------------------------- Physical Exam Details Patient Name: Date of Service: [name], [name] 05/28/2023 8:00 A M Medical Record Number:  [id number] Patient Account Number: [id number] Date of Birth/Sex: Treating RN: [birth year] (67 y.o. M) Primary Care Provider: Cranford Mon Other Clinician: Referring Provider: Treating Provider/Extender: [name] in Treatment: 22 Constitutional Patient is hypertensive.. Pulse regular and within target range for patient.. Temperature is normal and within the target range for the patient.Marland Kitchen Appears in no distress. Notes Wound exam; the patient definitely has some skin changes of chronic venous insufficiency with hemosiderin deposition. 4 small punched-out areas. All of them except posterior laterally on the right covered in a very adherent fibrinous debris. This is what we have been trying to debride mechanically and also topically with various dressing choices. Nothing really is any better here. The largest areas on the right anterior lower leg Electronic Signature(s) Signed: 05/28/2023 4:45:38 PM By: [name] MD Entered By: [name] on 05/28/2023 08:47:21 -------------------------------------------------------------------------------- Physician Orders Details Patient Name: Date of Service: [name], [name] 05/28/2023 8:00 A M Medical Record Number: [id number] Patient Account Number: [id number] Date of Birth/Sex: Treating RN: 04-Oct-1955 (67 y.o. [name] [name] Primary Care Provider: Cranford Mon Other Clinician: Referring Provider: Treating Provider/Extender: [name] in Treatment: 22 Verbal / Phone Orders: No Diagnosis Coding Follow-up Appointments ppointment in 1 week. - w/ Dr. Leanord Hawking and [facility] Tuesday 06/04/23 Rm # 8 Return A Anesthetic (In clinic) Topical Lidocaine 4% applied to wound bed Bathing/ Shower/ Hygiene May shower and wash wound with soap and water. Edema Control - Lymphedema / SCD / Other Elevate legs to the level of the heart or above for 30 minutes daily and/or when sitting for 3-4 times a day  throughout the day. Avoid standing for long periods of time. [name], [name] ([id number]) [id number] Page 3 of 8 Patient to wear own compression stockings every day. Exercise regularly Compression stocking or Garment 30-40 mm/Hg pressure to: - wear juxatlite HDs both legs. Wound Treatment Wound #3 - Lower Leg Wound Laterality: Right, Anterior, Proximal Cleanser: Soap and Water 1 x Per Day/30 Days Discharge Instructions: May shower and wash wound with dial antibacterial soap and water prior to dressing change. Peri-Wound Care: Triamcinolone 15 (g) 1 x Per Day/30 Days Discharge Instructions: Use triamcinolone apply to entire both legs. around the wounds. Prim Dressing: IODOFLEX 0.9% Cadexomer Iodine Pad 4x6 cm 1 x Per Day/30 Days ary Discharge Instructions: Apply to wound bed as instructed Secondary Dressing: Woven Gauze Sponge, Non-Sterile 4x4 in (Generic) 1 x Per Day/30 Days Discharge Instructions: Apply over primary dressing as directed. Secured With: [employer], 4.5x3.1 (in/yd) (Generic) 1 x Per Day/30 Days Discharge Instructions: Secure with Kerlix as directed. Secured With: 70M Medipore H Soft Cloth Surgical T ape, 4 x 10 (in/yd) (Generic) 1 x Per Day/30 Days Discharge Instructions: Secure with tape as directed. Wound #4 - Lower Leg Wound Laterality: Left, Medial, Distal Cleanser: Soap and Water 1 x Per Day/30 Days Discharge Instructions: May shower and wash wound with dial antibacterial soap and water prior to dressing change. Peri-Wound Care: Triamcinolone 15 (g) 1 x Per Day/30 Days Discharge Instructions: Use triamcinolone apply to entire both legs. around the wounds. Prim Dressing: IODOFLEX 0.9% Cadexomer Iodine Pad 4x6 cm 1  Page 5 of 8 -------------------------------------------------------------------------------- Progress Note Details Patient Name: Date of Service: [name] 05/28/2023 8:00 A M Medical Record Number: [id number] Patient Account Number: [id number] Date of Birth/Sex: Treating RN: 1956/01/17 (67 y.o. M) Primary Care Provider: Cranford Mon Other Clinician: Referring Provider: Treating Provider/Extender: [name] in Treatment: 22 Subjective History of Present Illness (HPI) 12/24/2022 [name] is a 67 year old male with a past medical history of controlled type 2 diabetes on insulin, lymphedema/chronic venous insufficiency and cirrhosis that presents to the clinic for a 1 year history of nonhealing wounds to his lower extremities bilaterally. He reports the starting spontaneously. He states that he has been following with [facility] and they have been using [facility] antibiotic spray to the legs. He is not wearing compression stockings or wraps. He is also tried Medihoney and collagen in the past with little benefit. He currently denies systemic signs of infection. 5/30; patient presents for follow-up. He has been using triamcinolone cream to the periwound and Santyl to the wound beds. There has been improvement in wound healing. He denies signs of infection and has no issues or complaints today. 6/4; both legs look considerably better. He has a wound on the right lateral lower leg and the left medial lower leg. Significant hemosiderin in the right leg less so on the left. We have been using Hydrofera Blue under compression 6/13; patient presents for follow-up. We have been using antibiotic ointment with Hydrofera Blue under compression therapy. The wounds are smaller. He has no issues or complaints today. We discussed ordering juxta lite compression garment wraps T use once his wounds heal as he has hard  time putting on o compression stockings. He was agreeable with this. 6/20; patient presents for follow-up. We have been using antibiotic ointment with Hydrofera Blue under compression therapy to the lower extremities bilaterally. Wounds are smaller. He has been approved for PuraPly and was agreeable with placement today. He denies signs of infection. 6/27; patient presents for follow-up. We have been using antibiotic ointment with Hydrofera Blue under compression therapy to the left lower extremity. This wound is healed. He has his juxta lite compression wraps with him today. T the right lateral leg we have been using PuraPly under compression and that wound o is smaller today. Unfortunately he has developed skin breakdown from the Steri-Strip that was used to hold the PuraPly in place and this has created a new wound on the right leg. 7/8; patient presents for follow-up. We have been using PuraPly to the right lateral leg wound Under compression wrap. Wound is slightly smaller however original wound used for PuraPly is healed. He unfortunately developed a new wound to the left lower extremity but it looks like this was from potential scratching. He has been using his juxta lite compression here. 716; patient presents for follow-up. We have been using antibiotic ointment with Hydrofera Blue under 3 layer compression to lower extremities bilaterally. Unfortunately he has continued to develop wounds to the left lower extremity. He currently denies signs of infection. 7/22; patient presents for follow-up. He has been using Santyl and Hydrofera Blue with triamcinolone cream under her juxta lite compression wraps daily. No new wounds today. Current wounds are stable. Less irritation to the periwound. 7/29; patient presents for follow-up. He has been using Santyl and Hydrofera Blue with triamcinolone cream under the juxta lite compression wraps. He is having a hard time keeping the [facility] in  place  Page 5 of 8 -------------------------------------------------------------------------------- Progress Note Details Patient Name: Date of Service: [name] 05/28/2023 8:00 A M Medical Record Number: [id number] Patient Account Number: [id number] Date of Birth/Sex: Treating RN: 1956/01/17 (67 y.o. M) Primary Care Provider: Cranford Mon Other Clinician: Referring Provider: Treating Provider/Extender: [name] in Treatment: 22 Subjective History of Present Illness (HPI) 12/24/2022 [name] is a 67 year old male with a past medical history of controlled type 2 diabetes on insulin, lymphedema/chronic venous insufficiency and cirrhosis that presents to the clinic for a 1 year history of nonhealing wounds to his lower extremities bilaterally. He reports the starting spontaneously. He states that he has been following with [facility] and they have been using [facility] antibiotic spray to the legs. He is not wearing compression stockings or wraps. He is also tried Medihoney and collagen in the past with little benefit. He currently denies systemic signs of infection. 5/30; patient presents for follow-up. He has been using triamcinolone cream to the periwound and Santyl to the wound beds. There has been improvement in wound healing. He denies signs of infection and has no issues or complaints today. 6/4; both legs look considerably better. He has a wound on the right lateral lower leg and the left medial lower leg. Significant hemosiderin in the right leg less so on the left. We have been using Hydrofera Blue under compression 6/13; patient presents for follow-up. We have been using antibiotic ointment with Hydrofera Blue under compression therapy. The wounds are smaller. He has no issues or complaints today. We discussed ordering juxta lite compression garment wraps T use once his wounds heal as he has hard  time putting on o compression stockings. He was agreeable with this. 6/20; patient presents for follow-up. We have been using antibiotic ointment with Hydrofera Blue under compression therapy to the lower extremities bilaterally. Wounds are smaller. He has been approved for PuraPly and was agreeable with placement today. He denies signs of infection. 6/27; patient presents for follow-up. We have been using antibiotic ointment with Hydrofera Blue under compression therapy to the left lower extremity. This wound is healed. He has his juxta lite compression wraps with him today. T the right lateral leg we have been using PuraPly under compression and that wound o is smaller today. Unfortunately he has developed skin breakdown from the Steri-Strip that was used to hold the PuraPly in place and this has created a new wound on the right leg. 7/8; patient presents for follow-up. We have been using PuraPly to the right lateral leg wound Under compression wrap. Wound is slightly smaller however original wound used for PuraPly is healed. He unfortunately developed a new wound to the left lower extremity but it looks like this was from potential scratching. He has been using his juxta lite compression here. 716; patient presents for follow-up. We have been using antibiotic ointment with Hydrofera Blue under 3 layer compression to lower extremities bilaterally. Unfortunately he has continued to develop wounds to the left lower extremity. He currently denies signs of infection. 7/22; patient presents for follow-up. He has been using Santyl and Hydrofera Blue with triamcinolone cream under her juxta lite compression wraps daily. No new wounds today. Current wounds are stable. Less irritation to the periwound. 7/29; patient presents for follow-up. He has been using Santyl and Hydrofera Blue with triamcinolone cream under the juxta lite compression wraps. He is having a hard time keeping the [facility] in  place  x Per Day/30 Days ary Discharge Instructions: Apply to wound bed as instructed Secondary Dressing: Woven Gauze Sponge, Non-Sterile 4x4 in (Generic) 1 x Per Day/30 Days Discharge Instructions: Apply over primary dressing  as directed. Secured With: [employer], 4.5x3.1 (in/yd) (Generic) 1 x Per Day/30 Days Discharge Instructions: Secure with Kerlix as directed. Secured With: 18M Medipore H Soft Cloth Surgical T ape, 4 x 10 (in/yd) (Generic) 1 x Per Day/30 Days Discharge Instructions: Secure with tape as directed. Wound #5 - Lower Leg Wound Laterality: Right, Lateral, Distal Cleanser: Soap and Water 1 x Per Day/30 Days Discharge Instructions: May shower and wash wound with dial antibacterial soap and water prior to dressing change. Peri-Wound Care: Triamcinolone 15 (g) 1 x Per Day/30 Days Discharge Instructions: Use triamcinolone apply to entire both legs. around the wounds. Prim Dressing: IODOFLEX 0.9% Cadexomer Iodine Pad 4x6 cm 1 x Per Day/30 Days ary Discharge Instructions: Apply to wound bed as instructed Secondary Dressing: Woven Gauze Sponge, Non-Sterile 4x4 in (Generic) 1 x Per Day/30 Days Discharge Instructions: Apply over primary dressing as directed. Secured With: [employer], 4.5x3.1 (in/yd) (Generic) 1 x Per Day/30 Days Discharge Instructions: Secure with Kerlix as directed. Secured With: 18M Medipore H Soft Cloth Surgical T ape, 4 x 10 (in/yd) (Generic) 1 x Per Day/30 Days Discharge Instructions: Secure with tape as directed. Wound #6 - Lower Leg Wound Laterality: Left, Posterior, Proximal Cleanser: Soap and Water 1 x Per Day/30 Days Discharge Instructions: May shower and wash wound with dial antibacterial soap and water prior to dressing change. Peri-Wound Care: Triamcinolone 15 (g) 1 x Per Day/30 Days Discharge Instructions: Use triamcinolone apply to entire both legs. around the wounds. Prim Dressing: IODOFLEX 0.9% Cadexomer Iodine Pad 4x6 cm 1 x Per Day/30 Days ary Discharge Instructions: Apply to wound bed as instructed Secondary Dressing: Woven Gauze Sponge, Non-Sterile 4x4 in (Generic) 1 x Per Day/30 Days Discharge Instructions: Apply over primary dressing as  directed. Secured With: [employer], 4.5x3.1 (in/yd) (Generic) 1 x Per Day/30 Days [name] [name], [name] [name] ([id number]) [id number] Page 4 of 8 Discharge Instructions: Secure with Kerlix as directed. Secured With: 18M Medipore H Soft Cloth Surgical T ape, 4 x 10 (in/yd) (Generic) 1 x Per Day/30 Days Discharge Instructions: Secure with tape as directed. Electronic Signature(s) Signed: 05/28/2023 4:45:15 PM By: Fonnie Mu RN Signed: 05/28/2023 4:45:38 PM By: [name] MD Entered By: Fonnie Mu on 05/28/2023 08:39:10 -------------------------------------------------------------------------------- Problem List Details Patient Name: Date of Service: [name], [name] 05/28/2023 8:00 A M Medical Record Number: [id number] Patient Account Number: [id number] Date of Birth/Sex: Treating RN: 1956/04/17 (67 y.o. 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 12/24/2022 No Yes L97.818 Non-pressure chronic ulcer of other part of right lower leg with other specified 12/24/2022 No Yes severity L97.828 Non-pressure chronic ulcer of other part of left lower leg with other specified 12/24/2022 No Yes severity I89.0 Lymphedema, not elsewhere classified 12/24/2022 No Yes E11.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 other skin ulcer 12/24/2022 No Yes Inactive Problems ICD-10 Code Description Active Date Inactive Date 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 Peri-Wound Care: Triamcinolone 15 (g) 1 x Per Day/30 Days Discharge Instructions: Use triamcinolone apply to entire both legs. around the  wounds. Prim Dressing: IODOFLEX 0.9% Cadexomer Iodine Pad 4x6 cm 1 x Per Day/30 Days ary Discharge Instructions: Apply to wound bed as instructed Secondary Dressing: Woven Gauze Sponge, Non-Sterile 4x4 in (Generic) 1 x Per Day/30 Days Discharge Instructions: Apply over primary dressing as directed. Secured With: [employer], 4.5x3.1 (in/yd) (Generic) 1 x Per Day/30 Days Discharge Instructions: Secure with Kerlix as directed. Secured With: 46M Medipore H Soft Cloth Surgical T ape, 4 x 10 (in/yd) (Generic) 1 x Per Day/30 Days Discharge Instructions: Secure with tape as directed. WOUND #6: - Lower Leg Wound Laterality: Left, Posterior, Proximal Cleanser: Soap and Water 1 x Per Day/30 Days Discharge Instructions: May shower and wash wound with dial antibacterial soap and water prior to dressing change. Peri-Wound Care: Triamcinolone 15 (g) 1 x Per Day/30 Days Discharge Instructions: Use triamcinolone apply to entire both legs. around the wounds. Prim Dressing: IODOFLEX 0.9% Cadexomer Iodine Pad 4x6 cm 1 x Per Day/30 Days ary Discharge Instructions: Apply to wound bed as instructed Secondary Dressing: Woven Gauze Sponge, Non-Sterile 4x4 in (Generic) 1 x Per Day/30 Days Discharge Instructions: Apply over primary dressing as directed. Secured With: [employer], 4.5x3.1 (in/yd) (Generic) 1 x Per Day/30 Days Discharge Instructions: Secure with Kerlix as directed. Secured With: 46M Medipore H Soft Cloth Surgical T ape, 4 x 10 (in/yd) (Generic) 1 x Per Day/30 Days Discharge Instructions: Secure with tape as directed. 1. I am going to change the primary dressing to Iodoflex for an additional attempt to debride the surfaces of these wounds 2. We did not make any progress with Santyl. 3. Does not want mechanical debridements 4. 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 We have good edema control with his own juxta lite  stockings Electronic Signature(s) Signed: 05/28/2023 4:45:38 PM By: [name] MD Entered By: [name] on 05/28/2023 08:48:24 -------------------------------------------------------------------------------- SuperBill Details Patient Name: Date of Service: [name] RRIS M. 05/28/2023 Medical Record Number: [id number] Patient Account Number: [id number] Date of Birth/Sex: Treating RN: Sep 16, 1955 (67 y.o. 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 severity L97.828 Non-pressure chronic ulcer of other part of left lower leg with other specified severity I89.0 Lymphedema, not elsewhere classified E11.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 other skin ulcer Facility Procedures : CPT4 Code: [id number] Description: 38756 - WOUND CARE VISIT-LEV 5 EST PT Modifier: Quantity: 1 Physician Procedures : CPT4 Code Description Modifier [id number] - WC PHYS LEVEL 3 - EST PT ICD-10 Diagnosis Description I87.313 Chronic venous hypertension (idiopathic) with ulcer of bilateral lower extremity L97.818 Non-pressure chronic ulcer of other part of right  lower leg with other specified severity L97.828 Non-pressure chronic ulcer of other part of left lower leg with other specified severity E11.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 other skin ulcer Quantity: 1 Electronic Signature(s) Signed: 05/28/2023 9:23:44 AM By: Fonnie Mu RN Signed: 05/28/2023 4:45:38 PM By: [name] MD Entered By: Fonnie Mu on 05/28/2023 09:23:43  [name] [name], [name] [name] ([id number]) [id number] Page 1 of 8 Visit Report for 05/28/2023 HPI Details Patient Name: Date of Service: [name] 05/28/2023 8:00 A M Medical Record Number: [id number] Patient Account Number: [id number] Date of Birth/Sex: Treating RN: May 12, 1956 (67 y.o. M) Primary Care Provider: Cranford Mon Other Clinician: Referring Provider: Treating Provider/Extender: [name] in Treatment: 22 History of Present Illness HPI Description: 12/24/2022 Mr. Ras Swindell is a 67 year old male with a past medical history of controlled type 2 diabetes on insulin, lymphedema/chronic venous insufficiency and cirrhosis that presents to the clinic for a 1 year history of nonhealing wounds to his lower extremities bilaterally. He reports the starting spontaneously. He states that he has been following with [facility] and they have been using [facility] antibiotic spray to the legs. He is not wearing compression stockings or wraps. He is also tried Medihoney and collagen in the past with little benefit. He currently denies systemic signs of infection. 5/30; patient presents for follow-up. He has been using triamcinolone cream to the periwound and Santyl to the wound beds. There has been improvement in wound healing. He denies signs of infection and has no issues or complaints today. 6/4; both legs look considerably better. He has a wound on the right lateral lower leg and the left medial lower leg. Significant hemosiderin in the right leg less so on the left. We have been using Hydrofera Blue under compression 6/13; patient presents for follow-up. We have been using antibiotic ointment with Hydrofera Blue under compression therapy. The wounds are smaller. He has no issues or complaints today. We discussed ordering juxta lite compression garment wraps T use once his wounds heal as he has hard time putting on o compression  stockings. He was agreeable with this. 6/20; patient presents for follow-up. We have been using antibiotic ointment with Hydrofera Blue under compression therapy to the lower extremities bilaterally. Wounds are smaller. He has been approved for PuraPly and was agreeable with placement today. He denies signs of infection. 6/27; patient presents for follow-up. We have been using antibiotic ointment with Hydrofera Blue under compression therapy to the left lower extremity. This wound is healed. He has his juxta lite compression wraps with him today. T the right lateral leg we have been using PuraPly under compression and that wound o is smaller today. Unfortunately he has developed skin breakdown from the Steri-Strip that was used to hold the PuraPly in place and this has created a new wound on the right leg. 7/8; patient presents for follow-up. We have been using PuraPly to the right lateral leg wound Under compression wrap. Wound is slightly smaller however original wound used for PuraPly is healed. He unfortunately developed a new wound to the left lower extremity but it looks like this was from potential scratching. He has been using his juxta lite compression here. 716; patient presents for follow-up. We have been using antibiotic ointment with Hydrofera Blue under 3 layer compression to lower extremities bilaterally. Unfortunately he has continued to develop wounds to the left lower extremity. He currently denies signs of infection. 7/22; patient presents for follow-up. He has been using Santyl and Hydrofera Blue with triamcinolone cream under her juxta lite compression wraps daily. No new wounds today. Current wounds are stable. Less irritation to the periwound. 7/29; patient presents for follow-up. He has been using Santyl and Hydrofera Blue with triamcinolone cream under the juxta lite compression wraps. He is having a hard time  x Per Day/30 Days ary Discharge Instructions: Apply to wound bed as instructed Secondary Dressing: Woven Gauze Sponge, Non-Sterile 4x4 in (Generic) 1 x Per Day/30 Days Discharge Instructions: Apply over primary dressing  as directed. Secured With: [employer], 4.5x3.1 (in/yd) (Generic) 1 x Per Day/30 Days Discharge Instructions: Secure with Kerlix as directed. Secured With: 18M Medipore H Soft Cloth Surgical T ape, 4 x 10 (in/yd) (Generic) 1 x Per Day/30 Days Discharge Instructions: Secure with tape as directed. Wound #5 - Lower Leg Wound Laterality: Right, Lateral, Distal Cleanser: Soap and Water 1 x Per Day/30 Days Discharge Instructions: May shower and wash wound with dial antibacterial soap and water prior to dressing change. Peri-Wound Care: Triamcinolone 15 (g) 1 x Per Day/30 Days Discharge Instructions: Use triamcinolone apply to entire both legs. around the wounds. Prim Dressing: IODOFLEX 0.9% Cadexomer Iodine Pad 4x6 cm 1 x Per Day/30 Days ary Discharge Instructions: Apply to wound bed as instructed Secondary Dressing: Woven Gauze Sponge, Non-Sterile 4x4 in (Generic) 1 x Per Day/30 Days Discharge Instructions: Apply over primary dressing as directed. Secured With: [employer], 4.5x3.1 (in/yd) (Generic) 1 x Per Day/30 Days Discharge Instructions: Secure with Kerlix as directed. Secured With: 18M Medipore H Soft Cloth Surgical T ape, 4 x 10 (in/yd) (Generic) 1 x Per Day/30 Days Discharge Instructions: Secure with tape as directed. Wound #6 - Lower Leg Wound Laterality: Left, Posterior, Proximal Cleanser: Soap and Water 1 x Per Day/30 Days Discharge Instructions: May shower and wash wound with dial antibacterial soap and water prior to dressing change. Peri-Wound Care: Triamcinolone 15 (g) 1 x Per Day/30 Days Discharge Instructions: Use triamcinolone apply to entire both legs. around the wounds. Prim Dressing: IODOFLEX 0.9% Cadexomer Iodine Pad 4x6 cm 1 x Per Day/30 Days ary Discharge Instructions: Apply to wound bed as instructed Secondary Dressing: Woven Gauze Sponge, Non-Sterile 4x4 in (Generic) 1 x Per Day/30 Days Discharge Instructions: Apply over primary dressing as  directed. Secured With: [employer], 4.5x3.1 (in/yd) (Generic) 1 x Per Day/30 Days [name] [name], [name] [name] ([id number]) [id number] Page 4 of 8 Discharge Instructions: Secure with Kerlix as directed. Secured With: 18M Medipore H Soft Cloth Surgical T ape, 4 x 10 (in/yd) (Generic) 1 x Per Day/30 Days Discharge Instructions: Secure with tape as directed. Electronic Signature(s) Signed: 05/28/2023 4:45:15 PM By: Fonnie Mu RN Signed: 05/28/2023 4:45:38 PM By: [name] MD Entered By: Fonnie Mu on 05/28/2023 08:39:10 -------------------------------------------------------------------------------- Problem List Details Patient Name: Date of Service: [name], [name] 05/28/2023 8:00 A M Medical Record Number: [id number] Patient Account Number: [id number] Date of Birth/Sex: Treating RN: 1956/04/17 (67 y.o. 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 12/24/2022 No Yes L97.818 Non-pressure chronic ulcer of other part of right lower leg with other specified 12/24/2022 No Yes severity L97.828 Non-pressure chronic ulcer of other part of left lower leg with other specified 12/24/2022 No Yes severity I89.0 Lymphedema, not elsewhere classified 12/24/2022 No Yes E11.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 other skin ulcer 12/24/2022 No Yes Inactive Problems ICD-10 Code Description Active Date Inactive Date 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 04/09/2023 K74.69 Other cirrhosis of liver 12/24/2022 12/24/2022 Resolved Problems Electronic Signature(s) Signed: 05/28/2023 4:45:38 PM By: [name] MD Entered By: [name] on 05/28/2023 08:43:42 [name] [name] ([id number])  [id number]  and 2 areas on the left 1 of which was the biopsy site Electronic Signature(s) Signed: 05/28/2023 4:45:38 PM By: [name] MD Entered By: [name] on 05/28/2023 08:45:19 -------------------------------------------------------------------------------- Physical Exam Details Patient Name: Date of Service: [name], [name] 05/28/2023 8:00 A M Medical Record Number:  [id number] Patient Account Number: [id number] Date of Birth/Sex: Treating RN: [birth year] (67 y.o. M) Primary Care Provider: Cranford Mon Other Clinician: Referring Provider: Treating Provider/Extender: [name] in Treatment: 22 Constitutional Patient is hypertensive.. Pulse regular and within target range for patient.. Temperature is normal and within the target range for the patient.Marland Kitchen Appears in no distress. Notes Wound exam; the patient definitely has some skin changes of chronic venous insufficiency with hemosiderin deposition. 4 small punched-out areas. All of them except posterior laterally on the right covered in a very adherent fibrinous debris. This is what we have been trying to debride mechanically and also topically with various dressing choices. Nothing really is any better here. The largest areas on the right anterior lower leg Electronic Signature(s) Signed: 05/28/2023 4:45:38 PM By: [name] MD Entered By: [name] on 05/28/2023 08:47:21 -------------------------------------------------------------------------------- Physician Orders Details Patient Name: Date of Service: [name], [name] 05/28/2023 8:00 A M Medical Record Number: [id number] Patient Account Number: [id number] Date of Birth/Sex: Treating RN: 04-Oct-1955 (67 y.o. [name] [name] Primary Care Provider: Cranford Mon Other Clinician: Referring Provider: Treating Provider/Extender: [name] in Treatment: 22 Verbal / Phone Orders: No Diagnosis Coding Follow-up Appointments ppointment in 1 week. - w/ Dr. Leanord Hawking and [facility] Tuesday 06/04/23 Rm # 8 Return A Anesthetic (In clinic) Topical Lidocaine 4% applied to wound bed Bathing/ Shower/ Hygiene May shower and wash wound with soap and water. Edema Control - Lymphedema / SCD / Other Elevate legs to the level of the heart or above for 30 minutes daily and/or when sitting for 3-4 times a day  throughout the day. Avoid standing for long periods of time. [name], [name] ([id number]) [id number] Page 3 of 8 Patient to wear own compression stockings every day. Exercise regularly Compression stocking or Garment 30-40 mm/Hg pressure to: - wear juxatlite HDs both legs. Wound Treatment Wound #3 - Lower Leg Wound Laterality: Right, Anterior, Proximal Cleanser: Soap and Water 1 x Per Day/30 Days Discharge Instructions: May shower and wash wound with dial antibacterial soap and water prior to dressing change. Peri-Wound Care: Triamcinolone 15 (g) 1 x Per Day/30 Days Discharge Instructions: Use triamcinolone apply to entire both legs. around the wounds. Prim Dressing: IODOFLEX 0.9% Cadexomer Iodine Pad 4x6 cm 1 x Per Day/30 Days ary Discharge Instructions: Apply to wound bed as instructed Secondary Dressing: Woven Gauze Sponge, Non-Sterile 4x4 in (Generic) 1 x Per Day/30 Days Discharge Instructions: Apply over primary dressing as directed. Secured With: [employer], 4.5x3.1 (in/yd) (Generic) 1 x Per Day/30 Days Discharge Instructions: Secure with Kerlix as directed. Secured With: 70M Medipore H Soft Cloth Surgical T ape, 4 x 10 (in/yd) (Generic) 1 x Per Day/30 Days Discharge Instructions: Secure with tape as directed. Wound #4 - Lower Leg Wound Laterality: Left, Medial, Distal Cleanser: Soap and Water 1 x Per Day/30 Days Discharge Instructions: May shower and wash wound with dial antibacterial soap and water prior to dressing change. Peri-Wound Care: Triamcinolone 15 (g) 1 x Per Day/30 Days Discharge Instructions: Use triamcinolone apply to entire both legs. around the wounds. Prim Dressing: IODOFLEX 0.9% Cadexomer Iodine Pad 4x6 cm 1  with dial antibacterial soap and water prior to dressing change. Peri-Wound Care: Triamcinolone 15 (g) 1 x Per Day/30 Days Discharge Instructions: Use triamcinolone apply to entire both legs. around the  wounds. Prim Dressing: IODOFLEX 0.9% Cadexomer Iodine Pad 4x6 cm 1 x Per Day/30 Days ary Discharge Instructions: Apply to wound bed as instructed Secondary Dressing: Woven Gauze Sponge, Non-Sterile 4x4 in (Generic) 1 x Per Day/30 Days Discharge Instructions: Apply over primary dressing as directed. Secured With: [employer], 4.5x3.1 (in/yd) (Generic) 1 x Per Day/30 Days Discharge Instructions: Secure with Kerlix as directed. Secured With: 46M Medipore H Soft Cloth Surgical T ape, 4 x 10 (in/yd) (Generic) 1 x Per Day/30 Days Discharge Instructions: Secure with tape as directed. WOUND #6: - Lower Leg Wound Laterality: Left, Posterior, Proximal Cleanser: Soap and Water 1 x Per Day/30 Days Discharge Instructions: May shower and wash wound with dial antibacterial soap and water prior to dressing change. Peri-Wound Care: Triamcinolone 15 (g) 1 x Per Day/30 Days Discharge Instructions: Use triamcinolone apply to entire both legs. around the wounds. Prim Dressing: IODOFLEX 0.9% Cadexomer Iodine Pad 4x6 cm 1 x Per Day/30 Days ary Discharge Instructions: Apply to wound bed as instructed Secondary Dressing: Woven Gauze Sponge, Non-Sterile 4x4 in (Generic) 1 x Per Day/30 Days Discharge Instructions: Apply over primary dressing as directed. Secured With: [employer], 4.5x3.1 (in/yd) (Generic) 1 x Per Day/30 Days Discharge Instructions: Secure with Kerlix as directed. Secured With: 46M Medipore H Soft Cloth Surgical T ape, 4 x 10 (in/yd) (Generic) 1 x Per Day/30 Days Discharge Instructions: Secure with tape as directed. 1. I am going to change the primary dressing to Iodoflex for an additional attempt to debride the surfaces of these wounds 2. We did not make any progress with Santyl. 3. Does not want mechanical debridements 4. 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 We have good edema control with his own juxta lite  stockings Electronic Signature(s) Signed: 05/28/2023 4:45:38 PM By: [name] MD Entered By: [name] on 05/28/2023 08:48:24 -------------------------------------------------------------------------------- SuperBill Details Patient Name: Date of Service: [name] RRIS M. 05/28/2023 Medical Record Number: [id number] Patient Account Number: [id number] Date of Birth/Sex: Treating RN: Sep 16, 1955 (67 y.o. 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 severity L97.828 Non-pressure chronic ulcer of other part of left lower leg with other specified severity I89.0 Lymphedema, not elsewhere classified E11.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 other skin ulcer Facility Procedures : CPT4 Code: [id number] Description: 38756 - WOUND CARE VISIT-LEV 5 EST PT Modifier: Quantity: 1 Physician Procedures : CPT4 Code Description Modifier [id number] - WC PHYS LEVEL 3 - EST PT ICD-10 Diagnosis Description I87.313 Chronic venous hypertension (idiopathic) with ulcer of bilateral lower extremity L97.818 Non-pressure chronic ulcer of other part of right  lower leg with other specified severity L97.828 Non-pressure chronic ulcer of other part of left lower leg with other specified severity E11.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 other skin ulcer Quantity: 1 Electronic Signature(s) Signed: 05/28/2023 9:23:44 AM By: Fonnie Mu RN Signed: 05/28/2023 4:45:38 PM By: [name] MD Entered By: Fonnie Mu on 05/28/2023 09:23:43

## 2023-06-04 ENCOUNTER — Encounter (HOSPITAL_BASED_OUTPATIENT_CLINIC_OR_DEPARTMENT_OTHER): Payer: Medicare Other | Admitting: Internal Medicine

## 2023-06-04 DIAGNOSIS — I87313 Chronic venous hypertension (idiopathic) with ulcer of bilateral lower extremity: Secondary | ICD-10-CM | POA: Diagnosis not present

## 2023-06-05 NOTE — Progress Notes (Signed)
Lower Leg Wound Laterality: Right, Lateral, Distal Cleanser: Soap and Water 1 x Per Day/30 Days Discharge Instructions: May shower and wash wound with dial antibacterial soap and water prior to dressing  change. Peri-Wound Care: Triamcinolone 15 (g) 1 x Per Day/30 Days Discharge Instructions: Use triamcinolone apply to entire both legs. around the wounds. Prim Dressing: IODOFLEX 0.9% Cadexomer Iodine Pad 4x6 cm 1 x Per Day/30 Days ary Discharge Instructions: Apply to wound bed as instructed Secondary Dressing: Woven Gauze Sponge, Non-Sterile 4x4 in (Generic) 1 x Per Day/30 Days Discharge Instructions: Apply over primary dressing as directed. Secured With: [employer], 4.5x3.1 (in/yd) (Generic) 1 x Per Day/30 Days Discharge Instructions: Secure with Kerlix as directed. Secured With: 87M Medipore H Soft Cloth Surgical T ape, 4 x 10 (in/yd) (Generic) 1 x Per Day/30 Days Discharge Instructions: Secure with tape as directed. WOUND #6: - Lower Leg Wound Laterality: Left, Posterior, Proximal Cleanser: Soap and Water 1 x Per Day/30 Days Discharge Instructions: May shower and wash wound with dial antibacterial soap and water prior to dressing change. Peri-Wound Care: Triamcinolone 15 (g) 1 x Per Day/30 Days Discharge Instructions: Use triamcinolone apply to entire both legs. around the wounds. Prim Dressing: IODOFLEX 0.9% Cadexomer Iodine Pad 4x6 cm 1 x Per Day/30 Days ary Discharge Instructions: Apply to wound bed as instructed Secondary Dressing: Woven Gauze Sponge, Non-Sterile 4x4 in (Generic) 1 x Per Day/30 Days Discharge Instructions: Apply over primary dressing as directed. Secured With: [employer], 4.5x3.1 (in/yd) (Generic) 1 x Per Day/30 Days Discharge Instructions: Secure with Kerlix as directed. Secured With: 87M Medipore H Soft Cloth Surgical T ape, 4 x 10 (in/yd) (Generic) 1 x Per Day/30 Days Discharge Instructions: Secure with tape as directed. 1. Continue with TCA and Iodoflex gauze changing every second day 2. He wears his own juxta lite stockings 3. Follow-up in 2 weeks Electronic Signature(s) Signed: 06/04/2023 5:13:18 PM By: [name] MD Entered By:  [name] on 06/04/2023 05:30:44 -------------------------------------------------------------------------------- SuperBill Details Patient Name: Date of Service: [name] [name], [name] RRIS M. 06/04/2023 Medical Record Number: [id number] Patient Account Number: [id number] Date of Birth/Sex: Treating RN: [birth year] (67 y.o. M) Primary Care Provider: Cranford Mon Other Clinician: Referring Provider: Treating Provider/Extender: [name] in Treatment: [address], [place] M ([id number]) [id number] Page 8 of 8 Diagnosis Coding ICD-10 Codes Code Description I87.313 Chronic venous hypertension (idiopathic) with ulcer of bilateral lower extremity L97.818 Non-pressure chronic ulcer of other part of right lower leg with other specified severity L97.828 Non-pressure chronic ulcer of other part of left lower leg with other specified severity I89.0 Lymphedema, not elsewhere classified E11.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 other skin ulcer Facility Procedures : CPT4 Code: 01027253 Description: 66440 - WOUND CARE VISIT-LEV 5 EST PT Modifier: Quantity: 1 Physician Procedures : CPT4 Code Description Modifier [id number] - WC PHYS LEVEL 3 - EST PT ICD-10 Diagnosis Description I87.313 Chronic venous hypertension (idiopathic) with ulcer of bilateral lower extremity L97.818 Non-pressure chronic ulcer of other part of right  lower leg with other specified severity L97.828 Non-pressure chronic ulcer of other part of left lower leg with other specified severity E11.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 other skin ulcer Quantity: 1 Electronic Signature(s) Signed: 06/04/2023 4:23:12 PM By: Fonnie Mu RN Signed: 06/04/2023 5:13:18 PM By: [name] MD Entered By: Fonnie Mu on 06/04/2023 05:59:57  and 2 areas on the left 1 of which was the biopsy site 10/29; some improvement. The superior wounds on both sides have dried up eschar on the surface he has not been allowing debridement but these look better. The deeper punched-out area on the right also appears to have a better surface. The biopsy site again eschared over but looks as though it is contracting. I changed to Iodosorb last week with  TCA. 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 06/04/2023 8:00 A M Medical Record Number: [id number] Patient Account Number: [id number] Date of Birth/Sex: Treating RN: September 05, 1955 (67 y.o. M) Primary Care Provider: Cranford Mon Other Clinician: Referring Provider: Treating Provider/Extender: [name] in Treatment: 23 Constitutional Patient is hypertensive.. Pulse regular and within target range for patient.Marland Kitchen Respirations regular, non-labored and within target range.. Temperature is normal and within the target range for the patient.Marland Kitchen Appears in no distress. Notes Wound exam; there is hemosiderin deposition. 4 areas 3 of which are eschared over the only large areas the lower 1 on the right anterior tibia. Ideally I would probably debride the eschar off the surface of 3 of these perhaps for her although he is not allowing this. No evidence of surrounding infection. Venous insufficiency changes but edema control is good Electronic Signature(s) Signed: 06/04/2023 5:13:18 PM By: [name] MD Entered By: [name] on 06/04/2023 05:29:58 -------------------------------------------------------------------------------- Physician Orders Details Patient Name: Date of Service: [name] [name], [name] 06/04/2023 8:00 A M Medical Record Number: [id number] Patient Account Number: [id number] Date of Birth/Sex: Treating RN: [birth year] (67 y.o. [name] Primary Care Provider: Cranford Mon Other Clinician: Referring Provider: Treating Provider/Extender: [name] in Treatment: 56 Verbal / Phone Orders:  No Diagnosis Coding Follow-up Appointments ppointment in 2 weeks. - w/ DR. Irena Gaydos-schedule at front desk Return A Anesthetic (In clinic) Topical Lidocaine 4% applied to wound bed Bathing/ Shower/ Hygiene May shower and wash wound with soap and water. RUFINO, TACK ([id number]) [id number] Page 3 of 8 Wound Treatment Wound #3 - Lower Leg Wound Laterality: Right, Anterior, Proximal Cleanser: Soap and Water 1 x Per Day/30 Days Discharge Instructions: May shower and wash wound with dial antibacterial soap and water prior to dressing change. Peri-Wound Care: Triamcinolone 15 (g) 1 x Per Day/30 Days Discharge Instructions: Use triamcinolone apply to entire both legs. around the wounds. Prim Dressing: IODOFLEX 0.9% Cadexomer Iodine Pad 4x6 cm 1 x Per Day/30 Days ary Discharge Instructions: Apply to wound bed as instructed Secondary Dressing: Woven Gauze Sponge, Non-Sterile 4x4 in (Generic) 1 x Per Day/30 Days Discharge Instructions: Apply over primary dressing as directed. Secured With: [employer], 4.5x3.1 (in/yd) (Generic) 1 x Per Day/30 Days Discharge Instructions: Secure with Kerlix as directed. Secured With: 44M Medipore H Soft Cloth Surgical T ape, 4 x 10 (in/yd) (Generic) 1 x Per Day/30 Days Discharge Instructions: Secure with tape as directed. Wound #4 - Lower Leg Wound Laterality: Left, Medial, Distal Cleanser: Soap and Water 1 x Per Day/30 Days Discharge Instructions: May shower and wash wound with dial antibacterial soap and water prior to dressing change. Peri-Wound Care: Triamcinolone 15 (g) 1 x Per Day/30 Days Discharge Instructions: Use triamcinolone apply to entire both legs. around the wounds. Prim Dressing: IODOFLEX 0.9% Cadexomer  Iodine Pad 4x6 cm 1 x Per Day/30 Days ary Discharge Instructions: Apply to wound bed as instructed Secondary Dressing: Woven Gauze Sponge, Non-Sterile 4x4 in (Generic) 1 x Per Day/30 Days Discharge  Instructions: Apply over primary dressing as directed. Secured With: [employer], 4.5x3.1 (in/yd) (Generic) 1 x Per Day/30 Days Discharge Instructions: Secure with Kerlix as directed. Secured With: 21M Medipore H Soft Cloth Surgical T ape, 4 x 10 (in/yd) (Generic) 1 x Per Day/30 Days Discharge Instructions: Secure with tape as directed. Wound #5 - Lower Leg Wound Laterality: Right, Lateral, Distal Cleanser: Soap and Water 1 x Per Day/30 Days Discharge Instructions: May shower and wash wound with dial antibacterial soap and water prior to dressing change. Peri-Wound Care: Triamcinolone 15 (g) 1 x Per Day/30 Days Discharge Instructions: Use triamcinolone apply to entire both legs. around the wounds. Prim Dressing: IODOFLEX 0.9% Cadexomer Iodine Pad 4x6 cm 1 x Per Day/30 Days ary Discharge Instructions: Apply to wound bed as instructed Secondary Dressing: Woven Gauze Sponge, Non-Sterile 4x4 in (Generic) 1 x Per Day/30 Days Discharge Instructions: Apply over primary dressing as directed. Secured With: [employer], 4.5x3.1 (in/yd) (Generic) 1 x Per Day/30 Days Discharge Instructions: Secure with Kerlix as directed. Secured With: 21M Medipore H Soft Cloth Surgical T ape, 4 x 10 (in/yd) (Generic) 1 x Per Day/30 Days Discharge Instructions: Secure with tape as directed. Wound #6 - Lower Leg Wound Laterality: Left, Posterior, Proximal Cleanser: Soap and Water 1 x Per Day/30 Days Discharge Instructions: May shower and wash wound with dial antibacterial soap and water prior to dressing change. Peri-Wound Care: Triamcinolone 15 (g) 1 x Per Day/30 Days Discharge Instructions: Use triamcinolone apply to entire both legs. around the wounds. Prim Dressing: IODOFLEX 0.9% Cadexomer Iodine Pad 4x6 cm 1 x Per Day/30 Days ary Discharge Instructions: Apply to wound bed as instructed Secondary Dressing: Woven Gauze Sponge, Non-Sterile 4x4 in (Generic) 1 x Per Day/30 Days Discharge Instructions:  Apply over primary dressing as directed. Secured With: [employer], 4.5x3.1 (in/yd) (Generic) 1 x Per Day/30 Days Discharge Instructions: Secure with Kerlix as directed. Secured With: 21M Medipore H Soft Cloth Surgical T ape, 4 x 10 (in/yd) (Generic) 1 x Per Day/30 Days Discharge Instructions: Secure with tape as directed. BARI, DOORLEY ([id number]) [id number] Page 4 of 8 Electronic Signature(s) Signed: 06/04/2023 4:23:12 PM By: Fonnie Mu RN Signed: 06/04/2023 5:13:18 PM By: [name] MD Entered By: Fonnie Mu on 06/04/2023 05:26:29 -------------------------------------------------------------------------------- Problem List Details Patient Name: Date of Service: [name] [name], [name] 06/04/2023 8:00 A M Medical Record Number: [id number] Patient Account Number: [id number] Date of Birth/Sex: Treating RN: 1955/08/21 (67 y.o. M) Primary Care Provider: Cranford Mon Other Clinician: Referring Provider: Treating Provider/Extender: [name] in Treatment: 23 Active Problems ICD-10 Encounter Code Description Active Date MDM Diagnosis I87.313 Chronic venous hypertension (idiopathic) with ulcer of bilateral lower extremity 12/24/2022 No Yes L97.818 Non-pressure chronic ulcer of other part of right lower leg with other specified 12/24/2022 No Yes severity L97.828 Non-pressure chronic ulcer of other part of left lower leg with other specified 12/24/2022 No Yes severity I89.0 Lymphedema, not elsewhere classified 12/24/2022 No Yes E11.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 other skin ulcer 12/24/2022 No Yes Inactive Problems ICD-10 Code Description Active Date Inactive Date 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 06/04/2023 8:00 A M Medical Record Number: [id number] Patient Account Number: [id number] Date of Birth/Sex: Treating RN: 10/15/1955 (67 y.o. M) Primary Care Provider: Cranford Mon Other Clinician: Referring Provider: Treating Provider/Extender: [name] in Treatment: 23 History of Present Illness HPI Description: 12/24/2022 Mr. Baine Brester is a 67 year old male with a past medical history of controlled type 2 diabetes on insulin, lymphedema/chronic venous insufficiency and cirrhosis that presents to the clinic for a 1 year history of nonhealing wounds to his lower extremities bilaterally. He reports the starting spontaneously. He states that he has been following with [facility] and they have been using [facility] antibiotic spray to the legs. He is not wearing compression stockings or wraps. He is also tried Medihoney and collagen in the past with little benefit. He currently denies systemic signs of infection. 5/30; patient presents for follow-up. He has been using triamcinolone cream to the periwound and Santyl to the wound beds. There has been improvement in wound healing. He denies signs of infection and has no issues or complaints today. 6/4; both legs look considerably better. He has a wound on the right lateral lower leg and the left medial lower leg. Significant hemosiderin in the right leg less so on the left. We have been using Hydrofera Blue under compression 6/13; patient presents for follow-up. We have been using antibiotic ointment with Hydrofera Blue under compression therapy. The wounds are smaller. He has no issues or complaints today. We discussed ordering juxta lite compression garment wraps T use once his wounds heal as he has hard time putting on o compression  stockings. He was agreeable with this. 6/20; patient presents for follow-up. We have been using antibiotic ointment with Hydrofera Blue under compression therapy to the lower extremities bilaterally. Wounds are smaller. He has been approved for PuraPly and was agreeable with placement today. He denies signs of infection. 6/27; patient presents for follow-up. We have been using antibiotic ointment with Hydrofera Blue under compression therapy to the left lower extremity. This wound is healed. He has his juxta lite compression wraps with him today. T the right lateral leg we have been using PuraPly under compression and that wound o is smaller today. Unfortunately he has developed skin breakdown from the Steri-Strip that was used to hold the PuraPly in place and this has created a new wound on the right leg. 7/8; patient presents for follow-up. We have been using PuraPly to the right lateral leg wound Under compression wrap. Wound is slightly smaller however original wound used for PuraPly is healed. He unfortunately developed a new wound to the left lower extremity but it looks like this was from potential scratching. He has been using his juxta lite compression here. 716; patient presents for follow-up. We have been using antibiotic ointment with Hydrofera Blue under 3 layer compression to lower extremities bilaterally. Unfortunately he has continued to develop wounds to the left lower extremity. He currently denies signs of infection. 7/22; patient presents for follow-up. He has been using Santyl and Hydrofera Blue with triamcinolone cream under her juxta lite compression wraps daily. No new wounds today. Current wounds are stable. Less irritation to the periwound. 7/29; patient presents for follow-up. He has been using Santyl and Hydrofera Blue with triamcinolone cream under the juxta lite compression wraps. He is having a hard time  and 2 areas on the left 1 of which was the biopsy site 10/29; some improvement. The superior wounds on both sides have dried up eschar on the surface he has not been allowing debridement but these look better. The deeper punched-out area on the right also appears to have a better surface. The biopsy site again eschared over but looks as though it is contracting. I changed to Iodosorb last week with  TCA. 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 06/04/2023 8:00 A M Medical Record Number: [id number] Patient Account Number: [id number] Date of Birth/Sex: Treating RN: September 05, 1955 (67 y.o. M) Primary Care Provider: Cranford Mon Other Clinician: Referring Provider: Treating Provider/Extender: [name] in Treatment: 23 Constitutional Patient is hypertensive.. Pulse regular and within target range for patient.Marland Kitchen Respirations regular, non-labored and within target range.. Temperature is normal and within the target range for the patient.Marland Kitchen Appears in no distress. Notes Wound exam; there is hemosiderin deposition. 4 areas 3 of which are eschared over the only large areas the lower 1 on the right anterior tibia. Ideally I would probably debride the eschar off the surface of 3 of these perhaps for her although he is not allowing this. No evidence of surrounding infection. Venous insufficiency changes but edema control is good Electronic Signature(s) Signed: 06/04/2023 5:13:18 PM By: [name] MD Entered By: [name] on 06/04/2023 05:29:58 -------------------------------------------------------------------------------- Physician Orders Details Patient Name: Date of Service: [name] [name], [name] 06/04/2023 8:00 A M Medical Record Number: [id number] Patient Account Number: [id number] Date of Birth/Sex: Treating RN: [birth year] (67 y.o. [name] Primary Care Provider: Cranford Mon Other Clinician: Referring Provider: Treating Provider/Extender: [name] in Treatment: 56 Verbal / Phone Orders:  No Diagnosis Coding Follow-up Appointments ppointment in 2 weeks. - w/ DR. Irena Gaydos-schedule at front desk Return A Anesthetic (In clinic) Topical Lidocaine 4% applied to wound bed Bathing/ Shower/ Hygiene May shower and wash wound with soap and water. RUFINO, TACK ([id number]) [id number] Page 3 of 8 Wound Treatment Wound #3 - Lower Leg Wound Laterality: Right, Anterior, Proximal Cleanser: Soap and Water 1 x Per Day/30 Days Discharge Instructions: May shower and wash wound with dial antibacterial soap and water prior to dressing change. Peri-Wound Care: Triamcinolone 15 (g) 1 x Per Day/30 Days Discharge Instructions: Use triamcinolone apply to entire both legs. around the wounds. Prim Dressing: IODOFLEX 0.9% Cadexomer Iodine Pad 4x6 cm 1 x Per Day/30 Days ary Discharge Instructions: Apply to wound bed as instructed Secondary Dressing: Woven Gauze Sponge, Non-Sterile 4x4 in (Generic) 1 x Per Day/30 Days Discharge Instructions: Apply over primary dressing as directed. Secured With: [employer], 4.5x3.1 (in/yd) (Generic) 1 x Per Day/30 Days Discharge Instructions: Secure with Kerlix as directed. Secured With: 44M Medipore H Soft Cloth Surgical T ape, 4 x 10 (in/yd) (Generic) 1 x Per Day/30 Days Discharge Instructions: Secure with tape as directed. Wound #4 - Lower Leg Wound Laterality: Left, Medial, Distal Cleanser: Soap and Water 1 x Per Day/30 Days Discharge Instructions: May shower and wash wound with dial antibacterial soap and water prior to dressing change. Peri-Wound Care: Triamcinolone 15 (g) 1 x Per Day/30 Days Discharge Instructions: Use triamcinolone apply to entire both legs. around the wounds. Prim Dressing: IODOFLEX 0.9% Cadexomer  Milus Height ([id number]) [id number] Page 5 of 8 -------------------------------------------------------------------------------- Progress Note Details Patient Name: Date of Service: [name] [name]. 06/04/2023 8:00 A M Medical Record Number: [id number] Patient Account Number: [id number] Date of Birth/Sex: Treating RN: [birth year] (66 y.o. M) Primary Care Provider: Cranford Mon Other Clinician: Referring Provider: Treating Provider/Extender: [name] in Treatment: 23 Subjective History of Present Illness (HPI) 12/24/2022 Mr. Irene Willever is a 67 year old male with a past medical history of controlled type 2 diabetes on insulin, lymphedema/chronic venous insufficiency and cirrhosis that presents to the clinic for a 1 year history of nonhealing wounds to his lower extremities bilaterally. He reports the starting spontaneously. He states that he has been following with [facility] and they have been using [facility] antibiotic spray to the legs. He is not wearing compression stockings or wraps. He is also tried Medihoney and collagen in the past with little benefit. He currently denies systemic signs of infection. 5/30; patient presents for follow-up. He has been using triamcinolone cream to the periwound and Santyl to the wound beds. There has been improvement in wound healing. He denies signs of infection and has no issues or complaints today. 6/4; both legs look considerably better. He has a wound on the right lateral lower leg and the left medial lower leg. Significant hemosiderin in the right leg less so on the left. We have been using Hydrofera Blue under compression 6/13; patient presents for follow-up. We have been using antibiotic ointment with Hydrofera Blue under compression therapy. The wounds are smaller. He has no issues or complaints today. We discussed ordering juxta lite compression garment wraps T use once his wounds  heal as he has hard time putting on o compression stockings. He was agreeable with this. 6/20; patient presents for follow-up. We have been using antibiotic ointment with Hydrofera Blue under compression therapy to the lower extremities bilaterally. Wounds are smaller. He has been approved for PuraPly and was agreeable with placement today. He denies signs of infection. 6/27; patient presents for follow-up. We have been using antibiotic ointment with Hydrofera Blue under compression therapy to the left lower extremity. This wound is healed. He has his juxta lite compression wraps with him today. T the right lateral leg we have been using PuraPly under compression and that wound o is smaller today. Unfortunately he has developed skin breakdown from the Steri-Strip that was used to hold the PuraPly in place and this has created a new wound on the right leg. 7/8; patient presents for follow-up. We have been using PuraPly to the right lateral leg wound Under compression wrap. Wound is slightly smaller however original wound used for PuraPly is healed. He unfortunately developed a new wound to the left lower extremity but it looks like this was from potential scratching. He has been using his juxta lite compression here. 716; patient presents for follow-up. We have been using antibiotic ointment with Hydrofera Blue under 3 layer compression to lower extremities bilaterally. Unfortunately he has continued to develop wounds to the left lower extremity. He currently denies signs of infection. 7/22; patient presents for follow-up. He has been using Santyl and Hydrofera Blue with triamcinolone cream under her juxta lite compression wraps daily. No new wounds today. Current wounds are stable. Less irritation to the periwound. 7/29; patient presents for follow-up. He has been using Santyl and Hydrofera Blue with triamcinolone cream under the juxta lite compression wraps. He is having a hard time keeping the  Mil[facility] Height ([id number]) [id number] Page 5 of 8 -------------------------------------------------------------------------------- Progress Note Details Patient Name: Date of Service: [name] [name]. 06/04/2023 8:00 A M Medical Record Number: [id number] Patient Account Number: [id number] Date of Birth/Sex: Treating RN: [birth year] (66 y.o. M) Primary Care Provider: Cranford Mon Other Clinician: Referring Provider: Treating Provider/Extender: [name] in Treatment: 23 Subjective History of Present Illness (HPI) 12/24/2022 Mr. Irene Willever is a 67 year old male with a past medical history of controlled type 2 diabetes on insulin, lymphedema/chronic venous insufficiency and cirrhosis that presents to the clinic for a 1 year history of nonhealing wounds to his lower extremities bilaterally. He reports the starting spontaneously. He states that he has been following with [facility] and they have been using [facility] antibiotic spray to the legs. He is not wearing compression stockings or wraps. He is also tried Medihoney and collagen in the past with little benefit. He currently denies systemic signs of infection. 5/30; patient presents for follow-up. He has been using triamcinolone cream to the periwound and Santyl to the wound beds. There has been improvement in wound healing. He denies signs of infection and has no issues or complaints today. 6/4; both legs look considerably better. He has a wound on the right lateral lower leg and the left medial lower leg. Significant hemosiderin in the right leg less so on the left. We have been using Hydrofera Blue under compression 6/13; patient presents for follow-up. We have been using antibiotic ointment with Hydrofera Blue under compression therapy. The wounds are smaller. He has no issues or complaints today. We discussed ordering juxta lite compression garment wraps T use once his wounds  heal as he has hard time putting on o compression stockings. He was agreeable with this. 6/20; patient presents for follow-up. We have been using antibiotic ointment with Hydrofera Blue under compression therapy to the lower extremities bilaterally. Wounds are smaller. He has been approved for PuraPly and was agreeable with placement today. He denies signs of infection. 6/27; patient presents for follow-up. We have been using antibiotic ointment with Hydrofera Blue under compression therapy to the left lower extremity. This wound is healed. He has his juxta lite compression wraps with him today. T the right lateral leg we have been using PuraPly under compression and that wound o is smaller today. Unfortunately he has developed skin breakdown from the Steri-Strip that was used to hold the PuraPly in place and this has created a new wound on the right leg. 7/8; patient presents for follow-up. We have been using PuraPly to the right lateral leg wound Under compression wrap. Wound is slightly smaller however original wound used for PuraPly is healed. He unfortunately developed a new wound to the left lower extremity but it looks like this was from potential scratching. He has been using his juxta lite compression here. 716; patient presents for follow-up. We have been using antibiotic ointment with Hydrofera Blue under 3 layer compression to lower extremities bilaterally. Unfortunately he has continued to develop wounds to the left lower extremity. He currently denies signs of infection. 7/22; patient presents for follow-up. He has been using Santyl and Hydrofera Blue with triamcinolone cream under her juxta lite compression wraps daily. No new wounds today. Current wounds are stable. Less irritation to the periwound. 7/29; patient presents for follow-up. He has been using Santyl and Hydrofera Blue with triamcinolone cream under the juxta lite compression wraps. He is having a hard time keeping the  Lower Leg Wound Laterality: Right, Lateral, Distal Cleanser: Soap and Water 1 x Per Day/30 Days Discharge Instructions: May shower and wash wound with dial antibacterial soap and water prior to dressing  change. Peri-Wound Care: Triamcinolone 15 (g) 1 x Per Day/30 Days Discharge Instructions: Use triamcinolone apply to entire both legs. around the wounds. Prim Dressing: IODOFLEX 0.9% Cadexomer Iodine Pad 4x6 cm 1 x Per Day/30 Days ary Discharge Instructions: Apply to wound bed as instructed Secondary Dressing: Woven Gauze Sponge, Non-Sterile 4x4 in (Generic) 1 x Per Day/30 Days Discharge Instructions: Apply over primary dressing as directed. Secured With: [employer], 4.5x3.1 (in/yd) (Generic) 1 x Per Day/30 Days Discharge Instructions: Secure with Kerlix as directed. Secured With: 87M Medipore H Soft Cloth Surgical T ape, 4 x 10 (in/yd) (Generic) 1 x Per Day/30 Days Discharge Instructions: Secure with tape as directed. WOUND #6: - Lower Leg Wound Laterality: Left, Posterior, Proximal Cleanser: Soap and Water 1 x Per Day/30 Days Discharge Instructions: May shower and wash wound with dial antibacterial soap and water prior to dressing change. Peri-Wound Care: Triamcinolone 15 (g) 1 x Per Day/30 Days Discharge Instructions: Use triamcinolone apply to entire both legs. around the wounds. Prim Dressing: IODOFLEX 0.9% Cadexomer Iodine Pad 4x6 cm 1 x Per Day/30 Days ary Discharge Instructions: Apply to wound bed as instructed Secondary Dressing: Woven Gauze Sponge, Non-Sterile 4x4 in (Generic) 1 x Per Day/30 Days Discharge Instructions: Apply over primary dressing as directed. Secured With: [employer], 4.5x3.1 (in/yd) (Generic) 1 x Per Day/30 Days Discharge Instructions: Secure with Kerlix as directed. Secured With: 87M Medipore H Soft Cloth Surgical T ape, 4 x 10 (in/yd) (Generic) 1 x Per Day/30 Days Discharge Instructions: Secure with tape as directed. 1. Continue with TCA and Iodoflex gauze changing every second day 2. He wears his own juxta lite stockings 3. Follow-up in 2 weeks Electronic Signature(s) Signed: 06/04/2023 5:13:18 PM By: [name] MD Entered By:  [name] on 06/04/2023 05:30:44 -------------------------------------------------------------------------------- SuperBill Details Patient Name: Date of Service: [name] [name], [name] RRIS M. 06/04/2023 Medical Record Number: [id number] Patient Account Number: [id number] Date of Birth/Sex: Treating RN: [birth year] (67 y.o. M) Primary Care Provider: Cranford Mon Other Clinician: Referring Provider: Treating Provider/Extender: [name] in Treatment: [address], [place] M ([id number]) [id number] Page 8 of 8 Diagnosis Coding ICD-10 Codes Code Description I87.313 Chronic venous hypertension (idiopathic) with ulcer of bilateral lower extremity L97.818 Non-pressure chronic ulcer of other part of right lower leg with other specified severity L97.828 Non-pressure chronic ulcer of other part of left lower leg with other specified severity I89.0 Lymphedema, not elsewhere classified E11.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 other skin ulcer Facility Procedures : CPT4 Code: [id number] Description: 66440 - WOUND CARE VISIT-LEV 5 EST PT Modifier: Quantity: 1 Physician Procedures : CPT4 Code Description Modifier [id number] - WC PHYS LEVEL 3 - EST PT ICD-10 Diagnosis Description I87.313 Chronic venous hypertension (idiopathic) with ulcer of bilateral lower extremity L97.818 Non-pressure chronic ulcer of other part of right  lower leg with other specified severity L97.828 Non-pressure chronic ulcer of other part of left lower leg with other specified severity E11.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 other skin ulcer Quantity: 1 Electronic Signature(s) Signed: 06/04/2023 4:23:12 PM By: Fonnie Mu RN Signed: 06/04/2023 5:13:18 PM By: [name] MD Entered By: Fonnie Mu on 06/04/2023 05:59:57  Iodine Pad 4x6 cm 1 x Per Day/30 Days ary Discharge Instructions: Apply to wound bed as instructed Secondary Dressing: Woven Gauze Sponge, Non-Sterile 4x4 in (Generic) 1 x Per Day/30 Days Discharge  Instructions: Apply over primary dressing as directed. Secured With: [employer], 4.5x3.1 (in/yd) (Generic) 1 x Per Day/30 Days Discharge Instructions: Secure with Kerlix as directed. Secured With: 21M Medipore H Soft Cloth Surgical T ape, 4 x 10 (in/yd) (Generic) 1 x Per Day/30 Days Discharge Instructions: Secure with tape as directed. Wound #5 - Lower Leg Wound Laterality: Right, Lateral, Distal Cleanser: Soap and Water 1 x Per Day/30 Days Discharge Instructions: May shower and wash wound with dial antibacterial soap and water prior to dressing change. Peri-Wound Care: Triamcinolone 15 (g) 1 x Per Day/30 Days Discharge Instructions: Use triamcinolone apply to entire both legs. around the wounds. Prim Dressing: IODOFLEX 0.9% Cadexomer Iodine Pad 4x6 cm 1 x Per Day/30 Days ary Discharge Instructions: Apply to wound bed as instructed Secondary Dressing: Woven Gauze Sponge, Non-Sterile 4x4 in (Generic) 1 x Per Day/30 Days Discharge Instructions: Apply over primary dressing as directed. Secured With: [employer], 4.5x3.1 (in/yd) (Generic) 1 x Per Day/30 Days Discharge Instructions: Secure with Kerlix as directed. Secured With: 21M Medipore H Soft Cloth Surgical T ape, 4 x 10 (in/yd) (Generic) 1 x Per Day/30 Days Discharge Instructions: Secure with tape as directed. Wound #6 - Lower Leg Wound Laterality: Left, Posterior, Proximal Cleanser: Soap and Water 1 x Per Day/30 Days Discharge Instructions: May shower and wash wound with dial antibacterial soap and water prior to dressing change. Peri-Wound Care: Triamcinolone 15 (g) 1 x Per Day/30 Days Discharge Instructions: Use triamcinolone apply to entire both legs. around the wounds. Prim Dressing: IODOFLEX 0.9% Cadexomer Iodine Pad 4x6 cm 1 x Per Day/30 Days ary Discharge Instructions: Apply to wound bed as instructed Secondary Dressing: Woven Gauze Sponge, Non-Sterile 4x4 in (Generic) 1 x Per Day/30 Days Discharge Instructions:  Apply over primary dressing as directed. Secured With: [employer], 4.5x3.1 (in/yd) (Generic) 1 x Per Day/30 Days Discharge Instructions: Secure with Kerlix as directed. Secured With: 21M Medipore H Soft Cloth Surgical T ape, 4 x 10 (in/yd) (Generic) 1 x Per Day/30 Days Discharge Instructions: Secure with tape as directed. BARI, DOORLEY ([id number]) [id number] Page 4 of 8 Electronic Signature(s) Signed: 06/04/2023 4:23:12 PM By: Fonnie Mu RN Signed: 06/04/2023 5:13:18 PM By: [name] MD Entered By: Fonnie Mu on 06/04/2023 05:26:29 -------------------------------------------------------------------------------- Problem List Details Patient Name: Date of Service: [name] [name], [name] 06/04/2023 8:00 A M Medical Record Number: [id number] Patient Account Number: [id number] Date of Birth/Sex: Treating RN: 1955/08/21 (67 y.o. M) Primary Care Provider: Cranford Mon Other Clinician: Referring Provider: Treating Provider/Extender: [name] in Treatment: 23 Active Problems ICD-10 Encounter Code Description Active Date MDM Diagnosis I87.313 Chronic venous hypertension (idiopathic) with ulcer of bilateral lower extremity 12/24/2022 No Yes L97.818 Non-pressure chronic ulcer of other part of right lower leg with other specified 12/24/2022 No Yes severity L97.828 Non-pressure chronic ulcer of other part of left lower leg with other specified 12/24/2022 No Yes severity I89.0 Lymphedema, not elsewhere classified 12/24/2022 No Yes E11.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 other skin ulcer 12/24/2022 No Yes Inactive Problems ICD-10 Code Description Active Date Inactive Date 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

## 2023-06-07 NOTE — Progress Notes (Signed)
LENIS, NETTLETON ([id number]) [id number] Page 1 of 14 Visit Report for 06/04/2023 Arrival Information Details Patient Name: Date of Service: [name] 06/04/2023 8:00 A M Medical Record Number: [id number] Patient Account Number: [id number] Date of Birth/Sex: Treating RN: Jul 16, 1956 (67 y.o. [name] [name], [name] Primary Care Ludell Zacarias: [place] Mon Other Clinician: Referring Momoko Slezak: Treating Rahmah Mccamy/Extender: [name] in Treatment: 23 Visit Information History Since Last Visit Added or deleted any medications: No Patient Arrived: Ambulatory Any new allergies or adverse reactions: No Arrival Time: 08:09 Had a fall or experienced change in No Accompanied By: sister activities of daily living that may affect Transfer Assistance: None risk of falls: Patient Identification Verified: Yes Signs or symptoms of abuse/neglect since last visito No Secondary Verification Process Completed: Yes Hospitalized since last visit: No Patient Requires Transmission-Based No Implantable device outside of the clinic excluding No Precautions: cellular tissue based products placed in the center Patient Has Alerts: Yes since last visit: Patient Alerts: 10/23 ABI L0.94 R1 VVS Has Dressing in Place as Prescribed: Yes 10/23 TBI L1.2 R0.88 VVS Has Compression in Place as Prescribed: Yes Pain Present Now: No Electronic Signature(s) Signed: 06/06/2023 6:03:32 PM By: [name] RN, BSN Entered By: [name] on 06/04/2023 05:09:27 -------------------------------------------------------------------------------- Clinic Level of Care Assessment Details Patient Name: Date of Service: CA 52, [name] 06/04/2023 8:00 A M Medical Record Number: [id number] Patient Account Number: [id number] Date of Birth/Sex: Treating RN: 02/02/1956 (67 y.o. [name] [name], [name] Primary Care Deanna Wiater: [place] Mon Other Clinician: Referring Nation Cradle: Treating  Lanora Reveron/Extender: [name] in Treatment: 23 Clinic Level of Care Assessment Items TOOL 4 Quantity Score X- 1 0 Use when only an EandM is performed on FOLLOW-UP visit ASSESSMENTS - Nursing Assessment / Reassessment X- 1 10 Reassessment of Co-morbidities (includes updates in patient status) X- 1 5 Reassessment of Adherence to Treatment Plan ASSESSMENTS - Wound and Skin A ssessment / Reassessment []  - 0 Simple Wound Assessment / Reassessment - one wound X- 4 5 Complex Wound Assessment / Reassessment - multiple wounds []  - 0 Dermatologic / Skin Assessment (not related to wound area) ASSESSMENTS - Focused Assessment X- 2 5 Circumferential Edema Measurements - multi extremities []  - 0 Nutritional Assessment / Counseling / Intervention KAYMEN, ADRIAN ([id number]) [id number] Page 2 of 14 []  - 0 Lower Extremity Assessment (monofilament, tuning fork, pulses) []  - 0 Peripheral Arterial Disease Assessment (using hand held doppler) ASSESSMENTS - Ostomy and/or Continence Assessment and Care []  - 0 Incontinence Assessment and Management []  - 0 Ostomy Care Assessment and Management (repouching, etc.) PROCESS - Coordination of Care []  - 0 Simple Patient / Family Education for ongoing care X- 1 20 Complex (extensive) Patient / Family Education for ongoing care X- 1 10 Staff obtains Chiropractor, Records, T Results / Process Orders est []  - 0 Staff telephones HHA, Nursing Homes / Clarify orders / etc []  - 0 Routine Transfer to another Facility (non-emergent condition) []  - 0 Routine Hospital Admission (non-emergent condition) []  - 0 New Admissions / Manufacturing engineer / Ordering NPWT Apligraf, etc. , []  - 0 Emergency Hospital Admission (emergent condition) []  - 0 Simple Discharge Coordination X- 1 15 Complex (extensive) Discharge Coordination PROCESS - Special Needs []  - 0 Pediatric / Minor Patient Management []  -  0 Isolation Patient Management []  - 0 Hearing / Language / Visual special needs []  - 0 Assessment of Community assistance (transportation, D/C planning, etc.) []  - 0 Additional  assistance / Altered mentation []  - 0 Support Surface(s) Assessment (bed, cushion, seat, etc.) INTERVENTIONS - Wound Cleansing / Measurement []  - 0 Simple Wound Cleansing - one wound X- 4 5 Complex Wound Cleansing - multiple wounds X- 1 5 Wound Imaging (photographs - any number of wounds) []  - 0 Wound Tracing (instead of photographs) []  - 0 Simple Wound Measurement - one wound X- 4 5 Complex Wound Measurement - multiple wounds INTERVENTIONS - Wound Dressings []  - 0 Small Wound Dressing one or multiple wounds X- 4 15 Medium Wound Dressing one or multiple wounds []  - 0 Large Wound Dressing one or multiple wounds X- 1 5 Application of Medications - topical []  - 0 Application of Medications - injection INTERVENTIONS - Miscellaneous []  - 0 External ear exam []  - 0 Specimen Collection (cultures, biopsies, blood, body fluids, etc.) []  - 0 Specimen(s) / Culture(s) sent or taken to Lab for analysis []  - 0 Patient Transfer (multiple staff / Nurse, adult / Similar devices) []  - 0 Simple Staple / Suture removal (25 or less) []  - 0 Complex Staple / Suture removal (26 or more) []  - 0 Hypo / Hyperglycemic Management (close monitor of Blood Glucose) JOBIN, MONTELONGO ([id number]) [id number] Page 3 of 14 []  - 0 Ankle / Brachial Index (ABI) - do not check if billed separately X- 1 5 Vital Signs Has the patient been seen at the hospital within the last three years: Yes Total Score: 205 Level Of Care: New/Established - Level 5 Electronic Signature(s) Signed: 06/04/2023 4:23:12 PM By: Fonnie Mu RN Entered By: Fonnie Mu on 06/04/2023 05:59:48 -------------------------------------------------------------------------------- Encounter Discharge Information  Details Patient Name: Date of Service: [name], [name] 06/04/2023 8:00 A M Medical Record Number: [id number] Patient Account Number: [id number] Date of Birth/Sex: Treating RN: Aug 05, 1956 (67 y.o. [name] [name], [name] Primary Care Rashidi Loh: [place] Mon Other Clinician: Referring Deontrey Massi: Treating Ahsan Esterline/Extender: [name] in Treatment: 23 Encounter Discharge Information Items Discharge Condition: Stable Ambulatory Status: Ambulatory Discharge Destination: Home Transportation: Private Auto Accompanied By: sister Schedule Follow-up Appointment: Yes Clinical Summary of Care: Patient Declined Electronic Signature(s) Signed: 06/04/2023 4:23:12 PM By: Fonnie Mu RN Entered By: Fonnie Mu on 06/04/2023 06:00:24 -------------------------------------------------------------------------------- Lower Extremity Assessment Details Patient Name: Date of Service: CA 21, [name] 06/04/2023 8:00 A M Medical Record Number: [id number] Patient Account Number: [id number] Date of Birth/Sex: Treating RN: 1956/05/01 (67 y.o. [name] [name] Primary Care Sivan Cuello: [place] Mon Other Clinician: Referring Oneil Behney: Treating Glendal Cassaday/Extender: [name] in Treatment: 23 Edema Assessment Assessed: [name]: Yes] [name]: Yes] Edema: [Left: No] [Right: No] Calf Left: Right: Point of Measurement: 37 cm From Medial Instep 34 cm 33 cm Ankle Left: Right: Point of Measurement: 13 cm From Medial Instep 23 cm 22 cm Vascular Assessment Left: [id number] Page 4 of 14Right:] Pulses: Dorsalis Pedis Palpable: [id number] Page 4 of 14Yes Yes] Extremity colors, hair growth, and conditions: Extremity Color: [[id number] Page 4 of 14Hyperpigmented Hyperpigmented] Hair Growth on Extremity: [id number] Page 4 of 14No No] Temperature of  Extremity: [[id number] Page 4 of 14Warm Warm] Capillary Refill: [id number] Page 4 of 14< 3 seconds < 3 seconds] Dependent Rubor: [[id number].pdf Page 4 of 14No No] Blanched when Elevated: [[id number] Page 4 of 14No No Yes Yes] Toe Nail Assessment Left: Right: Thick: Yes Yes Discolored: Yes Yes Deformed: Yes Yes Improper Length and Hygiene: No No Electronic Signature(s) Signed: 06/06/2023 6:03:32 PM By: [name] RN, BSN Entered By: [name]  on 06/04/2023 05:12:31 -------------------------------------------------------------------------------- Multi Wound Chart Details Patient Name: Date of Service: [name] 06/04/2023 8:00 A M Medical Record Number: [id number] Patient Account Number: [id number] Date of Birth/Sex: Treating RN: 1955/09/07 (67 y.o. M) Primary Care Austyn Perriello: Cranford Mon Other Clinician: Referring Arissa Fagin: Treating Dagmawi Venable/Extender: [name] in Treatment: 23 Vital Signs Height(in): 72 Capillary Blood Glucose(mg/dl): 68 Weight(lbs): 409 Pulse(bpm): 61 Body Mass Index(BMI): 33.2 Blood Pressure(mmHg): 162/75 Temperature(F): 98.1 Respiratory Rate(breaths/min): 18 [3:Photos:] Right, Proximal, Anterior Lower Leg Left, Distal, Medial Lower Leg Right, Distal, Lateral Lower Leg Wound Location: Gradually Appeared Gradually Appeared Blister Wounding Event: Diabetic Wound/Ulcer of the Lower Diabetic Wound/Ulcer of the Lower Diabetic Wound/Ulcer of the Lower Primary Etiology: Extremity Extremity Extremity N/A Venous Leg Ulcer N/A Secondary Etiology: Anemia, Lymphedema, Hypertension, Anemia, Lymphedema, Hypertension, Anemia, Lymphedema, Hypertension, Comorbid History: Cirrhosis , Type II Diabetes Cirrhosis , Type II Diabetes Cirrhosis , Type II Diabetes 01/25/2023 02/11/2023 02/19/2023 Date Acquired: 17 16 15  Weeks of  Treatment: Open Open Open Wound Status: No No No Wound Recurrence: No No Yes Clustered Wound: 3 N/A 1 Clustered Quantity: 1x0.8x0.1 0.4x0.5x0.1 0.9x0.5x0.1 Measurements L x W x D (cm) 0.628 0.157 0.353 A (cm) : rea 0.063 0.016 0.035 Volume (cm) : OLIE, DIBERT ([id number]) [id number] Page 5 of 14 -784.50% 59.20% 44.50% % Reduction in A rea: -350.00% 57.90% 45.30% % Reduction in Volume: Grade 1 Grade 1 Grade 1 Classification: Medium Medium Medium Exudate A mount: Serosanguineous Serosanguineous Serosanguineous Exudate Type: red, brown red, brown red, brown Exudate Color: Distinct, outline attached Distinct, outline attached Distinct, outline attached Wound Margin: Medium (34-66%) Small (1-33%) Small (1-33%) Granulation A mount: Red, Pink Hyper-granulation Red, Pink Granulation Quality: Medium (34-66%) Large (67-100%) Large (67-100%) Necrotic A mount: Fat Layer (Subcutaneous Tissue): Yes Fat Layer (Subcutaneous Tissue): Yes Fat Layer (Subcutaneous Tissue): Yes Exposed Structures: Fascia: No Fascia: No Fascia: No Tendon: No Tendon: No Tendon: No Muscle: No Muscle: No Muscle: No Joint: No Joint: No Joint: No Bone: No Bone: No Bone: No Small (1-33%) Small (1-33%) Small (1-33%) Epithelialization: Excoriation: No Excoriation: No Excoriation: No Periwound Skin Texture: Induration: No Induration: No Induration: No Callus: No Callus: No Callus: No Crepitus: No Crepitus: No Crepitus: No Rash: No Rash: No Rash: No Scarring: No Scarring: No Scarring: No Maceration: No Maceration: No Maceration: No Periwound Skin Moisture: Dry/Scaly: No Dry/Scaly: No Dry/Scaly: No Atrophie Blanche: No Atrophie Blanche: No Atrophie Blanche: No Periwound Skin Color: Cyanosis: No Cyanosis: No Cyanosis: No Ecchymosis: No Ecchymosis: No Ecchymosis: No Erythema: No Erythema: No Erythema: No Hemosiderin Staining: No Hemosiderin  Staining: No Hemosiderin Staining: No Mottled: No Mottled: No Mottled: No Pallor: No Pallor: No Pallor: No Rubor: No Rubor: No Rubor: No No Abnormality No Abnormality No Abnormality Temperature: Wound Number: 6 N/A N/A Photos: N/A N/A Left, Proximal, Posterior Lower Leg N/A N/A Wound Location: Blister N/A N/A Wounding Event: Diabetic Wound/Ulcer of the Lower N/A N/A Primary Etiology: Extremity N/A N/A N/A Secondary Etiology: Anemia, Lymphedema, Hypertension, N/A N/A Comorbid History: Cirrhosis , Type II Diabetes 02/19/2023 N/A N/A Date Acquired: 15 N/A N/A Weeks of Treatment: Open N/A N/A Wound Status: No N/A N/A Wound Recurrence: No N/A N/A Clustered Wound: N/A N/A N/A Clustered Quantity: 0.5x0.3x0.1 N/A N/A Measurements L x W x D (cm) 0.118 N/A N/A A (cm) : rea 0.012 N/A N/A Volume (cm) : 92.80% N/A N/A % Reduction in A rea: 92.70% N/A N/A % Reduction in Volume: Grade 1 N/A N/A Classification: Medium  N/A N/A Exudate A mount: Serosanguineous N/A N/A Exudate Type: red, brown N/A N/A Exudate Color: Distinct, outline attached N/A N/A Wound Margin: None Present (0%) N/A N/A Granulation A mount: N/A N/A N/A Granulation Quality: Large (67-100%) N/A N/A Necrotic A mount: Fat Layer (Subcutaneous Tissue): Yes N/A N/A Exposed Structures: Fascia: No Tendon: No Muscle: No Joint: No Bone: No Small (1-33%) N/A N/A Epithelialization: Excoriation: No N/A N/A Periwound Skin Texture: Induration: No Callus: No Crepitus: No Rash: No Scarring: No Maceration: No N/A N/A Periwound Skin Moisture: Dry/Scaly: No JHAYDEN, DEMURO ([id number]) [id number] Page 6 of 14 Atrophie Blanche: No N/A N/A Periwound Skin Color: Cyanosis: No Ecchymosis: No Erythema: No Hemosiderin Staining: No Mottled: No Pallor: No Rubor: No No Abnormality N/A N/A Temperature: Treatment Notes Electronic Signature(s) Signed: 06/04/2023 5:13:18 PM  By: [name] MD Entered By: [name] on 06/04/2023 05:27:39 -------------------------------------------------------------------------------- Multi-Disciplinary Care Plan Details Patient Name: Date of Service: [name], [name] 06/04/2023 8:00 A M Medical Record Number: [id number] Patient Account Number: [id number] Date of Birth/Sex: Treating RN: 1955/12/27 (67 y.o. [name] [name], [name] Primary Care Simuel Stebner: [place] Mon Other Clinician: Referring Porchea Charrier: Treating Josefa Syracuse/Extender: [name] in Treatment: 23 Active Inactive Pain, Acute or Chronic Nursing Diagnoses: Pain, acute or chronic: actual or potential Potential alteration in comfort, pain Goals: Patient will verbalize adequate pain control and receive pain control interventions during procedures as needed Date Initiated: 12/24/2022 Target Resolution Date: 06/28/2023 Goal Status: Active Patient/caregiver will verbalize comfort level met Date Initiated: 12/24/2022 Date Inactivated: 01/31/2023 Target Resolution Date: 02/14/2023 Goal Status: Met Interventions: Encourage patient to take pain medications as prescribed Provide education on pain management Treatment Activities: Administer pain control measures as ordered : 12/24/2022 Notes: Electronic Signature(s) Signed: 06/04/2023 4:23:12 PM By: Fonnie Mu RN Entered By: Fonnie Mu on 06/04/2023 05:28:21 -------------------------------------------------------------------------------- Pain Assessment Details Patient Name: Date of Service: [name], [name] 06/04/2023 8:00 A Lavonna Rua, Milus Height ([id number]) [id number] Page 7 of 14 Medical Record Number: [id number] Patient Account Number: [id number] Date of Birth/Sex: Treating RN: January 18, 1956 (67 y.o. [name] [name] Primary Care Nessie Nong: [place] Mon Other Clinician: Referring Kieley Akter: Treating Tamon Parkerson/Extender: [name] in Treatment: 23 Active Problems Location of Pain Severity and Description of Pain Patient Has Paino No Site Locations Pain Management and Medication Current Pain Management: Electronic Signature(s) Signed: 06/06/2023 6:03:32 PM By: [name] RN, BSN Entered By: [name] on 06/04/2023 05:12:04 -------------------------------------------------------------------------------- Patient/Caregiver Education Details Patient Name: Date of Service: [name] 10/29/2024andnbsp8:00 A M Medical Record Number: [id number] Patient Account Number: [id number] Date of Birth/Gender: Treating RN: [birth year] (67 y.o. [name] [name] Primary Care Physician: [place] Mon Other Clinician: Referring Physician: Treating Physician/Extender: [name] in Treatment: 23 Education Assessment Education Provided To: Patient Education Topics Provided Wound/Skin Impairment: Methods: Explain/Verbal Responses: Reinforcements needed, State content correctly [name]) Signed: 06/04/2023 4:23:12 PM By: Fonnie Mu RN Entered By: Fonnie Mu on 06/04/2023 05:57:55 Cay Schillings ([id number]) [id number] Page 8 of 14 -------------------------------------------------------------------------------- Wound Assessment Details Patient Name: Date of Service: [name] 06/04/2023 8:00 A M Medical Record Number: [id number] Patient Account Number: [id number] Date of Birth/Sex: Treating RN: 04-15-1956 (67 y.o. [name] [name] Primary Care Kenosha Doster: [place] Mon Other Clinician: Referring Brownie Gockel: Treating Canda Podgorski/Extender: [name] in Treatment: 23 Wound Status Wound Number: 3 Primary Diabetic Wound/Ulcer of the Lower Extremity Etiology: Wound Location:  Right, Proximal, Anterior Lower Leg Wound Status: Open Wounding Event: Gradually Appeared Comorbid Anemia, Lymphedema,  Hypertension, Cirrhosis , Type II Date Acquired: 01/25/2023 History: Diabetes Weeks Of Treatment: 17 Clustered Wound: No Photos Wound Measurements Length: (cm) Width: (cm) Depth: (cm) Clustered Quantity: Area: (cm) Volume: (cm) 1 % Reduction in Area: -784.5% 0.8 % Reduction in Volume: -350% 0.1 Epithelialization: Small (1-33%) 3 Tunneling: No 0.628 Undermining: No 0.063 Wound Description Classification: Grade 1 Wound Margin: Distinct, outline attached Exudate Amount: Medium Exudate Type: Serosanguineous Exudate Color: red, brown Foul Odor After Cleansing: No Slough/Fibrino Yes Wound Bed Granulation Amount: Medium (34-66%) Exposed Structure Granulation Quality: Red, Pink Fascia Exposed: No Necrotic Amount: Medium (34-66%) Fat Layer (Subcutaneous Tissue) Exposed: Yes Necrotic Quality: Adherent Slough Tendon Exposed: No Muscle Exposed: No Joint Exposed: No Bone Exposed: No Periwound Skin Texture Texture Color No Abnormalities Noted: No No Abnormalities Noted: No Callus: No Atrophie Blanche: No Crepitus: No Cyanosis: No Excoriation: No Ecchymosis: No Induration: No Erythema: No Rash: No Hemosiderin Staining: No Scarring: No Mottled: No Pallor: No Moisture Rubor: No No Abnormalities Noted: No Dry / Scaly: No Temperature / Pain Maceration: No Temperature: No Abnormality KHARON, HIXON ([id number]) [id number] Page 9 of 14 Treatment Notes Wound #3 (Lower Leg) Wound Laterality: Right, Anterior, Proximal Cleanser Soap and Water Discharge Instruction: May shower and wash wound with dial antibacterial soap and water prior to dressing change. Peri-Wound Care Triamcinolone 15 (g) Discharge Instruction: Use triamcinolone apply to entire both legs. around the wounds. Topical Primary Dressing IODOFLEX 0.9% Cadexomer Iodine Pad 4x6 cm Discharge Instruction: Apply to wound bed as instructed Secondary Dressing Woven Gauze Sponge,  Non-Sterile 4x4 in Discharge Instruction: Apply over primary dressing as directed. Secured With [employer], 4.5x3.1 (in/yd) Discharge Instruction: Secure with Kerlix as directed. 16M Medipore H Soft Cloth Surgical T ape, 4 x 10 (in/yd) Discharge Instruction: Secure with tape as directed. Compression Wrap Compression Stockings Add-Ons Electronic Signature(s) Signed: 06/06/2023 6:03:32 PM By: [name] RN, BSN Entered By: [name] on 06/04/2023 05:17:10 -------------------------------------------------------------------------------- Wound Assessment Details Patient Name: Date of Service: [name], [name] 06/04/2023 8:00 A M Medical Record Number: [id number] Patient Account Number: [id number] Date of Birth/Sex: Treating RN: January 15, 1956 (67 y.o. [name] [name] Primary Care Jule Whitsel: [place] Mon Other Clinician: Referring Herschel Fleagle: Treating Rayan Dyal/Extender: [name] in Treatment: 23 Wound Status Wound Number: 4 Primary Etiology: Diabetic Wound/Ulcer of the Lower Extremity Wound Location: Left, Distal, Medial Lower Leg Secondary Venous Leg Ulcer Etiology: Wounding Event: Gradually Appeared Wound Status: Open Date Acquired: 02/11/2023 Comorbid Anemia, Lymphedema, Hypertension, Cirrhosis , Type II Weeks Of Treatment: 16 History: Diabetes Clustered Wound: No Photos NALIN, MAZZOCCO ([id number]) [phone].pdf Page 10 of 14 Wound Measurements Length: (cm) 0.4 Width: (cm) 0.5 Depth: (cm) 0.1 Area: (cm) 0.157 Volume: (cm) 0.016 % Reduction in Area: 59.2% % Reduction in Volume: 57.9% Epithelialization: Small (1-33%) Tunneling: No Undermining: No Wound Description Classification: Grade 1 Wound Margin: Distinct, outline attached Exudate Amount: Medium Exudate Type: Serosanguineous Exudate Color: red, brown Foul Odor After Cleansing: No Slough/Fibrino Yes Wound Bed Granulation Amount: Small (1-33%) Exposed  Structure Granulation Quality: Hyper-granulation Fascia Exposed: No Necrotic Amount: Large (67-100%) Fat Layer (Subcutaneous Tissue) Exposed: Yes Necrotic Quality: Adherent Slough Tendon Exposed: No Muscle Exposed: No Joint Exposed: No Bone Exposed: No Periwound Skin Texture Texture Color No Abnormalities Noted: No No Abnormalities Noted: No Callus: No Atrophie Blanche: No Crepitus: No Cyanosis: No Excoriation: No Ecchymosis: No Induration: No Erythema: No  Rash: No Hemosiderin Staining: No Scarring: No Mottled: No Pallor: No Moisture Rubor: No No Abnormalities Noted: No Dry / Scaly: No Temperature / Pain Maceration: No Temperature: No Abnormality Treatment Notes Wound #4 (Lower Leg) Wound Laterality: Left, Medial, Distal Cleanser Soap and Water Discharge Instruction: May shower and wash wound with dial antibacterial soap and water prior to dressing change. Peri-Wound Care Triamcinolone 15 (g) Discharge Instruction: Use triamcinolone apply to entire both legs. around the wounds. Topical Primary Dressing IODOFLEX 0.9% Cadexomer Iodine Pad 4x6 cm Discharge Instruction: Apply to wound bed as instructed Secondary Dressing Woven Gauze Sponge, Non-Sterile 4x4 in Discharge Instruction: Apply over primary dressing as directed. Secured With [employer], 4.5x3.1 (in/yd) LINDSEY, HOMMEL ([id number]) [id number] Page 11 of 14 Discharge Instruction: Secure with Kerlix as directed. 57M Medipore H Soft Cloth Surgical T ape, 4 x 10 (in/yd) Discharge Instruction: Secure with tape as directed. Compression Wrap Compression Stockings Add-Ons Electronic Signature(s) Signed: 06/06/2023 6:03:32 PM By: [name] RN, BSN Entered By: [name] on 06/04/2023 05:17:52 -------------------------------------------------------------------------------- Wound Assessment Details Patient Name: Date of Service: [name], [name] 06/04/2023 8:00 A  M Medical Record Number: [id number] Patient Account Number: [id number] Date of Birth/Sex: Treating RN: Aug 20, 1955 (67 y.o. [name] [name], [name] Primary Care Jazmin Vensel: [place] Mon Other Clinician: Referring Blong Busk: Treating Ketsia Linebaugh/Extender: [name] in Treatment: 23 Wound Status Wound Number: 5 Primary Diabetic Wound/Ulcer of the Lower Extremity Etiology: Wound Location: Right, Distal, Lateral Lower Leg Wound Status: Open Wounding Event: Blister Comorbid Anemia, Lymphedema, Hypertension, Cirrhosis , Type II Date Acquired: 02/19/2023 History: Diabetes Weeks Of Treatment: 15 Clustered Wound: Yes Photos Wound Measurements Length: (cm) Width: (cm) Depth: (cm) Clustered Quantity: Area: (cm) Volume: (cm) 0.9 % Reduction in Area: 44.5% 0.5 % Reduction in Volume: 45.3% 0.1 Epithelialization: Small (1-33%) 1 Tunneling: No 0.353 Undermining: No 0.035 Wound Description Classification: Grade 1 Wound Margin: Distinct, outline attached Exudate Amount: Medium Exudate Type: Serosanguineous Exudate Color: red, brown Foul Odor After Cleansing: No Slough/Fibrino Yes Wound Bed Granulation Amount: Small (1-33%) Exposed Structure Granulation Quality: Red, Pink Fascia Exposed: No Necrotic Amount: Large (67-100%) Fat Layer (Subcutaneous Tissue) Exposed: Yes Necrotic Quality: Adherent Slough Tendon Exposed: No Muscle Exposed: No MAHAD, NEWSTROM ([id number]) [id number] Page 12 of 14 Joint Exposed: No Bone Exposed: No Periwound Skin Texture Texture Color No Abnormalities Noted: No No Abnormalities Noted: No Callus: No Atrophie Blanche: No Crepitus: No Cyanosis: No Excoriation: No Ecchymosis: No Induration: No Erythema: No Rash: No Hemosiderin Staining: No Scarring: No Mottled: No Pallor: No Moisture Rubor: No No Abnormalities Noted: No Dry / Scaly: No Temperature / Pain Maceration: No Temperature: No  Abnormality Treatment Notes Wound #5 (Lower Leg) Wound Laterality: Right, Lateral, Distal Cleanser Soap and Water Discharge Instruction: May shower and wash wound with dial antibacterial soap and water prior to dressing change. Peri-Wound Care Triamcinolone 15 (g) Discharge Instruction: Use triamcinolone apply to entire both legs. around the wounds. Topical Primary Dressing IODOFLEX 0.9% Cadexomer Iodine Pad 4x6 cm Discharge Instruction: Apply to wound bed as instructed Secondary Dressing Woven Gauze Sponge, Non-Sterile 4x4 in Discharge Instruction: Apply over primary dressing as directed. Secured With [employer], 4.5x3.1 (in/yd) Discharge Instruction: Secure with Kerlix as directed. 57M Medipore H Soft Cloth Surgical T ape, 4 x 10 (in/yd) Discharge Instruction: Secure with tape as directed. Compression Wrap Compression Stockings Add-Ons Electronic Signature(s) Signed: 06/06/2023 6:03:32 PM By: [name] RN, BSN Entered By: [name] on 06/04/2023 05:15:53 -------------------------------------------------------------------------------- Wound  Assessment Details Patient Name: Date of Service: [name] 06/04/2023 8:00 A M Medical Record Number: [id number] Patient Account Number: [id number] Date of Birth/Sex: Treating RN: 10/22/1955 (67 y.o. [name] [name] Primary Care Shakura Cowing: [place] Mon Other Clinician: Referring Sanora Cunanan: Treating Ewan Grau/Extender: [name] in Treatment: 23 Wound Status Wound Number: 6 Primary Diabetic Wound/Ulcer of the Lower Extremity Etiology: Wound Location: Left, Proximal, Posterior Lower Leg KOBEN, DAMAN ([id number]) [id number] Page 13 of 14 Wound Status: Open Wounding Event: Blister Comorbid Anemia, Lymphedema, Hypertension, Cirrhosis , Type II Date Acquired: 02/19/2023 History: Diabetes Weeks Of Treatment: 15 Clustered Wound: No Photos Wound  Measurements Length: (cm) 0.5 Width: (cm) 0.3 Depth: (cm) 0.1 Area: (cm) 0.118 Volume: (cm) 0.012 % Reduction in Area: 92.8% % Reduction in Volume: 92.7% Epithelialization: Small (1-33%) Tunneling: No Undermining: No Wound Description Classification: Grade 1 Wound Margin: Distinct, outline attached Exudate Amount: Medium Exudate Type: Serosanguineous Exudate Color: red, brown Foul Odor After Cleansing: No Slough/Fibrino Yes Wound Bed Granulation Amount: None Present (0%) Exposed Structure Necrotic Amount: Large (67-100%) Fascia Exposed: No Necrotic Quality: Adherent Slough Fat Layer (Subcutaneous Tissue) Exposed: Yes Tendon Exposed: No Muscle Exposed: No Joint Exposed: No Bone Exposed: No Periwound Skin Texture Texture Color No Abnormalities Noted: No No Abnormalities Noted: No Callus: No Atrophie Blanche: No Crepitus: No Cyanosis: No Excoriation: No Ecchymosis: No Induration: No Erythema: No Rash: No Hemosiderin Staining: No Scarring: No Mottled: No Pallor: No Moisture Rubor: No No Abnormalities Noted: No Dry / Scaly: No Temperature / Pain Maceration: No Temperature: No Abnormality Treatment Notes Wound #6 (Lower Leg) Wound Laterality: Left, Posterior, Proximal Cleanser Soap and Water Discharge Instruction: May shower and wash wound with dial antibacterial soap and water prior to dressing change. Peri-Wound Care Triamcinolone 15 (g) Discharge Instruction: Use triamcinolone apply to entire both legs. around the wounds. Topical Primary Dressing IODOFLEX 0.9% Cadexomer Iodine Pad 4x6 cm Discharge Instruction: Apply to wound bed as instructed KEYANDRE, PILEGGI ([id number]) [id number] Page 14 of 14 Secondary Dressing Woven Gauze Sponge, Non-Sterile 4x4 in Discharge Instruction: Apply over primary dressing as directed. Secured With [employer], 4.5x3.1 (in/yd) Discharge Instruction: Secure with Kerlix as directed. 72M  Medipore H Soft Cloth Surgical T ape, 4 x 10 (in/yd) Discharge Instruction: Secure with tape as directed. Compression Wrap Compression Stockings Add-Ons Electronic Signature(s) Signed: 06/06/2023 6:03:32 PM By: [name] RN, BSN Entered By: [name] on 06/04/2023 05:16:36 -------------------------------------------------------------------------------- Vitals Details Patient Name: Date of Service: [name], [name] 06/04/2023 8:00 A M Medical Record Number: [id number] Patient Account Number: [id number] Date of Birth/Sex: Treating RN: 09-Jun-1956 (67 y.o. [name] [name], [name] Primary Care Alyxis Grippi: [place] Mon Other Clinician: Referring Lanyiah Brix: Treating Viveca Beckstrom/Extender: [name] in Treatment: 23 Vital Signs Time Taken: 08:09 Temperature (F): 98.1 Height (in): 72 Pulse (bpm): 61 Weight (lbs): 245 Respiratory Rate (breaths/min): 18 Body Mass Index (BMI): 33.2 Blood Pressure (mmHg): 162/75 Capillary Blood Glucose (mg/dl): 68 Reference Range: 80 - 120 mg / dl Electronic Signature(s) Signed: 06/06/2023 6:03:32 PM By: [name] RN, BSN Entered By: [name] on 06/04/2023 05:11:58

## 2023-06-10 ENCOUNTER — Ambulatory Visit (HOSPITAL_BASED_OUTPATIENT_CLINIC_OR_DEPARTMENT_OTHER): Payer: Medicare Other | Admitting: Internal Medicine

## 2023-06-18 ENCOUNTER — Encounter (HOSPITAL_BASED_OUTPATIENT_CLINIC_OR_DEPARTMENT_OTHER): Payer: Medicare Other | Attending: Internal Medicine | Admitting: Internal Medicine

## 2023-06-18 DIAGNOSIS — I10 Essential (primary) hypertension: Secondary | ICD-10-CM | POA: Insufficient documentation

## 2023-06-18 DIAGNOSIS — I872 Venous insufficiency (chronic) (peripheral): Secondary | ICD-10-CM | POA: Insufficient documentation

## 2023-06-18 DIAGNOSIS — E11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 other skin ulcer: Secondary | ICD-10-CM | POA: Diagnosis present

## 2023-06-18 DIAGNOSIS — D649 Anemia, unspecified: Secondary | ICD-10-CM | POA: Insufficient documentation

## 2023-06-18 DIAGNOSIS — L97828 Non-pressure chronic ulcer of other part of left lower leg with other specified severity: Secondary | ICD-10-CM | POA: Insufficient documentation

## 2023-06-18 DIAGNOSIS — L97818 Non-pressure chronic ulcer of other part of right lower leg with other specified severity: Secondary | ICD-10-CM | POA: Insufficient documentation

## 2023-06-18 DIAGNOSIS — K7460 Unspecified cirrhosis of liver: Secondary | ICD-10-CM | POA: Diagnosis not present

## 2023-06-18 DIAGNOSIS — Z794 Long term (current) use of insulin: Secondary | ICD-10-CM | POA: Insufficient documentation

## 2023-06-18 DIAGNOSIS — I890 Lymphedema, not elsewhere classified: Secondary | ICD-10-CM | POA: Diagnosis not present

## 2023-06-18 DIAGNOSIS — E1151 Type 2 diabetes mellitus with diabetic peripheral angiopathy without gangrene: Secondary | ICD-10-CM | POA: Diagnosis not present

## 2023-06-19 NOTE — Progress Notes (Signed)
DAIQUON, QUINLIN ([id number]) [id number] Page 1 of 14 Visit Report for 06/18/2023 Arrival Information Details Patient Name: Date of Service: [name] [name]. 06/18/2023 8:00 A M Medical Record Number: [id number] Patient Account Number: [id number] Date of Birth/Sex: Treating RN: 05-12-1956 (67 y.o. [name], [name] Primary Care Daliana Leverett: [place] Mon Other Clinician: Referring Duyen Beckom: Treating Teal Bontrager/Extender: [name] in Treatment: 25 Visit Information History Since Last Visit Added or deleted any medications: No Patient Arrived: Ambulatory Any new allergies or adverse reactions: No Arrival Time: 07:56 Had a fall or experienced change in No Accompanied By: sister activities of daily living that may affect Transfer Assistance: None risk of falls: Patient Identification Verified: Yes Signs or symptoms of abuse/neglect since last visito No Secondary Verification Process Completed: Yes Hospitalized since last visit: No Patient Requires Transmission-Based No Implantable device outside of the clinic excluding No Precautions: cellular tissue based products placed in the center Patient Has Alerts: Yes since last visit: Patient Alerts: 10/23 ABI L0.94 R1 VVS Has Dressing in Place as Prescribed: Yes 10/23 TBI L1.2 R0.88 VVS Has Compression in Place as Prescribed: Yes Pain Present Now: Yes Electronic Signature(s) Signed: 06/18/2023 5:16:35 PM By: [name] RN, BSN Entered By: [name] on 06/18/2023 04:56:50 -------------------------------------------------------------------------------- Encounter Discharge Information Details Patient Name: Date of Service: [name] [name], [name] 06/18/2023 8:00 A M Medical Record Number: [id number] Patient Account Number: [id number] Date of Birth/Sex: Treating RN: 1956/05/31 (67 y.o. [name] Primary Care Agustina Witzke: [place] Mon Other Clinician: Referring Macrina Lehnert: Treating  Sonny Anthes/Extender: [name] in Treatment: 25 Encounter Discharge Information Items Post Procedure Vitals Discharge Condition: Stable Temperature (F): 98.1 Ambulatory Status: Ambulatory Pulse (bpm): 65 Discharge Destination: Home Respiratory Rate (breaths/min): 20 Transportation: Private Auto Blood Pressure (mmHg): 139/77 Accompanied By: sister Schedule Follow-up Appointment: Yes Clinical Summary of Care: Electronic Signature(s) Signed: 06/18/2023 5:16:35 PM By: [name] RN, BSN Entered By: [name] on 06/18/2023 05:18:58 [name] [name] ([id number]) [id number] Page 2 of 14 -------------------------------------------------------------------------------- Lower Extremity Assessment Details Patient Name: Date of Service: [name] [name]. 06/18/2023 8:00 A M Medical Record Number: [id number] Patient Account Number: [id number] Date of Birth/Sex: Treating RN: 1955/10/11 (67 y.o. [name] Primary Care Rakin Lemelle: [place] Mon Other Clinician: Referring Deniese Oberry: Treating Alonnah Lampkins/Extender: [name] in Treatment: 25 Edema Assessment Assessed: [name]: Yes] [name]: Yes] Edema: [Left: No] [Right: No] Calf Left: Right: Point of Measurement: 37 cm From Medial Instep 34 cm 32 cm Ankle Left: Right: Point of Measurement: 13 cm From Medial Instep 23 cm 22 cm Vascular Assessment Pulses: Dorsalis Pedis Palpable: [Left:Yes] [Right:Yes] Extremity colors, hair growth, and conditions: Extremity Color: [Left:Hyperpigmented] [Right:Hyperpigmented] Hair Growth on Extremity: [Left:No] [Right:No] Temperature of Extremity: [Left:Warm] [Right:Warm] Capillary Refill: [Left:< 3 seconds] [Right:< 3 seconds] Dependent Rubor: [Left:No] [Right:No] Blanched when Elevated: [Left:No Yes] [Right:No Yes] Toe Nail Assessment Left: Right: Thick: Yes Yes Discolored: Yes Yes Deformed: Yes Yes Improper Length and  Hygiene: No No Electronic Signature(s) Signed: 06/18/2023 5:16:35 PM By: [name] RN, BSN Entered By: [name] on 06/18/2023 04:59:15 -------------------------------------------------------------------------------- Multi Wound Chart Details Patient Name: Date of Service: [name] [name], [name] 06/18/2023 8:00 A M Medical Record Number: [id number] Patient Account Number: [id number] Date of Birth/Sex: Treating RN: 03-22-1956 (67 y.o. M) Primary Care Jemila Camille: [place]ford Mon Other Clinician: Referring Alainah Phang: Treating Jaasia Viglione/Extender: [name] in Treatment: 25 Vital Signs Height(in): 72 Capillary Blood  Glucose(mg/dl): 91 Weight(lbs): 563 Pulse(bpm): [address] M ([id number]) [id number] Page 3 of 14 Body Mass Index(BMI): 33.2 Blood Pressure(mmHg): 139/77 Temperature(F): 98.1 Respiratory Rate(breaths/min): 20 [3:Photos:] Right, Proximal, Anterior Lower Leg Left, Distal, Medial Lower Leg Right, Distal, Lateral Lower Leg Wound Location: Gradually Appeared Gradually Appeared Blister Wounding Event: Diabetic Wound/Ulcer of the Lower Diabetic Wound/Ulcer of the Lower Diabetic Wound/Ulcer of the Lower Primary Etiology: Extremity Extremity Extremity N/A Venous Leg Ulcer N/A Secondary Etiology: Anemia, Lymphedema, Hypertension, Anemia, Lymphedema, Hypertension, Anemia, Lymphedema, Hypertension, Comorbid History: Cirrhosis , Type II Diabetes Cirrhosis , Type II Diabetes Cirrhosis , Type II Diabetes 01/25/2023 02/11/2023 02/19/2023 Date Acquired: 19 18 17  Weeks of Treatment: Open Open Open Wound Status: No No No Wound Recurrence: No No Yes Clustered Wound: 3 N/A N/A Clustered Quantity: 1.1x1x0.3 0.5x0.5x0.1 1.3x0.6x0.2 Measurements L x W x D (cm) 0.864 0.196 0.613 A (cm) : rea 0.259 0.02 0.123 Volume (cm) : -1116.90% 49.10% 3.60% % Reduction in A rea: -1750.00% 47.40% -92.20% % Reduction in Volume: Grade 1  Grade 1 Grade 1 Classification: Medium Medium Medium Exudate A mount: Serosanguineous Serosanguineous Serosanguineous Exudate Type: red, brown red, brown red, brown Exudate Color: Distinct, outline attached Distinct, outline attached N/A Wound Margin: Large (67-100%) None Present (0%) N/A Granulation A mount: Red, Pink N/A N/A Granulation Quality: Small (1-33%) Large (67-100%) N/A Necrotic A mount: Fat Layer (Subcutaneous Tissue): Yes Fat Layer (Subcutaneous Tissue): Yes N/A Exposed Structures: Fascia: No Fascia: No Tendon: No Tendon: No Muscle: No Muscle: No Joint: No Joint: No Bone: No Bone: No Small (1-33%) Small (1-33%) N/A Epithelialization: N/A Debridement - Excisional N/A Debridement: Pre-procedure Verification/Time Out N/A 08:10 N/A Taken: N/A Lidocaine 4% Topical Solution N/A Pain Control: N/A Subcutaneous, Slough N/A Tissue Debrided: N/A Skin/Subcutaneous Tissue N/A Level: N/A 0.2 N/A Debridement A (sq cm): rea N/A Curette N/A Instrument: N/A Minimum N/A Bleeding: N/A Pressure N/A Hemostasis A chieved: N/A 0 N/A Procedural Pain: N/A 0 N/A Post Procedural Pain: N/A Procedure was tolerated well N/A Debridement Treatment Response: N/A 0.5x0.5x0.2 N/A Post Debridement Measurements L x W x D (cm) N/A 0.039 N/A Post Debridement Volume: (cm) Excoriation: No Excoriation: No Periwound Skin Texture: Induration: No Induration: No Callus: No Callus: No Crepitus: No Crepitus: No Rash: No Rash: No Scarring: No Scarring: No Maceration: No Maceration: No Periwound Skin Moisture: Dry/Scaly: No Dry/Scaly: No Atrophie Blanche: No Atrophie Blanche: No Periwound Skin Color: Cyanosis: No Cyanosis: No Ecchymosis: No Ecchymosis: No Erythema: No Erythema: No Hemosiderin Staining: No Hemosiderin Staining: No Mottled: No Mottled: No Pallor: No Pallor: No Rubor: No Rubor: No No Abnormality No Abnormality N/A Temperature: N/A Debridement  N/A Procedures Performed: Wound Number: 6 N/A N/A [name] [name], [name] [name] ([id number]) [id number] Page 4 of 14 Photos: N/A N/A Left, Proximal, Posterior Lower Leg N/A N/A Wound Location: Blister N/A N/A Wounding Event: Diabetic Wound/Ulcer of the Lower N/A N/A Primary Etiology: Extremity N/A N/A N/A Secondary Etiology: Anemia, Lymphedema, Hypertension, N/A N/A Comorbid History: Cirrhosis , Type II Diabetes 02/19/2023 N/A N/A Date A cquired: 22 N/A N/A Weeks of Treatment: Open N/A N/A Wound Status: No N/A N/A Wound Recurrence: No N/A N/A Clustered Wound: N/A N/A N/A Clustered Quantity: 0x0x0 N/A N/A Measurements L x W x D (cm) 0 N/A N/A A (cm) : rea 0 N/A N/A Volume (cm) : 100.00% N/A N/A % Reduction in A rea: 100.00% N/A N/A % Reduction in Volume: Grade 1 N/A N/A Classification: None Present N/A N/A Exudate A mount: N/A N/A N/A Exudate Type: N/A  N/A N/A Exudate Color: Distinct, outline attached N/A N/A Wound Margin: None Present (0%) N/A N/A Granulation A mount: N/A N/A N/A Granulation Quality: None Present (0%) N/A N/A Necrotic A mount: Fascia: No N/A N/A Exposed Structures: Fat Layer (Subcutaneous Tissue): No Tendon: No Muscle: No Joint: No Bone: No Large (67-100%) N/A N/A Epithelialization: N/A N/A N/A Debridement: N/A N/A N/A Pain Control: N/A N/A N/A Tissue Debrided: N/A N/A N/A Level: N/A N/A N/A Debridement A (sq cm): rea N/A N/A N/A Instrument: N/A N/A N/A Bleeding: N/A N/A N/A Hemostasis A chieved: N/A N/A N/A Procedural Pain: N/A N/A N/A Post Procedural Pain: Debridement Treatment Response: N/A N/A N/A Post Debridement Measurements L x N/A N/A N/A W x D (cm) N/A N/A N/A Post Debridement Volume: (cm) Excoriation: No N/A N/A Periwound Skin Texture: Induration: No Callus: No Crepitus: No Rash: No Scarring: No Maceration: No N/A N/A Periwound Skin Moisture: Dry/Scaly: No Atrophie Blanche: No  N/A N/A Periwound Skin Color: Cyanosis: No Ecchymosis: No Erythema: No Hemosiderin Staining: No Mottled: No Pallor: No Rubor: No No Abnormality N/A N/A Temperature: N/A N/A N/A Procedures Performed: Treatment Notes Wound #3 (Lower Leg) Wound Laterality: Right, Anterior, Proximal Cleanser Soap and Water Discharge Instruction: May shower and wash wound with dial antibacterial soap and water prior to dressing change. [name] [name], [name] [name] ([id number]) [id number] Page 5 of 14 Peri-Wound Care Triamcinolone 15 (g) Discharge Instruction: Use triamcinolone apply to entire both legs. around the wounds. Topical Triamcinolone Discharge Instruction: Apply Triamcinolone as directed Primary Dressing Endoform 2x2 in Discharge Instruction: Moisten with saline or [place] Jelly Secondary Dressing Woven Gauze Sponge, Non-Sterile 4x4 in Discharge Instruction: Apply over primary dressing as directed. Secured With [employer], 4.5x3.1 (in/yd) Discharge Instruction: Secure with Kerlix as directed. 54M Medipore H Soft Cloth Surgical T ape, 4 x 10 (in/yd) Discharge Instruction: Secure with tape as directed. Compression Wrap Compression Stockings Add-Ons Wound #4 (Lower Leg) Wound Laterality: Left, Medial, Distal Cleanser Soap and Water Discharge Instruction: May shower and wash wound with dial antibacterial soap and water prior to dressing change. Peri-Wound Care Triamcinolone 15 (g) Discharge Instruction: Use triamcinolone apply to entire both legs. around the wounds. Topical Triamcinolone Discharge Instruction: Apply Triamcinolone as directed Primary Dressing Endoform 2x2 in Discharge Instruction: Moisten with saline or [place] Jelly Secondary Dressing Woven Gauze Sponge, Non-Sterile 4x4 in Discharge Instruction: Apply over primary dressing as directed. Secured With [employer], 4.5x3.1 (in/yd) Discharge Instruction: Secure with Kerlix as directed. 54M  Medipore H Soft Cloth Surgical T ape, 4 x 10 (in/yd) Discharge Instruction: Secure with tape as directed. Compression Wrap Compression Stockings Add-Ons Wound #5 (Lower Leg) Wound Laterality: Right, Lateral, Distal Cleanser Soap and Water Discharge Instruction: May shower and wash wound with dial antibacterial soap and water prior to dressing change. Peri-Wound Care Triamcinolone 15 (g) Discharge Instruction: Use triamcinolone apply to entire both legs. around the wounds. Topical Triamcinolone [name] [name], [name] [name] ([id number]) [id number] Page 6 of 14 Discharge Instruction: Apply Triamcinolone as directed Primary Dressing Endoform 2x2 in Discharge Instruction: Moisten with saline or [place] Jelly Secondary Dressing Woven Gauze Sponge, Non-Sterile 4x4 in Discharge Instruction: Apply over primary dressing as directed. Secured With [employer], 4.5x3.1 (in/yd) Discharge Instruction: Secure with Kerlix as directed. 54M Medipore H Soft Cloth Surgical T ape, 4 x 10 (in/yd) Discharge Instruction: Secure with tape as directed. Compression Wrap Compression Stockings Add-Ons Wound #6 (Lower Leg) Wound Laterality: Left, Posterior, Proximal Cleanser Peri-Wound Care Topical Primary Dressing Secondary Dressing Secured With Compression Wrap  Compression Stockings Add-Ons Electronic Signature(s) Signed: 06/19/2023 10:44:47 AM By: [name] MD Entered By: [name] on 06/18/2023 05:20:40 -------------------------------------------------------------------------------- Multi-Disciplinary Care Plan Details Patient Name: Date of Service: [name] [name], [name] 06/18/2023 8:00 A M Medical Record Number: [id number] Patient Account Number: [id number] Date of Birth/Sex: Treating RN: 1956/05/13 (67 y.o. [name] Primary Care Giovana Faciane: [place] Mon Other Clinician: Referring Layza Summa: Treating Jamar Casagrande/Extender: [name] in  Treatment: 25 Active Inactive Pain, Acute or Chronic Nursing Diagnoses: Pain, acute or chronic: actual or potential Potential alteration in comfort, pain Goals: Patient will verbalize adequate pain control and receive pain control interventions during procedures as needed Date Initiated: 12/24/2022 Target Resolution Date: 06/28/2023 OTHNIEL, URCIUOLI ([id number]) [id number] Page 7 of 14 Goal Status: Active Patient/caregiver will verbalize comfort level met Date Initiated: 12/24/2022 Date Inactivated: 01/31/2023 Target Resolution Date: 02/14/2023 Goal Status: Met Interventions: Encourage patient to take pain medications as prescribed Provide education on pain management Treatment Activities: Administer pain control measures as ordered : 12/24/2022 Notes: Electronic Signature(s) Signed: 06/18/2023 5:16:35 PM By: [name] RN, BSN Entered By: [name] on 06/18/2023 05:11:54 -------------------------------------------------------------------------------- Pain Assessment Details Patient Name: Date of Service: [name] [name], [name] 06/18/2023 8:00 A M Medical Record Number: [id number] Patient Account Number: [id number] Date of Birth/Sex: Treating RN: Aug 09, 1955 (67 y.o. [name] Primary Care Helyn Schwan: [place] Mon Other Clinician: Referring Bertram Haddix: Treating Nikiya Starn/Extender: [name] in Treatment: 25 Active Problems Location of Pain Severity and Description of Pain Patient Has Paino Yes Site Locations Pain Management and Medication Current Pain Management: Notes at times with touched or pressure to area. Electronic Signature(s) Signed: 06/18/2023 5:16:35 PM By: [name] RN, BSN Entered By: [name] on 06/18/2023 05:11:24 [name] [name] ([id number]) [id number] Page 8 of 14 -------------------------------------------------------------------------------- Patient/Caregiver  Education Details Patient Name: Date of Service: [name] [name]. 11/12/2024andnbsp8:00 A M Medical Record Number: [id number] Patient Account Number: [id number] Date of Birth/Gender: Treating RN: 1955/11/20 (67 y.o. [name] Primary Care Physician: [place] Mon Other Clinician: Referring Physician: Treating Physician/Extender: [name] in Treatment: 25 Education Assessment Education Provided To: Patient Education Topics Provided Wound/Skin Impairment: Handouts: Caring for Your Ulcer Methods: Explain/Verbal Responses: Reinforcements needed Electronic Signature(s) Signed: 06/18/2023 5:16:35 PM By: [name] RN, BSN Entered By: [name] on 06/18/2023 05:12:12 -------------------------------------------------------------------------------- Wound Assessment Details Patient Name: Date of Service: [name] [name], [name] 06/18/2023 8:00 A M Medical Record Number: [id number] Patient Account Number: [id number] Date of Birth/Sex: Treating RN: March 27, 1956 (67 y.o. [name] Primary Care Makaiyah Schweiger: [place] Mon Other Clinician: Referring Donnalee Cellucci: Treating Amirra Herling/Extender: [name] in Treatment: 25 Wound Status Wound Number: 3 Primary Diabetic Wound/Ulcer of the Lower Extremity Etiology: Wound Location: Right, Proximal, Anterior Lower Leg Wound Status: Open Wounding Event: Gradually Appeared Comorbid Anemia, Lymphedema, Hypertension, Cirrhosis , Type II Date Acquired: 01/25/2023 History: Diabetes Weeks Of Treatment: 19 Clustered Wound: No Photos Wound Measurements Length: (cm) WASH, MULLALY ([id number]) Width: (cm) Depth: (cm) Clustered Quantity: Area: (cm) Volume: (cm) 1.1 % Reduction in Area: -1116.9% [id number] Page 9 of 14 1 % Reduction in Volume: -1750% 0.3 Epithelialization: Small (1-33%) 3 Tunneling: No 0.864 Undermining: No 0.259 Wound  Description Classification: Grade 1 Wound Margin: Distinct, outline attached Exudate Amount: Medium Exudate Type: Serosanguineous Exudate Color: red, brown Foul Odor After Cleansing: No Slough/Fibrino Yes Wound Bed Granulation Amount: Large (67-100%) Exposed Structure Granulation  Quality: Red, Pink Fascia Exposed: No Necrotic Amount: Small (1-33%) Fat Layer (Subcutaneous Tissue) Exposed: Yes Necrotic Quality: Adherent Slough Tendon Exposed: No Muscle Exposed: No Joint Exposed: No Bone Exposed: No Periwound Skin Texture Texture Color No Abnormalities Noted: No No Abnormalities Noted: No Callus: No Atrophie Blanche: No Crepitus: No Cyanosis: No Excoriation: No Ecchymosis: No Induration: No Erythema: No Rash: No Hemosiderin Staining: No Scarring: No Mottled: No Pallor: No Moisture Rubor: No No Abnormalities Noted: No Dry / Scaly: No Temperature / Pain Maceration: No Temperature: No Abnormality Treatment Notes Wound #3 (Lower Leg) Wound Laterality: Right, Anterior, Proximal Cleanser Soap and Water Discharge Instruction: May shower and wash wound with dial antibacterial soap and water prior to dressing change. Peri-Wound Care Triamcinolone 15 (g) Discharge Instruction: Use triamcinolone apply to entire both legs. around the wounds. Topical Triamcinolone Discharge Instruction: Apply Triamcinolone as directed Primary Dressing Endoform 2x2 in Discharge Instruction: Moisten with saline or [place] Jelly Secondary Dressing Woven Gauze Sponge, Non-Sterile 4x4 in Discharge Instruction: Apply over primary dressing as directed. Secured With [employer], 4.5x3.1 (in/yd) Discharge Instruction: Secure with Kerlix as directed. 60M Medipore H Soft Cloth Surgical T ape, 4 x 10 (in/yd) Discharge Instruction: Secure with tape as directed. Compression Wrap Compression Stockings Add-Ons Electronic Signature(s) Signed: 06/18/2023 8:36:51 AM By: Elsworth Soho, Milus Height ([id number]) [id number] Page 10 of 14 Signed: 06/18/2023 5:16:35 PM By: [name] RN, BSN Entered By: Dayton Scrape on 06/18/2023 05:04:29 -------------------------------------------------------------------------------- Wound Assessment Details Patient Name: Date of Service: [name] [name], [name] 06/18/2023 8:00 A M Medical Record Number: [id number] Patient Account Number: [id number] Date of Birth/Sex: Treating RN: [birth year] (67 y.o. [name], [name] Primary Care Iyahna Obriant: [place] Mon Other Clinician: Referring Chrisy Hillebrand: Treating Armin Yerger/Extender: [name] in Treatment: 25 Wound Status Wound Number: 4 Primary Etiology: Diabetic Wound/Ulcer of the Lower Extremity Wound Location: Left, Distal, Medial Lower Leg Secondary Venous Leg Ulcer Etiology: Wounding Event: Gradually Appeared Wound Status: Open Date Acquired: 02/11/2023 Comorbid Anemia, Lymphedema, Hypertension, Cirrhosis , Type II Weeks Of Treatment: 18 History: Diabetes Clustered Wound: No Photos Wound Measurements Length: (cm) 0.5 Width: (cm) 0.5 Depth: (cm) 0.1 Area: (cm) 0.196 Volume: (cm) 0.02 % Reduction in Area: 49.1% % Reduction in Volume: 47.4% Epithelialization: Small (1-33%) Tunneling: No Undermining: No Wound Description Classification: Grade 1 Wound Margin: Distinct, outline attached Exudate Amount: Medium Exudate Type: Serosanguineous Exudate Color: red, brown Foul Odor After Cleansing: No Slough/Fibrino Yes Wound Bed Granulation Amount: None Present (0%) Exposed Structure Necrotic Amount: Large (67-100%) Fascia Exposed: No Necrotic Quality: Adherent Slough Fat Layer (Subcutaneous Tissue) Exposed: Yes Tendon Exposed: No Muscle Exposed: No Joint Exposed: No Bone Exposed: No Periwound Skin Texture Texture Color No Abnormalities Noted: No No Abnormalities Noted: No Callus: No Atrophie Blanche: No Crepitus: No Cyanosis: No Excoriation:  No Ecchymosis: No Induration: No Erythema: No Rash: No Hemosiderin Staining: No Scarring: No Mottled: No Pallor: No [name] [name], [name] [name] ([id number]) [id number] Page 11 of 14 Pallor: No Moisture Rubor: No No Abnormalities Noted: No Dry / Scaly: No Temperature / Pain Maceration: No Temperature: No Abnormality Treatment Notes Wound #4 (Lower Leg) Wound Laterality: Left, Medial, Distal Cleanser Soap and Water Discharge Instruction: May shower and wash wound with dial antibacterial soap and water prior to dressing change. Peri-Wound Care Triamcinolone 15 (g) Discharge Instruction: Use triamcinolone apply to entire both legs. around the wounds. Topical Triamcinolone Discharge Instruction: Apply Triamcinolone as directed Primary Dressing Endoform 2x2 in Discharge Instruction: Moisten with saline  or [place] Jelly Secondary Dressing Woven Gauze Sponge, Non-Sterile 4x4 in Discharge Instruction: Apply over primary dressing as directed. Secured With [employer], 4.5x3.1 (in/yd) Discharge Instruction: Secure with Kerlix as directed. 44M Medipore H Soft Cloth Surgical T ape, 4 x 10 (in/yd) Discharge Instruction: Secure with tape as directed. Compression Wrap Compression Stockings Add-Ons Electronic Signature(s) Signed: 06/18/2023 8:36:51 AM By: Dayton Scrape Signed: 06/18/2023 5:16:35 PM By: [name] RN, BSN Entered By: Dayton Scrape on 06/18/2023 05:03:34 -------------------------------------------------------------------------------- Wound Assessment Details Patient Name: Date of Service: [name] [name], [name] 06/18/2023 8:00 A M Medical Record Number: [id number] Patient Account Number: [id number] Date of Birth/Sex: Treating RN: 02/16/1956 (67 y.o. [name] Primary Care Gaven Eugene: Cranford Mon Other Clinician: Referring Keirstyn Aydt: Treating Alaira Level/Extender: [name] in Treatment: 25 Wound Status Wound Number: 5  Primary Diabetic Wound/Ulcer of the Lower Extremity Etiology: Wound Location: Right, Distal, Lateral Lower Leg Wound Status: Open Wounding Event: Blister Comorbid Anemia, Lymphedema, Hypertension, Cirrhosis , Type II Date Acquired: 02/19/2023 History: Diabetes Weeks Of Treatment: 17 Clustered Wound: Yes Photos [name] [name], [name] [name] ([id number]) [id number] Page 12 of 14 Wound Measurements Length: (cm) 1.3 Width: (cm) 0.6 Depth: (cm) 0.2 Area: (cm) 0.613 Volume: (cm) 0.123 % Reduction in Area: 3.6% % Reduction in Volume: -92.2% Wound Description Classification: Grade 1 Exudate Amount: Medium Exudate Type: Serosanguineous Exudate Color: red, brown Periwound Skin Texture Texture Color No Abnormalities Noted: No No Abnormalities Noted: No Moisture No Abnormalities Noted: No Treatment Notes Wound #5 (Lower Leg) Wound Laterality: Right, Lateral, Distal Cleanser Soap and Water Discharge Instruction: May shower and wash wound with dial antibacterial soap and water prior to dressing change. Peri-Wound Care Triamcinolone 15 (g) Discharge Instruction: Use triamcinolone apply to entire both legs. around the wounds. Topical Triamcinolone Discharge Instruction: Apply Triamcinolone as directed Primary Dressing Endoform 2x2 in Discharge Instruction: Moisten with saline or KY Jelly Secondary Dressing Woven Gauze Sponge, Non-Sterile 4x4 in Discharge Instruction: Apply over primary dressing as directed. Secured With [employer], 4.5x3.1 (in/yd) Discharge Instruction: Secure with Kerlix as directed. 44M Medipore H Soft Cloth Surgical T ape, 4 x 10 (in/yd) Discharge Instruction: Secure with tape as directed. Compression Wrap Compression Stockings Add-Ons Electronic Signature(s) Signed: 06/18/2023 8:36:51 AM By: Dayton Scrape Signed: 06/18/2023 5:16:35 PM By: [name] RN, BSN Entered By: Dayton Scrape on 06/18/2023 05:05:17 [name] [name] ([id number])  [id number] Page 13 of 14 -------------------------------------------------------------------------------- Wound Assessment Details Patient Name: Date of Service: [name] [name]. 06/18/2023 8:00 A M Medical Record Number: [id number] Patient Account Number: [id number] Date of Birth/Sex: Treating RN: 1956/04/24 (67 y.o. [name], [name] Primary Care Lathyn Griggs: Cranford Mon Other Clinician: Referring Patryck Kilgore: Treating Jonathen Rathman/Extender: [name] in Treatment: 25 Wound Status Wound Number: 6 Primary Diabetic Wound/Ulcer of the Lower Extremity Etiology: Wound Location: Left, Proximal, Posterior Lower Leg Wound Status: Open Wounding Event: Blister Comorbid Anemia, Lymphedema, Hypertension, Cirrhosis , Type II Date Acquired: 02/19/2023 History: Diabetes Weeks Of Treatment: 17 Clustered Wound: No Photos Wound Measurements Length: (cm) Width: (cm) Depth: (cm) Area: (cm) Volume: (cm) 0 % Reduction in Area: 100% 0 % Reduction in Volume: 100% 0 Epithelialization: Large (67-100%) 0 Tunneling: No 0 Undermining: No Wound Description Classification: Grade 1 Wound Margin: Distinct, outline attached Exudate Amount: None Present Foul Odor After Cleansing: No Slough/Fibrino No Wound Bed Granulation Amount: None Present (0%) Exposed Structure Necrotic Amount: None Present (0%) Fascia Exposed: No Fat Layer (Subcutaneous Tissue) Exposed: No Tendon  Exposed: No Muscle Exposed: No Joint Exposed: No Bone Exposed: No Periwound Skin Texture Texture Color No Abnormalities Noted: No No Abnormalities Noted: No Callus: No Atrophie Blanche: No Crepitus: No Cyanosis: No Excoriation: No Ecchymosis: No Induration: No Erythema: No Rash: No Hemosiderin Staining: No Scarring: No Mottled: No Pallor: No Moisture Rubor: No No Abnormalities Noted: No Dry / Scaly: No Temperature / Pain Maceration: No Temperature: No Abnormality [name] [name], [name] [name] ([id number]) [id number] Page 14 of 14 Electronic Signature(s) Signed: 06/18/2023 8:36:51 AM By: Dayton Scrape Signed: 06/18/2023 5:16:35 PM By: [name] RN, BSN Entered By: Dayton Scrape on 06/18/2023 05:03:05 -------------------------------------------------------------------------------- Vitals Details Patient Name: Date of Service: [name] [name], [name] 06/18/2023 8:00 A M Medical Record Number: [id number] Patient Account Number: [id number] Date of Birth/Sex: Treating RN: May 11, 1956 (67 y.o. [name], [name] Primary Care Lacreshia Bondarenko: Cranford Mon Other Clinician: Referring Raunak Antuna: Treating Jasamine Pottinger/Extender: [name] in Treatment: 25 Vital Signs Time Taken: 07:57 Temperature (F): 98.1 Height (in): 72 Pulse (bpm): 65 Weight (lbs): 245 Respiratory Rate (breaths/min): 20 Body Mass Index (BMI): 33.2 Blood Pressure (mmHg): 139/77 Capillary Blood Glucose (mg/dl): 91 Reference Range: 80 - 120 mg / dl Electronic Signature(s) Signed: 06/18/2023 5:16:35 PM By: [name] RN, BSN Entered By: [name] on 06/18/2023 04:57:39

## 2023-06-19 NOTE — Progress Notes (Signed)
[name] [name], MELGAREJO ([id number]) [id number] Page 1 of 9 Visit Report for 06/18/2023 Debridement Details Patient Name: Date of Service: [name] RRIS M. 06/18/2023 8:00 A M Medical Record Number: [id number] Patient Account Number: [id number] Date of Birth/Sex: Treating RN: March 23, 1956 (67 y.o. M) Primary Care Provider: Cranford Mon Other Clinician: Referring Provider: Treating Provider/Extender: [name] in Treatment: 25 Debridement Performed for Assessment: Wound #4 Left,Distal,Medial Lower Leg Performed By: Physician Maxwell Caul., MD The following information was scribed by: [name] The information was scribed for: [name] Debridement Type: Debridement Severity of Tissue Pre Debridement: Fat layer exposed Level of Consciousness (Pre-procedure): Awake and Alert Pre-procedure Verification/Time Out Yes - 08:10 Taken: Start Time: 08:11 Pain Control: Lidocaine 4% T opical Solution Percent of Wound Bed Debrided: 100% T Area Debrided (cm): otal 0.2 Tissue and other material debrided: Viable, Non-Viable, Slough, Subcutaneous, Slough Level: Skin/Subcutaneous Tissue Debridement Description: Excisional Instrument: Curette Bleeding: Minimum Hemostasis Achieved: Pressure End Time: 08:13 Procedural Pain: 0 Post Procedural Pain: 0 Response to Treatment: Procedure was tolerated well Level of Consciousness (Post- Awake and Alert procedure): Post Debridement Measurements of Total Wound Length: (cm) 0.5 Width: (cm) 0.5 Depth: (cm) 0.2 Volume: (cm) 0.039 Character of Wound/Ulcer Post Debridement: Improved Severity of Tissue Post Debridement: Fat layer exposed Post Procedure Diagnosis Same as Pre-procedure Electronic Signature(s) Signed: 06/19/2023 10:44:47 AM By: [name] MD Entered By: [name] on 06/18/2023  05:20:50 -------------------------------------------------------------------------------- HPI Details Patient Name: Date of Service: [name], [name] 06/18/2023 8:00 A M Medical Record Number: [id number] Patient Account Number: [id number] Date of Birth/Sex: Treating RN: [birth year] (67 y.o. M) Primary Care Provider: Cranford Mon Other Clinician: Referring Provider: Treating Provider/Extender: [name] in Treatment: [address] [place] M ([id number]) [id number] Page 2 of 9 History of Present Illness HPI Description: 12/24/2022 [name] is a 67 year old male with a past medical history of controlled type 2 diabetes on insulin, lymphedema/chronic venous insufficiency and cirrhosis that presents to the clinic for a 1 year history of nonhealing wounds to his lower extremities bilaterally. He reports the starting spontaneously. He states that he has been following with [facility] and they have been using [facility] antibiotic spray to the legs. He is not wearing compression stockings or wraps. He is also tried Medihoney and collagen in the past with little benefit. He currently denies systemic signs of infection. 5/30; patient presents for follow-up. He has been using triamcinolone cream to the periwound and Santyl to the wound beds. There has been improvement in wound healing. He denies signs of infection and has no issues or complaints today. 6/4; both legs look considerably better. He has a wound on the right lateral lower leg and the left medial lower leg. Significant hemosiderin in the right leg less so on the left. We have been using Hydrofera Blue under compression 6/13; patient presents for follow-up. We have been using antibiotic ointment with Hydrofera Blue under compression therapy. The wounds are smaller. He has no issues or complaints today. We discussed ordering juxta lite compression garment wraps T use once his  wounds heal as he has hard time putting on o compression stockings. He was agreeable with this. 6/20; patient presents for follow-up. We have been using antibiotic ointment with Hydrofera Blue under compression therapy to the lower extremities bilaterally. Wounds are smaller. He has been approved for PuraPly and was agreeable with placement today. He denies signs of infection. 6/27; patient  presents for follow-up. We have been using antibiotic ointment with Hydrofera Blue under compression therapy to the left lower extremity. This wound is healed. He has his juxta lite compression wraps with him today. T the right lateral leg we have been using PuraPly under compression and that wound o is smaller today. Unfortunately he has developed skin breakdown from the Steri-Strip that was used to hold the PuraPly in place and this has created a new wound on the right leg. 7/8; patient presents for follow-up. We have been using PuraPly to the right lateral leg wound Under compression wrap. Wound is slightly smaller however original wound used for PuraPly is healed. He unfortunately developed a new wound to the left lower extremity but it looks like this was from potential scratching. He has been using his juxta lite compression here. 716; patient presents for follow-up. We have been using antibiotic ointment with Hydrofera Blue under 3 layer compression to lower extremities bilaterally. Unfortunately he has continued to develop wounds to the left lower extremity. He currently denies signs of infection. 7/22; patient presents for follow-up. He has been using Santyl and Hydrofera Blue with triamcinolone cream under her juxta lite compression wraps daily. No new wounds today. Current wounds are stable. Less irritation to the periwound. 7/29; patient presents for follow-up. He has been using Santyl and Hydrofera Blue with triamcinolone cream under the juxta lite compression wraps. He is having a hard time keeping  the [facility] in place to the wound beds. He denies signs of infection. 8/5; patient presents for follow-up. Has been using Santyl to the wound beds and triamcinolone cream under the juxta lite compression. He has no issues or complaints today. Wounds are overall stable. 8/12; patient presents for follow-up. He has been using Santyl to the wound beds and triamcinolone cream to the periwound under JuxtaLite compression. He is scheduled to see dermatology this week. Wounds are overall stable. 8/19; patient presents for follow-up. He has been using Santyl and PolyMem to the wound beds along with triamcinolone cream to the periwound under his juxta light compression. He saw dermatology and they obtained a biopsy of the distal medial wound on the left leg. He also had venous reflux studies last week that showed no venous reflux on the left and only venous reflux to the CFV with no venous reflux throughout the rest of the venous system. 9/3;Patient presents for follow-up. Patient has been using PolyMem silver with Santyl. Patient was seen by dermatology and they did a punch biopsy. 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 Patient was prescribed silver Silvadene. He has not started this yet. Overall wounds appear well-healing. 9/9; patient presents for follow-up. We have been using PolyMem silver with Santyl. Wounds are smaller. He has been using his compression Velcro wrap daily. He declines debridement today. 9/23; this is a patient who clearly has chronic venous insufficiency. For about the last 18 months he has developed recurrent small nodules on his bilateral lower legs these are painful initially seemed to have a white substance on the top they then break open and eventually will heal over. These occurred even while he was under compression. 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 The patient has been  using Santyl and an polymen Ag. He has been using his own compression stockings. 9/30; everything is just about healed on the left leg. Still has 2 small superficial areas on the  left. On the right 2 areas with depth I wonder whether these were necrobiosis lipoidica lesions that the dermatologist diagnosed by biopsying one of the areas on the left. I reviewed the note after they left the clinic last week. 10/7; still with the 2 spots which are small wounds on the right anterior and right lateral lower leg. These have very adherent slough. He also has a small painful area on the left posterior Achilles and the left posterior lower leg. The biopsy apparently showed necrobiosis lipoidica. I think this is the cause of the recurrent wounds over the last 18 months. 10/14; the areas on the left are smaller. This includes the biopsy site on the left Achilles area. The areas on the right are about the same this is the right lateral and right anterior lower leg. I debrided both of these last week he did not want to be debrided today. As it turns out he has 1 pound jar of triamcinolone 0.1% I not sure what that was being used for. Some changes of chronic stasis dermatitis perhaps when he first came in according to our intake nurse. 10/22; again no change in most of these areas. He does not want debridements. We have been using triamcinolone Santyl and Hydrofera Blue but very little change. 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 We have 2 areas on the right and 2 areas on the left 1 of which was the biopsy site 10/29; some improvement. The superior wounds on both sides have dried up eschar on the surface he has not been allowing debridement but these look better. The deeper punched-out area on the right also appears to have a better surface. The biopsy site again eschared over but looks as though it is contracting. I changed to Iodosorb last week with TCA. Multiple prior wounds  likely necrobiosis although he does have venous insufficiency 11/12; 2-week follow-up. The patient has 2 open wounds on the right 1 on the lower anterior and 1 on the lower medial leg. He has a small open area on the left medial lower leg. I have looked through his previous records. He has biopsy-proven necrobiosis lipoidica and atrophie blanche. The wounds are small and punched-out look more like the remanence of necrobiosis then venous wounds. Nevertheless his wife shows me pictures of his legs where he currently has chronic venous insufficiency with stasis dermatitis. We have been using Iodosorb on the wounds TCA liberally to both wounds in the lower legs Electronic Signature(s) Signed: 06/19/2023 10:44:47 AM By: [name] MD Cupps, Signed: 06/19/2023 10:44:47 AM By: [name] MD Milus Height ([id number]) [id number] Page 3 of 9 Entered By: [name] on 06/18/2023 05:23:52 -------------------------------------------------------------------------------- Physical Exam Details Patient Name: Date of Service: Loma Newton RRIS M. 06/18/2023 8:00 A M Medical Record Number: [id number] Patient Account Number: [id number] Date of Birth/Sex: Treating RN: June 03, 1956 (67 y.o. M) Primary Care Provider: Cranford Mon Other Clinician: Referring Provider: Treating Provider/Extender: [name] in Treatment: 25 Constitutional Sitting or standing Blood Pressure is within target range for patient.. Pulse regular and within target range for patient.Marland Kitchen Respirations regular, non-labored and within target range.. Temperature is normal and within the target range for the patient.Marland Kitchen Appears in no distress. Notes Wound exam; the patient has hemosiderin staining but no obvious inflammation/stasis dermatitis. He has 2 small but punched-out wounds 1 on the right anterior lower leg 1 on the right medial lower leg. Both of these have depth but reasonably  clean granulated surfaces. On the left medial lower leg 100% slough. I used a #3 curette to debride this small wound removed living slough and some subcutaneous tissue to get down to healthy granulation. Minimal bleeding controlled with direct pressure Electronic Signature(s) Signed: 06/19/2023 10:44:47 AM By: [name] MD Entered By: [name] on 06/18/2023 05:25:39 -------------------------------------------------------------------------------- Physician Orders Details Patient Name: Date of Service: [name], [name] 06/18/2023 8:00 A M Medical Record Number: [id number] Patient Account Number: [id number] Date of Birth/Sex: Treating RN: 09/12/1955 (67 y.o. [name], [name] Primary Care Provider: Cranford Mon Other Clinician: Referring Provider: Treating Provider/Extender: [name] in Treatment: 25 The following information was scribed by: [name] The information was scribed for: [name] Verbal / Phone Orders: No Diagnosis Coding ICD-10 Coding Code Description I87.313 Chronic venous hypertension (idiopathic) with ulcer of bilateral lower extremity L97.818 Non-pressure chronic ulcer of other part of right lower leg with other specified severity L97.828 Non-pressure chronic ulcer of other part of left lower leg with other specified severity I89.0 Lymphedema, not elsewhere classified E11.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 other skin ulcer Follow-up Appointments ppointment in 1 week. - Dr. Leanord Hawking 06/25/2023 0800 (already scheduled) Return A Return appointment in 3 weeks. - Skip the week of Thanksgiving. ***Dr. Leanord Hawking (front office to schedule)*** Anesthetic (In clinic) Topical Lidocaine 4% applied to wound bed Bathing/ Shower/ Hygiene May shower and wash wound with soap and water. SIMON, OHLINGER ([id number]) [id number] Page 4 of 9 Wound Treatment Wound #3 - Lower Leg Wound Laterality: Right, Anterior,  Proximal Cleanser: Soap and Water Every Other Day/30 Days Discharge Instructions: May shower and wash wound with dial antibacterial soap and water prior to dressing change. Peri-Wound Care: Triamcinolone 15 (g) Every Other Day/30 Days Discharge Instructions: Use triamcinolone apply to entire both legs. around the wounds. Topical: Triamcinolone Every Other Day/30 Days Discharge Instructions: Apply Triamcinolone as directed Prim Dressing: Endoform 2x2 in Every Other Day/30 Days ary Discharge Instructions: Moisten with saline or [place] Jelly Secondary Dressing: Woven Gauze Sponge, Non-Sterile 4x4 in (Generic) Every Other Day/30 Days Discharge Instructions: Apply over primary dressing as directed. Secured With: [employer], 4.5x3.1 (in/yd) (Generic) Every Other Day/30 Days Discharge Instructions: Secure with Kerlix as directed. Secured With: 62M Medipore H Soft Cloth Surgical T ape, 4 x 10 (in/yd) (Generic) Every Other Day/30 Days Discharge Instructions: Secure with tape as directed. Wound #4 - Lower Leg Wound Laterality: Left, Medial, Distal Cleanser: Soap and Water Every Other Day/30 Days Discharge Instructions: May shower and wash wound with dial antibacterial soap and water prior to dressing change. Peri-Wound Care: Triamcinolone 15 (g) Every Other Day/30 Days Discharge Instructions: Use triamcinolone apply to entire both legs. around the wounds. Topical: Triamcinolone Every Other Day/30 Days Discharge Instructions: Apply Triamcinolone as directed Prim Dressing: Endoform 2x2 in Every Other Day/30 Days ary Discharge Instructions: Moisten with saline or [place] Jelly Secondary Dressing: Woven Gauze Sponge, Non-Sterile 4x4 in (Generic) Every Other Day/30 Days Discharge Instructions: Apply over primary dressing as directed. Secured With: [employer], 4.5x3.1 (in/yd) (Generic) Every Other Day/30 Days Discharge Instructions: Secure with Kerlix as directed. Secured With: 62M Medipore H  Soft Cloth Surgical T ape, 4 x 10 (in/yd) (Generic) Every Other Day/30 Days Discharge Instructions: Secure with tape as directed. Wound #5 - Lower Leg Wound Laterality: Right, Lateral, Distal Cleanser: Soap and Water Every Other Day/30 Days Discharge Instructions: May shower and wash wound with dial antibacterial soap and water prior to  dressing change. Peri-Wound Care: Triamcinolone 15 (g) Every Other Day/30 Days Discharge Instructions: Use triamcinolone apply to entire both legs. around the wounds. Topical: Triamcinolone Every Other Day/30 Days Discharge Instructions: Apply Triamcinolone as directed Prim Dressing: Endoform 2x2 in Every Other Day/30 Days ary Discharge Instructions: Moisten with saline or [place] Jelly Secondary Dressing: Woven Gauze Sponge, Non-Sterile 4x4 in (Generic) Every Other Day/30 Days Discharge Instructions: Apply over primary dressing as directed. Secured With: [employer], 4.5x3.1 (in/yd) (Generic) Every Other Day/30 Days Discharge Instructions: Secure with Kerlix as directed. Secured With: 47M Medipore H Soft Cloth Surgical T ape, 4 x 10 (in/yd) (Generic) Every Other Day/30 Days Discharge Instructions: Secure with tape as directed. Electronic Signature(s) Signed: 06/18/2023 5:16:35 PM By: [name] RN, BSN Signed: 06/19/2023 10:44:47 AM By: [name] MD Entered By: [name] on 06/18/2023 05:17:45 Cay Schillings ([id number]) [id number] Page 5 of 9 -------------------------------------------------------------------------------- Problem List Details Patient Name: Date of Service: [name] 06/18/2023 8:00 A M Medical Record Number: [id number] Patient Account Number: [id number] Date of Birth/Sex: Treating RN: [birth year] (68 y.o. [name], [name] Primary Care Provider: Cranford Mon Other Clinician: Referring Provider: Treating Provider/Extender: [name] in Treatment: 25 Active  Problems ICD-10 Encounter Code Description Active Date MDM Diagnosis I87.313 Chronic venous hypertension (idiopathic) with ulcer of bilateral lower extremity 12/24/2022 No Yes L97.818 Non-pressure chronic ulcer of other part of right lower leg with other specified 12/24/2022 No Yes severity L97.828 Non-pressure chronic ulcer of other part of left lower leg with other specified 12/24/2022 No Yes severity I89.0 Lymphedema, not elsewhere classified 12/24/2022 No Yes E11.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 other skin ulcer 12/24/2022 No Yes Inactive Problems ICD-10 Code Description Active Date Inactive Date 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 06/18/2023 8:00 A M Medical Record Number: [id number] Patient Account Number: [id number] Date of Birth/Sex: Treating RN: Oct 22, 1955 (67 y.o. M) Primary Care Provider: Cranford Mon Other Clinician: LINDBURG, BANNER ([id number]) [id number] Page 6 of 9 Referring Provider: Treating Provider/Extender: [name] in Treatment: 25 Subjective History of Present Illness (HPI) 12/24/2022 Mr. Nycholas Peot is a 67 year old male with a past medical history of controlled type 2 diabetes on insulin, lymphedema/chronic venous insufficiency and cirrhosis that presents to the clinic for a 1 year history of nonhealing wounds to his lower extremities bilaterally. He reports the starting spontaneously. He states that he has been following with [facility] and they have been using [facility] antibiotic spray to the legs. He is not wearing  compression stockings or wraps. He is also tried Medihoney and collagen in the past with little benefit. He currently denies systemic signs of infection. 5/30; patient presents for follow-up. He has been using triamcinolone cream to the periwound and Santyl to the wound beds. There has been improvement in wound healing. He denies signs of infection and has no issues or complaints today. 6/4; both legs look considerably better. He has a wound on the right lateral lower leg and the left medial lower leg. Significant hemosiderin in the right leg less so on the left. We have been using Hydrofera Blue under compression 6/13; patient presents for follow-up. We have been using antibiotic ointment with Hydrofera Blue under compression  therapy. The wounds are smaller. He has no issues or complaints today. We discussed ordering juxta lite compression garment wraps T use once his wounds heal as he has hard time putting on o compression stockings. He was agreeable with this. 6/20; patient presents for follow-up. We have been using antibiotic ointment with Hydrofera Blue under compression therapy to the lower extremities bilaterally. Wounds are smaller. He has been approved for PuraPly and was agreeable with placement today. He denies signs of infection. 6/27; patient presents for follow-up. We have been using antibiotic ointment with Hydrofera Blue under compression therapy to the left lower extremity. This wound is healed. He has his juxta lite compression wraps with him today. T the right lateral leg we have been using PuraPly under compression and that wound o is smaller today. Unfortunately he has developed skin breakdown from the Steri-Strip that was used to hold the PuraPly in place and this has created a new wound on the right leg. 7/8; patient presents for follow-up. We have been using PuraPly to the right lateral leg wound Under compression wrap. Wound is slightly smaller however original wound used  for PuraPly is healed. He unfortunately developed a new wound to the left lower extremity but it looks like this was from potential scratching. He has been using his juxta lite compression here. 716; patient presents for follow-up. We have been using antibiotic ointment with Hydrofera Blue under 3 layer compression to lower extremities bilaterally. Unfortunately he has continued to develop wounds to the left lower extremity. He currently denies signs of infection. 7/22; patient presents for follow-up. He has been using Santyl and Hydrofera Blue with triamcinolone cream under her juxta lite compression wraps daily. No new wounds today. Current wounds are stable. Less irritation to the periwound. 7/29; patient presents for follow-up. He has been using Santyl and Hydrofera Blue with triamcinolone cream under the juxta lite compression wraps. He is having a hard time keeping the [facility] in place to the wound beds. He denies signs of infection. 8/5; patient presents for follow-up. Has been using Santyl to the wound beds and triamcinolone cream under the juxta lite compression. He has no issues or complaints today. Wounds are overall stable. 8/12; patient presents for follow-up. He has been using Santyl to the wound beds and triamcinolone cream to the periwound under JuxtaLite compression. He is scheduled to see dermatology this week. Wounds are overall stable. 8/19; patient presents for follow-up. He has been using Santyl and PolyMem to the wound beds along with triamcinolone cream to the periwound under his juxta light compression. He saw dermatology and they obtained a biopsy of the distal medial wound on the left leg. He also had venous reflux studies last week that showed no venous reflux on the left and only venous reflux to the CFV with no venous reflux throughout the rest of the venous system. 9/3;Patient presents for follow-up. Patient has been using PolyMem silver with Santyl. Patient was  seen by dermatology and they did a punch biopsy. 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 Patient was prescribed silver Silvadene. He has not started this yet. Overall wounds appear well-healing. 9/9; patient presents for follow-up. We have been using PolyMem silver with Santyl. Wounds are smaller. He has been using his compression Velcro wrap daily. He declines debridement today. 9/23; this is a patient who clearly has chronic venous insufficiency. For about the last 18 months he has developed recurrent small nodules on his bilateral lower legs these are painful initially seemed  to have a white substance on the top they then break open and eventually will heal over. These occurred even while he was under compression. 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 The patient has been using Santyl and an polymen Ag. He has been using his own compression stockings. 9/30; everything is just about healed on the left leg. Still has 2 small superficial areas on the left. On the right 2 areas with depth I wonder whether these were necrobiosis lipoidica lesions that the dermatologist diagnosed by biopsying one of the areas on the left. I reviewed the note after they left the clinic last week. 10/7; still with the 2 spots which are small wounds on the right anterior and right lateral lower leg. These have very adherent slough. He also has a small painful area on the left posterior Achilles and the left posterior lower leg. The biopsy apparently showed necrobiosis lipoidica. I think this is the cause of the recurrent wounds over the last 18 months. 10/14; the areas on the left are smaller. This includes the biopsy site on the left Achilles area. The areas on the right are about the same this is the right lateral and right anterior lower leg. I debrided both of these last week he did not want to be debrided today. As it  turns out he has 1 pound jar of triamcinolone 0.1% I not sure what that was being used for. Some changes of chronic stasis dermatitis perhaps when he first came in according to our intake nurse. 10/22; again no change in most of these areas. He does not want debridements. We have been using triamcinolone Santyl and Hydrofera Blue but very little change. 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 We have 2 areas on the right and 2 areas on the left 1 of which was the biopsy site 10/29; some improvement. The superior wounds on both sides have dried up eschar on the surface he has not been allowing debridement but these look better. The deeper punched-out area on the right also appears to have a better surface. The biopsy site again eschared over but looks as though it is contracting. I changed to Iodosorb last week with TCA. Multiple prior wounds likely necrobiosis although he does have venous insufficiency 11/12; 2-week follow-up. The patient has 2 open wounds on the right 1 on the lower anterior and 1 on the lower medial leg. He has a small open area on the left medial lower leg. I have looked through his previous records. He has biopsy-proven necrobiosis lipoidica and atrophie blanche. The wounds are small and punched-out look more like the remanence of necrobiosis then venous wounds. Nevertheless his wife shows me pictures of his legs where he currently has chronic venous insufficiency with stasis dermatitis. We have been using Iodosorb on the wounds TCA liberally to both wounds in the lower legs ELLIOTT, GEMBERLING ([id number]) [id number] Page 7 of 9 Objective Constitutional Sitting or standing Blood Pressure is within target range for patient.. Pulse regular and within target range for patient.Marland Kitchen Respirations regular, non-labored and within target range.. Temperature is normal and within the target range for the patient.Marland Kitchen Appears in no  distress. Vitals Time Taken: 7:57 AM, Height: 72 in, Weight: 245 lbs, BMI: 33.2, Temperature: 98.1 F, Pulse: 65 bpm, Respiratory Rate: 20 breaths/min, Blood Pressure: 139/77 mmHg, Capillary Blood Glucose: 91 mg/dl. General Notes: Wound exam;  the patient has hemosiderin staining but no obvious inflammation/stasis dermatitis. He has 2 small but punched-out wounds 1 on the right anterior lower leg 1 on the right medial lower leg. Both of these have depth but reasonably clean granulated surfaces. On the left medial lower leg 100% slough. I used a #3 curette to debride this small wound removed living slough and some subcutaneous tissue to get down to healthy granulation. Minimal bleeding controlled with direct pressure Integumentary (Hair, Skin) Wound #3 status is Open. Original cause of wound was Gradually Appeared. The date acquired was: 01/25/2023. The wound has been in treatment 19 weeks. The wound is located on the Right,Proximal,Anterior Lower Leg. The wound measures 1.1cm length x 1cm width x 0.3cm depth; 0.864cm^2 area and 0.259cm^3 volume. There is Fat Layer (Subcutaneous Tissue) exposed. There is no tunneling or undermining noted. There is a medium amount of serosanguineous drainage noted. The wound margin is distinct with the outline attached to the wound base. There is large (67-100%) red, pink granulation within the wound bed. There is a small (1-33%) amount of necrotic tissue within the wound bed including Adherent Slough. The periwound skin appearance did not exhibit: Callus, Crepitus, Excoriation, Induration, Rash, Scarring, Dry/Scaly, Maceration, Atrophie Blanche, Cyanosis, Ecchymosis, Hemosiderin Staining, Mottled, Pallor, Rubor, Erythema. Periwound temperature was noted as No Abnormality. Wound #4 status is Open. Original cause of wound was Gradually Appeared. The date acquired was: 02/11/2023. The wound has been in treatment 18 weeks. The wound is located on the Left,Distal,Medial Lower  Leg. The wound measures 0.5cm length x 0.5cm width x 0.1cm depth; 0.196cm^2 area and 0.02cm^3 volume. There is Fat Layer (Subcutaneous Tissue) exposed. There is no tunneling or undermining noted. There is a medium amount of serosanguineous drainage noted. The wound margin is distinct with the outline attached to the wound base. There is no granulation within the wound bed. There is a large (67-100%) amount of necrotic tissue within the wound bed including Adherent Slough. The periwound skin appearance did not exhibit: Callus, Crepitus, Excoriation, Induration, Rash, Scarring, Dry/Scaly, Maceration, Atrophie Blanche, Cyanosis, Ecchymosis, Hemosiderin Staining, Mottled, Pallor, Rubor, Erythema. Periwound temperature was noted as No Abnormality. Wound #5 status is Open. Original cause of wound was Blister. The date acquired was: 02/19/2023. The wound has been in treatment 17 weeks. The wound is located on the Right,Distal,Lateral Lower Leg. The wound measures 1.3cm length x 0.6cm width x 0.2cm depth; 0.613cm^2 area and 0.123cm^3 volume. There is a medium amount of serosanguineous drainage noted. Wound #6 status is Open. Original cause of wound was Blister. The date acquired was: 02/19/2023. The wound has been in treatment 17 weeks. The wound is located on the Left,Proximal,Posterior Lower Leg. The wound measures 0cm length x 0cm width x 0cm depth; 0cm^2 area and 0cm^3 volume. There is no tunneling or undermining noted. There is a none present amount of drainage noted. The wound margin is distinct with the outline attached to the wound base. There is no granulation within the wound bed. There is no necrotic tissue within the wound bed. The periwound skin appearance did not exhibit: Callus, Crepitus, Excoriation, Induration, Rash, Scarring, Dry/Scaly, Maceration, Atrophie Blanche, Cyanosis, Ecchymosis, Hemosiderin Staining, Mottled, Pallor, Rubor, Erythema. Periwound temperature was noted as No  Abnormality. 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 There was a Excisional Skin/Subcutaneous Tissue Debridement with a total area of 0.2 sq cm performed by Maxwell Caul., MD. With the following instrument(s): Curette to remove Viable and Non-Viable tissue/material. Material removed includes Subcutaneous Tissue and Slough and after achieving pain control using Lidocaine 4% T opical Solution. A time out was conducted at 08:10, prior to the start of the procedure. A Minimum amount of bleeding was controlled with Pressure. The procedure was tolerated well with a pain level of 0 throughout and a pain level of 0 following the procedure. Post Debridement Measurements: 0.5cm length x 0.5cm width x 0.2cm depth; 0.039cm^3 volume. Character of Wound/Ulcer Post Debridement is improved. Severity of Tissue Post Debridement is: Fat layer exposed. Post procedure Diagnosis Wound #4: Same as Pre-Procedure CORRION, SMISEK ([id number]) [id number] Page 8 of 9 Plan Follow-up Appointments: Return Appointment in 1 week. - Dr. Leanord Hawking 06/25/2023 0800 (already scheduled) Return appointment in 3 weeks. - Skip the week of Thanksgiving. ***Dr. Leanord Hawking (front office to schedule)*** Anesthetic: (In clinic) Topical Lidocaine 4% applied to wound bed Bathing/ Shower/ Hygiene: May shower and wash wound with soap and water. WOUND #3: - Lower Leg Wound  Laterality: Right, Anterior, Proximal Cleanser: Soap and Water Every Other Day/30 Days Discharge Instructions: May shower and wash wound with dial antibacterial soap and water prior to dressing change. Peri-Wound Care: Triamcinolone 15 (g) Every Other Day/30 Days Discharge Instructions: Use triamcinolone apply to entire both legs. around the wounds. Topical: Triamcinolone Every Other Day/30 Days Discharge Instructions: Apply Triamcinolone as directed Prim Dressing: Endoform 2x2 in Every Other Day/30 Days ary Discharge Instructions: Moisten with saline or [place] Jelly Secondary Dressing: Woven Gauze Sponge, Non-Sterile 4x4 in (Generic) Every Other Day/30 Days Discharge Instructions: Apply over primary dressing as directed. Secured With: [employer], 4.5x3.1 (in/yd) (Generic) Every Other Day/30 Days Discharge Instructions: Secure with Kerlix as directed. Secured With: 38M Medipore H Soft Cloth Surgical T ape, 4 x 10 (in/yd) (Generic) Every Other Day/30 Days Discharge Instructions: Secure with tape as directed. WOUND #4: - Lower Leg Wound Laterality: Left, Medial, Distal Cleanser: Soap and Water Every Other Day/30 Days Discharge Instructions: May shower and wash wound with dial antibacterial soap and water prior to dressing change. Peri-Wound Care: Triamcinolone 15 (g) Every Other Day/30 Days Discharge Instructions: Use triamcinolone apply to entire both legs. around the wounds. Topical: Triamcinolone Every Other Day/30 Days Discharge Instructions: Apply Triamcinolone as directed Prim Dressing: Endoform 2x2 in Every Other Day/30 Days ary Discharge Instructions: Moisten with saline or [place] Jelly Secondary Dressing: Woven Gauze Sponge, Non-Sterile 4x4 in (Generic) Every Other Day/30 Days Discharge Instructions: Apply over primary dressing as directed. Secured With: [employer], 4.5x3.1 (in/yd) (Generic) Every Other Day/30 Days Discharge Instructions: Secure with Kerlix as  directed. Secured With: 38M Medipore H Soft Cloth Surgical T ape, 4 x 10 (in/yd) (Generic) Every Other Day/30 Days Discharge Instructions: Secure with tape as directed. WOUND #5: - Lower Leg Wound Laterality: Right, Lateral, Distal Cleanser: Soap and Water Every Other Day/30 Days Discharge Instructions: May shower and wash wound with dial antibacterial soap and water prior to dressing change. Peri-Wound Care: Triamcinolone 15 (g) Every Other Day/30 Days Discharge Instructions: Use triamcinolone apply to entire both legs. around the wounds. Topical: Triamcinolone Every Other  Day/30 Days Discharge Instructions: Apply Triamcinolone as directed Prim Dressing: Endoform 2x2 in Every Other Day/30 Days ary Discharge Instructions: Moisten with saline or [place] Jelly Secondary Dressing: Woven Gauze Sponge, Non-Sterile 4x4 in (Generic) Every Other Day/30 Days Discharge Instructions: Apply over primary dressing as directed. Secured With: [employer], 4.5x3.1 (in/yd) (Generic) Every Other Day/30 Days Discharge Instructions: Secure with Kerlix as directed. Secured With: 4M Medipore H Soft Cloth Surgical T ape, 4 x 10 (in/yd) (Generic) Every Other Day/30 Days Discharge Instructions: Secure with tape as directed. 1. I change the primary dressing to endoform hydrogel Liberal TCA to continue. 2. His edema control using his own stockings is really quite good. No obvious reason to give him compression wraps. 3. I have reviewed his biopsy/dermatology follow-up suggesting he had both necrobiosis and atrophie blanche/chronic venous insufficiency. Looking at his wife's pictures the skin on his legs is a lot better 4. We tried puraply earlier in the year I wonder about an amniotic based skin sub- Electronic Signature(s) Signed: 06/19/2023 10:44:47 AM By: [name] MD Entered By: [name] on 06/18/2023  72:53:66 -------------------------------------------------------------------------------- SuperBill Details Patient Name: Date of Service: [name], [name] 06/18/2023 Medical Record Number: [id number] Patient Account Number: [id number] Date of Birth/Sex: Treating RN: 1956/06/29 (67 y.o. [name] Primary Care Provider: Cranford Mon Other Clinician: Referring Provider: Treating Provider/Extender: [name] in Treatment: [address], [place] Judie Petit ([id number]) [id number] Page 9 of 9 Diagnosis Coding ICD-10 Codes Code Description I87.313 Chronic venous hypertension (idiopathic) with ulcer of bilateral lower extremity L97.818 Non-pressure chronic ulcer of other part of right lower leg with other specified severity L97.828 Non-pressure chronic ulcer of other part of left lower leg with other specified severity I89.0 Lymphedema, not elsewhere classified E11.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 other skin ulcer Facility Procedures : CPT4 Code: [id number] Description: 11042 - DEB SUBQ TISSUE 20 SQ CM/< ICD-10 Diagnosis Description L97.828 Non-pressure chronic ulcer of other part of left lower leg with other specified I87.313 Chronic venous hypertension (idiopathic) with ulcer of bilateral lower extremity Modifier: severity Quantity: 1 Physician Procedures : CPT4 Code Description Modifier [id number] 11042 - WC PHYS SUBQ TISS 20 SQ CM ICD-10 Diagnosis Description L97.828 Non-pressure chronic ulcer of other part of left lower leg with other specified severity I87.313 Chronic venous hypertension (idiopathic)  with ulcer of bilateral lower extremity Quantity: 1 Electronic Signature(s) Signed: 06/19/2023 10:44:47 AM By: [name] MD Entered By: [name] on 06/18/2023 05:27:33

## 2023-06-25 ENCOUNTER — Encounter (HOSPITAL_BASED_OUTPATIENT_CLINIC_OR_DEPARTMENT_OTHER): Payer: Medicare Other | Admitting: Internal Medicine

## 2023-06-25 DIAGNOSIS — E11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 other skin ulcer: Secondary | ICD-10-CM | POA: Diagnosis not present

## 2023-06-25 NOTE — Progress Notes (Addendum)
[name] [name], [name] [name] ([id number]) [id number] Page 1 of 14 Visit Report for 06/25/2023 Arrival Information Details Patient Name: Date of Service: [name] 06/25/2023 8:00 A M Medical Record Number: [id number] Patient Account Number: [id number] Date of Birth/Sex: Treating RN: [birth year] (67 y.o. [name] [name], [name] [name] Primary Care Khyrie Masi: [place] Mon Other Clinician: Referring Bailen Geffre: Treating Jamarius Saha/Extender: [name] in Treatment: 26 Visit Information History Since Last Visit Added or deleted any medications: No Patient Arrived: Ambulatory Any new allergies or adverse reactions: No Arrival Time: 07:57 Had a fall or experienced change in No Accompanied By: sister activities of daily living that may affect Transfer Assistance: None risk of falls: Patient Identification Verified: Yes Signs or symptoms of abuse/neglect since last visito No Secondary Verification Process Completed: Yes Hospitalized since last visit: No Patient Requires Transmission-Based No Implantable device outside of the clinic excluding No Precautions: cellular tissue based products placed in the center Patient Has Alerts: Yes since last visit: Patient Alerts: 10/23 ABI L0.94 R1 VVS Has Dressing in Place as Prescribed: Yes 10/23 TBI L1.2 R0.88 VVS Has Compression in Place as Prescribed: Yes Pain Present Now: No Electronic Signature(s) Signed: 06/25/2023 4:47:01 PM By: [name] RN, BSN Entered By: [name] on 06/25/2023 07:57:42 -------------------------------------------------------------------------------- Clinic Level of Care Assessment Details Patient Name: Date of Service: CA 82, [name] 06/25/2023 8:00 A M Medical Record Number: [id number] Patient Account Number: [id number] Date of Birth/Sex: Treating RN: [birth year] (67 y.o. [name] [name] Primary Care Aws Shere: [place] Mon Other Clinician: Referring Karely Hurtado: Treating  Maralee Higuchi/Extender: [name] in Treatment: 26 Clinic Level of Care Assessment Items TOOL 4 Quantity Score X- 1 0 Use when only an EandM is performed on FOLLOW-UP visit ASSESSMENTS - Nursing Assessment / Reassessment X- 1 10 Reassessment of Co-morbidities (includes updates in patient status) X- 1 5 Reassessment of Adherence to Treatment Plan ASSESSMENTS - Wound and Skin A ssessment / Reassessment []  - 0 Simple Wound Assessment / Reassessment - one wound X- 3 5 Complex Wound Assessment / Reassessment - multiple wounds X- 1 10 Dermatologic / Skin Assessment (not related to wound area) ASSESSMENTS - Focused Assessment X- 2 5 Circumferential Edema Measurements - multi extremities []  - 0 Nutritional Assessment / Counseling / Intervention JETLI, LAUBSCHER ([id number]) [id number] Page 2 of 14 []  - 0 Lower Extremity Assessment (monofilament, tuning fork, pulses) []  - 0 Peripheral Arterial Disease Assessment (using hand held doppler) ASSESSMENTS - Ostomy and/or Continence Assessment and Care []  - 0 Incontinence Assessment and Management []  - 0 Ostomy Care Assessment and Management (repouching, etc.) PROCESS - Coordination of Care []  - 0 Simple Patient / Family Education for ongoing care X- 1 20 Complex (extensive) Patient / Family Education for ongoing care X- 1 10 Staff obtains Chiropractor, Records, T Results / Process Orders est []  - 0 Staff telephones HHA, Nursing Homes / Clarify orders / etc []  - 0 Routine Transfer to another Facility (non-emergent condition) []  - 0 Routine Hospital Admission (non-emergent condition) []  - 0 New Admissions / Manufacturing engineer / Ordering NPWT Apligraf, etc. , []  - 0 Emergency Hospital Admission (emergent condition) []  - 0 Simple Discharge Coordination X- 1 15 Complex (extensive) Discharge Coordination PROCESS - Special Needs []  - 0 Pediatric / Minor Patient Management []  -  0 Isolation Patient Management []  - 0 Hearing / Language / Visual special needs []  - 0 Assessment of Community assistance (transportation, D/C planning, etc.) []  - 0 Additional  assistance / Altered mentation []  - 0 Support Surface(s) Assessment (bed, cushion, seat, etc.) INTERVENTIONS - Wound Cleansing / Measurement []  - 0 Simple Wound Cleansing - one wound X- 3 5 Complex Wound Cleansing - multiple wounds X- 1 5 Wound Imaging (photographs - any number of wounds) []  - 0 Wound Tracing (instead of photographs) []  - 0 Simple Wound Measurement - one wound X- 3 5 Complex Wound Measurement - multiple wounds INTERVENTIONS - Wound Dressings X - Small Wound Dressing one or multiple wounds 2 10 []  - 0 Medium Wound Dressing one or multiple wounds []  - 0 Large Wound Dressing one or multiple wounds X- 1 5 Application of Medications - topical []  - 0 Application of Medications - injection INTERVENTIONS - Miscellaneous []  - 0 External ear exam []  - 0 Specimen Collection (cultures, biopsies, blood, body fluids, etc.) []  - 0 Specimen(s) / Culture(s) sent or taken to Lab for analysis []  - 0 Patient Transfer (multiple staff / Nurse, adult / Similar devices) []  - 0 Simple Staple / Suture removal (25 or less) []  - 0 Complex Staple / Suture removal (26 or more) []  - 0 Hypo / Hyperglycemic Management (close monitor of Blood Glucose) BRYSTON, ROBARE ([id number]) [id number] Page 3 of 14 []  - 0 Ankle / Brachial Index (ABI) - do not check if billed separately X- 1 5 Vital Signs Has the patient been seen at the hospital within the last three years: Yes Total Score: 160 Level Of Care: New/Established - Level 5 Electronic Signature(s) Signed: 06/25/2023 4:47:01 PM By: [name] RN, BSN Entered By: [name] on 06/25/2023 08:16:25 -------------------------------------------------------------------------------- Complex / Palliative Patient Assessment  Details Patient Name: Date of Service: CA 43, [name] 06/25/2023 8:00 A M Medical Record Number: [id number] Patient Account Number: [id number] Date of Birth/Sex: Treating RN: Nov 28, 1955 (67 y.o. [name] [name] Primary Care Dorotha Hirschi: [place] Mon Other Clinician: Referring Jandi Swiger: Treating Jaelynne Hockley/Extender: [name] in Treatment: 26 Complex Wound Management Criteria Patient has remarkable or complex co-morbidities requiring medications or treatments that extend wound healing times. Examples: Diabetes mellitus with chronic renal failure or end stage renal disease requiring dialysis Advanced or poorly controlled rheumatoid arthritis Diabetes mellitus and end stage chronic obstructive pulmonary disease Active cancer with current chemo- or radiation therapy necrobiosis lipoidica currently, diabetes, PVD, cirrhosis, CKD stage III, HTN, anemia Palliative Wound Management Criteria Care Approach Wound Care Plan: Complex Wound Management Electronic Signature(s) Signed: 06/27/2023 7:37:33 AM By: [name] RN, BSN Signed: 07/01/2023 3:45:26 PM By: [name] MD Entered By: [name] on 06/27/2023 07:37:32 -------------------------------------------------------------------------------- Encounter Discharge Information Details Patient Name: Date of Service: [name], [name] RRIS M. 06/25/2023 8:00 A M Medical Record Number: [id number] Patient Account Number: [id number] Date of Birth/Sex: Treating RN: [birth year] (67 y.o. [name] [name] Primary Care Stefan Markarian: [place] Mon Other Clinician: Referring Donell Tomkins: Treating Sofija Antwi/Extender: [name] in Treatment: 26 Encounter Discharge Information Items Discharge Condition: Stable Ambulatory Status: Ambulatory Discharge Destination: Home Transportation: Private Auto Accompanied By: sister Schedule Follow-up Appointment: Yes Clinical Summary of Care: Electronic  Signature(s) [name] [name], [name] [name] ([id number]) [id number] Page 4 of 14 Signed: 06/25/2023 4:47:01 PM By: [name] RN, BSN Entered By: [name] on 06/25/2023 08:16:59 -------------------------------------------------------------------------------- Lower Extremity Assessment Details Patient Name: Date of Service: [name], [name] 06/25/2023 8:00 A M Medical Record Number: [id number] Patient Account Number: [id number] Date of Birth/Sex: Treating RN: Sep 02, 1955 (67 y.o. [name] [name] Primary Care Jazen Spraggins: Ouida Sills,  Kingsley Callander Other Clinician: Referring Ismael Treptow: Treating Caroljean Monsivais/Extender: [name] in Treatment: 26 Edema Assessment Assessed: [name]: Yes] [name]: Yes] Edema: [Left: No] [Right: No] Calf Left: Right: Point of Measurement: 37 cm From Medial Instep 33 cm 33 cm Ankle Left: Right: Point of Measurement: 13 cm From Medial Instep 22 cm 22 cm Vascular Assessment Pulses: Dorsalis Pedis Palpable: [Left:Yes] [Right:Yes] Extremity colors, hair growth, and conditions: Extremity Color: [Left:Hyperpigmented] [Right:Hyperpigmented] Hair Growth on Extremity: [Left:No] [Right:No] Temperature of Extremity: [Left:Warm] [Right:Warm] Capillary Refill: [Left:< 3 seconds] [Right:< 3 seconds] Dependent Rubor: [Left:No] [Right:No] Blanched when Elevated: [Left:No Yes] [Right:No Yes] Toe Nail Assessment Left: Right: Thick: No No Discolored: No No Deformed: No No Improper Length and Hygiene: Yes Yes Electronic Signature(s) Signed: 06/25/2023 4:47:01 PM By: [name] RN, BSN Entered By: [name] on 06/25/2023 08:03:01 -------------------------------------------------------------------------------- Multi Wound Chart Details Patient Name: Date of Service: [name], [name] 06/25/2023 8:00 A M Medical Record Number: [id number] Patient Account Number: [id number] Date of Birth/Sex: Treating RN: October 02, 1955 (67 y.o. M) Primary  Care Zyana Amaro: [place] Mon Other Clinician: Referring Mishayla Sliwinski: Treating Georgian Mcclory/Extender: Marke, Jafari, Wadley ([id number]) [id number] Page 5 of 14 Weeks in Treatment: 26 Vital Signs Height(in): 72 Capillary Blood Glucose(mg/dl): 85 Weight(lbs): 086 Pulse(bpm): 56 Body Mass Index(BMI): 33.2 Blood Pressure(mmHg): 149/75 Temperature(F): 97.6 Respiratory Rate(breaths/min): 20 [3:Photos:] Right, Proximal, Anterior Lower Leg Left, Distal, Medial Lower Leg Right, Distal, Lateral Lower Leg Wound Location: Gradually Appeared Gradually Appeared Blister Wounding Event: Diabetic Wound/Ulcer of the Lower Diabetic Wound/Ulcer of the Lower Diabetic Wound/Ulcer of the Lower Primary Etiology: Extremity Extremity Extremity N/A Venous Leg Ulcer N/A Secondary Etiology: Anemia, Lymphedema, Hypertension, Anemia, Lymphedema, Hypertension, Anemia, Lymphedema, Hypertension, Comorbid History: Cirrhosis , Type II Diabetes Cirrhosis , Type II Diabetes Cirrhosis , Type II Diabetes 01/25/2023 02/11/2023 02/19/2023 Date Acquired: 20 19 18  Weeks of Treatment: Open Open Open Wound Status: No No No Wound Recurrence: No No Yes Clustered Wound: 3 N/A N/A Clustered Quantity: 0.6x0.7x0.2 0.3x0.4x0.3 0.5x0.5x0.2 Measurements L x W x D (cm) 0.33 0.094 0.196 A (cm) : rea 0.066 0.028 0.039 Volume (cm) : -364.80% 75.60% 69.20% % Reduction in A rea: -371.40% 26.30% 39.10% % Reduction in Volume: Grade 1 Grade 1 Grade 1 Classification: Medium Medium Medium Exudate A mount: Serosanguineous Serosanguineous Serosanguineous Exudate Type: red, brown red, brown red, brown Exudate Color: Distinct, outline attached Distinct, outline attached Distinct, outline attached Wound Margin: Large (67-100%) Small (1-33%) Large (67-100%) Granulation A mount: Red, Pink Pink Red, Pink Granulation Quality: Small (1-33%) Large (67-100%) Small (1-33%) Necrotic A  mount: Fat Layer (Subcutaneous Tissue): Yes Fat Layer (Subcutaneous Tissue): Yes Fat Layer (Subcutaneous Tissue): Yes Exposed Structures: Fascia: No Fascia: No Fascia: No Tendon: No Tendon: No Tendon: No Muscle: No Muscle: No Muscle: No Joint: No Joint: No Joint: No Bone: No Bone: No Bone: No Large (67-100%) Small (1-33%) Large (67-100%) Epithelialization: Excoriation: No Excoriation: No Excoriation: No Periwound Skin Texture: Induration: No Induration: No Induration: No Callus: No Callus: No Callus: No Crepitus: No Crepitus: No Crepitus: No Rash: No Rash: No Rash: No Scarring: No Scarring: No Scarring: No Maceration: No Maceration: No Maceration: No Periwound Skin Moisture: Dry/Scaly: No Dry/Scaly: No Dry/Scaly: No Atrophie Blanche: No Atrophie Blanche: No Atrophie Blanche: No Periwound Skin Color: Cyanosis: No Cyanosis: No Cyanosis: No Ecchymosis: No Ecchymosis: No Ecchymosis: No Erythema: No Erythema: No Erythema: No Hemosiderin Staining: No Hemosiderin Staining: No Hemosiderin Staining: No Mottled: No Mottled: No Mottled: No Pallor:  No Pallor: No Pallor: No Rubor: No Rubor: No Rubor: No No Abnormality No Abnormality N/A Temperature: Treatment Notes Wound #3 (Lower Leg) Wound Laterality: Right, Anterior, Proximal Cleanser Soap and Water Discharge Instruction: May shower and wash wound with dial antibacterial soap and water prior to dressing change. Peri-Wound Care Triamcinolone 15 (g) SIVAN, PAYAMPS ([id number]) [phone].pdf Page 6 of 14 Discharge Instruction: Use triamcinolone apply to entire both legs. around the wounds. Topical Triamcinolone Discharge Instruction: Apply Triamcinolone as directed Primary Dressing Endoform 2x2 in Discharge Instruction: Moisten with saline or [place] Jelly Secondary Dressing Woven Gauze Sponge, Non-Sterile 4x4 in Discharge Instruction: Apply over primary dressing as  directed. Secured With [employer], 4.5x3.1 (in/yd) Discharge Instruction: Secure with Kerlix as directed. 52M Medipore H Soft Cloth Surgical T ape, 4 x 10 (in/yd) Discharge Instruction: Secure with tape as directed. Compression Wrap Compression Stockings Add-Ons Wound #4 (Lower Leg) Wound Laterality: Left, Medial, Distal Cleanser Soap and Water Discharge Instruction: May shower and wash wound with dial antibacterial soap and water prior to dressing change. Peri-Wound Care Triamcinolone 15 (g) Discharge Instruction: Use triamcinolone apply to entire both legs. around the wounds. Topical Triamcinolone Discharge Instruction: Apply Triamcinolone as directed Primary Dressing Endoform 2x2 in Discharge Instruction: Moisten with saline or [place] Jelly Secondary Dressing Woven Gauze Sponge, Non-Sterile 4x4 in Discharge Instruction: Apply over primary dressing as directed. Secured With [employer], 4.5x3.1 (in/yd) Discharge Instruction: Secure with Kerlix as directed. 52M Medipore H Soft Cloth Surgical T ape, 4 x 10 (in/yd) Discharge Instruction: Secure with tape as directed. Compression Wrap Compression Stockings Add-Ons Wound #5 (Lower Leg) Wound Laterality: Right, Lateral, Distal Cleanser Soap and Water Discharge Instruction: May shower and wash wound with dial antibacterial soap and water prior to dressing change. Peri-Wound Care Triamcinolone 15 (g) Discharge Instruction: Use triamcinolone apply to entire both legs. around the wounds. Topical Triamcinolone Discharge Instruction: Apply Triamcinolone as directed BASIL, GADISON ([id number]) [phone].pdf Page 7 of 14 Primary Dressing Endoform 2x2 in Discharge Instruction: Moisten with saline or [place] Jelly Secondary Dressing Woven Gauze Sponge, Non-Sterile 4x4 in Discharge Instruction: Apply over primary dressing as directed. Secured With [employer], 4.5x3.1 (in/yd) Discharge  Instruction: Secure with Kerlix as directed. 52M Medipore H Soft Cloth Surgical T ape, 4 x 10 (in/yd) Discharge Instruction: Secure with tape as directed. Compression Wrap Compression Stockings Add-Ons Electronic Signature(s) Signed: 06/25/2023 4:19:19 PM By: [name] MD Entered By: [name] on 06/25/2023 08:22:35 -------------------------------------------------------------------------------- Multi-Disciplinary Care Plan Details Patient Name: Date of Service: [name], [name] 06/25/2023 8:00 A M Medical Record Number: [id number] Patient Account Number: [id number] Date of Birth/Sex: Treating RN: 31-Jan-1956 (67 y.o. [name] [name] Primary Care Havier Deeb: [place] Mon Other Clinician: Referring Zakiyyah Savannah: Treating Raynard Mapps/Extender: [name] in Treatment: 26 Active Inactive Pain, Acute or Chronic Nursing Diagnoses: Pain, acute or chronic: actual or potential Potential alteration in comfort, pain Goals: Patient will verbalize adequate pain control and receive pain control interventions during procedures as needed Date Initiated: 12/24/2022 Target Resolution Date: 08/02/2023 Goal Status: Active Patient/caregiver will verbalize comfort level met Date Initiated: 12/24/2022 Date Inactivated: 01/31/2023 Target Resolution Date: 02/14/2023 Goal Status: Met Interventions: Encourage patient to take pain medications as prescribed Provide education on pain management Treatment Activities: Administer pain control measures as ordered : 12/24/2022 Notes: Electronic Signature(s) Signed: 06/25/2023 4:47:01 PM By: [name] RN, BSN Entered By: [name] on 06/25/2023 08:09:18 [name] [name] ([id number]) [id number] Page 8 of 14 -------------------------------------------------------------------------------- Pain  Assessment Details Patient Name: Date of Service: [name] [name]. 06/25/2023 8:00 A M Medical Record  Number: [id number] Patient Account Number: [id number] Date of Birth/Sex: Treating RN: [birth year] (67 y.o. [name] [name] Primary Care Kadiatou Oplinger: [place] Mon Other Clinician: Referring Chuck Caban: Treating Marne Meline/Extender: [name] in Treatment: 26 Active Problems Location of Pain Severity and Description of Pain Patient Has Paino No Site Locations Pain Management and Medication Current Pain Management: Electronic Signature(s) Signed: 06/25/2023 4:47:01 PM By: [name] RN, BSN Entered By: [name] on 06/25/2023 07:58:21 -------------------------------------------------------------------------------- Patient/Caregiver Education Details Patient Name: Date of Service: [name] 11/19/2024andnbsp8:00 A M Medical Record Number: [id number] Patient Account Number: [id number] Date of Birth/Gender: Treating RN: 1955-10-11 (67 y.o. [name] [name] Primary Care Physician: [place] Mon Other Clinician: Referring Physician: Treating Physician/Extender: [name] in Treatment: 26 Education Assessment Education Provided To: Patient Education Topics Provided Wound/Skin Impairment: Handouts: Caring for Your Ulcer Methods: Explain/Verbal Responses: State content correctly CRECENCIO, CUPPETT ([id number]) [id number] Page 9 of 14 Electronic Signature(s) Signed: 06/25/2023 4:47:01 PM By: [name] RN, BSN Entered By: [name] on 06/25/2023 08:09:31 -------------------------------------------------------------------------------- Wound Assessment Details Patient Name: Date of Service: [name], [name] 06/25/2023 8:00 A M Medical Record Number: [id number] Patient Account Number: [id number] Date of Birth/Sex: Treating RN: 01-Nov-1955 (67 y.o. [name] [name], [name] [name] Primary Care Breckin Zafar: [place] Mon Other Clinician: Referring Juanice Warburton: Treating Ranny Wiebelhaus/Extender: [name] in Treatment: 26 Wound Status Wound Number: 3 Primary Diabetic Wound/Ulcer of the Lower Extremity Etiology: Wound Location: Right, Proximal, Anterior Lower Leg Wound Status: Open Wounding Event: Gradually Appeared Comorbid Anemia, Lymphedema, Hypertension, Cirrhosis , Type II Date Acquired: 01/25/2023 History: Diabetes Weeks Of Treatment: 20 Clustered Wound: No Photos Wound Measurements Length: (cm) Width: (cm) Depth: (cm) Clustered Quantity: Area: (cm) Volume: (cm) 0.6 % Reduction in Area: -364.8% 0.7 % Reduction in Volume: -371.4% 0.2 Epithelialization: Large (67-100%) 3 Tunneling: No 0.33 Undermining: No 0.066 Wound Description Classification: Grade 1 Wound Margin: Distinct, outline attached Exudate Amount: Medium Exudate Type: Serosanguineous Exudate Color: red, brown Foul Odor After Cleansing: No Slough/Fibrino Yes Wound Bed Granulation Amount: Large (67-100%) Exposed Structure Granulation Quality: Red, Pink Fascia Exposed: No Necrotic Amount: Small (1-33%) Fat Layer (Subcutaneous Tissue) Exposed: Yes Necrotic Quality: Adherent Slough Tendon Exposed: No Muscle Exposed: No Joint Exposed: No Bone Exposed: No Periwound Skin Texture Texture Color No Abnormalities Noted: No No Abnormalities Noted: No Callus: No [address][name] [name], [name] [name] ([id number]) [id number] Page 10 of 14 Crepitus: No Cyanosis: No Excoriation: No Ecchymosis: No Induration: No Erythema: No Rash: No Hemosiderin Staining: No Scarring: No Mottled: No Pallor: No Moisture Rubor: No No Abnormalities Noted: No Dry / Scaly: No Temperature / Pain Maceration: No Temperature: No Abnormality Treatment Notes Wound #3 (Lower Leg) Wound Laterality: Right, Anterior, Proximal Cleanser Soap and Water Discharge Instruction: May shower and wash wound with dial antibacterial soap and water prior to dressing change. Peri-Wound Care Triamcinolone 15  (g) Discharge Instruction: Use triamcinolone apply to entire both legs. around the wounds. Topical Triamcinolone Discharge Instruction: Apply Triamcinolone as directed Primary Dressing Endoform 2x2 in Discharge Instruction: Moisten with saline or [place] Jelly Secondary Dressing Woven Gauze Sponge, Non-Sterile 4x4 in Discharge Instruction: Apply over primary dressing as directed. Secured With [employer], 4.5x3.1 (in/yd) Discharge Instruction: Secure with Kerlix as directed. 43M Medipore H Soft Cloth Surgical T ape, 4 x 10 (in/yd) Discharge Instruction:  Secure with tape as directed. Compression Wrap Compression Stockings Add-Ons Electronic Signature(s) Signed: 06/25/2023 4:47:01 PM By: [name] RN, BSN Entered By: [name] on 06/25/2023 08:06:51 -------------------------------------------------------------------------------- Wound Assessment Details Patient Name: Date of Service: [name], [name] 06/25/2023 8:00 A M Medical Record Number: [id number] Patient Account Number: [id number] Date of Birth/Sex: Treating RN: Feb 08, 1956 (67 y.o. [name] [name] Primary Care Alyson Ki: [place] Mon Other Clinician: Referring Nadeen Shipman: Treating Melony Tenpas/Extender: [name] in Treatment: 26 Wound Status Wound Number: 4 Primary Etiology: Diabetic Wound/Ulcer of the Lower Extremity Wound Location: Left, Distal, Medial Lower Leg Secondary Venous Leg Ulcer Etiology: Wounding Event: Gradually Appeared Wound Status: Open Date Acquired: 02/11/2023 Comorbid Anemia, Lymphedema, Hypertension, Cirrhosis , Type II Weeks Of Treatment: 19 History: Diabetes Clustered Wound: No [name] [name], [name] [name] ([id number]) [id number] Page 11 of 14 Photos Wound Measurements Length: (cm) 0.3 Width: (cm) 0.4 Depth: (cm) 0.3 Area: (cm) 0.094 Volume: (cm) 0.028 % Reduction in Area: 75.6% % Reduction in Volume: 26.3% Epithelialization: Small  (1-33%) Tunneling: No Undermining: No Wound Description Classification: Grade 1 Wound Margin: Distinct, outline attached Exudate Amount: Medium Exudate Type: Serosanguineous Exudate Color: red, brown Foul Odor After Cleansing: No Slough/Fibrino Yes Wound Bed Granulation Amount: Small (1-33%) Exposed Structure Granulation Quality: Pink Fascia Exposed: No Necrotic Amount: Large (67-100%) Fat Layer (Subcutaneous Tissue) Exposed: Yes Necrotic Quality: Adherent Slough Tendon Exposed: No Muscle Exposed: No Joint Exposed: No Bone Exposed: No Periwound Skin Texture Texture Color No Abnormalities Noted: No No Abnormalities Noted: No Callus: No Atrophie Blanche: No Crepitus: No Cyanosis: No Excoriation: No Ecchymosis: No Induration: No Erythema: No Rash: No Hemosiderin Staining: No Scarring: No Mottled: No Pallor: No Moisture Rubor: No No Abnormalities Noted: No Dry / Scaly: No Temperature / Pain Maceration: No Temperature: No Abnormality Treatment Notes Wound #4 (Lower Leg) Wound Laterality: Left, Medial, Distal Cleanser Soap and Water Discharge Instruction: May shower and wash wound with dial antibacterial soap and water prior to dressing change. Peri-Wound Care Triamcinolone 15 (g) Discharge Instruction: Use triamcinolone apply to entire both legs. around the wounds. Topical Triamcinolone Discharge Instruction: Apply Triamcinolone as directed Primary Dressing Endoform 2x2 in Discharge Instruction: Moisten with saline or [facility] Secondary Dressing Woven Gauze Sponge, Non-Sterile 4x4 in [place] ([id number]) [id number] Page 12 of 14 Discharge Instruction: Apply over primary dressing as directed. Secured With [employer], 4.5x3.1 (in/yd) Discharge Instruction: Secure with Kerlix as directed. 56M Medipore H Soft Cloth Surgical T ape, 4 x 10 (in/yd) Discharge Instruction: Secure with tape as directed. Compression  Wrap Compression Stockings Add-Ons Electronic Signature(s) Signed: 06/25/2023 4:47:01 PM By: [name] RN, BSN Entered By: [name] on 06/25/2023 08:07:30 -------------------------------------------------------------------------------- Wound Assessment Details Patient Name: Date of Service: [name], [name] 06/25/2023 8:00 A M Medical Record Number: [id number] Patient Account Number: [id number] Date of Birth/Sex: Treating RN: [birth year] (67 y.o. [name] [name], [name] [name] Primary Care Davell Beckstead: [place] Mon Other Clinician: Referring Caleen Taaffe: Treating Abir Craine/Extender: [name] in Treatment: 26 Wound Status Wound Number: 5 Primary Diabetic Wound/Ulcer of the Lower Extremity Etiology: Wound Location: Right, Distal, Lateral Lower Leg Wound Status: Open Wounding Event: Blister Comorbid Anemia, Lymphedema, Hypertension, Cirrhosis , Type II Date Acquired: 02/19/2023 History: Diabetes Weeks Of Treatment: 18 Clustered Wound: Yes Photos Wound Measurements Length: (cm) 0.5 Width: (cm) 0.5 Depth: (cm) 0.2 Area: (cm) 0.196 Volume: (cm) 0.039 % Reduction in Area: 69.2% % Reduction in Volume: 39.1% Epithelialization: Large (67-100%) Tunneling: No Undermining:  No Wound Description Classification: Grade 1 Wound Margin: Distinct, outline attached Exudate Amount: Medium Exudate Type: Serosanguineous Exudate Color: red, brown Foul Odor After Cleansing: No Slough/Fibrino No Wound Bed Granulation Amount: Large (67-100%) Exposed Structure Granulation Quality: Red, Pink Fascia Exposed: No [name] [name], [name] [name] ([id number]) [id number] Page 13 of 14 Necrotic Amount: Small (1-33%) Fat Layer (Subcutaneous Tissue) Exposed: Yes Necrotic Quality: Adherent Slough Tendon Exposed: No Muscle Exposed: No Joint Exposed: No Bone Exposed: No Periwound Skin Texture Texture Color No Abnormalities Noted: No No Abnormalities Noted: No Callus:  No Atrophie Blanche: No Crepitus: No Cyanosis: No Excoriation: No Ecchymosis: No Induration: No Erythema: No Rash: No Hemosiderin Staining: No Scarring: No Mottled: No Pallor: No Moisture Rubor: No No Abnormalities Noted: No Dry / Scaly: No Maceration: No Treatment Notes Wound #5 (Lower Leg) Wound Laterality: Right, Lateral, Distal Cleanser Soap and Water Discharge Instruction: May shower and wash wound with dial antibacterial soap and water prior to dressing change. Peri-Wound Care Triamcinolone 15 (g) Discharge Instruction: Use triamcinolone apply to entire both legs. around the wounds. Topical Triamcinolone Discharge Instruction: Apply Triamcinolone as directed Primary Dressing Endoform 2x2 in Discharge Instruction: Moisten with saline or [place] Jelly Secondary Dressing Woven Gauze Sponge, Non-Sterile 4x4 in Discharge Instruction: Apply over primary dressing as directed. Secured With [employer], 4.5x3.1 (in/yd) Discharge Instruction: Secure with Kerlix as directed. 72M Medipore H Soft Cloth Surgical T ape, 4 x 10 (in/yd) Discharge Instruction: Secure with tape as directed. Compression Wrap Compression Stockings Add-Ons Electronic Signature(s) Signed: 06/25/2023 4:47:01 PM By: [name] RN, BSN Entered By: [name] on 06/25/2023 08:07:54 -------------------------------------------------------------------------------- Vitals Details Patient Name: Date of Service: [name], [name] 06/25/2023 8:00 A M Medical Record Number: [id number] Patient Account Number: [id number] Date of Birth/Sex: Treating RN: 1955/12/01 (67 y.o. [name] [name] Primary Care Janita Camberos: [place] Mon Other Clinician: Referring Kionte Baumgardner: Treating Mahi Zabriskie/Extender: [name] [name], [name] [name], [place] ([id number]) [id number] Page 14 of 14 Weeks in Treatment: 26 Vital Signs Time Taken: 07:58 Temperature (F): 97.6 Height (in): 72 Pulse  (bpm): 56 Weight (lbs): 245 Respiratory Rate (breaths/min): 20 Body Mass Index (BMI): 33.2 Blood Pressure (mmHg): 149/75 Capillary Blood Glucose (mg/dl): 85 Reference Range: 80 - 120 mg / dl Electronic Signature(s) Signed: 06/25/2023 4:47:01 PM By: [name] RN, BSN Entered By: [name] on 06/25/2023 08:03:32

## 2023-06-25 NOTE — Progress Notes (Signed)
[name] [name], DOOSE ([id number]) [id number] Page 1 of 8 Visit Report for 06/25/2023 HPI Details Patient Name: Date of Service: [name] RRIS M. 06/25/2023 8:00 A M Medical Record Number: [id number] Patient Account Number: [id number] Date of Birth/Sex: Treating RN: 1955/08/12 (67 y.o. M) Primary Care Provider: Cranford Mon Other Clinician: Referring Provider: Treating Provider/Extender: [name] in Treatment: 26 History of Present Illness HPI Description: 12/24/2022 [name] is a 67 year old male with a past medical history of controlled type 2 diabetes on insulin, lymphedema/chronic venous insufficiency and cirrhosis that presents to the clinic for a 1 year history of nonhealing wounds to his lower extremities bilaterally. He reports the starting spontaneously. He states that he has been following with [facility] and they have been using [facility] antibiotic spray to the legs. He is not wearing compression stockings or wraps. He is also tried Medihoney and collagen in the past with little benefit. He currently denies systemic signs of infection. 5/30; patient presents for follow-up. He has been using triamcinolone cream to the periwound and Santyl to the wound beds. There has been improvement in wound healing. He denies signs of infection and has no issues or complaints today. 6/4; both legs look considerably better. He has a wound on the right lateral lower leg and the left medial lower leg. Significant hemosiderin in the right leg less so on the left. We have been using Hydrofera Blue under compression 6/13; patient presents for follow-up. We have been using antibiotic ointment with Hydrofera Blue under compression therapy. The wounds are smaller. He has no issues or complaints today. We discussed ordering juxta lite compression garment wraps T use once his wounds heal as he has hard time putting on o compression  stockings. He was agreeable with this. 6/20; patient presents for follow-up. We have been using antibiotic ointment with Hydrofera Blue under compression therapy to the lower extremities bilaterally. Wounds are smaller. He has been approved for PuraPly and was agreeable with placement today. He denies signs of infection. 6/27; patient presents for follow-up. We have been using antibiotic ointment with Hydrofera Blue under compression therapy to the left lower extremity. This wound is healed. He has his juxta lite compression wraps with him today. T the right lateral leg we have been using PuraPly under compression and that wound o is smaller today. Unfortunately he has developed skin breakdown from the Steri-Strip that was used to hold the PuraPly in place and this has created a new wound on the right leg. 7/8; patient presents for follow-up. We have been using PuraPly to the right lateral leg wound Under compression wrap. Wound is slightly smaller however original wound used for PuraPly is healed. He unfortunately developed a new wound to the left lower extremity but it looks like this was from potential scratching. He has been using his juxta lite compression here. 716; patient presents for follow-up. We have been using antibiotic ointment with Hydrofera Blue under 3 layer compression to lower extremities bilaterally. Unfortunately he has continued to develop wounds to the left lower extremity. He currently denies signs of infection. 7/22; patient presents for follow-up. He has been using Santyl and Hydrofera Blue with triamcinolone cream under her juxta lite compression wraps daily. No new wounds today. Current wounds are stable. Less irritation to the periwound. 7/29; patient presents for follow-up. He has been using Santyl and Hydrofera Blue with triamcinolone cream under the juxta lite compression wraps. He is having a hard time  keeping the [facility] in place to the wound beds. He denies  signs of infection. 8/5; patient presents for follow-up. Has been using Santyl to the wound beds and triamcinolone cream under the juxta lite compression. He has no issues or complaints today. Wounds are overall stable. 8/12; patient presents for follow-up. He has been using Santyl to the wound beds and triamcinolone cream to the periwound under JuxtaLite compression. He is scheduled to see dermatology this week. Wounds are overall stable. 8/19; patient presents for follow-up. He has been using Santyl and PolyMem to the wound beds along with triamcinolone cream to the periwound under his juxta light compression. He saw dermatology and they obtained a biopsy of the distal medial wound on the left leg. He also had venous reflux studies last week that showed no venous reflux on the left and only venous reflux to the CFV with no venous reflux throughout the rest of the venous system. 9/3;Patient presents for follow-up. Patient has been using PolyMem silver with Santyl. Patient was seen by dermatology and they did a punch biopsy. 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 Patient was prescribed silver Silvadene. He has not started this yet. Overall wounds appear well-healing. 9/9; patient presents for follow-up. We have been using PolyMem silver with Santyl. Wounds are smaller. He has been using his compression Velcro wrap daily. He declines debridement today. 9/23; this is a patient who clearly has chronic venous insufficiency. For about the last 18 months he has developed recurrent small nodules on his bilateral lower legs these are painful initially seemed to have a white substance on the top they then break open and eventually will heal over. These occurred even while he was under compression. 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 The patient has been using Santyl and an polymen Ag. He has been using his  own compression stockings. 9/30; everything is just about healed on the left leg. Still has 2 small superficial areas on the left. On the right 2 areas with depth I wonder whether these were necrobiosis lipoidica lesions that the dermatologist diagnosed by biopsying one of the areas on the left. I reviewed the note after they left the clinic last week. 10/7; still with the 2 spots which are small wounds on the right anterior and right lateral lower leg. These have very adherent slough. He also has a small painful area on the left posterior Achilles and the left posterior lower leg. The biopsy apparently showed necrobiosis lipoidica. I think this is the cause of the recurrent wounds over the last 18 months. DIOMEDES, ORRIS ([id number]) [id number] Page 2 of 8 10/14; the areas on the left are smaller. This includes the biopsy site on the left Achilles area. The areas on the right are about the same this is the right lateral and right anterior lower leg. I debrided both of these last week he did not want to be debrided today. As it turns out he has 1 pound jar of triamcinolone 0.1% I not sure what that was being used for. Some changes of chronic stasis dermatitis perhaps when he first came in according to our intake nurse. 10/22; again no change in most of these areas. He does not want debridements. We have been using triamcinolone Santyl and Hydrofera Blue but very little change. 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 We have 2 areas on the right  and 2 areas on the left 1 of which was the biopsy site 10/29; some improvement. The superior wounds on both sides have dried up eschar on the surface he has not been allowing debridement but these look better. The deeper punched-out area on the right also appears to have a better surface. The biopsy site again eschared over but looks as though it is contracting. I changed to Iodosorb last week with  TCA. Multiple prior wounds likely necrobiosis although he does have venous insufficiency 11/12; 2-week follow-up. The patient has 2 open wounds on the right 1 on the lower anterior and 1 on the lower medial leg. He has a small open area on the left medial lower leg. I have looked through his previous records. He has biopsy-proven necrobiosis lipoidica and atrophie blanche. The wounds are small and punched-out look more like the remanence of necrobiosis then venous wounds. Nevertheless his wife shows me pictures of his legs where he currently has chronic venous insufficiency with stasis dermatitis. 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 We have improved the condition of the chronic venous insufficiency and stasis with compression. With regards to the 3 open areas 2 on the right and 1 on the left lower medial leg all of these look somewhat better although there is still some depth. At the 2 on the right have a clean base no debridement is required, on the left the wound still has some depth although the base of the wound looks better with the opening almost too small to allow debridement. Electronic Signature(s) Signed: 06/25/2023 4:19:19 PM By: [name] MD Entered By: [name] on 06/25/2023 05:23:57 -------------------------------------------------------------------------------- Physical Exam Details Patient Name: Date of Service: [name], [name] 06/25/2023 8:00 A M Medical Record Number: [id number] Patient Account Number: [id number] Date of Birth/Sex: Treating RN: 1956-03-09 (67 y.o. M) Primary Care Provider: Cranford Mon Other Clinician: Referring Provider: Treating Provider/Extender: [name] in Treatment: 26 Notes Wound exam; patient has hemosiderin staining in his lower legs but his  edema is well-controlled there is no inflammation. 2 small areas on the right anteriorly and medially and 1 on the left medial ankle area. Both of the wounds on the right have clean bases. The distal 1 has round senescent edges which may need to be addressed with mechanical debridement. On the left the wound is small the surface of it is better. Almost too small to get a small curette and for debridement Electronic Signature(s) Signed: 06/25/2023 4:19:19 PM By: [name] MD Entered By: [name] on 06/25/2023 05:25:58 -------------------------------------------------------------------------------- Physician Orders Details Patient Name: Date of Service: [name], [name] 06/25/2023 8:00 A M Medical Record Number: [id number] Patient Account Number: [id number] Date of Birth/Sex: Treating RN: [birth year] (66 y.o. [name] Primary Care Provider: Cranford Mon Other Clinician: Referring Provider: Treating Provider/Extender: [name] in Treatment: 26 The following information was scribed by: [name] The information was scribed for: [name] Verbal / Phone Orders: No Diagnosis Coding HILMER, LITWAK ([id number]) [id number] Page 3 of 8 ICD-10 Coding Code Description I87.313 Chronic venous hypertension (idiopathic) with ulcer of bilateral lower extremity L97.818 Non-pressure chronic ulcer of other part of right lower leg with other specified severity L97.828 Non-pressure chronic ulcer of other part of left lower leg with other specified severity  I89.0 Lymphedema, not elsewhere classified E11.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 other skin ulcer Follow-up Appointments ppointment in 2 weeks. - 07/09/23 0800 Dr. Leanord Hawking (already scheduled) Return A Anesthetic (In clinic) Topical Lidocaine 4% applied to wound bed Bathing/ Shower/ Hygiene May shower and wash wound with soap and water. Wound Treatment Wound #3 -  Lower Leg Wound Laterality: Right, Anterior, Proximal Cleanser: Soap and Water Every Other Day/30 Days Discharge Instructions: May shower and wash wound with dial antibacterial soap and water prior to dressing change. Peri-Wound Care: Triamcinolone 15 (g) Every Other Day/30 Days Discharge Instructions: Use triamcinolone apply to entire both legs. around the wounds. Topical: Triamcinolone Every Other Day/30 Days Discharge Instructions: Apply Triamcinolone as directed Prim Dressing: Endoform 2x2 in Every Other Day/30 Days ary Discharge Instructions: Moisten with saline or [place] Jelly Secondary Dressing: Woven Gauze Sponge, Non-Sterile 4x4 in (Generic) Every Other Day/30 Days Discharge Instructions: Apply over primary dressing as directed. Secured With: [employer], 4.5x3.1 (in/yd) (Generic) Every Other Day/30 Days Discharge Instructions: Secure with Kerlix as directed. Secured With: 12M Medipore H Soft Cloth Surgical T ape, 4 x 10 (in/yd) (Generic) Every Other Day/30 Days Discharge Instructions: Secure with tape as directed. Wound #4 - Lower Leg Wound Laterality: Left, Medial, Distal Cleanser: Soap and Water Every Other Day/30 Days Discharge Instructions: May shower and wash wound with dial antibacterial soap and water prior to dressing change. Peri-Wound Care: Triamcinolone 15 (g) Every Other Day/30 Days Discharge Instructions: Use triamcinolone apply to entire both legs. around the wounds. Topical: Triamcinolone Every Other Day/30 Days Discharge Instructions: Apply Triamcinolone as directed Prim Dressing: Endoform 2x2 in Every Other Day/30 Days ary Discharge Instructions: Moisten with saline or [place] Jelly Secondary Dressing: Woven Gauze Sponge, Non-Sterile 4x4 in (Generic) Every Other Day/30 Days Discharge Instructions: Apply over primary dressing as directed. Secured With: [employer], 4.5x3.1 (in/yd) (Generic) Every Other Day/30 Days Discharge Instructions: Secure with Kerlix  as directed. Secured With: 12M Medipore H Soft Cloth Surgical T ape, 4 x 10 (in/yd) (Generic) Every Other Day/30 Days Discharge Instructions: Secure with tape as directed. Wound #5 - Lower Leg Wound Laterality: Right, Lateral, Distal Cleanser: Soap and Water Every Other Day/30 Days Discharge Instructions: May shower and wash wound with dial antibacterial soap and water prior to dressing change. Peri-Wound Care: Triamcinolone 15 (g) Every Other Day/30 Days Discharge Instructions: Use triamcinolone apply to entire both legs. around the wounds. Topical: Triamcinolone Every Other Day/30 Days Discharge Instructions: Apply Triamcinolone as directed Prim Dressing: Endoform 2x2 in Every Other Day/30 Days ary Discharge Instructions: Moisten with saline or [address] ERIC, [place] ([id number]) [id number] Page 4 of 8 Secondary Dressing: Woven Gauze Sponge, Non-Sterile 4x4 in (Generic) Every Other Day/30 Days Discharge Instructions: Apply over primary dressing as directed. Secured With: [employer], 4.5x3.1 (in/yd) (Generic) Every Other Day/30 Days Discharge Instructions: Secure with Kerlix as directed. Secured With: 12M Medipore H Soft Cloth Surgical T ape, 4 x 10 (in/yd) (Generic) Every Other Day/30 Days Discharge Instructions: Secure with tape as directed. Electronic Signature(s) Signed: 06/25/2023 4:19:19 PM By: [name] MD Signed: 06/25/2023 4:47:01 PM By: [name] RN, BSN Entered By: [name] on 06/25/2023 05:14:10 -------------------------------------------------------------------------------- Problem List Details Patient Name: Date of Service: [name], [name] 06/25/2023 8:00 A M Medical Record Number: [id number] Patient Account Number: [id number] Date of Birth/Sex: Treating RN: 1956-02-11 (67 y.o. [name] Primary Care Provider: Cranford Mon Other Clinician: Referring Provider: Treating Provider/Extender: Loree Fee  O Weeks in Treatment: 26 Active Problems ICD-10 Encounter Code Description Active Date MDM Diagnosis I87.313 Chronic venous hypertension (idiopathic) with ulcer of bilateral lower extremity 12/24/2022 No Yes L97.818 Non-pressure chronic ulcer of other part of right lower leg with other specified 12/24/2022 No Yes severity L97.828 Non-pressure chronic ulcer of other part of left lower leg with other specified 12/24/2022 No Yes severity I89.0 Lymphedema, not elsewhere classified 12/24/2022 No Yes E11.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 other skin ulcer 12/24/2022 No Yes Inactive Problems ICD-10 Code Description Active Date Inactive Date 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 06/25/2023 8:00 A M Medical Record Number: [id number] Patient Account Number: [id number] Date of Birth/Sex: Treating RN: 1955/12/15 (67 y.o. M) Primary Care Provider: Cranford Mon Other Clinician: Referring Provider: Treating Provider/Extender: [name] in Treatment: 26 Subjective History of Present Illness (HPI) 12/24/2022 Mr. Tristian Rairigh is a 67 year old male with a past medical history of controlled type 2 diabetes on insulin, lymphedema/chronic venous insufficiency and cirrhosis that presents to the clinic for a 1 year history of nonhealing wounds to his lower extremities bilaterally. He reports the starting spontaneously. He states that he has been following with [facility] and they have been using [facility] antibiotic spray  to the legs. He is not wearing compression stockings or wraps. He is also tried Medihoney and collagen in the past with little benefit. He currently denies systemic signs of infection. 5/30; patient presents for follow-up. He has been using triamcinolone cream to the periwound and Santyl to the wound beds. There has been improvement in wound healing. He denies signs of infection and has no issues or complaints today. 6/4; both legs look considerably better. He has a wound on the right lateral lower leg and the left medial lower leg. Significant hemosiderin in the right leg less so on the left. We have been using Hydrofera Blue under compression 6/13; patient presents for follow-up. We have been using antibiotic ointment with Hydrofera Blue under compression therapy. The wounds are smaller. He has no issues or complaints today. We discussed ordering juxta lite compression garment wraps T use once his wounds heal as he has hard time putting on o compression stockings. He was agreeable with this. 6/20; patient presents for follow-up. We have been using antibiotic ointment with Hydrofera Blue under compression therapy to the lower extremities bilaterally. Wounds are smaller. He has been approved for PuraPly and was agreeable with placement today. He denies signs of infection. 6/27; patient presents for follow-up. We have been using antibiotic ointment with Hydrofera Blue under compression therapy to the left lower extremity. This wound is healed. He has his juxta lite compression wraps with him today. T the right lateral leg we have been using PuraPly under compression and that wound o is smaller today. Unfortunately he has developed skin breakdown from the Steri-Strip that was used to hold the PuraPly in place and this has created a new wound on the right leg. 7/8; patient presents for follow-up. We have been using PuraPly to the right lateral leg wound Under compression wrap. Wound is slightly smaller  however original wound used for PuraPly is healed. He unfortunately developed a new wound to the left  lower extremity but it looks like this was from potential scratching. He has been using his juxta lite compression here. 716; patient presents for follow-up. We have been using antibiotic ointment with Hydrofera Blue under 3 layer compression to lower extremities bilaterally. Unfortunately he has continued to develop wounds to the left lower extremity. He currently denies signs of infection. 7/22; patient presents for follow-up. He has been using Santyl and Hydrofera Blue with triamcinolone cream under her juxta lite compression wraps daily. No new wounds today. Current wounds are stable. Less irritation to the periwound. 7/29; patient presents for follow-up. He has been using Santyl and Hydrofera Blue with triamcinolone cream under the juxta lite compression wraps. He is having a hard time keeping the [facility] in place to the wound beds. He denies signs of infection. 8/5; patient presents for follow-up. Has been using Santyl to the wound beds and triamcinolone cream under the juxta lite compression. He has no issues or complaints today. Wounds are overall stable. 8/12; patient presents for follow-up. He has been using Santyl to the wound beds and triamcinolone cream to the periwound under JuxtaLite compression. He is scheduled to see dermatology this week. Wounds are overall stable. 8/19; patient presents for follow-up. He has been using Santyl and PolyMem to the wound beds along with triamcinolone cream to the periwound under his juxta light compression. He saw dermatology and they obtained a biopsy of the distal medial wound on the left leg. He also had venous reflux studies last week that showed no venous reflux on the left and only venous reflux to the CFV with no venous reflux throughout the rest of the venous system. 9/3;Patient presents for follow-up. Patient has been using PolyMem  silver with Santyl. Patient was seen by dermatology and they did a punch biopsy. 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 Patient was prescribed silver Silvadene. He has not started this yet. Overall wounds appear well-healing. 9/9; patient presents for follow-up. We have been using PolyMem silver with Santyl. Wounds are smaller. He has been using his compression Velcro wrap daily. He declines debridement today. 9/23; this is a patient who clearly has chronic venous insufficiency. For about the last 18 months he has developed recurrent small nodules on his bilateral lower legs these are painful initially seemed to have a white substance on the top they then break open and eventually will heal over. These occurred even while he was under compression. 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 The patient has been using Santyl and an polymen Ag. He has been using his own compression stockings. 9/30; everything is just about healed on the left leg. Still has 2 small superficial areas on the left. On the right 2 areas with depth I wonder whether these were necrobiosis lipoidica lesions that the dermatologist diagnosed by biopsying one of the areas on the left. I reviewed the note after they left the clinic last week. 10/7; still with the 2 spots which are small wounds on the right anterior and right lateral lower leg. These have very adherent slough. He also has a small painful FINNIS, SWALLEY ([id number]) [id number] Page 6 of 8 area on the left posterior Achilles and the left posterior lower leg. The biopsy apparently showed necrobiosis lipoidica. I think this is the cause of the recurrent wounds over the last 18 months. 10/14; the areas on the left are smaller. This includes the  biopsy site on the left Achilles area. The areas on the right are about the same this is the right  lateral and right anterior lower leg. I debrided both of these last week he did not want to be debrided today. As it turns out he has 1 pound jar of triamcinolone 0.1% I not sure what that was being used for. Some changes of chronic stasis dermatitis perhaps when he first came in according to our intake nurse. 10/22; again no change in most of these areas. He does not want debridements. We have been using triamcinolone Santyl and Hydrofera Blue but very little change. 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 We have 2 areas on the right and 2 areas on the left 1 of which was the biopsy site 10/29; some improvement. The superior wounds on both sides have dried up eschar on the surface he has not been allowing debridement but these look better. The deeper punched-out area on the right also appears to have a better surface. The biopsy site again eschared over but looks as though it is contracting. I changed to Iodosorb last week with TCA. Multiple prior wounds likely necrobiosis although he does have venous insufficiency 11/12; 2-week follow-up. The patient has 2 open wounds on the right 1 on the lower anterior and 1 on the lower medial leg. He has a small open area on the left medial lower leg. I have looked through his previous records. He has biopsy-proven necrobiosis lipoidica and atrophie blanche. The wounds are small and punched-out look more like the remanence of necrobiosis then venous wounds. Nevertheless his wife shows me pictures of his legs where he currently has chronic venous insufficiency with stasis dermatitis. 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 We have improved the condition of the chronic venous insufficiency and stasis with compression. With regards to the 3 open areas 2 on the right and 1  on the left lower medial leg all of these look somewhat better although there is still some depth. At the 2 on the right have a clean base no debridement is required, on the left the wound still has some depth although the base of the wound looks better with the opening almost too small to allow debridement. Objective Constitutional Vitals Time Taken: 7:58 AM, Height: 72 in, Weight: 245 lbs, BMI: 33.2, Temperature: 97.6 F, Pulse: 56 bpm, Respiratory Rate: 20 breaths/min, Blood Pressure: 149/75 mmHg, Capillary Blood Glucose: 85 mg/dl. Integumentary (Hair, Skin) Wound #3 status is Open. Original cause of wound was Gradually Appeared. The date acquired was: 01/25/2023. The wound has been in treatment 20 weeks. The wound is located on the Right,Proximal,Anterior Lower Leg. The wound measures 0.6cm length x 0.7cm width x 0.2cm depth; 0.33cm^2 area and 0.066cm^3 volume. There is Fat Layer (Subcutaneous Tissue) exposed. There is no tunneling or undermining noted. There is a medium amount of serosanguineous drainage noted. The wound margin is distinct with the outline attached to the wound base. There is large (67-100%) red, pink granulation within the wound bed. There is a small (1-33%) amount of necrotic tissue within the wound bed including Adherent Slough. The periwound skin appearance did not exhibit: Callus, Crepitus, Excoriation, Induration, Rash, Scarring, Dry/Scaly, Maceration, Atrophie Blanche, Cyanosis, Ecchymosis, Hemosiderin Staining, Mottled, Pallor, Rubor, Erythema. Periwound temperature was noted as No Abnormality. Wound #4 status is Open. Original  cause of wound was Gradually Appeared. The date acquired was: 02/11/2023. The wound has been in treatment 19 weeks. The wound is located on the Left,Distal,Medial Lower Leg. The wound measures 0.3cm length x 0.4cm width x 0.3cm depth; 0.094cm^2 area and 0.028cm^3 volume. There is Fat Layer (Subcutaneous Tissue) exposed. There is no tunneling or  undermining noted. There is a medium amount of serosanguineous drainage noted. The wound margin is distinct with the outline attached to the wound base. There is small (1-33%) pink granulation within the wound bed. There is a large (67- 100%) amount of necrotic tissue within the wound bed including Adherent Slough. The periwound skin appearance did not exhibit: Callus, Crepitus, Excoriation, Induration, Rash, Scarring, Dry/Scaly, Maceration, Atrophie Blanche, Cyanosis, Ecchymosis, Hemosiderin Staining, Mottled, Pallor, Rubor, Erythema. Periwound temperature was noted as No Abnormality. Wound #5 status is Open. Original cause of wound was Blister. The date acquired was: 02/19/2023. The wound has been in treatment 18 weeks. The wound is located on the Right,Distal,Lateral Lower Leg. The wound measures 0.5cm length x 0.5cm width x 0.2cm depth; 0.196cm^2 area and 0.039cm^3 volume. There is Fat Layer (Subcutaneous Tissue) exposed. There is no tunneling or undermining noted. There is a medium amount of serosanguineous drainage noted. The wound margin is distinct with the outline attached to the wound base. There is large (67-100%) red, pink granulation within the wound bed. There is a small (1- 33%) amount of necrotic tissue within the wound bed including Adherent Slough. The periwound skin appearance did not exhibit: Callus, Crepitus, Excoriation, Induration, Rash, Scarring, Dry/Scaly, Maceration, Atrophie Blanche, Cyanosis, Ecchymosis, Hemosiderin Staining, Mottled, Pallor, Rubor, Erythema. 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 - 07/09/23 0800 Dr. Leanord Hawking (already scheduled) Anesthetic: (In clinic) Topical Lidocaine 4% applied to wound bed Bathing/ Shower/ Hygiene: May shower and wash wound with soap and water. WOUND #3: - Lower Leg Wound Laterality: Right, Anterior, Proximal Cleanser: Soap and Water Every Other Day/30 Days Discharge Instructions: May shower and wash wound with dial antibacterial soap and water prior to dressing change. Peri-Wound Care: Triamcinolone 15 (g) Every Other Day/30 Days Discharge Instructions: Use triamcinolone apply to entire both legs. around the wounds. Topical: Triamcinolone Every Other Day/30 Days Discharge Instructions: Apply Triamcinolone as directed Prim Dressing: Endoform 2x2 in Every Other Day/30 Days ary Discharge Instructions: Moisten with saline or [place] Jelly Secondary Dressing: Woven Gauze Sponge, Non-Sterile 4x4 in (Generic) Every Other Day/30 Days Discharge Instructions: Apply over primary dressing as directed. Secured With: [employer], 4.5x3.1 (in/yd) (Generic) Every Other Day/30 Days Discharge Instructions: Secure with Kerlix as directed. Secured With: 37M Medipore H Soft Cloth Surgical T ape, 4 x 10 (in/yd) (Generic) Every Other Day/30 Days Discharge Instructions: Secure with tape as directed. WOUND #4: - Lower Leg Wound Laterality: Left, Medial, Distal Cleanser: Soap and Water Every Other Day/30 Days Discharge Instructions: May shower and wash wound with dial antibacterial soap and water prior to dressing change. Peri-Wound Care: Triamcinolone 15 (g) Every Other Day/30 Days Discharge Instructions: Use triamcinolone apply to entire both legs. around the wounds. Topical: Triamcinolone Every Other Day/30 Days Discharge Instructions: Apply Triamcinolone as directed Prim  Dressing: Endoform 2x2 in Every Other Day/30 Days ary Discharge Instructions: Moisten with  saline or [place] Jelly Secondary Dressing: Woven Gauze Sponge, Non-Sterile 4x4 in (Generic) Every Other Day/30 Days Discharge Instructions: Apply over primary dressing as directed. Secured With: [employer], 4.5x3.1 (in/yd) (Generic) Every Other Day/30 Days Discharge Instructions: Secure with Kerlix as directed. Secured With: 55M Medipore H Soft Cloth Surgical T ape, 4 x 10 (in/yd) (Generic) Every Other Day/30 Days Discharge Instructions: Secure with tape as directed. WOUND #5: - Lower Leg Wound Laterality: Right, Lateral, Distal Cleanser: Soap and Water Every Other Day/30 Days Discharge Instructions: May shower and wash wound with dial antibacterial soap and water prior to dressing change. Peri-Wound Care: Triamcinolone 15 (g) Every Other Day/30 Days Discharge Instructions: Use triamcinolone apply to entire both legs. around the wounds. Topical: Triamcinolone Every Other Day/30 Days Discharge Instructions: Apply Triamcinolone as directed Prim Dressing: Endoform 2x2 in Every Other Day/30 Days ary Discharge Instructions: Moisten with saline or [place] Jelly Secondary Dressing: Woven Gauze Sponge, Non-Sterile 4x4 in (Generic) Every Other Day/30 Days Discharge Instructions: Apply over primary dressing as directed. Secured With: [employer], 4.5x3.1 (in/yd) (Generic) Every Other Day/30 Days Discharge Instructions: Secure with Kerlix as directed. Secured With: 55M Medipore H Soft Cloth Surgical T ape, 4 x 10 (in/yd) (Generic) Every Other Day/30 Days Discharge Instructions: Secure with tape as directed. 1. The patient has chronic venous insufficiency and stasis dermatitis this is all a lot better as compared with pictures that the wife is able to show me last week on her phone. He will need lifelong compression therapy 2. Necrobiosis lipoidica is likely the cause of the small circular wounds 2 on the right and 1 on the left. Has steroid responsive lesion. We are applying TCA directly to the  wounds with endoform over the top. 3. Will see him back in 2 weeks Electronic Signature(s) Signed: 06/25/2023 4:19:19 PM By: [name] MD Entered By: [name] on 06/25/2023 05:27:27 -------------------------------------------------------------------------------- SuperBill Details Patient Name: Date of Service: [name], [name] 06/25/2023 Medical Record Number: [id number] Patient Account Number: [id number] Date of Birth/Sex: Treating RN: [birth year] (67 y.o. [name] Primary Care Provider: Cranford Mon Other Clinician: Referring Provider: Treating Provider/Extender: [name] in Treatment: [address] Diagnosis Coding ICD-10 Codes GURINDER, FLAUM ([id number]) [id number] Page 8 of 8 Code Description I87.313 Chronic venous hypertension (idiopathic) with ulcer of bilateral lower extremity L97.818 Non-pressure chronic ulcer of other part of right lower leg with other specified severity L97.828 Non-pressure chronic ulcer of other part of left lower leg with other specified severity I89.0 Lymphedema, not elsewhere classified E11.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 other skin ulcer Facility Procedures : CPT4 Code: [id number] Description: 41324 - WOUND CARE VISIT-LEV 5 EST PT Modifier: Quantity: 1 Physician Procedures : CPT4 Code Description Modifier [id number] - WC PHYS LEVEL 3 - EST PT ICD-10 Diagnosis Description I87.313 Chronic venous hypertension (idiopathic) with ulcer of bilateral lower extremity L97.818 Non-pressure chronic ulcer of other part of right  lower leg with other specified severity L97.828 Non-pressure chronic ulcer of other part of left lower leg with other specified severity I89.0 Lymphedema, not elsewhere classified Quantity: 1 Electronic Signature(s) Signed: 06/25/2023 4:19:19 PM By: [name] MD Entered By: [name] on 06/25/2023 05:27:49

## 2023-07-09 ENCOUNTER — Encounter (HOSPITAL_BASED_OUTPATIENT_CLINIC_OR_DEPARTMENT_OTHER): Payer: Medicare Other | Attending: Internal Medicine | Admitting: Internal Medicine

## 2023-07-09 DIAGNOSIS — L97828 Non-pressure chronic ulcer of other part of left lower leg with other specified severity: Secondary | ICD-10-CM | POA: Diagnosis not present

## 2023-07-09 DIAGNOSIS — I87313 Chronic venous hypertension (idiopathic) with ulcer of bilateral lower extremity: Secondary | ICD-10-CM | POA: Diagnosis present

## 2023-07-09 DIAGNOSIS — Z794 Long term (current) use of insulin: Secondary | ICD-10-CM | POA: Insufficient documentation

## 2023-07-09 DIAGNOSIS — E11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 other skin ulcer: Secondary | ICD-10-CM | POA: Diagnosis not present

## 2023-07-09 DIAGNOSIS — L97818 Non-pressure chronic ulcer of other part of right lower leg with other specified severity: Secondary | ICD-10-CM | POA: Insufficient documentation

## 2023-07-09 DIAGNOSIS — I890 Lymphedema, not elsewhere classified: Secondary | ICD-10-CM | POA: Diagnosis not present

## 2023-07-09 DIAGNOSIS — K7460 Unspecified cirrhosis of liver: Secondary | ICD-10-CM | POA: Diagnosis not present

## 2023-07-09 NOTE — Progress Notes (Addendum)
HUTCHISON, REDDISH ([id number]) [id number] Page 1 of 10 Visit Report for 07/09/2023 Arrival Information Details Patient Name: Date of Service: [name] [name] [name]. 07/09/2023 8:00 A M Medical Record Number: [id number] Patient Account Number: [id number] Date of Birth/Sex: Treating RN: 09/27/1955 (67 y.o. M) Primary Care Orpha Dain: [place] Mon Other Clinician: Referring Ghina Bittinger: Treating Kayceon Oki/Extender: [name] in Treatment: 28 Visit Information History Since Last Visit Added or deleted any medications: No Patient Arrived: Ambulatory Any new allergies or adverse reactions: No Arrival Time: 08:10 Had a fall or experienced change in No Accompanied By: Wife activities of daily living that may affect Transfer Assistance: None risk of falls: Patient Identification Verified: Yes Signs or symptoms of abuse/neglect since last visito No Secondary Verification Process Completed: Yes Hospitalized since last visit: No Patient Requires Transmission-Based No Implantable device outside of the clinic excluding No Precautions: cellular tissue based products placed in the center Patient Has Alerts: Yes since last visit: Patient Alerts: 10/23 ABI L0.94 R1 VVS Pain Present Now: No 10/23 TBI L1.2 R0.88 VVS Electronic Signature(s) Signed: 07/09/2023 8:22:00 AM By: Dayton Scrape Entered By: Dayton Scrape on 07/09/2023 08:11:13 -------------------------------------------------------------------------------- Encounter Discharge Information Details Patient Name: Date of Service: [name] [name], [name] 07/09/2023 8:00 A M Medical Record Number: [id number] Patient Account Number: [id number] Date of Birth/Sex: Treating RN: Dec 11, 1955 (67 y.o. [name], [name] Primary Care Vang Kraeger: [place] Mon Other Clinician: Referring Rashawnda Gaba: Treating Chasty Randal/Extender: [name] in Treatment: 28 Encounter Discharge Information Items Post  Procedure Vitals Discharge Condition: Stable Temperature (F): 98.7 Ambulatory Status: Ambulatory Pulse (bpm): 74 Discharge Destination: Home Respiratory Rate (breaths/min): 17 Transportation: Private Auto Blood Pressure (mmHg): 120/70 Accompanied By: wife Schedule Follow-up Appointment: Yes Clinical Summary of Care: Patient Declined Electronic Signature(s) Signed: 07/09/2023 3:58:43 PM By: Fonnie Mu RN Entered By: Fonnie Mu on 07/09/2023 15:58:41 Cay Schillings ([id number]) [id number] Page 2 of 10 -------------------------------------------------------------------------------- Lower Extremity Assessment Details Patient Name: Date of Service: [name] Sieving. 07/09/2023 8:00 A M Medical Record Number: [id number] Patient Account Number: [id number] Date of Birth/Sex: Treating RN: [birth year] (67 y.o. [name], [name] Primary Care Natasha Paulson: [place] Mon Other Clinician: Referring Mickeal Daws: Treating Ruthvik Barnaby/Extender: [name] in Treatment: 28 Edema Assessment Assessed: [name]: No] [name]: No] Edema: [Left: No] [Right: No] Calf Left: Right: Point of Measurement: 37 cm From Medial Instep 33 cm 33 cm Ankle Left: Right: Point of Measurement: 13 cm From Medial Instep 22 cm 22 cm Vascular Assessment Extremity colors, hair growth, and conditions: Extremity Color: [Left:Hyperpigmented] [Right:Hyperpigmented] Hair Growth on Extremity: [Left:No] [Right:No] Temperature of Extremity: [Left:Warm] [Right:Warm] Capillary Refill: [Left:< 3 seconds] [Right:< 3 seconds] Dependent Rubor: [Left:No Yes] [Right:No Yes] Electronic Signature(s) Signed: 07/09/2023 3:53:45 PM By: Fonnie Mu RN Entered By: Fonnie Mu on 07/09/2023 15:53:40 -------------------------------------------------------------------------------- Multi Wound Chart Details Patient Name: Date of Service: [name] [name] Garnet, [name] 07/09/2023 8:00 A  M Medical Record Number: [id number] Patient Account Number: [id number] Date of Birth/Sex: Treating RN: [birth year] (67 y.o. M) Primary Care Seven Dollens: Cranford Mon Other Clinician: Referring Laura-Lee Villegas: Treating Caryssa Elzey/Extender: [name] in Treatment: 28 Vital Signs Height(in): 72 Capillary Blood Glucose(mg/dl): 95 Weight(lbs): 416 Pulse(bpm): 58 Body Mass Index(BMI): 33.2 Blood Pressure(mmHg): 139/71 Temperature(F): 98.1 Respiratory Rate(breaths/min): 18 [3:Photos:] [phone].pdf Page 3 of 10] Right, Proximal, Anterior Lower Leg Left, Distal, Medial Lower Leg Right, Distal, Lateral Lower Leg Wound Location: Gradually Appeared Gradually Appeared Blister Wounding Event: Diabetic  Wound/Ulcer of the Lower Diabetic Wound/Ulcer of the Lower Diabetic Wound/Ulcer of the Lower Primary Etiology: Extremity Extremity Extremity N/A Venous Leg Ulcer N/A Secondary Etiology: Anemia, Lymphedema, Hypertension, Anemia, Lymphedema, Hypertension, Anemia, Lymphedema, Hypertension, Comorbid History: Cirrhosis , Type II Diabetes Cirrhosis , Type II Diabetes Cirrhosis , Type II Diabetes 01/25/2023 02/11/2023 02/19/2023 Date Acquired: 22 21 20  Weeks of Treatment: Open Open Open Wound Status: No No No Wound Recurrence: No No Yes Clustered Wound: 3 N/A N/A Clustered Quantity: 0.1x0.1x0.1 0.3x0.3x0.2 0.5x0.5x0.2 Measurements L x W x D (cm) 0.008 0.071 0.196 A (cm) : rea 0.001 0.014 0.039 Volume (cm) : 88.70% 81.60% 69.20% % Reduction in A rea: 92.90% 63.20% 39.10% % Reduction in Volume: Grade 1 Grade 1 Grade 1 Classification: Medium Medium Medium Exudate A mount: Serosanguineous Serosanguineous Serosanguineous Exudate Type: red, brown red, brown red, brown Exudate Color: Distinct, outline attached Distinct, outline attached Distinct, outline attached Wound Margin: Large (67-100%) Small (1-33%) Large (67-100%) Granulation A mount: Red,  Pink Pink Red, Pink Granulation Quality: Small (1-33%) Large (67-100%) Small (1-33%) Necrotic A mount: Fat Layer (Subcutaneous Tissue): Yes Fat Layer (Subcutaneous Tissue): Yes Fat Layer (Subcutaneous Tissue): Yes Exposed Structures: Fascia: No Fascia: No Fascia: No Tendon: No Tendon: No Tendon: No Muscle: No Muscle: No Muscle: No Joint: No Joint: No Joint: No Bone: No Bone: No Bone: No Large (67-100%) Small (1-33%) Large (67-100%) Epithelialization: Excoriation: No Excoriation: No Excoriation: No Periwound Skin Texture: Induration: No Induration: No Induration: No Callus: No Callus: No Callus: No Crepitus: No Crepitus: No Crepitus: No Rash: No Rash: No Rash: No Scarring: No Scarring: No Scarring: No Maceration: No Maceration: No Maceration: No Periwound Skin Moisture: Dry/Scaly: No Dry/Scaly: No Dry/Scaly: No Atrophie Blanche: No Atrophie Blanche: No Atrophie Blanche: No Periwound Skin Color: Cyanosis: No Cyanosis: No Cyanosis: No Ecchymosis: No Ecchymosis: No Ecchymosis: No Erythema: No Erythema: No Erythema: No Hemosiderin Staining: No Hemosiderin Staining: No Hemosiderin Staining: No Mottled: No Mottled: No Mottled: No Pallor: No Pallor: No Pallor: No Rubor: No Rubor: No Rubor: No No Abnormality No Abnormality N/A Temperature: Treatment Notes Electronic Signature(s) Signed: 07/14/2023 9:47:40 AM By: [name] MD Entered By: [name] on 07/09/2023 08:32:04 -------------------------------------------------------------------------------- Multi-Disciplinary Care Plan Details Patient Name: Date of Service: [name] [name] Garnet, [name] 07/09/2023 8:00 A M Medical Record Number: [id number] Patient Account Number: [id number] Date of Birth/Sex: Treating RN: [birth year] (67 y.o. [name], [name] Primary Care Ashima Shrake: [place] Mon Other Clinician: Referring Shaundra Fullam: Treating Philippe Gang/Extender: [name]  in Treatment: [address] [place] Judie Petit ([id number]) [id number] Page 4 of 10 Pain, Acute or Chronic Nursing Diagnoses: Pain, acute or chronic: actual or potential Potential alteration in comfort, pain Goals: Patient will verbalize adequate pain control and receive pain control interventions during procedures as needed Date Initiated: 12/24/2022 Target Resolution Date: 08/02/2023 Goal Status: Active Patient/caregiver will verbalize comfort level met Date Initiated: 12/24/2022 Date Inactivated: 01/31/2023 Target Resolution Date: 02/14/2023 Goal Status: Met Interventions: Encourage patient to take pain medications as prescribed Provide education on pain management Treatment Activities: Administer pain control measures as ordered : 12/24/2022 Notes: Electronic Signature(s) Signed: 07/09/2023 3:57:14 PM By: Fonnie Mu RN Entered By: Fonnie Mu on 07/09/2023 15:57:11 -------------------------------------------------------------------------------- Pain Assessment Details Patient Name: Date of Service: [name] [name] RRIS M. 07/09/2023 8:00 A M Medical Record Number: [id number] Patient Account Number: [id number] Date of Birth/Sex: Treating RN: 04-30-1956 (67 y.o. M) Primary Care Blair Mesina: Cranford Mon Other Clinician: Referring Caralynn Gelber: Treating Dynastie Knoop/Extender: Dwyane Dee,  Kingsley Callander Weeks in Treatment: 28 Active Problems Location of Pain Severity and Description of Pain Patient Has Paino No Site Locations Pain Management and Medication Current Pain Management: Electronic Signature(s) Signed: 07/09/2023 8:22:00 AM By: Elsworth Soho, Milus Height ([id number]) [id number] Page 5 of 10 Entered By: Dayton Scrape on 07/09/2023 08:12:05 -------------------------------------------------------------------------------- Patient/Caregiver Education Details Patient Name: Date of Service: [name] [name] [name].  12/3/2024andnbsp8:00 A M Medical Record Number: [id number] Patient Account Number: [id number] Date of Birth/Gender: Treating RN: February 18, 1956 (67 y.o. [name] Primary Care Physician: [place] Mon Other Clinician: Referring Physician: Treating Physician/Extender: [name] in Treatment: 28 Education Assessment Education Provided To: Patient Education Topics Provided Wound/Skin Impairment: Methods: Explain/Verbal Responses: Reinforcements needed, State content correctly [name]) Signed: 07/10/2023 4:12:43 PM By: Fonnie Mu RN Entered By: Fonnie Mu on 07/09/2023 15:57:29 -------------------------------------------------------------------------------- Wound Assessment Details Patient Name: Date of Service: [name] [name] Garnet, [name] 07/09/2023 8:00 A M Medical Record Number: [id number] Patient Account Number: [id number] Date of Birth/Sex: Treating RN: 1955/10/16 (67 y.o. [name], [name] Primary Care Antar Milks: [place] Mon Other Clinician: Referring Vale Mousseau: Treating Jerrion Tabbert/Extender: [name] in Treatment: 28 Wound Status Wound Number: 3 Primary Diabetic Wound/Ulcer of the Lower Extremity Etiology: Wound Location: Right, Proximal, Anterior Lower Leg Wound Status: Healed - Epithelialized Wounding Event: Gradually Appeared Comorbid Anemia, Lymphedema, Hypertension, Cirrhosis , Type II Date Acquired: 01/25/2023 History: Diabetes Weeks Of Treatment: 22 Clustered Wound: No Photos TRAMONE, BENTZEL ([id number]) [phone].pdf Page 6 of 10 Wound Measurements Length: (cm) Width: (cm) Depth: (cm) Clustered Quantity: Area: (cm) Volume: (cm) 0 % Reduction in Area: 100% 0 % Reduction in Volume: 100% 0 Epithelialization: Large (67-100%) 3 0 0 Wound Description Classification: Grade 1 Wound Margin: Distinct, outline attached Exudate Amount: Medium Exudate Type:  Serosanguineous Exudate Color: red, brown Foul Odor After Cleansing: No Slough/Fibrino Yes Wound Bed Granulation Amount: Large (67-100%) Exposed Structure Granulation Quality: Red, Pink Fascia Exposed: No Necrotic Amount: Small (1-33%) Fat Layer (Subcutaneous Tissue) Exposed: Yes Tendon Exposed: No Muscle Exposed: No Joint Exposed: No Bone Exposed: No Periwound Skin Texture Texture Color No Abnormalities Noted: No No Abnormalities Noted: No Callus: No Atrophie Blanche: No Crepitus: No Cyanosis: No Excoriation: No Ecchymosis: No Induration: No Erythema: No Rash: No Hemosiderin Staining: No Scarring: No Mottled: No Pallor: No Moisture Rubor: No No Abnormalities Noted: No Dry / Scaly: No Temperature / Pain Maceration: No Temperature: No Abnormality Treatment Notes Wound #3 (Lower Leg) Wound Laterality: Right, Anterior, Proximal Cleanser Peri-Wound Care Topical Primary Dressing Secondary Dressing Secured With Compression Wrap Compression Stockings Add-Ons Electronic Signature(s) Signed: 07/10/2023 4:12:43 PM By: Fonnie Mu RN Previous Signature: 07/09/2023 8:22:00 AM Version By: Dayton Scrape Entered By: Fonnie Mu on 07/09/2023 15:54:43 -------------------------------------------------------------------------------- Wound Assessment Details Patient Name: Date of Service: [name] [name], [name] 07/09/2023 8:00 A M Medical Record Number: [id number] Patient Account Number: [id number] KYLLIAN, ERRANTE ([id number]) [id number] Page 7 of 10 Date of Birth/Sex: Treating RN: 08-10-1955 (67 y.o. M) Primary Care Kaspian Muccio: Other Clinician: [place] Mon Referring Haizel Gatchell: Treating Allisson Schindel/Extender: [name] in Treatment: 28 Wound Status Wound Number: 4 Primary Etiology: Diabetic Wound/Ulcer of the Lower Extremity Wound Location: Left, Distal, Medial Lower Leg Secondary Venous Leg Ulcer Etiology: Wounding  Event: Gradually Appeared Wound Status: Open Date Acquired: 02/11/2023 Comorbid Anemia, Lymphedema, Hypertension, Cirrhosis , Type II Weeks Of Treatment: 21 History: Diabetes Clustered Wound: No Photos Wound Measurements Length: (cm) 0.3 Width: (  cm) 0.3 Depth: (cm) 0.2 Area: (cm) 0.071 Volume: (cm) 0.014 % Reduction in Area: 81.6% % Reduction in Volume: 63.2% Epithelialization: Small (1-33%) Wound Description Classification: Grade 1 Wound Margin: Distinct, outline attached Exudate Amount: Medium Exudate Type: Serosanguineous Exudate Color: red, brown Foul Odor After Cleansing: No Slough/Fibrino Yes Wound Bed Granulation Amount: Small (1-33%) Exposed Structure Granulation Quality: Pink Fascia Exposed: No Necrotic Amount: Large (67-100%) Fat Layer (Subcutaneous Tissue) Exposed: Yes Necrotic Quality: Adherent Slough Tendon Exposed: No Muscle Exposed: No Joint Exposed: No Bone Exposed: No Periwound Skin Texture Texture Color No Abnormalities Noted: No No Abnormalities Noted: No Callus: No Atrophie Blanche: No Crepitus: No Cyanosis: No Excoriation: No Ecchymosis: No Induration: No Erythema: No Rash: No Hemosiderin Staining: No Scarring: No Mottled: No Pallor: No Moisture Rubor: No No Abnormalities Noted: No Dry / Scaly: No Temperature / Pain Maceration: No Temperature: No Abnormality Treatment Notes Wound #4 (Lower Leg) Wound Laterality: Left, Medial, Distal Cleanser Soap and Water Discharge Instruction: May shower and wash wound with dial antibacterial soap and water prior to dressing change. Peri-Wound Care PATE, NOBLE A ([id number]) [id number] Page 8 of 10 Triamcinolone 15 (g) Discharge Instruction: Use triamcinolone apply to entire both legs. around the wounds. Topical Triamcinolone Discharge Instruction: Apply Triamcinolone as directed Primary Dressing Endoform 2x2 in Discharge Instruction: Moisten with saline or [place]  Jelly Secondary Dressing Woven Gauze Sponge, Non-Sterile 4x4 in Discharge Instruction: Apply over primary dressing as directed. Secured With [employer], 4.5x3.1 (in/yd) Discharge Instruction: Secure with Kerlix as directed. 34M Medipore H Soft Cloth Surgical T ape, 4 x 10 (in/yd) Discharge Instruction: Secure with tape as directed. Compression Wrap Compression Stockings Add-Ons Electronic Signature(s) Signed: 07/09/2023 8:22:00 AM By: Dayton Scrape Entered By: Dayton Scrape on 07/09/2023 08:14:22 -------------------------------------------------------------------------------- Wound Assessment Details Patient Name: Date of Service: [name] 84, [name] 07/09/2023 8:00 A M Medical Record Number: [id number] Patient Account Number: [id number] Date of Birth/Sex: Treating RN: 04/22/1956 (67 y.o. [name], [name] Primary Care Sadi Arave: [place] Mon Other Clinician: Referring Rolinda Impson: Treating Lulubelle Simcoe/Extender: [name] in Treatment: 28 Wound Status Wound Number: 5 Primary Diabetic Wound/Ulcer of the Lower Extremity Etiology: Wound Location: Right, Distal, Lateral Lower Leg Wound Status: Healed - Epithelialized Wounding Event: Blister Comorbid Anemia, Lymphedema, Hypertension, Cirrhosis , Type II Date Acquired: 02/19/2023 History: Diabetes Weeks Of Treatment: 20 Clustered Wound: Yes Photos Wound Measurements Length: (cm) Width: (cm) Depth: (cm) AMORE, SUKHRAM ([id number]) Area: (cm) Volume: (cm) 0 % Reduction in Area: 100% 0 % Reduction in Volume: 100% 0 Epithelialization: Large (67-100%) [id number] Page 9 of 10 0 0 Wound Description Classification: Grade 1 Wound Margin: Distinct, outline attached Exudate Amount: Medium Exudate Type: Serosanguineous Exudate Color: red, brown Foul Odor After Cleansing: No Slough/Fibrino No Wound Bed Granulation Amount: Large (67-100%) Exposed Structure Granulation Quality:  Red, Pink Fascia Exposed: No Necrotic Amount: Small (1-33%) Fat Layer (Subcutaneous Tissue) Exposed: Yes Tendon Exposed: No Muscle Exposed: No Joint Exposed: No Bone Exposed: No Periwound Skin Texture Texture Color No Abnormalities Noted: No No Abnormalities Noted: No Callus: No Atrophie Blanche: No Crepitus: No Cyanosis: No Excoriation: No Ecchymosis: No Induration: No Erythema: No Rash: No Hemosiderin Staining: No Scarring: No Mottled: No Pallor: No Moisture Rubor: No No Abnormalities Noted: No Dry / Scaly: No Maceration: No Treatment Notes Wound #5 (Lower Leg) Wound Laterality: Right, Lateral, Distal Cleanser Peri-Wound Care Topical Primary Dressing Secondary Dressing Secured With Compression Wrap Compression Stockings Add-Ons Electronic Signature(s) Signed: 07/10/2023 4:12:43 PM By: Bryon Lions,  [name] RN Previous Signature: 07/09/2023 8:22:00 AM Version By: Dayton Scrape Entered By: Fonnie Mu on 07/09/2023 15:54:26 -------------------------------------------------------------------------------- Vitals Details Patient Name: Date of Service: [name] [name], [name] 07/09/2023 8:00 A M Medical Record Number: [id number] Patient Account Number: [id number] Date of Birth/Sex: Treating RN: [birth year] (67 y.o. M) Primary Care Vitor Overbaugh: [place] Mon Other Clinician: Referring Kalon Erhardt: Treating Oluwatomisin Hustead/Extender: [name] in Treatment: [address], [place] ([id number]) [id number] Page 10 of 10 Vital Signs Time Taken: 07:57 Temperature (F): 98.1 Height (in): 72 Pulse (bpm): 58 Weight (lbs): 245 Respiratory Rate (breaths/min): 18 Body Mass Index (BMI): 33.2 Blood Pressure (mmHg): 139/71 Capillary Blood Glucose (mg/dl): 95 Reference Range: 80 - 120 mg / dl Electronic Signature(s) Signed: 07/09/2023 8:22:00 AM By: Dayton Scrape Entered By: Dayton Scrape on 07/09/2023 08:11:59

## 2023-07-14 NOTE — Progress Notes (Addendum)
THANOS, CAPAN ([id number]) [id number] Page 1 of 8 Visit Report for 07/09/2023 Debridement Details Patient Name: Date of Service: [name] RRIS M. 07/09/2023 8:00 A M Medical Record Number: [id number] Patient Account Number: [id number] Date of Birth/Sex: Treating RN: 04/26/1956 (67 y.o. [name] [name], [name] [name] Primary Care Provider: Cranford Mon Other Clinician: Referring Provider: Treating Provider/Extender: [name] in Treatment: 28 Debridement Performed for Assessment: Wound #4 Left,Distal,Medial Lower Leg Performed By: Physician Maxwell Caul., MD The following information was scribed by: Fonnie Mu The information was scribed for: [name] Debridement Type: Debridement Severity of Tissue Pre Debridement: Fat layer exposed Level of Consciousness (Pre-procedure): Awake and Alert Pre-procedure Verification/Time Out Yes - 08:26 Taken: Start Time: 08:26 Pain Control: Lidocaine Percent of Wound Bed Debrided: 100% T Area Debrided (cm): otal 0.07 Tissue and other material debrided: Viable, Non-Viable, Slough, Slough Level: Non-Viable Tissue Debridement Description: Selective/Open Wound Instrument: Curette Bleeding: Minimum Hemostasis Achieved: Pressure End Time: 08:26 Procedural Pain: 0 Post Procedural Pain: 0 Response to Treatment: Procedure was tolerated well Level of Consciousness (Post- Awake and Alert procedure): Post Debridement Measurements of Total Wound Length: (cm) 0.3 Width: (cm) 0.3 Depth: (cm) 0.2 Volume: (cm) 0.014 Character of Wound/Ulcer Post Debridement: Improved Severity of Tissue Post Debridement: Fat layer exposed Post Procedure Diagnosis Same as Pre-procedure Electronic Signature(s) Signed: 07/09/2023 3:55:48 PM By: Fonnie Mu RN Signed: 07/14/2023 9:47:40 AM By: [name] MD Entered By: Fonnie Mu on 07/09/2023  12:55:45 -------------------------------------------------------------------------------- HPI Details Patient Name: Date of Service: [name], [name] 07/09/2023 8:00 A M Medical Record Number: [id number] Patient Account Number: [id number] Date of Birth/Sex: Treating RN: [birth year] (67 y.o. M) Primary Care Provider: Cranford Mon Other Clinician: Referring Provider: Treating Provider/Extender: Hamdan, Banter, [place] ([id number]) [id number] Page 2 of 8 Weeks in Treatment: 28 History of Present Illness HPI Description: 12/24/2022 [name] is a 67 year old male with a past medical history of controlled type 2 diabetes on insulin, lymphedema/chronic venous insufficiency and cirrhosis that presents to the clinic for a 1 year history of nonhealing wounds to his lower extremities bilaterally. He reports the starting spontaneously. He states that he has been following with [facility] and they have been using [facility] antibiotic spray to the legs. He is not wearing compression stockings or wraps. He is also tried Medihoney and collagen in the past with little benefit. He currently denies systemic signs of infection. 5/30; patient presents for follow-up. He has been using triamcinolone cream to the periwound and Santyl to the wound beds. There has been improvement in wound healing. He denies signs of infection and has no issues or complaints today. 6/4; both legs look considerably better. He has a wound on the right lateral lower leg and the left medial lower leg. Significant hemosiderin in the right leg less so on the left. We have been using Hydrofera Blue under compression 6/13; patient presents for follow-up. We have been using antibiotic ointment with Hydrofera Blue under compression therapy. The wounds are smaller. He has no issues or complaints today. We discussed ordering juxta lite compression garment wraps T use once his  wounds heal as he has hard time putting on o compression stockings. He was agreeable with this. 6/20; patient presents for follow-up. We have been using antibiotic ointment with Hydrofera Blue under compression therapy to the lower extremities bilaterally. Wounds are smaller. He has been approved for PuraPly and was agreeable with placement today. He  denies signs of infection. 6/27; patient presents for follow-up. We have been using antibiotic ointment with Hydrofera Blue under compression therapy to the left lower extremity. This wound is healed. He has his juxta lite compression wraps with him today. T the right lateral leg we have been using PuraPly under compression and that wound o is smaller today. Unfortunately he has developed skin breakdown from the Steri-Strip that was used to hold the PuraPly in place and this has created a new wound on the right leg. 7/8; patient presents for follow-up. We have been using PuraPly to the right lateral leg wound Under compression wrap. Wound is slightly smaller however original wound used for PuraPly is healed. He unfortunately developed a new wound to the left lower extremity but it looks like this was from potential scratching. He has been using his juxta lite compression here. 716; patient presents for follow-up. We have been using antibiotic ointment with Hydrofera Blue under 3 layer compression to lower extremities bilaterally. Unfortunately he has continued to develop wounds to the left lower extremity. He currently denies signs of infection. 7/22; patient presents for follow-up. He has been using Santyl and Hydrofera Blue with triamcinolone cream under her juxta lite compression wraps daily. No new wounds today. Current wounds are stable. Less irritation to the periwound. 7/29; patient presents for follow-up. He has been using Santyl and Hydrofera Blue with triamcinolone cream under the juxta lite compression wraps. He is having a hard time keeping  the [facility] in place to the wound beds. He denies signs of infection. 8/5; patient presents for follow-up. Has been using Santyl to the wound beds and triamcinolone cream under the juxta lite compression. He has no issues or complaints today. Wounds are overall stable. 8/12; patient presents for follow-up. He has been using Santyl to the wound beds and triamcinolone cream to the periwound under JuxtaLite compression. He is scheduled to see dermatology this week. Wounds are overall stable. 8/19; patient presents for follow-up. He has been using Santyl and PolyMem to the wound beds along with triamcinolone cream to the periwound under his juxta light compression. He saw dermatology and they obtained a biopsy of the distal medial wound on the left leg. He also had venous reflux studies last week that showed no venous reflux on the left and only venous reflux to the CFV with no venous reflux throughout the rest of the venous system. 9/3;Patient presents for follow-up. Patient has been using PolyMem silver with Santyl. Patient was seen by dermatology and they did a punch biopsy. 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 Patient was prescribed silver Silvadene. He has not started this yet. Overall wounds appear well-healing. 9/9; patient presents for follow-up. We have been using PolyMem silver with Santyl. Wounds are smaller. He has been using his compression Velcro wrap daily. He declines debridement today. 9/23; this is a patient who clearly has chronic venous insufficiency. For about the last 18 months he has developed recurrent small nodules on his bilateral lower legs these are painful initially seemed to have a white substance on the top they then break open and eventually will heal over. These occurred even while he was under compression. 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 The patient has been  using Santyl and an polymen Ag. He has been using his own compression stockings. 9/30; everything is just about healed on the left leg. Still has  2 small superficial areas on the left. On the right 2 areas with depth I wonder whether these were necrobiosis lipoidica lesions that the dermatologist diagnosed by biopsying one of the areas on the left. I reviewed the note after they left the clinic last week. 10/7; still with the 2 spots which are small wounds on the right anterior and right lateral lower leg. These have very adherent slough. He also has a small painful area on the left posterior Achilles and the left posterior lower leg. The biopsy apparently showed necrobiosis lipoidica. I think this is the cause of the recurrent wounds over the last 18 months. 10/14; the areas on the left are smaller. This includes the biopsy site on the left Achilles area. The areas on the right are about the same this is the right lateral and right anterior lower leg. I debrided both of these last week he did not want to be debrided today. As it turns out he has 1 pound jar of triamcinolone 0.1% I not sure what that was being used for. Some changes of chronic stasis dermatitis perhaps when he first came in according to our intake nurse. 10/22; again no change in most of these areas. He does not want debridements. We have been using triamcinolone Santyl and Hydrofera Blue but very little change. 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 We have 2 areas on the right and 2 areas on the left 1 of which was the biopsy site 10/29; some improvement. The superior wounds on both sides have dried up eschar on the surface he has not been allowing debridement but these look better. The deeper punched-out area on the right also appears to have a better surface. The biopsy site again eschared over but looks as though it is contracting. I changed to Iodosorb last week with TCA. Multiple prior wounds  likely necrobiosis although he does have venous insufficiency 11/12; 2-week follow-up. The patient has 2 open wounds on the right 1 on the lower anterior and 1 on the lower medial leg. He has a small open area on the left medial lower leg. I have looked through his previous records. He has biopsy-proven necrobiosis lipoidica and atrophie blanche. The wounds are small and punched-out look more like the remanence of necrobiosis then venous wounds. Nevertheless his wife shows me pictures of his legs where he currently has chronic venous insufficiency with stasis dermatitis. 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 We have improved the condition of the chronic CAYNAN, BROSIUS ([id number]) [phone].pdf Page 3 of 8 venous insufficiency and stasis with compression. With regards to the 3 open areas 2 on the right and 1 on the left lower medial leg all of these look somewhat better although there is still some depth. At the 2 on the right have a clean base no debridement is required, on the left the wound still has some depth although the base of the wound looks better with the opening almost too small to allow debridement. 12/3; both areas on the right leg are healed this week [necrobiosis lipoidica]. His edema is well-controlled on the right. He has a JuxtaLite stocking. He still has 1 area on the medial left ankle which was his original biopsy site presumably necrobiosis lipoidica as well Electronic Signature(s) Signed: 07/14/2023 9:47:40 AM By: [name] MD Entered By: [name]  on 07/09/2023 05:33:12 -------------------------------------------------------------------------------- Physical Exam Details Patient Name: Date of Service: [name] [name]. 07/09/2023 8:00 A M Medical Record Number:  [id number] Patient Account Number: [id number] Date of Birth/Sex: Treating RN: [birth year] (67 y.o. M) Primary Care Provider: Cranford Mon Other Clinician: Referring Provider: Treating Provider/Extender: [name] in Treatment: 28 Constitutional Sitting or standing Blood Pressure is within target range for patient.. Pulse regular and within target range for patient.Marland Kitchen Respirations regular, non-labored and within target range.. Temperature is normal and within the target range for the patient.Marland Kitchen Appears in no distress. Notes Wound exam; the patient has no open wound on the right leg. The 2 small areas that were present on the right anterior and medially are now healed. He still has 1 on the left leg and the left medial lower leg/ankle. This was an original biopsy site done by dermatology. His edema is controlled quite well bilaterally Electronic Signature(s) Signed: 07/14/2023 9:47:40 AM By: [name] MD Entered By: [name] on 07/09/2023 05:34:30 -------------------------------------------------------------------------------- Physician Orders Details Patient Name: Date of Service: [name], [name] 07/09/2023 8:00 A M Medical Record Number: [id number] Patient Account Number: [id number] Date of Birth/Sex: Treating RN: 1956/05/31 (67 y.o. [name] [name], [name] [name] Primary Care Provider: Cranford Mon Other Clinician: Referring Provider: Treating Provider/Extender: [name] in Treatment: 75 Verbal / Phone Orders: No Diagnosis Coding ICD-10 Coding Code Description I87.313 Chronic venous hypertension (idiopathic) with ulcer of bilateral lower extremity L97.818 Non-pressure chronic ulcer of other part of right lower leg with other specified severity L97.828 Non-pressure chronic ulcer of other part of left lower leg with other specified severity I89.0 Lymphedema, not elsewhere classified E11.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 other skin  ulcer Follow-up Appointments Return Appointment in 2 weeks. Anesthetic (In clinic) Topical Lidocaine 4% applied to wound bed [name] [name], [name] [name] ([id number]) [phone].pdf Page 4 of 8 Bathing/ Shower/ Hygiene May shower and wash wound with soap and water. Wound Treatment Wound #4 - Lower Leg Wound Laterality: Left, Medial, Distal Cleanser: Soap and Water Every Other Day/30 Days Discharge Instructions: May shower and wash wound with dial antibacterial soap and water prior to dressing change. Peri-Wound Care: Triamcinolone 15 (g) Every Other Day/30 Days Discharge Instructions: Use triamcinolone apply to entire both legs. around the wounds. Topical: Triamcinolone Every Other Day/30 Days Discharge Instructions: Apply Triamcinolone as directed Prim Dressing: Endoform 2x2 in Every Other Day/30 Days ary Discharge Instructions: Moisten with saline or [place] Jelly Secondary Dressing: Woven Gauze Sponge, Non-Sterile 4x4 in (Generic) Every Other Day/30 Days Discharge Instructions: Apply over primary dressing as directed. Secured With: [employer], 4.5x3.1 (in/yd) (Generic) Every Other Day/30 Days Discharge Instructions: Secure with Kerlix as directed. Secured With: 45M Medipore H Soft Cloth Surgical T ape, 4 x 10 (in/yd) (Generic) Every Other Day/30 Days Discharge Instructions: Secure with tape as directed. Electronic Signature(s) Signed: 07/09/2023 3:56:59 PM By: Fonnie Mu RN Signed: 07/14/2023 9:47:40 AM By: [name] MD Entered By: Fonnie Mu on 07/09/2023 12:56:47 -------------------------------------------------------------------------------- Problem List Details Patient Name: Date of Service: [name], [name] 07/09/2023 8:00 A M Medical Record Number: [id number] Patient Account Number: [id number] Date of Birth/Sex: Treating RN: 11-22-1955 (67 y.o. M) Primary Care Provider: Cranford Mon Other Clinician: Referring Provider: Treating  Provider/Extender: [name] in Treatment: 28 Active Problems ICD-10 Encounter Code Description Active Date MDM Diagnosis I87.313 Chronic venous hypertension (idiopathic) with ulcer of bilateral lower extremity 12/24/2022 No  Yes L97.818 Non-pressure chronic ulcer of other part of right lower leg with other specified 12/24/2022 No Yes severity L97.828 Non-pressure chronic ulcer of other part of left lower leg with other specified 12/24/2022 No Yes severity I89.0 Lymphedema, not elsewhere classified 12/24/2022 No Yes E11.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 other skin ulcer 12/24/2022 No Yes [name] [name], [name] [name] ([id number]) [phone].pdf Page 5 of 8 Inactive Problems ICD-10 Code Description Active Date Inactive Date 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 07/09/2023 8:00 A M Medical Record Number: [id number] Patient Account Number: [id number] Date of Birth/Sex: Treating RN: 09/09/1955 (67 y.o. M) Primary Care Provider: Cranford Mon Other Clinician: Referring Provider: Treating Provider/Extender: [name] in Treatment: 28 Subjective History of Present Illness (HPI) 12/24/2022 Mr. Terric Korte is a 67 year old male with a past medical history of controlled type 2 diabetes on insulin, lymphedema/chronic venous insufficiency and cirrhosis that presents to the clinic for a 1 year history of nonhealing wounds to his lower extremities bilaterally. He reports the starting spontaneously. He states that he has been following with [facility] and they have been using  [facility] antibiotic spray to the legs. He is not wearing compression stockings or wraps. He is also tried Medihoney and collagen in the past with little benefit. He currently denies systemic signs of infection. 5/30; patient presents for follow-up. He has been using triamcinolone cream to the periwound and Santyl to the wound beds. There has been improvement in wound healing. He denies signs of infection and has no issues or complaints today. 6/4; both legs look considerably better. He has a wound on the right lateral lower leg and the left medial lower leg. Significant hemosiderin in the right leg less so on the left. We have been using Hydrofera Blue under compression 6/13; patient presents for follow-up. We have been using antibiotic ointment with Hydrofera Blue under compression therapy. The wounds are smaller. He has no issues or complaints today. We discussed ordering juxta lite compression garment wraps T use once his wounds heal as he has hard time putting on o compression stockings. He was agreeable with this. 6/20; patient presents for follow-up. We have been using antibiotic ointment with Hydrofera Blue under compression therapy to the lower extremities bilaterally. Wounds are smaller. He has been approved for PuraPly and was agreeable with placement today. He denies signs of infection. 6/27; patient presents for follow-up. We have been using antibiotic ointment with Hydrofera Blue under compression therapy to the left lower extremity. This wound is healed. He has his juxta lite compression wraps with him today. T the right lateral leg we have been using PuraPly under compression and that wound o is smaller today. Unfortunately he has developed skin breakdown from the Steri-Strip that was used to hold the PuraPly in place and this has created a new wound on the right leg. 7/8; patient presents for follow-up. We have been using PuraPly to the right lateral leg wound Under compression wrap.  Wound is slightly smaller however original wound used for PuraPly is healed. He unfortunately developed a new wound to the left lower extremity but it looks like this was from potential scratching. He has been using his juxta lite compression here. 716; patient presents for follow-up. We have been  using antibiotic ointment with Hydrofera Blue under 3 layer compression to lower extremities bilaterally. Unfortunately he has continued to develop wounds to the left lower extremity. He currently denies signs of infection. 7/22; patient presents for follow-up. He has been using Santyl and Hydrofera Blue with triamcinolone cream under her juxta lite compression wraps daily. No new wounds today. Current wounds are stable. Less irritation to the periwound. 7/29; patient presents for follow-up. He has been using Santyl and Hydrofera Blue with triamcinolone cream under the juxta lite compression wraps. He is having a hard time keeping the [facility] in place to the wound beds. He denies signs of infection. 8/5; patient presents for follow-up. Has been using Santyl to the wound beds and triamcinolone cream under the juxta lite compression. He has no issues or complaints today. Wounds are overall stable. 8/12; patient presents for follow-up. He has been using Santyl to the wound beds and triamcinolone cream to the periwound under JuxtaLite compression. He is MARC, MIARS ([id number]) [id number] Page 6 of 8 scheduled to see dermatology this week. Wounds are overall stable. 8/19; patient presents for follow-up. He has been using Santyl and PolyMem to the wound beds along with triamcinolone cream to the periwound under his juxta light compression. He saw dermatology and they obtained a biopsy of the distal medial wound on the left leg. He also had venous reflux studies last week that showed no venous reflux on the left and only venous reflux to the CFV with no venous reflux  throughout the rest of the venous system. 9/3;Patient presents for follow-up. Patient has been using PolyMem silver with Santyl. Patient was seen by dermatology and they did a punch biopsy. 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 Patient was prescribed silver Silvadene. He has not started this yet. Overall wounds appear well-healing. 9/9; patient presents for follow-up. We have been using PolyMem silver with Santyl. Wounds are smaller. He has been using his compression Velcro wrap daily. He declines debridement today. 9/23; this is a patient who clearly has chronic venous insufficiency. For about the last 18 months he has developed recurrent small nodules on his bilateral lower legs these are painful initially seemed to have a white substance on the top they then break open and eventually will heal over. These occurred even while he was under compression. 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 The patient has been using Santyl and an polymen Ag. He has been using his own compression stockings. 9/30; everything is just about healed on the left leg. Still has 2 small superficial areas on the left. On the right 2 areas with depth I wonder whether these were necrobiosis lipoidica lesions that the dermatologist diagnosed by biopsying one of the areas on the left. I reviewed the note after they left the clinic last week. 10/7; still with the 2 spots which are small wounds on the right anterior and right lateral lower leg. These have very adherent slough. He also has a small painful area on the left posterior Achilles and the left posterior lower leg. The biopsy apparently showed necrobiosis lipoidica. I think this is the cause of the recurrent wounds over the last 18 months. 10/14; the areas on the left are smaller. This includes the biopsy site on the left Achilles area. The areas on the right are about the  same this is the right lateral and right anterior lower leg. I  debrided both of these last week he did not want to be debrided today. As it turns out he has 1 pound jar of triamcinolone 0.1% I not sure what that was being used for. Some changes of chronic stasis dermatitis perhaps when he first came in according to our intake nurse. 10/22; again no change in most of these areas. He does not want debridements. We have been using triamcinolone Santyl and Hydrofera Blue but very little change. 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 We have 2 areas on the right and 2 areas on the left 1 of which was the biopsy site 10/29; some improvement. The superior wounds on both sides have dried up eschar on the surface he has not been allowing debridement but these look better. The deeper punched-out area on the right also appears to have a better surface. The biopsy site again eschared over but looks as though it is contracting. I changed to Iodosorb last week with TCA. Multiple prior wounds likely necrobiosis although he does have venous insufficiency 11/12; 2-week follow-up. The patient has 2 open wounds on the right 1 on the lower anterior and 1 on the lower medial leg. He has a small open area on the left medial lower leg. I have looked through his previous records. He has biopsy-proven necrobiosis lipoidica and atrophie blanche. The wounds are small and punched-out look more like the remanence of necrobiosis then venous wounds. Nevertheless his wife shows me pictures of his legs where he currently has chronic venous insufficiency with stasis dermatitis. 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 We have improved the condition of the chronic venous insufficiency and stasis with compression. With regards to the 3 open  areas 2 on the right and 1 on the left lower medial leg all of these look somewhat better although there is still some depth. At the 2 on the right have a clean base no debridement is required, on the left the wound still has some depth although the base of the wound looks better with the opening almost too small to allow debridement. 12/3; both areas on the right leg are healed this week [necrobiosis lipoidica]. His edema is well-controlled on the right. He has a JuxtaLite stocking. He still has 1 area on the medial left ankle which was his original biopsy site presumably necrobiosis lipoidica as well Objective Constitutional Sitting or standing Blood Pressure is within target range for patient.. Pulse regular and within target range for patient.Marland Kitchen Respirations regular, non-labored and within target range.. Temperature is normal and within the target range for the patient.Marland Kitchen Appears in no distress. Vitals Time Taken: 7:57 AM, Height: 72 in, Weight: 245 lbs, BMI: 33.2, Temperature: 98.1 F, Pulse: 58 bpm, Respiratory Rate: 18 breaths/min, Blood Pressure: 139/71 mmHg, Capillary Blood Glucose: 95 mg/dl. General Notes: Wound exam; the patient has no open wound on the right leg. The 2 small areas that were present on the right anterior and medially are now healed. He still has 1 on the left leg and the left medial lower leg/ankle. This was an original biopsy site done by dermatology. His edema is controlled quite well bilaterally Integumentary (Hair, Skin) Wound #3 status is Healed - Epithelialized. Original cause of wound was Gradually Appeared. The date acquired was: 01/25/2023. The wound has been in treatment 22 weeks. The wound is located on  the Right,Proximal,Anterior Lower Leg. The wound measures 0cm length x 0cm width x 0cm depth; 0cm^2 area and 0cm^3 volume. There is Fat Layer (Subcutaneous Tissue) exposed. There is a medium amount of serosanguineous drainage noted. The wound margin is distinct  with the outline attached to the wound base. There is large (67-100%) red, pink granulation within the wound bed. There is a small (1-33%) amount of necrotic tissue within the wound bed. The periwound skin appearance did not exhibit: Callus, Crepitus, Excoriation, Induration, Rash, Scarring, Dry/Scaly, Maceration, Atrophie Blanche, Cyanosis, Ecchymosis, Hemosiderin Staining, Mottled, Pallor, Rubor, Erythema. Periwound temperature was noted as No Abnormality. Wound #4 status is Open. Original cause of wound was Gradually Appeared. The date acquired was: 02/11/2023. The wound has been in treatment 21 weeks. The wound is located on the Left,Distal,Medial Lower Leg. The wound measures 0.3cm length x 0.3cm width x 0.2cm depth; 0.071cm^2 area and 0.014cm^3 volume. There is Fat Layer (Subcutaneous Tissue) exposed. There is a medium amount of serosanguineous drainage noted. The wound margin is distinct with the outline attached to the wound base. There is small (1-33%) pink granulation within the wound bed. There is a large (67-100%) amount of necrotic tissue within the wound bed including Adherent Slough. The periwound skin appearance did not exhibit: Callus, Crepitus, Excoriation, Induration, Rash, Scarring, Dry/Scaly, Maceration, Atrophie Blanche, Cyanosis, Ecchymosis, Hemosiderin Staining, Mottled, Pallor, Rubor, Erythema. Periwound temperature was noted as No Abnormality. Wound #5 status is Healed - Epithelialized. Original cause of wound was Blister. The date acquired was: 02/19/2023. The wound has been in treatment 20 weeks. [name] [name], [name] [name] ([id number]) [id number] Page 7 of 8 The wound is located on the Right,Distal,Lateral Lower Leg. The wound measures 0cm length x 0cm width x 0cm depth; 0cm^2 area and 0cm^3 volume. There is Fat Layer (Subcutaneous Tissue) exposed. There is a medium amount of serosanguineous drainage noted. The wound margin is distinct with the  outline attached to the wound base. There is large (67-100%) red, pink granulation within the wound bed. There is a small (1-33%) amount of necrotic tissue within the wound bed. The periwound skin appearance did not exhibit: Callus, Crepitus, Excoriation, Induration, Rash, Scarring, Dry/Scaly, Maceration, Atrophie Blanche, Cyanosis, Ecchymosis, Hemosiderin Staining, Mottled, Pallor, Rubor, Erythema. 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 There was a Selective/Open Wound Non-Viable Tissue Debridement with a total area of 0.07 sq cm performed by Maxwell Caul., MD. With the following instrument(s): Curette to remove Viable and Non-Viable tissue/material. Material removed includes [facility] after achieving pain control using Lidocaine. No specimens were taken. A time out was conducted at 08:26, prior to the start of the procedure. A Minimum amount of bleeding was controlled with Pressure. The procedure was tolerated well with a pain level of 0 throughout and a pain level of 0 following the procedure. Post Debridement Measurements: 0.3cm length x 0.3cm width x 0.2cm depth; 0.014cm^3 volume. Character of Wound/Ulcer Post Debridement is improved. Severity of Tissue Post Debridement is: Fat layer exposed. Post procedure Diagnosis Wound #4: Same as Pre-Procedure Plan Follow-up Appointments: Return Appointment  in 2 weeks. Anesthetic: (In clinic) Topical  Lidocaine 4% applied to wound bed Bathing/ Shower/ Hygiene: May shower and wash wound with soap and water. WOUND #4: - Lower Leg Wound Laterality: Left, Medial, Distal Cleanser: Soap and Water Every Other Day/30 Days Discharge Instructions: May shower and wash wound with dial antibacterial soap and water prior to dressing change. Peri-Wound Care: Triamcinolone 15 (g) Every Other Day/30 Days Discharge Instructions: Use triamcinolone apply to entire both legs. around the wounds. Topical: Triamcinolone Every Other Day/30 Days Discharge Instructions: Apply Triamcinolone as directed Prim Dressing: Endoform 2x2 in Every Other Day/30 Days ary Discharge Instructions: Moisten with saline or [place] Jelly Secondary Dressing: Woven Gauze Sponge, Non-Sterile 4x4 in (Generic) Every Other Day/30 Days Discharge Instructions: Apply over primary dressing as directed. Secured With: [employer], 4.5x3.1 (in/yd) (Generic) Every Other Day/30 Days Discharge Instructions: Secure with Kerlix as directed. Secured With: 80M Medipore H Soft Cloth Surgical T ape, 4 x 10 (in/yd) (Generic) Every Other Day/30 Days Discharge Instructions: Secure with tape as directed. 1. Continue with steroid, endoform to the remaining left lower leg wound 2. Everything is healed on the right leg, skin moisturizer, leg elevation and juxta lite stocking we went over this. 3. Follow-up 2 weeks Electronic Signature(s) Signed: 07/24/2023 5:35:56 PM By: [name] RN, BSN Signed: 07/30/2023 11:51:18 AM By: [name] MD Previous Signature: 07/14/2023 9:47:40 AM Version By: [name] MD Entered By: [name] on 07/24/2023 06:50:33 [name] [name] ([id number]) [id number] Page 8 of 8 -------------------------------------------------------------------------------- SuperBill Details Patient Name: Date of Service: [name] [name] 07/09/2023 Medical Record Number: [id number] Patient Account  Number: [id number] Date of Birth/Sex: Treating RN: 19-May-1956 (68 y.o. M) Primary Care Provider: Cranford Mon Other Clinician: Referring Provider: Treating Provider/Extender: [name] in Treatment: 28 Diagnosis Coding ICD-10 Codes Code Description [phone] Chronic venous hypertension (idiopathic) with ulcer of bilateral lower extremity L97.818 Non-pressure chronic ulcer of other part of right lower leg with other specified severity L97.828 Non-pressure chronic ulcer of other part of left lower leg with other specified severity I89.0 Lymphedema, not elsewhere classified E11.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 other skin ulcer Facility Procedures : CPT4 Code: [id number] Description: 97597 - DEBRIDE WOUND 1ST 20 SQ CM OR < ICD-10 Diagnosis Description L97.828 Non-pressure chronic ulcer of other part of left lower leg with other specified se Modifier: verity Quantity: 1 Physician Procedures : CPT4 Code Description Modifier [id number] - WC PHYS DEBR WO ANESTH 20 SQ CM ICD-10 Diagnosis Description L97.828 Non-pressure chronic ulcer of other part of left lower leg with other specified severity Quantity: 1 Electronic Signature(s) Signed: 07/09/2023 3:57:53 PM By: Fonnie Mu RN Signed: 07/14/2023 9:47:40 AM By: [name] MD Entered By: Fonnie Mu on 07/09/2023 12:57:48

## 2023-07-23 ENCOUNTER — Encounter (HOSPITAL_BASED_OUTPATIENT_CLINIC_OR_DEPARTMENT_OTHER): Payer: Medicare Other | Admitting: Internal Medicine

## 2023-07-23 DIAGNOSIS — I87313 Chronic venous hypertension (idiopathic) with ulcer of bilateral lower extremity: Secondary | ICD-10-CM | POA: Diagnosis not present

## 2023-07-25 NOTE — Progress Notes (Signed)
[name] [name], [name] [name] ([id number]) [id number] Page 1 of 8 Visit Report for 07/23/2023 Arrival Information Details Patient Name: Date of Service: [name] [name]. 07/23/2023 8:00 A M Medical Record Number: [id number] Patient Account Number: [id number] Date of Birth/Sex: Treating RN: 1956-03-13 (67 y.o. [name], [name] Primary Care [name]: [place] Mon Other Clinician: Referring [name]: Treating [name]/Extender: [name] in Treatment: 30 Visit Information History Since Last Visit Added or deleted any medications: No Patient Arrived: Ambulatory Any new allergies or adverse reactions: No Arrival Time: 08:05 Had a fall or experienced change in No Accompanied By: sister activities of daily living that may affect Transfer Assistance: None risk of falls: Patient Identification Verified: Yes Signs or symptoms of abuse/neglect since last visito No Secondary Verification Process Completed: Yes Hospitalized since last visit: No Patient Requires Transmission-Based No Implantable device outside of the clinic excluding No Precautions: cellular tissue based products placed in the center Patient Has Alerts: Yes since last visit: Patient Alerts: 10/23 ABI L0.94 R1 VVS Has Dressing in Place as Prescribed: Yes 10/23 TBI L1.2 R0.88 VVS Has Compression in Place as Prescribed: Yes Pain Present Now: No Electronic Signature(s) Signed: 07/24/2023 5:35:56 PM By: [name] RN, BSN Entered By: [name] on 07/23/2023 08:09:58 -------------------------------------------------------------------------------- Clinic Level of Care Assessment Details Patient Name: Date of Service: [name] 26, [name] 07/23/2023 8:00 A M Medical Record Number: [id number] Patient Account Number: [id number] Date of Birth/Sex: Treating RN: Jun 06, 1956 (67 y.o. [name] Primary Care [name]: [place] Mon Other Clinician: Referring Parmvir Boomer: Treating  [name]/Extender: [name] in Treatment: 30 Clinic Level of Care Assessment Items TOOL 4 Quantity Score X- 1 0 Use when only an EandM is performed on FOLLOW-UP visit ASSESSMENTS - Nursing Assessment / Reassessment X- 1 10 Reassessment of Co-morbidities (includes updates in patient status) X- 1 5 Reassessment of Adherence to Treatment Plan ASSESSMENTS - Wound and Skin A ssessment / Reassessment X - Simple Wound Assessment / Reassessment - one wound 1 5 []  - 0 Complex Wound Assessment / Reassessment - multiple wounds X- 1 10 Dermatologic / Skin Assessment (not related to wound area) ASSESSMENTS - Focused Assessment X- 1 5 Circumferential Edema Measurements - multi extremities []  - 0 Nutritional Assessment / Counseling / Intervention [name] [name], [name] [name] ([id number]) [id number] Page 2 of 8 []  - 0 Lower Extremity Assessment (monofilament, tuning fork, pulses) []  - 0 Peripheral Arterial Disease Assessment (using hand held doppler) ASSESSMENTS - Ostomy and/or Continence Assessment and Care []  - 0 Incontinence Assessment and Management []  - 0 Ostomy Care Assessment and Management (repouching, etc.) PROCESS - Coordination of Care X - Simple Patient / Family Education for ongoing care 1 15 []  - 0 Complex (extensive) Patient / Family Education for ongoing care X- 1 10 Staff obtains Chiropractor, Records, T Results / Process Orders est []  - 0 Staff telephones HHA, Nursing Homes / Clarify orders / etc []  - 0 Routine Transfer to another Facility (non-emergent condition) []  - 0 Routine Hospital Admission (non-emergent condition) []  - 0 New Admissions / Manufacturing engineer / Ordering NPWT Apligraf, etc. , []  - 0 Emergency Hospital Admission (emergent condition) X- 1 10 Simple Discharge Coordination []  - 0 Complex (extensive) Discharge Coordination PROCESS - Special Needs []  - 0 Pediatric / Minor Patient Management []  -  0 Isolation Patient Management []  - 0 Hearing / Language / Visual special needs []  - 0 Assessment of Community assistance (transportation, D/C planning, etc.) []  -  0 Additional assistance / Altered mentation []  - 0 Support Surface(s) Assessment (bed, cushion, seat, etc.) INTERVENTIONS - Wound Cleansing / Measurement X - Simple Wound Cleansing - one wound 1 5 []  - 0 Complex Wound Cleansing - multiple wounds X- 1 5 Wound Imaging (photographs - any number of wounds) []  - 0 Wound Tracing (instead of photographs) X- 1 5 Simple Wound Measurement - one wound []  - 0 Complex Wound Measurement - multiple wounds INTERVENTIONS - Wound Dressings []  - 0 Small Wound Dressing one or multiple wounds []  - 0 Medium Wound Dressing one or multiple wounds []  - 0 Large Wound Dressing one or multiple wounds []  - 0 Application of Medications - topical []  - 0 Application of Medications - injection INTERVENTIONS - Miscellaneous []  - 0 External ear exam []  - 0 Specimen Collection (cultures, biopsies, blood, body fluids, etc.) []  - 0 Specimen(s) / Culture(s) sent or taken to Lab for analysis []  - 0 Patient Transfer (multiple staff / Nurse, adult / Similar devices) []  - 0 Simple Staple / Suture removal (25 or less) []  - 0 Complex Staple / Suture removal (26 or more) []  - 0 Hypo / Hyperglycemic Management (close monitor of Blood Glucose) [name] [name], [name] [name] ([id number]) [id number] Page 3 of 8 []  - 0 Ankle / Brachial Index (ABI) - do not check if billed separately X- 1 5 Vital Signs Has the patient been seen at the hospital within the last three years: Yes Total Score: 90 Level Of Care: New/Established - Level 3 Electronic Signature(s) Signed: 07/24/2023 5:35:56 PM By: [name] RN, BSN Entered By: [name] on 07/23/2023 08:22:26 -------------------------------------------------------------------------------- Encounter Discharge Information Details Patient  Name: Date of Service: [name] [name] [name], [name] 07/23/2023 8:00 A M Medical Record Number: [id number] Patient Account Number: [id number] Date of Birth/Sex: Treating RN: 1955-09-30 (67 y.o. [name] Primary Care Naquita Nappier: [place] Mon Other Clinician: Referring Anorah Trias: Treating Veryl Winemiller/Extender: [name] in Treatment: 30 Encounter Discharge Information Items Discharge Condition: Stable Ambulatory Status: Ambulatory Discharge Destination: Home Transportation: Private Auto Accompanied By: sister Schedule Follow-up Appointment: No Clinical Summary of Care: Electronic Signature(s) Signed: 07/24/2023 5:35:56 PM By: [name] RN, BSN Entered By: [name] on 07/23/2023 08:22:52 -------------------------------------------------------------------------------- Lower Extremity Assessment Details Patient Name: Date of Service: [name] 71, [name] 07/23/2023 8:00 A M Medical Record Number: [id number] Patient Account Number: [id number] Date of Birth/Sex: Treating RN: 1956-03-17 (67 y.o. [name] Primary Care Marykathryn Carboni: [place] Mon Other Clinician: Referring Shourya Macpherson: Treating Ravon Mortellaro/Extender: [name] in Treatment: 30 Edema Assessment Assessed: [name] [name]: Yes] [name]: No] Edema: [Left: No] [Right: No] Calf Left: Right: Point of Measurement: 37 cm From Medial Instep 33 cm 33 cm Ankle Left: Right: Point of Measurement: 13 cm From Medial Instep 22 cm 22 cm Vascular Assessment Left: [[id number] Page 4 of 8Right:] Pulses: Dorsalis Pedis Palpable: [phone].pdf Page 4 of 8Yes] Extremity colors, hair growth, and conditions: Extremity Color: [phone].pdf Page 4 of 8Hyperpigmented Hyperpigmented] Hair Growth on Extremity: [phone].pdf Page 4 of 8No No] Temperature of Extremity: [phone].pdf Page  4 of 8Warm Warm] Capillary Refill: [phone].pdf Page 4 of 8< 3 seconds < 3 seconds] Dependent Rubor: [phone].pdf Page 4 of 8No No Yes Yes] Toe Nail Assessment Left: Right: Thick: Yes Discolored: Yes Deformed: Yes Improper Length and Hygiene: No Electronic Signature(s) Signed: 07/24/2023 5:35:56 PM By: [name] RN, BSN Entered By: [name] on 07/23/2023 08:12:22 -------------------------------------------------------------------------------- Multi Wound Chart Details Patient Name: Date  of Service: [name] [name] [name] RRIS M. 07/23/2023 8:00 A M Medical Record Number: [id number] Patient Account Number: [id number] Date of Birth/Sex: Treating RN: 07-Mar-1956 (67 y.o. M) Primary Care Abdi Husak: [place] Mon Other Clinician: Referring Shonia Skilling: Treating Jorja Empie/Extender: [name] in Treatment: 30 Vital Signs Height(in): 72 Pulse(bpm): 51 Weight(lbs): 245 Blood Pressure(mmHg): 126/73 Body Mass Index(BMI): 33.2 Temperature(F): 97.7 Respiratory Rate(breaths/min): 20 [4:Photos:] [N/A:N/A] Left, Distal, Medial Lower Leg N/A N/A Wound Location: Gradually Appeared N/A N/A Wounding Event: Diabetic Wound/Ulcer of the Lower N/A N/A Primary Etiology: Extremity Venous Leg Ulcer N/A N/A Secondary Etiology: Anemia, Lymphedema, Hypertension, N/A N/A Comorbid History: Cirrhosis , Type II Diabetes 02/11/2023 N/A N/A Date Acquired: 23 N/A N/A Weeks of Treatment: Healed - Epithelialized N/A N/A Wound Status: No N/A N/A Wound Recurrence: 0x0x0 N/A N/A Measurements L x W x D (cm) 0 N/A N/A A (cm) : rea 0 N/A N/A Volume (cm) : 100.00% N/A N/A % Reduction in A rea: 100.00% N/A N/A % Reduction in Volume: Grade 1 N/A N/A Classification: None Present N/A N/A Exudate A mountLUNDEN, [name] [name] ([id number]) [id number] Page 5 of 8 Distinct, outline attached N/A N/A Wound  Margin: None Present (0%) N/A N/A Granulation Amount: None Present (0%) N/A N/A Necrotic Amount: Fascia: No N/A N/A Exposed Structures: Fat Layer (Subcutaneous Tissue): No Tendon: No Muscle: No Joint: No Bone: No Large (67-100%) N/A N/A Epithelialization: Excoriation: No N/A N/A Periwound Skin Texture: Induration: No Callus: No Crepitus: No Rash: No Scarring: No Maceration: No N/A N/A Periwound Skin Moisture: Dry/Scaly: No Atrophie Blanche: No N/A N/A Periwound Skin Color: Cyanosis: No Ecchymosis: No Erythema: No Hemosiderin Staining: No Mottled: No Pallor: No Rubor: No No Abnormality N/A N/A Temperature: Treatment Notes Electronic Signature(s) Signed: 07/23/2023 4:32:13 PM By: [name] MD Entered By: [name] on 07/23/2023 08:25:33 -------------------------------------------------------------------------------- Multi-Disciplinary Care Plan Details Patient Name: Date of Service: [name] [name] [name], [name] 07/23/2023 8:00 A M Medical Record Number: [id number] Patient Account Number: [id number] Date of Birth/Sex: Treating RN: [birth year] (67 y.o. [name] Primary Care Jones Viviani: [place] Mon Other Clinician: Referring Jullian Clayson: Treating Madalynne Gutmann/Extender: [name] in Treatment: 30 Active Inactive Electronic Signature(s) Signed: 07/24/2023 5:35:56 PM By: [name] RN, BSN Entered By: [name] on 07/23/2023 08:21:54 -------------------------------------------------------------------------------- Pain Assessment Details Patient Name: Date of Service: [name] [name] [name], [name] 07/23/2023 8:00 A M Medical Record Number: [id number] Patient Account Number: [id number] Date of Birth/Sex: Treating RN: 1956-05-18 (67 y.o. [name] Primary Care Sharicka Pogorzelski: [place] Mon Other Clinician: Referring Tim Corriher: Treating Amarra Sawyer/Extender: [name] in Treatment: [address] [name] [name], [name] [name]  ([id number]) [id number] Page 6 of 8 Location of Pain Severity and Description of Pain Patient Has Paino No Site Locations Pain Management and Medication Current Pain Management: Electronic Signature(s) Signed: 07/24/2023 5:35:56 PM By: [name] RN, BSN Entered By: [name] on 07/23/2023 08:10:04 -------------------------------------------------------------------------------- Patient/Caregiver Education Details Patient Name: Date of Service: [name] [name]. 12/17/2024andnbsp8:00 A M Medical Record Number: [id number] Patient Account Number: [id number] Date of Birth/Gender: Treating RN: 10-09-1955 (67 y.o. [name] Primary Care Physician: [place] Mon Other Clinician: Referring Physician: Treating Physician/Extender: [name] in Treatment: 30 Education Assessment Education Provided To: Patient Education Topics Provided Venous: Handouts: Controlling Swelling with Compression Stockings Methods: Explain/Verbal Responses: Reinforcements needed Electronic Signature(s) Signed: 07/24/2023 5:35:56 PM By: [name] RN, BSN Entered By: [name] on 07/23/2023 08:20:01  Wound Assessment Details -------------------------------------------------------------------------------- [name] [name], [name] [name] ([id number]) [phone].pdf Page 7 of 8 Patient Name: Date of Service: [name] [name]. 07/23/2023 8:00 A M Medical Record Number: [id number] Patient Account Number: [id number] Date of Birth/Sex: Treating RN: [birth year] (67 y.o. [name], [name] Primary Care Chantia Amalfitano: [place] Mon Other Clinician: Referring Tatianna Ibbotson: Treating Brixon Zhen/Extender: [name] in Treatment: 30 Wound Status Wound Number: 4 Primary Etiology: Diabetic Wound/Ulcer of the Lower Extremity Wound Location: Left, Distal, Medial Lower Leg Secondary Venous Leg Ulcer Etiology: Wounding Event: Gradually  Appeared Wound Status: Healed - Epithelialized Date Acquired: 02/11/2023 Comorbid Anemia, Lymphedema, Hypertension, Cirrhosis , Type II Weeks Of Treatment: 23 History: Diabetes Clustered Wound: No Photos Wound Measurements Length: (cm) 0 % Reduction in Area: 100% Width: (cm) 0 % Reduction in Volume: 100% Depth: (cm) 0 Epithelialization: Large (67-100%) Area: (cm) 0 Tunneling: No Volume: (cm) 0 Undermining: No Wound Description Classification: Grade 1 Foul Odor After Cleansing: No Wound Margin: Distinct, outline attached Slough/Fibrino No Exudate Amount: None Present Wound Bed Granulation Amount: None Present (0%) Exposed Structure Necrotic Amount: None Present (0%) Fascia Exposed: No Fat Layer (Subcutaneous Tissue) Exposed: No Tendon Exposed: No Muscle Exposed: No Joint Exposed: No Bone Exposed: No Periwound Skin Texture Texture Color No Abnormalities Noted: No No Abnormalities Noted: No Callus: No Atrophie Blanche: No Crepitus: No Cyanosis: No Excoriation: No Ecchymosis: No Induration: No Erythema: No Rash: No Hemosiderin Staining: No Scarring: No Mottled: No Pallor: No Moisture Rubor: No No Abnormalities Noted: No Dry / Scaly: No Temperature / Pain Maceration: No Temperature: No Abnormality Electronic Signature(s) Signed: 07/24/2023 5:35:56 PM By: [name] RN, BSN Entered By: [name] on 07/23/2023 08:20:14 [name] [name] ([id number]) [id number] Page 8 of 8 -------------------------------------------------------------------------------- Vitals Details Patient Name: Date of Service: [name] [name]. 07/23/2023 8:00 A M Medical Record Number: [id number] Patient Account Number: [id number] Date of Birth/Sex: Treating RN: [birth year] (67 y.o. [name], [name] Primary Care Jimmie Dattilio: [place] Mon Other Clinician: Referring Raychel Dowler: Treating Maleeka Sabatino/Extender: [name] in Treatment:  30 Vital Signs Time Taken: 08:10 Temperature (F): 97.7 Height (in): 72 Pulse (bpm): 51 Weight (lbs): 245 Respiratory Rate (breaths/min): 20 Body Mass Index (BMI): 33.2 Blood Pressure (mmHg): 126/73 Reference Range: 80 - 120 mg / dl Electronic Signature(s) Signed: 07/24/2023 5:35:56 PM By: [name] RN, BSN Entered By: [name] on 07/23/2023 08:15:14

## 2023-07-25 NOTE — Progress Notes (Signed)
[name] [name] ([id number]) [id number] Page 1 of 7 Visit Report for 07/23/2023 HPI Details Patient Name: Date of Service: [name]. 07/23/2023 8:00 A [name] Medical Record Number: [id number] Patient Account Number: [id number] Date of Birth/Sex: Treating RN: March [birth year], [name] [name] (67 y.o. [name]) Primary Care Provider: Cranford [name] Other Clinician: Referring Provider: Treating Provider/Extender: [name] [name] in Treatment: 30 History of Present Illness HPI Description: 12/24/2022 [name] is a 67 year old male with a past medical history of controlled type 2 diabetes on insulin, lymphedema/chronic venous insufficiency and cirrhosis that presents to the clinic for a 1 year history of nonhealing wounds to his lower extremities bilaterally. He reports the starting spontaneously. He states that he has been following with [facility] and they have been using [facility] antibiotic spray to the legs. He is not wearing compression stockings or wraps. He is also tried Medihoney and collagen in the past with little benefit. He currently denies systemic signs of infection. 5/30; patient presents for follow-up. He has been using triamcinolone cream to the periwound and Santyl to the wound beds. There has been improvement in wound healing. He denies signs of infection and has [name] issues or complaints today. 6/4; both legs look considerably better. He has a wound on the right lateral lower leg and the left medial lower leg. Significant hemosiderin in the right leg less so on the left. We have been using Hydrofera Blue under compression 6/13; patient presents for follow-up. We have been using antibiotic ointment with Hydrofera Blue under compression therapy. The wounds are smaller. He has [name] issues or complaints today. We discussed ordering juxta lite compression garment wraps T use once his wounds heal as he has hard time putting on o compression  stockings. He was agreeable with this. 6/20; patient presents for follow-up. We have been using antibiotic ointment with Hydrofera Blue under compression therapy to the lower extremities bilaterally. Wounds are smaller. He has been approved for PuraPly and was agreeable with placement today. He denies signs of infection. 6/27; patient presents for follow-up. We have been using antibiotic ointment with Hydrofera Blue under compression therapy to the left lower extremity. This wound is healed. He has his juxta lite compression wraps with him today. T the right lateral leg we have been using PuraPly under compression and that wound o is smaller today. Unfortunately he has developed skin breakdown from the Steri-Strip that was used to hold the PuraPly in place and this has created a new wound on the right leg. 7/8; patient presents for follow-up. We have been using PuraPly to the right lateral leg wound Under compression wrap. Wound is slightly smaller however original wound used for PuraPly is healed. He unfortunately developed a new wound to the left lower extremity but it looks like this was from potential scratching. He has been using his juxta lite compression here. 716; patient presents for follow-up. We have been using antibiotic ointment with Hydrofera Blue under 3 layer compression to lower extremities bilaterally. Unfortunately he has continued to develop wounds to the left lower extremity. He currently denies signs of infection. 7/22; patient presents for follow-up. He has been using Santyl and Hydrofera Blue with triamcinolone cream under her juxta lite compression wraps daily. [name] new wounds today. Current wounds are stable. Less irritation to the periwound. 7/29; patient presents for follow-up. He has been using Santyl and Hydrofera Blue with triamcinolone cream under the juxta lite compression wraps. He is having a hard time  keeping the [facility] in place to the wound beds. He denies  signs of infection. 8/5; patient presents for follow-up. Has been using Santyl to the wound beds and triamcinolone cream under the juxta lite compression. He has [name] issues or complaints today. Wounds are overall stable. 8/12; patient presents for follow-up. He has been using Santyl to the wound beds and triamcinolone cream to the periwound under JuxtaLite compression. He is scheduled to see dermatology this week. Wounds are overall stable. 8/19; patient presents for follow-up. He has been using Santyl and PolyMem to the wound beds along with triamcinolone cream to the periwound under his juxta light compression. He saw dermatology and they obtained a biopsy of the distal medial wound on the left leg. He also had venous reflux studies last week that showed [name] venous reflux on the left and only venous reflux to the CFV with [name] venous reflux throughout the rest of the venous system. 9/3;Patient presents for follow-up. Patient has been using PolyMem silver with Santyl. Patient was seen by dermatology and they did a punch biopsy. 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 Patient was prescribed silver Silvadene. He has not started this yet. Overall wounds appear well-healing. 9/9; patient presents for follow-up. We have been using PolyMem silver with Santyl. Wounds are smaller. He has been using his compression Velcro wrap daily. He declines debridement today. 9/23; this is a patient who clearly has chronic venous insufficiency. For about the last 18 months he has developed recurrent small nodules on his bilateral lower legs these are painful initially seemed to have a white substance on the top they then break open and eventually will heal over. These occurred even while he was under compression. 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 The patient has been using Santyl and an polymen Ag. He has been using his  own compression stockings. 9/30; everything is just about healed on the left leg. Still has 2 small superficial areas on the left. On the right 2 areas with depth I wonder whether these were necrobiosis lipoidica lesions that the dermatologist diagnosed by biopsying one of the areas on the left. I reviewed the note after they left the clinic last week. 10/7; still with the 2 spots which are small wounds on the right anterior and right lateral lower leg. These have very adherent slough. He also has a small painful area on the left posterior Achilles and the left posterior lower leg. The biopsy apparently showed necrobiosis lipoidica. I think this is the cause of the recurrent wounds over the last 18 months. RIYON, MCILROY ([id number]) [id number] Page 2 of 7 10/14; the areas on the left are smaller. This includes the biopsy site on the left Achilles area. The areas on the right are about the same this is the right lateral and right anterior lower leg. I debrided both of these last week he did not want to be debrided today. As it turns out he has 1 pound jar of triamcinolone 0.1% I not sure what that was being used for. Some changes of chronic stasis dermatitis perhaps when he first came in according to our intake nurse. 10/22; again [name] change in most of these areas. He does not want debridements. We have been using triamcinolone Santyl and Hydrofera Blue but very little change. 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 We have 2 areas on the right  and 2 areas on the left 1 of which was the biopsy site 10/29; some improvement. The superior wounds on both sides have dried up eschar on the surface he has not been allowing debridement but these look better. The deeper punched-out area on the right also appears to have a better surface. The biopsy site again eschared over but looks as though it is contracting. I changed to Iodosorb last week with  TCA. Multiple prior wounds likely necrobiosis although he does have venous insufficiency 11/12; 2-week follow-up. The patient has 2 open wounds on the right 1 on the lower anterior and 1 on the lower medial leg. He has a small open area on the left medial lower leg. I have looked through his previous records. He has biopsy-proven necrobiosis lipoidica and atrophie blanche. The wounds are small and punched-out look more like the remanence of necrobiosis then venous wounds. Nevertheless his wife shows me pictures of his legs where he currently has chronic venous insufficiency with stasis dermatitis. 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 We have improved the condition of the chronic venous insufficiency and stasis with compression. With regards to the 3 open areas 2 on the right and 1 on the left lower medial leg all of these look somewhat better although there is still some depth. At the 2 on the right have a clean base [name] debridement is required, on the left the wound still has some depth although the base of the wound looks better with the opening almost too small to allow debridement. 12/3; both areas on the right leg are healed this week [necrobiosis lipoidica]. His edema is well-controlled on the right. He has a JuxtaLite stocking. He still has 1 area on the medial left ankle which was his original biopsy site presumably necrobiosis lipoidica as well 12/17; his areas on the right leg are healed [necrobiosis lipoidica related to his diabetes]. He has his juxta lite stocking on on the right and everything is closed. He is moisturizing his skin. He still at the area on the left medial ankle which was his original biopsy site from dermatology. 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 Electronic  Signature(s) Signed: 07/23/2023 4:32:13 PM By: [name] MD Entered By: [name] on 07/23/2023 16:10:96 -------------------------------------------------------------------------------- Physical Exam Details Patient Name: Date of Service: [name], [name] [name]. 07/23/2023 8:00 A [name] Medical Record Number: [id number] Patient Account Number: [id number] Date of Birth/Sex: Treating RN: [name] [name] (67 y.o. [name]) Primary Care Provider: Cranford [name] Other Clinician: Referring Provider: Treating Provider/Extender: [name] [name] in Treatment: 30 Constitutional Sitting or standing Blood Pressure is within target range for patient.. Pulse regular and within target range for patient.Marland Kitchen Respirations regular, non-labored and within target range.. Temperature is normal and within the target range for the patient.Marland Kitchen Appears in [name] distress. Notes Wound exam; everything is healed on the right leg I did not look at this today but did so the last time he was here. On the left ankle just above the medial malleolus was the biopsy site. This is still a divot however it is epithelialized. [name] evidence of surrounding infection and under illumination nothing is open here. Electronic Signature(s) Signed: 07/23/2023 4:32:13 PM By: [name] MD Entered By: [name] on 07/23/2023 08:27:23 -------------------------------------------------------------------------------- Physician Orders Details Patient Name: Date of Service: [name],  [name] [name]. 07/23/2023 8:00 A [name] Medical Record Number: [id number] Patient Account Number: [id number] Date of Birth/Sex: Treating RN: [name] [name] (67 y.o. [name] Primary Care Provider: Cranford [name] Other Clinician: Referring Provider: Treating Provider/Extender: Abdul, Shiver, Milus Height ([id number]) [id number] Page 3 of 7 Weeks in Treatment: 30 The following information was scribed by: [name] The information was scribed for: [name] Verbal / Phone Orders: [name] Diagnosis Coding ICD-10 Coding Code Description I87.313 Chronic venous hypertension (idiopathic) with ulcer of bilateral lower extremity L97.818 Non-pressure chronic ulcer of other part of right lower leg with other specified severity L97.828 Non-pressure chronic ulcer of other part of left lower leg with other specified severity I89.0 Lymphedema, not elsewhere classified E11.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 other skin ulcer Discharge From [facility] Discharge from [facility] - Call if any future wound care needs. wear compression stockings for life. Replace yearly. Edema Control - Orders / Instructions Elevate legs to the level of the heart or above for 30 minutes daily and/or when sitting for 3-4 times a day throughout the day. Avoid standing for long periods of time. Patient to wear own compression stockings every day. Exercise regularly Moisturize legs daily. Compression stocking or Garment 30-40 mm/Hg pressure to: - juxtalite HD both legs. Electronic Signature(s) Signed: 07/23/2023 4:32:13 PM By: [name] MD Signed: 07/24/2023 5:35:56 PM By: [name] RN, BSN Entered By: [name] on 07/23/2023 08:[name]:48 -------------------------------------------------------------------------------- Problem List Details Patient Name: Date of Service: [name], [name] [name]. 07/23/2023 8:00 A [name] Medical Record Number: [id number] Patient Account Number: [id number] Date of Birth/Sex: Treating RN: Oct [birth year], [name] [name] (67 y.o. [name], [name] Primary Care Provider: Cranford [name] Other Clinician: Referring Provider: Treating Provider/Extender: [name] [name] in Treatment: 30 Active Problems ICD-10 Encounter Code Description Active Date MDM Diagnosis I87.313 Chronic venous hypertension (idiopathic) with ulcer of bilateral lower extremity 12/24/2022 [name] Yes L97.818 Non-pressure chronic  ulcer of other part of right lower leg with other specified 12/24/2022 [name] Yes severity L97.828 Non-pressure chronic ulcer of other part of left lower leg with other specified 12/24/2022 [name] Yes severity I89.0 Lymphedema, not elsewhere classified 12/24/2022 [name] Yes E11.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 other skin ulcer 12/24/2022 [name] Yes [name] [name], [name] [name] ([id number]) [phone].pdf Page 4 of 7 Inactive Problems ICD-10 Code Description Active Date Inactive Date 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 07/23/2023 8:00 A [name] Medical Record Number: [id number] Patient Account Number: [id number] Date of Birth/Sex: Treating RN: [name] [name], [name] [name] (67 y.o. [name]) Primary Care Provider: Cranford [name] Other Clinician: Referring Provider: Treating Provider/Extender: [name] [name] in Treatment: 30 Subjective History of Present Illness (HPI) 12/24/2022 [name] is a 67 year old male with a past medical history of controlled type 2 diabetes on insulin, lymphedema/chronic venous insufficiency and cirrhosis that presents to the clinic for a 1 year history of nonhealing wounds to his lower extremities bilaterally. He reports the starting spontaneously. He states that he has been following with [facility] and they have been using [facility] antibiotic spray to the legs. He is not wearing compression stockings or wraps. He is also tried Medihoney and collagen in the past with little benefit. He currently denies systemic signs of  infection. 5/30; patient presents for follow-up. He has been using triamcinolone cream  to the periwound and Santyl to the wound beds. There has been improvement in wound healing. He denies signs of infection and has [name] issues or complaints today. 6/4; both legs look considerably better. He has a wound on the right lateral lower leg and the left medial lower leg. Significant hemosiderin in the right leg less so on the left. We have been using Hydrofera Blue under compression 6/13; patient presents for follow-up. We have been using antibiotic ointment with Hydrofera Blue under compression therapy. The wounds are smaller. He has [name] issues or complaints today. We discussed ordering juxta lite compression garment wraps T use once his wounds heal as he has hard time putting on o compression stockings. He was agreeable with this. 6/20; patient presents for follow-up. We have been using antibiotic ointment with Hydrofera Blue under compression therapy to the lower extremities bilaterally. Wounds are smaller. He has been approved for PuraPly and was agreeable with placement today. He denies signs of infection. 6/27; patient presents for follow-up. We have been using antibiotic ointment with Hydrofera Blue under compression therapy to the left lower extremity. This wound is healed. He has his juxta lite compression wraps with him today. T the right lateral leg we have been using PuraPly under compression and that wound o is smaller today. Unfortunately he has developed skin breakdown from the Steri-Strip that was used to hold the PuraPly in place and this has created a new wound on the right leg. 7/8; patient presents for follow-up. We have been using PuraPly to the right lateral leg wound Under compression wrap. Wound is slightly smaller however original wound used for PuraPly is healed. He unfortunately developed a new wound to the left lower extremity but it looks like this was from potential scratching. He has been using his juxta lite compression here. 716; patient presents for follow-up.  We have been using antibiotic ointment with Hydrofera Blue under 3 layer compression to lower extremities bilaterally. Unfortunately he has continued to develop wounds to the left lower extremity. He currently denies signs of infection. 7/22; patient presents for follow-up. He has been using Santyl and Hydrofera Blue with triamcinolone cream under her juxta lite compression wraps daily. [name] new wounds today. Current wounds are stable. Less irritation to the periwound. 7/29; patient presents for follow-up. He has been using Santyl and Hydrofera Blue with triamcinolone cream under the juxta lite compression wraps. He is having a hard time keeping the [facility] in place to the wound beds. He denies signs of infection. 8/5; patient presents for follow-up. Has been using Santyl to the wound beds and triamcinolone cream under the juxta lite compression. He has [name] issues or complaints today. Wounds are overall stable. 8/12; patient presents for follow-up. He has been using Santyl to the wound beds and triamcinolone cream to the periwound under JuxtaLite compression. He is [name] [name], [name] [name] ([id number]) [id number] Page 5 of 7 scheduled to see dermatology this week. Wounds are overall stable. 8/19; patient presents for follow-up. He has been using Santyl and PolyMem to the wound beds along with triamcinolone cream to the periwound under his juxta light compression. He saw dermatology and they obtained a biopsy of the distal medial wound on the left leg. He also had venous reflux studies last week that showed [name] venous reflux on the left and only venous reflux to the CFV with [name] venous reflux throughout the rest of the venous system.  9/3;Patient presents for follow-up. Patient has been using PolyMem silver with Santyl. Patient was seen by dermatology and they did a punch biopsy. 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 Patient was prescribed silver Silvadene. He has not  started this yet. Overall wounds appear well-healing. 9/9; patient presents for follow-up. We have been using PolyMem silver with Santyl. Wounds are smaller. He has been using his compression Velcro wrap daily. He declines debridement today. 9/23; this is a patient who clearly has chronic venous insufficiency. For about the last 18 months he has developed recurrent small nodules on his bilateral lower legs these are painful initially seemed to have a white substance on the top they then break open and eventually will heal over. These occurred even while he was under compression. 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 The patient has been using Santyl and an polymen Ag. He has been using his own compression stockings. 9/30; everything is just about healed on the left leg. Still has 2 small superficial areas on the left. On the right 2 areas with depth I wonder whether these were necrobiosis lipoidica lesions that the dermatologist diagnosed by biopsying one of the areas on the left. I reviewed the note after they left the clinic last week. 10/7; still with the 2 spots which are small wounds on the right anterior and right lateral lower leg. These have very adherent slough. He also has a small painful area on the left posterior Achilles and the left posterior lower leg. The biopsy apparently showed necrobiosis lipoidica. I think this is the cause of the recurrent wounds over the last 18 months. 10/14; the areas on the left are smaller. This includes the biopsy site on the left Achilles area. The areas on the right are about the same this is the right lateral and right anterior lower leg. I debrided both of these last week he did not want to be debrided today. As it turns out he has 1 pound jar of triamcinolone 0.1% I not sure what that was being used for. Some changes of chronic stasis dermatitis perhaps when  he first came in according to our intake nurse. 10/22; again [name] change in most of these areas. He does not want debridements. We have been using triamcinolone Santyl and Hydrofera Blue but very little change. 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 We have 2 areas on the right and 2 areas on the left 1 of which was the biopsy site 10/29; some improvement. The superior wounds on both sides have dried up eschar on the surface he has not been allowing debridement but these look better. The deeper punched-out area on the right also appears to have a better surface. The biopsy site again eschared over but looks as though it is contracting. I changed to Iodosorb last week with TCA. Multiple prior wounds likely necrobiosis although he does have venous insufficiency 11/12; 2-week follow-up. The patient has 2 open wounds on the right 1 on the lower anterior and 1 on the lower medial leg. He has a small open area on the left medial lower leg. I have looked through his previous records. He has biopsy-proven necrobiosis lipoidica and atrophie blanche. The wounds are small and punched-out look more like the remanence of necrobiosis then venous wounds. Nevertheless his wife shows me pictures of his legs where he currently has chronic venous insufficiency with stasis  dermatitis. 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 We have improved the condition of the chronic venous insufficiency and stasis with compression. With regards to the 3 open areas 2 on the right and 1 on the left lower medial leg all of these look somewhat better although there is still some depth. At the 2 on the right have a clean base [name] debridement is required, on the left the wound still has some depth although the base of the wound looks better with the  opening almost too small to allow debridement. 12/3; both areas on the right leg are healed this week [necrobiosis lipoidica]. His edema is well-controlled on the right. He has a JuxtaLite stocking. He still has 1 area on the medial left ankle which was his original biopsy site presumably necrobiosis lipoidica as well 12/17; his areas on the right leg are healed [necrobiosis lipoidica related to his diabetes]. He has his juxta lite stocking on on the right and everything is closed. He is moisturizing his skin. He still at the area on the left medial ankle which was his original biopsy site from dermatology. 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 Objective Constitutional Sitting or standing Blood Pressure is within target range for patient.. Pulse regular and within target range for patient.Marland Kitchen Respirations regular, non-labored and within target range.. Temperature is normal and within the target range for the patient.Marland Kitchen Appears in [name] distress. Vitals Time Taken: 8:10 AM, Height: 72 in, Weight: 245 lbs, BMI: 33.2, Temperature: 97.7 F, Pulse: 51 bpm, Respiratory Rate: 20 breaths/min, Blood Pressure: 126/73 mmHg. General Notes: Wound exam; everything is healed on the right leg I did not look at this today but did so the last time he was here. On the left ankle just above the medial malleolus was the biopsy site. This is still a divot however it is epithelialized. [name] evidence of surrounding infection and under illumination nothing is open here. Integumentary (Hair, Skin) Wound #4 status is Healed - Epithelialized. Original cause of wound was Gradually Appeared. The date acquired was: 02/11/2023. The wound has been in treatment 23 weeks. The wound is located on the Left,Distal,Medial Lower Leg. The wound measures 0cm length x 0cm width x 0cm depth; 0cm^2 area and 0cm^3 volume. There is [name] tunneling or undermining noted. There is a none present amount of drainage  noted. The wound margin is distinct with the outline attached to the wound base. There is [name] granulation within the wound bed. There is [name] necrotic tissue within the wound bed. The periwound skin appearance did not exhibit: Callus, Crepitus, Excoriation, Induration, Rash, Scarring, Dry/Scaly, Maceration, Atrophie Blanche, Cyanosis, Ecchymosis, Hemosiderin Staining, Mottled, Pallor, Rubor, Erythema. Periwound temperature was noted as [name] Abnormality. 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 wear compression stockings for life. Replace yearly. Edema Control - Orders / Instructions: Elevate legs to the level of the heart or above for 30 minutes daily and/or  when sitting for 3-4 times a day throughout the day. Avoid standing for long periods of time. Patient to wear own compression stockings every day. Exercise regularly Moisturize legs daily. Compression stocking or Garment 30-40 mm/Hg pressure to: - juxtalite HD both legs. 1. 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 2. In any case his wounds have healed he has bilateral juxta lite stockings and is proving to be compliant. 3. He can be discharged from the clinic Electronic Signature(s) Signed: 07/23/2023 4:32:13 PM By: [name] MD Entered By: [name] on 07/23/2023  08:28:46 -------------------------------------------------------------------------------- SuperBill Details Patient Name: Date of Service: [name], [name] [name]. 07/23/2023 Medical Record Number: [id number] Patient Account Number: [id number] Date of Birth/Sex: Treating RN: [name] [name] (67 y.o. [name], [name] Primary Care Provider: Cranford [name] Other Clinician: Referring Provider: Treating Provider/Extender: [name] [name] in Treatment: 30 Diagnosis Coding ICD-10 Codes Code Description [phone] Chronic venous hypertension (idiopathic) with ulcer of bilateral lower extremity L97.818 Non-pressure chronic ulcer of other part of right lower leg with other specified severity L97.828 Non-pressure chronic ulcer of other part of left lower leg with other specified severity I89.0 Lymphedema, not elsewhere classified E11.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 other skin ulcer Facility Procedures : CPT4 Code: [id number] Description: 99213 - WOUND CARE VISIT-LEV 3 EST PT Modifier: Quantity: 1 Physician Procedures : CPT4 Code Description Modifier [id number] - WC PHYS LEVEL 2 - EST PT ICD-10 Diagnosis Description 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 [name] [name], [name] [name] ([id number]) [id number] L97.818 Non-pressure chronic ulcer of other part of right lower leg with other specified severity L97.828 Non-pressure chronic ulcer of other part of left lower leg with other specified severity Quantity: 1 .pdf Page 7 of 7 Electronic Signature(s) Signed: 07/23/2023 4:32:13 PM By: [name] MD Entered By: [name] on 07/23/2023 08:29:10

## 2023-08-06 ENCOUNTER — Encounter (HOSPITAL_BASED_OUTPATIENT_CLINIC_OR_DEPARTMENT_OTHER): Payer: Medicare Other | Admitting: General Surgery

## 2023-09-16 ENCOUNTER — Encounter (INDEPENDENT_AMBULATORY_CARE_PROVIDER_SITE_OTHER): Payer: Medicare Other | Admitting: Ophthalmology

## 2023-09-16 DIAGNOSIS — I10 Essential (primary) hypertension: Secondary | ICD-10-CM | POA: Diagnosis not present

## 2023-09-16 DIAGNOSIS — H35033 Hypertensive retinopathy, bilateral: Secondary | ICD-10-CM | POA: Diagnosis not present

## 2023-09-16 DIAGNOSIS — E113513 Type 2 diabetes mellitus with proliferative diabetic retinopathy with macular edema, bilateral: Secondary | ICD-10-CM

## 2023-09-16 DIAGNOSIS — H43813 Vitreous degeneration, bilateral: Secondary | ICD-10-CM

## 2023-09-16 DIAGNOSIS — Z794 Long term (current) use of insulin: Secondary | ICD-10-CM | POA: Diagnosis not present

## 2023-10-12 ENCOUNTER — Encounter (HOSPITAL_COMMUNITY): Payer: Self-pay | Admitting: Emergency Medicine

## 2023-10-12 ENCOUNTER — Emergency Department (HOSPITAL_COMMUNITY)
Admission: EM | Admit: 2023-10-12 | Discharge: 2023-10-13 | Disposition: A | Discharge status: Home / Self Care | Attending: Emergency Medicine | Admitting: Emergency Medicine

## 2023-10-12 ENCOUNTER — Other Ambulatory Visit: Payer: Self-pay

## 2023-10-12 DIAGNOSIS — R42 Dizziness and giddiness: Secondary | ICD-10-CM | POA: Diagnosis present

## 2023-10-12 DIAGNOSIS — Z7982 Long term (current) use of aspirin: Secondary | ICD-10-CM | POA: Diagnosis not present

## 2023-10-12 DIAGNOSIS — E860 Dehydration: Secondary | ICD-10-CM | POA: Insufficient documentation

## 2023-10-12 DIAGNOSIS — Z794 Long term (current) use of insulin: Secondary | ICD-10-CM | POA: Diagnosis not present

## 2023-10-12 DIAGNOSIS — E7220 Disorder of urea cycle metabolism, unspecified: Secondary | ICD-10-CM | POA: Diagnosis not present

## 2023-10-12 NOTE — ED Triage Notes (Addendum)
 Pt here with c/o dizziness and also states he believes his Ammonia level is high. Reports dizziness only with movement.

## 2023-10-13 ENCOUNTER — Emergency Department (HOSPITAL_COMMUNITY)

## 2023-10-13 DIAGNOSIS — E860 Dehydration: Secondary | ICD-10-CM | POA: Diagnosis not present

## 2023-10-13 LAB — HEPATIC FUNCTION PANEL
ALT: 42 U/L (ref 0–44)
AST: 50 U/L — ABNORMAL HIGH (ref 15–41)
Albumin: 3.2 g/dL — ABNORMAL LOW (ref 3.5–5.0)
Alkaline Phosphatase: 149 U/L — ABNORMAL HIGH (ref 38–126)
Bilirubin, Direct: 0.6 mg/dL — ABNORMAL HIGH (ref 0.0–0.2)
Indirect Bilirubin: 1.4 mg/dL — ABNORMAL HIGH (ref 0.3–0.9)
Total Bilirubin: 2 mg/dL — ABNORMAL HIGH (ref 0.0–1.2)
Total Protein: 7.4 g/dL (ref 6.5–8.1)

## 2023-10-13 LAB — BASIC METABOLIC PANEL
Anion gap: 8 (ref 5–15)
BUN: 27 mg/dL — ABNORMAL HIGH (ref 8–23)
CO2: 20 mmol/L — ABNORMAL LOW (ref 22–32)
Calcium: 8.8 mg/dL — ABNORMAL LOW (ref 8.9–10.3)
Chloride: 112 mmol/L — ABNORMAL HIGH (ref 98–111)
Creatinine, Ser: 1.61 mg/dL — ABNORMAL HIGH (ref 0.61–1.24)
GFR, Estimated: 47 mL/min — ABNORMAL LOW (ref >60)
Glucose, Bld: 137 mg/dL — ABNORMAL HIGH (ref 70–99)
Potassium: 4.1 mmol/L (ref 3.5–5.1)
Sodium: 140 mmol/L (ref 135–145)

## 2023-10-13 LAB — URINALYSIS, ROUTINE W REFLEX MICROSCOPIC
Bilirubin Urine: NEGATIVE
Glucose, UA: 50 mg/dL — AB
Hgb urine dipstick: NEGATIVE
Ketones, ur: NEGATIVE mg/dL
Leukocytes,Ua: NEGATIVE
Nitrite: NEGATIVE
Protein, ur: NEGATIVE mg/dL
Specific Gravity, Urine: 1.019 (ref 1.005–1.030)
pH: 6 (ref 5.0–8.0)

## 2023-10-13 LAB — CBC
HCT: 37.7 % — ABNORMAL LOW (ref 39.0–52.0)
Hemoglobin: 13 g/dL (ref 13.0–17.0)
MCH: 32.2 pg (ref 26.0–34.0)
MCHC: 34.5 g/dL (ref 30.0–36.0)
MCV: 93.3 fL (ref 80.0–100.0)
Platelets: 119 10*3/uL — ABNORMAL LOW (ref 150–400)
RBC: 4.04 MIL/uL — ABNORMAL LOW (ref 4.22–5.81)
RDW: 14 % (ref 11.5–15.5)
WBC: 6.3 10*3/uL (ref 4.0–10.5)
nRBC: 0 % (ref 0.0–0.2)

## 2023-10-13 LAB — CBG MONITORING, ED: Glucose-Capillary: 136 mg/dL — ABNORMAL HIGH (ref 70–99)

## 2023-10-13 LAB — AMMONIA: Ammonia: 178 umol/L — ABNORMAL HIGH (ref 9–35)

## 2023-10-13 MED ORDER — SODIUM CHLORIDE 0.9 % IV BOLUS
1000.0000 mL | Freq: Once | INTRAVENOUS | Status: AC
  Administered 2023-10-13: 1000 mL via INTRAVENOUS

## 2023-10-13 NOTE — ED Notes (Signed)
 Pt ambulated in hallway and denied dizziness.

## 2023-10-13 NOTE — Discharge Instructions (Signed)
 As we discussed, please start taking your lactulose as prescribed by Dr. Ouida Sills.  You will need to be seen by GI/liver doctor soon

## 2023-10-13 NOTE — ED Provider Notes (Signed)
 [facility] DEPARTMENT AT [facility] Provider Note   CSN: [id number] Arrival date & time: 10/12/23  2346     History  Chief Complaint  Patient presents with   Dizziness    [name] [name] is a 68 y.o. male.  The history is provided by the patient and a relative.  Patient with history of cryptogenic cirrhosis presents with dizziness and concern for his ammonia level.  Patient reports he saw his PCP recently and he was told that his ammonia was elevated.  He was started on lactulose which increased his bowel movements.  Around 2 days ago he stopped the lactulose due to the frequency of his bowel movements.  Since that time has been having dizziness mostly upon standing.  He reports increased gas, but no abdominal pain.  No fevers or vomiting.  No chest pain or shortness of breath.  No syncope.  No recent trauma. He had a previous TIPS procedure several years ago at an outside hospital     Home Medications Prior to Admission medications   Medication Sig Start Date End Date Taking? Authorizing Provider  ACCU-CHEK AVIVA PLUS test strip 1 each by Other route as needed. 08/07/22   [provider]  amLODipine (NORVASC) 10 MG tablet Take 1 tablet (10 mg total) by mouth at bedtime. 11/30/19   Netta Neat., NP  aspirin EC 81 MG tablet Take 81 mg by mouth daily.    [provider]  carvedilol (COREG) 6.25 MG tablet Take 6.25 mg by mouth 2 (two) times daily with a meal.    [provider]  cloNIDine (CATAPRES) 0.3 MG tablet Take 0.3 mg by mouth 3 (three) times daily. 08/14/19   [provider]  Cyanocobalamin (VITAMIN B-12 IJ) Inject 1 mL as directed every 30 (thirty) days.  Patient not taking: Reported on 10/24/2022    [provider]  erythromycin ophthalmic ointment Place a 1/2 inch ribbon of ointment into the lower eyelid right eye x 5 days at bedtime 12/11/20   Bing Neighbors, NP  furosemide (LASIX) 40 MG tablet Take 40 mg  by mouth daily. 12/28/16   [provider]  glimepiride (AMARYL) 4 MG tablet Take 4 mg by mouth daily before breakfast.    [provider]  LANTUS SOLOSTAR 100 UNIT/ML Solostar Pen Inject 30 Units into the skin daily. 08/03/19   [provider]  losartan (COZAAR) 100 MG tablet Take 100 mg by mouth daily. 08/14/19   [provider]  omeprazole (PRILOSEC) 20 MG capsule TAKE 1 CAPSULE BY MOUTH EVERY MORNING FOR ACID REFLUX. 07/05/14   Rehman, Joline Maxcy, MD  SURE COMFORT PEN NEEDLES 31G X 8 MM MISC  08/21/22   [provider]  TRESIBA FLEXTOUCH 100 UNIT/ML FlexTouch Pen Inject 32 Units into the skin daily. 08/21/22   [provider]  zolpidem (AMBIEN) 5 MG tablet Take 2.5-5 mg by mouth at bedtime as needed. 06/12/19   [provider]      Allergies    Patient has no known allergies.    Review of Systems   Review of Systems  Constitutional:  Negative for fever.  Gastrointestinal:  Negative for blood in stool and vomiting.  Neurological:  Positive for dizziness. Negative for syncope.    Physical Exam Updated Vital Signs BP (!) 159/67   Pulse 61   Temp 98.2 F (36.8 C) (Oral)   Resp 15   Ht 1.791 m (5' 10.5")   Wt  111.1 kg   SpO2 97%   BMI 34.66 kg/m  Physical Exam CONSTITUTIONAL: Elderly, no acute distress HEAD: Normocephalic/atraumatic EYES: EOMI/PERRL, no nystagmus, no icterus ENMT: Mucous membranes moist NECK: supple no meningeal signs, CV: S1/S2 noted, no murmurs/rubs/gallops noted LUNGS: Lungs are clear to auscultation bilaterally, no apparent distress ABDOMEN: soft, nontender, no rebound or guarding, protuberant NEURO:Awake/alert, face symmetric, no arm or leg drift is noted No asterixis Equal 5/5 strength with hip flexion,knee flex/extension, foot dorsi/plantar flexion EXTREMITIES: pulses normal, full ROM SKIN: warm, color normal PSYCH: no abnormalities of mood noted, alert and oriented to situation  ED Results /  Procedures / Treatments   Labs (all labs ordered are listed, but only abnormal results are displayed) Labs Reviewed  BASIC METABOLIC PANEL - Abnormal; Notable for the following components:      Result Value   Chloride 112 (*)    CO2 20 (*)    Glucose, Bld 137 (*)    BUN 27 (*)    Creatinine, Ser 1.61 (*)    Calcium 8.8 (*)    GFR, Estimated 47 (*)    All other components within normal limits  CBC - Abnormal; Notable for the following components:   RBC 4.04 (*)    HCT 37.7 (*)    Platelets 119 (*)    All other components within normal limits  URINALYSIS, ROUTINE W REFLEX MICROSCOPIC - Abnormal; Notable for the following components:   Glucose, UA 50 (*)    All other components within normal limits  AMMONIA - Abnormal; Notable for the following components:   Ammonia 178 (*)    All other components within normal limits  HEPATIC FUNCTION PANEL - Abnormal; Notable for the following components:   Albumin 3.2 (*)    AST 50 (*)    Alkaline Phosphatase 149 (*)    Total Bilirubin 2.0 (*)    Bilirubin, Direct 0.6 (*)    Indirect Bilirubin 1.4 (*)    All other components within normal limits  CBG MONITORING, ED - Abnormal; Notable for the following components:   Glucose-Capillary 136 (*)    All other components within normal limits    EKG EKG Interpretation Date/Time:  Sunday October 13 2023 00:02:51 EST Ventricular Rate:  63 PR Interval:  161 QRS Duration:  97 QT Interval:  424 QTC Calculation: 434 R Axis:   54  Text Interpretation: Sinus rhythm Borderline repolarization abnormality No significant change since last tracing Confirmed by Zadie Rhine ([id number]) on 10/13/2023 1:45:41 AM  Radiology CT Head Wo Contrast Result Date: 10/13/2023 CLINICAL DATA:  Dizziness EXAM: CT HEAD WITHOUT CONTRAST TECHNIQUE: Contiguous axial images were obtained from the base of the skull through the vertex without intravenous contrast. RADIATION DOSE REDUCTION: This exam was performed according to  the departmental dose-optimization program which includes automated exposure control, adjustment of the mA and/or kV according to patient size and/or use of iterative reconstruction technique. COMPARISON:  None Available. FINDINGS: Brain: Normal anatomic configuration. Parenchymal volume loss is commensurate with the patient's age. Mild subcortical and periventricular white matter changes are present likely reflecting the sequela of small vessel ischemia. No abnormal intra or extra-axial mass lesion or fluid collection. No abnormal mass effect or midline shift. No evidence of acute intracranial hemorrhage or infarct. Ventricular size is normal. Cerebellum unremarkable. Vascular: No asymmetric hyperdense vasculature at the skull base. Skull: Intact Sinuses/Orbits: Paranasal sinuses are clear. Orbits are unremarkable. Other: Mastoid air cells and middle ear cavities are clear. IMPRESSION: 1. No acute intracranial hemorrhage  or infarct. 2. Mild senescent change. Electronically Signed   By: Helyn Numbers M.D.   On: 10/13/2023 03:10    Procedures Procedures    Medications Ordered in ED Medications  sodium chloride 0.9 % bolus 1,000 mL (1,000 mLs Intravenous New Bag/Given 10/13/23 0147)    ED Course/ Medical Decision Making/ A&P Clinical Course as of 10/13/23 0421  Sun Oct 13, 2023  0145 Patient presents with increased dizziness upon standing.  Denies syncope.  This also coincided with him stopping lactulose and now he has hyperammonemia.  However patient is overall poor historian, will obtain CT head to explore other causes for his dizziness in the past 2 days and then reassess [DW]  0334 Creatinine(!): 1.61 Renal insufficiency [DW]  0334 Ammonia(!): 178 hyperammonemia [DW]  0334 Total Bilirubin(!): 2.0 Hyperbilirubinemia [DW]  8295 Patient with known history of cryptogenic cirrhosis that has been well-controlled until recently with elevated ammonia.  He started feeling dizzy when he stopped taking  the lactulose. Patient has been given IV fluids here CT head is negative [DW]  0420 Patient feels improved.  He ambulated without difficulty.  Although his hyperammonemia, he has no mental status changes, no confusion.  No focal weakness to suggest occult CVA.  He was dehydrated and given IV fluids.  He has no signs of any infectious etiology.  He has no abdominal tenderness to suggest spontaneous bacterial peritonitis.  He denies any history of GI bleed.  Patient would like to be discharged home, and will follow-up closely with PCP and then gastroenterology as he may need further testing for his worsening liver dysfunction [DW]    Clinical Course User Index [DW] Zadie Rhine, MD                                 Medical Decision Making Amount and/or Complexity of Data Reviewed Labs: ordered. Decision-making details documented in ED Course. Radiology: ordered.   This patient presents to the ED for concern of dizziness, this involves an extensive number of treatment options, and is a complaint that carries with it a high risk of complications and morbidity.  The differential diagnosis includes but is not limited to CVA, intracranial hemorrhage, acute coronary syndrome, renal failure, urinary tract infection, electrolyte disturbance, pneumonia    Comorbidities that complicate the patient evaluation: Patient's presentation is complicated by their history of cirrhosis  Social Determinants of Health: Patient's  poor health literacy, medication nonadherence   increases the complexity of managing their presentation  Additional history obtained: Additional history obtained from family sister at bedside Records reviewed  GI notes reviewed  Lab Tests: I Ordered, and personally interpreted labs.  The pertinent results include: Hyperammonemia, renal insufficiency  Imaging Studies ordered: I ordered imaging studies including CT scan head   I independently visualized and interpreted imaging  which showed no acute findings I agree with the radiologist interpretation  Medicines ordered and prescription drug management: I ordered medication including fluids for dehydration Reevaluation of the patient after these medicines showed that the patient    improved  Reevaluation: After the interventions noted above, I reevaluated the patient and found that they have :improved  Complexity of problems addressed: Patient's presentation is most consistent with  acute presentation with potential threat to life or bodily function  Disposition: After consideration of the diagnostic results and the patient's response to treatment,  I feel that the patent would benefit from discharge   .  Final Clinical Impression(s) / ED Diagnoses Final diagnoses:  Dizziness  Dehydration  Hyperammonemia (HCC)    Rx / DC Orders ED Discharge Orders     None         Zadie Rhine, MD 10/13/23 [phone]

## 2023-11-05 ENCOUNTER — Other Ambulatory Visit (HOSPITAL_COMMUNITY): Payer: Self-pay | Admitting: Internal Medicine

## 2023-11-05 DIAGNOSIS — K7460 Unspecified cirrhosis of liver: Secondary | ICD-10-CM

## 2023-11-07 ENCOUNTER — Encounter (INDEPENDENT_AMBULATORY_CARE_PROVIDER_SITE_OTHER): Payer: Self-pay | Admitting: *Deleted

## 2023-11-19 ENCOUNTER — Ambulatory Visit (HOSPITAL_COMMUNITY)
Admission: RE | Admit: 2023-11-19 | Discharge: 2023-11-19 | Disposition: A | Discharge status: Home / Self Care | Source: Ambulatory Visit | Attending: Internal Medicine | Admitting: Internal Medicine

## 2023-11-19 DIAGNOSIS — K7460 Unspecified cirrhosis of liver: Secondary | ICD-10-CM | POA: Insufficient documentation

## 2023-12-03 ENCOUNTER — Ambulatory Visit (INDEPENDENT_AMBULATORY_CARE_PROVIDER_SITE_OTHER): Admitting: Gastroenterology

## 2023-12-03 ENCOUNTER — Encounter (INDEPENDENT_AMBULATORY_CARE_PROVIDER_SITE_OTHER): Payer: Self-pay | Admitting: Gastroenterology

## 2023-12-03 VITALS — BP 129/66 | HR 61 | Temp 98.4°F | Ht 70.5 in | Wt 249.3 lb

## 2023-12-03 DIAGNOSIS — Z95828 Presence of other vascular implants and grafts: Secondary | ICD-10-CM

## 2023-12-03 DIAGNOSIS — K7682 Hepatic encephalopathy: Secondary | ICD-10-CM

## 2023-12-03 DIAGNOSIS — Z1211 Encounter for screening for malignant neoplasm of colon: Secondary | ICD-10-CM | POA: Insufficient documentation

## 2023-12-03 DIAGNOSIS — K7460 Unspecified cirrhosis of liver: Secondary | ICD-10-CM | POA: Diagnosis not present

## 2023-12-03 DIAGNOSIS — K769 Liver disease, unspecified: Secondary | ICD-10-CM | POA: Insufficient documentation

## 2023-12-03 MED ORDER — PEG 3350-KCL-NA BICARB-NACL 420 G PO SOLR: 4000.0000 mL | Freq: Once | ORAL | 0 refills | Status: AC

## 2023-12-03 NOTE — Progress Notes (Signed)
 Vondra Aldredge Faizan Takahiro Godinho , M.D. Gastroenterology & Hepatology [facility] [address] [place], [place] [address] Primary Care Physician: Artemisa Bile, MD [address] [place] [address]  Chief Complaint:  Cirrhosis , liver lesion   History of Present Illness: [name] [name] is a 68 y.o. male with has history of cryptogenic cirrhosis diagnosed in 2006 when he presented with refractory ascites and eventually went on to have TIPS and has done well. who presents for evaluation of Cirrhosis , liver lesion , Screening colonoscopy  Patient was last seen by Dr. Homero Luster in 2020.  Patient has history of perforated acute cholecystitis and subhepatic abscess.  Treated with cholecystostomy and IV Abx . He underwent abdominal CT on 10/08/2018 which revealed 16mm enhancing lesion in segment 3 concerning for hepatocellular carcinoma. 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   Patient had elevated ammonia last month and was restarted on lactulose.  Patient reports before starting on lactulose he was having some dizziness which has resolved now.  He is only complaint is 8-9 bowel movements when he takes lactulose 3 times daily.The patient denies having any nausea, vomiting, fever, chills, hematochezia, melena, hematemesis, abdominal distention, abdominal pain,  jaundice, pruritus or weight loss.  He had EGD and colonoscopy in April 2009. EGD was normal and colonoscopy revealed external hemorrhoids.   Last labs from 10/12/2023 creatinine 1.61 AST 50 ALT 42 hemoglobin 13 platelet 119  Last ultrasound 11/2023 Negative with AFP 2.5  Hemoglobin A1c 6.0  IMPRESSION: 1. Cholelithiasis without sonographic evidence of acute cholecystitis. 2. Increased echotexture of the liver, nonspecific but can be seen in fatty infiltration of liver or liver cirrhosis.  Past Medical History: Past Medical History:   Diagnosis Date   Anemia    Cryptogenic cirrhosis (HCC)    Diabetes mellitus    GERD (gastroesophageal reflux disease)    Hypertension    Liver disease     Past Surgical History: Past Surgical History:  Procedure Laterality Date   COLONOSCOPY     INCISION AND DRAINAGE OF WOUND Right 12/28/2016   Procedure: IRRIGATION AND DEBRIDEMENT WOUND;  Surgeon: Brunilda Capra, MD;  Location: [facility];  Service: Orthopedics;  Laterality: Right;   IR RADIOLOGIST EVAL & MGMT  10/08/2018   LIVER BIOPSY     NERVE, TENDON AND ARTERY REPAIR Right 12/28/2016   Procedure: NERVE, TENDON AND ARTERY REPAIR;  Surgeon: Brunilda Capra, MD;  Location: [facility];  Service: Orthopedics;  Laterality: Right;   PERCUTANEOUS PINNING Right 12/28/2016   Procedure: PERCUTANEOUS PINNING EXTREMITY;  Surgeon: Brunilda Capra, MD;  Location: [facility];  Service: Orthopedics;  Laterality: Right;   TIPS PROCEDURE  2008   UPPER GASTROINTESTINAL ENDOSCOPY      Family History: Family History  Problem Relation Age of Onset   Healthy Mother    Diabetes Father    Heart disease Father    Healthy Sister    Healthy Sister     Social History: Social History   Tobacco Use  Smoking Status Every Day   Current packs/day: 1.00   Average packs/day: 1 pack/day for 42.9 years (42.9 ttl pk-yrs)   Types: Cigarettes   Start date: 01/23/1981  Smokeless Tobacco Never  Tobacco Comments   patient states that in the 40 years he has stopped smoking then start again   Social History   Substance and Sexual Activity  Alcohol  Use No   Alcohol/week: 0.0 standard drinks of alcohol   Social History   Substance and Sexual Activity  Drug Use No    Allergies: No Known Allergies  Medications: Current Outpatient Medications  Medication Sig Dispense Refill   ACCU-CHEK AVIVA PLUS test strip 1 each by Other route as needed.     amLODipine  (NORVASC ) 10 MG tablet Take 1 tablet (10 mg total) by mouth  at bedtime. 30 tablet 6   aspirin EC 81 MG tablet Take 81 mg by mouth daily.     carvedilol  (COREG ) 6.25 MG tablet Take 6.25 mg by mouth. One daily     cloNIDine (CATAPRES) 0.3 MG tablet Take 0.3 mg by mouth 3 (three) times daily.     furosemide  (LASIX ) 40 MG tablet Take 40 mg by mouth daily.     glimepiride  (AMARYL ) 4 MG tablet Take 4 mg by mouth daily before breakfast.     lactulose (CHRONULAC) 10 GM/15ML solution 3 tablespoons three times a day     losartan  (COZAAR ) 100 MG tablet Take 100 mg by mouth daily.     omeprazole (PRILOSEC) 20 MG capsule TAKE 1 CAPSULE BY MOUTH EVERY MORNING FOR ACID REFLUX. 30 capsule 5   PRESCRIPTION MEDICATION Eye injection every 4 months     SURE COMFORT PEN NEEDLES 31G X 8 MM MISC      TRESIBA FLEXTOUCH 100 UNIT/ML FlexTouch Pen Inject 32 Units into the skin daily.     No current facility-administered medications for this visit.    Review of Systems: GENERAL: negative for malaise, night sweats HEENT: No changes in hearing or vision, no nose bleeds or other nasal problems. NECK: Negative for lumps, goiter, pain and significant neck swelling RESPIRATORY: Negative for cough, wheezing CARDIOVASCULAR: Negative for chest pain, leg swelling, palpitations, orthopnea GI: SEE HPI MUSCULOSKELETAL: Negative for joint pain or swelling, back pain, and muscle pain. SKIN: Negative for lesions, rash HEMATOLOGY Negative for prolonged bleeding, bruising easily, and swollen nodes. ENDOCRINE: Negative for cold or heat intolerance, polyuria, polydipsia and goiter. NEURO: negative for tremor, gait imbalance, syncope and seizures. The remainder of the review of systems is noncontributory.   Physical Exam: BP 129/66   Pulse 61   Temp 98.4 F (36.9 C) (Oral)   Ht 5' 10.5" (1.791 m)   Wt 249 lb 4.8 oz (113.1 kg)   BMI 35.27 kg/m  GENERAL: The patient is AO x3, in no acute distress. HEENT: Head is normocephalic and atraumatic. EOMI are intact. Mouth is well hydrated and  without lesions. NECK: Supple. No masses LUNGS: Clear to auscultation. No presence of rhonchi/wheezing/rales. Adequate chest expansion HEART: RRR, normal s1 and s2. ABDOMEN: Soft, nontender, no guarding, no peritoneal signs, and nondistended. BS +. No masses.  Imaging/Labs: MRI 2020 1. There are changes of cirrhosis with areas of hepatic fibrosis, as discussed above. The suspicious lesion in segment 2 of the liver has imaging characteristics most compatible with a flash fill cavernous hemangioma. Given the background of cirrhosis, follow-up MRI of the abdomen with and without IV gadolinium is recommended in 3-6 months to ensure the stability of this finding. 2. Moderate right pleural effusion lying dependently. 3. Small volume of ascites. 4. Splenomegaly.  Liver biopsy 2006  1) (Liver, biopsy):     CHRONIC ACTIVE INFLAMMATION.  INCREASED FIBROSIS.  SPECIAL     STAINS, IRON, RETICULUM, AND TRICHROME PENDING.      Latest Ref Rng & Units 10/13/2023   12:13 AM 11/26/2018   10:56 AM  11/03/2018    8:15 AM  CBC  WBC 4.0 - 10.5 K/uL 6.3  5.0  5.1   Hemoglobin 13.0 - 17.0 g/dL 36.6  44.0  34.7   Hematocrit 39.0 - 52.0 % 37.7  36.5  33.2   Platelets 150 - 400 K/uL 119  118  139    Lab Results  Component Value Date   IRON 281 (H) 10/15/2014   TIBC NOT CALC 10/15/2014   FERRITIN 545 (H) 10/15/2014    I personally reviewed and interpreted the available labs, imaging and endoscopic files.  Impression and Plan:  [name] [name] is a 68 y.o. male with has history of cryptogenic cirrhosis diagnosed in 2006 when he presented with refractory ascites and eventually went on to have TIPS and has done well. 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 Had liver biopsy in 2006  # Hepatic encephalopathy - Titrate lactulose to achieve 2-3 Bms per day  - Avoid  opiates or benzodiazepines   # Ascites; none on recent imaging , has history of TIPS  - Low sodium diet - Diuretics continue  # Esophageal varices - Last EGD 2009, patient is on carvedilol  which acts as primary prophylaxis.  We will repeat upper endoscopy for Barrett's screening in one time esophageal varices screening if none then patient may not need another screening upper endoscopy  # HCC screening - Last abdominal imaging 11/2023 with negative AFP Although patient had a liver lesion seen on MRI in 2020 and suggest repeat was 3 to 6 years which was not pursued  Recommendation   -Will obtain MRI abdomen to evaluate the liver lesion seen in 2020 MRI, also will evaluate TIPS patency - Check CBC, MELD labs and AFP in 6 months - Schedule liver US  , 6 months after MRI - Reduce salt intake to <2 g per day - Can take Tylenol  max of 2 g per day (650 mg q8h) for pain - Avoid NSAIDs for pain - Avoid eating raw oysters/shellfish - Continue furosemide  40 mg qday - Continue lactulose 45 g TID, goal 2-3 Bms per day - Patient is due for colonoscopy due to history of also scheduled -Given patient is reestablishing care for cirrhosis I will obtain one-time viral hepatitis profile again  All questions were answered.      Shaunika Italiano Faizan Charina Fons, MD Gastroenterology and Hepatology [facility]   This chart has been completed using [facility] Dictation software, and while attempts have been made to ensure accuracy , certain words and phrases may not be transcribed as intended

## 2023-12-03 NOTE — Patient Instructions (Addendum)
 It was very nice to meet you today, as dicussed with will plan for the following :  1) Take lactulose to titrate to 2-3 bowel movements daily  2) upper endoscopy and colonoscopy   3) lab work and MRI

## 2023-12-04 ENCOUNTER — Encounter (INDEPENDENT_AMBULATORY_CARE_PROVIDER_SITE_OTHER): Payer: Self-pay

## 2023-12-05 ENCOUNTER — Ambulatory Visit (HOSPITAL_COMMUNITY)
Admission: RE | Admit: 2023-12-05 | Discharge: 2023-12-05 | Disposition: A | Discharge status: Home / Self Care | Source: Ambulatory Visit | Attending: Gastroenterology | Admitting: Gastroenterology

## 2023-12-05 DIAGNOSIS — K7460 Unspecified cirrhosis of liver: Secondary | ICD-10-CM | POA: Insufficient documentation

## 2023-12-05 DIAGNOSIS — K769 Liver disease, unspecified: Secondary | ICD-10-CM | POA: Diagnosis present

## 2023-12-05 MED ORDER — GADOBUTROL 1 MMOL/ML IV SOLN
10.0000 mL | Freq: Once | INTRAVENOUS | Status: AC | PRN
  Administered 2023-12-05: 10 mL via INTRAVENOUS

## 2023-12-10 LAB — PROTIME-INR
INR: 1 (ref 0.9–1.2)
Prothrombin Time: 10.8 s (ref 9.1–12.0)

## 2023-12-10 LAB — HEPATITIS B SURFACE ANTIBODY,QUALITATIVE: Hep B Surface Ab, Qual: NONREACTIVE

## 2023-12-10 LAB — ANA: ANA Titer 1: NEGATIVE

## 2023-12-10 LAB — HEPATITIS B CORE ANTIBODY, TOTAL: Hep B Core Total Ab: NEGATIVE

## 2023-12-10 LAB — HEPATITIS A ANTIBODY, TOTAL: hep A Total Ab: POSITIVE — AB

## 2023-12-10 LAB — HEPATITIS B SURFACE ANTIGEN: Hepatitis B Surface Ag: NEGATIVE

## 2023-12-10 LAB — HEPATITIS C ANTIBODY: Hep C Virus Ab: NONREACTIVE

## 2023-12-18 ENCOUNTER — Ambulatory Visit (INDEPENDENT_AMBULATORY_CARE_PROVIDER_SITE_OTHER): Payer: Self-pay | Admitting: Gastroenterology

## 2023-12-18 NOTE — Progress Notes (Signed)
 Spoke to the patient in detail over the phone about MRI finding of LIRAD IV liver lesion suggestive of new hepatocellular carcinoma  There is also another small another LIRAD 3 lesion  Patient has history of refractory ascites but after TIPS his ascites has been much controlled and mostly is compensated  Patient agrees for referral to [facility] hepatology at [facility] and our oncology for further management of hepatocellular carcinoma  Recent blood work was also reviewed with the patient  ----------------------  Lorelee Roger , can you please (URGENT) :  1)refer this patient to [facility] hepatology at [facility] urgent referral for cirrhosis and new hepatocellular carcinoma  2)Refer to our oncologist for hepatocellular carcinoma  3) send recent MRI and this note to patient's PCP Artemisa Bile MD  Rudy Costain Amalia Badder,  Can you please schedule a follow up appointment for this patient in 6-8 weeks with me?  Thanks,  Kiannah Grunow Faizan Dealie Koelzer , MD Gastroenterology and Hepatology [facility]  ===========  MRI  IMPRESSION: 1. Diffusion restricting, arterially hyperenhancing lesion in the right lobe of the liver, hepatic segment VI/VII with washout and capsular enhancement, measuring 2.4 x 1.8 cm, with a peripheral region of wedge shaped hyperemia. LI-RADS category 5, consistent with hepatocellular carcinoma. 2. Additional tiny arterially hyperenhancing lesion of hepatic segment III, measuring 0.4 cm, LI-RADS category 3, intermediate suspicion. Attention on follow-up. 3. Cirrhosis and mild splenomegaly. Right hepatic vein TIPS. 4. Cholelithiasis.   Recent labs  Hepatitis A immune Hepatitis B not exposed nonimmune Hep C negative INR 1.0 ANA negative  Would need AFP and Hep B vaccination

## 2023-12-25 ENCOUNTER — Inpatient Hospital Stay: Attending: Oncology | Admitting: Oncology

## 2023-12-25 ENCOUNTER — Inpatient Hospital Stay

## 2023-12-25 ENCOUNTER — Telehealth: Payer: Self-pay | Admitting: *Deleted

## 2023-12-25 ENCOUNTER — Encounter: Payer: Self-pay | Admitting: Oncology

## 2023-12-25 VITALS — BP 182/75 | HR 51 | Temp 96.1°F | Resp 20 | Ht 69.49 in | Wt 252.2 lb

## 2023-12-25 DIAGNOSIS — C220 Liver cell carcinoma: Secondary | ICD-10-CM | POA: Insufficient documentation

## 2023-12-25 DIAGNOSIS — E119 Type 2 diabetes mellitus without complications: Secondary | ICD-10-CM | POA: Diagnosis not present

## 2023-12-25 DIAGNOSIS — Z7984 Long term (current) use of oral hypoglycemic drugs: Secondary | ICD-10-CM | POA: Diagnosis not present

## 2023-12-25 DIAGNOSIS — Z794 Long term (current) use of insulin: Secondary | ICD-10-CM | POA: Diagnosis not present

## 2023-12-25 DIAGNOSIS — F17210 Nicotine dependence, cigarettes, uncomplicated: Secondary | ICD-10-CM | POA: Diagnosis not present

## 2023-12-25 DIAGNOSIS — Z79899 Other long term (current) drug therapy: Secondary | ICD-10-CM | POA: Diagnosis not present

## 2023-12-25 DIAGNOSIS — I10 Essential (primary) hypertension: Secondary | ICD-10-CM | POA: Insufficient documentation

## 2023-12-25 LAB — CBC WITH DIFFERENTIAL/PLATELET
Abs Immature Granulocytes: 0.02 10*3/uL (ref 0.00–0.07)
Basophils Absolute: 0.1 10*3/uL (ref 0.0–0.1)
Basophils Relative: 1 %
Eosinophils Absolute: 0.5 10*3/uL (ref 0.0–0.5)
Eosinophils Relative: 9 %
HCT: 38 % — ABNORMAL LOW (ref 39.0–52.0)
Hemoglobin: 12.7 g/dL — ABNORMAL LOW (ref 13.0–17.0)
Immature Granulocytes: 0 %
Lymphocytes Relative: 32 %
Lymphs Abs: 1.8 10*3/uL (ref 0.7–4.0)
MCH: 31.6 pg (ref 26.0–34.0)
MCHC: 33.4 g/dL (ref 30.0–36.0)
MCV: 94.5 fL (ref 80.0–100.0)
Monocytes Absolute: 0.7 10*3/uL (ref 0.1–1.0)
Monocytes Relative: 13 %
Neutro Abs: 2.5 10*3/uL (ref 1.7–7.7)
Neutrophils Relative %: 45 %
Platelets: 107 10*3/uL — ABNORMAL LOW (ref 150–400)
RBC: 4.02 MIL/uL — ABNORMAL LOW (ref 4.22–5.81)
RDW: 13.7 % (ref 11.5–15.5)
WBC: 5.7 10*3/uL (ref 4.0–10.5)
nRBC: 0 % (ref 0.0–0.2)

## 2023-12-25 LAB — COMPREHENSIVE METABOLIC PANEL WITH GFR
ALT: 33 U/L (ref 0–44)
AST: 43 U/L — ABNORMAL HIGH (ref 15–41)
Albumin: 3.1 g/dL — ABNORMAL LOW (ref 3.5–5.0)
Alkaline Phosphatase: 139 U/L — ABNORMAL HIGH (ref 38–126)
Anion gap: 7 (ref 5–15)
BUN: 22 mg/dL (ref 8–23)
CO2: 25 mmol/L (ref 22–32)
Calcium: 8.8 mg/dL — ABNORMAL LOW (ref 8.9–10.3)
Chloride: 107 mmol/L (ref 98–111)
Creatinine, Ser: 1.5 mg/dL — ABNORMAL HIGH (ref 0.61–1.24)
GFR, Estimated: 51 mL/min — ABNORMAL LOW (ref >60)
Glucose, Bld: 84 mg/dL (ref 70–99)
Potassium: 4.2 mmol/L (ref 3.5–5.1)
Sodium: 139 mmol/L (ref 135–145)
Total Bilirubin: 1.6 mg/dL — ABNORMAL HIGH (ref 0.0–1.2)
Total Protein: 7.2 g/dL (ref 6.5–8.1)

## 2023-12-25 NOTE — Patient Instructions (Signed)
 VISIT SUMMARY:  You came in today for further evaluation of a liver lesion that is suspicious for cancer. You have a history of liver cirrhosis, diabetes, and gallstones. You are currently managing your daily activities well and have a history of smoking, with attempts to quit in the past.  YOUR PLAN:  -LIVER CANCER: The MRI suggests that you may have liver cancer that is currently confined to the liver. This means that the cancer has not spread to other parts of your body. We have several treatment options available, including surgery, local chemotherapy, and radiation therapy. We will refer you to a liver surgeon to see if you are a candidate for surgery. Additionally, we will order a CT scan of your chest to ensure the cancer has not spread and a blood test to check for a liver cancer marker. If surgery is not an option, we will refer you to interventional radiology for local chemotherapy, and consider radiation therapy if other treatments are not suitable.  -LIVER CIRRHOSIS: Liver cirrhosis is a condition where the liver is scarred and permanently damaged. You have had this condition for 20 years, likely due to fatty liver disease. There are no current symptoms or complications related to this condition.   -NICOTINE DEPENDENCE: Nicotine dependence means you are addicted to smoking. You have expressed interest in quitting smoking, which is beneficial for your overall health.  INSTRUCTIONS:  1. Follow up with the liver surgeon for an evaluation of your surgical candidacy. 2. Complete the CT scan of your chest to check for any spread of the cancer. 3. Get the blood test for the AFP liver cancer marker. 4. If you are not a candidate for surgery, follow up with interventional radiology for local chemotherapy. 5. Consider radiation therapy if other treatments are not suitable.

## 2023-12-25 NOTE — Progress Notes (Signed)
 Hematology-Oncology Clinic Note  Artemisa Bile, MD   Reason for Referral: Hepatocellular carcinoma  Oncology History: I have reviewed his chart and materials related to his cancer extensively and collaborated history with the patient. Summary of oncologic history is as follows:  Oncology History  Hepatocellular carcinoma (HCC)  12/05/2023 Imaging   MR abdomen W WO contrast:  IMPRESSION: 1. Diffusion restricting, arterially hyperenhancing lesion in the right lobe of the liver, hepatic segment VI/VII with washout and capsular enhancement, measuring 2.4 x 1.8 cm, with a peripheral region of wedge shaped hyperemia. LI-RADS category 5, consistent with hepatocellular carcinoma. 2. Additional tiny arterially hyperenhancing lesion of hepatic segment III, measuring 0.4 cm, LI-RADS category 3, intermediate suspicion. Attention on follow-up. 3. Cirrhosis and mild splenomegaly. Right hepatic vein TIPS. 4. Cholelithiasis.   12/25/2023 Initial Diagnosis   Hepatocellular carcinoma (HCC)   12/25/2023 Cancer Staging   Staging form: Liver, AJCC 8th Edition - Clinical stage from 12/25/2023: Stage II (cT2, cN0, cM0) - Signed by Eduardo Grade, MD on 12/25/2023 Stage prefix: Initial diagnosis       History of Presenting Illness: [name] [name] 68 y.o. male is here for hepatocellular carcinoma.  He is accompanied by his sister today.  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  As a part of liver cirrhosis screening had an MRI of abdomen done which showed a lesion consistent with hepatocellular carcinoma.  Patient has no complaints today.No current symptoms such as pain, nausea, vomiting, or bleeding related to cirrhosis are present. He remains active, managing daily activities like mowing the yard and caring for chickens and beehives, although he does not cook or clean.  Overall, he is of good functional status.  He smokes and has attempted to quit  multiple times, having succeeded for three years and two years in the past.  He does not drink alcohol.  Has no family history of cancer.  He lives alone in [place] but receives help with some activities.   Medical History: Past Medical History:  Diagnosis Date   Anemia    Cryptogenic cirrhosis (HCC)    Diabetes mellitus    GERD (gastroesophageal reflux disease)    Hypertension    Liver disease     Surgical history: Past Surgical History:  Procedure Laterality Date   COLONOSCOPY     INCISION AND DRAINAGE OF WOUND Right 12/28/2016   Procedure: IRRIGATION AND DEBRIDEMENT WOUND;  Surgeon: Brunilda Capra, MD;  Location: [facility];  Service: Orthopedics;  Laterality: Right;   IR RADIOLOGIST EVAL & MGMT  10/08/2018   LIVER BIOPSY     NERVE, TENDON AND ARTERY REPAIR Right 12/28/2016   Procedure: NERVE, TENDON AND ARTERY REPAIR;  Surgeon: Brunilda Capra, MD;  Location: [facility];  Service: Orthopedics;  Laterality: Right;   PERCUTANEOUS PINNING Right 12/28/2016   Procedure: PERCUTANEOUS PINNING EXTREMITY;  Surgeon: Brunilda Capra, MD;  Location: [facility];  Service: Orthopedics;  Laterality: Right;   TIPS PROCEDURE  2008   UPPER GASTROINTESTINAL ENDOSCOPY       Allergies:  has no known allergies.  Medications:  Current Outpatient Medications  Medication Sig Dispense Refill   ACCU-CHEK AVIVA PLUS test strip 1 each by Other route as needed.     amLODipine  (NORVASC ) 10 MG tablet Take 1 tablet (10 mg total) by mouth at bedtime. 30 tablet 6   aspirin EC 81 MG tablet Take 81 mg by mouth daily.  carvedilol  (COREG ) 6.25 MG tablet Take 6.25 mg by mouth. One daily     cloNIDine (CATAPRES) 0.3 MG tablet Take 0.3 mg by mouth 3 (three) times daily.     furosemide  (LASIX ) 40 MG tablet Take 40 mg by mouth daily.     glimepiride  (AMARYL ) 4 MG tablet Take 4 mg by mouth daily before breakfast.     lactulose (CHRONULAC) 10 GM/15ML solution 3 tablespoons  three times a day     losartan  (COZAAR ) 100 MG tablet Take 100 mg by mouth daily.     omeprazole (PRILOSEC) 20 MG capsule TAKE 1 CAPSULE BY MOUTH EVERY MORNING FOR ACID REFLUX. 30 capsule 5   PRESCRIPTION MEDICATION Eye injection every 4 months     SURE COMFORT PEN NEEDLES 31G X 8 MM MISC      TRESIBA FLEXTOUCH 100 UNIT/ML FlexTouch Pen Inject 32 Units into the skin daily.     No current facility-administered medications for this visit.    Review of Systems: Constitutional: Denies fevers, chills or abnormal night sweats Eyes: Denies blurriness of vision, double vision or watery eyes Ears, nose, mouth, throat, and face: Denies mucositis or sore throat Respiratory: Denies cough, dyspnea or wheezes Cardiovascular: Denies palpitation, chest discomfort or lower extremity swelling Gastrointestinal:  Denies nausea, heartburn or change in bowel habits Skin: Denies abnormal skin rashes Lymphatics: Denies new lymphadenopathy or easy bruising Neurological:Denies numbness, tingling or new weaknesses Behavioral/Psych: Mood is stable, no new changes  All other systems were reviewed with the patient and are negative.  Physical Examination: ECOG PERFORMANCE STATUS: 1 - Symptomatic but completely ambulatory  Vitals:   12/25/23 0958 12/25/23 1003  BP: (!) 188/81 (!) 182/75  Pulse: (!) 51   Resp: 20   Temp: (!) 96.1 F (35.6 C)   SpO2: 98%    Filed Weights   12/25/23 0958  Weight: 252 lb 3.2 oz (114.4 kg)    GENERAL:alert, no distress and comfortable LUNGS: clear to auscultation and percussion with normal breathing effort HEART: regular rate & rhythm and no murmurs and no lower extremity edema ABDOMEN:abdomen soft, non-tender and normal bowel sounds Musculoskeletal:no cyanosis of digits and no clubbing  PSYCH: alert & oriented x 3 with fluent speech  Laboratory Data: I have reviewed the data as listed Lab Results  Component Value Date   WBC 5.7 12/25/2023   HGB 12.7 (L) 12/25/2023    HCT 38.0 (L) 12/25/2023   MCV 94.5 12/25/2023   PLT 107 (L) 12/25/2023   Recent Labs    10/13/23 0013 12/25/23 1047  NA 140 139  K 4.1 4.2  CL 112* 107  CO2 20* 25  GLUCOSE 137* 84  BUN 27* 22  CREATININE 1.61* 1.50*  CALCIUM 8.8* 8.8*  GFRNONAA 47* 51*  PROT 7.4 7.2  ALBUMIN 3.2* 3.1*  AST 50* 43*  ALT 42 33  ALKPHOS 149* 139*  BILITOT 2.0* 1.6*  BILIDIR 0.6*  --   IBILI 1.4*  --     Latest Reference Range & Units 12/03/23 14:51  Prothrombin Time 9.1 - 12.0 sec 10.8  INR 0.9 - 1.2  1.0    Latest Reference Range & Units 12/03/23 14:51  Hep A Ab, Total Negative  Positive !  Hepatitis B Surface Ag Negative  Negative  Hep B Surface Ab, Qual  Non Reactive  Hep B Core Total Ab Negative  Negative  Hep C Virus Ab Non Reactive  Non Reactive  !: Data is abnormal Radiographic Studies: I have personally reviewed  the radiological images as listed and agreed with the findings in the report.  MR Abdomen W Wo Contrast CLINICAL DATA:  HCC screening, cirrhosis  EXAM: MRI ABDOMEN WITHOUT AND WITH CONTRAST  TECHNIQUE: Multiplanar multisequence MR imaging of the abdomen was performed both before and after the administration of intravenous contrast.  CONTRAST:  10mL GADAVIST  GADOBUTROL  1 MMOL/ML IV SOLN  COMPARISON:  MR abdomen, 10/22/2018  FINDINGS: Lower chest: No acute abnormality.  Hepatobiliary: Coarse, nodular cirrhotic morphology of the liver. Diffusion restricting, Arterially hyperenhancing lesion in the right lobe of the liver, hepatic segment VI/VII with washout and capsular enhancement on remaining multiphasic sequences, measuring 2.4 x 1.8 cm, with a peripheral region of wedge shaped hyperemia (series 16, image 35). Tiny arterially hyperenhancing lesion of hepatic segment III, measuring 0.4 cm (series 12, image 42). Small gallstones. No gallbladder wall thickening, or biliary dilatation. Patent appearing right hepatic vein TIPS.  Pancreas: Unremarkable. No  pancreatic ductal dilatation or surrounding inflammatory changes.  Spleen: Mild splenomegaly, maximum coronal span 13.8 cm.  Adrenals/Urinary Tract: Adrenal glands are unremarkable. Kidneys are normal, without renal calculi, solid lesion, or hydronephrosis.  Stomach/Bowel: Stomach is within normal limits. No evidence of bowel wall thickening, distention, or inflammatory changes.  Vascular/Lymphatic: Aortic atherosclerosis. No enlarged abdominal lymph nodes.  Other: No abdominal wall hernia or abnormality. No ascites.  Musculoskeletal: No acute or significant osseous findings.  IMPRESSION: 1. Diffusion restricting, arterially hyperenhancing lesion in the right lobe of the liver, hepatic segment VI/VII with washout and capsular enhancement, measuring 2.4 x 1.8 cm, with a peripheral region of wedge shaped hyperemia. LI-RADS category 5, consistent with hepatocellular carcinoma. 2. Additional tiny arterially hyperenhancing lesion of hepatic segment III, measuring 0.4 cm, LI-RADS category 3, intermediate suspicion. Attention on follow-up. 3. Cirrhosis and mild splenomegaly. Right hepatic vein TIPS. 4. Cholelithiasis.  Aortic Atherosclerosis (ICD10-I70.0).  Electronically Signed   By: Fredricka Jenny M.D.   On: 12/06/2023 21:38    ASSESSMENT & PLAN:  Patient is a 68 year old male with past medical history of cryptogenic cirrhosis s/p TIPS referred for hepatocellular carcinoma   Hepatocellular carcinoma (HCC) Patient has localized hepatocellular carcinoma in the setting of chronic liver cirrhosis. Child Pugh score of 7, class B  - Discussed with patient that the treatment for early-stage hepatocellular carcinoma is mostly locally directed therapies including surgery or liver transplant if he is a candidate for either or chemo arterial embolization.  If not a treatment for all of those, can consider radiation therapy to the liver lesion. -Will obtain CT chest to complete staging -  Will refer the patient to GI surgery in [facility] for evaluation of surgical candidacy - Will obtain labs today including CBC with differential, CMP and AFP - Continue to follow with GI and hepatology in [place] as scheduled  Return to clinic after appointment with surgery to discuss further management  Continuous dependence on cigarette smoking Patient is current smoking Trying to quit at this time  -Encouraged efforts to quit smoking.  Resources provided.   Orders Placed This Encounter  Procedures   CT Chest W Contrast    Standing Status:   Future    Expected Date:   12/25/2023    Expiration Date:   12/24/2024    If indicated for the ordered procedure, I authorize the administration of contrast media per Radiology protocol:   Yes    Does the patient have a contrast media/X-ray dye allergy?:   No    Preferred imaging location?:   [facility]   AFP tumor marker    Standing Status:   Future    Number of Occurrences:   1    Expected Date:   12/25/2023    Expiration Date:   12/24/2024   CBC with Differential/Platelet    Standing Status:   Future    Number of Occurrences:   1    Expected Date:   12/25/2023    Expiration Date:   12/24/2024   Comprehensive metabolic panel with GFR    Standing Status:   Future    Number of Occurrences:   1    Expected Date:   12/25/2023    Expiration Date:   12/24/2024    The total time spent in the appointment was 60 minutes encounter with patients including review of chart and various tests results, discussions about plan of care and coordination of care plan   All questions were answered. The patient knows to call the clinic with any problems, questions or concerns. No barriers to learning was detected.  Eduardo Grade, MD 5/21/20251:14 PM

## 2023-12-25 NOTE — Telephone Encounter (Signed)
 Patient came to the office to cancel procedure for next week. He wanted to wait before he rescheduled until after he see's the surgeon. This appt is currently pending. They will call once they are ready to reschedule.

## 2023-12-25 NOTE — Assessment & Plan Note (Addendum)
 Patient has localized hepatocellular carcinoma in the setting of chronic liver cirrhosis. Child Pugh score of 7, class B  - Discussed with patient that the treatment for early-stage hepatocellular carcinoma is mostly locally directed therapies including surgery or liver transplant if he is a candidate for either or chemo arterial embolization.  If not a treatment for all of those, can consider radiation therapy to the liver lesion. -Will obtain CT chest to complete staging - Will refer the patient to GI surgery in [facility] for evaluation of surgical candidacy - Will obtain labs today including CBC with differential, CMP and AFP - Continue to follow with GI and hepatology in [place] as scheduled  Return to clinic after appointment with surgery to discuss further management

## 2023-12-25 NOTE — Assessment & Plan Note (Signed)
 Patient is current smoking Trying to quit at this time  -Encouraged efforts to quit smoking.  Resources provided.

## 2023-12-26 LAB — AFP TUMOR MARKER: AFP, Serum, Tumor Marker: 2.6 ng/mL (ref 0.0–8.4)

## 2023-12-30 NOTE — Progress Notes (Signed)
 Primary Care Provider Davonna Siad, MD [phone] [place] [address]     Patient Profile: [name] [name] is a 68 y.o. male who presents to the [facility] at the request of Dr. Cinderella for a consultation for evaluation and management of newly diagnosed HCC.  History of Present Illness:   History of Present Illness [name] is a 68 year old male with cirrhosis and liver cancer who presents for evaluation of liver cancer treatment options. He is accompanied by his sister, who lives beside him and works with him. He was referred by Dr. Cinderella for evaluation of liver cancer.  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 Approximately 20 years ago, he underwent a TIPS procedure due to significant ascites, requiring paracentesis of 30 liters every two weeks. Post-TIPS, his condition improved significantly.  In 2020, an MRI for gallbladder issues incidentally noted a spot on the liver, felt to he hemagioma, lost to f/u, has had US  from PCP but no further MRI imaging since then until this spring.  He experienced a gallbladder rupture but did not undergo cholecystectomy. Instead, a tube was placed to manage gallstones.  Recently, he presented to the emergency room with confusion and balance issues.  Ammonia elevated and started on lactulose . Referred to Dr Cinderella. An MRI on Dec 05, 2023, revealed liver cancer. He was referred to [facility] to discuss curative options including LT.  He has a history of diabetes and hypertension, managed with medications including amlodipine , aspirin, carvedilol , clonidine, Lasix , Amaryl , Tresiba, and losartan . He recently started lactulose  for hyperammonemia, taking four tablespoons three times a day, which initially caused diarrhea but has since stabilized his bowel movements to two to three times daily.  He smokes one pack of cigarettes per day and has a family history of heart disease, with his father having died of a  heart attack and other family members having strokes or heart attacks.  He is currently disabled due to liver disease. He previously worked with [employer], which he continues to manage with his sister.  No recent balance issues since starting lactulose .  Results for orders placed during the hospital encounter of 12/26/23  MRI ABDOMINAL OUTSIDE INTERPRETATION  Narrative **INTERPRETATION OF OUTSIDE IMAGING**  INDICATIONS: K74.60 Unspecified cirrhosis of liver (CMS/HHS-HCC)  COMPARISONS: None  TECHNIQUE: - Patient full name: [name] - Date of Original Study: Dec 05, 2023 - Study: MR abdomen with and without contrast - Study Technique: MR abdomen with and without contrast   FINDINGS: Liver:  Cirrhotic configuration of the liver.  Focal hepatic lesions:  Observation 1: Arterial enhancing lesion in segment 5/6 with washout and enhancing capsule measuring approximately 2.4 x 1.8 cm (16/36). LI-RADS 5. Observation 2: Subcentimeter arterial enhancing lesion in segment 3 measuring 0.4 cm (12/42), LI-RADS 3.  Complications of chronic liver disease:  Splenomegaly.. Hepatic vascular anatomy:  Conventional arterial anatomy. No portal venous thrombosis. Biliary system:  Gallstones are present. No biliary ductal dilation.  Other organs, findings, etc.:  - Lower Thorax: No suspicious pulmonary abnormalities. No pleural or pericardial effusions.  - Spleen: Normal in appearance.  - Pancreas: Normal in appearance.  - Adrenal Glands: Normal in appearance.  - Kidneys: No suspicious renal lesions. No hydronephrosis. Too small to characterize hypodensities are present.  - Abdominal Vasculature: No abdominal aortic aneurysm.  - Gastrointestinal Tract: No abnormal dilation or wall thickening in the field of view.  - Peritoneum/Mesentery/Retroperitoneum: No  free fluid in the field of view.  - Lymph Nodes: No retroperitoneal or mesenteric lymphadenopathy.  - Body  Wall: Unremarkable.  - Musculoskeletal:  No aggressive appearing osseous lesions.  Impression: Hepatic findings: Cirrhotic configuration of the liver with findings of portal hypertension including splenomegaly Observation 1: LI-RADS 5 lesion in segment 5/6 Observation 2: Tiny LI RADS 3 lesion in segment 3. Extrahepatic findings: None.  _______________  NOTE:  This report utilizes LI-RADS version 2018, available online at [email or web address]. [email or web address]    Please note: Our interpretation of studies performed at an outside institution is limited by factors including absence of technical specifics of the image, undisclosed clinical information and the unavailability of the original interpretation.  Specialists at the institution that performed this study may have access to information not available to us  that could make a difference in the interpretation.  We suggest that you obtain the original interpretation from the site where the study was performed.  Electronically Signed by:  Edsel Isaacs, MD, [facility] Electronically Signed on:  12/27/2023 2:59 PM     General Review of Systems:  All other systems reviewed and negative except for as noted in the HPI  Past Medical History:  Diagnosis Date  . Cirrhosis of liver (CMS/HHS-HCC)   . Systemic hypertension   . Tobacco abuse   . Type 2 diabetes mellitus (CMS/HHS-HCC)     Past Surgical History:  Procedure Laterality Date  . COLONOSCOPY    . tips      Allergies: Patient has no allergy information on record.  Medications: Current Outpatient Medications  Medication Sig Dispense Refill  . amLODIPine  (NORVASC ) 10 MG tablet Take 1 tablet by mouth once daily    . aspirin 81 MG EC tablet Take 81 mg by mouth once daily    . carvediloL  (COREG ) 6.25 MG tablet Take 6.25 mg by mouth every morning    . cloNIDine HCL (CATAPRES) 0.3 MG tablet Take 0.3 mg by mouth 3 (three) times  a day    . FUROsemide  (LASIX ) 40 MG tablet Take 40 mg by mouth once daily    . glimepiride  (AMARYL ) 4 MG tablet Take 4 mg by mouth daily with breakfast    . lactulose  (ENULOSE ) 10 gram/15 mL oral solution Take 15 mLs by mouth once daily    . losartan  (COZAAR ) 100 MG tablet Take 100 mg by mouth once daily    . omeprazole (PRILOSEC) 20 MG DR capsule Take 20 mg by mouth once daily    . TRESIBA FLEXTOUCH U-100 pen injector (concentration 100 units/mL) Inject 32 Units subcutaneously once daily     No current facility-administered medications for this visit.    Social History: He  reports that he has been smoking cigarettes. He has never used smokeless tobacco. He reports that he does not currently use alcohol. He reports that he does not use drugs.  Family History: His family history includes Ischemic heart disease in his father.  Physical Examination: Vitals:   12/31/23 1138  BP: (!) 163/73  Pulse: 63  Resp:   Temp:    Body mass index is 37.01 kg/m. Wt Readings from Last 3 Encounters:  12/31/23 (!) 114 kg (251 lb 5.2 oz)   General:  The patient is awake, alert, oriented & in no acute distress.   Psych: The mood & affect are noted to be appropriate. Head & Neck:  The neck is supple, there is no LAD or JVD.  The sclera are not injected nor icteric.  Lungs:  Occasional wheeze BL CV:  Regular, rate, and rhythm. No murmurs, rubs or gallops appreciated.  Abdomen:  Soft, non tender, non distended, positive bowel sounds.  +splenomegaly There is no ascites.   Extremities:  There is 1+ edema Skin:  There is no rash.  There were no spider angiomata. Neuro:  Grossly intact.  The patient is able to stand easily.  No assistance was needed on or off of the exam table.  Affect and speech normal.  No asterixis.    Labs and Studies: No visits with results within 1 Day(s) from this visit.  Latest known visit with results is:  No results found for any previous visit.   Results LABS Cr: 1.5  (12/25/2023) Alpha fetoprotein: Normal (12/25/2023) WBC 5.7 PLT 107 HGB 12.7 AST 43 ALT 33 ALP 139 TB 1.6  RADIOLOGY MRI: Liver cancer (12/05/2023) see above Head CT: No acute infarct    Assessment & Plan: Mr. Axel is a 68 y.o. 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 See below discussion. Pt prefers not to proceed w/LT. I do not favor surgery given his low plt, splenomegaly and recent likely HE. He may be candidate for ablation per VIR.  Assessment & Plan Liver cancer Liver cancer confirmed on MRI with a 2.4 cm lesion isolated to the liver. Underlying cirrhosis complicates surgical options, esp with recent hepatic encephalopathy and portal htn. A liver transplant is the most curative option but requires smoking cessation and a comprehensive evaluation for surgical suitability. Local therapies, such as radiation beads or heat therapy, are alternatives but may be less curative. He prefers local therapy over transplant. - Complete local chest CT to assess for pulmonary metastasis. - Discussed fine to do local therapy here or in [place]. He was able to see VIR in clinic with me today and may be ablation candidate pending chest CT. - I do not recommend surgical resection given hx of recent HE and plt 107 and hx tips/portal htn. - Had risk/benefit discussion of txp, what would be needed for future txp should recur, benefit of txp, etc. He does not want to pursue LT.   Hepatic encephalopathy Confusion and balance issues due to elevated ammonia levels improved with lactulose  therapy. Risk of encephalopathy may increase with local liver therapies if healthy liver tissue is affected. - Continue lactulose  therapy t  Type 2 diabetes mellitus Diabetes managed by primary care provider with Amaryl  and [place]. 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  Hypertension Hypertension managed by primary care provider with amlodipine , carvedilol , clonidine, and losartan .  Tobacco use Current smoker, one pack per day. Smoking cessation required for liver transplant consideration, but he is not interested in transplant at this time though would benefit from cessation.  Goals of Care Prefers to avoid liver transplant and pursue local therapy for liver cancer, prioritizing quality of life over aggressive treatment.      Attestation Statement:   I personally performed the service. (TP)  LINDSAY SAMMUEL NOVAK, MD

## 2023-12-31 ENCOUNTER — Encounter (HOSPITAL_COMMUNITY)

## 2024-01-01 ENCOUNTER — Ambulatory Visit (HOSPITAL_BASED_OUTPATIENT_CLINIC_OR_DEPARTMENT_OTHER)
Admission: RE | Admit: 2024-01-01 | Discharge: 2024-01-01 | Disposition: A | Discharge status: Home / Self Care | Source: Ambulatory Visit | Attending: Oncology | Admitting: Oncology

## 2024-01-01 DIAGNOSIS — C220 Liver cell carcinoma: Secondary | ICD-10-CM | POA: Insufficient documentation

## 2024-01-01 MED ORDER — IOHEXOL 300 MG/ML  SOLN
75.0000 mL | Freq: Once | INTRAMUSCULAR | Status: AC | PRN
Start: 2024-01-01 — End: 2024-01-01
  Administered 2024-01-01: 75 mL via INTRAVENOUS

## 2024-01-02 ENCOUNTER — Encounter (HOSPITAL_COMMUNITY): Admission: RE | Payer: Self-pay | Source: Home / Self Care

## 2024-01-02 ENCOUNTER — Ambulatory Visit (HOSPITAL_COMMUNITY): Admission: RE | Admit: 2024-01-02 | Source: Home / Self Care | Admitting: Gastroenterology

## 2024-01-02 SURGERY — COLONOSCOPY
Anesthesia: Choice

## 2024-01-06 ENCOUNTER — Encounter (INDEPENDENT_AMBULATORY_CARE_PROVIDER_SITE_OTHER): Payer: Medicare Other | Admitting: Ophthalmology

## 2024-01-14 ENCOUNTER — Inpatient Hospital Stay: Attending: Oncology | Admitting: Oncology

## 2024-01-14 DIAGNOSIS — C220 Liver cell carcinoma: Secondary | ICD-10-CM | POA: Insufficient documentation

## 2024-01-27 ENCOUNTER — Inpatient Hospital Stay (HOSPITAL_BASED_OUTPATIENT_CLINIC_OR_DEPARTMENT_OTHER): Admitting: Oncology

## 2024-01-27 VITALS — BP 137/46 | HR 57 | Temp 98.4°F | Resp 16 | Wt 245.8 lb

## 2024-01-27 DIAGNOSIS — K7469 Other cirrhosis of liver: Secondary | ICD-10-CM

## 2024-01-27 DIAGNOSIS — F17210 Nicotine dependence, cigarettes, uncomplicated: Secondary | ICD-10-CM | POA: Diagnosis not present

## 2024-01-27 DIAGNOSIS — C220 Liver cell carcinoma: Secondary | ICD-10-CM

## 2024-01-27 NOTE — Assessment & Plan Note (Signed)
 Patient is current smoking Trying to quit at this time  -Encouraged efforts to quit smoking.

## 2024-01-27 NOTE — Patient Instructions (Signed)
 VISIT SUMMARY:  You were seen today for an evaluation of your liver cancer and enlarged lymph nodes, as well as a pre-surgical assessment. You have a known diagnosis of liver cancer and are scheduled for a liver ablation procedure on July 1st. A recent CT scan showed enlarged lymph nodes, which are likely due to infection or inflammation rather than cancer spread. You have experienced a decrease in appetite and some weight loss since your last visit.  YOUR PLAN:  -LIVER CANCER: Liver cancer is a type of cancer that begins in the cells of your liver. You are scheduled for a liver ablation procedure on July 1st to treat this. The enlarged lymph nodes seen on your recent CT scan are likely due to infection or inflammation, not cancer spread. We will proceed with the liver ablation as planned, and you should have a follow-up appointment in four months. An MRI will be performed three months after the ablation to monitor your condition. If necessary, a CT scan of your chest may be done six months after the ablation, and a PET scan will be considered if there is any observed growth.   INSTRUCTIONS:  Proceed with the liver ablation on July 1st. Schedule a follow-up appointment in four months. An MRI should be performed three months after the ablation. If necessary, a CT scan of your chest may be done six months after the ablation, and a PET scan will be considered if there is any observed growth. Continue taking lactulose as prescribed and follow up with your hepatologist. Monitor your weight and appetite closely.

## 2024-01-27 NOTE — Assessment & Plan Note (Signed)
 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

## 2024-01-27 NOTE — Assessment & Plan Note (Addendum)
 Patient has localized hepatocellular carcinoma in the setting of chronic liver cirrhosis. Child Pugh score of 7, class B Patient declined liver transplant evaluation and is a poor candidate with portal hypertension.  Patient is scheduled for ablation on July 1. CT scan with some mediastinal lymphadenopathy but no evidence of metastasis AFP: 2.6  -Proceed with liver ablation in July 1st at [facility] -MRI abdomen and pelvis at [facility] 3 months after ablation.  If not done there, can be done here. -Will repeat a CT chest at 6 months point.  If there is mediastinal lymphadenopathy enlargement, will consider getting a biopsy -Repeat AFP at next clinic visit  -Return to clinic in 4 months.

## 2024-01-27 NOTE — Progress Notes (Signed)
 Patient Care Team: Sheryle Carwin, MD as PCP - General (Internal Medicine) Charls Pearla LABOR, MD (Inactive) as PCP - Cardiology (Cardiology)  Clinic Day:  01/27/2024  Referring physician: Sheryle Carwin, MD   CHIEF COMPLAINT:  CC: Hepatocellular carcinoma   ASSESSMENT & PLAN:   Assessment & Plan: [name] [name]  is a 68 y.o. male with hepatocellular carcinoma  Hepatocellular carcinoma (HCC) Patient has localized hepatocellular carcinoma in the setting of chronic liver cirrhosis. Child Pugh score of 7, class B Patient declined liver transplant evaluation and is a poor candidate with portal hypertension.  Patient is scheduled for ablation on July 1. CT scan with some mediastinal lymphadenopathy but no evidence of metastasis AFP: 2.6  -Proceed with liver ablation in July 1st at [facility] -MRI abdomen and pelvis at [facility] 3 months after ablation.  If not done there, can be done here. -Will repeat a CT chest at 6 months point.  If there is mediastinal lymphadenopathy enlargement, will consider getting a biopsy -Repeat AFP at next clinic visit  -Return to clinic in 4 months.  Continuous dependence on cigarette smoking Patient is current smoking Trying to quit at this time  -Encouraged efforts to quit smoking.   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  He knows to contact our office if he develops concerns prior to his next appointment.  I provided 30 minutes of face-to-face time during this encounter and > 50% was spent counseling as documented under my assessment and plan.    Mickiel Dry, MD  [facility] [place] - A DEPT OF [facility] [address] [place] [address] Dept: [phone] Dept Fax: [phone]   No orders of the defined types were  placed in this encounter.    ONCOLOGY HISTORY:   Oncology History  Hepatocellular carcinoma (HCC)  12/05/2023 Imaging   MR abdomen W WO contrast:  IMPRESSION: 1. Diffusion restricting, arterially hyperenhancing lesion in the right lobe of the liver, hepatic segment VI/VII with washout and capsular enhancement, measuring 2.4 x 1.8 cm, with a peripheral region of wedge shaped hyperemia. LI-RADS category 5, consistent with hepatocellular carcinoma. 2. Additional tiny arterially hyperenhancing lesion of hepatic segment III, measuring 0.4 cm, LI-RADS category 3, intermediate suspicion. Attention on follow-up. 3. Cirrhosis and mild splenomegaly. Right hepatic vein TIPS. 4. Cholelithiasis.   12/25/2023 Initial Diagnosis   Hepatocellular carcinoma (HCC)   12/25/2023 Cancer Staging   Staging form: Liver, AJCC 8th Edition - Clinical stage from 12/25/2023: Stage II (cT2, cN0, cM0) - Signed by Dry Mickiel, MD on 12/25/2023 Stage prefix: Initial diagnosis   01/01/2024 Imaging   CT chest with contrast:  IMPRESSION: Few mediastinal and left hilar prominent nodes. There are 2 nodes which are larger than 1 cm in short axis, left paratracheal and towards the AP window. Please correlate with any prior chest CT.   01/01/2024 Imaging   CT chest with contrast:  IMPRESSION: Few mediastinal and left hilar prominent nodes. There are 2 nodes which are larger than 1 cm in short axis, left paratracheal and towards the AP window. Please correlate with any prior chest CT.    Evidence of old granulomatous disease. Calcified left upper lobe nodule. Splenic granulomas. There are several tiny sub 5 mm nodules in the right lung. Again recommend comparison  to prior or follow up in 12 months or sooner as per the patient's neoplasm surveillance.       Current Treatment: Ablation of the liver lesion  INTERVAL HISTORY:  [name] [name] is here today for follow up. Patient is accompanied by his sister today. He  denies fevers or chills. He denies pain. His appetite is good. His weight has decreased 7lbs  pounds over last 1 month.  He has no complaints today.  We reviewed the CT scan together and discussed the results in detail.  Patient is scheduled to get ablation done for his liver lesion on July 1 at [facility].  He is following with Dr. Myrna with hepatology.  He declined to proceed with liver transplant  I have reviewed the past medical history, past surgical history, social history and family history with the patient and they are unchanged from previous note.  ALLERGIES:  has no known allergies.  MEDICATIONS:  Current Outpatient Medications  Medication Sig Dispense Refill   ACCU-CHEK AVIVA PLUS test strip 1 each by Other route as needed.     amLODipine  (NORVASC ) 10 MG tablet Take 1 tablet (10 mg total) by mouth at bedtime. 30 tablet 6   aspirin EC 81 MG tablet Take 81 mg by mouth daily.     carvedilol  (COREG ) 6.25 MG tablet Take 6.25 mg by mouth 2 (two) times daily. One daily     cloNIDine (CATAPRES) 0.3 MG tablet Take 0.3 mg by mouth 3 (three) times daily.     furosemide  (LASIX ) 40 MG tablet Take 40 mg by mouth daily.     glimepiride  (AMARYL ) 4 MG tablet Take 4 mg by mouth daily before breakfast.     lactulose (CHRONULAC) 10 GM/15ML solution 3 tablespoons two times a day     losartan  (COZAAR ) 100 MG tablet Take 100 mg by mouth daily.     omeprazole (PRILOSEC) 20 MG capsule TAKE 1 CAPSULE BY MOUTH EVERY MORNING FOR ACID REFLUX. 30 capsule 5   PRESCRIPTION MEDICATION Eye injection every 4 months     SURE COMFORT PEN NEEDLES 31G X 8 MM MISC      TRESIBA FLEXTOUCH 100 UNIT/ML FlexTouch Pen Inject 32 Units into the skin daily.     No current facility-administered medications for this visit.    REVIEW OF SYSTEMS:   Constitutional: Denies fevers, chills or abnormal weight loss Eyes: Denies blurriness of vision Ears, nose, mouth, throat, and face: Denies mucositis or sore throat Respiratory: Denies  cough, dyspnea or wheezes Cardiovascular: Denies palpitation, chest discomfort or lower extremity swelling Gastrointestinal:  Denies nausea, heartburn or change in bowel habits Skin: Denies abnormal skin rashes Lymphatics: Denies new lymphadenopathy or easy bruising Neurological:Denies numbness, tingling or new weaknesses Behavioral/Psych: Mood is stable, no new changes  All other systems were reviewed with the patient and are negative.   VITALS:  Blood pressure (!) 137/46, pulse (!) 57, temperature 98.4 F (36.9 C), temperature source Oral, resp. rate 16, weight 245 lb 12.8 oz (111.5 kg), SpO2 98%.  Wt Readings from Last 3 Encounters:  01/27/24 245 lb 12.8 oz (111.5 kg)  12/25/23 252 lb 3.2 oz (114.4 kg)  12/03/23 249 lb 4.8 oz (113.1 kg)    Body mass index is 35.79 kg/m.  Performance status (ECOG): 0 - Asymptomatic  PHYSICAL EXAM:   GENERAL:alert, no distress and comfortable SKIN: skin color, texture, turgor are normal, no rashes or significant lesions LUNGS: clear to auscultation and percussion with normal breathing effort HEART: regular rate &  rhythm and no murmurs and no lower extremity edema ABDOMEN:abdomen soft, non-tender and normal bowel sounds Musculoskeletal:no cyanosis of digits and no clubbing  NEURO: alert & oriented x 3 with fluent speech  LABORATORY DATA:  I have reviewed the data as listed    Component Value Date/Time   NA 139 12/25/2023 1047   NA 140 11/03/2018 0815   K 4.2 12/25/2023 1047   CL 107 12/25/2023 1047   CO2 25 12/25/2023 1047   GLUCOSE 84 12/25/2023 1047   BUN 22 12/25/2023 1047   BUN 23 11/03/2018 0815   CREATININE 1.50 (H) 12/25/2023 1047   CREATININE 1.20 11/26/2018 1056   CALCIUM 8.8 (L) 12/25/2023 1047   PROT 7.2 12/25/2023 1047   PROT 7.1 11/03/2018 0815   ALBUMIN 3.1 (L) 12/25/2023 1047   ALBUMIN 3.4 (L) 11/03/2018 0815   AST 43 (H) 12/25/2023 1047   ALT 33 12/25/2023 1047   ALKPHOS 139 (H) 12/25/2023 1047   BILITOT 1.6 (H)  12/25/2023 1047   BILITOT 1.8 (H) 11/03/2018 0815   GFRNONAA 51 (L) 12/25/2023 1047   GFRAA 40 (L) 11/03/2018 0815    No results found for: SPEP, UPEP  Lab Results  Component Value Date   WBC 5.7 12/25/2023   NEUTROABS 2.5 12/25/2023   HGB 12.7 (L) 12/25/2023   HCT 38.0 (L) 12/25/2023   MCV 94.5 12/25/2023   PLT 107 (L) 12/25/2023      Chemistry      Component Value Date/Time   NA 139 12/25/2023 1047   NA 140 11/03/2018 0815   K 4.2 12/25/2023 1047   CL 107 12/25/2023 1047   CO2 25 12/25/2023 1047   BUN 22 12/25/2023 1047   BUN 23 11/03/2018 0815   CREATININE 1.50 (H) 12/25/2023 1047   CREATININE 1.20 11/26/2018 1056      Component Value Date/Time   CALCIUM 8.8 (L) 12/25/2023 1047   ALKPHOS 139 (H) 12/25/2023 1047   AST 43 (H) 12/25/2023 1047   ALT 33 12/25/2023 1047   BILITOT 1.6 (H) 12/25/2023 1047   BILITOT 1.8 (H) 11/03/2018 0815       RADIOGRAPHIC STUDIES: I have personally reviewed the radiological images as listed and agreed with the findings in the report.  CT Chest W Contrast Result Date: 01/02/2024 CLINICAL DATA:  Gynecomastia. EXAM: CT CHEST WITH CONTRAST TECHNIQUE: Multidetector CT imaging of the chest was performed during intravenous contrast administration. RADIATION DOSE REDUCTION: This exam was performed according to the departmental dose-optimization program which includes automated exposure control, adjustment of the mA and/or kV according to patient size and/or use of iterative reconstruction technique. CONTRAST:  75mL OMNIPAQUE  IOHEXOL  300 MG/ML  SOLN COMPARISON:  MRI abdomen 12/05/2023. FINDINGS: Cardiovascular: Heart is nonenlarged. Coronary calcifications are seen. There is some calcifications along the aortic valve. No pericardial effusion. The thoracic aorta has some scattered partially calcified atherosclerotic plaque. Mediastinum/Nodes: Slightly patulous thoracic esophagus. Small cystic area along the posterior aspect of the left thyroid  lobe measures 10 mm. Nonspecific. No specific abnormal lymph node enlargement identified in the axillary regions or right lung hilum. Small left hilar node identified on image 74 series 2 has short axis of 8 mm. There are several prominent mediastinal nodes. The vast majority are under a cm in short axis. Few nodes are more prominent. This includes along left side of the mediastinum near the AP window node measures 13 x 18 mm on image 57 of series 2. Left paratracheal node just above the carina measures 12  mm in short axis on image 63. These are abnormal. Few small pre cardiophrenic nodes are also seen. Lungs/Pleura: No consolidation, pneumothorax or effusion. There is some breathing motion. Linear opacity at the bases likely scar or atelectasis. There is a centrally calcified benign left upper lobe nodule abutting the pleura measuring 14 mm on image 27 of series 3 consistent with old granulomatous disease. Few tiny nodules are seen elsewhere such as 2 mm posterior right upper lobe on image 45, 3 mm right upper lobe on image 51. Similar punctate foci on image 64 and 65 in the right lung measuring 2-3 mm as well as image 72 and 73 in the inferior right upper lobe. No more dominant nodule identified this time. Upper Abdomen: Nodular heterogeneous liver identified consistent with known chronic liver disease. There is a tips shunt in place. Spleen is enlarged with splenic granulomas. Gallstones. Adrenal glands are preserved. Please correlate with the prior MRI. Musculoskeletal: Diffuse degenerative changes along the spine with bridging osteophytes and syndesmophytes diffusely. Multilevel disc height loss and Schmorl's node changes. Gynecomastia. IMPRESSION: Few mediastinal and left hilar prominent nodes. There are 2 nodes which are larger than 1 cm in short axis, left paratracheal and towards the AP window. Please correlate with any prior chest CT. Otherwise recommend either short follow-up CT in 3 months or further  workup with a PET-CT scan if there is more clinical concern at this time. Evidence of old granulomatous disease. Calcified left upper lobe nodule. Splenic granulomas. There are several tiny sub 5 mm nodules in the right lung. Again recommend comparison to prior or follow up in 12 months or sooner as per the patient's neoplasm surveillance. In the upper abdomen again note is made of chronic liver disease with a tips shunt. Please correlate with previous MRI. Gallstones. Coronary artery calcifications. Electronically Signed   By: Ranell Bring M.D.   On: 01/02/2024 18:37

## 2024-01-31 ENCOUNTER — Ambulatory Visit (INDEPENDENT_AMBULATORY_CARE_PROVIDER_SITE_OTHER): Admitting: Gastroenterology

## 2024-02-04 NOTE — Anesthesia Procedure Notes (Signed)
 Peripheral IV Placement 02/04/2024 1:56 PM  Additional IV was placed for medication or fluid administration or resuscitation. Prep:  chlorhexidine  Local:  none Gauge:  20 G Laterality/Location:  right  hand Attempts:  1 Dressing:  transparent adhesive dressing and tape  Procedure Staff: Performing Provider: Millicent Carmelita Prier, CRNA Authorizing Provider: Guinn, Nicole Renee, MD  Performed by: attending  I personally performed or jointly provided the services with an auxiliary provider (CRNA, RN, technician, etc.) but there was no involvement of a resident or fellow.   _____________________________________________________________________________

## 2024-02-04 NOTE — Anesthesia Procedure Notes (Signed)
 Airways Date/Time: 02/04/2024 1:52 PM Reason: elective  Airway not difficult  General Information and Staff  Patient location during procedure: OR  Procedure Staff: Performing Provider: Millicent Carmelita Prier, CRNA Authorizing Provider: Lyell Nat Fuller, MD  Performed by: CRNA   Indications and Patient Condition Indications for airway management: anesthesia Rapid Sequence Induction: No   Preoxygenated: yesMILS not maintained throughout  Mask difficulty assessment: 2 - vent by mask + Oral Airway/Adjunct  Final Airway Details  Final airway type: endotracheal airway  Endotracheal/SGA details:  ETT  Cuffed: yes  Successful intubation technique: direct laryngoscopy Endotracheal tube insertion site: oral Blade: .MacIntosh Blade size: #3 ETT size (mm): 7.5 Cormack-Lehane Classification: grade IIb - view of arytenoids or posterior of glottis only Placement verified by: chest auscultation and capnometry  Measured from: lips ETT to lips (cm): 23 Number of attempts at approach: 1 Ventilation between attempts: none Number of other approaches attempted: 0  Additional Comments Smooth,atraumatic intubation.

## 2024-02-04 NOTE — Anesthesia Postprocedure Evaluation (Signed)
 Discharge from Anesthesia Care  Patient: [name] [name]  Procedures performed: CT GUIDED ABLATION  Anesthesia type: general Vitals Value Taken Time  BP 137/53 02/04/24 15:45  Temp    Pulse 50 02/04/24 15:49  Resp 12 02/04/24 15:49  SpO2 95 % 02/04/24 15:49  Vitals shown include unfiled device data. *If vital value is absent from grid, please refer to corresponding flowsheet vitals data  Anesthesia Post Evaluation  Patient participation: complete - patient participated Level of consciousness: awake and alert Pain management: adequate Airway patency: patent Respiratory status: acceptable, spontaneous ventilation and nonlabored ventilation Cardiovascular status: acceptable and stable Hydration status: stable  Recent Flowsheet Information: No vitals data found for the desired time range.

## 2024-02-04 NOTE — Progress Notes (Signed)
 Anesthesia Transfer of Care Note  Patient: [name] [name]  Procedures performed: CT GUIDED ABLATION  Report given/handover to: PACU Nurse  Patient transported to PACU by anesthesia and surgical personnel; patient with spontaneous respirations and patent airway; pain medications immediately available; report given to receiving nurse; VSS

## 2024-02-04 NOTE — Procedures (Signed)
 [facility] Interventional Radiology Brief Op Note  Pre-procedure Diagnosis: HCC  Procedure: Microwave ablation  Medications: General anesthesia, endotracheal intubation.  Complications: None immediate.  EBL: none  Specimen Removed: No  Findings: 1. Aborted microwave ablation for the following reasons: The tumor could not be visualized under ultrasound, was poorly visualized under CT following contrast once contrast started washing out, central location near bile ducts and gallbladder, and proximity of lung for planned superior probe. Due to poor visualization it would be difficult to monitor the ablation zone and increase the risk for incomplete coverage of the lesion. We felt for these reasons the risks of attempting microwave ablation outweighed potential benefits.  Post-procedure Diagnosis: Same  Plan: 1. IR to reach out to patient to discuss alternative treatment options.  Interventional Fellow: Curlie; [id number]  Interventional Attending: Mabel Lope, MD

## 2024-02-19 ENCOUNTER — Encounter (INDEPENDENT_AMBULATORY_CARE_PROVIDER_SITE_OTHER): Payer: Self-pay | Admitting: Gastroenterology

## 2024-02-19 ENCOUNTER — Ambulatory Visit (INDEPENDENT_AMBULATORY_CARE_PROVIDER_SITE_OTHER): Admitting: Gastroenterology

## 2024-02-19 VITALS — BP 110/62 | HR 48 | Temp 97.7°F | Ht 71.0 in | Wt 238.7 lb

## 2024-02-19 DIAGNOSIS — K5900 Constipation, unspecified: Secondary | ICD-10-CM

## 2024-02-19 DIAGNOSIS — K5904 Chronic idiopathic constipation: Secondary | ICD-10-CM

## 2024-02-19 DIAGNOSIS — K7460 Unspecified cirrhosis of liver: Secondary | ICD-10-CM | POA: Diagnosis not present

## 2024-02-19 DIAGNOSIS — C220 Liver cell carcinoma: Secondary | ICD-10-CM

## 2024-02-19 MED ORDER — LACTULOSE 10 GM/15ML PO SOLN: 20.0000 g | Freq: Two times a day (BID) | ORAL | 3 refills | Status: DC

## 2024-02-19 MED ORDER — LINACLOTIDE 145 MCG PO CAPS: 145.0000 ug | ORAL_CAPSULE | Freq: Every day | ORAL | 1 refills | Status: AC

## 2024-02-19 NOTE — Progress Notes (Unsigned)
 [name] [name] , M.D. Gastroenterology & Hepatology [facility] Gastroenterology [address] [place], [place] [address] Primary Care Physician: Sheryle Carwin, MD [address] [place] [address]  Chief Complaint:  Cirrhosis , liver lesion   History of Present Illness: [name] [name] is a 68 y.o. male with has history of cryptogenic cirrhosis diagnosed in 2006 when he presented with refractory ascites and eventually went on to have TIPS, recently found to have [facility] who presents for evaluation of Cirrhosis complicated with [facility] s/p failed microwave ablation   Patient was seen by myself 12/2023 where an MRI was found to have [facility], was seen by Dr. Myrna transplant hepatologist eventually underwent microwave ablation which was which was not completed  Patient reports waiting to hear from Dr. Myrna today for next options for management of his [facility]  Patient main complaint remains altered bowel movements but mostly constipation which would last for few days despite taking lactulose   History Patient has history of perforated acute cholecystitis and subhepatic abscess.  Treated with cholecystostomy and IV Abx . He underwent abdominal CT on 10/08/2018 which revealed 16mm enhancing lesion in segment 3 concerning for hepatocellular carcinoma. 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   Patient had elevated ammonia last month and was restarted on lactulose .  Patient reports before starting on lactulose  he was having some dizziness which has resolved now.  .The patient denies having any nausea, vomiting, fever, chills, hematochezia, melena, hematemesis, abdominal distention, abdominal pain,  jaundice, pruritus or weight loss.  He had EGD and colonoscopy in April 2009. EGD was normal and colonoscopy revealed external hemorrhoids.   Last labs from 10/12/2023 creatinine 1.61 AST 50 ALT 42 hemoglobin 13 platelet  119  Last ultrasound 11/2023 Negative with AFP 2.5  Hemoglobin A1c 6.0  Past Medical History: Past Medical History:  Diagnosis Date   Anemia    Cryptogenic cirrhosis (HCC)    Diabetes mellitus    GERD (gastroesophageal reflux disease)    Hypertension    Liver disease     Past Surgical History: Past Surgical History:  Procedure Laterality Date   COLONOSCOPY     INCISION AND DRAINAGE OF WOUND Right 12/28/2016   Procedure: IRRIGATION AND DEBRIDEMENT WOUND;  Surgeon: Murrell Drivers, MD;  Location: [facility];  Service: Orthopedics;  Laterality: Right;   IR RADIOLOGIST EVAL & MGMT  10/08/2018   LIVER BIOPSY     NERVE, TENDON AND ARTERY REPAIR Right 12/28/2016   Procedure: NERVE, TENDON AND ARTERY REPAIR;  Surgeon: Murrell Drivers, MD;  Location: [facility];  Service: Orthopedics;  Laterality: Right;   PERCUTANEOUS PINNING Right 12/28/2016   Procedure: PERCUTANEOUS PINNING EXTREMITY;  Surgeon: Murrell Drivers, MD;  Location: [facility];  Service: Orthopedics;  Laterality: Right;   TIPS PROCEDURE  2008   UPPER GASTROINTESTINAL ENDOSCOPY      Family History: Family History  Problem Relation Age of Onset   Healthy Mother    Diabetes Father    Heart disease Father    Healthy Sister    Healthy Sister     Social History: Social History   Tobacco Use  Smoking Status Every Day   Current packs/day: 1.00   Average packs/day: 1 pack/day for 43.1 years (43.1 ttl pk-yrs)   Types: Cigarettes   Start date: 01/23/1981  Smokeless Tobacco Never  Tobacco Comments   patient states that in the  40 years he has stopped smoking then start again   Social History   Substance and Sexual Activity  Alcohol Use No   Alcohol/week: 0.0 standard drinks of alcohol   Social History   Substance and Sexual Activity  Drug Use No    Allergies: No Known Allergies  Medications: Current Outpatient Medications  Medication Sig Dispense Refill   ACCU-CHEK AVIVA  PLUS test strip 1 each by Other route as needed.     amLODipine  (NORVASC ) 10 MG tablet Take 1 tablet (10 mg total) by mouth at bedtime. 30 tablet 6   aspirin EC 81 MG tablet Take 81 mg by mouth daily.     carvedilol  (COREG ) 6.25 MG tablet Take 6.25 mg by mouth 2 (two) times daily. One daily     cloNIDine (CATAPRES) 0.3 MG tablet Take 0.3 mg by mouth 3 (three) times daily.     furosemide  (LASIX ) 40 MG tablet Take 40 mg by mouth daily.     glimepiride  (AMARYL ) 4 MG tablet Take 4 mg by mouth daily before breakfast.     lactulose  (CHRONULAC ) 10 GM/15ML solution 3 tablespoons two times a day     losartan  (COZAAR ) 100 MG tablet Take 100 mg by mouth daily.     omeprazole (PRILOSEC) 20 MG capsule TAKE 1 CAPSULE BY MOUTH EVERY MORNING FOR ACID REFLUX. 30 capsule 5   PRESCRIPTION MEDICATION Eye injection every 4 months     SURE COMFORT PEN NEEDLES 31G X 8 MM MISC      TRESIBA FLEXTOUCH 100 UNIT/ML FlexTouch Pen Inject 32 Units into the skin daily.     No current facility-administered medications for this visit.    Review of Systems: GENERAL: negative for malaise, night sweats HEENT: No changes in hearing or vision, no nose bleeds or other nasal problems. NECK: Negative for lumps, goiter, pain and significant neck swelling RESPIRATORY: Negative for cough, wheezing CARDIOVASCULAR: Negative for chest pain, leg swelling, palpitations, orthopnea GI: SEE HPI MUSCULOSKELETAL: Negative for joint pain or swelling, back pain, and muscle pain. SKIN: Negative for lesions, rash HEMATOLOGY Negative for prolonged bleeding, bruising easily, and swollen nodes. ENDOCRINE: Negative for cold or heat intolerance, polyuria, polydipsia and goiter. NEURO: negative for tremor, gait imbalance, syncope and seizures. The remainder of the review of systems is noncontributory.   Physical Exam: BP 110/62   Pulse (!) 48   Temp 97.7 F (36.5 C)   Ht 5' 11 (1.803 m)   Wt 238 lb 11.2 oz (108.3 kg)   BMI 33.29 kg/m   GENERAL: The patient is AO x3, in no acute distress. HEENT: Head is normocephalic and atraumatic. EOMI are intact. Mouth is well hydrated and without lesions. NECK: Supple. No masses LUNGS: Clear to auscultation. No presence of rhonchi/wheezing/rales. Adequate chest expansion HEART: RRR, normal s1 and s2. ABDOMEN: Soft, nontender, no guarding, no peritoneal signs, and nondistended. BS +. No masses.  MRI 2025  MRI   IMPRESSION: 1. Diffusion restricting, arterially hyperenhancing lesion in the right lobe of the liver, hepatic segment VI/VII with washout and capsular enhancement, measuring 2.4 x 1.8 cm, with a peripheral region of wedge shaped hyperemia. LI-RADS category 5, consistent with hepatocellular carcinoma. 2. Additional tiny arterially hyperenhancing lesion of hepatic segment III, measuring 0.4 cm, LI-RADS category 3, intermediate suspicion. Attention on follow-up. 3. Cirrhosis and mild splenomegaly. Right hepatic vein TIPS. 4. Cholelithiasis.  Imaging/Labs: MRI 2020 1. There are changes of cirrhosis with areas of hepatic fibrosis, as discussed above. The suspicious lesion in segment  2 of the liver has imaging characteristics most compatible with a flash fill cavernous hemangioma. Given the background of cirrhosis, follow-up MRI of the abdomen with and without IV gadolinium is recommended in 3-6 months to ensure the stability of this finding. 2. Moderate right pleural effusion lying dependently. 3. Small volume of ascites. 4. Splenomegaly.  Liver biopsy 2006  1) (Liver, biopsy):     CHRONIC ACTIVE INFLAMMATION.  INCREASED FIBROSIS.  SPECIAL     STAINS, IRON, RETICULUM, AND TRICHROME PENDING.      Latest Ref Rng & Units 12/25/2023   10:47 AM 10/13/2023   12:13 AM 11/26/2018   10:56 AM  CBC  WBC 4.0 - 10.5 K/uL 5.7  6.3  5.0   Hemoglobin 13.0 - 17.0 g/dL 87.2  86.9  87.3   Hematocrit 39.0 - 52.0 % 38.0  37.7  36.5   Platelets 150 - 400 K/uL 107  119  118    Lab  Results  Component Value Date   IRON 281 (H) 10/15/2014   TIBC NOT CALC 10/15/2014   FERRITIN 545 (H) 10/15/2014    I personally reviewed and interpreted the available labs, imaging and endoscopic files.  Hepatitis A immune Hepatitis B not exposed nonimmune Hep C negative INR 1.0 ANA negative  Impression and Plan:  TILFORD DEATON is a 68 y.o. male with has history of cryptogenic cirrhosis diagnosed in 2006 when he presented with refractory ascites and eventually went on to have TIPS, recently found to have 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 Had liver biopsy in 2006 Hep A immune, Hep B non immune, Hep C negative  # Hepatic encephalopathy - Titrate lactulose  to achieve 2-3 Bms per day  - Avoid opiates or benzodiazepines   # Ascites; none on recent imaging , has history of TIPS  - Low sodium diet - Diuretics continue  # Esophageal varices - Last EGD 2009, patient is on carvedilol  which acts as primary prophylaxis.  We will repeat upper endoscopy for Barrett's screening in one time after [facility] management as per patient wishes   Recommendation  -hep B vaccination - Check CBC, MELD labs - Reduce salt intake to <2 g per day - Can take Tylenol  max of 2 g per day (650 mg q8h) for pain - Avoid NSAIDs for pain - Avoid eating raw oysters/shellfish - Continue furosemide  40 mg qday - Continue lactulose  45 g TID, goal 2-3 Bms per day - Patient is due for colonoscopy -he would like to wait until management of HCC, we will readdress this next appointment   [facility]  Liver cancer confirmed on MRI with a 2.4 cm lesion isolated to the liver. LIRAD 5.  Patient underwent microwave ablation which was aborted  Patient is following with oncology and transplant hepatology(Dr Myrna)   Patient reports he is awaiting to hear back from transplant hepatology team today at 1030, I will also  reach out  Plan for MRI 3 months after treatment (for first 2 years )and possibly PET scan.  Then every 6 to 12 months thereafter  #Constipation   I will start patient on Linzess  145mcg and uptitrate as needed  All questions were answered.      Cortina Vultaggio Faizan Verner Mccrone, MD Gastroenterology and Hepatology [facility] Gastroenterology   This chart has been completed using [facility] Dictation software, and while attempts have been made to ensure  accuracy , certain words and phrases may not be transcribed as intended

## 2024-02-19 NOTE — Patient Instructions (Signed)
 It was very nice to meet you today, as dicussed with will plan for the following :  1) Linzess   Linzess  works best when taken once a day every day, on an empty stomach, at least 30 minutes before your first meal of the day.  Diarrhea, nausea or abdominal cramping may occur in the first 2 weeks -keep taking it.  These symptoms should eventually resolve. Please notify us  if having more than 4 watery bowel movements per day or fecal soiling accidents.

## 2024-02-20 ENCOUNTER — Telehealth (INDEPENDENT_AMBULATORY_CARE_PROVIDER_SITE_OTHER): Payer: Self-pay | Admitting: Gastroenterology

## 2024-02-20 DIAGNOSIS — K5904 Chronic idiopathic constipation: Secondary | ICD-10-CM | POA: Insufficient documentation

## 2024-02-20 NOTE — Telephone Encounter (Signed)
 Fax from [place] stating that our pharmacy has filled this prescription based on the circled instructions lactulose  (CHRONULAC ) 10 GM/15ML solution Medication Date: 02/19/2024 Department: [facility] Gastroenterology [facility] Ordering/Authorizing: Cinderella Deatrice FALCON, MD   Order Providers  Prescribing Provider Encounter Provider  Ahmed, Deatrice FALCON, MD Ahmed, Muhammad F, MD   Outpatient Medication Detail   Disp Refills Start End   lactulose  (CHRONULAC ) 10 GM/15ML solution 236 mL 3 02/19/2024 08/17/2024   Sig - Route: Take 30 mLs (20 g total) by mouth 2 (two) times daily. 3 tablespoons two times a day - Oral   Sent to pharmacy as: lactulose  (CHRONULAC ) 10 GM/15ML solution   E-Prescribing Status: Receipt confirmed by pharmacy (02/19/2024  9:37 AM EDT)   30 ml is actually 2 tablespoons (72ml-1tablespoon).   Fax also scanned under media.

## 2024-02-24 ENCOUNTER — Telehealth (INDEPENDENT_AMBULATORY_CARE_PROVIDER_SITE_OTHER): Payer: Self-pay

## 2024-02-24 NOTE — Telephone Encounter (Signed)
 I tried calling the patient back no answer and the VM is full.   Patient called the office today 02/24/2024, he left a VM on the nurses line stating he had been having issues with soiling himself and having some issues with memory and standing up, states ammonia levels had been elevated. He said he is having 4-7 loose stools per day and is soiling himself. Saw Dr. Cinderella on 02/19/2024.

## 2024-02-24 NOTE — Telephone Encounter (Signed)
 I spoke with the patient and made him aware per Dr. Eartha,    Please advise him to cut down on the Linzess .  May want to stop this for now and titrate lactulose  to achieve 2-3 bowel movements per day. He should keep us  updated about his mental status and follow-up in the clinic closely    Patient states understanding.

## 2024-02-24 NOTE — Telephone Encounter (Signed)
 I spoke with the patient and he says he is taking Lactulose  2 Tablespoons bid, He has not had a recent Ammonia level drawn. He is taking Linzess  145 mcg once per day. He is taking Furosemide  40 mg once per day. States he is not having any issue today with his memory or walking,but had a really bad spell with this yesterday. He denies any abdominal swelling, swelling in lower extremities, shortness of breath. He says he is soiling himself several times per day and having multiple loose stools per day up to seven times per day. Please advise. Thanks

## 2024-02-24 NOTE — Telephone Encounter (Signed)
 Please advise him to cut down on the Linzess .  May want to stop this for now and titrate lactulose  to achieve 2-3 bowel movements per day. He should keep us  updated about his mental status and follow-up in the clinic closely

## 2024-03-16 ENCOUNTER — Encounter (INDEPENDENT_AMBULATORY_CARE_PROVIDER_SITE_OTHER): Admitting: Ophthalmology

## 2024-03-16 DIAGNOSIS — I10 Essential (primary) hypertension: Secondary | ICD-10-CM

## 2024-03-16 DIAGNOSIS — H35033 Hypertensive retinopathy, bilateral: Secondary | ICD-10-CM

## 2024-03-16 DIAGNOSIS — Z794 Long term (current) use of insulin: Secondary | ICD-10-CM | POA: Diagnosis not present

## 2024-03-16 DIAGNOSIS — E113512 Type 2 diabetes mellitus with proliferative diabetic retinopathy with macular edema, left eye: Secondary | ICD-10-CM | POA: Diagnosis not present

## 2024-03-16 DIAGNOSIS — E113591 Type 2 diabetes mellitus with proliferative diabetic retinopathy without macular edema, right eye: Secondary | ICD-10-CM | POA: Diagnosis not present

## 2024-03-16 DIAGNOSIS — H43813 Vitreous degeneration, bilateral: Secondary | ICD-10-CM

## 2024-04-03 NOTE — Progress Notes (Signed)
 [facility] Department of Radiology [facility]  Follow Up Patient Visit after Radioembolization  Chief Complaint:  [facility]  Referring Clinician:   Myrna Manuelita Longs, MD [address] [place],  [place] [address]  History of Present Illness   [name] is a 68 y.o. male with PMH of DM, HTN, Cirrhosis complicated by portal HTN s/p TIPS about 20 years ago. In review of records, he had an MRI for gallbladder issues in 2020 and a liver mass was noted, but felt to be a hemangioma and was subsequently lost to follow up. More recently he was seen in the ED with confusion and balance issues, was found to have elevated ammonia and was started on lactulose . He was referred to a local gastroenterologist who ordered and MRI which was performed on 12/05/23 and showed a (per [facility] Interpretation) a 2.4x1.8 cm LIRADS 5 HCC in Segment 5/6. He was referred to [facility] Hepatology for discussion of transplantation, but has decided that he does not want to pursue liver transplant and prefers minimally invasive therapy. Given his prior TIPS he is not considered a good surgical candidate and was referred to Interventional Radiology for consideration of liver directed therapy.    He is s/p aborted MWA due to multiple technical factors on 02/04/24. Given the time since his last imaging we obtained a CT on 02/27/24 that showed the tumor to be not significantly changed from prior exam measuring 2.5x1.7cm.   We then transitioned to radioembolization and this was completed on 03/24/24.  Interval History: In discussion today Mr. [place] reports: Fatigue: did have fatigue and this persists - he's able to be up and do things in the morning but by the time the evening rolls around he's exhausted Appetite/Nasuea/Vomiting: decrease in appetite that is slowly improving, eating and drinking fluids, no n/v Pain: states that the catheter insertion site is fine, but he has pain in the left groin - he wonders if  this is related to a hernia - it is not related to the procedure Insertion Site: right sided insertion site well healed Symptoms of Hepatic Decompensation: denies   The patient's allergies, current medications, past family history, past medical history, past social history, past surgical history and problem list were reviewed and updated as appropriate. A 10 pt ROS was negative except as noted in the HPI.  Physical Exam   Physical Exam not done as this was a Telephone Visit.  Cognition Intact.   Lab Results   none  Imaging   03/24/24 Radioembolization with Dr. Danney IMPRESSION:  Successful yttrium-90 radioembolization for right hepatic hepatocellular carcinoma.    PLAN: Follow up MRI in approximately 3 months to assess treatment response.   Assessment and Plan  Mr. Dirocco is a 68 yo male with Cirrhosis complicated by portal hypertension s/p TIPS about 20 years ago now with [facility].  He is s/p aborted MWA due to multiple technical factors on 02/04/24. Given the time since his last imaging we obtained a CT on 02/27/24 that showed the tumor to be not significantly changed from prior exam measuring 2.5x1.7cm and plan for was radioembolization.  He is now s/p radioembolization as noted above which he tolerated as expected with fatigue and decreased appetite - slowly improving.  Plan: - IR treatment complete at this time but we are happy to see him in the future if needed. - MRI ordered and message sent to Dr. Myrna to schedule to coordinate with her follow up visit in 3-4 months.  This telephone encounter was conducted with the patient's (or proxy's) verbal consent via secure, interactive audio telecommunications while in clinic/office/hospital.  The patient (or proxy) was instructed to have this encounter in a suitably private space and to only have persons present to whom they give permission to participate. In addition, the patient identity was confirmed by use of name plus an additional  identifier.  This visit was coded based on medical decision making (MDM). Moderate medical decision making.   Attestation Statement:   I personally performed the service, non-incident to. (WP)   Melissa Linton Hurst, NP

## 2024-04-21 ENCOUNTER — Encounter (INDEPENDENT_AMBULATORY_CARE_PROVIDER_SITE_OTHER): Payer: Self-pay | Admitting: Gastroenterology

## 2024-04-21 ENCOUNTER — Ambulatory Visit (INDEPENDENT_AMBULATORY_CARE_PROVIDER_SITE_OTHER): Admitting: Gastroenterology

## 2024-04-21 VITALS — BP 130/68 | HR 56 | Temp 97.3°F | Ht 70.5 in | Wt 235.3 lb

## 2024-04-21 DIAGNOSIS — R110 Nausea: Secondary | ICD-10-CM

## 2024-04-21 DIAGNOSIS — C220 Liver cell carcinoma: Secondary | ICD-10-CM

## 2024-04-21 DIAGNOSIS — K7460 Unspecified cirrhosis of liver: Secondary | ICD-10-CM | POA: Diagnosis not present

## 2024-04-21 DIAGNOSIS — Z79899 Other long term (current) drug therapy: Secondary | ICD-10-CM

## 2024-04-21 NOTE — Progress Notes (Unsigned)
 Referring Provider: Sheryle Carwin, MD Primary Care Physician:  Sheryle Carwin, MD Primary GI Physician: Dr. Cinderella  Chief Complaint  Patient presents with   Follow-up    Patient here today for a follow up on Hepatic cirrhosis. Patient denies any new gi related issues.    HPI:   [name] [name] is a 68 y.o. male with past medical history of  cryptogenic cirrhosis diagnosed in 2006 when he presented with refractory ascites and eventually went on to have TIPS, recently found to have [facility]   Patient presenting today for:  Follow up of Cirrhosis and HCC   Last seen in July by DR. Ahmed,  main complaint was altered bowel movements but mostly constipation which would last for few days despite taking lactulose  elevated ammonia in june and was restarted on lactulose .  Patient reports before starting on lactulose  he was having some dizziness which had resolved.  Recommended titrated lactulose , low sodium diet,  EGD/colonoscopy after [facility] management per patient request, Hep B vaccine, MELD labs, CBC, continue lasix  40mg  daily, linzess  145mcg daily, MRI 3 months s/p HCC treatment (x2 years), and possible PET scan then every 6-12 months thereafter  -patient underwent IR tumor embolization Y90 treatment on 7/30  -again on 8/19 (Successful yttrium-90 radioembolization for right hepatic hepatocellular  Carcinoma) -Had radiation dosimetry on 8/11   Labs in July with plt count 83k AP 146 Albumin 3.1 AST 38 ALT 27 INR 1   Present: States he is doing okay today. Bowel movements are about twice per day, though had a few episodes of fecal incontinence last week. He has cut down on his lactulose  to about 3T every other day. He is not taking linzess  anymore on a regular basis but did take it once a few weeks ago when he had gone a few days without a BM. Denise any episodes of confusion, forgetfulness. Endorses generalized abdominal discomfort. He has some nausea at times after taking his lactulose  but does not vomit.  Denies any swelling to his abdomen or legs. Feels appetite is good. Has lost some weight, approximately 15 pounds since march of 2024. Does report he thinks he has had hepatitis B vaccinations   He has appt with liver specialist in early October, with MRI scheduled for 11/3  Seeing oncology at [facility] on 10/21   He is still wanting to hold off on EGD and Colonoscopy at this time.   Previous MELD 3.0: July 2025 25   Cirrhosis related questions: Episodes of confusion/disorientation: none  -Taking diuretics? Lasix  40mg  daily  -Beta blockers? Coreg  6.25mg  BID  -Prior history of variceal banding? None  -Prior episodes of SBP? None  -Last liver imaging: July 2025 CT cirrhosis abdomen Hepatic findings:  Cirrhotic liver morphology with sequela of portal hypertension including  splenomegaly. Patent TIPS.  Observation 1: Redemonstrated and unchanged LI-RADS 5 lesion in segment  5/6.  Observation 2: Not well visualized by CT.   Extrahepatic findings:  None.  -Last AFP: 2.6 12/2023 -Alcohol use: none   Pertinent history: -Seen by Dr. Cinderella 12/2023 where MRI was found to have [facility] -seen by Dr. Myrna transplant hepatologist eventually underwent microwave ablation which was not completed  -Patient has history of perforated acute cholecystitis and subhepatic abscess.  Treated with cholecystostomy and IV Abx .  -He underwent abdominal CT on 10/08/2018 which revealed 16mm enhancing lesion in segment 3 concerning for hepatocellular carcinoma.  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      EGD April 2009-normal  colonoscopy in April 2009.  external hemorrhoids.   Filed Weights   04/21/24 0944  Weight: 235 lb 4.8 oz (106.7 kg)     Past Medical History:  Diagnosis Date   Anemia    Cryptogenic cirrhosis (HCC)    Diabetes mellitus    GERD (gastroesophageal reflux disease)    Hypertension    Liver disease     Past Surgical History:  Procedure  Laterality Date   COLONOSCOPY     INCISION AND DRAINAGE OF WOUND Right 12/28/2016   Procedure: IRRIGATION AND DEBRIDEMENT WOUND;  Surgeon: Murrell Drivers, MD;  Location: [facility];  Service: Orthopedics;  Laterality: Right;   IR RADIOLOGIST EVAL & MGMT  10/08/2018   LIVER BIOPSY     NERVE, TENDON AND ARTERY REPAIR Right 12/28/2016   Procedure: NERVE, TENDON AND ARTERY REPAIR;  Surgeon: Murrell Drivers, MD;  Location: [facility];  Service: Orthopedics;  Laterality: Right;   PERCUTANEOUS PINNING Right 12/28/2016   Procedure: PERCUTANEOUS PINNING EXTREMITY;  Surgeon: Murrell Drivers, MD;  Location: [facility];  Service: Orthopedics;  Laterality: Right;   TIPS PROCEDURE  2008   UPPER GASTROINTESTINAL ENDOSCOPY      Current Outpatient Medications  Medication Sig Dispense Refill   ACCU-CHEK AVIVA PLUS test strip 1 each by Other route as needed.     amLODipine  (NORVASC ) 10 MG tablet Take 1 tablet (10 mg total) by mouth at bedtime. 30 tablet 6   carvedilol  (COREG ) 6.25 MG tablet Take 6.25 mg by mouth 2 (two) times daily. One daily     cloNIDine (CATAPRES) 0.3 MG tablet Take 0.3 mg by mouth 3 (three) times daily.     furosemide  (LASIX ) 40 MG tablet Take 40 mg by mouth daily.     glimepiride  (AMARYL ) 4 MG tablet Take 4 mg by mouth daily before breakfast.     lactulose  (CHRONULAC ) 10 GM/15ML solution Take 30 mLs (20 g total) by mouth 2 (two) times daily. 3 tablespoons two times a day 236 mL 3   linaclotide  (LINZESS ) 145 MCG CAPS capsule Take 1 capsule (145 mcg total) by mouth daily. 90 capsule 1   losartan  (COZAAR ) 100 MG tablet Take 100 mg by mouth daily.     omeprazole (PRILOSEC) 20 MG capsule TAKE 1 CAPSULE BY MOUTH EVERY MORNING FOR ACID REFLUX. 30 capsule 5   PRESCRIPTION MEDICATION Eye injection every 4 months     SURE COMFORT PEN NEEDLES 31G X 8 MM MISC      TRESIBA FLEXTOUCH 100 UNIT/ML FlexTouch Pen Inject 32 Units into the skin daily.     No current  facility-administered medications for this visit.    Allergies as of 04/21/2024   (No Known Allergies)    Social History   Socioeconomic History   Marital status: Single    Spouse name: Not on file   Number of children: Not on file   Years of education: Not on file   Highest education level: Not on file  Occupational History   Not on file  Tobacco Use   Smoking status: Every Day    Current packs/day: 1.00    Average packs/day: 1 pack/day for 43.2 years (43.2 ttl pk-yrs)    Types: Cigarettes    Start date: 01/23/1981   Smokeless tobacco: Never   Tobacco comments:    patient states that in the 40 years he has stopped smoking then start again  Vaping Use  Vaping status: Never Used  Substance and Sexual Activity   Alcohol use: No    Alcohol/week: 0.0 standard drinks of alcohol   Drug use: No   Sexual activity: Not on file  Other Topics Concern   Not on file  Social History Narrative   Not on file   Social Drivers of Health   Financial Resource Strain: Not on file  Food Insecurity: No Food Insecurity (12/25/2023)   Hunger Vital Sign    Worried About Running Out of Food in the Last Year: Never true    Ran Out of Food in the Last Year: Never true  Transportation Needs: No Transportation Needs (12/25/2023)   PRAPARE - Administrator, Civil Service (Medical): No    Lack of Transportation (Non-Medical): No  Physical Activity: Not on file  Stress: Not on file  Social Connections: Not on file    Review of systems General: negative for malaise, night sweats, fever, chills, weight los Neck: Negative for lumps, goiter, pain and significant neck swelling Resp: Negative for cough, wheezing, dyspnea at rest CV: Negative for chest pain, leg swelling, palpitations, orthopnea GI: denies melena, hematochezia, nausea, vomiting, diarrhea, constipation, dysphagia, odyonophagia, early satiety or unintentional weight loss.  MSK: Negative for joint pain or swelling, back  pain, and muscle pain. Derm: Negative for itching or rash Psych: Denies depression, anxiety, memory loss, confusion. No homicidal or suicidal ideation.  Heme: Negative for prolonged bleeding, bruising easily, and swollen nodes. Endocrine: Negative for cold or heat intolerance, polyuria, polydipsia and goiter. Neuro: negative for tremor, gait imbalance, syncope and seizures. The remainder of the review of systems is noncontributory.  Physical Exam: BP (!) 145/63 (BP Location: Left Arm, Patient Position: Sitting, Cuff Size: Large)   Pulse 60   Temp (!) 97.3 F (36.3 C) (Temporal)   Ht 5' 10.5 (1.791 m)   Wt 235 lb 4.8 oz (106.7 kg)   BMI 33.28 kg/m  General:   Alert and oriented. No distress noted. Pleasant and cooperative.  Head:  Normocephalic and atraumatic. Eyes:  Conjuctiva clear without scleral icterus. Mouth:  Oral mucosa pink and moist. Good dentition. No lesions. Heart: Normal rate and rhythm, s1 and s2 heart sounds present.  Lungs: Clear lung sounds in all lobes. Respirations equal and unlabored. Abdomen:  +BS, soft, non-tender and non-distended. No rebound or guarding. No HSM or masses noted. Derm: No palmar erythema or jaundice Msk:  Symmetrical without gross deformities. Normal posture. Extremities:  Without edema. Neurologic:  Alert and  oriented x4 Psych:  Alert and cooperative. Normal mood and affect.  Invalid input(s): 6 MONTHS   ASSESSMENT: [name] [name] is a 68 y.o. male presenting today    PLAN:  -continue lactulose , titrate for 2-3 BMs per day  -continue lasix  40mg  daily  -continue coreg  6.25mg  twice daily  -schedule EGD and Colonoscopy-pt deferred until after [facility] treatment  -continue to follow at [facility]/oncology for [facility] -Reduce salt intake to <2 g per day - Can take Tylenol  max of 2 g per day (650 mg q8h) for pain - Avoid NSAIDs for pain - Avoid eating raw oysters/shellfish - Ensure every night before going to sleep    Follow Up: 3 months    Nathan Moctezuma L. Alenah Sarria, MSN, APRN, AGNP-C Adult-Gerontology Nurse Practitioner [facility]

## 2024-04-21 NOTE — Patient Instructions (Signed)
-  continue lactulose , titrate for 2-3 BMs per day  -continue lasix  40mg  daily  -continue coreg  6.25mg  twice daily  -continue to follow at [facility]/oncology for [facility] -make sure to discuss hepatitis B vaccines with your PCP to ensure you have these  -Reduce salt intake to <2 g per day - Can take Tylenol  max of 2 g per day (650 mg q8h) for pain - Avoid NSAIDs for pain - Avoid eating raw oysters/shellfish - Ensure every night before going to sleep  Follow up 3 months

## 2024-05-11 ENCOUNTER — Telehealth (INDEPENDENT_AMBULATORY_CARE_PROVIDER_SITE_OTHER): Payer: Self-pay

## 2024-05-11 ENCOUNTER — Other Ambulatory Visit (INDEPENDENT_AMBULATORY_CARE_PROVIDER_SITE_OTHER): Payer: Self-pay | Admitting: Gastroenterology

## 2024-05-11 MED ORDER — LACTULOSE 10 GM/15ML PO SOLN: 30.0000 g | Freq: Every day | ORAL | 5 refills | Status: DC

## 2024-05-11 NOTE — Telephone Encounter (Signed)
 Thank you :)

## 2024-05-11 NOTE — Telephone Encounter (Signed)
 Would you mind sending this in please. I don't feel comfortable with this. Thanks,

## 2024-05-11 NOTE — Telephone Encounter (Signed)
 Per [facility] they need a clarification on the Lactulose  script. Patient last seen by [facility] on 04/21/2024, but Dr. Cinderella sent in a script for the patient's Lactulose  they say they have filled this for the patient in the past with the directions of 30 ml bid, but they have a new script that has conflicting directions. Per Hoy at [facility] they have a script there that says for the patient to take the Lactulose  30 ml (20 grams total) bid, then it says take 3 tablespoons bid (45 ml bid). Please advise. Which one is correct. Thanks

## 2024-05-12 NOTE — Telephone Encounter (Signed)
 I spoke with Penne at [facility] and made him aware per [facility], The Rx I sent should be for for 45ml once daily. There was a comment that the patient reported taking it different that it would not let me edit out, hence where the 3T came from.   He states understanding and will fill the 45 ml once per day for the patient.

## 2024-05-12 NOTE — Telephone Encounter (Signed)
 Meredith from [place] called back stating the script that was resubmitted is still conflicting with directions.   She says the script states to take Lactulose  45 ml daily, then down in the sentence structure it says for the patient to take 3 tablespoons bid.   They are needing clarification on whether the patient is to take 45 ml once per day,  Or if the patient is supposed to take 45 ml twice per day.   Please advise/ resubmit.   Thanks,

## 2024-05-26 ENCOUNTER — Inpatient Hospital Stay: Attending: Oncology | Admitting: Oncology

## 2024-05-26 VITALS — BP 121/54 | HR 52 | Temp 97.6°F | Resp 19 | Wt 235.0 lb

## 2024-05-26 DIAGNOSIS — D696 Thrombocytopenia, unspecified: Secondary | ICD-10-CM | POA: Insufficient documentation

## 2024-05-26 DIAGNOSIS — K7460 Unspecified cirrhosis of liver: Secondary | ICD-10-CM | POA: Diagnosis not present

## 2024-05-26 DIAGNOSIS — C220 Liver cell carcinoma: Secondary | ICD-10-CM | POA: Diagnosis not present

## 2024-05-26 DIAGNOSIS — D649 Anemia, unspecified: Secondary | ICD-10-CM | POA: Insufficient documentation

## 2024-05-26 DIAGNOSIS — K7469 Other cirrhosis of liver: Secondary | ICD-10-CM

## 2024-05-26 DIAGNOSIS — Z79899 Other long term (current) drug therapy: Secondary | ICD-10-CM | POA: Insufficient documentation

## 2024-05-26 DIAGNOSIS — F17210 Nicotine dependence, cigarettes, uncomplicated: Secondary | ICD-10-CM | POA: Insufficient documentation

## 2024-05-26 NOTE — Patient Instructions (Addendum)
 [facility] at [facility] Discharge Instructions   You were seen and examined today by Dr. Davonna.  We will see you back in 3 months.   Return as scheduled.    Thank you for choosing [facility] at [facility] to provide your oncology and hematology care.  To afford each patient quality time with our provider, please arrive at least 15 minutes before your scheduled appointment time.   If you have a lab appointment with the Cancer Center please come in thru the Main Entrance and check in at the main information desk.  You need to re-schedule your appointment should you arrive 10 or more minutes late.  We strive to give you quality time with our providers, and arriving late affects you and other patients whose appointments are after yours.  Also, if you no show three or more times for appointments you may be dismissed from the clinic at the providers discretion.     Again, thank you for choosing [facility].  Our hope is that these requests will decrease the amount of time that you wait before being seen by our physicians.       _____________________________________________________________  Should you have questions after your visit to [facility], please contact our office at [phone] and follow the prompts.  Our office hours are 8:00 a.m. and 4:30 p.m. Monday - Friday.  Please note that voicemails left after 4:00 p.m. may not be returned until the following business day.  We are closed weekends and major holidays.  You do have access to a nurse 24-7, just call the main number to the clinic [phone] and do not press any options, hold on the line and a nurse will answer the phone.    For prescription refill requests, have your pharmacy contact our office and allow 72 hours.    Due to Covid, you will need to wear a mask upon entering the hospital. If you do not have a mask, a mask will be given to you at the Main  Entrance upon arrival. For doctor visits, patients may have 1 support person age 46 or older with them. For treatment visits, patients can not have anyone with them due to social distancing guidelines and our immunocompromised population.

## 2024-05-26 NOTE — Progress Notes (Signed)
 Patient Care Team: Sheryle Carwin, MD as PCP - General (Internal Medicine) Charls Pearla LABOR, MD (Inactive) as PCP - Cardiology (Cardiology)  Clinic Day:  05/26/2024  Referring physician: Sheryle Carwin, MD   CHIEF COMPLAINT:  CC: Hepatocellular carcinoma   ASSESSMENT & PLAN:   Assessment & Plan: [name] [name]  is a 68 y.o. male with hepatocellular carcinoma  Assessment and Plan  Hepatocellular carcinoma Patient has localized hepatocellular carcinoma in the setting of chronic liver cirrhosis. Child Pugh score of 7, class B Patient declined liver transplant evaluation and is a poor candidate with portal hypertension.   CT scan with some mediastinal lymphadenopathy but no evidence of metastasis AFP: 4.3 (05/06/2024, labs from Care Everywhere)   - S/p Y90 ablation of the liver lesion on 03/24/2024 after an aborted attempt on 02/04/2024 due to technical difficulties. -Patient is following with hepatologist Dr. Myrna at [facility].  He is scheduled to have his CT scan and MRI next month. - Labs from Dr. Zorita office reviewed on Care Everywhere.  CBC: Hemoglobin: 11.3, WBC: 5.2, platelets: 98, CMP: Creatinine: 1.6, bilirubin: 2.0, alkaline phosphatase: 200, AST: 42, ALT: 27, AFP: 4.3 - Discussed with the patient that we can review Dr. Zorita images here on Care Everywhere.  If patient has good response to Y90, he needs frequent monitoring with scans and AFP.  If he does not have good response, can consider systemic therapy or even radiation.  Return to clinic in 3 months for reassessment.    Tobacco use Patient is a chronic smoker and.  Not trying to quit at this time.  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 Last iron panel showed elevated ferritin. Will continue to monitor at this time   The patient understands the plans discussed today and  is in agreement with them.  He knows to contact our office if he develops concerns prior to his next appointment.  The total time spent in the appointment was 30 minutes for the encounter with patient, including review of chart and various tests results, discussions about plan of care and coordination of care plan   Mickiel Dry, MD  [facility] [place] - A DEPT OF [facility] [address] [place] [address] Dept: [phone] Dept Fax: [phone]   No orders of the defined types were placed in this encounter.    ONCOLOGY HISTORY:   Oncology History  Hepatocellular carcinoma (HCC)  12/05/2023 Imaging   MR abdomen W WO contrast:  IMPRESSION: 1. Diffusion restricting, arterially hyperenhancing lesion in the right lobe of the liver, hepatic segment VI/VII with washout and capsular enhancement, measuring 2.4 x 1.8 cm, with a peripheral region of wedge shaped hyperemia. LI-RADS category 5, consistent with hepatocellular carcinoma. 2. Additional tiny arterially hyperenhancing lesion of hepatic segment III, measuring 0.4 cm, LI-RADS category 3, intermediate suspicion. Attention on follow-up. 3. Cirrhosis and mild splenomegaly. Right hepatic vein TIPS. 4. Cholelithiasis.   12/25/2023 Initial Diagnosis   Hepatocellular carcinoma (HCC)   12/25/2023 Cancer Staging   Staging form: Liver, AJCC 8th Edition - Clinical stage from 12/25/2023: Stage II (cT2, cN0, cM0) - Signed by Dry Mickiel, MD on 12/25/2023 Stage prefix: Initial diagnosis   01/01/2024 Imaging   CT chest with contrast:  IMPRESSION: Few mediastinal and left hilar prominent nodes. There are 2 nodes which are larger  than 1 cm in short axis, left paratracheal and towards the AP window. Please correlate with any prior chest CT.   01/01/2024 Imaging   CT chest with contrast:  IMPRESSION: Few mediastinal and left hilar prominent nodes. There are 2 nodes which are  larger than 1 cm in short axis, left paratracheal and towards the AP window. Please correlate with any prior chest CT.    Evidence of old granulomatous disease. Calcified left upper lobe nodule. Splenic granulomas. There are several tiny sub 5 mm nodules in the right lung. Again recommend comparison to prior or follow up in 12 months or sooner as per the patient's neoplasm surveillance.   02/27/2024 Imaging   CT abdomen:  Hepatic findings:   Cirrhotic liver morphology with sequela of portal hypertension including splenomegaly. Patent TIPS.  Observation 1: Redemonstrated and unchanged LI-RADS 5 lesion in segment 5/6.  Observation 2: Not well visualized by CT.  Extrahepatic findings:  None.    03/24/2024 Procedure   Y90 microwave ablation of liver lesion       Current Treatment: Ablation of the liver lesion  INTERVAL HISTORY:  [name] [name] is here today for follow up. Patient is accompanied by his sister today.  Patient is doing well at this time.  He has a brief episode of fatigue after Y90 procedure but has recovered from that.  He continues to smoke.  He denies hematochezia, melena, weight loss or loss of appetite.   I have reviewed the past medical history, past surgical history, social history and family history with the patient and they are unchanged from previous note.  ALLERGIES:  has no known allergies.  MEDICATIONS:  Current Outpatient Medications  Medication Sig Dispense Refill   lactulose  (CHRONULAC ) 10 GM/15ML solution Take 45 mLs (30 g total) by mouth daily. 3 tablespoons two times a day (Patient taking differently: Take 30 g by mouth daily. 2 tablespoons two times a day) 1350 mL 5   zolpidem (AMBIEN) 10 MG tablet Take 10 mg by mouth at bedtime as needed.     ACCU-CHEK AVIVA PLUS test strip 1 each by Other route as needed.     amLODipine  (NORVASC ) 10 MG tablet Take 1 tablet (10 mg total) by mouth at bedtime. 30 tablet 6   carvedilol  (COREG ) 6.25 MG tablet Take 6.25  mg by mouth 2 (two) times daily. One daily     cloNIDine (CATAPRES) 0.3 MG tablet Take 0.3 mg by mouth 3 (three) times daily.     furosemide  (LASIX ) 40 MG tablet Take 40 mg by mouth daily.     glimepiride  (AMARYL ) 4 MG tablet Take 4 mg by mouth daily before breakfast.     linaclotide  (LINZESS ) 145 MCG CAPS capsule Take 1 capsule (145 mcg total) by mouth daily. 90 capsule 1   losartan  (COZAAR ) 100 MG tablet Take 100 mg by mouth daily.     omeprazole (PRILOSEC) 20 MG capsule TAKE 1 CAPSULE BY MOUTH EVERY MORNING FOR ACID REFLUX. 30 capsule 5   OVER THE COUNTER MEDICATION Radiation by [facility].     PRESCRIPTION MEDICATION Eye injection every 4 months     SURE COMFORT PEN NEEDLES 31G X 8 MM MISC      TRESIBA FLEXTOUCH 100 UNIT/ML FlexTouch Pen Inject 32 Units into the skin daily. (Patient taking differently: Inject 25 Units into the skin daily.)     No current facility-administered medications for this visit.    REVIEW OF SYSTEMS:   Constitutional: Denies fevers, chills or abnormal  weight loss Eyes: Denies blurriness of vision Ears, nose, mouth, throat, and face: Denies mucositis or sore throat Respiratory: Denies cough, dyspnea or wheezes Cardiovascular: Denies palpitation, chest discomfort or lower extremity swelling Gastrointestinal:  Denies nausea, heartburn or change in bowel habits Skin: Denies abnormal skin rashes Lymphatics: Denies new lymphadenopathy or easy bruising Neurological:Denies numbness, tingling or new weaknesses Behavioral/Psych: Mood is stable, no new changes  All other systems were reviewed with the patient and are negative.   VITALS:  Blood pressure (!) 121/54, pulse (!) 52, temperature 97.6 F (36.4 C), temperature source Oral, resp. rate 19, weight 235 lb (106.6 kg), SpO2 100%.  Wt Readings from Last 3 Encounters:  05/26/24 235 lb (106.6 kg)  04/21/24 235 lb 4.8 oz (106.7 kg)  02/19/24 238 lb 11.2 oz (108.3 kg)    Body mass index is 33.24 kg/m.  Performance  status (ECOG): 0 - Asymptomatic  PHYSICAL EXAM:   GENERAL:alert, no distress and comfortable SKIN: skin color, texture, turgor are normal, no rashes or significant lesions LUNGS: clear to auscultation and percussion with normal breathing effort HEART: regular rate & rhythm and no murmurs and no lower extremity edema ABDOMEN:abdomen soft, non-tender and normal bowel sounds Musculoskeletal:no cyanosis of digits and no clubbing  NEURO: alert & oriented x 3 with fluent speech  LABORATORY DATA:  I have reviewed the data as listed and labs reviewed from Care Everywhere as above (under assessment and plan)    Component Value Date/Time   NA 139 12/25/2023 1047   NA 140 11/03/2018 0815   K 4.2 12/25/2023 1047   CL 107 12/25/2023 1047   CO2 25 12/25/2023 1047   GLUCOSE 84 12/25/2023 1047   BUN 22 12/25/2023 1047   BUN 23 11/03/2018 0815   CREATININE 1.50 (H) 12/25/2023 1047   CREATININE 1.20 11/26/2018 1056   CALCIUM 8.8 (L) 12/25/2023 1047   PROT 7.2 12/25/2023 1047   PROT 7.1 11/03/2018 0815   ALBUMIN 3.1 (L) 12/25/2023 1047   ALBUMIN 3.4 (L) 11/03/2018 0815   AST 43 (H) 12/25/2023 1047   ALT 33 12/25/2023 1047   ALKPHOS 139 (H) 12/25/2023 1047   BILITOT 1.6 (H) 12/25/2023 1047   BILITOT 1.8 (H) 11/03/2018 0815   GFRNONAA 51 (L) 12/25/2023 1047   GFRAA 40 (L) 11/03/2018 0815     Lab Results  Component Value Date   WBC 5.7 12/25/2023   NEUTROABS 2.5 12/25/2023   HGB 12.7 (L) 12/25/2023   HCT 38.0 (L) 12/25/2023   MCV 94.5 12/25/2023   PLT 107 (L) 12/25/2023      Chemistry      Component Value Date/Time   NA 139 12/25/2023 1047   NA 140 11/03/2018 0815   K 4.2 12/25/2023 1047   CL 107 12/25/2023 1047   CO2 25 12/25/2023 1047   BUN 22 12/25/2023 1047   BUN 23 11/03/2018 0815   CREATININE 1.50 (H) 12/25/2023 1047   CREATININE 1.20 11/26/2018 1056      Component Value Date/Time   CALCIUM 8.8 (L) 12/25/2023 1047   ALKPHOS 139 (H) 12/25/2023 1047   AST 43 (H)  12/25/2023 1047   ALT 33 12/25/2023 1047   BILITOT 1.6 (H) 12/25/2023 1047   BILITOT 1.8 (H) 11/03/2018 0815       RADIOGRAPHIC STUDIES: I have personally reviewed the radiological images as listed and agreed with the findings in the report.  No results found.

## 2024-06-17 ENCOUNTER — Encounter (INDEPENDENT_AMBULATORY_CARE_PROVIDER_SITE_OTHER): Payer: Self-pay | Admitting: Gastroenterology

## 2024-06-28 NOTE — Progress Notes (Signed)
 Primary Care Provider Davonna Siad, MD [phone] S. [address] [place] [address]    Patient Profile: [name] [name] is a 68 y.o. male who presents to the [facility] at the request of Dr. Cinderella for management of [facility].  History of Present Illness: [name] [name] is a 68 y.o. male with a history of HTN, DM, tobacco use. He has a family history of heart disease, with his father having died of a heart attack and other family members having strokes or heart attacks.  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 Approximately 20 years ago, he underwent a TIPS procedure due to significant ascites, requiring paracentesis of 30 liters every two weeks. Post-TIPS, his condition improved significantly.  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  He experienced a gallbladder rupture but did not undergo cholecystectomy. Instead, a tube was placed to manage gallstones.  Seen locally in ER for confusion. Ammonia elevated and started on lactulose . Referred to Dr Cinderella. An MRI on Dec 05, 2023, revealed liver cancer. He was referred to [facility] to discuss curative options including LT.  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  He is s/p aborted MWA due to multiple technical factors on 02/04/24. Given the time since his last imaging we obtained a CT on 02/27/24 that showed the tumor to be not significantly changed from prior exam measuring 2.5x1.7cm. We then transitioned to radioembolization and this was completed on 03/24/24.   Interval History:  History of Present Illness [name] [name] is a 68 year old male with hepatocellular carcinoma who presents for follow-up. He is accompanied by his sister.  He has experienced increased shakiness and balance issues over the past two to three  weeks, feeling unsteady on his feet with slow reaction times. He has not had any falls or injuries. No dizziness, syncope. He uses a stick for support when walking and relies on a shopping cart for balance at the grocery store.   Additionally, approximately two to three weeks ago, he had an episode of confusion associated with elevated ammonia levels, which required an ED visit and improved with increased lactulose . He continues to take lactulose , three tablespoons in the morning and evening, and sometimes increases to nine tablespoons if needed. He experiences three to four loose bowel movements per day. At times, the increased lactulose  dosing can cause abdominal bloating and discomfort.  He notes some fluid retention in his legs, which is about the same as before.   Denies bleeding such as blood in stool or vomiting blood. He mentions easy bruising and bleeding if he scratches himself.  His blood pressure is elevated in clinic today but reports that it has been well controlled at recent primary care visits. He has had an improvement in his Hgb A1c allowing a decrease in insulin  dosing. He maintains a good appetite. He is active throughout the day, often doing activities such as chopping wood.   General Review of Systems:  All other systems reviewed and negative except for as noted in the HPI  Past Medical History:  Diagnosis Date  . Cirrhosis of liver (CMS/HHS-HCC)   . PONV (postoperative nausea and vomiting)    previously w/ TIPS  . Systemic hypertension   . Tobacco abuse   .  Type 2 diabetes mellitus (CMS/HHS-HCC)     Past Surgical History:  Procedure Laterality Date  . TIPS PROCEDURE  2008  . PERCUTANEOUS PINNING EXTREMITY INCISION AND DRAINAGE OF WOUND  NERVE, TENDON AND ARTERY REPAIR  Right 12/28/2016  . COLONOSCOPY    . UPPER GASTROINTESTINAL ENDOSCOPY       Allergies: Patient has no known allergies.  Medications: Current Outpatient Medications  Medication Sig Dispense  Refill  . amLODIPine  (NORVASC ) 10 MG tablet Take 1 tablet by mouth at bedtime    . carvediloL  (COREG ) 6.25 MG tablet Take 6.25 mg by mouth 2 (two) times daily    . cloNIDine HCL (CATAPRES) 0.3 MG tablet Take 0.3 mg by mouth 3 (three) times a day    . FUROsemide  (LASIX ) 40 MG tablet Take 40 mg by mouth once daily    . glimepiride  (AMARYL ) 4 MG tablet Take 4 mg by mouth daily with breakfast    . lactulose  (ENULOSE ) 10 gram/15 mL oral solution Take 30 mLs by mouth 2 (two) times daily    . losartan  (COZAAR ) 100 MG tablet Take 100 mg by mouth once daily    . omeprazole (PRILOSEC) 20 MG DR capsule Take 20 mg by mouth once daily    . TRESIBA FLEXTOUCH U-100 pen injector (concentration 100 units/mL) Inject 32 Units subcutaneously once daily    . aspirin 81 MG EC tablet Take 81 mg by mouth once daily (Patient not taking: Reported on 06/29/2024)    . rifAXIMin (XIFAXAN) 550 mg tablet Take 1 tablet (550 mg total) by mouth 2 (two) times daily for 180 days 180 tablet 1   No current facility-administered medications for this visit.    Social History: He  reports that he has been smoking cigarettes. He started smoking about 50 years ago. He has a 70.9 pack-year smoking history. He has never used smokeless tobacco. He reports that he does not currently use alcohol. He reports that he does not use drugs.  Family History: His family history includes Ischemic heart disease in his father.  Physical Examination: Vitals:   06/29/24 1128  BP: (!) 143/72  Pulse: 53  Resp: 16  Temp: 36.6 C (97.9 F)    Body mass index is 33.15 kg/m. Wt Readings from Last 3 Encounters:  06/29/24 (!) 107.8 kg (237 lb 10.5 oz)  05/06/24 (!) 106.6 kg (235 lb)  02/04/24 (!) 111.6 kg (246 lb 0.5 oz)   General:  The patient is awake, alert, oriented & in no acute distress.   Psych: The mood & affect are noted to be appropriate. Head & Neck:  The neck is supple, there is no LAD or JVD.  The sclera are not injected nor  icteric.   Lungs:  Occasional wheeze BL CV:  Regular, rate, and rhythm. No murmurs, rubs or gallops appreciated.  Abdomen:  Soft, non tender, non distended, positive bowel sounds.  +splenomegaly There is no ascites.   Extremities:  There is minimal edema Skin:  There is no rash.  There were no spider angiomata. Neuro:  Grossly intact.  The patient is able to stand easily.  No assistance was needed on or off of the exam table.  Affect and speech normal.  No asterixis.    Labs and Studies: Office Visit on 06/29/2024  Component Date Value Ref Range Status  . Sodium 06/29/2024 142  135 - 145 mmol/L Final  . Potassium 06/29/2024 4.1  3.5 - 5.0 mmol/L Final  . Chloride 06/29/2024 115 (H)  98 - 108 mmol/L Final  . Carbon Dioxide (CO2) 06/29/2024 21  21 - 30 mmol/L Final  . Urea Nitrogen (BUN) 06/29/2024 19  7 - 20 mg/dL Final  . Creatinine 88/75/7974 1.6 (H)  0.6 - 1.3 mg/dL Final  . Glucose 88/75/7974 135  70 - 140 mg/dL Final   Interpretive Data: Above is the NONFASTING reference range.   Below are the FASTING reference ranges:  NORMAL:   70-99 mg/dL  PREDIABETES: 899-874 mg/dL  DIABETES:  > 874 mg/dL   . Calcium 06/29/2024 8.5 (L)  8.7 - 10.2 mg/dL Final  . AST (Aspartate Aminotransferase) 06/29/2024 44 (H)  15 - 41 U/L Final  . ALT (Alanine Aminotransferase) 06/29/2024 31  15 - 50 U/L Final  . Bilirubin, Total 06/29/2024 1.7 (H)  0.4 - 1.5 mg/dL Final  . Alk Phos (Alkaline Phosphatase) 06/29/2024 178 (H)  24 - 110 U/L Final  . Albumin 06/29/2024 2.5 (L)  3.5 - 4.8 g/dL Final  . Protein, Total 06/29/2024 6.9  6.2 - 8.1 g/dL Final  . Anion Gap 88/75/7974 6  3 - 12 mmol/L Final  . BUN/CREA Ratio 06/29/2024 12  6 - 27 Final  . Glomerular Filtration Rate (eGFR)  06/29/2024 47  mL/min/1.73sq m Final   CKD-EPI (2021) does not include patient's race in the calculation of eGFR. Monitoring changes of plasma creatinine and eGFR over time is useful for monitoring kidney function.  This  change was made on 10/04/2020.  Interpretive Ranges for eGFR(CKD-EPI 2021):   eGFR:       > 60 mL/min/1.73 sq m - Normal  eGFR:       30 - 59 mL/min/1.73 sq m - Moderately Decreased  eGFR:       15 - 29 mL/min/1.73 sq m - Severely Decreased  eGFR:       < 15 mL/min/1.73 sq m - Kidney Failure   Note: These eGFR calculations do not apply in acute situations  when eGFR is changing rapidly or in patients on dialysis.   . Prothrombin Time 06/29/2024 11.5  9.5 - 13.1 sec Final  . Prothrombin INR 06/29/2024 1.0  0.9 - 1.1 Final   Reference Ranges: DVT/PE/PVD = INR 2.0 - 3.0 Mechanical Heart Valve = INR 2.5 - 3.5  NOTE: The INR is not a PT Ratio and is valid only for Warfarin patients   . Alpha Fetoprotein (AFP) 06/29/2024 3.0  <9.0 ng/mL Final  . WBC (White Blood Cell Count) 06/29/2024 5.2  3.2 - 9.8 x10^9/L Final  . Hemoglobin 06/29/2024 11.7 (L)  13.7 - 17.3 g/dL Final  . Hematocrit 88/75/7974 34.3 (L)  39.0 - 49.0 % Final  . Platelets 06/29/2024 87 (L)  150 - 450 x10^9/L Final  . MCV (Mean Corpuscular Volume) 06/29/2024 95  80 - 98 fL Final  . MCH (Mean Corpuscular Hemoglobin) 06/29/2024 32.5  26.5 - 34.0 pg Final  . MCHC (Mean Corpuscular Hemoglobin * 06/29/2024 34.1  31.5 - 36.3 % Final  . RBC (Red Blood Cell Count) 06/29/2024 3.60 (L)  4.37 - 5.74 x10^12/L Final  . RDW-CV (Red Cell Distribution Widt* 06/29/2024 13.7  11.5 - 14.5 % Final  . NRBC (Nucleated Red Blood Cell Cou* 06/29/2024 0.00  0 x10^9/L Final  . NRBC % (Nucleated Red Blood Cell %) 06/29/2024 0.0  % Final  . MPV (Mean Platelet Volume) 06/29/2024 10.4  7.2 - 11.7 fL Final  . Immature Platelet Fraction 06/29/2024 2.5  1.6 - 8.5 % Final  .  Slide Review/Morphology 06/29/2024 Yes   Final   Blood film reviewed, instrument counts confirmed,   Results Pending   LABS Ammonia: 348  RADIOLOGY Abdominal MRI: Treated lesion stable, no new lesions, benign lesion stable (06/28/2024) Chest CT: No  metastatic lung disease, stable pulmonary nodules, no new lesions (06/29/2024)   Assessment & Plan: Mr. Schwertner is a 68 y.o. 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 Per prior discussions with patient, he prefers not to proceed w/LT. Notably, he does have potential LT barriers as he continue to smoke cigarettes and has limited mobility.   Assessment & Plan Hepatocellular carcinoma post-Y90 radioembolization Now s/p y90 radio embolization 03/24/24 after prior aborted ablation. Initial post-procedure period marked by significant fatigue and malaise, now improved. - Repeat MRI abdomen 11/23 with LR-TR nonprogressing of treated lesion and unchanged LR3 lesion, CT chest 11/24 with persistent lung nodules and LAD - Will t/b with IR regarding imaging results to ensure no concerning findings - Otherwise, will plan to repeat imaging in 3 months (MRI abd and CT chest) for close monitoring after treatment - AFP today, remains normal  Chronic liver disease Chronic liver disease managed by our team and local gastroenterology team. Hx TIPS. Carvedilol  used for blood pressure and liver-related indications.  - Updated MELD labs today - Continue carvedilol  6.25mg  BID - Continue to work with PCP to optimize metabolic risk factors   Hepatic encephalopathy, tremor Has ongoing, intermittent issues with confusion and elevated ammonia levels. On lactulose . Current bowel movements are 3-4 times daily, which is appropriate. - Continue lactulose  as currently prescribed - Prescribed rifaximin to manage hepatic encephalopathy symptoms, sent message to pharmacy team to assist with PA  Balance issues - Recommended local physical therapy to address balance issues  Peripheral edema Well-managed on lasix  40mg  daily.  Hypertension Blood pressure slightly elevated today, possibly due to missed clonidine dose.  Reports good control at home. - Continue current BP meds - Follow up with PCP for BP med adjustments   Abnormal findings on CT chest CT chest notable for pulmonary nodules and mediastinal and left axillary LAD. No current pulmonary symptoms. - CTM on repeat imaging in 3 months  Encounter for hepatitis B immunization Not immune to hepatitis B based on recent labs. Willing to receive vaccine today. - Administered first dose of hepatitis B vaccine today - Will schedule second dose of hepatitis B vaccine at next visit if not completed locally    FOLLOW UP - 3 months w/ imaging  Attestation Statement:   I personally performed the service, non-incident to. [facility])   ----- Billing for this encounter was based on medical decision making.  Directly related to the encounter today, I reviewed prior records including prior clinic notes and prior labs, imaging, and studies.   During the visit today, I had the privilege of meeting with the patient who independently provided additional pertinent information to assist in my comprehensive care of the patient.    SUZEN [name] SITTER, PA

## 2024-07-01 NOTE — Telephone Encounter (Addendum)
 Attempted to contact patient regarding MRI results. Unable to leave voicemail.

## 2024-07-06 NOTE — Telephone Encounter (Signed)
 Outgoing call placed to patient to relay providers message. Unable to contact or LVM for patient.   Labs are stable and that I reviewed his MRI with the radiology team. They agreed the MRI looks good and we can repeat in 3 months as planned.

## 2024-07-09 NOTE — Telephone Encounter (Signed)
 Contacted patient via phone. Upon asking patient to confirm name and birth date patient states y'all are supposed to have that. Informed patient that we need to verify we are speaking with the patient and nobody else. Phone call disconnected. Attempted a call back and was met with voicemail.

## 2024-07-27 ENCOUNTER — Ambulatory Visit (INDEPENDENT_AMBULATORY_CARE_PROVIDER_SITE_OTHER): Admitting: Gastroenterology

## 2024-07-27 ENCOUNTER — Encounter (INDEPENDENT_AMBULATORY_CARE_PROVIDER_SITE_OTHER): Payer: Self-pay | Admitting: Gastroenterology

## 2024-07-27 VITALS — BP 118/62 | HR 51 | Temp 97.2°F | Ht 70.5 in | Wt 249.9 lb

## 2024-07-27 DIAGNOSIS — K7460 Unspecified cirrhosis of liver: Secondary | ICD-10-CM | POA: Diagnosis not present

## 2024-07-27 DIAGNOSIS — C220 Liver cell carcinoma: Secondary | ICD-10-CM

## 2024-07-27 DIAGNOSIS — Z95828 Presence of other vascular implants and grafts: Secondary | ICD-10-CM

## 2024-07-27 DIAGNOSIS — Z1211 Encounter for screening for malignant neoplasm of colon: Secondary | ICD-10-CM

## 2024-07-27 NOTE — Progress Notes (Signed)
 "  [name] [name] , M.D. Gastroenterology & Hepatology [facility] Gastroenterology [address] [place], [place] [address] Primary Care Physician: Sheryle Carwin, MD [address] [place] [address]  Chief Complaint:  Cirrhosis , liver lesion   History of Present Illness: [name] [name] is a 68 y.o. 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  His main concern remains abdominal cramping and gas formation.  Patient was started on rifaximin at [facility] which she has been taking without any concerns  Previous history   May 2025 MRI was found to have [facility], was seen by Dr. Myrna transplant hepatologist ,He is status post aborted microwave ablation on 02/04/2024 due to technical limitations. Given the interval since prior imaging, a CT scan on 02/27/2024 demonstrated no significant change in tumor size, measuring 2.5  1.7 cm. The treatment approach was subsequently transitioned to radioembolization, which was successfully completed on 03/24/2024.  Patient was last seen by [facility] hepatology 06/2024 and had a surveillance MRI was seen with our oncologist on 05/2024  Patient has history of perforated acute cholecystitis and subhepatic abscess.  Treated with cholecystostomy and IV Abx . He underwent abdominal CT on 10/08/2018 which revealed 16mm enhancing lesion in segment 3 concerning for hepatocellular carcinoma. 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    He had EGD and colonoscopy in April 2009. EGD was normal and colonoscopy revealed external hemorrhoids.   Last labs from  03/16/2024  INR 1.0 Alk phos 178 ALT 31 AST 44 albumin 2.5 AFP 3.0 Hemoglobin 11.7 platelet 87  Past Medical History: Past Medical History:  Diagnosis Date   Anemia    Cryptogenic cirrhosis (HCC)    Diabetes mellitus    GERD (gastroesophageal reflux disease)    Hypertension    Liver disease     Past Surgical History: Past Surgical History:  Procedure Laterality Date   COLONOSCOPY     INCISION AND DRAINAGE OF WOUND Right 12/28/2016   Procedure: IRRIGATION AND DEBRIDEMENT WOUND;  Surgeon: Murrell Drivers, MD;  Location: [facility];  Service: Orthopedics;  Laterality: Right;   IR RADIOLOGIST EVAL & MGMT  10/08/2018   LIVER BIOPSY     NERVE, TENDON AND ARTERY REPAIR Right 12/28/2016   Procedure: NERVE, TENDON AND ARTERY REPAIR;  Surgeon: Murrell Drivers, MD;  Location: [facility];  Service: Orthopedics;  Laterality: Right;   PERCUTANEOUS PINNING Right 12/28/2016   Procedure: PERCUTANEOUS PINNING EXTREMITY;  Surgeon: Murrell Drivers, MD;  Location: [facility];  Service: Orthopedics;  Laterality: Right;   TIPS PROCEDURE  2008   UPPER GASTROINTESTINAL ENDOSCOPY      Family History: Family History  Problem Relation Age of Onset   Healthy Mother    Diabetes Father    Heart disease Father    Healthy Sister    Healthy Sister     Social History: Social History   Tobacco Use  Smoking Status Every Day   Current packs/day: 1.00   Average packs/day: 1 pack/day for 43.5 years (43.5 ttl pk-yrs)   Types: Cigarettes  Start date: 01/23/1981  Smokeless Tobacco Never  Tobacco Comments   patient states that in the 40 years he has stopped smoking then start again   Social History   Substance and Sexual Activity  Alcohol Use No   Alcohol/week: 0.0 standard drinks of alcohol   Social History   Substance and Sexual Activity  Drug Use No    Allergies: No Known Allergies  Medications: Current Outpatient Medications  Medication Sig Dispense  Refill   ACCU-CHEK AVIVA PLUS test strip 1 each by Other route as needed.     amLODipine  (NORVASC ) 10 MG tablet Take 1 tablet (10 mg total) by mouth at bedtime. 30 tablet 6   aspirin EC 81 MG tablet Take 81 mg by mouth daily. Swallow whole.     carvedilol  (COREG ) 6.25 MG tablet Take 6.25 mg by mouth 2 (two) times daily. One daily     cloNIDine (CATAPRES) 0.3 MG tablet Take 0.3 mg by mouth 3 (three) times daily.     furosemide  (LASIX ) 40 MG tablet Take 40 mg by mouth daily.     glimepiride  (AMARYL ) 4 MG tablet Take 4 mg by mouth daily before breakfast.     lactulose  (CHRONULAC ) 10 GM/15ML solution Take 45 mLs (30 g total) by mouth daily. 3 tablespoons two times a day (Patient taking differently: Take 30 g by mouth daily. 2 tablespoons two times a day) 1350 mL 5   losartan  (COZAAR ) 100 MG tablet Take 100 mg by mouth daily.     omeprazole (PRILOSEC) 20 MG capsule TAKE 1 CAPSULE BY MOUTH EVERY MORNING FOR ACID REFLUX. 30 capsule 5   OVER THE COUNTER MEDICATION Radiation by [facility].     PRESCRIPTION MEDICATION Eye injection every 4 months     rifaximin (XIFAXAN) 550 MG TABS tablet Take 550 mg by mouth.     SURE COMFORT PEN NEEDLES 31G X 8 MM MISC      TRESIBA FLEXTOUCH 100 UNIT/ML FlexTouch Pen Inject 32 Units into the skin daily. (Patient taking differently: Inject 25 Units into the skin daily.)     linaclotide  (LINZESS ) 145 MCG CAPS capsule Take 1 capsule (145 mcg total) by mouth daily. (Patient not taking: Reported on 07/27/2024) 90 capsule 1   zolpidem (AMBIEN) 10 MG tablet Take 10 mg by mouth at bedtime as needed. (Patient not taking: Reported on 07/27/2024)     No current facility-administered medications for this visit.    Review of Systems: GENERAL: negative for malaise, night sweats HEENT: No changes in hearing or vision, no nose bleeds or other nasal problems. NECK: Negative for lumps, goiter, pain and significant neck swelling RESPIRATORY: Negative for cough, wheezing CARDIOVASCULAR:  Negative for chest pain, leg swelling, palpitations, orthopnea GI: SEE HPI MUSCULOSKELETAL: Negative for joint pain or swelling, back pain, and muscle pain. SKIN: Negative for lesions, rash HEMATOLOGY Negative for prolonged bleeding, bruising easily, and swollen nodes. ENDOCRINE: Negative for cold or heat intolerance, polyuria, polydipsia and goiter. NEURO: negative for tremor, gait imbalance, syncope and seizures. The remainder of the review of systems is noncontributory.   Physical Exam: BP 118/62   Pulse (!) 51   Temp (!) 97.2 F (36.2 C)   Ht 5' 10.5 (1.791 m)   Wt 249 lb 14.4 oz (113.4 kg)   BMI 35.35 kg/m  GENERAL: The patient is AO x3, in no acute distress. HEENT: Head is normocephalic and atraumatic. EOMI are intact. Mouth is well hydrated and without lesions. NECK: Supple. No masses LUNGS: Clear to auscultation.  No presence of rhonchi/wheezing/rales. Adequate chest expansion HEART: RRR, normal s1 and s2. ABDOMEN: Soft, nontender, no guarding, no peritoneal signs, and nondistended. BS +. No masses.   06/2024- s/p locoregional treatement   Impression:  1. Observation 1: Status post radioembolization of hepatic segment 5/6  lesion with small internal areas of arterial enhancement without washout.  LR-TR nonprogressing.  2.  Observation 2: Unchanged subcentimeter arterial enhancing lesion in  hepatic segment 3. LR-3.   CT done at [facility]   1. Large calcified left apical nodule likely representing sequela prior  granulomatous infection.  2. Scattered noncalcified pulmonary nodules up to 3 mm in size are  indeterminant but could be postinfectious in nature. Follow-up per clinical  protocol.  3. Mediastinal and left axillary lymphadenopathy is nonspecific and could  be reactive.  4. Cirrhotic liver morphology, best characterized on recent abdominal MRI.   MRI 2025  MRI   IMPRESSION: 1. Diffusion restricting, arterially hyperenhancing lesion in the right lobe of  the liver, hepatic segment VI/VII with washout and capsular enhancement, measuring 2.4 x 1.8 cm, with a peripheral region of wedge shaped hyperemia. LI-RADS category 5, consistent with hepatocellular carcinoma. 2. Additional tiny arterially hyperenhancing lesion of hepatic segment III, measuring 0.4 cm, LI-RADS category 3, intermediate suspicion. Attention on follow-up. 3. Cirrhosis and mild splenomegaly. Right hepatic vein TIPS. 4. Cholelithiasis.  Imaging/Labs: MRI 2020 1. There are changes of cirrhosis with areas of hepatic fibrosis, as discussed above. The suspicious lesion in segment 2 of the liver has imaging characteristics most compatible with a flash fill cavernous hemangioma. Given the background of cirrhosis, follow-up MRI of the abdomen with and without IV gadolinium is recommended in 3-6 months to ensure the stability of this finding. 2. Moderate right pleural effusion lying dependently. 3. Small volume of ascites. 4. Splenomegaly.  Liver biopsy 2006  1) (Liver, biopsy):     CHRONIC ACTIVE INFLAMMATION.  INCREASED FIBROSIS.  SPECIAL     STAINS, IRON, RETICULUM, AND TRICHROME PENDING.      Latest Ref Rng & Units 12/25/2023   10:47 AM 10/13/2023   12:13 AM 11/26/2018   10:56 AM  CBC  WBC 4.0 - 10.5 K/uL 5.7  6.3  5.0   Hemoglobin 13.0 - 17.0 g/dL 87.2  86.9  87.3   Hematocrit 39.0 - 52.0 % 38.0  37.7  36.5   Platelets 150 - 400 K/uL 107  119  118    Lab Results  Component Value Date   IRON 281 (H) 10/15/2014   TIBC NOT CALC 10/15/2014   FERRITIN 545 (H) 10/15/2014    I personally reviewed and interpreted the available labs, imaging and endoscopic files.  Hepatitis A immune Hepatitis B not exposed nonimmune Hep C negative INR 1.0 ANA negative  Impression and Plan:  [name] [name] is a 68 y.o. 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 Had liver biopsy in 2006 Hep A immune, Hep B non immune, Hep C negative ( Hep b vaccination protocol started to recently)  # Hepatic encephalopathy - Titrate lactulose  to achieve 2-3 Bms per day  - Avoid opiates or benzodiazepines - Continue rifaximin  # Ascites; none on recent imaging , has history of TIPS  - Low  sodium diet - Diuretics continue  # Esophageal varices - Last EGD 2009, patient is on carvedilol  6.125 twice daily which acts as primary prophylaxis.   Recommendation  -hep B vaccination; continue vaccination at [facility]  - Check CBC, MELD labs - Reduce salt intake to <2 g per day - Can take Tylenol  max of 2 g per day (650 mg q8h) for pain - Avoid NSAIDs for pain - Avoid eating raw oysters/shellfish - Continue furosemide  40 mg qday - Continue lactulose  30 g TID, goal 2-3 Bms per day/rifaximin -Given abdominal cramps and gas formation I gave option to add Linzess  and titrate down lactulose  but he would like to continue current management - Patient is due for colonoscopy we will schedule - upper endoscopy for Barrett's screening given risk factors - Continue carvedilol   [facility]  Liver cancer confirmed on MRI with a 2.4 cm lesion isolated to the liver. LIRAD 5 s/p radioembolization  Patient is following with oncology at [facility]  and transplant hepatology at [facility]   I will let [facility] hepatology team follow-up patient with continued care of [facility], is plan for surveillance MRI in 3 months  Plan for MRI 3 months after treatment (for first 2 years )and possibly PET scan.  Then every 6 to 12 months thereafter  All questions were answered.      Cullin Dishman Faizan Amritha Yorke, MD Gastroenterology and Hepatology [facility] Gastroenterology   This chart has been completed using [facility] Dictation software, and while attempts  have been made to ensure accuracy , certain words and phrases may not be transcribed as intended   "

## 2024-07-27 NOTE — Patient Instructions (Signed)
 It was very nice to meet you today, as dicussed with will plan for the following :  1) upper endoscopy and colonoscopy

## 2024-07-28 ENCOUNTER — Telehealth: Payer: Self-pay | Admitting: *Deleted

## 2024-07-28 ENCOUNTER — Ambulatory Visit (INDEPENDENT_AMBULATORY_CARE_PROVIDER_SITE_OTHER): Admitting: Gastroenterology

## 2024-07-28 MED ORDER — PEG 3350-KCL-NA BICARB-NACL 420 G PO SOLR: 4000.0000 mL | Freq: Once | ORAL | 0 refills | Status: AC

## 2024-07-28 NOTE — Telephone Encounter (Signed)
 Spoke with pt. He has been scheduled for TCS/EGD, any room with Dr. Cinderella on 09/01/24. Aware will mail instructions and send rx for prep to pharmacy.

## 2024-08-26 ENCOUNTER — Inpatient Hospital Stay: Admitting: Oncology

## 2024-08-27 ENCOUNTER — Telehealth: Payer: Self-pay | Admitting: *Deleted

## 2024-08-27 NOTE — Telephone Encounter (Signed)
 LMOVM providing # to the hospital in case they can't make it into procedure for Tuesday

## 2024-09-01 ENCOUNTER — Ambulatory Visit (HOSPITAL_COMMUNITY): Admission: RE | Admit: 2024-09-01 | Source: Home / Self Care | Admitting: Gastroenterology

## 2024-09-01 ENCOUNTER — Other Ambulatory Visit: Payer: Self-pay

## 2024-09-01 ENCOUNTER — Ambulatory Visit (HOSPITAL_COMMUNITY): Admitting: Anesthesiology

## 2024-09-01 ENCOUNTER — Encounter (HOSPITAL_COMMUNITY): Payer: Self-pay | Admitting: Gastroenterology

## 2024-09-01 ENCOUNTER — Encounter (HOSPITAL_COMMUNITY)
Admission: RE | Disposition: A | Discharge status: Home / Self Care | Payer: Self-pay | Source: Home / Self Care | Attending: Gastroenterology

## 2024-09-01 DIAGNOSIS — I10 Essential (primary) hypertension: Secondary | ICD-10-CM | POA: Diagnosis not present

## 2024-09-01 DIAGNOSIS — Z7984 Long term (current) use of oral hypoglycemic drugs: Secondary | ICD-10-CM | POA: Diagnosis not present

## 2024-09-01 DIAGNOSIS — E119 Type 2 diabetes mellitus without complications: Secondary | ICD-10-CM | POA: Insufficient documentation

## 2024-09-01 DIAGNOSIS — Z833 Family history of diabetes mellitus: Secondary | ICD-10-CM | POA: Insufficient documentation

## 2024-09-01 DIAGNOSIS — K648 Other hemorrhoids: Secondary | ICD-10-CM | POA: Diagnosis not present

## 2024-09-01 DIAGNOSIS — F17210 Nicotine dependence, cigarettes, uncomplicated: Secondary | ICD-10-CM | POA: Diagnosis not present

## 2024-09-01 DIAGNOSIS — Z8249 Family history of ischemic heart disease and other diseases of the circulatory system: Secondary | ICD-10-CM | POA: Insufficient documentation

## 2024-09-01 DIAGNOSIS — Z13810 Encounter for screening for upper gastrointestinal disorder: Secondary | ICD-10-CM | POA: Diagnosis present

## 2024-09-01 DIAGNOSIS — N289 Disorder of kidney and ureter, unspecified: Secondary | ICD-10-CM | POA: Diagnosis not present

## 2024-09-01 DIAGNOSIS — K635 Polyp of colon: Secondary | ICD-10-CM

## 2024-09-01 DIAGNOSIS — Z794 Long term (current) use of insulin: Secondary | ICD-10-CM | POA: Diagnosis not present

## 2024-09-01 DIAGNOSIS — K2950 Unspecified chronic gastritis without bleeding: Secondary | ICD-10-CM | POA: Diagnosis not present

## 2024-09-01 DIAGNOSIS — D123 Benign neoplasm of transverse colon: Secondary | ICD-10-CM | POA: Diagnosis not present

## 2024-09-01 DIAGNOSIS — Z1211 Encounter for screening for malignant neoplasm of colon: Secondary | ICD-10-CM

## 2024-09-01 DIAGNOSIS — D125 Benign neoplasm of sigmoid colon: Secondary | ICD-10-CM

## 2024-09-01 DIAGNOSIS — K219 Gastro-esophageal reflux disease without esophagitis: Secondary | ICD-10-CM | POA: Diagnosis not present

## 2024-09-01 DIAGNOSIS — K2970 Gastritis, unspecified, without bleeding: Secondary | ICD-10-CM

## 2024-09-01 LAB — GLUCOSE, CAPILLARY: Glucose-Capillary: 80 mg/dL (ref 70–99)

## 2024-09-01 MED ORDER — PHENYLEPHRINE 80 MCG/ML (10ML) SYRINGE FOR IV PUSH (FOR BLOOD PRESSURE SUPPORT)
PREFILLED_SYRINGE | INTRAVENOUS | Status: DC | PRN
  Administered 2024-09-01: 80 ug via INTRAVENOUS

## 2024-09-01 MED ORDER — LIDOCAINE 2% (20 MG/ML) 5 ML SYRINGE
INTRAMUSCULAR | Status: DC | PRN
  Administered 2024-09-01: 40 mg via INTRAVENOUS
  Administered 2024-09-01: 60 mg via INTRAVENOUS

## 2024-09-01 MED ORDER — LACTATED RINGERS IV SOLN: INTRAVENOUS | Status: DC

## 2024-09-01 MED ORDER — PROPOFOL 500 MG/50ML IV EMUL
INTRAVENOUS | Status: DC | PRN
  Administered 2024-09-01: 80 mg via INTRAVENOUS
  Administered 2024-09-01: 125 ug/kg/min via INTRAVENOUS

## 2024-09-01 MED ORDER — EPHEDRINE SULFATE (PRESSORS) 25 MG/5ML IV SOSY
PREFILLED_SYRINGE | INTRAVENOUS | Status: DC | PRN
  Administered 2024-09-01 (×2): 10 mg via INTRAVENOUS

## 2024-09-01 NOTE — Op Note (Signed)
 [facility] Patient Name: [name] [name] Procedure Date: 09/01/2024 10:33 AM MRN: [id number] Date of Birth: 1955-09-29 Attending MD: Deatrice Dine , MD, [id number] CSN: [id number] Age: 69 Admit Type: Outpatient Procedure:                Colonoscopy Indications:              Screening for colorectal malignant neoplasm Providers:                Deatrice Dine, MD, Jon LABOR. Gerome RN, RN, Bascom Blush Referring MD:              Medicines:                Monitored Anesthesia Care Complications:            No immediate complications. Estimated Blood Loss:     Estimated blood loss was minimal. Procedure:                Pre-Anesthesia Assessment:                           - Prior to the procedure, a History and Physical                            was performed, and patient medications and                            allergies were reviewed. The patient's tolerance of                            previous anesthesia was also reviewed. The risks                            and benefits of the procedure and the sedation                            options and risks were discussed with the patient.                            All questions were answered, and informed consent                            was obtained. Prior Anticoagulants: The patient has                            taken no anticoagulant or antiplatelet agents. ASA                            Grade Assessment: III - A patient with severe                            systemic disease. After reviewing the risks and  benefits, the patient was deemed in satisfactory                            condition to undergo the procedure.                           After obtaining informed consent, the colonoscope                            was passed under direct vision. Throughout the                            procedure, the patient's blood pressure, pulse, and                             oxygen saturations were monitored continuously. The                            [id number]-[id number] ([id number]) Colon was introduced through                            the anus and advanced to the the cecum, identified                            by appendiceal orifice and ileocecal valve. The                            colonoscopy was performed without difficulty. The                            patient tolerated the procedure well. The quality                            of the bowel preparation was evaluated using the                            BBPS [facility] Bowel Preparation Scale) with scores                            of: Right Colon = 2 (minor amount of residual                            staining, small fragments of stool and/or opaque                            liquid, but mucosa seen well), Transverse Colon = 2                            (minor amount of residual staining, small fragments                            of stool and/or opaque liquid, but mucosa seen  well) and Left Colon = 2 (minor amount of residual                            staining, small fragments of stool and/or opaque                            liquid, but mucosa seen well). The total BBPS score                            equals 6. The ileocecal valve, appendiceal orifice,                            and rectum were photographed. Scope In: 11:23:12 AM Scope Out: 11:53:02 AM Scope Withdrawal Time: 0 hours 25 minutes 33 seconds  Total Procedure Duration: 0 hours 29 minutes 50 seconds  Findings:      The perianal and digital rectal examinations were normal.      A 5 mm polyp was found in the transverse colon. The polyp was sessile.       The polyp was removed with a cold snare. Resection and retrieval were       complete.      A 6 mm polyp was found in the sigmoid colon. The polyp was sessile. The       polyp was removed with a cold snare. Resection and retrieval were       complete.      Non-bleeding  internal hemorrhoids were found during endoscopy. The       hemorrhoids were small. Retroflexion not performed due to narrow rectal       vault      A moderate amount of stool was found in the entire colon, precluding       visualization. Lavage of the area was performed using a large amount of       sterile water, resulting in clearance with good visualization. Impression:               - One 5 mm polyp in the transverse colon, removed                            with a cold snare. Resected and retrieved.                           - One 6 mm polyp in the sigmoid colon, removed with                            a cold snare. Resected and retrieved.                           - Non-bleeding internal hemorrhoids.                           - Stool in the entire examined colon. Moderate Sedation:      Per Anesthesia Care Recommendation:           - Patient has a contact number available for  emergencies. The signs and symptoms of potential                            delayed complications were discussed with the                            patient. Return to normal activities tomorrow.                            Written discharge instructions were provided to the                            patient.                           - Resume previous diet.                           - Continue present medications.                           - Await pathology results.                           - Repeat colonoscopy in 5 years for surveillance                            based on pathology results. Note : 5 year recall                            suggested due to marginal bowel prep .                           - Return to GI office as previously scheduled. Procedure Code(s):        --- Professional ---                           [phone], Colonoscopy, flexible; with removal of                            tumor(s), polyp(s), or other lesion(s) by snare                             technique Diagnosis Code(s):        --- Professional ---                           Z12.11, Encounter for screening for malignant                            neoplasm of colon                           D12.3, Benign neoplasm of transverse colon (hepatic  flexure or splenic flexure)                           D12.5, Benign neoplasm of sigmoid colon                           K64.8, Other hemorrhoids CPT copyright 2022 American Medical Association. All rights reserved. The codes documented in this report are preliminary and upon coder review may  be revised to meet current compliance requirements. Deatrice Dine, MD Deatrice Dine, MD 09/01/2024 12:08:16 PM This report has been signed electronically. Number of Addenda: 0

## 2024-09-01 NOTE — Discharge Instructions (Signed)

## 2024-09-01 NOTE — Anesthesia Procedure Notes (Signed)
 Date/Time: 09/01/2024 11:09 AM  Performed by: Para Barrows L, CRNAComments: POM Face Mask.

## 2024-09-01 NOTE — Transfer of Care (Signed)
 Immediate Anesthesia Transfer of Care Note  Patient: [name] [name]  Procedure(s) Performed: COLONOSCOPY EGD (ESOPHAGOGASTRODUODENOSCOPY) POLYPECTOMY, INTESTINE  Patient Location: Short Stay  Anesthesia Type:MAC  Level of Consciousness: awake and patient cooperative  Airway & Oxygen Therapy: Patient Spontanous Breathing  Post-op Assessment: Report given to RN and Post -op Vital signs reviewed and stable  Post vital signs: Reviewed and stable  Last Vitals:  Vitals Value Taken Time  BP 133/62 09/01/24 11:59  Temp 36.4 C 09/01/24 11:59  Pulse 69 09/01/24 11:59  Resp 16 09/01/24 11:59  SpO2 100 % 09/01/24 11:59    Last Pain:  Vitals:   09/01/24 1159  TempSrc: Axillary  PainSc: 0-No pain      Patients Stated Pain Goal: 10 (09/01/24 1057)  Complications: No notable events documented.

## 2024-09-01 NOTE — Anesthesia Preprocedure Evaluation (Signed)
"                                    Anesthesia Evaluation  Patient identified by MRN, date of birth, ID band Patient awake    Reviewed: Allergy & Precautions, H&P , NPO status , Patient's Chart, lab work & pertinent test results, reviewed documented beta blocker date and time   Airway Mallampati: II  TM Distance: >3 FB Neck ROM: full    Dental no notable dental hx.    Pulmonary neg pulmonary ROS, Current Smoker and Patient abstained from smoking.   Pulmonary exam normal breath sounds clear to auscultation       Cardiovascular Exercise Tolerance: Good hypertension,  Rhythm:regular Rate:Normal     Neuro/Psych  PSYCHIATRIC DISORDERS      negative neurological ROS     GI/Hepatic Neg liver ROS,GERD  ,,  Endo/Other  diabetes    Renal/GU Renal disease  negative genitourinary   Musculoskeletal   Abdominal   Peds  Hematology  (+) Blood dyscrasia, anemia   Anesthesia Other Findings   Reproductive/Obstetrics negative OB ROS                              Anesthesia Physical Anesthesia Plan  ASA: 3  Anesthesia Plan: MAC   Post-op Pain Management:    Induction:   PONV Risk Score and Plan: Propofol  infusion  Airway Management Planned:   Additional Equipment:   Intra-op Plan:   Post-operative Plan:   Informed Consent: I have reviewed the patients History and Physical, chart, labs and discussed the procedure including the risks, benefits and alternatives for the proposed anesthesia with the patient or authorized representative who has indicated his/her understanding and acceptance.     Dental Advisory Given  Plan Discussed with: CRNA  Anesthesia Plan Comments:         Anesthesia Quick Evaluation  "

## 2024-09-01 NOTE — H&P (Signed)
 Primary Care Physician:  Sheryle Carwin, MD Primary Gastroenterologist:  Dr. Cinderella  Pre-Procedure History & Physical: HPI:  [name] [name] is a 69 y.o. male is here for a colonoscopy for colon cancer screening purposes and Barretts screening .  Patient denies any family history of colorectal cancer.  No melena or hematochezia.  No abdominal pain or unintentional weight loss.  No change in bowel habits.    He had EGD and colonoscopy in April 2009. EGD was normal and colonoscopy revealed external hemorrhoids.   Past Medical History:  Diagnosis Date   Anemia    Cryptogenic cirrhosis (HCC)    Diabetes mellitus    GERD (gastroesophageal reflux disease)    Hypertension    Liver disease     Past Surgical History:  Procedure Laterality Date   COLONOSCOPY     INCISION AND DRAINAGE OF WOUND Right 12/28/2016   Procedure: IRRIGATION AND DEBRIDEMENT WOUND;  Surgeon: Murrell Drivers, MD;  Location: [facility];  Service: Orthopedics;  Laterality: Right;   IR RADIOLOGIST EVAL & MGMT  10/08/2018   LIVER BIOPSY     NERVE, TENDON AND ARTERY REPAIR Right 12/28/2016   Procedure: NERVE, TENDON AND ARTERY REPAIR;  Surgeon: Murrell Drivers, MD;  Location: [facility];  Service: Orthopedics;  Laterality: Right;   PERCUTANEOUS PINNING Right 12/28/2016   Procedure: PERCUTANEOUS PINNING EXTREMITY;  Surgeon: Murrell Drivers, MD;  Location:  SURGERY CENTER;  Service: Orthopedics;  Laterality: Right;   TIPS PROCEDURE  2008   UPPER GASTROINTESTINAL ENDOSCOPY      Prior to Admission medications  Medication Sig Start Date End Date Taking? Authorizing Provider  amLODipine  (NORVASC ) 10 MG tablet Take 1 tablet (10 mg total) by mouth at bedtime. 11/30/19  Yes Richarda Prentice LITTIE Mickey., NP  carvedilol  (COREG ) 6.25 MG tablet Take 6.25 mg by mouth 2 (two) times daily. One daily   Yes [provider]  cloNIDine (CATAPRES) 0.3 MG tablet Take 0.3 mg by mouth 3 (three) times daily. 08/14/19  Yes  [provider]  furosemide  (LASIX ) 40 MG tablet Take 40 mg by mouth daily. 12/28/16  Yes [provider]  glimepiride  (AMARYL ) 4 MG tablet Take 4 mg by mouth daily before breakfast.   Yes [provider]  lactulose  (CHRONULAC ) 10 GM/15ML solution Take 45 mLs (30 g total) by mouth daily. 3 tablespoons two times a day Patient taking differently: Take 30 g by mouth daily. 2 tablespoons two times a day 05/11/24 11/07/24 Yes Carlan, Chelsea L, NP  losartan  (COZAAR ) 100 MG tablet Take 100 mg by mouth daily. 08/14/19  Yes [provider]  omeprazole (PRILOSEC) 20 MG capsule TAKE 1 CAPSULE BY MOUTH EVERY MORNING FOR ACID REFLUX. 07/05/14  Yes Rehman, Claudis PENNER, MD  rifaximin (XIFAXAN) 550 MG TABS tablet Take 550 mg by mouth. 06/29/24 12/26/24 Yes [provider]  ACCU-CHEK AVIVA PLUS test strip 1 each by Other route as needed. 08/07/22   [provider]  aspirin EC 81 MG tablet Take 81 mg by mouth daily. Swallow whole.    [provider]  linaclotide  (LINZESS ) 145 MCG CAPS capsule Take 1 capsule (145 mcg total) by mouth daily. Patient not taking: Reported on 07/27/2024 02/19/24 08/17/24  Cinderella Deatrice FALCON, MD  OVER THE COUNTER MEDICATION Radiation by [facility].    [provider]  PRESCRIPTION MEDICATION Eye injection every 4 months    [provider]  SURE COMFORT PEN NEEDLES 31G X 8 MM MISC  08/21/22  [provider]  TRESIBA FLEXTOUCH 100 UNIT/ML FlexTouch Pen Inject 32 Units into the skin daily. Patient taking differently: Inject 25 Units into the skin daily. 08/21/22   [provider]  zolpidem (AMBIEN) 10 MG tablet Take 10 mg by mouth at bedtime as needed. Patient not taking: No sig reported 04/29/24   [provider]    Allergies as of 07/28/2024   (No Known Allergies)    Family History  Problem Relation Age of Onset   Healthy Mother    Diabetes Father    Heart disease Father    Healthy Sister     Healthy Sister     Social History   Socioeconomic History   Marital status: Single    Spouse name: Not on file   Number of children: Not on file   Years of education: Not on file   Highest education level: Not on file  Occupational History   Not on file  Tobacco Use   Smoking status: Every Day    Current packs/day: 1.00    Average packs/day: 1 pack/day for 43.6 years (43.6 ttl pk-yrs)    Types: Cigarettes    Start date: 01/23/1981   Smokeless tobacco: Never   Tobacco comments:    patient states that in the 40 years he has stopped smoking then start again  Vaping Use   Vaping status: Never Used  Substance and Sexual Activity   Alcohol use: No    Alcohol/week: 0.0 standard drinks of alcohol   Drug use: No   Sexual activity: Not on file  Other Topics Concern   Not on file  Social History Narrative   Not on file   Social Drivers of Health   Tobacco Use: High Risk (09/01/2024)   Patient History    Smoking Tobacco Use: Every Day    Smokeless Tobacco Use: Never    Passive Exposure: Not on file  Financial Resource Strain: Not on file  Food Insecurity: No Food Insecurity (12/25/2023)   Hunger Vital Sign    Worried About Running Out of Food in the Last Year: Never true    Ran Out of Food in the Last Year: Never true  Transportation Needs: No Transportation Needs (12/25/2023)   PRAPARE - Administrator, Civil Service (Medical): No    Lack of Transportation (Non-Medical): No  Physical Activity: Not on file  Stress: Not on file  Social Connections: Not on file  Intimate Partner Violence: Not At Risk (12/25/2023)   Humiliation, Afraid, Rape, and Kick questionnaire    Fear of Current or Ex-Partner: No    Emotionally Abused: No    Physically Abused: No    Sexually Abused: No  Depression (PHQ2-9): Low Risk (05/26/2024)   Depression (PHQ2-9)    PHQ-2 Score: 0  Alcohol Screen: Not on file  Housing: Unknown (02/27/2024)   Received from [facility]    Epic    Unable to Pay for Housing in the Last Year: Not on file    Number of Times Moved in the Last Year: Not on file    At any time in the past 12 months, were you homeless or living in a shelter (including now)?: No  Utilities: Not At Risk (12/25/2023)   [facility] Utilities    Threatened with loss of utilities: No  Health Literacy: Not on file    Review of Systems: See HPI, otherwise negative ROS  Physical Exam: Vital signs in last 24 hours: Temp:  [98.1 F (  36.7 C)] 98.1 F (36.7 C) (01/27 1057) Pulse Rate:  [59] 59 (01/27 1057) Resp:  [17] 17 (01/27 1057) BP: (181)/(63) 181/63 (01/27 1057) SpO2:  [100 %] 100 % (01/27 1057) Weight:  [111.1 kg] 111.1 kg (01/27 1057)   General:   Alert,  Well-developed, well-nourished, pleasant and cooperative in NAD Head:  Normocephalic and atraumatic. Eyes:  Sclera clear, no icterus.   Conjunctiva pink. Ears:  Normal auditory acuity. Nose:  No deformity, discharge,  or lesions. Msk:  Symmetrical without gross deformities. Normal posture. Extremities:  Without clubbing or edema. Neurologic:  Alert and  oriented x4;  grossly normal neurologically. Skin:  Intact without significant lesions or rashes. Psych:  Alert and cooperative. Normal mood and affect.  Impression/Plan: [name] [name] is a 69 y.o. male is here for a colonoscopy for colon cancer screening purposes and Barretts screening   Proceed with Egd and colonoscopy   The risks of the procedure including infection, bleed, or perforation as well as benefits, limitations, alternatives and imponderables have been reviewed with the patient. Questions have been answered. All parties agreeable.

## 2024-09-01 NOTE — Op Note (Signed)
 [facility] Patient Name: [name] [name] Procedure Date: 09/01/2024 10:34 AM MRN: [id number] Date of Birth: [birth year] Attending MD: Deatrice Dine , MD, [id number] CSN: [id number] Age: 69 Admit Type: Outpatient Procedure:                Upper GI endoscopy Indications:              Screening procedure, Screening for Barrett's                            esophagus, Screening for Barrett's esophagus in                            patient at risk for this condition Providers:                Deatrice Dine, MD, Jon LABOR. Gerome RN, RN, Bascom Blush Referring MD:              Medicines:                Monitored Anesthesia Care Complications:            No immediate complications. Estimated Blood Loss:     Estimated blood loss was minimal. Procedure:                Pre-Anesthesia Assessment:                           - Prior to the procedure, a History and Physical                            was performed, and patient medications and                            allergies were reviewed. The patient's tolerance of                            previous anesthesia was also reviewed. The risks                            and benefits of the procedure and the sedation                            options and risks were discussed with the patient.                            All questions were answered, and informed consent                            was obtained. Prior Anticoagulants: The patient has                            taken no anticoagulant or antiplatelet agents  except for aspirin. ASA Grade Assessment: III - A                            patient with severe systemic disease. After                            reviewing the risks and benefits, the patient was                            deemed in satisfactory condition to undergo the                            procedure.                           After obtaining informed consent, the  endoscope was                            passed under direct vision. Throughout the                            procedure, the patient's blood pressure, pulse, and                            oxygen saturations were monitored continuously. The                            [id number]-YV809 ([id number]) Upper was introduced through                            the mouth, and advanced to the second part of                            duodenum. The upper GI endoscopy was accomplished                            without difficulty. The patient tolerated the                            procedure well. Scope In: 11:14:47 AM Scope Out: 11:18:23 AM Total Procedure Duration: 0 hours 3 minutes 36 seconds  Findings:      The Z-line was regular and was found at the gastroesophageal junction.      There is no endoscopic evidence of varices in the entire esophagus.      Mild inflammation characterized by erythema was found in the gastric       antrum. Biopsies were taken with a cold forceps for histology.      The duodenal bulb and second portion of the duodenum were normal. Impression:               - Z-line regular, at the gastroesophageal junction.                           - Gastritis. Biopsied.                           -  Normal duodenal bulb and second portion of the                            duodenum. Moderate Sedation:      Per Anesthesia Care Recommendation:           - Patient has a contact number available for                            emergencies. The signs and symptoms of potential                            delayed complications were discussed with the                            patient. Return to normal activities tomorrow.                            Written discharge instructions were provided to the                            patient.                           - Resume previous diet.                           - Continue present medications.                           - Await pathology  results. Procedure Code(s):        --- Professional ---                           [phone], Esophagogastroduodenoscopy, flexible,                            transoral; with biopsy, single or multiple Diagnosis Code(s):        --- Professional ---                           K29.70, Gastritis, unspecified, without bleeding                           Z13.810, Encounter for screening for upper                            gastrointestinal disorder CPT copyright 2022 American Medical Association. All rights reserved. The codes documented in this report are preliminary and upon coder review may  be revised to meet current compliance requirements. Deatrice Dine, MD Deatrice Dine, MD 09/01/2024 11:21:52 AM This report has been signed electronically. Number of Addenda: 0

## 2024-09-02 LAB — SURGICAL PATHOLOGY

## 2024-09-02 NOTE — Anesthesia Postprocedure Evaluation (Signed)
"   Anesthesia Post Note  Patient: [name] [name]  Procedure(s) Performed: COLONOSCOPY EGD (ESOPHAGOGASTRODUODENOSCOPY) POLYPECTOMY, INTESTINE  Patient location during evaluation: Phase II Anesthesia Type: MAC Level of consciousness: awake Pain management: pain level controlled Vital Signs Assessment: post-procedure vital signs reviewed and stable Respiratory status: spontaneous breathing and respiratory function stable Cardiovascular status: blood pressure returned to baseline and stable Postop Assessment: no headache and no apparent nausea or vomiting Anesthetic complications: no Comments: Late entry   No notable events documented.   Last Vitals:  Vitals:   09/01/24 1057 09/01/24 1159  BP: (!) 181/63 133/62  Pulse: (!) 59 69  Resp: 17 16  Temp: 36.7 C 36.4 C  SpO2: 100% 100%    Last Pain:  Vitals:   09/01/24 1159  TempSrc: Axillary  PainSc: 0-No pain                 [name]      "

## 2024-09-03 ENCOUNTER — Ambulatory Visit (INDEPENDENT_AMBULATORY_CARE_PROVIDER_SITE_OTHER): Payer: Self-pay | Admitting: Gastroenterology

## 2024-09-03 ENCOUNTER — Encounter (HOSPITAL_COMMUNITY): Payer: Self-pay | Admitting: Gastroenterology

## 2024-09-04 ENCOUNTER — Telehealth: Payer: Self-pay

## 2024-09-04 DIAGNOSIS — I10 Essential (primary) hypertension: Secondary | ICD-10-CM

## 2024-09-04 DIAGNOSIS — E119 Type 2 diabetes mellitus without complications: Secondary | ICD-10-CM

## 2024-09-04 NOTE — Progress Notes (Unsigned)
 Complex Care Management Note Care Guide Note  09/04/2024 Name: [name] [name] MRN: [id number] DOB: October 04, 1955   Complex Care Management Outreach Attempts: An unsuccessful telephone outreach was attempted today to offer the patient information about available complex care management services.  Follow Up Plan:  Additional outreach attempts will be made to offer the patient complex care management information and services.   Encounter Outcome:  No Answer-Left voicemail  Leotis Rase [facility], [facility] Guide  Direct Dial: [phone]  Fax [phone]

## 2024-09-06 ENCOUNTER — Observation Stay (HOSPITAL_COMMUNITY)
Admission: EM | Admit: 2024-09-06 | Discharge: 2024-09-09 | DRG: 442 | Disposition: A | Attending: Family Medicine | Admitting: Family Medicine

## 2024-09-06 ENCOUNTER — Emergency Department (HOSPITAL_COMMUNITY)

## 2024-09-06 ENCOUNTER — Other Ambulatory Visit: Payer: Self-pay

## 2024-09-06 DIAGNOSIS — C220 Liver cell carcinoma: Secondary | ICD-10-CM | POA: Diagnosis present

## 2024-09-06 DIAGNOSIS — Z8249 Family history of ischemic heart disease and other diseases of the circulatory system: Secondary | ICD-10-CM

## 2024-09-06 DIAGNOSIS — T473X6A Underdosing of saline and osmotic laxatives, initial encounter: Secondary | ICD-10-CM | POA: Diagnosis present

## 2024-09-06 DIAGNOSIS — K7469 Other cirrhosis of liver: Secondary | ICD-10-CM | POA: Diagnosis present

## 2024-09-06 DIAGNOSIS — E66811 Obesity, class 1: Secondary | ICD-10-CM | POA: Diagnosis present

## 2024-09-06 DIAGNOSIS — N1831 Chronic kidney disease, stage 3a: Secondary | ICD-10-CM | POA: Diagnosis present

## 2024-09-06 DIAGNOSIS — K219 Gastro-esophageal reflux disease without esophagitis: Secondary | ICD-10-CM | POA: Diagnosis present

## 2024-09-06 DIAGNOSIS — R680 Hypothermia, not associated with low environmental temperature: Secondary | ICD-10-CM | POA: Diagnosis present

## 2024-09-06 DIAGNOSIS — D696 Thrombocytopenia, unspecified: Secondary | ICD-10-CM | POA: Diagnosis not present

## 2024-09-06 DIAGNOSIS — R911 Solitary pulmonary nodule: Secondary | ICD-10-CM | POA: Diagnosis present

## 2024-09-06 DIAGNOSIS — Z8719 Personal history of other diseases of the digestive system: Secondary | ICD-10-CM

## 2024-09-06 DIAGNOSIS — Z833 Family history of diabetes mellitus: Secondary | ICD-10-CM

## 2024-09-06 DIAGNOSIS — Z6834 Body mass index (BMI) 34.0-34.9, adult: Secondary | ICD-10-CM

## 2024-09-06 DIAGNOSIS — K7682 Hepatic encephalopathy: Principal | ICD-10-CM | POA: Diagnosis present

## 2024-09-06 DIAGNOSIS — E860 Dehydration: Secondary | ICD-10-CM | POA: Diagnosis present

## 2024-09-06 DIAGNOSIS — Z91148 Patient's other noncompliance with medication regimen for other reason: Secondary | ICD-10-CM

## 2024-09-06 DIAGNOSIS — X58XXXA Exposure to other specified factors, initial encounter: Secondary | ICD-10-CM | POA: Diagnosis present

## 2024-09-06 DIAGNOSIS — E875 Hyperkalemia: Secondary | ICD-10-CM | POA: Diagnosis present

## 2024-09-06 DIAGNOSIS — Y92008 Other place in unspecified non-institutional (private) residence as the place of occurrence of the external cause: Secondary | ICD-10-CM

## 2024-09-06 DIAGNOSIS — E1122 Type 2 diabetes mellitus with diabetic chronic kidney disease: Secondary | ICD-10-CM | POA: Diagnosis present

## 2024-09-06 DIAGNOSIS — I129 Hypertensive chronic kidney disease with stage 1 through stage 4 chronic kidney disease, or unspecified chronic kidney disease: Secondary | ICD-10-CM | POA: Diagnosis present

## 2024-09-06 DIAGNOSIS — F17210 Nicotine dependence, cigarettes, uncomplicated: Secondary | ICD-10-CM | POA: Diagnosis present

## 2024-09-06 DIAGNOSIS — E872 Acidosis, unspecified: Secondary | ICD-10-CM | POA: Diagnosis present

## 2024-09-06 DIAGNOSIS — Z794 Long term (current) use of insulin: Secondary | ICD-10-CM

## 2024-09-06 DIAGNOSIS — S0081XA Abrasion of other part of head, initial encounter: Secondary | ICD-10-CM | POA: Diagnosis present

## 2024-09-06 DIAGNOSIS — Z7982 Long term (current) use of aspirin: Secondary | ICD-10-CM

## 2024-09-06 DIAGNOSIS — Z79899 Other long term (current) drug therapy: Secondary | ICD-10-CM

## 2024-09-06 DIAGNOSIS — Z7984 Long term (current) use of oral hypoglycemic drugs: Secondary | ICD-10-CM

## 2024-09-06 LAB — URINALYSIS, ROUTINE W REFLEX MICROSCOPIC
Bilirubin Urine: NEGATIVE
Glucose, UA: NEGATIVE mg/dL
Hgb urine dipstick: NEGATIVE
Ketones, ur: NEGATIVE mg/dL
Leukocytes,Ua: NEGATIVE
Nitrite: NEGATIVE
Protein, ur: NEGATIVE mg/dL
Specific Gravity, Urine: 1.017 (ref 1.005–1.030)
pH: 6 (ref 5.0–8.0)

## 2024-09-06 LAB — COMPREHENSIVE METABOLIC PANEL WITH GFR
ALT: 34 U/L (ref 0–44)
AST: 57 U/L — ABNORMAL HIGH (ref 15–41)
Albumin: 3.4 g/dL — ABNORMAL LOW (ref 3.5–5.0)
Alkaline Phosphatase: 216 U/L — ABNORMAL HIGH (ref 38–126)
Anion gap: 14 (ref 5–15)
BUN: 22 mg/dL (ref 8–23)
CO2: 20 mmol/L — ABNORMAL LOW (ref 22–32)
Calcium: 9.1 mg/dL (ref 8.9–10.3)
Chloride: 108 mmol/L (ref 98–111)
Creatinine, Ser: 1.69 mg/dL — ABNORMAL HIGH (ref 0.61–1.24)
GFR, Estimated: 44 mL/min — ABNORMAL LOW
Glucose, Bld: 99 mg/dL (ref 70–99)
Potassium: 5.5 mmol/L — ABNORMAL HIGH (ref 3.5–5.1)
Sodium: 142 mmol/L (ref 135–145)
Total Bilirubin: 2.5 mg/dL — ABNORMAL HIGH (ref 0.0–1.2)
Total Protein: 7.8 g/dL (ref 6.5–8.1)

## 2024-09-06 LAB — CBC WITH DIFFERENTIAL/PLATELET
Abs Immature Granulocytes: 0.04 10*3/uL (ref 0.00–0.07)
Basophils Absolute: 0 10*3/uL (ref 0.0–0.1)
Basophils Relative: 1 %
Eosinophils Absolute: 0.1 10*3/uL (ref 0.0–0.5)
Eosinophils Relative: 1 %
HCT: 35.5 % — ABNORMAL LOW (ref 39.0–52.0)
Hemoglobin: 12.1 g/dL — ABNORMAL LOW (ref 13.0–17.0)
Immature Granulocytes: 1 %
Lymphocytes Relative: 8 %
Lymphs Abs: 0.5 10*3/uL — ABNORMAL LOW (ref 0.7–4.0)
MCH: 33.2 pg (ref 26.0–34.0)
MCHC: 34.1 g/dL (ref 30.0–36.0)
MCV: 97.3 fL (ref 80.0–100.0)
Monocytes Absolute: 0.6 10*3/uL (ref 0.1–1.0)
Monocytes Relative: 10 %
Neutro Abs: 5.1 10*3/uL (ref 1.7–7.7)
Neutrophils Relative %: 79 %
Platelets: 104 10*3/uL — ABNORMAL LOW (ref 150–400)
RBC: 3.65 MIL/uL — ABNORMAL LOW (ref 4.22–5.81)
RDW: 14.4 % (ref 11.5–15.5)
WBC: 6.4 10*3/uL (ref 4.0–10.5)
nRBC: 0 % (ref 0.0–0.2)

## 2024-09-06 LAB — CBG MONITORING, ED
Glucose-Capillary: 107 mg/dL — ABNORMAL HIGH (ref 70–99)
Glucose-Capillary: 73 mg/dL (ref 70–99)
Glucose-Capillary: 63 mg/dL — ABNORMAL LOW (ref 70–99)
Glucose-Capillary: 89 mg/dL (ref 70–99)

## 2024-09-06 LAB — AMMONIA: Ammonia: 178 umol/L — ABNORMAL HIGH (ref 9–35)

## 2024-09-06 LAB — PROTIME-INR
INR: 1.1 (ref 0.8–1.2)
Prothrombin Time: 14.7 s (ref 11.4–15.2)

## 2024-09-06 LAB — HEMOGLOBIN A1C
Hgb A1c MFr Bld: 5.6 % (ref 4.8–5.6)
Mean Plasma Glucose: 114.02 mg/dL

## 2024-09-06 LAB — ETHANOL: Alcohol, Ethyl (B): <15 mg/dL

## 2024-09-06 LAB — LACTIC ACID, PLASMA: Lactic Acid, Venous: 3.5 mmol/L (ref 0.5–1.9)

## 2024-09-06 MED ORDER — ACETAMINOPHEN 650 MG RE SUPP: 650.0000 mg | Freq: Four times a day (QID) | RECTAL | Status: DC | PRN

## 2024-09-06 MED ORDER — PANTOPRAZOLE SODIUM 40 MG PO TBEC
40.0000 mg | DELAYED_RELEASE_TABLET | Freq: Two times a day (BID) | ORAL | Status: DC
  Administered 2024-09-06 – 2024-09-09 (×7): 40 mg via ORAL
  Filled 2024-09-06 (×7): qty 1

## 2024-09-06 MED ORDER — LACTULOSE 10 GM/15ML PO SOLN
20.0000 g | Freq: Three times a day (TID) | ORAL | Status: DC
  Administered 2024-09-06 – 2024-09-09 (×9): 20 g via ORAL
  Filled 2024-09-06 (×9): qty 30

## 2024-09-06 MED ORDER — ONDANSETRON HCL 4 MG/2ML IJ SOLN: 4.0000 mg | Freq: Four times a day (QID) | INTRAMUSCULAR | Status: DC | PRN

## 2024-09-06 MED ORDER — LACTATED RINGERS IV BOLUS
1000.0000 mL | Freq: Once | INTRAVENOUS | Status: AC
  Administered 2024-09-06: 1000 mL via INTRAVENOUS

## 2024-09-06 MED ORDER — OXYCODONE HCL 5 MG PO TABS
5.0000 mg | ORAL_TABLET | Freq: Three times a day (TID) | ORAL | Status: DC | PRN
  Administered 2024-09-06: 5 mg via ORAL
  Filled 2024-09-06: qty 1

## 2024-09-06 MED ORDER — ONDANSETRON HCL 4 MG PO TABS: 4.0000 mg | ORAL_TABLET | Freq: Four times a day (QID) | ORAL | Status: DC | PRN

## 2024-09-06 MED ORDER — RIFAXIMIN 550 MG PO TABS
550.0000 mg | ORAL_TABLET | Freq: Two times a day (BID) | ORAL | Status: DC
  Administered 2024-09-06 – 2024-09-09 (×7): 550 mg via ORAL
  Filled 2024-09-06 (×7): qty 1

## 2024-09-06 MED ORDER — ACETAMINOPHEN 325 MG PO TABS: 650.0000 mg | ORAL_TABLET | Freq: Four times a day (QID) | ORAL | Status: DC | PRN

## 2024-09-06 MED ORDER — ONDANSETRON HCL 4 MG/2ML IJ SOLN
4.0000 mg | Freq: Once | INTRAMUSCULAR | Status: AC
  Administered 2024-09-06: 4 mg via INTRAVENOUS
  Filled 2024-09-06: qty 2

## 2024-09-06 MED ORDER — SODIUM CHLORIDE 0.9 % IV SOLN: INTRAVENOUS | Status: DC

## 2024-09-06 MED ORDER — INSULIN ASPART 100 UNIT/ML IJ SOLN
0.0000 [IU] | Freq: Three times a day (TID) | INTRAMUSCULAR | Status: DC
  Administered 2024-09-07 – 2024-09-08 (×2): 2 [IU] via SUBCUTANEOUS
  Administered 2024-09-09: 1 [IU] via SUBCUTANEOUS
  Filled 2024-09-06: qty 2
  Filled 2024-09-06 (×2): qty 1

## 2024-09-06 MED ORDER — INSULIN ASPART 100 UNIT/ML IJ SOLN: 0.0000 [IU] | Freq: Every day | INTRAMUSCULAR | Status: DC

## 2024-09-06 MED ORDER — SODIUM ZIRCONIUM CYCLOSILICATE 5 G PO PACK
5.0000 g | PACK | Freq: Once | ORAL | Status: DC
  Filled 2024-09-06: qty 1

## 2024-09-06 MED ORDER — METHOCARBAMOL 500 MG PO TABS: 500.0000 mg | ORAL_TABLET | Freq: Three times a day (TID) | ORAL | Status: DC | PRN

## 2024-09-06 MED ORDER — LINACLOTIDE 145 MCG PO CAPS: 145.0000 ug | ORAL_CAPSULE | Freq: Every day | ORAL | Status: DC

## 2024-09-06 MED ORDER — CARVEDILOL 6.25 MG PO TABS
6.2500 mg | ORAL_TABLET | Freq: Two times a day (BID) | ORAL | Status: DC
  Administered 2024-09-06 – 2024-09-09 (×5): 6.25 mg via ORAL
  Filled 2024-09-06 (×2): qty 1
  Filled 2024-09-06 (×2): qty 2
  Filled 2024-09-06: qty 1

## 2024-09-06 MED ORDER — IOHEXOL 300 MG/ML  SOLN
80.0000 mL | Freq: Once | INTRAMUSCULAR | Status: AC | PRN
  Administered 2024-09-06: 80 mL via INTRAVENOUS

## 2024-09-06 MED ORDER — TETANUS-DIPHTH-ACELL PERTUSSIS 5-2-15.5 LF-MCG/0.5 IM SUSP
0.5000 mL | Freq: Once | INTRAMUSCULAR | Status: AC
  Administered 2024-09-06: 0.5 mL via INTRAMUSCULAR
  Filled 2024-09-06: qty 0.5

## 2024-09-06 MED ORDER — CLONIDINE HCL 0.2 MG PO TABS
0.3000 mg | ORAL_TABLET | Freq: Three times a day (TID) | ORAL | Status: DC
  Administered 2024-09-06 – 2024-09-09 (×9): 0.3 mg via ORAL
  Filled 2024-09-06: qty 3
  Filled 2024-09-06 (×2): qty 1
  Filled 2024-09-06: qty 3
  Filled 2024-09-06: qty 1
  Filled 2024-09-06 (×4): qty 3

## 2024-09-06 MED ORDER — LACTULOSE 10 GM/15ML PO SOLN
30.0000 g | Freq: Once | ORAL | Status: AC
  Administered 2024-09-06: 30 g via ORAL
  Filled 2024-09-06: qty 60

## 2024-09-06 NOTE — H&P (Signed)
 " History and Physical    Patient: [name] [name] FMW:[id number] DOB: 06-Dec-1955 DOA: 09/06/2024 DOS: the patient was seen and examined on 09/06/2024 PCP: Sheryle Carwin, MD   Patient coming from: Home  Chief Complaint:  Chief Complaint  Patient presents with   Fall   Altered Mental Status   HPI: [name] [name] is a 69 y.o. male with medical history significant of hepatocellular carcinoma, cryptogenic cirrhosis, type 2 diabetes, GERD, hypertension, class I obesity and thrombocytopenia; who presented to the hospital secondary to altered mental status and mechanical fall.  Family reported last seen normal on Friday and unsure how exactly he fell.  Patient did not have any recollection of events and appears to be oriented to person and place only. On his arrival to the ED was found to be hypothermic; and workup suggesting the presence of dehydration.  Patient ammonia level of 178; with concern for hepatic encephalopathy process.  CT head without acute intracranial normalities; CT chest abdomen pelvis and CT maxillofacial not demonstrating any acute fractures or acute infection concerns.  Fluid resuscitation given, ammonia started and [facility] contacted to bring patient to hospital for further evaluation and management.  Review of Systems: As mentioned in the history of present illness. All other systems reviewed and are negative. Past Medical History:  Diagnosis Date   Anemia    Cryptogenic cirrhosis (HCC)    Diabetes mellitus    GERD (gastroesophageal reflux disease)    Hypertension    Liver disease    Past Surgical History:  Procedure Laterality Date   COLONOSCOPY     COLONOSCOPY N/A 09/01/2024   Procedure: COLONOSCOPY;  Surgeon: Cinderella Deatrice FALCON, MD;  [facility];  Service: Endoscopy;  Laterality: N/A;  1245pm, any room   ESOPHAGOGASTRODUODENOSCOPY N/A 09/01/2024   Procedure: EGD (ESOPHAGOGASTRODUODENOSCOPY);  Surgeon: Cinderella Deatrice FALCON, MD;  Location: [facility];  Service:  Endoscopy;  Laterality: N/A;   INCISION AND DRAINAGE OF WOUND Right 12/28/2016   Procedure: IRRIGATION AND DEBRIDEMENT WOUND;  Surgeon: Murrell Drivers, MD;  Location: [facility];  Service: Orthopedics;  Laterality: Right;   IR RADIOLOGIST EVAL & MGMT  10/08/2018   LIVER BIOPSY     NERVE, TENDON AND ARTERY REPAIR Right 12/28/2016   Procedure: NERVE, TENDON AND ARTERY REPAIR;  Surgeon: Murrell Drivers, MD;  Location: [facility];  Service: Orthopedics;  Laterality: Right;   PERCUTANEOUS PINNING Right 12/28/2016   Procedure: PERCUTANEOUS PINNING EXTREMITY;  Surgeon: Murrell Drivers, MD;  Location: [facility];  Service: Orthopedics;  Laterality: Right;   POLYPECTOMY  09/01/2024   Procedure: POLYPECTOMY, INTESTINE;  Surgeon: Cinderella Deatrice FALCON, MD;  Location: [facility];  Service: Endoscopy;;   TIPS PROCEDURE  2008   UPPER GASTROINTESTINAL ENDOSCOPY     Social History:  reports that he has been smoking cigarettes. He started smoking about 43 years ago. He has a 43.6 pack-year smoking history. He has never used smokeless tobacco. He reports that he does not drink alcohol and does not use drugs.  Allergies[1]  Family History  Problem Relation Age of Onset   Healthy Mother    Diabetes Father    Heart disease Father    Healthy Sister    Healthy Sister     Prior to Admission medications  Medication Sig Start Date End Date Taking? Authorizing Provider  ACCU-CHEK AVIVA PLUS test strip 1 each by Other route as needed. 08/07/22   [provider]  amLODipine  (NORVASC ) 10 MG  tablet Take 1 tablet (10 mg total) by mouth at bedtime. 11/30/19   Richarda Prentice LITTIE Mickey., NP  aspirin EC 81 MG tablet Take 81 mg by mouth daily. Swallow whole.    [provider]  carvedilol  (COREG ) 6.25 MG tablet Take 6.25 mg by mouth 2 (two) times daily. One daily    [provider]  cloNIDine  (CATAPRES ) 0.3 MG tablet Take 0.3 mg by mouth 3 (three) times daily. 08/14/19    [provider]  furosemide  (LASIX ) 40 MG tablet Take 40 mg by mouth daily. 12/28/16   [provider]  glimepiride  (AMARYL ) 4 MG tablet Take 4 mg by mouth daily before breakfast.    [provider]  lactulose  (CHRONULAC ) 10 GM/15ML solution Take 45 mLs (30 g total) by mouth daily. 3 tablespoons two times a day Patient taking differently: Take 30 g by mouth daily. 2 tablespoons two times a day 05/11/24 11/07/24  Carlan, Mitzie L, NP  linaclotide  (LINZESS ) 145 MCG CAPS capsule Take 1 capsule (145 mcg total) by mouth daily. Patient not taking: Reported on 07/27/2024 02/19/24 08/17/24  Cinderella Deatrice FALCON, MD  losartan  (COZAAR ) 100 MG tablet Take 100 mg by mouth daily. 08/14/19   [provider]  omeprazole (PRILOSEC) 20 MG capsule TAKE 1 CAPSULE BY MOUTH EVERY MORNING FOR ACID REFLUX. 07/05/14   Rehman, Claudis PENNER, MD  OVER THE COUNTER MEDICATION Radiation by [facility].    [provider]  PRESCRIPTION MEDICATION Eye injection every 4 months    [provider]  rifaximin  (XIFAXAN ) 550 MG TABS tablet Take 550 mg by mouth. 06/29/24 12/26/24  [provider]  SURE COMFORT PEN NEEDLES 31G X 8 MM MISC  08/21/22   [provider]  TRESIBA FLEXTOUCH 100 UNIT/ML FlexTouch Pen Inject 32 Units into the skin daily. Patient taking differently: Inject 25 Units into the skin daily. 08/21/22   [provider]  zolpidem (AMBIEN) 10 MG tablet Take 10 mg by mouth at bedtime as needed. Patient not taking: No sig reported 04/29/24   [provider]    Physical Exam: Vitals:   09/06/24 0943 09/06/24 1010 09/06/24 1045 09/06/24 1100  BP: (!) 152/131  (!) 179/85 (!) 166/77  Pulse: 87  83 79  Resp: 17  19 12   Temp: (!) 96.1 F (35.6 C)     TempSrc: Rectal     SpO2: 97% 97% 100% 100%  Weight:      Height:       General exam: Alert, awake and oriented x 2; no chest pain, no fever. Respiratory system: Good saturation on room  air. Cardiovascular system:RRR. No murmurs, rubs, gallops. Gastrointestinal system: Abdomen is nondistended, soft and nontender. No organomegaly or masses felt. Normal bowel sounds heard. Central nervous system: Moving 4 limbs spontaneously.  No focal neurological deficits. Extremities: No cyanosis or clubbing. Skin: No petechiae. Psychiatry: Judgement and insight appear normal. Mood & affect appropriate.   Data Reviewed: Comprehensive metabolic panel: Sodium 142, potassium 5.5, chloride 108, bicarb 20, BUN 22, creatinine 1.69, AST 57, ALT 34, alk phos 216 and GFR 44 Ammonia: 178 Lactic acid: 3.5 CBC: WBC 6.4, hemoglobin 12.1 and platelet count 104K  Assessment and Plan: 1-acute hepatic encephalopathy - Most likely in the setting of lactulose  noncompliance - Fluid resuscitation has been initiated - Lactulose  and rifaximin  has been resumed - Follow electrolytes trend and continue supportive care - Will minimize sedative agents - Follow clinical response. - CT head without acute abnormality - Follow ammonia  level trend.  2-nausea/vomiting/GERD - As needed antiemetics and PPI will be provided - Full liquid diet initiated - Provide fluid resuscitation - Advance diet as tolerated - Continue supportive care.  3-hyperkalemia - Lokelma  x 1 provided - No acute abnormalities appreciated on EKG - Telemetry monitoring will be provide - Continue to maintain adequate fluid resuscitation - Follow electrolytes trend.  4-dehydration/lactic acidosis - Will provide fluid resuscitation and follow lactic acid trend. - No signs or source identified for acute infection. - Will hold on antibiotics at the moment.  5-history of cirrhosis - Patient with cryptogenic cirrhosis status post TIPS - Will continue the use of PPI and lactulose /rifaximin  as mentioned above - Continue patient follow-up with gastroenterology service - Overall condition appears to be stable - Holding diuretics initially in  the setting of dehydration process - Resumed when appropriate.  6-hypertension - Planning to resume the use of beta-blocker and clonidine  to minimize rebound hypertension and tachycardia. - Follow vital signs.  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  Advance Care Planning:   Code Status: Full Code   Consults: None  Family Communication: See below for x-ray reports.  Severity of Illness: The appropriate patient status for this patient is OBSERVATION. Observation status is judged to be reasonable and necessary in order to provide the required intensity of service to ensure the patient's safety. The patient's presenting symptoms, physical exam findings, and initial radiographic and laboratory data in the context of their medical condition is felt to place them at decreased risk for further clinical deterioration. Furthermore, it is anticipated that the patient will be medically stable for discharge from the hospital within 2 midnights of admission.   Author: Eric Nunnery, MD 09/06/2024 12:46 PM  For on call review [email or web address].      [1] No Known Allergies  "
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
## 2024-09-06 NOTE — ED Notes (Signed)
 Pt cbg at 2138 was 63, snack and soda provided, rechecked cbg at 2230 was 89.

## 2024-09-06 NOTE — Progress Notes (Signed)
 IN ED, continuing workup, PT eval pending. IPCM following.     09/06/24 1452  TOC Brief Assessment  Insurance and Status Reviewed  Patient has primary care physician Yes  Home environment has been reviewed Home alone  Prior level of function: independent  Prior/Current Home Services No current home services  Social Drivers of Health Review SDOH reviewed no interventions necessary  Readmission risk has been reviewed Yes  Transition of care needs no transition of care needs at this time   Inpatient Care Manager (ICM) has reviewed patient and no ICM needs have been identified at this time. We will continue to monitor patient advancement through interdisciplinary progression rounds. If new patient transition needs arise, please place a ICM consult.

## 2024-09-06 NOTE — ED Notes (Signed)
 Patient transported to CT

## 2024-09-07 LAB — CBC
HCT: 33.1 % — ABNORMAL LOW (ref 39.0–52.0)
Hemoglobin: 11.2 g/dL — ABNORMAL LOW (ref 13.0–17.0)
MCH: 33.1 pg (ref 26.0–34.0)
MCHC: 33.8 g/dL (ref 30.0–36.0)
MCV: 97.9 fL (ref 80.0–100.0)
Platelets: 88 10*3/uL — ABNORMAL LOW (ref 150–400)
RBC: 3.38 MIL/uL — ABNORMAL LOW (ref 4.22–5.81)
RDW: 14.6 % (ref 11.5–15.5)
WBC: 5.9 10*3/uL (ref 4.0–10.5)
nRBC: 0 % (ref 0.0–0.2)

## 2024-09-07 LAB — COMPREHENSIVE METABOLIC PANEL WITH GFR
ALT: 29 U/L (ref 0–44)
AST: 58 U/L — ABNORMAL HIGH (ref 15–41)
Albumin: 2.8 g/dL — ABNORMAL LOW (ref 3.5–5.0)
Alkaline Phosphatase: 157 U/L — ABNORMAL HIGH (ref 38–126)
Anion gap: 11 (ref 5–15)
BUN: 25 mg/dL — ABNORMAL HIGH (ref 8–23)
CO2: 23 mmol/L (ref 22–32)
Calcium: 8.6 mg/dL — ABNORMAL LOW (ref 8.9–10.3)
Chloride: 113 mmol/L — ABNORMAL HIGH (ref 98–111)
Creatinine, Ser: 1.61 mg/dL — ABNORMAL HIGH (ref 0.61–1.24)
GFR, Estimated: 46 mL/min — ABNORMAL LOW
Glucose, Bld: 129 mg/dL — ABNORMAL HIGH (ref 70–99)
Potassium: 4.2 mmol/L (ref 3.5–5.1)
Sodium: 147 mmol/L — ABNORMAL HIGH (ref 135–145)
Total Bilirubin: 1.9 mg/dL — ABNORMAL HIGH (ref 0.0–1.2)
Total Protein: 6.4 g/dL — ABNORMAL LOW (ref 6.5–8.1)

## 2024-09-07 LAB — CBG MONITORING, ED
Glucose-Capillary: 90 mg/dL (ref 70–99)
Glucose-Capillary: 116 mg/dL — ABNORMAL HIGH (ref 70–99)
Glucose-Capillary: 153 mg/dL — ABNORMAL HIGH (ref 70–99)

## 2024-09-07 LAB — HIV ANTIBODY (ROUTINE TESTING W REFLEX): HIV Screen 4th Generation wRfx: NONREACTIVE

## 2024-09-07 LAB — GLUCOSE, CAPILLARY
Glucose-Capillary: 102 mg/dL — ABNORMAL HIGH (ref 70–99)
Glucose-Capillary: 108 mg/dL — ABNORMAL HIGH (ref 70–99)

## 2024-09-07 LAB — AMMONIA: Ammonia: 139 umol/L — ABNORMAL HIGH (ref 9–35)

## 2024-09-07 LAB — LACTIC ACID, PLASMA: Lactic Acid, Venous: 2 mmol/L (ref 0.5–1.9)

## 2024-09-07 MED ORDER — AMLODIPINE BESYLATE 10 MG PO TABS
10.0000 mg | ORAL_TABLET | Freq: Every day | ORAL | Status: DC
  Administered 2024-09-07 – 2024-09-08 (×2): 10 mg via ORAL
  Filled 2024-09-07 (×2): qty 1

## 2024-09-07 MED ORDER — HYDRALAZINE HCL 25 MG PO TABS: 25.0000 mg | ORAL_TABLET | Freq: Four times a day (QID) | ORAL | Status: DC | PRN

## 2024-09-07 MED ORDER — SODIUM CHLORIDE 0.9 % IV SOLN: INTRAVENOUS | Status: DC

## 2024-09-07 NOTE — Plan of Care (Signed)
   Problem: Fluid Volume: Goal: Ability to maintain a balanced intake and output will improve Outcome: Progressing   Problem: Metabolic: Goal: Ability to maintain appropriate glucose levels will improve Outcome: Progressing   Problem: Skin Integrity: Goal: Risk for impaired skin integrity will decrease Outcome: Progressing

## 2024-09-07 NOTE — ED Notes (Signed)
[facility] called to transport patient. Nurse aware.

## 2024-09-07 NOTE — Inpatient Diabetes Management (Signed)
 Inpatient Diabetes Program Recommendations  AACE/ADA: New Consensus Statement on Inpatient Glycemic Control (2015)  Target Ranges:  Prepandial:   less than 140 mg/dL      Peak postprandial:   less than 180 mg/dL (1-2 hours)      Critically ill patients:  140 - 180 mg/dL   Lab Results  Component Value Date   GLUCAP 116 (H) 09/07/2024   HGBA1C 5.6 09/06/2024    Review of Glycemic Control  Diabetes history: DM2 Outpatient Diabetes medications:  Tresiba 32 units daily Amaryl  4 mg daily Current orders for Inpatient glycemic control: Novolog  0-9 units tid, 0-5 units hs  Inpatient Diabetes Program Recommendations:   Noted patient in ED with Hypoglycemia.  A1c 5.6 which indicates patient with tight glycemic control. May need decrease in Amaryl  and insulin  @ discharge. CGM may also be helpful.  Thank you, Aleeha Boline E. Michaelia Beilfuss, RN, MSN, CNS, CDCES  Diabetes Coordinator Inpatient Glycemic Control Team Team Pager [phone] (8am-5pm) 09/07/2024 11:38 AM

## 2024-09-07 NOTE — Progress Notes (Signed)
 "  Progress Note   Patient: [name] [name] FMW:[id number] DOB: 1955-08-20 DOA: 09/06/2024     0 DOS: the patient was seen and examined on 09/07/2024   Brief hospital course: [name] [name] is a 69 y.o. male with medical history significant of hepatocellular carcinoma, cryptogenic cirrhosis, type 2 diabetes, GERD, hypertension, class I obesity and thrombocytopenia; who presented to the hospital secondary to altered mental status and mechanical fall.  Family reported last seen normal on Friday and unsure how exactly he fell.  Patient did not have any recollection of events and appears to be oriented to person and place only. On his arrival to the ED was found to be hypothermic; and workup suggesting the presence of dehydration.  Patient ammonia level of 178; with concern for hepatic encephalopathy process.   CT head without acute intracranial normalities; CT chest abdomen pelvis and CT maxillofacial not demonstrating any acute fractures or acute infection concerns.   Fluid resuscitation given, ammonia started and [facility] contacted to bring patient to hospital for further evaluation and management.  Assessment and plan 1-acute hepatic encephalopathy -Improving - Ammonia level trending down - Patient less confused but is still mentation not back to baseline - Continue minimizing sedative agents - Continue maintaining adequate hydration - Continue the use of lactulose  and rifaximin  - Follow clinical response.  2-mechanical fall/physical deconditioning - Patient seen by physical therapy with initial recommendation for short-term rehab - Family hoping that he will continue to get better and able to go home with home health services - Please continue to reassess clinical response and stability for discharge pathways.  3-hyperkalemia - Improved/resolved - Continue to follow electrolytes intermittently.  4-nausea/vomiting/GERD - Continue the use of PPI and as needed antiemetics - Will advance diet  to soft - Continue supportive care and follow response.  5-dehydration/lactic acidosis - Lactic acid down to 2.0 after fluid resuscitation - Maintain adequate hydration - Continue supportive care. - No acute infection appreciated - No antibiotics needed.  6-history of cirrhosis/prior history of liver cancer - Continue patient follow-up with gastroenterology service - Patient with cryptogenic cirrhosis status post TIPS - Continue PPI, lactulose  and rifaximin  - LFTs adequately trending down and no suggesting any further decompensation.  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  8-hypertension - Continue home antihypertensive agent - Follow vital signs.  9-class I obesity - Low-calorie diet and portion control discussed with patient -Body mass index is 34.13 kg/m.    Subjective:  No fever, no chest pain, no nausea, no vomiting.  Mentation improving but is still not back to baseline.  Physical Exam: Vitals:   09/07/24 0929 09/07/24 0933 09/07/24 1123 09/07/24 1126  BP: (!) 141/76  (!) 143/60   Pulse:  64 (!) 59   Resp:  11 13   Temp:    98.4 F (36.9 C)  TempSrc:    Oral  SpO2:  97% 96%   Weight:      Height:       General exam: Alert, awake, oriented x 1-2; with still poor insight.   Respiratory system: Clear to auscultation. Respiratory effort normal.  Good saturation on room air. Cardiovascular system:RRR. No murmurs, rubs, gallops. Gastrointestinal system: Abdomen is obese nondistended, soft and nontender. No organomegaly or masses felt. Normal  bowel sounds heard. Central nervous system: Moving 4 limbs spontaneously.  No focal neurological deficits. Extremities: No cyanosis or clubbing. Skin: No petechiae;  multiple bruises and abrasion from recent falls appreciated.  No open wounds or signs of superimposed infection. Psychiatry: Judgement and insight appear impaired secondary to encephalopathy.   Data Reviewed: Comprehensive metabolic panel: Sodium 147, potassium 4.2, chloride 113, bicarb 23, BUN 25, creatinine 1.61, AST 58, ALT 29, alk phos 157 and GFR 46 Ammonia: 138 CBC: WBCs 5.9, hemoglobin 11.2 and platelet count 88K Lactic acid: 2.0  Family Communication: Sister at bedside.  Disposition: Status is: Inpatient Remains inpatient appropriate because: Continue IV therapy and treatment for acute encephalopathy.  Time spent: 50 minutes  Author: Eric Nunnery, MD 09/07/2024 11:47 AM  For on call review [email or web address].    "

## 2024-09-08 LAB — LACTIC ACID, PLASMA: Lactic Acid, Venous: 1.5 mmol/L (ref 0.5–1.9)

## 2024-09-08 LAB — COMPREHENSIVE METABOLIC PANEL WITH GFR
ALT: 28 U/L (ref 0–44)
AST: 61 U/L — ABNORMAL HIGH (ref 15–41)
Albumin: 2.7 g/dL — ABNORMAL LOW (ref 3.5–5.0)
Alkaline Phosphatase: 144 U/L — ABNORMAL HIGH (ref 38–126)
Anion gap: 8 (ref 5–15)
BUN: 20 mg/dL (ref 8–23)
CO2: 22 mmol/L (ref 22–32)
Calcium: 8.4 mg/dL — ABNORMAL LOW (ref 8.9–10.3)
Chloride: 114 mmol/L — ABNORMAL HIGH (ref 98–111)
Creatinine, Ser: 1.55 mg/dL — ABNORMAL HIGH (ref 0.61–1.24)
GFR, Estimated: 48 mL/min — ABNORMAL LOW
Glucose, Bld: 82 mg/dL (ref 70–99)
Potassium: 3.9 mmol/L (ref 3.5–5.1)
Sodium: 143 mmol/L (ref 135–145)
Total Bilirubin: 1.4 mg/dL — ABNORMAL HIGH (ref 0.0–1.2)
Total Protein: 5.9 g/dL — ABNORMAL LOW (ref 6.5–8.1)

## 2024-09-08 LAB — GLUCOSE, CAPILLARY
Glucose-Capillary: 82 mg/dL (ref 70–99)
Glucose-Capillary: 145 mg/dL — ABNORMAL HIGH (ref 70–99)
Glucose-Capillary: 158 mg/dL — ABNORMAL HIGH (ref 70–99)
Glucose-Capillary: 104 mg/dL — ABNORMAL HIGH (ref 70–99)

## 2024-09-08 LAB — AMMONIA: Ammonia: 111 umol/L — ABNORMAL HIGH (ref 9–35)

## 2024-09-08 MED ORDER — SODIUM CHLORIDE 0.9 % IV SOLN: INTRAVENOUS | Status: AC

## 2024-09-08 NOTE — TOC Initial Note (Addendum)
 Transition of Care [facility]) - Initial/Assessment Note    Patient Details  Name: [name] [name] MRN: [id number] Date of Birth: 1956/05/20  Transition [facility] CM/SW Contact:    Bascom Service, RN Phone Number: 09/08/2024, 11:44 AM  Clinical Narrative: Patient from home alone-has good supportive family live close by. Has cane,rw. Per nsg he declines recc ST SNF prefers home. Continue to monitor.   -12:18p-confirmed w/patient/Patsy(sister) declines ST SNF/HHC. Has own transport home.                Expected Discharge Plan:  (TBD) Barriers to Discharge: Continued Medical Work up   Patient Goals and CMS Choice            Expected Discharge Plan and Services                                              Prior Living Arrangements/Services                       Activities of Daily Living   ADL Screening (condition at time of admission) Independently performs ADLs?: No Does the patient have a NEW difficulty with bathing/dressing/toileting/self-feeding that is expected to last >3 days?: Yes (Initiates electronic notice to provider for possible OT consult) Does the patient have a NEW difficulty with getting in/out of bed, walking, or climbing stairs that is expected to last >3 days?: Yes (Initiates electronic notice to provider for possible PT consult) Does the patient have a NEW difficulty with communication that is expected to last >3 days?: No Is the patient deaf or have difficulty hearing?: No Does the patient have difficulty seeing, even when wearing glasses/contacts?: Yes Does the patient have difficulty concentrating, remembering, or making decisions?: Yes  Permission Sought/Granted                  Emotional Assessment              Admission diagnosis:  Hepatic encephalopathy (HCC) [K76.82] Patient Active Problem List   Diagnosis Date Noted   Hepatic encephalopathy (HCC) 09/06/2024   Adenomatous polyp of sigmoid colon 09/01/2024    Gastritis and gastroduodenitis 09/01/2024   Chronic idiopathic constipation 02/20/2024   Hepatocellular carcinoma (HCC) 12/25/2023   Continuous dependence on cigarette smoking 12/25/2023   Liver lesion 12/03/2023   Liver cirrhosis (HCC) 12/03/2023   Colon cancer screening 12/03/2023   CKD (chronic kidney disease), stage III (HCC) 10/23/2022   Pernicious anemia 10/23/2022   Wound of left leg, sequela 05/02/2022   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/2022   Ulcer of right lower extremity (HCC) 02/07/2022   Acute cholecystitis 09/25/2018   Tobacco use 09/25/2018   Hypophosphatemia 09/25/2018   Calculus of gallbladder with acute cholecystitis and obstruction    Essential (primary) hypertension 01/28/2013   Controlled diabetes mellitus type II without complication (HCC) 04/21/2012   Cryptogenic cirrhosis (HCC) 10/23/2011   DM (diabetes mellitus) (HCC) 10/23/2011   GERD (gastroesophageal reflux disease) 10/23/2011   S/P TIPS (transjugular intrahepatic portosystemic shunt) 10/23/2011   PCP:  Sheryle Carwin, MD Pharmacy:   [facility] - [place], [place] - [address] [place] [address] Phone: [phone] Fax: [phone]  [facility] [phone] - [place], [place] - [address] AT [facility] [address] [place] [address]  Phone: [phone] Fax: [phone]     Social Drivers of Health (SDOH) Social History: SDOH Screenings   Food Insecurity: No Food Insecurity (09/07/2024)  Housing: Low Risk (09/07/2024)  Transportation Needs: No Transportation Needs (09/07/2024)  Utilities: Not At Risk (09/07/2024)  Depression (PHQ2-9): Low Risk (05/26/2024)  Social Connections: Patient Unable To Answer (09/07/2024)  Tobacco Use: High Risk (09/01/2024)   SDOH Interventions:     Readmission Risk Interventions     No data to display

## 2024-09-09 LAB — CBC
HCT: 33.6 % — ABNORMAL LOW (ref 39.0–52.0)
Hemoglobin: 10.9 g/dL — ABNORMAL LOW (ref 13.0–17.0)
MCH: 33.1 pg (ref 26.0–34.0)
MCHC: 32.4 g/dL (ref 30.0–36.0)
MCV: 102.1 fL — ABNORMAL HIGH (ref 80.0–100.0)
Platelets: 95 10*3/uL — ABNORMAL LOW (ref 150–400)
RBC: 3.29 MIL/uL — ABNORMAL LOW (ref 4.22–5.81)
RDW: 14.1 % (ref 11.5–15.5)
WBC: 3.7 10*3/uL — ABNORMAL LOW (ref 4.0–10.5)
nRBC: 0 % (ref 0.0–0.2)

## 2024-09-09 LAB — COMPREHENSIVE METABOLIC PANEL WITH GFR
ALT: 30 U/L (ref 0–44)
AST: 64 U/L — ABNORMAL HIGH (ref 15–41)
Albumin: 2.6 g/dL — ABNORMAL LOW (ref 3.5–5.0)
Alkaline Phosphatase: 155 U/L — ABNORMAL HIGH (ref 38–126)
Anion gap: 9 (ref 5–15)
BUN: 19 mg/dL (ref 8–23)
CO2: 19 mmol/L — ABNORMAL LOW (ref 22–32)
Calcium: 8.2 mg/dL — ABNORMAL LOW (ref 8.9–10.3)
Chloride: 112 mmol/L — ABNORMAL HIGH (ref 98–111)
Creatinine, Ser: 1.51 mg/dL — ABNORMAL HIGH (ref 0.61–1.24)
GFR, Estimated: 50 mL/min — ABNORMAL LOW
Glucose, Bld: 121 mg/dL — ABNORMAL HIGH (ref 70–99)
Potassium: 4.2 mmol/L (ref 3.5–5.1)
Sodium: 139 mmol/L (ref 135–145)
Total Bilirubin: 1.5 mg/dL — ABNORMAL HIGH (ref 0.0–1.2)
Total Protein: 6.3 g/dL — ABNORMAL LOW (ref 6.5–8.1)

## 2024-09-09 LAB — GLUCOSE, CAPILLARY: Glucose-Capillary: 125 mg/dL — ABNORMAL HIGH (ref 70–99)

## 2024-09-09 LAB — MAGNESIUM: Magnesium: 2.2 mg/dL (ref 1.7–2.4)

## 2024-09-09 LAB — PHOSPHORUS: Phosphorus: 2.1 mg/dL — ABNORMAL LOW (ref 2.5–4.6)

## 2024-09-09 LAB — AMMONIA: Ammonia: 164 umol/L — ABNORMAL HIGH (ref 9–35)

## 2024-09-09 MED ORDER — LACTULOSE 10 GM/15ML PO SOLN: 20.0000 g | Freq: Three times a day (TID) | ORAL | Status: AC

## 2024-09-09 NOTE — Plan of Care (Signed)

## 2024-09-09 NOTE — Progress Notes (Signed)
 5 yr TCS noted in recall Patient result letter mailed procedure note and pathology result faxed to PCP

## 2024-09-09 NOTE — Progress Notes (Signed)
 Complex Care Management Note Care Guide Note  09/09/2024 Name: [name] [name] MRN: [id number] DOB: Jul 25, 1956   Complex Care Management Outreach Attempts: A third unsuccessful outreach was attempted today to offer the patient with information about available complex care management services.  Follow Up Plan:  No further outreach attempts will be made at this time. We have been unable to contact the patient to offer or enroll patient in complex care management services.  Encounter Outcome:  No Answer-Left voicemail  Leotis Rase [facility] Guide  Direct Dial: [phone]  Fax [phone]

## 2024-09-09 NOTE — TOC Transition Note (Signed)
 Transition of Care [facility]) - Discharge Note   Patient Details  Name: [name] [name] MRN: [id number] Date of Birth: [birth year]  Transition of Care [facility]) CM/SW Contact:  Bascom Service, RN Phone Number: 09/09/2024, 11:22 AM   Clinical Narrative:  Declined ST SNF/HHC services. Has good support @ home. Has own transport home. No further CM needs.     Final next level of care: Home/Self Care Barriers to Discharge: No Barriers Identified   Patient Goals and CMS Choice Patient states their goals for this hospitalization and ongoing recovery are:: Home CMS Medicare.gov Compare Post Acute Care list provided to:: Patient Choice offered to / list presented to : Patient      Discharge Placement                       Discharge Plan and Services Additional resources added to the After Visit Summary for                                       Social Drivers of Health (SDOH) Interventions SDOH Screenings   Food Insecurity: No Food Insecurity (09/07/2024)  Housing: Low Risk (09/07/2024)  Transportation Needs: No Transportation Needs (09/07/2024)  Utilities: Not At Risk (09/07/2024)  Depression (PHQ2-9): Low Risk (05/26/2024)  Social Connections: Patient Unable To Answer (09/07/2024)  Tobacco Use: High Risk (09/01/2024)     Readmission Risk Interventions     No data to display

## 2024-09-09 NOTE — Discharge Summary (Addendum)
 Physician Discharge Summary  [name] [name] FMW:[id number] DOB: June 14, 1956 DOA: 09/06/2024  PCP: [name] Carwin, MD  Admit date: 09/06/2024 Discharge date: 09/09/2024  Time spent: 40 minutes  Recommendations for Outpatient Follow-up:  Follow outpatient CBC/CMP  Follow encephalopathy outpatient  Follow pulmonary nodule outpatient - repeat imaging in 3 months Follow right hepatic lobe lesion outpatient with GI Discontinue diabetes meds - follow BG's outpatient Losartan  on hold at discharge, follow blood pressures outpatient and adjust as needed   Discharge Diagnoses:  Principal Problem:   Hepatic encephalopathy (HCC)   Discharge Condition: stable  Diet recommendation: low sodium  Filed Weights   09/06/24 0940  Weight: 111 kg    History of present illness:   [name] [name] is [name] [name] 69 y.o. male with past medical history significant for hepatocellular carcinoma s/p Y90 radioembolization (2025), cirrhosis s/p TIPS, Hepatic encephalopathy, DM2, HTN, thrombocytopenia, GERD who presented to [facility] ED from home via EMS on 09/06/2024 after being found by family with confusion with apparent fall at home.  Last seen on the night of 1/30 by family.  When he was not seen the day after they went to check on him and was found laying on the couch, confused with some blood to his face.  Recently had [name] [name] colonoscopy on 1/27 that went well.  Apparently had been nonadherent with lactulose  since his colonoscopy.    He's been treated for hepatic encephalopathy in the setting of medication nonadherence.  CT imaging showed no acute injury.    He's improved on 2/4, therapy recommending SNF, but he refuses.  Family is willing to help care for him at home.  He also declined home health services.    Hospital Course:  Assessment and Plan:  Hepatic encephalopathy Patient presenting to ED with confusion.  Recently underwent colonoscopy 1/27 and had been nonadherent with his lactulose  since.  Ammonia level  elevated 187 on arrival. -- no asterixis today, [name] [name]&O -- [name] [name]  550 g p.o. twice daily -- Lactulose  20 g p.o. 3 times daily, goal 2-3 soft BMs daily -- Strict I's and O's, monitor bowel movements   Weakness/debility/deconditioning Fall Patient was found by family laying on the couch with some dried blood to his face/nose.  Patient unable to recall events leading to this.  CT head, C-spine, maxillofacial unremarkable. -- PT recommending SNF placement, but he's declined this - planning for home with family support.     Hypothermia: Resolved Upon ED arrival, patient's temperature was noted to be 96.1 F rectally.   Lung nodule Partially calcified 10 mm nodule at left lung apex, needs follow up with CT in 3 months -- Continue outpatient follow-up/surveillance   Hepatocellular carcinoma s/p Y90 radioembolization Cirrhosis s/p TIPS CT Abdo/pelvis with right hypodense hepatic lobe lesion measuring 2.5 x 2.2 cm slightly increased in size from previous MRI. -- CMP in am -- Continue outpatient follow-up with GI, [facility]   DM2 At baseline on glimepiride  40 mg p.o. daily, Tresiba 32 units daily (also reported possibly 25 units daily).   Hemoglobin A1c 5.6%. Receiving minimal sliding scale, BG's generally ok.  I think his glimepiride  and tresiba can be discontinued - will d/c.  Needs outpatient follow up.   CKD stage IIIa -- Cr 1.69>1.61>1.55 (baseline 1.5 - 1.6) -- CMP in am   HTN -- Carvedilol  6.25 mg p.o. twice daily -- Amlodipine  10 mg p.o. daily -- Clonidine  0.3 mg p.o. 3 times daily -- will resume home lasix  40 mg daily at discharge --Hold home  losartan  for now, follow outpatient for resumption   Thrombocytopenia -- 104>88, stable -- CBC in am   GERD Resume home PPI at discharge  Class I Obesity Body mass index is 34.13 kg/m.     Procedures: none   Consultations: none  Discharge Exam: Vitals:   09/08/24 2015 09/09/24 0503  BP: (!) 150/59 (!) 152/61   Pulse: (!) 56 (!) 54  Resp: 18 19  Temp: 98.8 F (37.1 C) 98.2 F (36.8 C)  SpO2:     No complaints Sister at bedside - they're planning on helping him at home  General: No acute distress. Cardiovascular: RRR Lungs: unlabored Abdomen: Soft, nontender, nondistended Neurological: Alert and oriented 3. Moves all extremities 4.  No asterixis.  Cranial nerves II through XII grossly intact. Extremities: No clubbing or cyanosis. No edema.   Discharge Instructions   Discharge Instructions     Call MD for:  difficulty breathing, headache or visual disturbances   Complete by: As directed    Call MD for:  extreme fatigue   Complete by: As directed    Call MD for:  hives   Complete by: As directed    Call MD for:  persistant dizziness or light-headedness   Complete by: As directed    Call MD for:  persistant nausea and vomiting   Complete by: As directed    Call MD for:  redness, tenderness, or signs of infection (pain, swelling, redness, odor or green/yellow discharge around incision site)   Complete by: As directed    Call MD for:  severe uncontrolled pain   Complete by: As directed    Call MD for:  temperature >100.4   Complete by: As directed    Discharge instructions   Complete by: As directed    You were seen for hepatic encephalopathy (confusion related to your cirrhosis).   This was in the setting of missing lactulose  doses after your colonoscopy.  It's extremely important you take your lactulose  every day with [name] [name] goal of having 2-3 bowel movements [name] [name] day.  The [name] [name]  also treats your hepatic encephalopathy.  We recommended rehab, but you requested to go home.  You'll need 24 hr supervision and assistance, it sounds like your family is planning to help with this.  We'll arrange home health.   You have [name] [name] pulmonary nodule which should be followed outpatient with your PCP.  You have [name] [name] right hepatic lobe lesion which is slightly increased in size from the previous MRI and  should be followed with your outpatient liver doctors.   Return for new, recurrent, or worsening symptoms.  Please ask your PCP to request records from this hospitalization so they know what was done and what the next steps will be.   Increase activity slowly   Complete by: As directed       Allergies as of 09/09/2024   No Known Allergies      Medication List     PAUSE taking these medications    losartan  100 MG tablet Wait to take this until your doctor or other care provider tells you to start again. Hold on resumption of your losartan  until you discuss with your PCP outpatient Commonly known as: COZAAR  Take 100 mg by mouth daily.       STOP taking these medications    aspirin EC 81 MG tablet   glimepiride  4 MG tablet Commonly known as: AMARYL    Tresiba FlexTouch 100 UNIT/ML FlexTouch Pen Generic drug: insulin  degludec  TAKE these medications    Accu-Chek Aviva Plus test strip Generic drug: glucose blood 1 each by Other route as needed.   amLODipine  10 MG tablet Commonly known as: NORVASC  Take 1 tablet (10 mg total) by mouth at bedtime.   carvedilol  6.25 MG tablet Commonly known as: COREG  Take 6.25 mg by mouth 2 (two) times daily. One daily   cloNIDine  0.3 MG tablet Commonly known as: CATAPRES  Take 0.3 mg by mouth 3 (three) times daily.   furosemide  40 MG tablet Commonly known as: LASIX  Take 40 mg by mouth daily.   lactulose  10 GM/15ML solution Commonly known as: CHRONULAC  Take 30 mLs (20 g total) by mouth 3 (three) times daily. Goal of 2-3 bowel movements daily What changed:  how much to take when to take this additional instructions   linaclotide  145 MCG Caps capsule Commonly known as: LINZESS  Take 1 capsule (145 mcg total) by mouth daily.   omeprazole 20 MG capsule Commonly known as: PRILOSEC TAKE 1 CAPSULE BY MOUTH EVERY MORNING FOR ACID REFLUX.   OVER THE COUNTER MEDICATION Radiation by Madie.   PRESCRIPTION MEDICATION Eye  injection every 4 months   [name] [name]  550 MG Tabs tablet Commonly known as: XIFAXAN  Take 550 mg by mouth.   Sure Comfort Pen Needles 31G X 8 MM Misc Generic drug: Insulin  Pen Needle   zolpidem 10 MG tablet Commonly known as: AMBIEN Take 10 mg by mouth at bedtime as needed.       Allergies[1]    The results of significant diagnostics from this hospitalization (including imaging, microbiology, ancillary and laboratory) are listed below for reference.    Significant Diagnostic Studies: CT Maxillofacial Wo Contrast Result Date: 09/06/2024 EXAM: CT OF THE FACE WITHOUT CONTRAST 09/06/2024 11:42:49 AM TECHNIQUE: CT of the face was performed without the administration of intravenous contrast. Multiplanar reformatted images are provided for review. Automated exposure control, iterative reconstruction, and/or weight based adjustment of the mA/kV was utilized to reduce the radiation dose to as low as reasonably achievable. COMPARISON: CT of the head dated 10/13/2023. CLINICAL HISTORY: Facial trauma, blunt. FINDINGS: FACIAL BONES: The facial bones are intact. There is no evidence of facial fracture. No mandibular dislocation. No suspicious bone lesion. ORBITS: Globes are intact. No acute traumatic injury. No inflammatory change. SINUSES AND MASTOIDS: There is mucosal disease within the right maxillary sinus with hypertrophy of the wall, suggesting chronic sinus disease. There is small mucosal disease also present within the ethmoid air cells anteriorly. There is mild rightward deviation of the nasoseptum. SOFT TISSUES: No acute abnormality. IMPRESSION: 1. No acute facial fracture. 2. Chronic sinus disease in the right maxillary sinus with hypertrophy of the wall and small mucosal disease in the ethmoid air cells anteriorly. 3. Mild rightward deviation of the nasoseptum. Electronically signed by: Evalene Coho MD 09/06/2024 12:01 PM EST RP Workstation: [id number]   CT CHEST ABDOMEN PELVIS W  CONTRAST Result Date: 09/06/2024 EXAM: CT CHEST, ABDOMEN AND PELVIS WITH CONTRAST 09/06/2024 11:42:49 AM TECHNIQUE: CT of the chest, abdomen and pelvis was performed with the administration of 80 mL of iohexol  (OMNIPAQUE ) 300 MG/ML solution. Multiplanar reformatted images are provided for review. Automated exposure control, iterative reconstruction, and/or weight based adjustment of the mA/kV was utilized to reduce the radiation dose to as low as reasonably achievable. COMPARISON: CT of the chest dated 01/01/2024 and MRI of the abdomen dated 12/05/2023. CLINICAL HISTORY: Polytrauma, blunt. FINDINGS: CHEST: MEDIASTINUM AND LYMPH NODES: Heart and pericardium are unremarkable. The central airways are clear. No  mediastinal, hilar or axillary lymphadenopathy. LUNGS AND PLEURA: Mikeala Girdler partially calcified nodule is present posterolaterally within the left lung apex measuring approximately 10 mm in diameter, similar to the prior study. No focal consolidation or pulmonary edema. No pleural effusion. No pneumothorax. ABDOMEN AND PELVIS: LIVER: The liver demonstrates Adana Marik nodular contour characteristic of cirrhosis. Mirtha Jain hypodense lesion is present within the right hepatic lobe measuring approximately 2.5 x 2.2 cm, slightly increased in size from the previous MRI. There is Tomicka Lover portosystemic shunt. GALLBLADDER AND BILE DUCTS: There are several calcified stones laying dependently within the gallbladder. No biliary ductal dilatation. SPLEEN: There are numerous calcified granulomas within the spleen. PANCREAS: No acute abnormality. ADRENAL GLANDS: No acute abnormality. KIDNEYS, URETERS AND BLADDER: No stones in the kidneys or ureters. No hydronephrosis. No perinephric or periureteral stranding. Urinary bladder is unremarkable. GI AND BOWEL: Stomach demonstrates no acute abnormality. There is no bowel obstruction. REPRODUCTIVE ORGANS: No acute abnormality. PERITONEUM AND RETROPERITONEUM: No ascites. No free air. VASCULATURE: Aorta is normal in  caliber. Within the thoracic aorta, there are degenerative changes. ABDOMINAL AND PELVIS LYMPH NODES: No lymphadenopathy. BONES AND SOFT TISSUES: Degenerative changes throughout the thoracic spine. There is no evidence of fracture. There is Rahsaan Weakland left inguinal fat-containing hernia. No acute osseous abnormality. No focal soft tissue abnormality. No evidence of acute traumatic injury. IMPRESSION: 1. No evidence of acute traumatic injury. 2. Partially calcified 10 mm nodule at the left lung apex, similar to the prior CT; consider further evaluation per Fleischner Society Guidelines (e.g., non-contrast chest CT at 3 months, PET/CT, or tissue sampling). 3. Cirrhosis with portosystemic shunt and Karsyn Jamie hypodense right hepatic lobe lesion measuring approximately 2.5 x 2.2 cm, slightly increased in size from the previous MRI. 4. Cholelithiasis. 5. Left inguinal fat-containing hernia. Electronically signed by: Evalene Coho MD 09/06/2024 11:59 AM EST RP Workstation: [id number]   CT CERVICAL SPINE WO CONTRAST Result Date: 09/06/2024 EXAM: CT CERVICAL SPINE WITHOUT CONTRAST 09/06/2024 11:42:49 AM TECHNIQUE: CT of the cervical spine was performed without the administration of intravenous contrast. Multiplanar reformatted images are provided for review. The study is degraded by patient motion. Automated exposure control, iterative reconstruction, and/or weight based adjustment of the mA/kV was utilized to reduce the radiation dose to as low as reasonably achievable. COMPARISON: None available. CLINICAL HISTORY: Neck trauma, intoxicated or obtunded (Age >= 16y). Neck trauma; intoxicated or obtunded; Age >= 16 years. FINDINGS: BONES AND ALIGNMENT: The cervical vertebrae maintain their height and alignment. There is no evidence of fracture or acute traumatic injury. DEGENERATIVE CHANGES: There is moderate disc space narrowing present at C4-C5 and C6-C7. There are prominent anterior osteophytes at C3 through C7. SOFT TISSUES: No  prevertebral soft tissue swelling. IMPRESSION: 1. No evidence of acute traumatic injury, although the study is motion degraded. 2. Moderate disc space narrowing at C4-5 and C6-7 with prominent anterior osteophytes at C3 through C7. Electronically signed by: Evalene Coho MD 09/06/2024 11:50 AM EST RP Workstation: [id number]   CT Head Wo Contrast Result Date: 09/06/2024 EXAM: CT HEAD WITHOUT CONTRAST 09/06/2024 11:42:49 AM TECHNIQUE: CT of the head was performed without the administration of intravenous contrast. Automated exposure control, iterative reconstruction, and/or weight based adjustment of the mA/kV was utilized to reduce the radiation dose to as low as reasonably achievable. COMPARISON: CT of the head dated 10/13/2023. CLINICAL HISTORY: Head trauma, minor (Age >= 65y). Minor head trauma (age >= 65 years). FINDINGS: BRAIN AND VENTRICLES: Age-related atrophy and mild cerebral white matter disease present. No acute hemorrhage. No  evidence of acute infarct. No hydrocephalus. No extra-axial collection. No mass effect or midline shift. Moderate calcific atheromatous disease within the carotid siphons bilaterally. ORBITS: The patient is status post bilateral lens replacement. SINUSES: Circumferential mucosal disease within the floor of the right maxillary sinus. Adjacent osseous hypertrophic changes suggesting likely chronic sinus disease. Mild ethmoid air cell disease also present anteriorly. SOFT TISSUES AND SKULL: No acute soft tissue abnormality. No skull fracture. IMPRESSION: 1. No acute intracranial abnormality. 2. Age-related atrophy and mild cerebral white matter disease. 3. Moderate calcific atheromatous disease within the carotid siphons bilaterally. 4. Circumferential mucosal disease within the floor of the right maxillary sinus with adjacent osseous hypertrophic changes, suggesting likely chronic sinus disease. Mild ethmoid air cell disease anteriorly. Electronically signed by: Evalene Coho  MD 09/06/2024 11:48 AM EST RP Workstation: [id number]   DG Chest Port 1 View Result Date: 09/06/2024 CLINICAL DATA:  Status post fall. EXAM: PORTABLE CHEST 1 VIEW COMPARISON:  May 02, 2006 FINDINGS: The heart size and mediastinal contours are within normal limits. Both lungs are clear. Multilevel degenerative changes seen throughout the thoracic spine. No acute osseous abnormality is identified. IMPRESSION: No active disease. Electronically Signed   By: Suzen Dials M.D.   On: 09/06/2024 10:07    Microbiology: Recent Results (from the past 240 hours)  Culture, blood (Routine X 2) w Reflex to ID Panel     Status: None (Preliminary result)   Collection Time: 09/06/24  9:51 AM   Specimen: BLOOD  Result Value Ref Range Status   Specimen Description BLOOD  Final   Special Requests NONE  Final   Culture   Final    NO GROWTH 3 DAYS Performed at [facility], [address], [place], [place] [address]    Report Status PENDING  Incomplete  Culture, blood (Routine X 2) w Reflex to ID Panel     Status: None (Preliminary result)   Collection Time: 09/06/24 10:31 AM   Specimen: Peritoneal Washings; Blood  Result Value Ref Range Status   Specimen Description PERITONEAL BOTTLES DRAWN AEROBIC AND ANAEROBIC  Final   Special Requests Blood Culture adequate volume  Final   Culture   Final    NO GROWTH 3 DAYS Performed at [facility], [address] [place], [place] [address]    Report Status PENDING  Incomplete     Labs: Basic Metabolic Panel: Recent Labs  Lab 09/06/24 1031 09/07/24 0456 09/08/24 0558 09/09/24 0610  NA 142 147* 143 139  K 5.5* 4.2 3.9 4.2  CL 108 113* 114* 112*  CO2 20* 23 22 19*  GLUCOSE 99 129* 82 121*  BUN 22 25* 20 19  CREATININE 1.69* 1.61* 1.55* 1.51*  CALCIUM 9.1 8.6* 8.4* 8.2*  MG  --   --   --  2.2  PHOS  --   --   --  2.1*   Liver Function Tests: Recent Labs  Lab 09/06/24 1031 09/07/24 0456 09/08/24 0558 09/09/24 0610  AST 57* 58* 61* 64*   ALT 34 29 28 30   ALKPHOS 216* 157* 144* 155*  BILITOT 2.5* 1.9* 1.4* 1.5*  PROT 7.8 6.4* 5.9* 6.3*  ALBUMIN 3.4* 2.8* 2.7* 2.6*   No results for input(s): LIPASE, AMYLASE in the last 168 hours. Recent Labs  Lab 09/06/24 1031 09/07/24 0456 09/08/24 0558 09/09/24 0610  AMMONIA 178* 139* 111* 164*   CBC: Recent Labs  Lab 09/06/24 1031 09/07/24 0456 09/09/24 0610  WBC 6.4 5.9 3.7*  NEUTROABS 5.1  --   --  HGB 12.1* 11.2* 10.9*  HCT 35.5* 33.1* 33.6*  MCV 97.3 97.9 102.1*  PLT 104* 88* 95*   Cardiac Enzymes: No results for input(s): CKTOTAL, CKMB, CKMBINDEX, TROPONINI in the last 168 hours. BNP: BNP (last 3 results) No results for input(s): BNP in the last 8760 hours.  ProBNP (last 3 results) No results for input(s): PROBNP in the last 8760 hours.  CBG: Recent Labs  Lab 09/08/24 0739 09/08/24 1117 09/08/24 1635 09/08/24 2125 09/09/24 0753  GLUCAP 82 145* 158* 104* 125*       Signed:  Meliton Monte MD.  [facility] 09/09/2024, 10:24 AM       [1] No Known Allergies

## 2024-09-10 ENCOUNTER — Telehealth: Payer: Self-pay

## 2024-09-10 NOTE — Patient Instructions (Signed)
 Visit Information  Thank you for taking time to visit with me today. Please don't hesitate to contact me if I can be of assistance to you.  Please return to the hospital for Severe symptoms: Severe confusion or disorientation Extreme drowsiness (stupor) Inappropriate or erratic behavior A flapping tremor in the hands and arms Coma    The patient verbalized understanding of instructions, educational materials, and care plan provided today and DECLINED offer to receive copy of patient instructions, educational materials, and care plan.   The patient has been provided with contact information for the care management team and has been advised to call with any health related questions or concerns.   Please call the care guide team at [phone] if you need to cancel or reschedule your appointment.   Please call the Suicide and Crisis Lifeline: 988 if you are experiencing a Mental Health or Behavioral Health Crisis or need someone to talk to.  Cayton Cuevas J. Valiant Dills RN, MSN [facility], [facility] RN Care Manager Direct Dial: [phone]  Fax: [phone] Website: [email or web address]

## 2024-09-10 NOTE — Transitions of Care (Post Inpatient/ED Visit) (Signed)
 "  09/10/2024  Name: [name] [name] MRN: [id number] DOB: 1956-01-28  Today's TOC FU Call Status: Today's TOC FU Call Status:: Successful TOC FU Call Completed TOC FU Call Complete Date: 09/10/24  Patient's Name and Date of Birth confirmed. Name, DOB  Transition Care Management Follow-up Telephone Call Date of Discharge: 09/09/24 Discharge Facility: [facility]) Type of Discharge: Inpatient Admission Primary Inpatient Discharge Diagnosis:: Hepatic encephalopathy How have you been since you were released from the hospital?: Better Any questions or concerns?: No  Items Reviewed: Did you receive and understand the discharge instructions provided?: Yes Medications obtained,verified, and reconciled?: Partial Review Completed Reason for Partial Mediation Review: sister does medication-patient only able to report a couple of medications Dietary orders reviewed?: Yes Type of Diet Ordered:: Heart Healthy carb modified Do you have support at home?: Yes People in Home [RPT]: alone Name of Support/Comfort Primary Source: Sister Patsy supports patient  Medications Reviewed Today: Medications Reviewed Today     Reviewed by Jackelyne Sayer, RN (Case Manager) on 09/10/24 at 1314  Med List Status: <None>   Medication Order Taking? Sig Documenting Provider Last Dose Status Informant  ACCU-CHEK AVIVA PLUS test strip [id number]  1 each by Other route as needed. [provider]  Active   amLODipine  (NORVASC ) 10 MG tablet [id number]  Take 1 tablet (10 mg total) by mouth at bedtime. Richarda Prentice LITTIE Mickey., NP  Active   carvedilol  (COREG ) 6.25 MG tablet [id number]  Take 6.25 mg by mouth 2 (two) times daily. One daily [provider]  Active Family Member  cloNIDine  (CATAPRES ) 0.3 MG tablet [id number]  Take 0.3 mg by mouth 3 (three) times daily. [provider]  Active   furosemide  (LASIX ) 40 MG tablet [id number]  Take 40 mg by mouth daily. [provider]  Active    lactulose  (CHRONULAC ) 10 GM/15ML solution [id number] Yes Take 30 mLs (20 g total) by mouth 3 (three) times daily. Goal of 2-3 bowel movements daily Perri DELENA Meliton Mickey., MD  Active   linaclotide  (LINZESS ) 145 MCG CAPS capsule [id number]  Take 1 capsule (145 mcg total) by mouth daily.  Patient not taking: Reported on 07/27/2024   Cinderella Deatrice FALCON, MD  Expired 08/17/24 2359            Med Note (CHAMPION, MONTIE CHRISTELLA   Sun Sep 06, 2024  3:25 PM) prn  losartan  (COZAAR ) 100 MG tablet [id number]  Take 100 mg by mouth daily. [provider]  Active   omeprazole (PRILOSEC) 20 MG capsule [id number]  TAKE 1 CAPSULE BY MOUTH EVERY MORNING FOR ACID REFLUX. Golda Claudis PENNER, MD  Active Family Member  OVER THE COUNTER MEDICATION [id number]  Radiation by [facility]. [provider]  Active   PRESCRIPTION MEDICATION [id number]  Eye injection every 4 months [provider]  Active   rifaximin  (XIFAXAN ) 550 MG TABS tablet [id number]  Take 550 mg by mouth. [provider]  Active   SURE COMFORT PEN NEEDLES 31G X 8 MM MISC [id number]   [provider]  Active   zolpidem (AMBIEN) 10 MG tablet [id number]  Take 10 mg by mouth at bedtime as needed. [provider]  Active            Med Note WILBURT, MONTIE CHRISTELLA Repress Sep 06, 2024  3:29 PM) prn  Med List Note Durenda Madelin CHRISTELLA, [place] 96/93/79 9069):              Home Care and Equipment/Supplies:  Were Home Health Services Ordered?: NA Any new equipment or medical supplies ordered?: NA  Functional Questionnaire: Do you need assistance with bathing/showering or dressing?: No Do you need assistance with meal preparation?: No Do you need assistance with eating?: No Do you have difficulty maintaining continence: No Do you need assistance with getting out of bed/getting out of a chair/moving?: No Do you have difficulty managing or taking your medications?: No  Follow up appointments reviewed: PCP Follow-up appointment  confirmed?: Yes Date of PCP follow-up appointment?: 09/11/24 Follow-up Provider: Dr. Sheryle Specialist [facility] Follow-up appointment confirmed?: NA Do you need transportation to your follow-up appointment?: No Do you understand care options if your condition(s) worsen?: Yes-patient verbalized understanding  SDOH Interventions Today    Flowsheet Row Most Recent Value  SDOH Interventions   Food Insecurity Interventions Intervention Not Indicated  Housing Interventions Intervention Not Indicated  Transportation Interventions Intervention Not Indicated  Utilities Interventions Intervention Not Indicated   Discussed and offered 30 day TOC program.  Patient  declined.  The patient has been provided with contact information for the care management team and has been advised to call with any health -related questions or concerns.  The patient verbalized understanding with current plan of care.  The patient is directed to their insurance card regarding availability of benefits coverage.   Mada Sadik J. Ersie Savino RN, MSN [facility], [facility] RN Care Manager Direct Dial: [phone]  Fax: [phone] Website: [email or web address]   "

## 2024-09-11 LAB — CULTURE, BLOOD (ROUTINE X 2)
Culture: NO GROWTH
Culture: NO GROWTH
Special Requests: ADEQUATE

## 2024-09-16 ENCOUNTER — Encounter (INDEPENDENT_AMBULATORY_CARE_PROVIDER_SITE_OTHER): Admitting: Ophthalmology
